# Patient Record
Sex: Female | Born: 1940
Health system: Southern US, Community
[De-identification: ages and names within clinical notes are randomized; demographics above are authoritative.]

## PROBLEM LIST (undated history)

## (undated) DIAGNOSIS — K760 Fatty (change of) liver, not elsewhere classified: Secondary | ICD-10-CM

## (undated) DIAGNOSIS — R270 Ataxia, unspecified: Secondary | ICD-10-CM

## (undated) DIAGNOSIS — I10 Essential (primary) hypertension: Secondary | ICD-10-CM

## (undated) DIAGNOSIS — K5790 Diverticulosis of intestine, part unspecified, without perforation or abscess without bleeding: Secondary | ICD-10-CM

## (undated) DIAGNOSIS — R269 Unspecified abnormalities of gait and mobility: Secondary | ICD-10-CM

## (undated) DIAGNOSIS — N281 Cyst of kidney, acquired: Secondary | ICD-10-CM

## (undated) DIAGNOSIS — E785 Hyperlipidemia, unspecified: Secondary | ICD-10-CM

## (undated) DIAGNOSIS — K219 Gastro-esophageal reflux disease without esophagitis: Secondary | ICD-10-CM

## (undated) DIAGNOSIS — M8580 Other specified disorders of bone density and structure, unspecified site: Secondary | ICD-10-CM

## (undated) DIAGNOSIS — Z9181 History of falling: Secondary | ICD-10-CM

## (undated) DIAGNOSIS — H5509 Other forms of nystagmus: Secondary | ICD-10-CM

## (undated) DIAGNOSIS — T7840XA Allergy, unspecified, initial encounter: Secondary | ICD-10-CM

## (undated) DIAGNOSIS — H269 Unspecified cataract: Secondary | ICD-10-CM

## (undated) DIAGNOSIS — D509 Iron deficiency anemia, unspecified: Secondary | ICD-10-CM

## (undated) HISTORY — DX: Unspecified cataract: H26.9

## (undated) HISTORY — DX: Iron deficiency anemia, unspecified: D50.9

## (undated) HISTORY — PX: CATARACT EXTRACTION: SUR2

## (undated) HISTORY — DX: Unspecified abnormalities of gait and mobility: R26.9

## (undated) HISTORY — DX: Essential (primary) hypertension: I10

## (undated) HISTORY — DX: History of falling: Z91.81

## (undated) HISTORY — DX: Ataxia, unspecified: R27.0

## (undated) HISTORY — DX: Gastro-esophageal reflux disease without esophagitis: K21.9

## (undated) HISTORY — DX: Fatty (change of) liver, not elsewhere classified: K76.0

## (undated) HISTORY — PX: TONSILLECTOMY: SUR1361

## (undated) HISTORY — DX: Cyst of kidney, acquired: N28.1

## (undated) HISTORY — DX: Other forms of nystagmus: H55.09

## (undated) HISTORY — DX: Other specified disorders of bone density and structure, unspecified site: M85.80

## (undated) HISTORY — DX: Allergy, unspecified, initial encounter: T78.40XA

## (undated) HISTORY — DX: Hyperlipidemia, unspecified: E78.5

## (undated) HISTORY — PX: HERNIA REPAIR: SHX51

## (undated) HISTORY — DX: Diverticulosis of intestine, part unspecified, without perforation or abscess without bleeding: K57.90

## (undated) HISTORY — PX: EYE SURGERY: SHX253

---

## 1969-07-22 HISTORY — PX: BREAST BIOPSY: SHX20

## 1999-02-23 ENCOUNTER — Ambulatory Visit (HOSPITAL_COMMUNITY): Admission: RE | Admit: 1999-02-23 | Discharge: 1999-02-23 | Payer: Self-pay | Admitting: Internal Medicine

## 1999-10-31 ENCOUNTER — Emergency Department (HOSPITAL_COMMUNITY): Admission: EM | Admit: 1999-10-31 | Discharge: 1999-10-31 | Payer: Self-pay | Admitting: Emergency Medicine

## 1999-12-09 ENCOUNTER — Other Ambulatory Visit: Admission: RE | Admit: 1999-12-09 | Discharge: 1999-12-09 | Payer: Self-pay | Admitting: Gynecology

## 2000-02-17 ENCOUNTER — Ambulatory Visit (HOSPITAL_COMMUNITY): Admission: RE | Admit: 2000-02-17 | Discharge: 2000-02-17 | Payer: Self-pay | Admitting: Orthopedic Surgery

## 2000-02-17 ENCOUNTER — Encounter: Payer: Self-pay | Admitting: Orthopedic Surgery

## 2001-02-14 ENCOUNTER — Ambulatory Visit (HOSPITAL_COMMUNITY): Admission: RE | Admit: 2001-02-14 | Discharge: 2001-02-14 | Payer: Self-pay | Admitting: Internal Medicine

## 2001-02-14 ENCOUNTER — Encounter: Payer: Self-pay | Admitting: Internal Medicine

## 2001-03-21 ENCOUNTER — Encounter: Payer: Self-pay | Admitting: Urology

## 2001-03-21 ENCOUNTER — Ambulatory Visit (HOSPITAL_COMMUNITY): Admission: RE | Admit: 2001-03-21 | Discharge: 2001-03-21 | Payer: Self-pay | Admitting: Urology

## 2002-12-28 ENCOUNTER — Encounter: Payer: Self-pay | Admitting: Internal Medicine

## 2002-12-28 ENCOUNTER — Encounter: Admission: RE | Admit: 2002-12-28 | Discharge: 2002-12-28 | Payer: Self-pay | Admitting: Internal Medicine

## 2003-01-23 ENCOUNTER — Other Ambulatory Visit: Admission: RE | Admit: 2003-01-23 | Discharge: 2003-01-23 | Payer: Self-pay | Admitting: Gynecology

## 2003-11-06 ENCOUNTER — Encounter: Admission: RE | Admit: 2003-11-06 | Discharge: 2003-11-06 | Payer: Self-pay | Admitting: Internal Medicine

## 2003-12-29 ENCOUNTER — Ambulatory Visit (HOSPITAL_COMMUNITY): Admission: RE | Admit: 2003-12-29 | Discharge: 2003-12-29 | Payer: Self-pay | Admitting: Internal Medicine

## 2004-01-13 ENCOUNTER — Emergency Department (HOSPITAL_COMMUNITY): Admission: EM | Admit: 2004-01-13 | Discharge: 2004-01-13 | Payer: Self-pay | Admitting: Emergency Medicine

## 2004-01-27 ENCOUNTER — Other Ambulatory Visit: Admission: RE | Admit: 2004-01-27 | Discharge: 2004-01-27 | Payer: Self-pay | Admitting: Gynecology

## 2004-01-28 ENCOUNTER — Other Ambulatory Visit: Admission: RE | Admit: 2004-01-28 | Discharge: 2004-01-28 | Payer: Self-pay | Admitting: Gynecology

## 2004-03-29 ENCOUNTER — Ambulatory Visit (HOSPITAL_COMMUNITY): Admission: RE | Admit: 2004-03-29 | Discharge: 2004-03-29 | Payer: Self-pay | Admitting: Internal Medicine

## 2004-03-30 ENCOUNTER — Encounter: Payer: Self-pay | Admitting: Internal Medicine

## 2004-08-09 ENCOUNTER — Other Ambulatory Visit: Admission: RE | Admit: 2004-08-09 | Discharge: 2004-08-09 | Payer: Self-pay | Admitting: Gynecology

## 2005-02-03 ENCOUNTER — Other Ambulatory Visit: Admission: RE | Admit: 2005-02-03 | Discharge: 2005-02-03 | Payer: Self-pay | Admitting: Gynecology

## 2005-04-05 ENCOUNTER — Ambulatory Visit: Payer: Self-pay | Admitting: Internal Medicine

## 2005-04-12 ENCOUNTER — Ambulatory Visit: Payer: Self-pay | Admitting: Internal Medicine

## 2005-08-02 ENCOUNTER — Ambulatory Visit: Payer: Self-pay | Admitting: Internal Medicine

## 2005-08-17 ENCOUNTER — Ambulatory Visit (HOSPITAL_COMMUNITY): Admission: RE | Admit: 2005-08-17 | Discharge: 2005-08-17 | Payer: Self-pay | Admitting: Internal Medicine

## 2005-09-13 ENCOUNTER — Ambulatory Visit: Payer: Self-pay | Admitting: Internal Medicine

## 2005-09-27 ENCOUNTER — Ambulatory Visit: Payer: Self-pay | Admitting: Internal Medicine

## 2006-02-19 ENCOUNTER — Encounter: Payer: Self-pay | Admitting: Internal Medicine

## 2006-02-19 LAB — CONVERTED CEMR LAB

## 2006-02-27 ENCOUNTER — Other Ambulatory Visit: Admission: RE | Admit: 2006-02-27 | Discharge: 2006-02-27 | Payer: Self-pay | Admitting: Gynecology

## 2006-04-14 ENCOUNTER — Ambulatory Visit: Payer: Self-pay | Admitting: Internal Medicine

## 2006-04-24 ENCOUNTER — Ambulatory Visit: Payer: Self-pay | Admitting: Internal Medicine

## 2006-04-26 ENCOUNTER — Ambulatory Visit: Payer: Self-pay | Admitting: Internal Medicine

## 2006-11-22 ENCOUNTER — Ambulatory Visit: Payer: Self-pay | Admitting: Internal Medicine

## 2006-11-29 ENCOUNTER — Ambulatory Visit: Payer: Self-pay | Admitting: Internal Medicine

## 2006-12-16 ENCOUNTER — Ambulatory Visit: Payer: Self-pay | Admitting: Family Medicine

## 2007-01-24 LAB — CONVERTED CEMR LAB: Pap Smear: NORMAL

## 2007-02-22 ENCOUNTER — Ambulatory Visit: Payer: Self-pay | Admitting: Internal Medicine

## 2007-06-16 ENCOUNTER — Encounter: Payer: Self-pay | Admitting: Internal Medicine

## 2007-06-16 DIAGNOSIS — K219 Gastro-esophageal reflux disease without esophagitis: Secondary | ICD-10-CM | POA: Insufficient documentation

## 2007-06-16 DIAGNOSIS — D509 Iron deficiency anemia, unspecified: Secondary | ICD-10-CM | POA: Insufficient documentation

## 2007-06-16 DIAGNOSIS — M949 Disorder of cartilage, unspecified: Secondary | ICD-10-CM

## 2007-06-16 DIAGNOSIS — J45909 Unspecified asthma, uncomplicated: Secondary | ICD-10-CM | POA: Insufficient documentation

## 2007-06-16 DIAGNOSIS — M899 Disorder of bone, unspecified: Secondary | ICD-10-CM | POA: Insufficient documentation

## 2007-06-18 ENCOUNTER — Ambulatory Visit: Payer: Self-pay | Admitting: Internal Medicine

## 2007-09-19 ENCOUNTER — Ambulatory Visit: Payer: Self-pay | Admitting: Internal Medicine

## 2007-10-16 ENCOUNTER — Ambulatory Visit: Payer: Self-pay | Admitting: Internal Medicine

## 2007-10-16 LAB — CONVERTED CEMR LAB
ALT: 20 units/L (ref 0–35)
AST: 27 units/L (ref 0–37)
Albumin: 4 g/dL (ref 3.5–5.2)
Alkaline Phosphatase: 47 units/L (ref 39–117)
BUN: 10 mg/dL (ref 6–23)
Bacteria, UA: NEGATIVE
Basophils Absolute: 0.1 10*3/uL (ref 0.0–0.1)
Basophils Relative: 0.9 % (ref 0.0–1.0)
Bilirubin Urine: NEGATIVE
Bilirubin, Direct: 0.2 mg/dL (ref 0.0–0.3)
CO2: 28 meq/L (ref 19–32)
Calcium: 9.5 mg/dL (ref 8.4–10.5)
Chloride: 106 meq/L (ref 96–112)
Cholesterol: 192 mg/dL (ref 0–200)
Creatinine, Ser: 0.8 mg/dL (ref 0.4–1.2)
Crystals: NEGATIVE
Eosinophils Absolute: 0.1 10*3/uL (ref 0.0–0.6)
Eosinophils Relative: 1.6 % (ref 0.0–5.0)
GFR calc Af Amer: 93 mL/min
GFR calc non Af Amer: 77 mL/min
Glucose, Bld: 91 mg/dL (ref 70–99)
HCT: 39.4 % (ref 36.0–46.0)
HDL: 66.8 mg/dL (ref >39.0)
Hemoglobin: 13.5 g/dL (ref 12.0–15.0)
Ketones, ur: NEGATIVE mg/dL
LDL Cholesterol: 115 mg/dL — ABNORMAL HIGH (ref 0–99)
Leukocytes, UA: NEGATIVE
Lymphocytes Relative: 25.1 % (ref 12.0–46.0)
MCHC: 34.1 g/dL (ref 30.0–36.0)
MCV: 94.7 fL (ref 78.0–100.0)
Monocytes Absolute: 0.5 10*3/uL (ref 0.2–0.7)
Monocytes Relative: 8.8 % (ref 3.0–11.0)
Mucus, UA: NEGATIVE
Neutro Abs: 3.6 10*3/uL (ref 1.4–7.7)
Neutrophils Relative %: 63.6 % (ref 43.0–77.0)
Nitrite: NEGATIVE
Platelets: 281 10*3/uL (ref 150–400)
Potassium: 4.1 meq/L (ref 3.5–5.1)
RBC: 4.17 M/uL (ref 3.87–5.11)
RDW: 12.5 % (ref 11.5–14.6)
Sodium: 142 meq/L (ref 135–145)
Specific Gravity, Urine: 1.02 (ref 1.000–1.03)
TSH: 2.06 microintl units/mL (ref 0.35–5.50)
Total Bilirubin: 1.8 mg/dL — ABNORMAL HIGH (ref 0.3–1.2)
Total CHOL/HDL Ratio: 2.9
Total Protein, Urine: NEGATIVE mg/dL
Total Protein: 6.9 g/dL (ref 6.0–8.3)
Triglycerides: 53 mg/dL (ref 0–149)
Urine Glucose: NEGATIVE mg/dL
Urobilinogen, UA: 0.2 (ref 0.0–1.0)
VLDL: 11 mg/dL (ref 0–40)
WBC: 5.7 10*3/uL (ref 4.5–10.5)
pH: 6 (ref 5.0–8.0)

## 2007-10-17 ENCOUNTER — Encounter: Payer: Self-pay | Admitting: Internal Medicine

## 2007-10-23 ENCOUNTER — Ambulatory Visit: Payer: Self-pay | Admitting: Internal Medicine

## 2007-12-26 ENCOUNTER — Ambulatory Visit: Payer: Self-pay | Admitting: Internal Medicine

## 2007-12-26 ENCOUNTER — Telehealth: Payer: Self-pay | Admitting: Internal Medicine

## 2007-12-26 LAB — CONVERTED CEMR LAB
Bilirubin Urine: NEGATIVE
Crystals: NEGATIVE
Ketones, ur: NEGATIVE mg/dL
Mucus, UA: NEGATIVE
Nitrite: NEGATIVE
Specific Gravity, Urine: >=1.03 (ref 1.000–1.03)
Total Protein, Urine: NEGATIVE mg/dL
Urine Glucose: NEGATIVE mg/dL
Urobilinogen, UA: 0.2 (ref 0.0–1.0)
pH: 5 (ref 5.0–8.0)

## 2008-01-15 ENCOUNTER — Telehealth: Payer: Self-pay | Admitting: Internal Medicine

## 2008-01-18 ENCOUNTER — Ambulatory Visit: Payer: Self-pay | Admitting: Internal Medicine

## 2008-01-22 ENCOUNTER — Ambulatory Visit: Payer: Self-pay | Admitting: Internal Medicine

## 2008-01-22 ENCOUNTER — Encounter: Payer: Self-pay | Admitting: Internal Medicine

## 2008-01-30 LAB — CONVERTED CEMR LAB: Pap Smear: NORMAL

## 2008-06-10 ENCOUNTER — Telehealth: Payer: Self-pay | Admitting: Internal Medicine

## 2008-06-12 ENCOUNTER — Ambulatory Visit (HOSPITAL_COMMUNITY): Admission: RE | Admit: 2008-06-12 | Discharge: 2008-06-12 | Payer: Self-pay | Admitting: Internal Medicine

## 2008-06-13 ENCOUNTER — Telehealth: Payer: Self-pay | Admitting: Internal Medicine

## 2008-06-26 ENCOUNTER — Encounter: Payer: Self-pay | Admitting: Internal Medicine

## 2008-07-07 ENCOUNTER — Encounter: Payer: Self-pay | Admitting: Internal Medicine

## 2008-08-14 DIAGNOSIS — N281 Cyst of kidney, acquired: Secondary | ICD-10-CM | POA: Insufficient documentation

## 2008-08-14 DIAGNOSIS — K5730 Diverticulosis of large intestine without perforation or abscess without bleeding: Secondary | ICD-10-CM | POA: Insufficient documentation

## 2008-08-19 ENCOUNTER — Ambulatory Visit: Payer: Self-pay | Admitting: Internal Medicine

## 2008-08-26 ENCOUNTER — Telehealth: Payer: Self-pay | Admitting: Internal Medicine

## 2008-08-27 ENCOUNTER — Encounter: Payer: Self-pay | Admitting: Internal Medicine

## 2008-08-27 ENCOUNTER — Ambulatory Visit: Payer: Self-pay | Admitting: Internal Medicine

## 2008-09-11 ENCOUNTER — Ambulatory Visit: Payer: Self-pay | Admitting: Internal Medicine

## 2008-10-05 ENCOUNTER — Telehealth: Payer: Self-pay | Admitting: Internal Medicine

## 2008-10-09 ENCOUNTER — Emergency Department (HOSPITAL_COMMUNITY): Admission: EM | Admit: 2008-10-09 | Discharge: 2008-10-09 | Payer: Self-pay | Admitting: *Deleted

## 2008-10-21 ENCOUNTER — Ambulatory Visit: Payer: Self-pay | Admitting: Internal Medicine

## 2008-10-21 DIAGNOSIS — J438 Other emphysema: Secondary | ICD-10-CM | POA: Insufficient documentation

## 2008-10-21 DIAGNOSIS — M5412 Radiculopathy, cervical region: Secondary | ICD-10-CM | POA: Insufficient documentation

## 2008-10-28 ENCOUNTER — Encounter: Admission: RE | Admit: 2008-10-28 | Discharge: 2008-10-28 | Payer: Self-pay | Admitting: Internal Medicine

## 2008-11-03 ENCOUNTER — Encounter: Payer: Self-pay | Admitting: Internal Medicine

## 2008-11-03 ENCOUNTER — Telehealth: Payer: Self-pay | Admitting: Internal Medicine

## 2008-11-17 ENCOUNTER — Ambulatory Visit: Payer: Self-pay | Admitting: Internal Medicine

## 2008-11-17 DIAGNOSIS — R279 Unspecified lack of coordination: Secondary | ICD-10-CM | POA: Insufficient documentation

## 2008-11-21 HISTORY — PX: CERVICAL SPINE SURGERY: SHX589

## 2008-11-26 ENCOUNTER — Telehealth: Payer: Self-pay | Admitting: Internal Medicine

## 2008-11-26 ENCOUNTER — Encounter: Payer: Self-pay | Admitting: Internal Medicine

## 2008-11-26 LAB — CONVERTED CEMR LAB
Cholesterol: 205 mg/dL — ABNORMAL HIGH (ref 0–200)
HDL: 79 mg/dL (ref >39)
LDL Cholesterol: 114 mg/dL — ABNORMAL HIGH (ref 0–99)
Total CHOL/HDL Ratio: 2.6
Triglycerides: 61 mg/dL (ref <150)
VLDL: 12 mg/dL (ref 0–40)

## 2008-11-27 ENCOUNTER — Encounter: Payer: Self-pay | Admitting: Internal Medicine

## 2008-11-28 ENCOUNTER — Inpatient Hospital Stay (HOSPITAL_COMMUNITY): Admission: RE | Admit: 2008-11-28 | Discharge: 2008-11-30 | Payer: Self-pay | Admitting: Neurosurgery

## 2008-12-25 ENCOUNTER — Encounter: Payer: Self-pay | Admitting: Internal Medicine

## 2009-01-07 ENCOUNTER — Encounter (INDEPENDENT_AMBULATORY_CARE_PROVIDER_SITE_OTHER): Payer: Self-pay | Admitting: *Deleted

## 2009-01-20 ENCOUNTER — Encounter: Payer: Self-pay | Admitting: Internal Medicine

## 2009-02-25 ENCOUNTER — Ambulatory Visit: Payer: Self-pay | Admitting: Internal Medicine

## 2009-03-12 ENCOUNTER — Ambulatory Visit: Payer: Self-pay | Admitting: Internal Medicine

## 2009-04-16 ENCOUNTER — Telehealth (INDEPENDENT_AMBULATORY_CARE_PROVIDER_SITE_OTHER): Payer: Self-pay | Admitting: *Deleted

## 2009-04-16 ENCOUNTER — Ambulatory Visit: Payer: Self-pay | Admitting: Internal Medicine

## 2009-04-16 DIAGNOSIS — L03818 Cellulitis of other sites: Secondary | ICD-10-CM

## 2009-04-16 DIAGNOSIS — L02818 Cutaneous abscess of other sites: Secondary | ICD-10-CM | POA: Insufficient documentation

## 2009-04-30 ENCOUNTER — Ambulatory Visit: Payer: Self-pay | Admitting: Internal Medicine

## 2009-05-04 ENCOUNTER — Encounter: Payer: Self-pay | Admitting: Internal Medicine

## 2009-05-28 ENCOUNTER — Ambulatory Visit: Payer: Self-pay | Admitting: Internal Medicine

## 2009-08-18 ENCOUNTER — Ambulatory Visit: Payer: Self-pay | Admitting: Internal Medicine

## 2009-08-18 DIAGNOSIS — M25529 Pain in unspecified elbow: Secondary | ICD-10-CM | POA: Insufficient documentation

## 2009-08-18 LAB — CONVERTED CEMR LAB
Basophils Absolute: 0 10*3/uL (ref 0.0–0.1)
Basophils Relative: 0.5 % (ref 0.0–3.0)
Eosinophils Absolute: 0.1 10*3/uL (ref 0.0–0.7)
Eosinophils Relative: 1.6 % (ref 0.0–5.0)
HCT: 38.9 % (ref 36.0–46.0)
Hemoglobin: 13.3 g/dL (ref 12.0–15.0)
Lymphocytes Relative: 27 % (ref 12.0–46.0)
Lymphs Abs: 1.5 10*3/uL (ref 0.7–4.0)
MCHC: 34.1 g/dL (ref 30.0–36.0)
MCV: 96.3 fL (ref 78.0–100.0)
Monocytes Absolute: 0.4 10*3/uL (ref 0.1–1.0)
Monocytes Relative: 6.9 % (ref 3.0–12.0)
Neutro Abs: 3.7 10*3/uL (ref 1.4–7.7)
Neutrophils Relative %: 64 % (ref 43.0–77.0)
Platelets: 236 10*3/uL (ref 150.0–400.0)
RBC: 4.04 M/uL (ref 3.87–5.11)
RDW: 12.1 % (ref 11.5–14.6)
Uric Acid, Serum: 4.6 mg/dL (ref 2.4–7.0)
WBC: 5.7 10*3/uL (ref 4.5–10.5)

## 2009-08-19 ENCOUNTER — Telehealth (INDEPENDENT_AMBULATORY_CARE_PROVIDER_SITE_OTHER): Payer: Self-pay | Admitting: *Deleted

## 2009-12-03 ENCOUNTER — Ambulatory Visit: Payer: Self-pay | Admitting: Internal Medicine

## 2010-01-29 ENCOUNTER — Encounter: Payer: Self-pay | Admitting: Internal Medicine

## 2010-03-11 ENCOUNTER — Telehealth: Payer: Self-pay | Admitting: Internal Medicine

## 2010-03-19 ENCOUNTER — Ambulatory Visit: Payer: Self-pay | Admitting: Internal Medicine

## 2010-03-19 DIAGNOSIS — M25559 Pain in unspecified hip: Secondary | ICD-10-CM | POA: Insufficient documentation

## 2010-03-19 DIAGNOSIS — L84 Corns and callosities: Secondary | ICD-10-CM | POA: Insufficient documentation

## 2010-08-26 ENCOUNTER — Telehealth: Payer: Self-pay | Admitting: Internal Medicine

## 2010-09-01 ENCOUNTER — Telehealth: Payer: Self-pay | Admitting: Internal Medicine

## 2010-09-07 ENCOUNTER — Telehealth (INDEPENDENT_AMBULATORY_CARE_PROVIDER_SITE_OTHER): Payer: Self-pay | Admitting: *Deleted

## 2010-09-09 ENCOUNTER — Ambulatory Visit: Payer: Self-pay | Admitting: Internal Medicine

## 2010-09-21 ENCOUNTER — Ambulatory Visit: Payer: Self-pay | Admitting: Internal Medicine

## 2010-09-21 ENCOUNTER — Encounter: Payer: Self-pay | Admitting: Internal Medicine

## 2010-10-05 ENCOUNTER — Encounter: Payer: Self-pay | Admitting: Internal Medicine

## 2010-11-23 ENCOUNTER — Ambulatory Visit
Admission: RE | Admit: 2010-11-23 | Discharge: 2010-11-23 | Payer: Self-pay | Source: Home / Self Care | Attending: Internal Medicine | Admitting: Internal Medicine

## 2010-12-07 ENCOUNTER — Encounter: Payer: Self-pay | Admitting: Internal Medicine

## 2010-12-07 ENCOUNTER — Other Ambulatory Visit: Payer: Self-pay | Admitting: Internal Medicine

## 2010-12-07 ENCOUNTER — Ambulatory Visit
Admission: RE | Admit: 2010-12-07 | Discharge: 2010-12-07 | Payer: Self-pay | Source: Home / Self Care | Attending: Internal Medicine | Admitting: Internal Medicine

## 2010-12-07 DIAGNOSIS — R252 Cramp and spasm: Secondary | ICD-10-CM | POA: Insufficient documentation

## 2010-12-07 LAB — CBC WITH DIFFERENTIAL/PLATELET
Basophils Absolute: 0 10*3/uL (ref 0.0–0.1)
Basophils Relative: 0.4 % (ref 0.0–3.0)
Eosinophils Absolute: 0.1 10*3/uL (ref 0.0–0.7)
Eosinophils Relative: 1 % (ref 0.0–5.0)
HCT: 38 % (ref 36.0–46.0)
Hemoglobin: 13.3 g/dL (ref 12.0–15.0)
Lymphocytes Relative: 20.9 % (ref 12.0–46.0)
Lymphs Abs: 1.3 10*3/uL (ref 0.7–4.0)
MCHC: 34.9 g/dL (ref 30.0–36.0)
MCV: 94.6 fl (ref 78.0–100.0)
Monocytes Absolute: 0.6 10*3/uL (ref 0.1–1.0)
Monocytes Relative: 9.1 % (ref 3.0–12.0)
Neutro Abs: 4.3 10*3/uL (ref 1.4–7.7)
Neutrophils Relative %: 68.6 % (ref 43.0–77.0)
Platelets: 261 10*3/uL (ref 150.0–400.0)
RBC: 4.02 Mil/uL (ref 3.87–5.11)
RDW: 13.9 % (ref 11.5–14.6)
WBC: 6.3 10*3/uL (ref 4.5–10.5)

## 2010-12-07 LAB — LDL CHOLESTEROL, DIRECT: Direct LDL: 129.3 mg/dL

## 2010-12-07 LAB — LIPID PANEL
Cholesterol: 244 mg/dL — ABNORMAL HIGH (ref 0–200)
HDL: 97.1 mg/dL (ref >39.00)
Total CHOL/HDL Ratio: 3
Triglycerides: 142 mg/dL (ref 0.0–149.0)
VLDL: 28.4 mg/dL (ref 0.0–40.0)

## 2010-12-07 LAB — BASIC METABOLIC PANEL
BUN: 14 mg/dL (ref 6–23)
CO2: 30 mEq/L (ref 19–32)
Calcium: 9.5 mg/dL (ref 8.4–10.5)
Chloride: 103 mEq/L (ref 96–112)
Creatinine, Ser: 0.7 mg/dL (ref 0.4–1.2)
GFR: 94.49 mL/min (ref >60.00)
Glucose, Bld: 107 mg/dL — ABNORMAL HIGH (ref 70–99)
Potassium: 3.8 mEq/L (ref 3.5–5.1)
Sodium: 141 mEq/L (ref 135–145)

## 2010-12-07 LAB — MAGNESIUM: Magnesium: 2.1 mg/dL (ref 1.5–2.5)

## 2010-12-21 NOTE — Assessment & Plan Note (Signed)
Summary: cpx / medicare / # / cd   Vital Signs:  Patient profile:   70 year old female Height:      60 inches Weight:      95 pounds BMI:     18.62 O2 Sat:      98 % on Room air Temp:     97.8 degrees F oral Pulse rate:   74 / minute BP sitting:   140 / 90  (left arm) Cuff size:   regular  Vitals Entered By: Bill Salinas CMA (December 03, 2009 10:23 AM)  O2 Flow:  Room air CC: pt here for yearly check up. She is due for tetanus and has never had zostavax injection. Pt's last bone density screening was oct 2009 which showed osteopenia and recommended follow up bone density in oct 2011. Pt's last eye exam was Feb 2010 and she is sch for an eye exam Dec 29, 2009 with Dr [name] Sanes ab   Primary Care Provider:  Jacques Navy MD  CC:  pt here for yearly check up. She is due for tetanus and has never had zostavax injection. Pt's last bone density screening was oct 2009 which showed osteopenia and recommended follow up bone density in oct 2011. Pt's last eye exam was Feb 2010 and she is sch for an eye exam feb 8 and 2011 with Dr [name] Sanes ab.  History of Present Illness: Patient prsents for routine follow-up. She has been great. She has had her first grandson-born Nov '10. She is current with mammography, current with pelvic exam and had a PAP in '09, last colonoscopy '10.  Current Medications (verified): 1)  Alendronate Sodium 70 Mg Tabs (Alendronate Sodium) .Marland Kitchen.. 1 Weekly 2)  Femhrt Low Dose 0.5-2.5 Mg-Mcg  Tabs (Norethindrone-Eth Estradiol) .... Once Daily 3)  Albuterol 90 Mcg/act  Aers (Albuterol) .... As Needed 4)  Centrum Silver  Tabs (Multiple Vitamins-Minerals) .... Once Daily 5)  Aspirin Ec Low Dose 81 Mg  Tbec (Aspirin) .Marland Kitchen.. 1 Once Daily 6)  Caltrate 600+d 600-400 Mg-Unit  Tabs (Calcium Carbonate-Vitamin D) .... Two Times A Day 7)  Omeprazole 40 Mg Cpdr (Omeprazole) .... Take 1 Tablet By Mouth Once A Day  Allergies (verified): 1)  * Ct Dye  Past History:  Past Surgical  History: Tonsillectomy Breast biopsies- both benign C-spine surgery '10 [facility])  Family History: Reviewed history from 08/14/2008 and no changes required. father-died 14, colon cancer, CAD/MI mother -died 68, pancreatic cancer,  Paternal aunt, paternal GM with colon cancer paternal aunt - breast cancer maternal aunt with ovarian cancer Family History of Heart Disease: Father Family History of Ovarian Cancer: Maternal Aunt  Social History: Reviewed history from 08/14/2008 and no changes required. 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 married - 1965 3 sons - '66, '69, '70 Son in good health Patient has never smoked.  Alcohol Use - no Daily Caffeine Use-2-3 daily Illicit Drug Use - no  Review of Systems  The patient denies anorexia, fever, weight loss, weight gain, vision loss, decreased hearing, chest pain, syncope, peripheral edema, prolonged cough, hemoptysis, abdominal pain, hematochezia, hematuria, muscle weakness, suspicious skin lesions, difficulty walking, depression, abnormal bleeding, and angioedema.    Physical Exam  General:  slender white female in no distress Head:  Normocephalic and atraumatic without obvious abnormalities. No apparent alopecia or balding. Eyes:  No corneal or conjunctival inflammation noted. EOMI. Perrla. Funduscopic exam benign, without hemorrhages, exudates or papilledema. Vision grossly normal. Ears:  External ear  exam shows no significant lesions or deformities.  Otoscopic examination reveals clear canals, tympanic membranes are intact bilaterally without bulging, retraction, inflammation or discharge. Hearing is grossly normal bilaterally. Nose:  no external deformity and no external erythema.   Mouth:  Oral mucosa and oropharynx without lesions or exudates.  Teeth in good repair. Neck:  full ROM, no thyromegaly, and no carotid bruits.   Breasts:  deferred Lungs:  normal respiratory effort, normal breath sounds, no  crackles, and no wheezes.   Heart:  Normal rate and regular rhythm. S1 and S2 normal without gallop, murmur, click, rub or other extra sounds. Abdomen:  soft, non-tender, normal bowel sounds, no distention, no guarding, no rebound tenderness, and no hepatomegaly.   Rectal:  deferred Msk:  normal ROM, no joint tenderness, no joint swelling, and no joint deformities.   Pulses:  2+ radial and DP pulses Extremities:  No clubbing, cyanosis, edema, or deformity noted with normal full range of motion of all joints.   Neurologic:  alert & oriented X3, cranial nerves II-XII intact, strength normal in all extremities, gait normal, and DTRs symmetrical and normal.   Skin:  turgor normal, color normal, no rashes, no petechiae, no ulcerations, and no edema.  Well healed anterior cervical surgical scar. Cervical Nodes:  no anterior cervical adenopathy and no posterior cervical adenopathy.   Psych:  Oriented X3, memory intact for recent and remote, normally interactive, good eye contact, and not anxious appearing.     Impression & Recommendations:  Problem # 1:  ATAXIA (ICD-781.3) Mild persistent symptoms that have improved. No further eval at this time  Problem # 2:  EMPHYSEMA (ICD-492.8) Stable without symptoms. More of a radiographic finding than physical finding. No further work-up or treatment indicated.  Problem # 3:  OSTEOPENIA (ICD-733.90) Due for follow-up DXA in October '11. Will continue alendronate.  Her updated medication list for this problem includes:    Alendronate Sodium 70 Mg Tabs (Alendronate sodium) .Marland Kitchen... 1 weekly  Problem # 4:  GERD (ICD-530.81) Well controlled by omeprazole.  Her updated medication list for this problem includes:    Omeprazole 40 Mg Cpdr (Omeprazole) .Marland Kitchen... Take 1 tablet by mouth once a day  Problem # 5:  ANEMIA-IRON DEFICIENCY (ICD-280.9) Last Hemoglobin 08/18/09 13.3g = normal. No further eval required.  Problem # 6:  Preventive Health Care  (ICD-V70.0) Unremarkable history, having made a full recovery from her DDD cervical spine with surgery. She is feeling well. Normal physical exam. Chart reviewed - she had a normal lipid panel in the last 18 months and odes not need repeat for 2-3 years, Uric acid and blood count in September'10 and stable chemistries, thus no lab ordered today. She is due for DXA as noted. She will schedule her own mammogram. She is current with colorectal cancer screening. Immunization agains flu and pneumonia are up to date.  In summary - a very nice new grandmother who is medical stable and doing well. She will return for DXA in October and to see me in 1 year or as needed.   Complete Medication List: 1)  Alendronate Sodium 70 Mg Tabs (Alendronate sodium) .Marland KitchenMarland KitchenMarland Kitchen 1 weekly 2)  Femhrt Low Dose 0.5-2.5 Mg-mcg Tabs (Norethindrone-eth estradiol) .... Once daily 3)  Albuterol 90 Mcg/act Aers (Albuterol) .... As needed 4)  Centrum Silver Tabs (Multiple vitamins-minerals) .... Once daily 5)  Aspirin Ec Low Dose 81 Mg Tbec (Aspirin) .Marland Kitchen.. 1 once daily 6)  Caltrate 600+d 600-400 Mg-unit Tabs (Calcium carbonate-vitamin d) .... Two  times a day 7)  Omeprazole 40 Mg Cpdr (Omeprazole) .... Take 1 tablet by mouth once a day   Preventive Care Screening  Mammogram:    Date:  12/31/2008    Results:  normal bilateral   Bone Density:    Date:  08/27/2008    Results:  abnormal std dev  Pap Smear:    Date:  01/30/2008    Results:  normal    Preventive Care Screening  Mammogram:    Date:  12/31/2008    Results:  normal bilateral   Bone Density:    Date:  08/27/2008    Results:  abnormal std dev  Pap Smear:    Date:  01/30/2008    Results:  normal

## 2010-12-21 NOTE — Letter (Signed)
Summary: [facility]  [facility]   Imported By: Lester [place] 05/19/2009 09:10:50  _____________________________________________________________________  External Attachment:    Type:   Image     Comment:   External Document

## 2010-12-21 NOTE — Progress Notes (Signed)
Summary: BONE DENSITY  Phone Note Call from Patient   Summary of Call: Patient is requesting order for bone density, ok per DR, pt scheduled for tomorrow  Initial call taken by: Lamar Sprinkles,  August 26, 2008 5:06 PM

## 2010-12-21 NOTE — Assessment & Plan Note (Signed)
Summary: 1 mos f/u $50 / cd   Vital Signs:  Patient profile:   70 year old female Height:      60 inches Weight:      90 pounds BMI:     17.64 O2 Sat:      98 % on Room air Temp:     97.7 degrees F oral Pulse rate:   78 / minute Pulse rhythm:   regular BP sitting:   126 / 82  (left arm) Cuff size:   regular  Vitals Entered By: Rock Nephew CMA (May 28, 2009 10:22 AM)  O2 Flow:  Room air  Primary Care Provider:  Jacques Navy MD   History of Present Illness: she returns for followup. On June 20 of this year she had one episode of nausea and vomiting after taking rifampin. She ended up completing a 10 day course and has had no abdominal symptoms since then. All of her rash and boils have resolved. She feels well today and offers no complaints.  Current Medications (verified): 1)  Alendronate Sodium 70 Mg Tabs (Alendronate Sodium) .Marland Kitchen.. 1 Weekly 2)  Femhrt Low Dose 0.5-2.5 Mg-Mcg  Tabs (Norethindrone-Eth Estradiol) .... Once Daily 3)  Albuterol 90 Mcg/act  Aers (Albuterol) .... As Needed 4)  Centrum Silver  Tabs (Multiple Vitamins-Minerals) .... Once Daily 5)  Aspirin Ec Low Dose 81 Mg  Tbec (Aspirin) .Marland Kitchen.. 1 Once Daily 6)  Caltrate 600+d 600-400 Mg-Unit  Tabs (Calcium Carbonate-Vitamin D) .... Two Times A Day 7)  Omeprazole 40 Mg Cpdr (Omeprazole) .... Take 1 Tablet By Mouth Once A Day  Allergies (verified): 1)  * Ct Dye  Past History:  Past Medical History: Reviewed history from 11/17/2008 and no changes required. ATAXIA (ICD-781.3) CERVICAL RADICULOPATHY, RIGHT (ICD-723.4) EMPHYSEMA (ICD-492.8) DIVERTICULOSIS OF COLON (ICD-562.10) RENAL CYST (ICD-593.2) OSTEOPENIA (ICD-733.90) GERD (ICD-530.81) ASTHMA (ICD-493.90) ANEMIA-IRON DEFICIENCY (ICD-280.9)  Past Surgical History: Reviewed history from 08/14/2008 and no changes required. Tonsillectomy Breast biopsies- both benign  Family History: Reviewed history from 08/14/2008 and no changes  required. father-died 63, colon cancer, CAD/MI mother -died 44, pancreatic cancer,  Paternal aunt, paternal GM with colon cancer paternal aunt - breast cancer maternal aunt with ovarian cancer Family History of Heart Disease: Father Family History of Ovarian Cancer: Maternal Aunt  Social History: Reviewed history from 08/14/2008 and no changes required. 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 married - 1965 3 sons - '66, '69, '70 Son in good health Patient has never smoked.  Alcohol Use - no Daily Caffeine Use-2-3 daily Illicit Drug Use - no  Review of Systems  The patient denies anorexia, fever, weight loss, abdominal pain, suspicious skin lesions, and enlarged lymph nodes.    Physical Exam  General:  alert, well-developed, well-nourished, and well-hydrated.   Neck:  supple, full ROM, and no masses.   Lungs:  Normal respiratory effort, chest expands symmetrically. Lungs are clear to auscultation, no crackles or wheezes. Heart:  Normal rate and regular rhythm. S1 and S2 normal without gallop, murmur, click, rub or other extra sounds. Abdomen:  soft, non-tender, normal bowel sounds, no distention, no masses, no hepatomegaly, and no splenomegaly.   Msk:  No deformity or scoliosis noted of thoracic or lumbar spine.   Skin:  no rashes, no suspicious lesions, excessive tan, and solar damage.   Cervical Nodes:  No lymphadenopathy noted Psych:  Cognition and judgment appear intact. Alert and cooperative with normal attention span and concentration. No apparent delusions,  illusions, hallucinations   Impression & Recommendations:  Problem # 1:  CELLULITIS AND ABSCESS OF OTHER SPECIFIED SITE [phone]) Assessment Improved  The following medications were removed from the medication list:    Rifampin 150 Mg Caps (Rifampin) ..... One by mouth two times a day for 10 days    Bactrim Ds 800-160 Mg Tabs (Sulfamethoxazole-trimethoprim) ..... One by mouth two times a  day for 10 days  Complete Medication List: 1)  Alendronate Sodium 70 Mg Tabs (Alendronate sodium) .Marland KitchenMarland KitchenMarland Kitchen 1 weekly 2)  Femhrt Low Dose 0.5-2.5 Mg-mcg Tabs (Norethindrone-eth estradiol) .... Once daily 3)  Albuterol 90 Mcg/act Aers (Albuterol) .... As needed 4)  Centrum Silver Tabs (Multiple vitamins-minerals) .... Once daily 5)  Aspirin Ec Low Dose 81 Mg Tbec (Aspirin) .Marland Kitchen.. 1 once daily 6)  Caltrate 600+d 600-400 Mg-unit Tabs (Calcium carbonate-vitamin d) .... Two times a day 7)  Omeprazole 40 Mg Cpdr (Omeprazole) .... Take 1 tablet by mouth once a day  Patient Instructions: 1)  Please schedule a follow-up appointment as needed.

## 2010-12-21 NOTE — Assessment & Plan Note (Signed)
Summary: PER PT ER/10/09/08-R SHOULDER PAIN-$50---STC   Vital Signs:  Patient Profile:   70 Years Old Female Height:     60 inches Weight:      99 pounds Temp:     97.8 degrees F oral Pulse rate:   70 / minute BP sitting:   150 / 90  (left arm) Cuff size:   regular  Vitals Entered By: Zackery Barefoot CMA (October 21, 2008 4:33 PM)                 PCP:  [facility]  Chief Complaint:  ER follow up .  History of Present Illness: Patient seen in ER 11/19 or an episode of acute pain in the right shoulder while on the commode, accompanied by sweating and radiation of pain down the arm. Her eval was reviewed: labs were normal, cardiac enzymes were negative, x-ray of shoulder was normal, C-Spine series with DJD and DDD and question of right sided foraminal compromise C5-6. Chest-x-ray with severe emphysema. She does recall two episodes that preceeded this event: climbing into a dog lot and taking a fall. She does admit to having decreased grip strength right hand.     Prior Medications Reviewed Using: Patient Recall  Updated Prior Medication List: FOSAMAX 70 MG  TABS (ALENDRONATE SODIUM) 1 weekly FEMHRT LOW DOSE 0.5-2.5 MG-MCG  TABS (NORETHINDRONE-ETH ESTRADIOL) once daily ALBUTEROL 90 MCG/ACT  AERS (ALBUTEROL) as needed CENTRUM SILVER  TABS (MULTIPLE VITAMINS-MINERALS) once daily ASPIRIN EC LOW DOSE 81 MG  TBEC (ASPIRIN) 1 once daily CALTRATE 600+D 600-400 MG-UNIT  TABS (CALCIUM CARBONATE-VITAMIN D) two times a day OMEPRAZOLE 40 MG CPDR (OMEPRAZOLE) Take 1 tablet by mouth once a day VITAMIN D 400 UNIT TABS (CHOLECALCIFEROL) Take 1 tablet by mouth once a day  Current Allergies (reviewed today): * CT DYE  Past Medical History:    Reviewed history from 10/23/2007 and no changes required:       Anemia-iron deficiency       Asthma as a child       GERD       Osteopenia with osteoporosis of the femoral neck       Renal cyst       ataxia-peripherally mediated        emphysema  Past Surgical History:    Reviewed history from 08/14/2008 and no changes required:       Tonsillectomy       Breast biopsies- both benign   Family History:    Reviewed history from 08/14/2008 and no changes required:       father-died 71, colon cancer, CAD/MI       mother -died 35, pancreatic cancer,        Paternal aunt, paternal GM with colon cancer       paternal aunt - breast cancer       maternal aunt with ovarian cancer       Family History of Heart Disease: Father       Family History of Ovarian Cancer: Maternal Aunt  Social History:    Reviewed history from 08/14/2008 and no changes required:       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       married - 1965       3 sons - '66, '69, '70       Son in good health       Patient has never smoked.  Alcohol Use - no       Daily Caffeine Use-2-3 daily       Illicit Drug Use - no    Review of Systems  The patient denies anorexia, fever, weight loss, weight gain, decreased hearing, hoarseness, chest pain, dyspnea on exertion, prolonged cough, abdominal pain, incontinence, and unusual weight change.         c/o decreased grip strength right hand   Physical Exam  General:     Thin but well nourished and strong white female Head:     Normocephalic and atraumatic without obvious abnormalities. No apparent alopecia or balding. Lungs:     normal respiratory effort.   Heart:     normal rate.   Neurologic:     decreased grip strength right vs left, decreased pinch strenght right vs left. Normal DTRs UE, decreased sensation to light touch and pin-prick distal index and third fingers right hand.    Impression & Recommendations:  Problem # 1:  CERVICAL RADICULOPATHY, RIGHT (ICD-723.4) patient with episode of severe right shoulder/arm pain now better. However C-spine suggested foraminal narrowing at C5-6. She does report weakness with right grip and this is found to be the case  on physical exam. suspect nerve compression syndrom c5-6 right.  Plan: MRI cervical spine.  Orders: Radiology Referral (Radiology)   Problem # 2:  CHEST XRAY, ABNORMAL (ICD-793.1) Chest x-ray read out as showing emphysema. Patient without symptoms. Offered PFTs but she doesn't feel this is needed at this time.  Complete Medication List: 1)  Fosamax 70 Mg Tabs (Alendronate sodium) .Marland KitchenMarland KitchenMarland Kitchen 1 weekly 2)  Femhrt Low Dose 0.5-2.5 Mg-mcg Tabs (Norethindrone-eth estradiol) .... Once daily 3)  Albuterol 90 Mcg/act Aers (Albuterol) .... As needed 4)  Centrum Silver Tabs (Multiple vitamins-minerals) .... Once daily 5)  Aspirin Ec Low Dose 81 Mg Tbec (Aspirin) .Marland Kitchen.. 1 once daily 6)  Caltrate 600+d 600-400 Mg-unit Tabs (Calcium carbonate-vitamin d) .... Two times a day 7)  Omeprazole 40 Mg Cpdr (Omeprazole) .... Take 1 tablet by mouth once a day 8)  Vitamin D 400 Unit Tabs (Cholecalciferol) .... Take 1 tablet by mouth once a day    ]

## 2010-12-21 NOTE — Procedures (Signed)
Summary: COLON   Colonoscopy  Procedure date:  03/30/2004  Findings:      Location:  [facility].    Procedures Next Due Date:    Colonoscopy: 03/2009  Patient Name: Kristine Francis, Kristine Francis MRN:  Procedure Procedures: Colonoscopy CPT: 16109.  Personnel: Endoscopist: Marchelle Rinella L. Juanda Chance, MD.  Exam Location: Exam performed in Outpatient Clinic. Outpatient  Patient Consent: Procedure, Alternatives, Risks and Benefits discussed, consent obtained, from patient. Consent was obtained by the RN.  Indications  Evaluation of: Anemia with low iron saturation.  Increased Risk Screening: For family history of colorectal neoplasia, in  parent  History  Current Medications: Patient is not currently taking Coumadin.  Pre-Exam Physical: Performed Mar 30, 2004. Cardio-pulmonary exam, Rectal exam, HEENT exam , Abdominal exam, Extremity exam, Neurological exam, Mental status exam WNL.  Exam Exam: Extent of exam reached: Cecum, extent intended: Cecum.  The cecum was identified by appendiceal orifice and IC valve. Colon retroflexion performed. Images taken. ASA Classification: I. Tolerance: good.  Monitoring: Pulse and BP monitoring, Oximetry used. Supplemental O2 given.  Colon Prep Used Miralax for colon prep. Prep results: good.  Sedation Meds: Patient assessed and found to be appropriate for moderate (conscious) sedation. Fentanyl 100 mcg. given IV. Versed 10 mg. given IV.  Findings - OTHER FINDING: tortuous redundant colon found in Descending Colon. Comments: difficult exam.   Assessment Normal examination.  Comments: no polyps Events  Unplanned Interventions: No intervention was required.  Unplanned Events: There were no complications. Plans Medication Plan: Iron supplemets: 1 QD, starting Mar 30, 2004   Patient Education: Patient given standard instructions for: Yearly hemoccult testing recommended. Patient instructed to get routine colonoscopy every 5 years.    Disposition: After procedure patient sent to recovery. After recovery patient sent home.    This report was created from the original endoscopy report, which was reviewed and signed by the above listed endoscopist.

## 2010-12-21 NOTE — Progress Notes (Signed)
  Phone Note Outgoing Call   Summary of Call: DEXA scan reveals osteopenia. She may continue to take medication although it is thought reasonable to treat mild osteopenia with calcium and vit D. alone. If she has questions we can discuss at an OV Initial call taken by: Jacques Navy MD,  October 05, 2008 6:00 PM  Follow-up for Phone Call        Pt informed of above, next appointment 12/09 will discuss same then. Follow-up by: Zackery Barefoot CMA,  October 07, 2008 6:47 PM    New/Updated Medications: VITAMIN D 400 UNIT TABS (CHOLECALCIFEROL) Take 1 tablet by mouth once a day

## 2010-12-21 NOTE — Progress Notes (Signed)
Summary: bone density  Phone Note Call from Patient Call back at Home Phone [phone]   Caller: Patient Summary of Call: Patient called [facility] stating that she had a bone density 23yrs ago and need to know if time to schedule. If so she would like orders for this. Please advise Thanks Initial call taken by: Rock Nephew CMA,  September 07, 2010 1:24 PM  Follow-up for Phone Call        if it has been 2 years and one day we can scedule DXA scan 733.0 Follow-up by: Jacques Navy MD,  September 07, 2010 1:25 PM  Additional Follow-up for Phone Call Additional follow up Details #1::        Appt sched for 10/25 for DXA scan. Additional Follow-up by: Verdell Face,  September 08, 2010 1:51 PM

## 2010-12-21 NOTE — Progress Notes (Signed)
Summary: MAIL ORDER MEDS  Phone Note Refill Request   Refills Requested: Medication #1:  FOSAMAX 70 MG  TABS 1 weekly   Supply Requested: 3 months Pt will pick up, call when ready  Initial call taken by: Lamar Sprinkles,  January 15, 2008 1:42 PM  Follow-up for Phone Call        [facility] Follow-up by: Jacques Navy MD,  January 15, 2008 4:01 PM  Additional Follow-up for Phone Call Additional follow up Details #1::        [facility] to pick up Additional Follow-up by: Rock Nephew CMA,  January 16, 2008 11:24 AM      Prescriptions: FOSAMAX 70 MG  TABS (ALENDRONATE SODIUM) 1 weekly  #12 x 3   Entered by:   Lamar Sprinkles   Authorized by:   Jacques Navy MD   Signed by:   Lamar Sprinkles on 01/15/2008   Method used:   Print then Give to Patient   RxID:   [id number]

## 2010-12-21 NOTE — Assessment & Plan Note (Signed)
Summary: ELBOW AND WRIST INFECTION /NWS   Vital Signs:  Patient profile:   70 year old female Height:      60 inches Weight:      91 pounds BMI:     17.84 O2 Sat:      98 % on Room air Temp:     96.8 degrees F oral Pulse rate:   71 / minute BP sitting:   158 / 92  (left arm) Cuff size:   regular  Vitals Entered By: Bill Salinas CMA (August 18, 2009 9:39 AM)  O2 Flow:  Room air CC: Pt here with complaint of spots on wrists' and knees as well as her right elbow/ pt is due for TD Booster, pneumovax, and flu but wants to discuss her current symptoms / ab   Primary Care Provider:  Jacques Navy MD  CC:  Pt here with complaint of spots on wrists' and knees as well as her right elbow/ pt is due for TD Booster, pneumovax, and and flu but wants to discuss her current symptoms / ab.  History of Present Illness: Patient was seen in May for cellulitis of the left elbow treated with TMP/SMX DS. She returned with a small boil on the buttock leading to another round of Bactrim plus rifampin for 10 days with complete resolution of the infection.   She has been released by Dr. Jeral Fruit - she did very well with cerivcal micro-diskectomy with complete resolution of arm pain. she does continue to do neck exercises.   Patient presents today with recurrent pain in the left elbow pain that has subsided. She feels the elbow is slightly swollen. there is good range of motion.   Current Medications (verified): 1)  Alendronate Sodium 70 Mg Tabs (Alendronate Sodium) .Marland Kitchen.. 1 Weekly 2)  Femhrt Low Dose 0.5-2.5 Mg-Mcg  Tabs (Norethindrone-Eth Estradiol) .... Once Daily 3)  Albuterol 90 Mcg/act  Aers (Albuterol) .... As Needed 4)  Centrum Silver  Tabs (Multiple Vitamins-Minerals) .... Once Daily 5)  Aspirin Ec Low Dose 81 Mg  Tbec (Aspirin) .Marland Kitchen.. 1 Once Daily 6)  Caltrate 600+d 600-400 Mg-Unit  Tabs (Calcium Carbonate-Vitamin D) .... Two Times A Day 7)  Omeprazole 40 Mg Cpdr (Omeprazole) .... Take 1 Tablet  By Mouth Once A Day  Allergies (verified): 1)  * Ct Dye PMH-FH-SH reviewed for relevance  Review of Systems  The patient denies anorexia, fever, weight loss, decreased hearing, chest pain, syncope, dyspnea on exertion, abdominal pain, muscle weakness, suspicious skin lesions, depression, and enlarged lymph nodes.    Physical Exam  General:  Well-developed,well-nourished,in no acute distress; alert,appropriate and cooperative throughout examination Msk:  left elbow without erythema, appreciable swelling. there is full ROM. No fluctuance. Skin:  isolated erthematous lesions: right wrist, right distal LE, question of lesion left wrist.   Biopsy site right temple.   Impression & Recommendations:  Problem # 1:  ELBOW PAIN, LEFT (ICD-719.42)  recurrent left elbow pain. Question of arthritic inflammation vs possible gout.   Plan  uric acid level         for pain: NSAID  Orders: TLB-CBC Platelet - w/Differential (85025-CBCD) TLB-Uric Acid, Blood (84550-URIC)  Complete Medication List: 1)  Alendronate Sodium 70 Mg Tabs (Alendronate sodium) .Marland KitchenMarland KitchenMarland Kitchen 1 weekly 2)  Femhrt Low Dose 0.5-2.5 Mg-mcg Tabs (Norethindrone-eth estradiol) .... Once daily 3)  Albuterol 90 Mcg/act Aers (Albuterol) .... As needed 4)  Centrum Silver Tabs (Multiple vitamins-minerals) .... Once daily 5)  Aspirin Ec Low Dose 81 Mg Tbec (  Aspirin) .Marland Kitchen.. 1 once daily 6)  Caltrate 600+d 600-400 Mg-unit Tabs (Calcium carbonate-vitamin d) .... Two times a day 7)  Omeprazole 40 Mg Cpdr (Omeprazole) .... Take 1 tablet by mouth once a day  Other Orders: Pneumococcal Vaccine (84696) Admin 1st Vaccine (29528) Flu Vaccine 61yrs + (41324) Administration Flu vaccine - MCR (M0102)  Patient Instructions: 1)  elbow pain - no evidence of infection. Suspect either an arthritic flare of pain vs mild bursitis (inflammation) vs gout. Plan - check the uric acid level, causative agent for gout. Use non-steroidal anti-inflammaotry drug, i.le.  aspirin or aleve. 2)  Skin lesions - NOT SHINGLES. May be sand flea bite or other insect bite.    Preventive Care Screening  Mammogram:    Date:  11/30/2008    Results:  normal   Bone Density:    Date:  12/22/2006    Results:  normal std dev    Immunizations Administered:  Pneumonia Vaccine:    Vaccine Type: Pneumovax    Site: right deltoid    Mfr: Merck    Dose: 0.5 ml    Route: IM    Given by: Ami Bullins CMA    Exp. Date: 11/10/2010    Lot #: [id number]    VIS given: 06/18/96 version given August 18, 2009.    Flu Vaccine Consent Questions     Do you have a history of severe allergic reactions to this vaccine? no    Any prior history of allergic reactions to egg and/or gelatin? no    Do you have a sensitivity to the preservative Thimersol? no    Do you have a past history of Guillan-Barre Syndrome? no    Do you currently have an acute febrile illness? no    Have you ever had a severe reaction to latex? no    Vaccine information given and explained to patient? yes    Are you currently pregnant? no    Lot Number:[id number]   Exp Date:05/20/2010   Site Given  Left Deltoid IMflu

## 2010-12-21 NOTE — Miscellaneous (Signed)
Summary: RECALL COLON/YF  Clinical Lists Changes  Medications: Added new medication of MIRALAX   POWD (POLYETHYLENE GLYCOL 3350) As per prep  instructions. - Signed Added new medication of DULCOLAX 5 MG  TBEC (BISACODYL) Day before procedure take 2 at 3pm and 2 at 8pm. - Signed Rx of MIRALAX   POWD (POLYETHYLENE GLYCOL 3350) As per prep  instructions.;  #255gm x 0;  Signed;  Entered by: Darlyn Read RN;  Authorized by: Hart Carwin;  Method used: Electronically to [facility] [address] [id number]*, [address], [place], [place]  [address], Ph: [phone], Fax: [phone] Rx of DULCOLAX 5 MG  TBEC (BISACODYL) Day before procedure take 2 at 3pm and 2 at 8pm.;  #4 x 0;  Signed;  Entered by: Darlyn Read RN;  Authorized by: Hart Carwin;  Method used: Electronically to [facility] [id number]*, [address] [place], [place]  [address], Ph: [phone], Fax: [phone]    Prescriptions: DULCOLAX 5 MG  TBEC (BISACODYL) Day before procedure take 2 at 3pm and 2 at 8pm.  #4 x 0   Entered by:   Darlyn Read RN   Authorized by:   Hart Carwin   Signed by:   Darlyn Read RN on 02/25/2009   Method used:   Electronically to        [facility] [address] #[id number]* (retail)       [address] [place], [place]  [address]       Ph: [phone]       Fax: [phone]   RxID:   [id number] MIRALAX   POWD (POLYETHYLENE GLYCOL 3350) As per prep  instructions.  #255gm x 0   Entered by:   Darlyn Read RN   Authorized by:   Hart Carwin   Signed by:   Darlyn Read RN on 02/25/2009   Method used:   Electronically to        [facility] [address] #[id number]* (retail)       [address] [place], [place]  [address]       Ph: [phone]       Fax: [phone]   RxID:   [id number]

## 2010-12-21 NOTE — Assessment & Plan Note (Signed)
Summary: flu shot/men/pn   Nurse Visit    Prior Medications: FOSAMAX 70 MG  TABS (ALENDRONATE SODIUM) 1 weekly FEMHRT LOW DOSE 0.5-2.5 MG-MCG  TABS (NORETHINDRONE-ETH ESTRADIOL) once daily ALBUTEROL 90 MCG/ACT  AERS (ALBUTEROL) as needed CENTRUM SILVER  TABS (MULTIPLE VITAMINS-MINERALS) once daily ASPIRIN EC LOW DOSE 81 MG  TBEC (ASPIRIN) 1 once daily CALTRATE 600+D 600-400 MG-UNIT  TABS (CALCIUM CARBONATE-VITAMIN D) two times a day OMEPRAZOLE 40 MG CPDR (OMEPRAZOLE) two times a day Current Allergies: * CT DYE    Orders Added: 1)  Flu Vaccine 25yrs + [57846] 2)  Administration Flu vaccine [G0008]    ]         Flu Vaccine Consent Questions     Do you have a history of severe allergic reactions to this vaccine? no    Any prior history of allergic reactions to egg and/or gelatin? no    Do you have a sensitivity to the preservative Thimersol? no    Do you have a past history of Guillan-Barre Syndrome? no    Do you currently have an acute febrile illness? no    Have you ever had a severe reaction to latex? no    Vaccine information given and explained to patient? yes    Are you currently pregnant? no    Lot Number:[id number]   Exp Date:05/20/2009   Site Given  Left Deltoid IMu

## 2010-12-21 NOTE — Letter (Signed)
   [facility] [address] [place], [place]  [address] Phone: [phone]      October 07, 2010   Venie Stantz [address] [place], [place] [address]  RE:  LAB RESULTS  Dear  Ms. Hardt,  The following is an interpretation of your most recent lab tests.  Please take note of any instructions provided or changes to medications that have resulted from your lab work.   Bone density study reveals a normal spine; osteopenia at both left and right hips. Overall risk of fracture in the next 10 years is 17.7% (nl 14.2%)); risk of hip fracture over the next 10 years is 4.9% (nl 3.1%)   Recommend - continuing alendronate. Follow-up study in 2 years.    Sincerely Yours,    Jacques Navy MD

## 2010-12-21 NOTE — Miscellaneous (Signed)
Summary: BONE DENSITY  Clinical Lists Changes  Orders: Added new Test order of T-Bone Densitometry (77080) - Signed Added new Test order of T-Lumbar Vertebral Assessment (77082) - Signed 

## 2010-12-21 NOTE — Progress Notes (Signed)
Summary: PAIN MED  Phone Note Call from Patient Call back at Home Phone [phone]   Summary of Call: Pt says that on phone today with Dr Debby Bud she was told that he would call in pain med and prednisone.  Initial call taken by: Lamar Sprinkles,  November 03, 2008 2:01 PM  Follow-up for Phone Call        [facility] 2 meds below per Dr, attempted to contact pt, # busy, will try later Follow-up by: Lamar Sprinkles,  November 03, 2008 2:27 PM  Additional Follow-up for Phone Call Additional follow up Details #1::        Pt informed of rxs, Dr Jeral Fruit said it should be ok to wait until after first of year due to upcoming holidays, depending on how meds for pain work for her.  Additional Follow-up by: Lamar Sprinkles,  November 03, 2008 5:28 PM    Additional Follow-up for Phone Call Additional follow up Details #2::    prednisone 40 x 3 , 30 x 3, 20 x 3, 10 x 6; vicodin 7.5/750 q 6 Follow-up by: Jacques Navy MD,  November 03, 2008 6:08 PM  New/Updated Medications: PREDNISONE 10 MG TABS (PREDNISONE) 4 tabs once daily x 3 days then 3 tabs once daily x 3 days then 2 tabs once daily x 3 days then 1 tab once daily x 6 days VICODIN ES 7.5-750 MG TABS (HYDROCODONE-ACETAMINOPHEN) 1 q 6 hrs as needed pain   Prescriptions: VICODIN ES 7.5-750 MG TABS (HYDROCODONE-ACETAMINOPHEN) 1 q 6 hrs as needed pain  #30 x 1   Entered by:   Lamar Sprinkles   Authorized by:   Jacques Navy MD   Signed by:   Lamar Sprinkles on 11/03/2008   Method used:   Telephoned to ...       [facility] #[id number]* (retail)       [address] [place], [place]  [address]       Ph: [phone]       Fax: [phone]   RxID:   [id number] PREDNISONE 10 MG TABS (PREDNISONE) 4 tabs once daily x 3 days then 3 tabs once daily x 3 days then 2 tabs once daily x 3 days then 1 tab once daily x 6 days  #qs x 0   Entered by:   Lamar Sprinkles   Authorized by:   Jacques Navy MD   Signed by:   Lamar Sprinkles on  11/03/2008   Method used:   Telephoned to ...       [facility] #[id number]* (retail)       [address] [place], [place]  [address]       Ph: [phone]       Fax: [phone]   RxID:   [id number]

## 2010-12-21 NOTE — Progress Notes (Signed)
Summary: FYI surgery  Phone Note Call from Patient   Summary of Call: pt called and wants to let Dr. Debby Bud know that the time for her neck surgery at [facility] has changed............... it will still be friday 11/28/08 but it is now at 9:40 am .....Marland Kitchenshe wanted to let you know because you stated you would be coming to the hospital to check on her Initial call taken by: Windell Norfolk,  November 26, 2008 2:06 PM  Follow-up for Phone Call         noted.  Follow-up by: Jacques Navy MD,  November 26, 2008 9:23 PM

## 2010-12-21 NOTE — Assessment & Plan Note (Signed)
Summary: ANNUAL CPX/AWARE OF FEES/NML   Vital Signs:  Patient Profile:   70 Years Old Female Height:     60 inches Weight:      103 pounds Temp:     97.9 degrees F oral Pulse rate:   73 / minute BP sitting:   132 / 77  (left arm) Cuff size:   regular  Vitals Entered By: Zackery Barefoot CMA (October 23, 2007 1:53 PM)                 PCP:  Chakia Counts  Chief Complaint:  Preventive Care.  History of Present Illness: Patient presents for routine follow-up. Doing well, but still having balance problems. Patient was seen in consultation by Dr. Marylou Flesher May 5th: she had normal MRI brain and C-spine which were normal. Checked B12 and methymalonic which came back normal. No definitive diagnosis was made. Patient does con't to have ataxia.  Current Allergies (reviewed today): * CT DYE  Past Medical History:    Reviewed history from 06/16/2007 and no changes required:       Anemia-iron deficiency       Asthma as a child       GERD       Osteopenia with osteoporosis of the femoral neck       Renal cyst       ataxia-peripherally mediated  Past Surgical History:    Reviewed history from 06/16/2007 and no changes required:       Tonsillectomy       Breast biopsy both benign   Family History:    father-died 30, colon cancer, CAD/MI    mother -died 60, pancreatic cancer,     Paternal aunt, paternal GM with colon cancer    paternal aunt - breast cancer    maternal aunt with ovarian cancer  Social History:    HS[employer]    [employer] until 26th December, 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    married - 1965    3 sons - '66, '69, '70    So in good health   Risk Factors:  Alcohol use:  no Exercise:  yes    Times per week:  3    Type:  works out at [employer] use:  100 %  PAP Smear History:     Date of Last PAP Smear:  01/24/2007    Results:  Normal   Mammogram History:     Date of Last Mammogram:  11/29/2006    Results:  Normal Bilateral    Review of Systems  The patient  denies anorexia, fever, weight loss, vision loss, decreased hearing, hoarseness, chest pain, syncope, dyspnea on exhertion, peripheral edema, prolonged cough, hemoptysis, abdominal pain, melena, hematochezia, severe indigestion/heartburn, hematuria, incontinence, genital sores, muscle weakness, suspicious skin lesions, transient blindness, difficulty walking, depression, unusual weight change, abnormal bleeding, enlarged lymph nodes, angioedema, and breast masses.     Physical Exam  General:     Well-developed,well-nourished,in no acute distress; alert,appropriate and cooperative throughout examination Head:     Normocephalic and atraumatic without obvious abnormalities. No apparent alopecia or balding. Eyes:     No corneal or conjunctival inflammation noted. EOMI. Perrla. Funduscopic exam benign, without hemorrhages, exudates or papilledema. Vision grossly normal. Ears:     External ear exam shows no significant lesions or deformities.  Otoscopic examination reveals clear canals, tympanic membranes are intact bilaterally without bulging, retraction, inflammation or discharge. Hearing is grossly normal bilaterally. Mouth:     Oral mucosa and  oropharynx without lesions or exudates.  Teeth in good repair. Neck:     No deformities, masses, or tenderness noted. Lungs:     Normal respiratory effort, chest expands symmetrically. Lungs are clear to auscultation, no crackles or wheezes. Heart:     Normal rate and regular rhythm. S1 and S2 normal without gallop, murmur, click, rub or other extra sounds. Abdomen:     Bowel sounds positive,abdomen soft and non-tender without masses, organomegaly or hernias noted. Rectal:     deferred Genitalia:     deferred Msk:     No deformity or scoliosis noted of thoracic or lumbar spine.   Pulses:     R and L carotid,radial,femoral,dorsalis pedis and posterior tibial pulses are full and equal bilaterally Extremities:     No clubbing, cyanosis, edema, or  deformity noted with normal full range of motion of all joints.   Neurologic:     No cranial nerve deficits noted. Station and gait are normal. Plantar reflexes are down-going bilaterally. DTRs are symmetrical throughout. Sensory, motor and coordinative functions appear intact. Skin:     Intact without suspicious lesions or rashes Cervical Nodes:     No lymphadenopathy noted Psych:     Cognition and judgment appear intact. Alert and cooperative with normal attention span and concentration. No apparent delusions, illusions, hallucinations    Impression & Recommendations:  Problem # 1:  OSTEOPENIA (ICD-733.90)  The following medications were removed from the medication list:    Calcium 600 + D 600-400 Mg-unit Tabs (Calcium carbonate-vitamin d) .Marland Kitchen... 1 once daily  Her updated medication list for this problem includes:    Fosamax 70 Mg Tabs (Alendronate sodium) .Marland Kitchen... 1 weekly    Caltrate 600+d 600-400 Mg-unit Tabs (Calcium carbonate-vitamin d) .Marland Kitchen..Marland Kitchen Two times a day  stable, continue present medications. Resume Vit D.  Problem # 2:  GERD (ICD-530.81)  Her updated medication list for this problem includes:  Prevacid 30 Mg Cpdr (Lansoprazole) .Marland Kitchen... 1 once dail  Stable  Her updated medication list for this problem includes:    Prevacid 30 Mg Cpdr (Lansoprazole) .Marland Kitchen... 1 once daily   Problem # 3:  ANEMIA-IRON DEFICIENCY (ICD-280.9)  The following medications were removed from the medication list:    Ferrous Sulfate 324 Mg Tbec (Ferrous sulfate) .Marland Kitchen... 2 weekly Hgb: 13.5 (10/16/2007)   Hct: 39.4 (10/16/2007)   RDW: 12.5 (10/16/2007)   MCV: 94.7 (10/16/2007)   MCHC: 34.1 (10/16/2007) TSH: 2.06 (10/16/2007)  Very normal Hgb, may d/c iron.   Problem # 4:  ROUTINE GENERAL MEDICAL EXAM@HEALTH  CARE FACL (ICD-V70.0) up to date with Gyn, mammography, colonoscopy.  Complete Medication List: 1)  Fosamax 70 Mg Tabs (Alendronate sodium) .Marland KitchenMarland KitchenMarland Kitchen 1 weekly 2)  Prevacid 30 Mg Cpdr (Lansoprazole)  .Marland Kitchen.. 1 once daily 3)  Femhrt Low Dose 0.5-2.5 Mg-mcg Tabs (Norethindrone-eth estradiol) .... Once daily 4)  Albuterol 90 Mcg/act Aers (Albuterol) .... As needed 5)  Bl Prenatal Vitamins Tabs (Prenatal vit-fe fumarate-fa) .... Take 1 tablet by mouth once a day 6)  Aspirin Ec Low Dose 81 Mg Tbec (Aspirin) .Marland Kitchen.. 1 once daily 7)  Caltrate 600+d 600-400 Mg-unit Tabs (Calcium carbonate-vitamin d) .... Two times a day     ]

## 2010-12-21 NOTE — Progress Notes (Signed)
  Phone Note Outgoing Call   Reason for Call: Discuss lab or test results Summary of Call: please call patient - uric acid level is normal. CBC is normal-no sign of infection. Thanks Initial call taken by: Jacques Navy MD,  August 19, 2009 7:12 AM  Follow-up for Phone Call        left message on answering machine for pt to call back. Follow-up by: Ami Bullins CMA,  August 19, 2009 9:21 AM  Additional Follow-up for Phone Call Additional follow up Details #1::        Informed pt of lab results Additional Follow-up by: Ami Bullins CMA,  August 20, 2009 11:05 AM

## 2010-12-21 NOTE — Progress Notes (Signed)
Summary: RESULTS/APT??  Phone Note Call from Patient Call back at [facility] Phone [phone]   Summary of Call: Pt is req a call back. Says Dr Debby Bud call pt on saturday. Pt says since Saturday she has been in severe pain in arm. Patient is requesting a return call about results and something to relieve pain.  Initial call taken by: Lamar Sprinkles,  November 03, 2008 9:41 AM  Follow-up for Phone Call        Dr Debby Bud spoke with pt, she has been referred to [facility] for apt today Follow-up by: Lamar Sprinkles,  November 03, 2008 9:56 AM      Appended Document: RESULTS/APT?? Last office visit and mri report faxed to [phone] Dr Jeral Fruit

## 2010-12-21 NOTE — Progress Notes (Signed)
Summary: REFILL  Phone Note From Pharmacy   Summary of Call: [facility] - they did not get rx for gen fosamax. Need to call into [phone] Ref # [id number] Initial call taken by: Lamar Sprinkles, CMA,  September 01, 2010 11:15 AM  Follow-up for Phone Call        Called in  Follow-up by: Lamar Sprinkles, CMA,  September 01, 2010 2:36 PM    Prescriptions: ALENDRONATE SODIUM 70 MG TABS (ALENDRONATE SODIUM) 1 weekly  #12 x 3   Entered by:   Lamar Sprinkles, CMA   Authorized by:   Jacques Navy MD   Signed by:   Lamar Sprinkles, CMA on 09/01/2010   Method used:   Telephoned to ...       [facility]* (mail-order)             , [place]         Ph: [phone]       Fax: [phone]   RxID:   [id number]

## 2010-12-21 NOTE — Therapy (Signed)
Summary: Audiology and letter/Crossley,MD  Audiology and letter/Crossley,MD   Imported By: Lester Lyons Switch 07/17/2008 11:57:28  _____________________________________________________________________  External Attachment:    Type:   Image     Comment:   External Document

## 2010-12-21 NOTE — Assessment & Plan Note (Signed)
Summary: f/u after gastric emptying scan..em    History of Present Illness Visit Type: follow up Primary GI MD: [name] MD Primary Provider: Norins Chief Complaint: follow-up visit/gastric emptying scan History of Present Illness:   70 year old white female with gastro esophageal rteflux. . A gastric emptying scan recently showed 95% retention in the first hour but only 3.2 % retention in 2 hours. Her symptoms are suggestive of intermittent gastroparesis.. She has increased reflux and vomiting on an intermittent basis. We are not sure of the etiology of this problem since she is not a diabetic and she is not on any psychotropic medications which would impair her gastric emptying. After the last visit, the patient did so well that she discontinued her proton pump inhibitor. Several weeks later, she developed regurgitation of food and heartburn. Her last upper endoscopy in March 2009 showed a benign gastric polyp. Her last colonoscopy in May 2005 for iron deficiency was negative.   GI Review of Systems    Reports acid reflux, nausea, and  vomiting.      Denies abdominal pain, belching, bloating, chest pain, dysphagia with liquids, dysphagia with solids, heartburn, loss of appetite, vomiting blood, weight loss, and  weight gain.        Denies anal fissure, black tarry stools, change in bowel habit, constipation, diarrhea, diverticulosis, fecal incontinence, heme positive stool, hemorrhoids, irritable bowel syndrome, jaundice, light color stool, liver problems, rectal bleeding, and  rectal pain.     Prior Medications Reviewed Using: Patient Recall  Updated Prior Medication List: FOSAMAX 70 MG  TABS (ALENDRONATE SODIUM) 1 weekly FEMHRT LOW DOSE 0.5-2.5 MG-MCG  TABS (NORETHINDRONE-ETH ESTRADIOL) once daily ALBUTEROL 90 MCG/ACT  AERS (ALBUTEROL) as needed CENTRUM SILVER  TABS (MULTIPLE VITAMINS-MINERALS) once daily ASPIRIN EC LOW DOSE 81 MG  TBEC (ASPIRIN) 1 once daily CALTRATE 600+D  600-400 MG-UNIT  TABS (CALCIUM CARBONATE-VITAMIN D) two times a day OMEPRAZOLE 40 MG CPDR (OMEPRAZOLE) two times a day  Current Allergies (reviewed today): * CT DYE  Past Medical History:    Reviewed history from 10/23/2007 and no changes required:       Anemia-iron deficiency       Asthma as a child       GERD       Osteopenia with osteoporosis of the femoral neck       Renal cyst       ataxia-peripherally mediated  Past Surgical History:    Reviewed history from 08/14/2008 and no changes required:       Tonsillectomy       Breast biopsies- both benign   Family History:    Reviewed history from 08/14/2008 and no changes required:       father-died 14, colon cancer, CAD/MI       mother -died 68, pancreatic cancer,        Paternal aunt, paternal GM with colon cancer       paternal aunt - breast cancer       maternal aunt with ovarian cancer       Family History of Heart Disease: Father       Family History of Ovarian Cancer: Maternal Aunt  Social History:    Reviewed history from 08/14/2008 and no changes required:       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       married - 1965       3 sons - '66, '69, '70  Son in good health       Patient has never smoked.        Alcohol Use - no       Daily Caffeine Use-2-3 daily       Illicit Drug Use - no    Review of Systems       The patient complains of allergy/sinus, cough, and urine leakage.     Vital Signs:  Patient Profile:   70 Years Old Female Height:     60 inches Weight:      95 pounds BMI:     18.62 Pulse rate:   60 / minute Pulse rhythm:   regular BP sitting:   120 / 80  (right arm)  Vitals Entered By: June McMurray CMA (August 19, 2008 1:58 PM)                  Physical Exam  General:     Well developed, well nourished, no acute distress. Neck:     Supple; no masses or thyromegaly. Lungs:     Clear throughout to auscultation. Heart:     Regular rate  and rhythm; no murmurs, rubs,  or bruits. Abdomen:     soft abdomen, nondistended, mildly tender in epigastrium, normal active bowel sounds, no succussion splash. Extremities:     No clubbing, cyanosis, edema or deformities noted. Neurologic:     Alert and  oriented x4;  grossly normal neurologically.    Impression & Recommendations:  Problem # 1:  Family Hx of COLON CANCER (ICD-153.9) next colonoscopy due in 03/2009  Problem # 2:  GERD (ICD-530.81) Hernias due ro reflux with underlying intermittent gastroparesis of unknown etiology. Patient ought to stay on chronic long-term proton pump inhibitors to prevent significant reflux, she is reluctant to start the Reglan because of possible side effects. She was instructed to stay on small frequent feedings, antireflux measures and not to eat late at night.   Patient Instructions: 1)  Prilosec 40 mg daily 2)  Antireflux measures 3)  Small frequent feedings 4)  Colonoscopy in May 2010    ]

## 2010-12-21 NOTE — Progress Notes (Signed)
Summary: TEST RESULTS-returning Dr.Brodies call   Phone Note Call from Patient Call back at Home Phone [phone]   Call For: DR BRODIE Reason for Call: Lab or Test Results Summary of Call: RETURNNG DR BRODIE'S CALL FROM YESTERDAY. THINKS ITS FOR HER EMPTYING STUDY RESULTS.   Initial call taken by: Leanor Kail [facility],  June 13, 2008 1:50 PM

## 2010-12-21 NOTE — Progress Notes (Signed)
Summary: tussionex refill/ges order   ---- Converted from flag ---- ---- 06/09/2008 5:49 PM, Hart Carwin MD wrote: Rip Harbour ro refill Tussionex 150cc, 1/2 or 1 tsp  by mouth  once daily  as needed -cogh, has to last for 30 days- also please set her up for Gastric emptying scan to r/o gastroparesis. Thanx Dr Juanda Chance- im getting a rx request for refill on Tussionex.  Is it okay to keep refilling this for her chronic cough or did you mean for this to be a one time rx? ------------------------------  Patient scheduled for Gastric emptying scan for 06/12/08 @ 10:00am [facility].  Tussionex rx sent to pharmacy as well.  Hortense Ramal CMA  June 10, 2008 8:45 AM

## 2010-12-21 NOTE — Assessment & Plan Note (Signed)
Summary: yearly fu/$50/pn/ medicare   Vital Signs:  Patient Profile:   70 Years Old Female Height:     60 inches Weight:      95 pounds Temp:     97.6 degrees F oral Pulse rate:   74 / minute BP sitting:   124 / 62  (right arm) Cuff size:   child  Vitals Entered By: Zackery Barefoot CMA (November 17, 2008 10:19 AM)                 PCP:  Norins  Chief Complaint:  Annual visit for disease management.  History of Present Illness: Patient with a cervical radiculopathy and is scheduled for surgery on Jan 8th with Dr. Jeral Fruit. She is taking some limited amounts of pain medication. She still has pain and radicular symptoms in the right hand. She is unable to sleep.  She has a gynecologist who does pelvic/pap and breast exam with last exam in march '09.  Aside from the radiculopathy she is feeling fine.     Prior Medications Reviewed Using: Patient Recall  Updated Prior Medication List: FOSAMAX 70 MG  TABS (ALENDRONATE SODIUM) 1 weekly FEMHRT LOW DOSE 0.5-2.5 MG-MCG  TABS (NORETHINDRONE-ETH ESTRADIOL) once daily ALBUTEROL 90 MCG/ACT  AERS (ALBUTEROL) as needed CENTRUM SILVER  TABS (MULTIPLE VITAMINS-MINERALS) once daily ASPIRIN EC LOW DOSE 81 MG  TBEC (ASPIRIN) 1 once daily CALTRATE 600+D 600-400 MG-UNIT  TABS (CALCIUM CARBONATE-VITAMIN D) two times a day OMEPRAZOLE 40 MG CPDR (OMEPRAZOLE) Take 1 tablet by mouth once a day VICODIN ES 7.5-750 MG TABS (HYDROCODONE-ACETAMINOPHEN) 1 q 6 hrs as needed pain  Current Allergies (reviewed today): * CT DYE  Past Medical History:    Reviewed history from 10/21/2008 and no changes required:       ATAXIA (ICD-781.3)       CERVICAL RADICULOPATHY, RIGHT (ICD-723.4)       EMPHYSEMA (ICD-492.8)       DIVERTICULOSIS OF COLON (ICD-562.10)       RENAL CYST (ICD-593.2)       OSTEOPENIA (ICD-733.90)       GERD (ICD-530.81)       ASTHMA (ICD-493.90)       ANEMIA-IRON DEFICIENCY (ICD-280.9)         Past Surgical History:    Reviewed  history from 08/14/2008 and no changes required:       Tonsillectomy       Breast biopsies- both benign   Family History:    Reviewed history from 08/14/2008 and no changes required:       father-died 39, colon cancer, CAD/MI       mother -died 56, pancreatic cancer,        Paternal aunt, paternal GM with colon cancer       paternal aunt - breast cancer       maternal aunt with ovarian cancer       Family History of Heart Disease: Father       Family History of Ovarian Cancer: Maternal Aunt  Social History:    Reviewed history from 08/14/2008 and no changes required:       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       married - 1965       3 sons - '66, '69, '70       Son in good health       Patient has never smoked.  Alcohol Use - no       Daily Caffeine Use-2-3 daily       Illicit Drug Use - no   Risk Factors: Tobacco use:  never Drug use:  no Alcohol use:  no Exercise:  yes    Times per week:  3    Type:  works out at [employer] use:  100 %  Colonoscopy History:    Date of Last Colonoscopy:  03/30/2004  Mammogram History:    Date of Last Mammogram:  11/29/2006  PAP Smear History:    Date of Last PAP Smear:  01/24/2007   Review of Systems  The patient denies anorexia, fever, weight loss, weight gain, vision loss, decreased hearing, chest pain, syncope, peripheral edema, prolonged cough, hemoptysis, abdominal pain, severe indigestion/heartburn, incontinence, suspicious skin lesions, difficulty walking, depression, angioedema, and breast masses.     Physical Exam  General:     Thin white female in NAD Head:     normocephalic, atraumatic, and no abnormalities observed.   Eyes:     vision grossly intact, pupils equal, pupils round, corneas and lenses clear, no injection, no optic disk abnormalities, and no retinal abnormalitiies.   Ears:     R ear normal, L ear normal, and no external deformities.   Nose:     no external  deformity, no external erythema, and no nasal discharge.   Mouth:     good dentition, no gingival abnormalities, no dental plaque, pharynx pink and moist, no erythema, and no exudates.   Neck:     supple.   Chest Wall:     no deformities and no tenderness.   Breasts:     deferred to gyn Lungs:     normal respiratory effort, no intercostal retractions, no accessory muscle use, no crackles, and no wheezes.   Heart:     normal rate, regular rhythm, no murmur, no JVD, and no HJR.   Abdomen:     soft, non-tender, normal bowel sounds, no guarding, and no rigidity.   Genitalia:     deferred to gyn Msk:     no joint tenderness, no joint swelling, no joint warmth, no redness over joints, and no joint deformities.   Pulses:     2+ radial Extremities:     No clubbing, cyanosis, edema, or deformity noted with normal full range of motion of all joints.   Neurologic:     alert & oriented X3 and cranial nerves II-XII intact.  Further neuro exam deferred to recent exam here and by Dr. Jeral Fruit. Skin:     Intact without suspicious lesions or rashes Cervical Nodes:     No lymphadenopathy noted Psych:     Oriented X3, normally interactive, good eye contact, and not anxious appearing.      Impression & Recommendations:  Problem # 1:  CERVICAL RADICULOPATHY, RIGHT (ICD-723.4) Patient with a C5-6 radiculopathy. she is scheduled for surgery Jan 8th.  She is medically stable and cleared for surgery.  Problem # 2:  ATAXIA (ICD-781.3) chronic persistent problem. she has had thorough evaluation incluidng audiology and ENT evaluation as recently as August '09.  Problem # 3:  EMPHYSEMA (ICD-492.8) By x-ray with no over symptoms, no limitations in activity.  No further eval or treatment needed at this time.   Problem # 4:  OSTEOPENIA (ICD-733.90) Stable on present regimen.  The following medications were removed from the medication list:    Vitamin D 400 Unit Tabs (Cholecalciferol) .Marland Kitchen... Take 1  tablet  by mouth once a day  Her updated medication list for this problem includes:    Fosamax 70 Mg Tabs (Alendronate sodium) .Marland Kitchen... 1 weekly    Caltrate 600+d 600-400 Mg-unit Tabs (Calcium carbonate-vitamin d) .Marland Kitchen..Marland Kitchen Two times a day   Problem # 5:  ANEMIA-IRON DEFICIENCY (ICD-280.9) Hgb: 13.5 (10/16/2007)   Hct: 39.4 (10/16/2007)   RDW: 12.5 (10/16/2007)   MCV: 94.7 (10/16/2007)   MCHC: 34.1 (10/16/2007) TSH: 2.06 (10/16/2007)  this has been a stable situation and does not require any additional testing or treatment at this time.   Problem # 6:  Preventive Health Care (ICD-V70.0) Current with Gyn, mammography. She will be due in 2010 for follow-up colonoscopy. Will add lipid panel to pre-op labs.  In summary: a healthy woman except for the cervical radiculopathy. She will do well with surgery. However, I am not sure she will fully recover the feeling in her right hand or all her strength given the long duration of these symptoms.  She will return as needed.  Complete Medication List: 1)  Fosamax 70 Mg Tabs (Alendronate sodium) .Marland KitchenMarland KitchenMarland Kitchen 1 weekly 2)  Femhrt Low Dose 0.5-2.5 Mg-mcg Tabs (Norethindrone-eth estradiol) .... Once daily 3)  Albuterol 90 Mcg/act Aers (Albuterol) .... As needed 4)  Centrum Silver Tabs (Multiple vitamins-minerals) .... Once daily 5)  Aspirin Ec Low Dose 81 Mg Tbec (Aspirin) .Marland Kitchen.. 1 once daily 6)  Caltrate 600+d 600-400 Mg-unit Tabs (Calcium carbonate-vitamin d) .... Two times a day 7)  Omeprazole 40 Mg Cpdr (Omeprazole) .... Take 1 tablet by mouth once a day 8)  Vicodin Es 7.5-750 Mg Tabs (Hydrocodone-acetaminophen) .Marland Kitchen.. 1 q 6 hrs as needed pain    ]

## 2010-12-21 NOTE — Letter (Signed)
Summary: Recall Colonoscopy Letter  [facility]  [address] [place], [place] [address]   Phone: [phone]  Fax: [phone]      January 07, 2009 MRN: [id number]   Kristine Francis [address] [place], [place]  [address]   Dear [name],   According to your medical record, it is time for you to schedule a Colonoscopy. The American Cancer Society recommends this procedure as a method to detect early colon cancer. Patients with a family history of colon cancer, or a personal history of colon polyps or inflammatory bowel disease are at increased risk.  This letter has beeen generated based on the recommendations made at the time of your procedure. If you feel that in your particular situation this may no longer apply, please contact our office.  Please call our office at [phone] to schedule this appointment or to update your records at your earliest convenience.  Thank you for cooperating with [place] to provide you with the very best care possible.   Sincerely,  Hedwig Morton. Juanda Chance, M.D.  [facility] [phone]

## 2010-12-21 NOTE — Letter (Signed)
Summary: Results Letter  [facility]  [address] [place], [place] [address]   Phone: [phone]  Fax: [phone]        June 26, 2008 MRN: [id number]    Kristine Francis [address] [place], [place]  [address]    Dear [name],  I am sorry I could not reach You. I hope You had nice vacation.  Your gastric emptying scan after 2 hours is normal, but after the firts hour, 95% of the food was still in Your stomach, then , in the second hour, stomach emtied rapidly. I think You may be having digestive problems  after meals due to stomach not emptying right away. We may tray some medication like Reglan.         Sincerely,  Hart Carwin MD  This letter has been electronically signed by your physician.  Appended Document: Results Letter Letter mailed to patient.

## 2010-12-21 NOTE — Assessment & Plan Note (Signed)
Summary: elbow pain/per phone note/men/cd   Vital Signs:  Patient profile:   70 year old female Height:      60 inches Weight:      90.50 pounds BMI:     17.74 O2 Sat:      97 % on Room air Temp:     97.6 degrees F oral Pulse rate:   84 / minute Pulse rhythm:   regular BP sitting:   138 / 82  (left arm) Cuff size:   regular  Vitals Entered By: Rock Nephew CMA (Apr 16, 2009 1:09 PM)  O2 Flow:  Room air Pain Assessment Patient in pain? yes     Location: elbow Intensity: 5   Primary Care Provider:  Norins   History of Present Illness: this is a new patient to me. She is complaining of a one-day history of pain and redness over the tip of the left elbow. She has no history of trauma or injury. She states 15 years ago she had an injury to this elbow until it she was diagnosed and treated as olecranon bursitis by orthopedic surgeon. She ended up  having a severe infection in the elbow and she is concerned that that may be happening again. She has not noticed any blisters or rashes. She has good range of motion in the elbow. She has not taken anything for pain and is not requesting anything for pain.  Current Medications (verified): 1)  Alendronate Sodium 70 Mg Tabs (Alendronate Sodium) .Marland Kitchen.. 1 Weekly 2)  Femhrt Low Dose 0.5-2.5 Mg-Mcg  Tabs (Norethindrone-Eth Estradiol) .... Once Daily 3)  Albuterol 90 Mcg/act  Aers (Albuterol) .... As Needed 4)  Centrum Silver  Tabs (Multiple Vitamins-Minerals) .... Once Daily 5)  Aspirin Ec Low Dose 81 Mg  Tbec (Aspirin) .Marland Kitchen.. 1 Once Daily 6)  Caltrate 600+d 600-400 Mg-Unit  Tabs (Calcium Carbonate-Vitamin D) .... Two Times A Day 7)  Omeprazole 40 Mg Cpdr (Omeprazole) .... Take 1 Tablet By Mouth Once A Day 8)  Vicodin Es 7.5-750 Mg Tabs (Hydrocodone-Acetaminophen) .Marland Kitchen.. 1 Q 6 Hrs As Needed Pain  Allergies (verified): 1)  * Ct Dye  Past History:  Past Medical History: Reviewed history from 11/17/2008 and no changes required. ATAXIA  (ICD-781.3) CERVICAL RADICULOPATHY, RIGHT (ICD-723.4) EMPHYSEMA (ICD-492.8) DIVERTICULOSIS OF COLON (ICD-562.10) RENAL CYST (ICD-593.2) OSTEOPENIA (ICD-733.90) GERD (ICD-530.81) ASTHMA (ICD-493.90) ANEMIA-IRON DEFICIENCY (ICD-280.9)  Past Surgical History: Reviewed history from 08/14/2008 and no changes required. Tonsillectomy Breast biopsies- both benign  Family History: Reviewed history from 08/14/2008 and no changes required. father-died 40, colon cancer, CAD/MI mother -died 25, pancreatic cancer,  Paternal aunt, paternal GM with colon cancer paternal aunt - breast cancer maternal aunt with ovarian cancer Family History of Heart Disease: Father Family History of Ovarian Cancer: Maternal Aunt  Social History: Reviewed history from 08/14/2008 and no changes required. 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 married - 1965 3 sons - '66, '69, '70 Son in good health Patient has never smoked.  Alcohol Use - no Daily Caffeine Use-2-3 daily Illicit Drug Use - no  Review of Systems  The patient denies fever, suspicious skin lesions, and enlarged lymph nodes.    Physical Exam  General:  alert, well-developed, well-nourished, well-hydrated, and underweight appearing.   Mouth:  Oral mucosa and oropharynx without lesions or exudates.  Teeth in good repair. Neck:  supple, full ROM, and no masses.   Lungs:  Normal respiratory effort, chest expands symmetrically. Lungs are clear to auscultation, no  crackles or wheezes. Heart:  Normal rate and regular rhythm. S1 and S2 normal without gallop, murmur, click, rub or other extra sounds. Abdomen:  soft, non-tender, normal bowel sounds, no distention, no hepatomegaly, and no splenomegaly.   Msk:  over the tip of the left elbow, the olecranon bursa, there is a faint area of erythema and scale. There is no evidence of olecranon bursitis. There are no vesicles, pustules, induration or exudate. Pulses:  R and L  carotid,radial,femoral,dorsalis pedis and posterior tibial pulses are full and equal bilaterally Extremities:  No clubbing, cyanosis, edema, or deformity noted with normal full range of motion of all joints.   Neurologic:  No cranial nerve deficits noted. Station and gait are normal. Plantar reflexes are down-going bilaterally. DTRs are symmetrical throughout. Sensory, motor and coordinative functions appear intact. Skin:  turgor normal, color normal, no rashes, and no suspicious lesions.   Cervical Nodes:  No lymphadenopathy noted Psych:  Cognition and judgment appear intact. Alert and cooperative with normal attention span and concentration. No apparent delusions, illusions, hallucinations   Impression & Recommendations:  Problem # 1:  CELLULITIS AND ABSCESS OF OTHER SPECIFIED SITE [phone]) Assessment New  Her updated medication list for this problem includes:    Bactrim Ds 800-160 Mg Tabs (Sulfamethoxazole-trimethoprim) ..... One by mouth two times a day for 14 days  Complete Medication List: 1)  Alendronate Sodium 70 Mg Tabs (Alendronate sodium) .Marland KitchenMarland KitchenMarland Kitchen 1 weekly 2)  Femhrt Low Dose 0.5-2.5 Mg-mcg Tabs (Norethindrone-eth estradiol) .... Once daily 3)  Albuterol 90 Mcg/act Aers (Albuterol) .... As needed 4)  Centrum Silver Tabs (Multiple vitamins-minerals) .... Once daily 5)  Aspirin Ec Low Dose 81 Mg Tbec (Aspirin) .Marland Kitchen.. 1 once daily 6)  Caltrate 600+d 600-400 Mg-unit Tabs (Calcium carbonate-vitamin d) .... Two times a day 7)  Omeprazole 40 Mg Cpdr (Omeprazole) .... Take 1 tablet by mouth once a day 8)  Vicodin Es 7.5-750 Mg Tabs (Hydrocodone-acetaminophen) .Marland Kitchen.. 1 q 6 hrs as needed pain 9)  Bactrim Ds 800-160 Mg Tabs (Sulfamethoxazole-trimethoprim) .... One by mouth two times a day for 14 days  Patient Instructions: 1)  rest, ice, compression  and elevation will help. 2)  Please schedule a follow-up appointment in 2 weeks. 3)  Take 400-600mg  of Ibuprofen (Advil, Motrin) with food  every 4-6 hours as needed for relief of pain or comfort of fever. 4)  Take your antibiotic as prescribed until ALL of it is gone, but stop if you develop a rash or swelling and contact our office as soon as possible. Prescriptions: BACTRIM DS 800-160 MG TABS (SULFAMETHOXAZOLE-TRIMETHOPRIM) One by mouth two times a day for 14 days  #28 x 0   Entered and Authorized by:   Etta Grandchild MD   Signed by:   Etta Grandchild MD on 04/16/2009   Method used:   Electronically to        [facility] [address] [id number]* (retail)       [address] [place], [place]  [address]       Ph: [phone]       Fax: [phone]   RxID:   [id number]

## 2010-12-21 NOTE — Procedures (Signed)
Summary: Colonoscopy   Colonoscopy  Procedure date:  03/12/2009  Findings:      Location:  [facility].    Procedures Next Due Date:    Colonoscopy: 03/2014  COLONOSCOPY PROCEDURE REPORT  PATIENT:  Kristine Francis, Kristine Francis  MR#:  [id number] BIRTHDATE:   1941-02-18, 67 yrs. old   GENDER:   female  ENDOSCOPIST:   Hedwig Morton. Juanda Chance, MD Referred by:   PROCEDURE DATE:  03/12/2009 PROCEDURE:  Surveillance Colonoscopy ASA CLASS:   Class II INDICATIONS: family history of colon cancer in a father,  prior colonoscopies in1991,1993,1997,2000,2005 no polyps  MEDICATIONS:    Versed 8 mg, Fentanyl 75 mcg  DESCRIPTION OF PROCEDURE:   After the risks benefits and alternatives of the procedure were thoroughly explained, informed consent was obtained.  Digital rectal exam was performed and revealed no rectal masses.   The LB [id number]-[id number] endoscope was introduced through the anus and advanced to the cecum, which was identified by both the appendix and ileocecal valve, without limitations.  The quality of the prep was adequate, using MiraLax.  The instrument was then slowly withdrawn as the colon was fully examined. <<PROCEDUREIMAGES>>      <<OLD IMAGES>>  FINDINGS:  A normal appearing cecum, ileocecal valve, and appendiceal orifice were identified. The ascending, transverse, descending, sigmoid colon, and rectum appeared unremarkable (see image1, image2, image3, and image4).   Retroflexed views in the rectum revealed no abnormalities.    The scope was then withdrawn from the patient and the procedure completed.  COMPLICATIONS:   None  ENDOSCOPIC IMPRESSION:  1) Normal colon RECOMMENDATIONS:  1) high fiber diet  REPEAT EXAM:   In 5 year(s) for.   _______________________________ Hedwig Morton. Juanda Chance, MD  CC:

## 2010-12-21 NOTE — Letter (Signed)
   [facility] [address] [place], [place]  [address] Phone: [phone]      November 27, 2008   Maely Carawan [address] [place], [place] [address]  RE:  LAB RESULTS  Dear  Ms. Vanloan,  The following is an interpretation of your most recent lab tests.  Please take note of any instructions provided or changes to medications that have resulted from your lab work.  Health professionals look at cholesterol as more involved than just the total cholesterol. We consider the level of LDL (bad) cholesterol, HDL (good), cholesterol, and Triglycerides (Grease) in the blood.  1. Your LDL should be under 100, and the HDL should be over 45, if you have any vascular disease such as heart attack, angina, stroke, TIA (mini stroke), claudication (pain in the legs when you walk due to poor circulation),  Abdominal Aortic Aneurysm (AAA), diabetes or prediabetes.  2. Your LDL should be under 130 if you have any two of the following:     a. Smoke or chew tobacco,     b. High blood pressure (if you are on medication or over 140/90 without medication),     c. Female gender,    d. HDL below 40,    e. A female relative (father, brother, or son), who have had any vascular event          as described in #1. above under the age of 35, or a female relative (mother,       sister, or daughter) who had an event as described above under age 69. (An HDL over 60 will subtract one risk factor from the total, so if you have two items in # 2 above, but an HDL over 60, you then fall into category # 3 below).  3. Your LDL should be under 160 if you have any one of the above.  Triglycerides should be under 200 with the ideal being under 150.  For diabetes or pre-diabetes, the ideal HgbA1C should be under 6.0%.  If you fall into any of the above categories, you should make a follow up appointment to discuss this with your physician.  LIPID PANEL:  Good - no changes needed Triglyceride: 61    Cholesterol: 205   LDL: 114   HDL: 79   Chol/HDL%:  2.6 Ratio    Lipid panel is fine.   Sincerely Yours,    Jacques Navy MD

## 2010-12-21 NOTE — Assessment & Plan Note (Signed)
Summary: FU -ELBOW STAPH INFECTION-$50-DR MEN NO SLOT--STC   Vital Signs:  Patient profile:   70 year old female Height:      60 inches Weight:      89.50 pounds BMI:     17.54 O2 Sat:      98 % on Room air Temp:     98.4 degrees F oral Pulse rate:   72 / minute BP sitting:   102 / 76  (left arm) Cuff size:   small  Vitals Entered By: Beola Cord, CMA (April 30, 2009 10:03 AM)  O2 Flow:  Room air  Primary Care Provider:  Norins   History of Present Illness: She c/o's a new red. swollen spot over her left buttocks despite finishing the Bactrim yesterday. THe left elbow has healed nicely.  Current Medications (verified): 1)  Alendronate Sodium 70 Mg Tabs (Alendronate Sodium) .Marland Kitchen.. 1 Weekly 2)  Femhrt Low Dose 0.5-2.5 Mg-Mcg  Tabs (Norethindrone-Eth Estradiol) .... Once Daily 3)  Albuterol 90 Mcg/act  Aers (Albuterol) .... As Needed 4)  Centrum Silver  Tabs (Multiple Vitamins-Minerals) .... Once Daily 5)  Aspirin Ec Low Dose 81 Mg  Tbec (Aspirin) .Marland Kitchen.. 1 Once Daily 6)  Caltrate 600+d 600-400 Mg-Unit  Tabs (Calcium Carbonate-Vitamin D) .... Two Times A Day 7)  Omeprazole 40 Mg Cpdr (Omeprazole) .... Take 1 Tablet By Mouth Once A Day  Allergies (verified): 1)  * Ct Dye  Past History:  Past Medical History: Reviewed history from 11/17/2008 and no changes required. ATAXIA (ICD-781.3) CERVICAL RADICULOPATHY, RIGHT (ICD-723.4) EMPHYSEMA (ICD-492.8) DIVERTICULOSIS OF COLON (ICD-562.10) RENAL CYST (ICD-593.2) OSTEOPENIA (ICD-733.90) GERD (ICD-530.81) ASTHMA (ICD-493.90) ANEMIA-IRON DEFICIENCY (ICD-280.9)  Past Surgical History: Reviewed history from 08/14/2008 and no changes required. Tonsillectomy Breast biopsies- both benign  Family History: Reviewed history from 08/14/2008 and no changes required. father-died 17, colon cancer, CAD/MI mother -died 33, pancreatic cancer,  Paternal aunt, paternal GM with colon cancer paternal aunt - breast cancer maternal aunt  with ovarian cancer Family History of Heart Disease: Father Family History of Ovarian Cancer: Maternal Aunt  Social History: Reviewed history from 08/14/2008 and no changes required. 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 married - 1965 3 sons - '66, '69, '70 Son in good health Patient has never smoked.  Alcohol Use - no Daily Caffeine Use-2-3 daily Illicit Drug Use - no  Review of Systems  The patient denies anorexia, fever, weight loss, peripheral edema, prolonged cough, abdominal pain, hematuria, and enlarged lymph nodes.    Physical Exam  General:  alert, well-developed, well-nourished, well-hydrated, and underweight appearing.   Neck:  supple, full ROM, and no masses.   Lungs:  Normal respiratory effort, chest expands symmetrically. Lungs are clear to auscultation, no crackles or wheezes. Heart:  Normal rate and regular rhythm. S1 and S2 normal without gallop, murmur, click, rub or other extra sounds. Abdomen:  soft, non-tender, normal bowel sounds, no distention, no hepatomegaly, and no splenomegaly.   Skin:  there is a 1 cm red, indurated nodule over the left buttock with no  vesicles, fluctuance, exudate, pustules, or streaking. Psych:  Cognition and judgment appear intact. Alert and cooperative with normal attention span and concentration. No apparent delusions, illusions, hallucinations   Impression & Recommendations:  Problem # 1:  CELLULITIS AND ABSCESS OF OTHER SPECIFIED SITE [phone]) Assessment Deteriorated she needs rifampin as this is a recurrent problem and all reservoirs of MRSA need to be eradicated. The following medications were removed from the medication  list:    Bactrim Ds 800-160 Mg Tabs (Sulfamethoxazole-trimethoprim) ..... One by mouth two times a day for 14 days Her updated medication list for this problem includes:    Rifampin 150 Mg Caps (Rifampin) ..... One by mouth two times a day for 10 days    Bactrim Ds 800-160 Mg  Tabs (Sulfamethoxazole-trimethoprim) ..... One by mouth two times a day for 10 days  Complete Medication List: 1)  Alendronate Sodium 70 Mg Tabs (Alendronate sodium) .Marland KitchenMarland KitchenMarland Kitchen 1 weekly 2)  Femhrt Low Dose 0.5-2.5 Mg-mcg Tabs (Norethindrone-eth estradiol) .... Once daily 3)  Albuterol 90 Mcg/act Aers (Albuterol) .... As needed 4)  Centrum Silver Tabs (Multiple vitamins-minerals) .... Once daily 5)  Aspirin Ec Low Dose 81 Mg Tbec (Aspirin) .Marland Kitchen.. 1 once daily 6)  Caltrate 600+d 600-400 Mg-unit Tabs (Calcium carbonate-vitamin d) .... Two times a day 7)  Omeprazole 40 Mg Cpdr (Omeprazole) .... Take 1 tablet by mouth once a day 8)  Rifampin 150 Mg Caps (Rifampin) .... One by mouth two times a day for 10 days 9)  Bactrim Ds 800-160 Mg Tabs (Sulfamethoxazole-trimethoprim) .... One by mouth two times a day for 10 days  Patient Instructions: 1)  Please schedule a follow-up appointment in 1 month. 2)  Take your antibiotic as prescribed until ALL of it is gone, but stop if you develop a rash or swelling and contact our office as soon as possible. Prescriptions: BACTRIM DS 800-160 MG TABS (SULFAMETHOXAZOLE-TRIMETHOPRIM) One by mouth two times a day for 10 days  #20 x 1   Entered and Authorized by:   Etta Grandchild MD   Signed by:   Etta Grandchild MD on 04/30/2009   Method used:   Electronically to        [id number]* (retail)       [address] [place], [place]  [address]       Ph: [phone]       Fax: [phone]   RxID:   [id number] RIFAMPIN 150 MG CAPS (RIFAMPIN) One by mouth two times a day for 10 days  #20 x 1   Entered and Authorized by:   Etta Grandchild MD   Signed by:   Etta Grandchild MD on 04/30/2009   Method used:   Electronically to        [facility] #[id number]* (retail)       [address] [place], [place]  [address]       Ph: [phone]       Fax: [phone]   RxID:   [id number]

## 2010-12-21 NOTE — Progress Notes (Signed)
Summary: alendronate 70mg   Phone Note Refill Request Message from:  Fax from Pharmacy on March 11, 2010 1:46 PM  Refills Requested: Medication #1:  ALENDRONATE SODIUM 70 MG TABS 1 weekly  Method Requested: Electronic Initial call taken by: Orlan Leavens,  March 11, 2010 1:47 PM    Prescriptions: ALENDRONATE SODIUM 70 MG TABS (ALENDRONATE SODIUM) 1 weekly  #12 x 3   Entered by:   Orlan Leavens   Authorized by:   Jacques Navy MD   Signed by:   Orlan Leavens on 03/11/2010   Method used:   Electronically to        [id number]* (retail)       [address] [place], [place]  [address]       Ph: [phone]       Fax: [phone]   RxID:   [id number]

## 2010-12-21 NOTE — Progress Notes (Signed)
  Phone Note Refill Request Message from:  Fax from Pharmacy on August 26, 2010 8:47 AM  Refills Requested: Medication #1:  ALENDRONATE SODIUM 70 MG TABS 1 weekly Initial call taken by: Ami Bullins CMA,  August 26, 2010 8:47 AM    Prescriptions: ALENDRONATE SODIUM 70 MG TABS (ALENDRONATE SODIUM) 1 weekly  #12 x 3   Entered by:   Ami Bullins CMA   Authorized by:   Jacques Navy MD   Signed by:   Bill Salinas CMA on 08/26/2010   Method used:   Faxed to ...       [facility]* (mail-order)             , [place]         Ph: [phone]       Fax: [phone]   RxID:   [id number]

## 2010-12-21 NOTE — Consult Note (Signed)
Summary: [facility]  [facility]   Imported By: Lanelle Bal 11/26/2008 15:57:07  _____________________________________________________________________  External Attachment:    Type:   Image     Comment:   External Document

## 2010-12-21 NOTE — Assessment & Plan Note (Signed)
Summary: FLU VAC  MEN  STC   Nurse Visit   Allergies: 1)  * Ct Dye  Orders Added: 1)  Flu Vaccine 36yrs + MEDICARE PATIENTS [Q2039] 2)  Administration Flu vaccine - MCR [G0008] .[id number]   Flu Vaccine Consent Questions     Do you have a history of severe allergic reactions to this vaccine? no    Any prior history of allergic reactions to egg and/or gelatin? no    Do you have a sensitivity to the preservative Thimersol? no    Do you have a past history of Guillan-Barre Syndrome? no    Do you currently have an acute febrile illness? no    Have you ever had a severe reaction to latex? no    Vaccine information given and explained to patient? yes    Are you currently pregnant? no    Lot Number:[id number]   Exp Date:05/21/2011   Site Given  Left Deltoid IM Lanier Prude, [facility])  September 09, 2010 8:41 AM

## 2010-12-21 NOTE — Procedures (Signed)
Summary: EGD   EGD  Procedure date:  01/22/2008  Findings:      Location: [facility]   Patient Name: Kristine Francis, Kristine Francis MRN:  Procedure Procedures: Panendoscopy (EGD) CPT: 43235.    with biopsy(s)/brushing(s). CPT: D1846139.  Personnel: Endoscopist: Edan Serratore L. Juanda Chance, MD.  Exam Location: Exam performed in Outpatient Clinic. Outpatient  Patient Consent: Procedure, Alternatives, Risks and Benefits discussed, consent obtained, from patient. Consent was obtained by the RN.  Indications Symptoms: Pulmonary symptoms, including:  Cough. Asthma.  History  Current Medications: Patient is not currently taking Coumadin.  Pre-Exam Physical: Performed Jan 22, 2008  Cardio-pulmonary exam, HEENT exam, Abdominal exam, Extremity exam, Neurological exam, Mental status exam WNL.  Comments: Pt. history reviewed/updated, physical exam performed prior to initiation of sedation?yes Exam Exam Info: Maximum depth of insertion Duodenum, intended Duodenum. Vocal cords visualized. Gastric retroflexion performed. Images taken. ASA Classification: II. Tolerance: good.  Sedation Meds: Patient assessed and found to be appropriate for moderate (conscious) sedation. Fentanyl 50 mcg. given IV. Versed 5 mg. given IV. Cetacaine Spray 2 sprays given aerosolized.  Monitoring: BP and pulse monitoring done. Oximetry used. Supplemental O2 given  Findings - Normal: Distal Esophagus.  - POLYP: in Fundus. Maximum size: 2 mm. sessile polyp. Procedure: biopsy without cautery, removed, Polyp retrieved, sent to pathology. ICD9: Neoplasia, Malignant, Stomach: 151.9. Comments: multiple small fundic gland polyps removed.  - Normal: Duodenal Apex to Duodenal 2nd Portion.   Assessment Abnormal examination, see findings above.  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  Unplanned Events: There were no complications. Plans Medication(s): Await pathology. PPI: Lansoprazole/Prevacid 30 mg QD, starting Jan 22, 2008  Tussinex: 1 tsp prn, starting Jan 22, 2008   Comments: CXR last week showed stable emphysema Disposition: After procedure patient sent to recovery. After recovery patient sent home.   Patient Name: Kristine Francis, Kristine Francis MRN:  Amendment 1 - Aug 18, 2008 0 Assessment DELETED<<>>DELETED Diagnoses: DELETED<< 151.9: Neoplasia, Malignant, Stomach.  >>DELETED Diagnoses: 211.1: Neoplasia, Benign, Stomach.    This report was created from the original endoscopy report, which was reviewed and signed by the above listed endoscopist.

## 2010-12-21 NOTE — Progress Notes (Signed)
Summary: RESULTS OF UTI????  Phone Note Call from Patient Call back at Home Phone [phone]   Caller: Patient Call For: Genevra Orne Summary of Call: Patient called s/o UTI stating 12/25/07 6pm.  Patinet was told that Dr Debby Bud had 1 opening at 1:15 but she has another appt at that time. Patient stated that she is willing to see any MD. Please advise Initial call taken by: Rock Nephew CMA,  December 26, 2007 8:40 AM  Follow-up for Phone Call        may come to lab for U/A 595.0 and will treat if positive. Follow-up by: Jacques Navy MD,  December 26, 2007 1:23 PM  Additional Follow-up for Phone Call Additional follow up Details #1::        Returned call to patient to advise per MD. Told patient not home.  Patient arrive here at office and advised per MD// order placed in [facility] by schedulers Additional Follow-up by: Rock Nephew CMA,  December 26, 2007 2:03 PM    Additional Follow-up for Phone Call Additional follow up Details #2::    U/A positive. Plan: cipro 250 mg two times a day x 5, #10, no refills. Will need OV if symptoms don't clear. Follow-up by: Jacques Navy MD,  December 27, 2007 7:57 AM  Additional Follow-up for Phone Call Additional follow up Details #3:: Details for Additional Follow-up Action Taken: Lf mess for check with pharm Additional Follow-up by: Lamar Sprinkles,  December 27, 2007 8:02 AM  New/Updated Medications: CIPRO 250 MG TABS (CIPROFLOXACIN HCL) 1 two times a day 5d, office visit if symptoms do not clear   Prescriptions: CIPRO 250 MG TABS (CIPROFLOXACIN HCL) 1 two times a day 5d, office visit if symptoms do not clear  #10 x 0   Entered by:   Lamar Sprinkles   Authorized by:   Jacques Navy MD   Signed by:   Lamar Sprinkles on 12/27/2007   Method used:   Electronically sent to ...       [id number]*       [address]       [place], [place]  [address]       Ph: [phone]       Fax: [phone]   RxID:    [id number]

## 2010-12-21 NOTE — Letter (Signed)
Summary: [facility]  [facility]   Imported By: Esmeralda Links D'jimraou 01/08/2009 10:06:02  _____________________________________________________________________  External Attachment:    Type:   Image     Comment:   External Document

## 2010-12-21 NOTE — Letter (Signed)
   [facility] [address] [place], [place]  [address] Phone: [phone]      October 17, 2007   Annise Leavitt [address] [place], [place] [address]  RE:  LAB RESULTS  Dear  Ms. Romanoff,  The following is an interpretation of your most recent lab tests.  Please take note of any instructions provided or changes to medications that have resulted from your lab work.  ELECTROLYTES:  Good - no changes needed  KIDNEY FUNCTION TESTS:  Good - no changes needed  LIVER FUNCTION TESTS:  Good - no changes needed  LIPID PANEL:  Good - no changes needed Triglyceride: 53   Cholesterol: 192   LDL: 115   HDL: 66.8   Chol/HDL%:  2.9 CALC  THYROID STUDIES:  Thyroid studies normal TSH: 2.06     DIABETIC STUDIES:  Excellent - no changes needed Blood Glucose: 91    CBC:  Good - no changes needed  please come see me if you have any questions about these lab results    Sincerely Yours,    Jacques Navy MD

## 2010-12-21 NOTE — Assessment & Plan Note (Signed)
Summary: TOE PAIN/ NWS   Vital Signs:  Patient profile:   70 year old female Height:      60 inches Weight:      92 pounds BMI:     18.03 O2 Sat:      97 % on Room air Temp:     98.1 degrees F oral Pulse rate:   79 / minute BP sitting:   128 / 80  (left arm) Cuff size:   regular  Vitals Entered By: Bill Salinas CMA (March 19, 2010 4:05 PM)  O2 Flow:  Room air CC: pt here with complaint of painful place  between her toes on left foot/ab   Primary Care Provider:  Jacques Navy MD  CC:  pt here with complaint of painful place  between her toes on left foot/ab.  History of Present Illness: Patient presents for evaluation of a sore place between the 2nd and 3rd toes left foot. she reports it felt like a rock between her toes. She denies any trauma. She has not had any fever, leg pain, gait abnormality  Current Medications (verified): 1)  Alendronate Sodium 70 Mg Tabs (Alendronate Sodium) .Marland Kitchen.. 1 Weekly 2)  Femhrt Low Dose 0.5-2.5 Mg-Mcg  Tabs (Norethindrone-Eth Estradiol) .... Once Daily 3)  Albuterol 90 Mcg/act  Aers (Albuterol) .... As Needed 4)  Centrum Silver  Tabs (Multiple Vitamins-Minerals) .... Once Daily 5)  Aspirin Ec Low Dose 81 Mg  Tbec (Aspirin) .Marland Kitchen.. 1 Once Daily 6)  Caltrate 600+d 600-400 Mg-Unit  Tabs (Calcium Carbonate-Vitamin D) .... Two Times A Day 7)  Omeprazole 40 Mg Cpdr (Omeprazole) .... Take 1 Tablet By Mouth Once A Day  Allergies (verified): 1)  * Ct Dye PMH-FH-SH reviewed-no changes except otherwise noted  Review of Systems  The patient denies anorexia, weight loss, weight gain, chest pain, dyspnea on exertion, prolonged cough, hemoptysis, hematochezia, muscle weakness, difficulty walking, unusual weight change, and enlarged lymph nodes.    Physical Exam  General:  Very slender white female in no distress Lungs:  normal respiratory effort.   Heart:  normal rate and regular rhythm.   Msk:  mild deformity at the MTP joints left foot. Overlap of 3rd  on 2nd toe left foot.  Skin:  callus on the lateral aspect of the distal 2nd phalanx left foot. No inflammation or drainage noted.    Impression & Recommendations:  Problem # 1:  CALLUS, TOE (ICD-700) callus lateral aspect 2nd toe left foot from pressure of overlaying toes.  Plan - pumice stone to keep callus flat           toe spacer            may come to podiatry consult.   Complete Medication List: 1)  Alendronate Sodium 70 Mg Tabs (Alendronate sodium) .Marland KitchenMarland KitchenMarland Kitchen 1 weekly 2)  Femhrt Low Dose 0.5-2.5 Mg-mcg Tabs (Norethindrone-eth estradiol) .... Once daily 3)  Albuterol 90 Mcg/act Aers (Albuterol) .... As needed 4)  Centrum Silver Tabs (Multiple vitamins-minerals) .... Once daily 5)  Aspirin Ec Low Dose 81 Mg Tbec (Aspirin) .Marland Kitchen.. 1 once daily 6)  Caltrate 600+d 600-400 Mg-unit Tabs (Calcium carbonate-vitamin d) .... Two times a day 7)  Omeprazole 40 Mg Cpdr (Omeprazole) .... Take 1 tablet by mouth once a day

## 2010-12-21 NOTE — Progress Notes (Signed)
Summary: APT TODAY  Phone Note Call from Patient Call back at Home Phone [phone]   Summary of Call: Patient is requesting an apt w/Dr Norins today. C/o elbow burning. In past (10 to 15 yrs ago) area had to be lacerated and became infected. Please offer apt with someone else if willing, THANKS Initial call taken by: Lamar Sprinkles,  Apr 16, 2009 10:15 AM  Follow-up for Phone Call        appt today at !pm w/Dr Yetta Barre Follow-up by: Verdell Face,  Apr 16, 2009 10:25 AM

## 2010-12-23 NOTE — Assessment & Plan Note (Signed)
Summary: YEARLY---STC   Vital Signs:  Patient profile:   70 year old female Height:      60 inches Weight:      92 pounds BMI:     18.03 O2 Sat:      99 % on Room air Temp:     98.1 degrees F oral Pulse rate:   69 / minute BP sitting:   138 / 68  (left arm) Cuff size:   regular  Vitals Entered By: Bill Salinas CMA (December 07, 2010 10:25 AM)  O2 Flow:  Room air  Primary Care Provider:  Jacques Navy MD   History of Present Illness: Kristine Francis presents for an annual wellness exam. She has been doing well.   She does have a problem with increased leg cramps, mostly nocturnal and predominantly in the left leg. She repots the leg will feel heavy. She has been drinking tonic water regularly which isn't helping. She has changed HRT products from Femhrt to Activella - same drugs. She is now weaning off HRT completely.  She is curren with her gynecologist, Dr. Greta Doom and is current with mammography.  She remains 100% independent in all ADLs. She has had no falls and has no fall risk. She is cognitively intact participating in community activities, managing her household and finances. She has no signs or symptoms of depression.   Preventive Screening-Counseling & Management  Alcohol-Tobacco     Alcohol drinks/day: 0     Smoking Status: never  Caffeine-Diet-Exercise     Caffeine use/day: 2 cups per day     Diet Comments: healthy diet     Diet Counseling: not indicated; diet is assessed to be healthy     Does Patient Exercise: yes     Type of exercise: walking , treadmill, light weight lifting     Exercise (avg: min/session): 30-60     Times/week: 3  Hep-HIV-STD-Contraception     Hepatitis Risk: no risk noted     HIV Risk: no risk noted     STD Risk: no risk noted     Dental Visit-last 6 months yes     Sun Exposure-Excessive: no  Safety-Violence-Falls     Seat Belt Use: yes     Helmet Use: n/a     Firearms in the Home: firearms in the home     Smoke Detectors: no  Violence in the Home: no risk noted     Sexual Abuse: no     Fall Risk: moderate fall risk      Sexual History:  currently monogamous.        Drug Use:  never.        Blood Transfusions:  no.    Current Medications (verified): 1)  Alendronate Sodium 70 Mg Tabs (Alendronate Sodium) .Marland Kitchen.. 1 Weekly 2)  Albuterol 90 Mcg/act  Aers (Albuterol) .... As Needed 3)  Centrum Silver  Tabs (Multiple Vitamins-Minerals) .... Once Daily 4)  Aspirin Ec Low Dose 81 Mg  Tbec (Aspirin) .Marland Kitchen.. 1 Once Daily 5)  Caltrate 600+d 600-400 Mg-Unit  Tabs (Calcium Carbonate-Vitamin D) .... Two Times A Day 6)  Omeprazole 40 Mg Cpdr (Omeprazole) .... Take 1 Tablet By Mouth Two Times A Day 7)  Vitamin E 600 Unit  Caps (Vitamin E) .... One Tablet By Mouth Once Daily  Allergies (verified): 1)  * Ct Dye  Past History:  Past Medical History: Last updated: 11/23/2010 CALLUS, TOE (ICD-700) HIP PAIN, RIGHT (ICD-719.45) ELBOW PAIN, LEFT (ICD-719.42) CELLULITIS AND ABSCESS OF  OTHER SPECIFIED SITE [phone]) ATAXIA (ICD-781.3) CERVICAL RADICULOPATHY, RIGHT (ICD-723.4) EMPHYSEMA (ICD-492.8) DIVERTICULOSIS OF COLON (ICD-562.10) RENAL CYST (ICD-593.2) OSTEOPENIA (ICD-733.90) GERD (ICD-530.81) ASTHMA (ICD-493.90) ANEMIA-IRON DEFICIENCY (ICD-280.9)  Past Surgical History: Last updated: 12/03/2009 Tonsillectomy Breast biopsies- both benign C-spine surgery '10 ([place])  Family History: Last updated: 2008-08-29 father-died 43, colon cancer, CAD/MI mother -died 58, pancreatic cancer,  Paternal aunt, paternal GM with colon cancer paternal aunt - breast cancer maternal aunt with ovarian cancer Family History of Heart Disease: Father Family History of Ovarian Cancer: Maternal Aunt  Social History: Last updated: 12/03/2009 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 married - 1965 3 sons - '66, '69, '70 Son in good health Patient has never smoked.  Alcohol Use - no Daily Caffeine Use-2-3  daily Illicit Drug Use - no  Social History: Caffeine use/day:  2 cups per day Dental Care w/in 6 mos.:  yes Sun Exposure-Excessive:  no Seat Belt Use:  yes Fall Risk:  moderate fall risk Blood Transfusions:  no Hepatitis Risk:  no risk noted HIV Risk:  no risk noted STD Risk:  no risk noted Sexual History:  currently monogamous Drug Use:  never  Review of Systems  The patient denies anorexia, fever, weight loss, weight gain, decreased hearing, chest pain, dyspnea on exertion, prolonged cough, abdominal pain, hematochezia, incontinence, muscle weakness, difficulty walking, unusual weight change, angioedema, and breast masses.    Physical Exam  General:  Very slender white woman in no distress Head:  Normocephalic and atraumatic without obvious abnormalities. No apparent alopecia or balding. Eyes:  No corneal or conjunctival inflammation noted. EOMI. Perrla. Funduscopic exam benign, without hemorrhages, exudates or papilledema. Vision grossly normal. Ears:  External ear exam shows no significant lesions or deformities.  Otoscopic examination reveals clear canals, tympanic membranes are intact bilaterally without bulging, retraction, inflammation or discharge. Hearing is grossly normal bilaterally. Nose:  no external deformity, no external erythema, and no nasal discharge.   Mouth:  Oral mucosa and oropharynx without lesions or exudates.  Teeth in good repair. Neck:  supple, full ROM, no thyromegaly, and no carotid bruits.   Chest Wall:  Thin but no deformities Breasts:  deferred to gyn Lungs:  Normal respiratory effort, chest expands symmetrically. Lungs are clear to auscultation, no crackles or wheezes. Heart:  Normal rate and regular rhythm. S1 and S2 normal without gallop, murmur, click, rub or other extra sounds. Abdomen:  soft, non-tender, normal bowel sounds, no guarding, and no hepatomegaly.   Genitalia:  deferred to gyn Msk:  normal ROM, no joint tenderness, no joint  swelling, no redness over joints, and no joint deformities.   Pulses:  2+ radial and DP pulses. Good capillary refill Extremities:  No clubbing, cyanosis, edema, or deformity noted with normal full range of motion of all joints.   Neurologic:  alert & oriented X3, cranial nerves II-XII intact, strength normal in all extremities, gait normal, and DTRs symmetrical and normal.   Skin:  turgor normal, color normal, no rashes, no suspicious lesions, no ecchymoses, and no ulcerations.  Area above right ear is normal without lesions Cervical Nodes:  no anterior cervical adenopathy and no posterior cervical adenopathy.   Psych:  Oriented X3, memory intact for recent and remote, normally interactive, and good eye contact.     Impression & Recommendations:  Problem # 1:  LEG CRAMPS, NOCTURNAL (ICD-729.82) Persistent and progressive problem. Will check Potassium and magnesium. She brings to my attention the use of Ginko [place] for cramps.  Referenced this to wikipedia - theer is a literature that supports the use of this herb  Plan - trial of Ginko           labs.  Addendum - potassium and magnesium - normal  Problem # 2:  EMPHYSEMA (ICD-492.8) She is doing very well. She is followed by pulmonary on a routine basis. She has no limitations in activities.  Problem # 3:  Preventive Health Care (ICD-V70.0) Interval history is unremarkable except for leg cramps. Limited physical exam is normal and she reports her Gyn exam was normal. She is current with colorectal cancer screening with last study April '10. She reports she had a mammogram in '11. Lab results are good: total cholesterol is elevated but HDL is 90+ -very robust and LDL is just below goal of 811 or less. Immunizations: Pneumonia vaccine Sept '10; Flue October '11. She is provided a prescription for Zostavax. 12 Lead EKG machine read as possible old inferior infarct on my review it is a normal EKG.   In summary - a very nice woman who is  medically stable. She will try Ginko for leg cramps and let me know how that works. She will return as needed on in 1 year.  Orders: TLB-Lipid Panel (80061-LIPID) TLB-Magnesium (Mg) (83735-MG) Medicare -1st Annual Wellness Visit [phone])  Complete Medication List: 1)  Alendronate Sodium 70 Mg Tabs (Alendronate sodium) .Marland KitchenMarland KitchenMarland Kitchen 1 weekly 2)  Albuterol 90 Mcg/act Aers (Albuterol) .... As needed 3)  Centrum Silver Tabs (Multiple vitamins-minerals) .... Once daily 4)  Aspirin Ec Low Dose 81 Mg Tbec (Aspirin) .Marland Kitchen.. 1 once daily 5)  Caltrate 600+d 600-400 Mg-unit Tabs (Calcium carbonate-vitamin d) .... Two times a day 6)  Omeprazole 40 Mg Cpdr (Omeprazole) .... Take 1 tablet by mouth two times a day 7)  Vitamin E 600 Unit Caps (Vitamin e) .... One tablet by mouth once daily  Other Orders: TLB-CBC Platelet - w/Differential (85025-CBCD) TLB-BMP (Basic Metabolic Panel-BMET) (80048-METABOL)  Patient: Kristine Francis Note: All result statuses are Final unless otherwise noted.  Tests: (1) CBC Platelet w/Diff (CBCD)   White Cell Count          6.3 K/uL                    4.5-10.5   Red Cell Count            4.02 Mil/uL                 3.87-5.11   Hemoglobin                13.3 g/dL                   29.5-62.1   Hematocrit                38.0 %                      36.0-46.0   MCV                       94.6 fl                     78.0-100.0   MCHC                      34.9 g/dL  30.0-36.0   RDW                       13.9 %                      11.5-14.6   Platelet Count            261.0 K/uL                  150.0-400.0   Neutrophil %              68.6 %                      43.0-77.0   Lymphocyte %              20.9 %                      12.0-46.0   Monocyte %                9.1 %                       3.0-12.0   Eosinophils%              1.0 %                       0.0-5.0   Basophils %               0.4 %                       0.0-3.0   Neutrophill Absolute      4.3 K/uL                     1.4-7.7   Lymphocyte Absolute       1.3 K/uL                    0.7-4.0   Monocyte Absolute         0.6 K/uL                    0.1-1.0  Eosinophils, Absolute                             0.1 K/uL                    0.0-0.7   Basophils Absolute        0.0 K/uL                    0.0-0.1  Tests: (2) BMP (METABOL)   Sodium                    141 mEq/L                   135-145   Potassium                 3.8 mEq/L                   3.5-5.1   Chloride                  103 mEq/L  96-112   Carbon Dioxide            30 mEq/L                    19-32   Glucose              [H]  107 mg/dL                   91-47   BUN                       14 mg/dL                    8-29   Creatinine                0.7 mg/dL                   5.6-2.1   Calcium                   9.5 mg/dL                   3.0-86.5   GFR                       94.49 mL/min                >60.00  Tests: (3) Lipid Panel (LIPID)   Cholesterol          [H]  244 mg/dL                   7-846     ATP III Classification            Desirable:  < 200 mg/dL                    Borderline High:  200 - 239 mg/dL               High:  > = 240 mg/dL   Triglycerides             142.0 mg/dL                 9.6-295.2     Normal:  <150 mg/dL     Borderline High:  841 - 199 mg/dL   HDL                       32.44 mg/dL                 >01.02   VLDL Cholesterol          28.4 mg/dL                  7.2-53.6  CHO/HDL Ratio:  CHD Risk                             3                    Men          Women     1/2 Average Risk     3.4          3.3     Average Risk          5.0  4.4     2X Average Risk          9.6          7.1     3X Average Risk          15.0          11.0                           Tests: (4) Magnesium (MG)   Magnesium                 2.1 mg/dL                   4.4-0.1  Tests: (5) Cholesterol LDL - Direct (DIRLDL)  Cholesterol LDL - Direct                             129.3 mg/dL     Optimal:   <027 mg/dL     Near or Above Optimal:  100-129 mg/dL     Borderline High:  253-664 mg/dL     High:  403-474 mg/dL     Very High:  >259 mg/dL  Orders Added: 1)  TLB-CBC Platelet - w/Differential [85025-CBCD] 2)  TLB-BMP (Basic Metabolic Panel-BMET) [80048-METABOL] 3)  TLB-Lipid Panel [80061-LIPID] 4)  TLB-Magnesium (Mg) [83735-MG] 5)  Medicare -1st Annual Wellness Visit [G0438]

## 2010-12-23 NOTE — Assessment & Plan Note (Signed)
Summary: med refill--ch.    History of Present Illness Visit Type: Follow-up Visit Primary GI MD: [name] MD Primary Provider: Jacques Navy MD Requesting Provider: na Chief Complaint: F/u for GERD. Pt denies any GI complaints. Pt needs refill on Omeprazole  History of Present Illness:   This is a 70 year old white female with gastroesophageal reflux controlled on omeprazole 20 mg once or twice a day. She comes for refills of her medication. She has changed her insurance carrier and now needs a new 90 day supply of omeprazole. She is complaining of occasional solid food dysphagia, mostly to bread which started after her cervical neck surgery 3 years ago. She also has some pill dysphagia. There is a family history of colon cancer in her father. Her last colonoscopy was in April 2010. She will be due for a recall colonoscopy in April 2015.   GI Review of Systems      Denies abdominal pain, acid reflux, belching, bloating, chest pain, dysphagia with liquids, dysphagia with solids, heartburn, loss of appetite, nausea, vomiting, vomiting blood, weight loss, and  weight gain.        Denies anal fissure, black tarry stools, change in bowel habit, constipation, diarrhea, diverticulosis, fecal incontinence, heme positive stool, hemorrhoids, irritable bowel syndrome, jaundice, light color stool, liver problems, rectal bleeding, and  rectal pain.    Current Medications (verified): 1)  Alendronate Sodium 70 Mg Tabs (Alendronate Sodium) .Marland Kitchen.. 1 Weekly 2)  Albuterol 90 Mcg/act  Aers (Albuterol) .... As Needed 3)  Centrum Silver  Tabs (Multiple Vitamins-Minerals) .... Once Daily 4)  Aspirin Ec Low Dose 81 Mg  Tbec (Aspirin) .Marland Kitchen.. 1 Once Daily 5)  Caltrate 600+d 600-400 Mg-Unit  Tabs (Calcium Carbonate-Vitamin D) .... Two Times A Day 6)  Omeprazole 40 Mg Cpdr (Omeprazole) .... Take 1 Tablet By Mouth Once A Day. Must Have Office Visit For Further Refills! ~ 7)  Vitamin E 600 Unit  Caps (Vitamin E)  .... One Tablet By Mouth Once Daily  Allergies (verified): 1)  * Ct Dye  Past History:  Past Medical History: CALLUS, TOE (ICD-700) HIP PAIN, RIGHT (ICD-719.45) ELBOW PAIN, LEFT (ICD-719.42) CELLULITIS AND ABSCESS OF OTHER SPECIFIED SITE [phone]) ATAXIA (ICD-781.3) CERVICAL RADICULOPATHY, RIGHT (ICD-723.4) EMPHYSEMA (ICD-492.8) DIVERTICULOSIS OF COLON (ICD-562.10) RENAL CYST (ICD-593.2) OSTEOPENIA (ICD-733.90) GERD (ICD-530.81) ASTHMA (ICD-493.90) ANEMIA-IRON DEFICIENCY (ICD-280.9)  Past Surgical History: Reviewed history from 12/03/2009 and no changes required. Tonsillectomy Breast biopsies- both benign C-spine surgery '10 [facility])  Family History: Reviewed history from 08/14/2008 and no changes required. father-died 24, colon cancer, CAD/MI mother -died 83, pancreatic cancer,  Paternal aunt, paternal GM with colon cancer paternal aunt - breast cancer maternal aunt with ovarian cancer Family History of Heart Disease: Father Family History of Ovarian Cancer: Maternal Aunt  Social History: Reviewed history from 12/03/2009 and no changes required. 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 married - 1965 3 sons - '66, '69, '70 Son in good health Patient has never smoked.  Alcohol Use - no Daily Caffeine Use-2-3 daily Illicit Drug Use - no  Review of Systems       The patient complains of anemia.  The patient denies allergy/sinus, anxiety-new, arthritis/joint pain, back pain, blood in urine, breast changes/lumps, change in vision, confusion, cough, coughing up blood, depression-new, fainting, fatigue, fever, headaches-new, hearing problems, heart murmur, heart rhythm changes, itching, menstrual pain, muscle pains/cramps, night sweats, nosebleeds, pregnancy symptoms, shortness of breath, skin rash, sleeping problems, sore throat, swelling of feet/legs, swollen  lymph glands, thirst - excessive , urination - excessive , urination changes/pain, urine  leakage, vision changes, and voice change.         Pertinent positive and negative review of systems were noted in the above HPI. All other ROS was otherwise negative.   Vital Signs:  Patient profile:   70 year old female Height:      60 inches Weight:      92.4 pounds BMI:     18.11 Pulse rate:   76 / minute Pulse rhythm:   regular BP sitting:   126 / 80  (left arm) Cuff size:   regular  Vitals Entered By: Ok Anis CMA (November 23, 2010 4:50 PM)       Physical Exam  General:  Well developed, well nourished, no acute distress. Eyes:  PERRLA, no icterus. Mouth:  No deformity or lesions, dentition normal. Lungs:  Clear throughout to auscultation. Heart:  Regular rate and rhythm; no murmurs, rubs,  or bruits. Abdomen:  scaphoid abdomen, soft and nontender with normoactive bowel sounds. Liver edge at costal margin. No tympany Extremities:  No clubbing, cyanosis, edema or deformities noted. Skin:  Intact without significant lesions or rashes. Psych:  Alert and cooperative. Normal mood and affect.   Impression & Recommendations:  Problem # 1:  CERVICAL RADICULOPATHY, RIGHT (ICD-723.4) I suspect mild esophageal dysmotility since cervical spine surgery.  Problem # 2:  GERD (ICD-530.81) controlled with omeprazole 20 mg once or twice a day. We will send refills.  Problem # 3:  DIVERTICULOSIS OF COLON (ICD-562.10) Patient has a family history of colon cancer in his father. Her last colonoscopy was in April 2010. A recall colonoscopy will be due in April 2015.  Patient Instructions: 1)  Please wait for your prescriptions from the mail in pharmacy. Electronic prescription(s) has already been sent. 2)  if dysphagia becomes supple bowel sounds consider barium esophagram to assess motility  3)  Copy sent to : Illene Regulus, MD 4)  The medication list was reviewed and reconciled.  All changed / newly prescribed medications were explained.  A complete medication list was provided to  the patient / caregiver. Prescriptions: OMEPRAZOLE 40 MG CPDR (OMEPRAZOLE) Take 1 tablet by mouth two times a day  #180 x 2   Entered by:   Lamona Curl CMA (AAMA)   Authorized by:   Hart Carwin MD   Signed by:   Lamona Curl CMA (AAMA) on 11/23/2010   Method used:   Faxed to ...       [facility])       [address] [address]       [place], [place]  [address]       Ph: [phone]       Fax: [phone]   RxID:   [id number]

## 2011-03-07 LAB — BASIC METABOLIC PANEL
BUN: 12 mg/dL (ref 6–23)
CO2: 27 mEq/L (ref 19–32)
Calcium: 9.2 mg/dL (ref 8.4–10.5)
Chloride: 107 mEq/L (ref 96–112)
Creatinine, Ser: 0.74 mg/dL (ref 0.4–1.2)
GFR calc Af Amer: >60 mL/min (ref >60)
GFR calc non Af Amer: >60 mL/min (ref >60)
Glucose, Bld: 95 mg/dL (ref 70–99)
Potassium: 4 mEq/L (ref 3.5–5.1)
Sodium: 141 mEq/L (ref 135–145)

## 2011-03-07 LAB — CBC
HCT: 39.6 % (ref 36.0–46.0)
Hemoglobin: 13.4 g/dL (ref 12.0–15.0)
MCHC: 34 g/dL (ref 30.0–36.0)
MCV: 94.4 fL (ref 78.0–100.0)
Platelets: 302 10*3/uL (ref 150–400)
RBC: 4.19 MIL/uL (ref 3.87–5.11)
RDW: 13.5 % (ref 11.5–15.5)
WBC: 7.6 10*3/uL (ref 4.0–10.5)

## 2011-04-05 NOTE — Assessment & Plan Note (Signed)
[facility]                         GASTROENTEROLOGY OFFICE NOTE   MAEVEN, MCDOUGALL                     MRN:          [id number]  DATE:01/18/2008                            DOB:          10-22-1941    Kristine Francis is a 70 year old white female with history of  gastroesophageal reflux, negative 24-hour intraesophageal pH in 2000,  but severe coughing spell, evaluated by Kristine Francis, in 2004 and 2005.  She  is followed by Dr. Debby Bud.  Kristine Francis is now complaining of progression  of her cough, as well as regurgitation of food particles, even though  she may not eat for several hours prior to that.  She is expectorating  brownish pieces of material, like chopped meat, but denies any  heartburn.  There has been no dysphagia.  Last upper endoscopy was done  in 2000 and showed normal esophagus, stomach and duodenum.  There was no  stricture.  A CLOtest was negative.  She has a history of asthmatic  bronchitis as a child and, on last chest x-ray from 2005, there were  changes in both apices of emphysema.  Patient never smoked.   From the GI standpoint, Kristine Francis has a family history of colon cancer  in her father and has undergone numerous colonoscopies, in 1991, 1993,  1997, 2000 and May 2005.  She is due for repeat colonoscopy May 2010.  Dr. Larey Dresser has been following her renal cyst, which has been  stable over the past ten years.  She temporarily responded with her  cough to chewing on ice and taking some cough suppressants.  Her weight  has been rather low, but stable.   MEDICATIONS:  1. Fosamax 70 mg weekly.  2. Calcium with vitamin D.  3. Prevacid 30 mg q.a.m.  4. Aspirin 81 mg p.o. daily.  5. Multiple vitamins.   PAST HISTORY:  Allergies, kidney lesion, asthmatic bronchitis.   FAMILY HISTORY:  Positive for pancreatic surgery on mother, colon cancer  on father, thyroid problems in mother, heart attack in father, ovarian  cancer in  maternal aunt and breast cancer in paternal aunt.   SOCIAL HISTORY:  Married with three children.  She is just retired from  [employer].  She does not smoke and never smoked and  does not drink alcohol.   REVIEW OF SYSTEMS:  Weight has been stable.  She wears glasses.  She has  allergies, frequent cough, urination, leakage of urine.   PHYSICAL EXAM:  Blood pressure not recorded, weight 99.8 pounds.  Oral cavity was normal.  The neck was supple, no adenopathy.  LUNGS:  With normal breath sounds, no wheezes or rales.  COR:  With quiet precordium, rapid S1 and S2.  ABDOMEN:  Soft, nontender with normoactive bowel sounds, no distention,  liver edge at costal margin, no scars.  RECTAL EXAM:  With soft, Hemoccult-negative stool.  EXTREMITIES:  No edema.  Her voice was normal, but she admitted to having hoarseness  intermittently.   IMPRESSION:  1. Patient is a 70 year old white female with chronic cough and      history  of gastroesophageal reflux.  It is not clear, at this      point, whether the cough preceded the reflux or whether the reflux      preceded the cough.  Nevertheless, both conditions are aggravating      each other.  There has been definite progression of her cough, as      well as of her reflux to the point where she is expectorating food      particles.  This raises the question of gastric retention or      gastroparesis or possibly Barrett's esophagus.  She is not dyspneic      on exertion, since she is reporting daily walks with her dog and      not experiencing any dyspnea on exertion.  We need to rule out      interim development of Barrett's esophagus or possible development      of hiatal hernia.  2. Family history of colon cancer in her father.  She is due for      colonoscopy in May 2010.   PLAN:  1. Upper endoscopy scheduled with appropriate biopsies.  2. Increase her PPI to b.i.d. dose.  3. Tussionex a half to one teaspoon q.h.s. to  suppress cough.  4. Kapidex 60 mg daily.  5. Consider gastric emptying scan to assess gastric emptying, based on      the results of the upper endoscopy.     Kristine Morton. Juanda Chance, MD  Electronically Signed    DMB/MedQ  DD: 01/18/2008  DT: 01/18/2008  Job #: [id number]   cc:   Rosalyn Gess. Norins, MD  Charlaine Dalton. Sherene Sires, MD, FCCP

## 2011-04-05 NOTE — Op Note (Signed)
[name], Kristine Francis              ACCOUNT NO.:  [id number]   MEDICAL RECORD NO.:  [id number]          PATIENT TYPE:  INP   LOCATION:  3020                         FACILITY:  [facility]   PHYSICIAN:  Hilda Lias, M.D.   DATE OF BIRTH:  [birth year]   DATE OF PROCEDURE:  11/28/2008  DATE OF DISCHARGE:                               OPERATIVE REPORT   PREOPERATIVE DIAGNOSIS:  Cervical spondylosis C4-5, C5-6, C6-7 with  chronic neck pain and right upper arm radiculopathy.   POSTOPERATIVE DIAGNOSIS:  Cervical spondylosis C4-5, C5-6, C6-7 with  chronic neck pain and right  upper arm radiculopathy.   PROCEDURE:  C4-5, C5-6, and C6-7 diskectomy, decompression of the spinal  cord, bilateral foraminotomy, interbody fusion with allograft and  autograft plate, and microscope.   SURGEON:  Hilda Lias, MD   ASSISTANT:  Cristi Loron, MD   CLINICAL HISTORY:  Ms. Derks is a lady who had been complaining of neck  pain worsened to the right upper extremity for several months.  She had  failed conservative treatment.  X-rays showed that she has severe  degenerative disk disease at C4-5, C5-6, and C6-7.  Surgery was advised.   PROCEDURE:  The patient was taken to the OR and after intubation, the  left side of the neck was cleaned with DuraPrep.  Transverse incision  was made through the skin and subcutaneous tissues down to the cervical  area.  X-rays showed that the needle was below  C4-5.  From then on, we  removed the anterior osteophyte at C4-5, C5-6, and C6-7.  We entered the  disk space after we opened the anterior ligament with the microscope.  The patient had quite a bit of degenerative disk disease.  Diskectomy  was accomplished.  We opened the posterior ligament and decompression of  the spinal cord as well as the both C5 nerve root was achieved.  The  same procedure was done at the level of C5-6 and C6-7 with the same  finding of spondylosis and narrowing of the foramen.  At the  end, we had  good decompression at those three levels.  The endplates were drilled  and three pieces of allograft with autograft in size of  6 mm height,  lordotic were introduced.  Then this was followed with a plate using  eight screws.  Lateral and cervical spine showed good position of the  graft and the plate.  Hemostasis was done.  A drain was left in the  paracervical area and the wound was closed with Vicryl and Steri-Strips.           ______________________________  Hilda Lias, M.D.     EB/MEDQ  D:  11/28/2008  T:  11/29/2008  Job:  [id number]

## 2011-04-08 NOTE — Letter (Signed)
February 22, 2007    Melvyn Novas, M.D.  [address]  [address]  [place], [place] [address]   RE:  Kristine Francis, Kristine Francis  MRN:  [id number]  /  DOB:  [birth year]   Dear Porfirio Mylar,   Thank you very much for seeing Ms. Jena Gauss, a delightful 70-year-  old woman, for problems with ataxia.   Ms. Pitkin has had a problem with some minor balance disorder for some  type.  Recently, she had an episode where she stood up from her desk,  turned to go to a copier, and actually just fell over.  She had no  dizziness.  She had no prodroma.  She had no lightheadedness.  She has  had no prior symptoms of orthostatic hypotension or positional vertigo.  The patient denies any dizziness.  She has had no dysequilibrium.  She  has had no ear problems.  She has had no double vision.  She has had no  other systemic symptoms.   On examination in the office, the patient was found to have cranial  nerves II-XII to be grossly intact, with normal facial symmetry and  movement.  Extraocular muscles were intact.  On cerebellar functioning,  she had normal finger-to-nose maneuver, normal rapid finger movement.  She had no dysdiadochokinesia.  On gait, she has a very rapid gait, but  does track a little bit to the left.  When asked to tandem gait, she had  great difficulty doing tandem gait, with fall to the left.   The patient's chart was reviewed, and she did have an MRI of the brain  in 2004 performed at [facility].  This was done to rule out MS  because of some visual changes and problems, and it was read out as a  normal study.   CURRENT MEDICATIONS:  1. Fosamax 70 mg weekly.  2. Calcium with D.  3. Prevacid 30 mg daily.  4. Iron twice a week.  5. Femhrt.   I have enclosed with this letter my complete dictation from April 24, 2006, which does give her complete past medical history and physical  exam at that time, and I am also enclosing my dictated note from November 29, 2006, which was a  limited physical exam for bronchitis but does give  a recent summarization of her health history.   The patient has had multiple evaluations for abdominal pain and  discomfort, but she has had no other neurologic evaluations and no other  neuroimaging studies.  She has had a normal EKG as of Apr 12, 2005, and  has no history of cardiovascular disease.  The patient has had  peripheral vascular studies which were unremarkable.  The patient has  had no recent hospitalizations.   If I can provide any additional information, please do not hesitate to  contact me.   As always, I appreciate your assistance in the evaluation of patients I  find complicated and difficult to diagnose.    Sincerely,      Rosalyn Gess. Norins, MD  Electronically Signed    MEN/MedQ  DD: 02/22/2007  DT: 02/22/2007  Job #: [id number]

## 2011-04-08 NOTE — Assessment & Plan Note (Signed)
[facility]                           PRIMARY CARE OFFICE NOTE   XITLALLY, MOONEYHAM                     MRN:          [id number]  DATE:11/29/2006                            DOB:          10/26/1941    SUBJECTIVE:  Ms. Hoston was seen one week ago today.  She presented for  a persistent non-productive hacking cough.  At that evaluation she was  not felt to have any active bacterial infection.  She continued with  supportive care using Mucinex, Delsym and Tylenol for discomfort.   The patient reports that over the past several days she has had an  increase in her cough.  It is now productive, producing a dark green  thick sputum from her chest.  She has also continued to have rhinorrhea  of a yellowish/green material that is salty in nature.  She has had some  hard chills but no rigors.  She has had no fever that has been  documented.  She does continue to have a cough that is persistent and  worse during the daytime.  She does admit that this is somewhat of an  irritative cough.   CURRENT MEDICATIONS:  1. Prevacid 30 mg daily.  2. estrogen/progesterone 0.5/2.5 mg daily.  3. Fosamax 70 mg weekly.   REVIEW OF SYSTEMS:  Positive for constitutional symptoms, as above.  No  ophthalmology, ENT, cardiovascular or GI complaints.   PHYSICAL EXAMINATION:  VITAL SIGNS:  Temperature 98.2 degrees, blood  pressure 120/75, pulse 85, weight 103 pounds.  GENERAL:  This is a somewhat worn-appearing woman, who is in no acute  distress.  CHEST:  The patient is moving air well.  There are no rales, no wheezes,  no decreased breath sounds.  No egophony.  HEART:  Rate was regular, without tachycardia.   ASSESSMENT/PLAN:  Bronchitis:  The patient has had a progression in her  respiratory symptoms, to now a productive cough of a purulent material,  very suggestive of bronchitis.  The patient is started on Biaxin 500 mg  b.i.d. for seven days.  Tessalon Perles 100  mg t.i.d. for irritative  cough.  She is to continue with the Delsym, Mucinex, hydration and  Tylenol.     Rosalyn Gess Norins, MD  Electronically Signed    MEN/MedQ  DD: 11/29/2006  DT: 11/29/2006  Job #: [id number]

## 2011-08-24 LAB — POCT I-STAT, CHEM 8
BUN: 14
Calcium, Ion: 1.1 — ABNORMAL LOW
Chloride: 106
Creatinine, Ser: 1
Glucose, Bld: 96
HCT: 39
Hemoglobin: 13.3
Potassium: 3.7
Sodium: 141
TCO2: 25

## 2011-08-24 LAB — CBC
HCT: 39.9
Hemoglobin: 13.8
MCHC: 34.7
MCV: 94.6
Platelets: 251
RBC: 4.22
RDW: 13.3
WBC: 5.1

## 2011-08-24 LAB — DIFFERENTIAL
Basophils Absolute: 0
Basophils Relative: 1
Eosinophils Absolute: 0.1
Eosinophils Relative: 2
Lymphocytes Relative: 25
Lymphs Abs: 1.3
Monocytes Absolute: 0.4
Monocytes Relative: 8
Neutro Abs: 3.3
Neutrophils Relative %: 65

## 2011-08-24 LAB — D-DIMER, QUANTITATIVE (NOT AT ARMC): D-Dimer, Quant: <0.22

## 2011-08-24 LAB — POCT CARDIAC MARKERS
CKMB, poc: 1.9
CKMB, poc: 2.2
Myoglobin, poc: 78.2
Myoglobin, poc: 80.5
Troponin i, poc: <0.05
Troponin i, poc: <0.05

## 2011-08-29 ENCOUNTER — Telehealth: Payer: Self-pay | Admitting: *Deleted

## 2011-08-29 MED ORDER — ALENDRONATE SODIUM 70 MG PO TABS: 70.0000 mg | ORAL_TABLET | ORAL | Status: DC

## 2011-08-29 NOTE — Telephone Encounter (Signed)
Patient requesting RF of alendronate to go to [facility]. Fax to [phone]. Please call when complete.

## 2011-08-31 ENCOUNTER — Ambulatory Visit (INDEPENDENT_AMBULATORY_CARE_PROVIDER_SITE_OTHER): Payer: Medicare Other | Admitting: *Deleted

## 2011-08-31 DIAGNOSIS — Z23 Encounter for immunization: Secondary | ICD-10-CM

## 2011-09-30 ENCOUNTER — Encounter: Payer: Self-pay | Admitting: Internal Medicine

## 2011-09-30 ENCOUNTER — Ambulatory Visit (INDEPENDENT_AMBULATORY_CARE_PROVIDER_SITE_OTHER): Payer: Medicare Other | Admitting: Internal Medicine

## 2011-09-30 VITALS — BP 110/60 | HR 72 | Ht 61.0 in | Wt 94.0 lb

## 2011-09-30 DIAGNOSIS — R195 Other fecal abnormalities: Secondary | ICD-10-CM

## 2011-09-30 DIAGNOSIS — K921 Melena: Secondary | ICD-10-CM

## 2011-09-30 NOTE — Patient Instructions (Addendum)
Nothing needed at this time, Kaopectate contains Bismuth compound which causes black tongue and black bowl movement.

## 2011-09-30 NOTE — Progress Notes (Signed)
Kristine Francis [birth year] MRN [id number]  History of Present Illness:  This is a 70 year old white female who has chronic gastroesophageal reflux controlled on omeprazole 40 mg a day and a family history of colon cancer in her father. Her last colonoscopy was in April 2010. She is here today because of a large black stool 3 weeks ago. The episode was preceded by acute diarrhea which developed after eating a steak and cheese sandwich. She took Kaopectate 2 capfuls with good results. She noticed a black tarry bowel movement 24 hours later. She denies any bright red blood per rectum. She has a history of iron deficiency anemia. She denies abdominal pain. Her bowel habits are back to normal.   Past Medical History  Diagnosis Date  . Ataxia   . Emphysema   . Diverticulosis   . Renal cyst   . Osteopenia   . GERD (gastroesophageal reflux disease)   . Asthma   . Iron deficiency anemia, unspecified    Past Surgical History  Procedure Date  . Tonsillectomy   . Breast biopsy   . Cervical spine surgery 2010    reports that she has never smoked. She has never used smokeless tobacco. She reports that she does not drink alcohol or use illicit drugs. family history includes Breast cancer in her paternal aunt; Colon cancer in her father, paternal aunt, and paternal grandmother; Coronary artery disease in her father; Ovarian cancer in her maternal aunt; and Pancreatic cancer in her mother. Allergies  Allergen Reactions  . Iohexol         Review of Systems: Denies dysphagia, odynophagia, shortness of breath or chest pain  The remainder of the 10 point ROS is negative except as outlined in H&P   Physical Exam: General appearance  Well developed, in no distress. Eyes- non icteric. HEENT nontraumatic, normocephalic. Mouth no lesions, tongue papillated, no cheilosis. Neck supple without adenopathy, thyroid not enlarged, no carotid bruits, no JVD. Lungs Clear to auscultation bilaterally. Cor  normal S1, normal S2, regular rhythm, no murmur,  quiet precordium. Abdomen: Soft with increased tympany. Normoactive bowel sounds. No tenderness or mass. Rectal: Brown Hemoccult-negative stool. Extremities no pedal edema. Skin no lesions. Neurological alert and oriented x 3. Psychological normal mood and affect.  Assessment and Plan:  Problem #1 Episode of black stools attributed to bismuth which is contained in Kaopectate. Patient was not aware of it and thought she was bleeding. Her exam today is negative and the stool is Hemoccult negative as well. Her next colonoscopy will be in April 2015. She will continue her treatment for gastroesophageal reflux with omeprazole 40 mg daily.   09/30/2011 Kristine Francis

## 2011-10-14 ENCOUNTER — Encounter: Payer: Self-pay | Admitting: Internal Medicine

## 2011-10-14 ENCOUNTER — Ambulatory Visit (INDEPENDENT_AMBULATORY_CARE_PROVIDER_SITE_OTHER): Payer: Medicare Other | Admitting: Internal Medicine

## 2011-10-14 VITALS — BP 118/72 | HR 64 | Temp 97.4°F | Wt 95.5 lb

## 2011-10-14 DIAGNOSIS — T23009A Burn of unspecified degree of unspecified hand, unspecified site, initial encounter: Secondary | ICD-10-CM | POA: Insufficient documentation

## 2011-10-14 MED ORDER — SILVER SULFADIAZINE 1 % EX CREA
TOPICAL_CREAM | CUTANEOUS | Status: DC
Start: 2011-10-14 — End: 2012-07-04

## 2011-10-14 MED ORDER — SILVER SULFADIAZINE 1 % EX CREA: TOPICAL_CREAM | CUTANEOUS | Status: DC

## 2011-10-14 NOTE — Patient Instructions (Signed)
Take all new medications as prescribed Continue all other medications as before  

## 2011-10-15 ENCOUNTER — Encounter: Payer: Self-pay | Admitting: Internal Medicine

## 2011-10-15 NOTE — Assessment & Plan Note (Signed)
Improved with initial home tx, no evidence of cellulitis, will update silvadene rx,  to f/u any worsening symptoms or concerns

## 2011-10-15 NOTE — Progress Notes (Signed)
Subjective:    Patient ID: Kristine Francis, female    DOB: Apr 30, 1941, 70 y.o.   MRN: [id number]  HPI  Here to f/u after accidentally burning right medial wrist with a touch while cooking to hot stove 4 days ago.  Was initially quite painful, tried initial antibacterial then found leftover silvadene from 12 yrs ago and has used that.  Has nicely improved overall with scabbing over, pain much improved, and no significant swelling, drainage.   Pt denies fever, wt loss, night sweats, loss of appetite, or other constitutional symptoms.  Pt denies chest pain, increased sob or doe, wheezing, orthopnea, PND, increased LE swelling, palpitations, dizziness or syncope.  Pt denies new neurological symptoms such as new headache, or facial or extremity weakness or numbness Past Medical History  Diagnosis Date  . Ataxia   . Emphysema   . Diverticulosis   . Renal cyst   . Osteopenia   . GERD (gastroesophageal reflux disease)   . Asthma   . Iron deficiency anemia, unspecified    Past Surgical History  Procedure Date  . Tonsillectomy   . Breast biopsy   . Cervical spine surgery 2010    reports that she has never smoked. She has never used smokeless tobacco. She reports that she does not drink alcohol or use illicit drugs. family history includes Breast cancer in her paternal aunt; Colon cancer in her father, paternal aunt, and paternal grandmother; Coronary artery disease in her father; Ovarian cancer in her maternal aunt; and Pancreatic cancer in her mother. Allergies  Allergen Reactions  . Iohexol    Current Outpatient Prescriptions on File Prior to Visit  Medication Sig Dispense Refill  . albuterol (PROVENTIL,VENTOLIN) 90 MCG/ACT inhaler Inhale 2 puffs into the lungs every 6 (six) hours as needed.        Marland Kitchen alendronate (FOSAMAX) 70 MG tablet Take 1 tablet (70 mg total) by mouth every 7 (seven) days. Take with a full glass of water on an empty stomach.  12 tablet  3  . aspirin 81 MG tablet Take 81  mg by mouth daily.        . Calcium Carbonate-Vitamin D (SM CALCIUM-VITAMIN D) 600-400 MG-UNIT per tablet Take 1 tablet by mouth daily.        . Multiple Vitamins-Minerals (CENTRUM SILVER PO) Take 1 capsule by mouth daily.        Marland Kitchen omeprazole (PRILOSEC) 40 MG capsule Take 40 mg by mouth daily.        . vitamin E 600 UNIT capsule Take 600 Units by mouth daily.         Review of Systems Review of Systems  Constitutional: Negative for diaphoresis and unexpected weight change.  HENT: Negative for drooling and tinnitus.   Eyes: Negative for photophobia and visual disturbance.  Respiratory: Negative for choking and stridor.   Gastrointestinal: Negative for vomiting and blood in stool.    Objective:   Physical Exam BP 118/72  Pulse 64  Temp(Src) 97.4 F (36.3 C) (Oral)  Wt 95 lb 8 oz (43.319 kg)  SpO2 99% Physical Exam  VS noted Constitutional: Pt appears well-developed and well-nourished.  HENT: Head: Normocephalic.  Right Ear: External ear normal.  Left Ear: External ear normal.  Eyes: Conjunctivae and EOM are normal. Pupils are equal, round, and reactive to light.  Neck: Normal range of motion. Neck supple.  Cardiovascular: Normal rate and regular rhythm.   Pulmonary/Chest: Effort normal and breath sounds normal.  Skin: Skin is warm. No  erythema. medial wrist with 1/2 x 1.5 cm area superficial burn nicely scabbed over without bleb, drainage, red streaks or significiant swelling Psychiatric: Pt behavior is normal. Thought content normal.     Assessment & Plan:

## 2011-11-29 DIAGNOSIS — J309 Allergic rhinitis, unspecified: Secondary | ICD-10-CM | POA: Diagnosis not present

## 2011-12-08 DIAGNOSIS — J309 Allergic rhinitis, unspecified: Secondary | ICD-10-CM | POA: Diagnosis not present

## 2011-12-09 ENCOUNTER — Encounter: Payer: Self-pay | Admitting: Internal Medicine

## 2011-12-12 ENCOUNTER — Encounter: Payer: Self-pay | Admitting: Internal Medicine

## 2011-12-12 ENCOUNTER — Other Ambulatory Visit (INDEPENDENT_AMBULATORY_CARE_PROVIDER_SITE_OTHER): Payer: Medicare Other

## 2011-12-12 ENCOUNTER — Ambulatory Visit (INDEPENDENT_AMBULATORY_CARE_PROVIDER_SITE_OTHER): Payer: Medicare Other | Admitting: Internal Medicine

## 2011-12-12 VITALS — BP 160/108 | HR 80 | Temp 97.0°F | Resp 16 | Ht 61.0 in | Wt 94.0 lb

## 2011-12-12 DIAGNOSIS — J45909 Unspecified asthma, uncomplicated: Secondary | ICD-10-CM

## 2011-12-12 DIAGNOSIS — H539 Unspecified visual disturbance: Secondary | ICD-10-CM

## 2011-12-12 DIAGNOSIS — D509 Iron deficiency anemia, unspecified: Secondary | ICD-10-CM

## 2011-12-12 DIAGNOSIS — R252 Cramp and spasm: Secondary | ICD-10-CM | POA: Diagnosis not present

## 2011-12-12 DIAGNOSIS — M25559 Pain in unspecified hip: Secondary | ICD-10-CM | POA: Diagnosis not present

## 2011-12-12 DIAGNOSIS — Z0000 Encounter for general adult medical examination without abnormal findings: Secondary | ICD-10-CM | POA: Diagnosis not present

## 2011-12-12 LAB — COMPREHENSIVE METABOLIC PANEL
ALT: 24 U/L (ref 0–35)
AST: 29 U/L (ref 0–37)
Albumin: 4.2 g/dL (ref 3.5–5.2)
Alkaline Phosphatase: 50 U/L (ref 39–117)
BUN: 17 mg/dL (ref 6–23)
CO2: 31 mEq/L (ref 19–32)
Calcium: 9.6 mg/dL (ref 8.4–10.5)
Chloride: 100 mEq/L (ref 96–112)
Creatinine, Ser: 0.6 mg/dL (ref 0.4–1.2)
GFR: 109.36 mL/min (ref >60.00)
Glucose, Bld: 114 mg/dL — ABNORMAL HIGH (ref 70–99)
Potassium: 4.2 mEq/L (ref 3.5–5.1)
Sodium: 141 mEq/L (ref 135–145)
Total Bilirubin: 0.8 mg/dL (ref 0.3–1.2)
Total Protein: 6.9 g/dL (ref 6.0–8.3)

## 2011-12-12 LAB — CBC WITH DIFFERENTIAL/PLATELET
Basophils Absolute: 0 10*3/uL (ref 0.0–0.1)
Basophils Relative: 0.4 % (ref 0.0–3.0)
Eosinophils Absolute: 0.1 10*3/uL (ref 0.0–0.7)
Eosinophils Relative: 2.5 % (ref 0.0–5.0)
HCT: 38.7 % (ref 36.0–46.0)
Hemoglobin: 13 g/dL (ref 12.0–15.0)
Lymphocytes Relative: 29.8 % (ref 12.0–46.0)
Lymphs Abs: 1.8 10*3/uL (ref 0.7–4.0)
MCHC: 33.7 g/dL (ref 30.0–36.0)
MCV: 92.2 fl (ref 78.0–100.0)
Monocytes Absolute: 0.6 10*3/uL (ref 0.1–1.0)
Monocytes Relative: 10.2 % (ref 3.0–12.0)
Neutro Abs: 3.4 10*3/uL (ref 1.4–7.7)
Neutrophils Relative %: 57.1 % (ref 43.0–77.0)
Platelets: 260 10*3/uL (ref 150.0–400.0)
RBC: 4.2 Mil/uL (ref 3.87–5.11)
RDW: 14 % (ref 11.5–14.6)
WBC: 5.9 10*3/uL (ref 4.5–10.5)

## 2011-12-12 MED ORDER — ZOSTER VACCINE LIVE 19400 UNT/0.65ML ~~LOC~~ SOLR: 0.6500 mL | Freq: Once | SUBCUTANEOUS | Status: AC

## 2011-12-12 NOTE — Patient Instructions (Signed)
For neck pain - range of motion exercise. For hip pain - try a pillow between your legs at night; try a NSAID, e.g. Aleve, at bedtime Vertical vision movement - no visible nystagmus. Plan - referral back to Dr. Marylou Flesher for evaluation. Routine labs today.

## 2011-12-12 NOTE — Progress Notes (Signed)
Subjective:    Patient ID: Kristine Francis, female    DOB: 01-09-1941, 71 y.o.   MRN: [id number]  HPI The patient is here for annual Medicare wellness examination and management of other chronic and acute problems. She has been feeling well. She has seen Dr. Juanda Chance at the end of the year and saw Dr. Nira Retort at the end of the year.    The risk factors are reflected in the social history.  The roster of all physicians providing medical care to patient - is listed in the Snapshot section of the chart.  Activities of daily living:  The patient is 100% inedpendent in all ADLs: dressing, toileting, feeding as well as independent mobility  Home safety : The patient has no smoke detectors in the home.  Fall - has made house safe re: rugs, etc. No grab bars.They wear seatbelts. No firearms at home. There is no violence in the home.   There is no risks for hepatitis, STDs or HIV. There is no history of blood transfusion. They have no travel history to infectious disease endemic areas of the world.  The patient has seen their dentist in the last six month. They have seen their eye doctor in the last year. They deny any hearing difficulty and have not had audiologic testing in the last year.  They do not  have excessive sun exposure. Discussed the need for sun protection: hats, long sleeves and use of sunscreen if there is significant sun exposure.   Diet: the importance of a healthy diet is discussed. They do have a healthy diet.  The patient has a regular exercise program: walking , 30 min duration, 7 per week.  The benefits of regular aerobic exercise were discussed.  Depression screen: there are no signs or vegative symptoms of depression- irritability, change in appetite, anhedonia, sadness/tearfullness.  Cognitive assessment: the patient manages all their financial and personal affairs and is actively engaged.   The following portions of the patient's history were reviewed and updated as  appropriate: allergies, current medications, past family history, past medical history,  past surgical history, past social history  and problem list.  Vision, hearing, body mass index were assessed and reviewed.   CC; 1. Pain in the right neck-posterior. She does take APAP at night that sometimes helps. She did have a fall last summer but does not seem to be related 2. Left hip pain - radiates down to the thigh. She is concerned for alendronate related pain. 3. Balance problems - describes vertical nystagmus type sensation. She has had a normal opthal exam.   During the course of the visit the patient was educated and counseled about appropriate screening and preventive services including : fall prevention , diabetes screening, nutrition counseling, colorectal cancer screening, and recommended immunizations.    Review of Systems Constitutional:  Negative for fever, chills, activity change and unexpected weight change.  HEENT:  Negative for hearing loss, ear pain, congestion, neck stiffness and postnasal drip. Negative for sore throat or swallowing problems. Negative for dental complaints.   Eyes: Negative for vision loss or change in visual acuity.  Respiratory: Negative for chest tightness and wheezing. Negative for DOE.   Cardiovascular: Negative for chest pain or palpitations. NO decreased exercise tolerance Gastrointestinal: No change in bowel habit. No bloating or gas. No reflux or indigestion Genitourinary: Negative for urgency, frequency, flank pain and difficulty urinating.  Musculoskeletal: Negative for myalgias, back pain, arthralgias and gait problem.  Neurological: Negative for dizziness, tremors, weakness  and headaches.  Hematological: Negative for adenopathy.  Psychiatric/Behavioral: Negative for behavioral problems and dysphoric mood.       Objective:   Physical Exam Filed Vitals:   12/12/11 1336  BP: 160/108  Pulse: 80  Temp: 97 F (36.1 C)  Resp: 16   Gen'-  very thin white woman in no distress HEENT - [place]/at, oropharynx with good dentition, no oral lesions Neck - supple w/o thyromegaly Pulm - normal respirations, lungs CTAP Cor- 2+ radial and DP pulses, RRR, no JVD, no carotid bruits,  Abdomen- soft. MSK - normal range of motion neck. Normal ROM left hip Neuro - non-focal exam, no visible nystagmus, PERRLA, Fundi- normal with handheld instrument. Normal gait and station Skin - clear  Lab Results  Component Value Date   WBC 5.9 12/12/2011   HGB 13.0 12/12/2011   HCT 38.7 12/12/2011   PLT 260.0 12/12/2011   GLUCOSE 114* 12/12/2011   CHOL 244* 12/07/2010   TRIG 142.0 12/07/2010   HDL 97.10 12/07/2010   LDLDIRECT 129.3 12/07/2010   LDLCALC 114* 11/26/2008   ALT 24 12/12/2011   AST 29 12/12/2011   NA 141 12/12/2011   K 4.2 12/12/2011   CL 100 12/12/2011   CREATININE 0.6 12/12/2011   BUN 17 12/12/2011   CO2 31 12/12/2011   TSH 2.06 10/16/2007          Assessment & Plan:

## 2011-12-13 DIAGNOSIS — Z0000 Encounter for general adult medical examination without abnormal findings: Secondary | ICD-10-CM | POA: Insufficient documentation

## 2011-12-13 NOTE — Assessment & Plan Note (Signed)
Predominantly c/o hip pain with involvement of buttock and thigh on the left. Good range of motion noted.  Plan - stretch and flex           At night - pillow between knees           APAP plus NSAID at bedtime as needed.

## 2011-12-13 NOTE — Assessment & Plan Note (Signed)
Stable with no complaints of a respiratory nature

## 2011-12-13 NOTE — Assessment & Plan Note (Signed)
Hemoglobin 13.0 g - very robust and normal

## 2011-12-13 NOTE — Assessment & Plan Note (Signed)
Interval medical history notable for left hip,low back and leg pain worse at night. She is current with gyn for exams. Physical exam is normal. Lab results are in normal range. She is current for colorectal cancer screening with last exam Nov '10. Immunizations are up to date except for Shingles vaccine - Rx provided for pharmacy administration.   In summary - a delightful woman who appears to be medically stable except for a nagging pain in the left hip. She takes good care of herself. She will return as needed, particularly if the hip pain persists, otherwise in 1 year.

## 2011-12-19 DIAGNOSIS — J309 Allergic rhinitis, unspecified: Secondary | ICD-10-CM | POA: Diagnosis not present

## 2011-12-26 DIAGNOSIS — J309 Allergic rhinitis, unspecified: Secondary | ICD-10-CM | POA: Diagnosis not present

## 2011-12-28 DIAGNOSIS — J309 Allergic rhinitis, unspecified: Secondary | ICD-10-CM | POA: Diagnosis not present

## 2012-01-03 DIAGNOSIS — J309 Allergic rhinitis, unspecified: Secondary | ICD-10-CM | POA: Diagnosis not present

## 2012-01-04 DIAGNOSIS — R269 Unspecified abnormalities of gait and mobility: Secondary | ICD-10-CM | POA: Diagnosis not present

## 2012-01-04 DIAGNOSIS — H5502 Latent nystagmus: Secondary | ICD-10-CM | POA: Diagnosis not present

## 2012-01-10 DIAGNOSIS — J309 Allergic rhinitis, unspecified: Secondary | ICD-10-CM | POA: Diagnosis not present

## 2012-01-13 ENCOUNTER — Ambulatory Visit: Payer: Medicare Other | Attending: Neurology | Admitting: Physical Therapy

## 2012-01-13 DIAGNOSIS — IMO0001 Reserved for inherently not codable concepts without codable children: Secondary | ICD-10-CM | POA: Diagnosis not present

## 2012-01-13 DIAGNOSIS — R269 Unspecified abnormalities of gait and mobility: Secondary | ICD-10-CM | POA: Diagnosis not present

## 2012-01-17 ENCOUNTER — Ambulatory Visit: Payer: Medicare Other | Admitting: Physical Therapy

## 2012-01-17 DIAGNOSIS — R269 Unspecified abnormalities of gait and mobility: Secondary | ICD-10-CM | POA: Diagnosis not present

## 2012-01-17 DIAGNOSIS — J309 Allergic rhinitis, unspecified: Secondary | ICD-10-CM | POA: Diagnosis not present

## 2012-01-17 DIAGNOSIS — IMO0001 Reserved for inherently not codable concepts without codable children: Secondary | ICD-10-CM | POA: Diagnosis not present

## 2012-01-20 ENCOUNTER — Ambulatory Visit: Payer: Medicare Other | Attending: Neurology | Admitting: Physical Therapy

## 2012-01-20 DIAGNOSIS — IMO0001 Reserved for inherently not codable concepts without codable children: Secondary | ICD-10-CM | POA: Insufficient documentation

## 2012-01-20 DIAGNOSIS — R279 Unspecified lack of coordination: Secondary | ICD-10-CM | POA: Diagnosis not present

## 2012-01-20 DIAGNOSIS — R269 Unspecified abnormalities of gait and mobility: Secondary | ICD-10-CM | POA: Diagnosis not present

## 2012-01-24 ENCOUNTER — Ambulatory Visit: Payer: Medicare Other | Admitting: Physical Therapy

## 2012-01-24 DIAGNOSIS — R279 Unspecified lack of coordination: Secondary | ICD-10-CM | POA: Diagnosis not present

## 2012-01-24 DIAGNOSIS — IMO0001 Reserved for inherently not codable concepts without codable children: Secondary | ICD-10-CM | POA: Diagnosis not present

## 2012-01-24 DIAGNOSIS — R269 Unspecified abnormalities of gait and mobility: Secondary | ICD-10-CM | POA: Diagnosis not present

## 2012-01-24 DIAGNOSIS — J309 Allergic rhinitis, unspecified: Secondary | ICD-10-CM | POA: Diagnosis not present

## 2012-01-27 ENCOUNTER — Ambulatory Visit: Payer: Medicare Other | Admitting: Physical Therapy

## 2012-01-27 DIAGNOSIS — R269 Unspecified abnormalities of gait and mobility: Secondary | ICD-10-CM | POA: Diagnosis not present

## 2012-01-27 DIAGNOSIS — IMO0001 Reserved for inherently not codable concepts without codable children: Secondary | ICD-10-CM | POA: Diagnosis not present

## 2012-01-27 DIAGNOSIS — R279 Unspecified lack of coordination: Secondary | ICD-10-CM | POA: Diagnosis not present

## 2012-01-27 DIAGNOSIS — J309 Allergic rhinitis, unspecified: Secondary | ICD-10-CM | POA: Diagnosis not present

## 2012-01-30 ENCOUNTER — Encounter: Payer: Medicare Other | Admitting: Physical Therapy

## 2012-01-30 ENCOUNTER — Ambulatory Visit: Payer: Medicare Other | Admitting: Physical Therapy

## 2012-01-30 DIAGNOSIS — IMO0001 Reserved for inherently not codable concepts without codable children: Secondary | ICD-10-CM | POA: Diagnosis not present

## 2012-01-30 DIAGNOSIS — H25019 Cortical age-related cataract, unspecified eye: Secondary | ICD-10-CM | POA: Diagnosis not present

## 2012-01-30 DIAGNOSIS — R279 Unspecified lack of coordination: Secondary | ICD-10-CM | POA: Diagnosis not present

## 2012-01-30 DIAGNOSIS — J309 Allergic rhinitis, unspecified: Secondary | ICD-10-CM | POA: Diagnosis not present

## 2012-01-30 DIAGNOSIS — H5200 Hypermetropia, unspecified eye: Secondary | ICD-10-CM | POA: Diagnosis not present

## 2012-01-30 DIAGNOSIS — R269 Unspecified abnormalities of gait and mobility: Secondary | ICD-10-CM | POA: Diagnosis not present

## 2012-01-31 ENCOUNTER — Ambulatory Visit: Payer: Medicare Other | Admitting: Physical Therapy

## 2012-01-31 DIAGNOSIS — R279 Unspecified lack of coordination: Secondary | ICD-10-CM | POA: Diagnosis not present

## 2012-01-31 DIAGNOSIS — R269 Unspecified abnormalities of gait and mobility: Secondary | ICD-10-CM | POA: Diagnosis not present

## 2012-01-31 DIAGNOSIS — IMO0001 Reserved for inherently not codable concepts without codable children: Secondary | ICD-10-CM | POA: Diagnosis not present

## 2012-02-03 DIAGNOSIS — J309 Allergic rhinitis, unspecified: Secondary | ICD-10-CM | POA: Diagnosis not present

## 2012-02-07 ENCOUNTER — Ambulatory Visit: Payer: Medicare Other | Admitting: Physical Therapy

## 2012-02-07 DIAGNOSIS — R269 Unspecified abnormalities of gait and mobility: Secondary | ICD-10-CM | POA: Diagnosis not present

## 2012-02-07 DIAGNOSIS — IMO0001 Reserved for inherently not codable concepts without codable children: Secondary | ICD-10-CM | POA: Diagnosis not present

## 2012-02-07 DIAGNOSIS — R279 Unspecified lack of coordination: Secondary | ICD-10-CM | POA: Diagnosis not present

## 2012-02-10 ENCOUNTER — Ambulatory Visit: Payer: Medicare Other | Admitting: Physical Therapy

## 2012-02-10 DIAGNOSIS — IMO0001 Reserved for inherently not codable concepts without codable children: Secondary | ICD-10-CM | POA: Diagnosis not present

## 2012-02-10 DIAGNOSIS — R269 Unspecified abnormalities of gait and mobility: Secondary | ICD-10-CM | POA: Diagnosis not present

## 2012-02-10 DIAGNOSIS — R279 Unspecified lack of coordination: Secondary | ICD-10-CM | POA: Diagnosis not present

## 2012-02-13 ENCOUNTER — Ambulatory Visit: Payer: Medicare Other | Admitting: Physical Therapy

## 2012-02-13 DIAGNOSIS — J309 Allergic rhinitis, unspecified: Secondary | ICD-10-CM | POA: Diagnosis not present

## 2012-02-13 DIAGNOSIS — R279 Unspecified lack of coordination: Secondary | ICD-10-CM | POA: Diagnosis not present

## 2012-02-13 DIAGNOSIS — R269 Unspecified abnormalities of gait and mobility: Secondary | ICD-10-CM | POA: Diagnosis not present

## 2012-02-13 DIAGNOSIS — IMO0001 Reserved for inherently not codable concepts without codable children: Secondary | ICD-10-CM | POA: Diagnosis not present

## 2012-02-14 ENCOUNTER — Ambulatory Visit: Payer: Medicare Other | Admitting: Physical Therapy

## 2012-02-14 DIAGNOSIS — R269 Unspecified abnormalities of gait and mobility: Secondary | ICD-10-CM | POA: Diagnosis not present

## 2012-02-14 DIAGNOSIS — IMO0001 Reserved for inherently not codable concepts without codable children: Secondary | ICD-10-CM | POA: Diagnosis not present

## 2012-02-14 DIAGNOSIS — R279 Unspecified lack of coordination: Secondary | ICD-10-CM | POA: Diagnosis not present

## 2012-02-17 DIAGNOSIS — H5502 Latent nystagmus: Secondary | ICD-10-CM | POA: Diagnosis not present

## 2012-02-17 DIAGNOSIS — R269 Unspecified abnormalities of gait and mobility: Secondary | ICD-10-CM | POA: Diagnosis not present

## 2012-02-20 ENCOUNTER — Ambulatory Visit: Payer: Medicare Other | Attending: Neurology | Admitting: Physical Therapy

## 2012-02-20 DIAGNOSIS — R279 Unspecified lack of coordination: Secondary | ICD-10-CM | POA: Insufficient documentation

## 2012-02-20 DIAGNOSIS — R269 Unspecified abnormalities of gait and mobility: Secondary | ICD-10-CM | POA: Insufficient documentation

## 2012-02-20 DIAGNOSIS — IMO0001 Reserved for inherently not codable concepts without codable children: Secondary | ICD-10-CM | POA: Diagnosis not present

## 2012-02-24 ENCOUNTER — Ambulatory Visit: Payer: Medicare Other | Admitting: Physical Therapy

## 2012-02-24 DIAGNOSIS — R269 Unspecified abnormalities of gait and mobility: Secondary | ICD-10-CM | POA: Diagnosis not present

## 2012-02-24 DIAGNOSIS — IMO0001 Reserved for inherently not codable concepts without codable children: Secondary | ICD-10-CM | POA: Diagnosis not present

## 2012-02-24 DIAGNOSIS — R279 Unspecified lack of coordination: Secondary | ICD-10-CM | POA: Diagnosis not present

## 2012-02-27 ENCOUNTER — Ambulatory Visit: Payer: Medicare Other | Admitting: Physical Therapy

## 2012-02-27 DIAGNOSIS — R269 Unspecified abnormalities of gait and mobility: Secondary | ICD-10-CM | POA: Diagnosis not present

## 2012-02-27 DIAGNOSIS — J309 Allergic rhinitis, unspecified: Secondary | ICD-10-CM | POA: Diagnosis not present

## 2012-02-27 DIAGNOSIS — R279 Unspecified lack of coordination: Secondary | ICD-10-CM | POA: Diagnosis not present

## 2012-02-27 DIAGNOSIS — IMO0001 Reserved for inherently not codable concepts without codable children: Secondary | ICD-10-CM | POA: Diagnosis not present

## 2012-02-28 DIAGNOSIS — Z1231 Encounter for screening mammogram for malignant neoplasm of breast: Secondary | ICD-10-CM | POA: Diagnosis not present

## 2012-03-02 ENCOUNTER — Ambulatory Visit: Payer: Medicare Other | Admitting: Rehabilitative and Restorative Service Providers"

## 2012-03-02 DIAGNOSIS — R269 Unspecified abnormalities of gait and mobility: Secondary | ICD-10-CM | POA: Diagnosis not present

## 2012-03-02 DIAGNOSIS — IMO0001 Reserved for inherently not codable concepts without codable children: Secondary | ICD-10-CM | POA: Diagnosis not present

## 2012-03-02 DIAGNOSIS — R279 Unspecified lack of coordination: Secondary | ICD-10-CM | POA: Diagnosis not present

## 2012-03-05 ENCOUNTER — Encounter: Payer: Self-pay | Admitting: Internal Medicine

## 2012-03-05 ENCOUNTER — Encounter: Payer: Medicare Other | Admitting: Physical Therapy

## 2012-03-09 ENCOUNTER — Ambulatory Visit: Payer: Medicare Other | Admitting: Physical Therapy

## 2012-03-09 DIAGNOSIS — R269 Unspecified abnormalities of gait and mobility: Secondary | ICD-10-CM | POA: Diagnosis not present

## 2012-03-09 DIAGNOSIS — R279 Unspecified lack of coordination: Secondary | ICD-10-CM | POA: Diagnosis not present

## 2012-03-09 DIAGNOSIS — IMO0001 Reserved for inherently not codable concepts without codable children: Secondary | ICD-10-CM | POA: Diagnosis not present

## 2012-03-13 ENCOUNTER — Ambulatory Visit: Payer: Medicare Other | Admitting: Physical Therapy

## 2012-03-16 DIAGNOSIS — J309 Allergic rhinitis, unspecified: Secondary | ICD-10-CM | POA: Diagnosis not present

## 2012-03-26 DIAGNOSIS — Z1212 Encounter for screening for malignant neoplasm of rectum: Secondary | ICD-10-CM | POA: Diagnosis not present

## 2012-03-26 DIAGNOSIS — Z1289 Encounter for screening for malignant neoplasm of other sites: Secondary | ICD-10-CM | POA: Diagnosis not present

## 2012-03-27 DIAGNOSIS — J309 Allergic rhinitis, unspecified: Secondary | ICD-10-CM | POA: Diagnosis not present

## 2012-04-11 DIAGNOSIS — J309 Allergic rhinitis, unspecified: Secondary | ICD-10-CM | POA: Diagnosis not present

## 2012-04-17 DIAGNOSIS — H5500 Unspecified nystagmus: Secondary | ICD-10-CM | POA: Diagnosis not present

## 2012-04-19 DIAGNOSIS — H5500 Unspecified nystagmus: Secondary | ICD-10-CM | POA: Diagnosis not present

## 2012-04-24 DIAGNOSIS — J309 Allergic rhinitis, unspecified: Secondary | ICD-10-CM | POA: Diagnosis not present

## 2012-05-07 DIAGNOSIS — J309 Allergic rhinitis, unspecified: Secondary | ICD-10-CM | POA: Diagnosis not present

## 2012-05-08 DIAGNOSIS — Z85828 Personal history of other malignant neoplasm of skin: Secondary | ICD-10-CM | POA: Diagnosis not present

## 2012-05-14 ENCOUNTER — Other Ambulatory Visit: Payer: Self-pay | Admitting: Allergy and Immunology

## 2012-05-14 ENCOUNTER — Ambulatory Visit
Admission: RE | Admit: 2012-05-14 | Discharge: 2012-05-14 | Disposition: A | Discharge status: Home / Self Care | Payer: Medicare Other | Source: Ambulatory Visit | Attending: Allergy and Immunology | Admitting: Allergy and Immunology

## 2012-05-14 DIAGNOSIS — J3089 Other allergic rhinitis: Secondary | ICD-10-CM | POA: Diagnosis not present

## 2012-05-14 DIAGNOSIS — J209 Acute bronchitis, unspecified: Secondary | ICD-10-CM

## 2012-05-14 DIAGNOSIS — J984 Other disorders of lung: Secondary | ICD-10-CM | POA: Diagnosis not present

## 2012-05-14 DIAGNOSIS — J3081 Allergic rhinitis due to animal (cat) (dog) hair and dander: Secondary | ICD-10-CM | POA: Diagnosis not present

## 2012-05-28 DIAGNOSIS — J309 Allergic rhinitis, unspecified: Secondary | ICD-10-CM | POA: Diagnosis not present

## 2012-06-05 DIAGNOSIS — J309 Allergic rhinitis, unspecified: Secondary | ICD-10-CM | POA: Diagnosis not present

## 2012-06-12 DIAGNOSIS — J309 Allergic rhinitis, unspecified: Secondary | ICD-10-CM | POA: Diagnosis not present

## 2012-06-15 DIAGNOSIS — H5502 Latent nystagmus: Secondary | ICD-10-CM | POA: Diagnosis not present

## 2012-06-15 DIAGNOSIS — R269 Unspecified abnormalities of gait and mobility: Secondary | ICD-10-CM | POA: Diagnosis not present

## 2012-06-19 DIAGNOSIS — J309 Allergic rhinitis, unspecified: Secondary | ICD-10-CM | POA: Diagnosis not present

## 2012-06-27 DIAGNOSIS — J309 Allergic rhinitis, unspecified: Secondary | ICD-10-CM | POA: Diagnosis not present

## 2012-07-03 DIAGNOSIS — J309 Allergic rhinitis, unspecified: Secondary | ICD-10-CM | POA: Diagnosis not present

## 2012-07-04 ENCOUNTER — Encounter (HOSPITAL_COMMUNITY): Payer: Self-pay | Admitting: Emergency Medicine

## 2012-07-04 ENCOUNTER — Emergency Department (HOSPITAL_COMMUNITY)
Admission: EM | Admit: 2012-07-04 | Discharge: 2012-07-04 | Disposition: A | Discharge status: Home / Self Care | Payer: No Typology Code available for payment source | Attending: Emergency Medicine | Admitting: Emergency Medicine

## 2012-07-04 ENCOUNTER — Emergency Department (HOSPITAL_COMMUNITY): Payer: No Typology Code available for payment source

## 2012-07-04 DIAGNOSIS — Y998 Other external cause status: Secondary | ICD-10-CM | POA: Insufficient documentation

## 2012-07-04 DIAGNOSIS — M949 Disorder of cartilage, unspecified: Secondary | ICD-10-CM | POA: Insufficient documentation

## 2012-07-04 DIAGNOSIS — S0083XA Contusion of other part of head, initial encounter: Secondary | ICD-10-CM | POA: Insufficient documentation

## 2012-07-04 DIAGNOSIS — S0003XA Contusion of scalp, initial encounter: Secondary | ICD-10-CM | POA: Insufficient documentation

## 2012-07-04 DIAGNOSIS — D509 Iron deficiency anemia, unspecified: Secondary | ICD-10-CM | POA: Insufficient documentation

## 2012-07-04 DIAGNOSIS — S1093XA Contusion of unspecified part of neck, initial encounter: Secondary | ICD-10-CM | POA: Diagnosis not present

## 2012-07-04 DIAGNOSIS — K219 Gastro-esophageal reflux disease without esophagitis: Secondary | ICD-10-CM | POA: Insufficient documentation

## 2012-07-04 DIAGNOSIS — T1490XA Injury, unspecified, initial encounter: Secondary | ICD-10-CM | POA: Diagnosis not present

## 2012-07-04 DIAGNOSIS — Y93I9 Activity, other involving external motion: Secondary | ICD-10-CM | POA: Insufficient documentation

## 2012-07-04 DIAGNOSIS — M899 Disorder of bone, unspecified: Secondary | ICD-10-CM | POA: Insufficient documentation

## 2012-07-04 DIAGNOSIS — Z043 Encounter for examination and observation following other accident: Secondary | ICD-10-CM | POA: Diagnosis not present

## 2012-07-04 DIAGNOSIS — S064X9A Epidural hemorrhage with loss of consciousness of unspecified duration, initial encounter: Secondary | ICD-10-CM | POA: Diagnosis not present
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
## 2012-07-12 DIAGNOSIS — R269 Unspecified abnormalities of gait and mobility: Secondary | ICD-10-CM | POA: Diagnosis not present

## 2012-07-12 DIAGNOSIS — H5502 Latent nystagmus: Secondary | ICD-10-CM | POA: Diagnosis not present

## 2012-07-13 ENCOUNTER — Telehealth: Payer: Self-pay | Admitting: Internal Medicine

## 2012-07-13 DIAGNOSIS — H2510 Age-related nuclear cataract, unspecified eye: Secondary | ICD-10-CM | POA: Diagnosis not present

## 2012-07-13 DIAGNOSIS — J309 Allergic rhinitis, unspecified: Secondary | ICD-10-CM | POA: Diagnosis not present

## 2012-07-13 NOTE — Telephone Encounter (Signed)
Forward 4 pages from [facility] ENT to Dr. Illene Regulus for review on 07-13-12 ym

## 2012-07-30 DIAGNOSIS — J309 Allergic rhinitis, unspecified: Secondary | ICD-10-CM | POA: Diagnosis not present

## 2012-08-10 ENCOUNTER — Other Ambulatory Visit: Payer: Self-pay | Admitting: Internal Medicine

## 2012-08-10 DIAGNOSIS — J309 Allergic rhinitis, unspecified: Secondary | ICD-10-CM | POA: Diagnosis not present

## 2012-08-10 MED ORDER — OMEPRAZOLE 40 MG PO CPDR: 40.0000 mg | DELAYED_RELEASE_CAPSULE | Freq: Every day | ORAL | Status: DC

## 2012-08-10 NOTE — Telephone Encounter (Signed)
Pt aware med sent to pharmacy

## 2012-08-13 DIAGNOSIS — M542 Cervicalgia: Secondary | ICD-10-CM | POA: Diagnosis not present

## 2012-08-17 DIAGNOSIS — J309 Allergic rhinitis, unspecified: Secondary | ICD-10-CM | POA: Diagnosis not present

## 2012-08-24 DIAGNOSIS — J309 Allergic rhinitis, unspecified: Secondary | ICD-10-CM | POA: Diagnosis not present

## 2012-08-28 DIAGNOSIS — J309 Allergic rhinitis, unspecified: Secondary | ICD-10-CM | POA: Diagnosis not present

## 2012-09-07 DIAGNOSIS — J309 Allergic rhinitis, unspecified: Secondary | ICD-10-CM | POA: Diagnosis not present

## 2012-09-10 DIAGNOSIS — J309 Allergic rhinitis, unspecified: Secondary | ICD-10-CM | POA: Diagnosis not present

## 2012-09-13 DIAGNOSIS — J309 Allergic rhinitis, unspecified: Secondary | ICD-10-CM | POA: Diagnosis not present

## 2012-09-18 ENCOUNTER — Ambulatory Visit (INDEPENDENT_AMBULATORY_CARE_PROVIDER_SITE_OTHER): Payer: Medicare Other

## 2012-09-18 ENCOUNTER — Telehealth: Payer: Self-pay | Admitting: Internal Medicine

## 2012-09-18 DIAGNOSIS — M899 Disorder of bone, unspecified: Secondary | ICD-10-CM

## 2012-09-18 DIAGNOSIS — Z23 Encounter for immunization: Secondary | ICD-10-CM | POA: Diagnosis not present

## 2012-09-18 DIAGNOSIS — M949 Disorder of cartilage, unspecified: Secondary | ICD-10-CM

## 2012-09-18 NOTE — Telephone Encounter (Signed)
Order entered

## 2012-09-18 NOTE — Telephone Encounter (Signed)
Pt is due for Bone Density in Nov 2013, request order so she may go ahead and sch appt

## 2012-09-23 ENCOUNTER — Encounter (HOSPITAL_COMMUNITY): Payer: Self-pay | Admitting: *Deleted

## 2012-09-23 ENCOUNTER — Emergency Department (INDEPENDENT_AMBULATORY_CARE_PROVIDER_SITE_OTHER)
Admission: EM | Admit: 2012-09-23 | Discharge: 2012-09-23 | Disposition: A | Discharge status: Home / Self Care | Payer: Medicare Other | Source: Home / Self Care | Attending: Emergency Medicine | Admitting: Emergency Medicine

## 2012-09-23 DIAGNOSIS — N390 Urinary tract infection, site not specified: Secondary | ICD-10-CM | POA: Diagnosis not present

## 2012-09-23 MED ORDER — CEPHALEXIN 500 MG PO CAPS: 500.0000 mg | ORAL_CAPSULE | Freq: Three times a day (TID) | ORAL | Status: AC

## 2012-09-23 NOTE — ED Notes (Signed)
Pt  Reports  Symptoms   Of   frequeny   Of  Urination      With  Hematuria          Symptoms  Started  yest      -  Has  Had  uti  In  Past

## 2012-09-23 NOTE — ED Provider Notes (Signed)
History     CSN: [id number]  Arrival date & time 09/23/12  0904   First MD Initiated Contact with Patient 09/23/12 0920      Chief Complaint  Patient presents with  . Urinary Frequency    (Consider location/radiation/quality/duration/timing/severity/associated sxs/prior treatment) HPI Comments: Patient presents urgent care complaining of increased frequency with urination, pressure and discomfort with observable blood when she uses toilet paper to clean. Symptoms started yesterday in the afternoon, she describes it Friday she did have some type of " viral infection as she felt somewhat tired with no energy and had a couple of episodes of diarrheas", those symptoms subsided and by Saturday morning she was doing perfectly fine. She called her on call service and discuss her symptoms with the on-call nurse was advised her to come here for further evaluation. Patient is afebrile coma no nausea vomiting, no flank pain or abdominal pain  Patient is a 71 y.o. female presenting with frequency.  Urinary Frequency This is a new problem. The current episode started yesterday. The problem occurs constantly. The problem has not changed since onset.Pertinent negatives include no abdominal pain. Exacerbated by: Urinating. Nothing relieves the symptoms. She has tried nothing for the symptoms.    Past Medical History  Diagnosis Date  . Ataxia   . Emphysema   . Diverticulosis   . Renal cyst   . Osteopenia   . GERD (gastroesophageal reflux disease)   . Asthma   . Iron deficiency anemia, unspecified     Past Surgical History  Procedure Date  . Tonsillectomy   . Breast biopsy   . Cervical spine surgery 2010    Family History  Problem Relation Age of Onset  . Colon cancer Father   . Coronary artery disease Father   . Pancreatic cancer Mother   . Colon cancer Paternal Grandmother   . Colon cancer Paternal Aunt   . Breast cancer Paternal Aunt   . Ovarian cancer Maternal Aunt     History    Substance Use Topics  . Smoking status: Never Smoker   . Smokeless tobacco: Never Used  . Alcohol Use: No    OB History    Grav Para Term Preterm Abortions TAB SAB Ect Mult Living                  Review of Systems  Constitutional: Positive for activity change. Negative for fever, chills and fatigue.  Gastrointestinal: Negative for abdominal pain, anal bleeding and rectal pain.  Genitourinary: Positive for dysuria, urgency, frequency and hematuria. Negative for flank pain, decreased urine volume, vaginal discharge, vaginal pain and pelvic pain.  Musculoskeletal: Negative for back pain.    Allergies  Iohexol  Home Medications   Current Outpatient Rx  Name  Route  Sig  Dispense  Refill  . ASPIRIN 81 MG PO TABS   Oral   Take 81 mg by mouth daily.           . B COMPLEX PO TABS   Oral   Take 1 tablet by mouth daily.         Marland Kitchen CALCIUM CARBONATE-VITAMIN D 600-400 MG-UNIT PO TABS   Oral   Take 1 tablet by mouth daily.          . CEPHALEXIN 500 MG PO CAPS   Oral   Take 1 capsule (500 mg total) by mouth 3 (three) times daily.   20 capsule   0   . VITAMIN D 1000 UNITS PO TABS  Oral   Take 1,000 Units by mouth daily.         Marland Kitchen GINKGO BILOBA 100 MG PO CAPS   Oral   Take 2 capsules by mouth daily.         . CENTRUM SILVER PO   Oral   Take 1 capsule by mouth daily.           Marland Kitchen OMEPRAZOLE 40 MG PO CPDR   Oral   Take 1 capsule (40 mg total) by mouth daily.   90 capsule   3   . VITAMIN E 600 UNITS PO CAPS   Oral   Take 600 Units by mouth daily.             BP 141/75  Pulse 80  Temp 98.5 F (36.9 C) (Oral)  Resp 16  SpO2 100%  Physical Exam  Nursing note and vitals reviewed. Constitutional: Vital signs are normal. She appears well-developed and well-nourished.  Non-toxic appearance. She does not have a sickly appearance. She does not appear ill.  Abdominal: Soft. She exhibits no distension and no mass. There is no tenderness. There is no  rebound, no guarding and no CVA tenderness.    ED Course  Procedures (including critical care time)   Labs Reviewed  URINE CULTURE   No results found.   1. Urinary tract infection    Urine dip test results did not crossover electronically the patient had large right blood cells in urine and moderate leukocyturia.( Nitrate negative)   MDM  Uncomplicated urinary tract infection. Patient has been started on Keflex 500 mg 3 times a day for 7 days. Urine culture were sent today. Otherwise patient to expect remarkable improvement in next 48 hours. And discussed symptoms that should warrant her return for further evaluation. Patient agrees with treatment plan and follow-up care as necessary. We will call if abnormal culture results require further treatment or antibiotic change. Patient looks comfortable afebrile.        Jimmie Molly, MD 09/23/12 1007

## 2012-09-24 ENCOUNTER — Telehealth: Payer: Self-pay

## 2012-09-24 LAB — POCT URINALYSIS DIP (DEVICE)
Bilirubin Urine: NEGATIVE
Glucose, UA: NEGATIVE mg/dL
Ketones, ur: NEGATIVE mg/dL
Nitrite: NEGATIVE
Protein, ur: 30 mg/dL — AB
Specific Gravity, Urine: >=1.03 (ref 1.005–1.030)
Urobilinogen, UA: 0.2 mg/dL (ref 0.0–1.0)
pH: 5.5 (ref 5.0–8.0)

## 2012-09-24 LAB — URINE CULTURE: Colony Count: 7000

## 2012-09-24 NOTE — Telephone Encounter (Signed)
[facility] Triage Call Report Triage Record Num: [id number] Operator: April Finney Patient Name: Arilla Hice Call Date & Time: 09/22/2012 4:52:33PM Patient Phone: [phone] PCP: Illene Regulus Patient Gender: Female PCP Fax : [phone] Patient DOB: 05/16/1941 Practice Name: [facility] Reason for Call: Caller: Raechel/Patient; PCP: Illene Regulus (Adults only); CB#: [phone]; Call regarding Urinary Pain/Bleeding; Wants to know if she can get antibiotic called in. Instructed her she would need to be seen first. No standing orders for UTI symptoms. Declinies triage and will go to [facility]. Protocol(s) Used: Office Note Recommended Outcome per Protocol: Information Noted and Sent to Office Reason for Outcome: Caller information to office Care Advice: ~ 09/22/2012 5:00:44PM Page 1 of 1 [id number]

## 2012-09-24 NOTE — ED Notes (Signed)
Final report UA culture pending 

## 2012-10-01 DIAGNOSIS — J309 Allergic rhinitis, unspecified: Secondary | ICD-10-CM | POA: Diagnosis not present

## 2012-10-08 DIAGNOSIS — J309 Allergic rhinitis, unspecified: Secondary | ICD-10-CM | POA: Diagnosis not present

## 2012-10-11 DIAGNOSIS — N390 Urinary tract infection, site not specified: Secondary | ICD-10-CM | POA: Diagnosis not present

## 2012-10-11 DIAGNOSIS — N3020 Other chronic cystitis without hematuria: Secondary | ICD-10-CM | POA: Diagnosis not present

## 2012-10-16 DIAGNOSIS — J309 Allergic rhinitis, unspecified: Secondary | ICD-10-CM | POA: Diagnosis not present

## 2012-10-22 DIAGNOSIS — J3081 Allergic rhinitis due to animal (cat) (dog) hair and dander: Secondary | ICD-10-CM | POA: Diagnosis not present

## 2012-10-22 DIAGNOSIS — J3089 Other allergic rhinitis: Secondary | ICD-10-CM | POA: Diagnosis not present

## 2012-10-22 DIAGNOSIS — J309 Allergic rhinitis, unspecified: Secondary | ICD-10-CM | POA: Diagnosis not present

## 2012-10-26 DIAGNOSIS — J309 Allergic rhinitis, unspecified: Secondary | ICD-10-CM | POA: Diagnosis not present

## 2012-10-27 ENCOUNTER — Encounter: Payer: Self-pay | Admitting: Family

## 2012-10-27 ENCOUNTER — Ambulatory Visit (INDEPENDENT_AMBULATORY_CARE_PROVIDER_SITE_OTHER): Payer: Medicare Other | Admitting: Family

## 2012-10-27 VITALS — BP 130/70 | HR 81 | Temp 97.1°F | Resp 16 | Wt 89.2 lb

## 2012-10-27 DIAGNOSIS — R350 Frequency of micturition: Secondary | ICD-10-CM | POA: Diagnosis not present

## 2012-10-27 DIAGNOSIS — R300 Dysuria: Secondary | ICD-10-CM | POA: Diagnosis not present

## 2012-10-27 DIAGNOSIS — N390 Urinary tract infection, site not specified: Secondary | ICD-10-CM | POA: Diagnosis not present

## 2012-10-27 LAB — POCT URINALYSIS DIPSTICK
Bilirubin, UA: NEGATIVE
Glucose, UA: NEGATIVE
Ketones, UA: NEGATIVE
Nitrite, UA: NEGATIVE
Protein, UA: NEGATIVE
Spec Grav, UA: <=1.005
Urobilinogen, UA: 0.2
pH, UA: 7.5

## 2012-10-27 MED ORDER — SULFAMETHOXAZOLE-TRIMETHOPRIM 800-160 MG PO TABS: 1.0000 | ORAL_TABLET | Freq: Two times a day (BID) | ORAL | Status: DC

## 2012-10-27 NOTE — Patient Instructions (Signed)
Urinary Tract Infection Urinary tract infections (UTIs) can develop anywhere along your urinary tract. Your urinary tract is your body's drainage system for removing wastes and extra water. Your urinary tract includes two kidneys, two ureters, a bladder, and a urethra. Your kidneys are a pair of bean-shaped organs. Each kidney is about the size of your fist. They are located below your ribs, one on each side of your spine. CAUSES Infections are caused by microbes, which are microscopic organisms, including fungi, viruses, and bacteria. These organisms are so small that they can only be seen through a microscope. Bacteria are the microbes that most commonly cause UTIs. SYMPTOMS  Symptoms of UTIs may vary by age and gender of the patient and by the location of the infection. Symptoms in young women typically include a frequent and intense urge to urinate and a painful, burning feeling in the bladder or urethra during urination. Older women and men are more likely to be tired, shaky, and weak and have muscle aches and abdominal pain. A fever may mean the infection is in your kidneys. Other symptoms of a kidney infection include pain in your back or sides below the ribs, nausea, and vomiting. DIAGNOSIS To diagnose a UTI, your caregiver will ask you about your symptoms. Your caregiver also will ask to provide a urine sample. The urine sample will be tested for bacteria and white blood cells. White blood cells are made by your body to help fight infection. TREATMENT  Typically, UTIs can be treated with medication. Because most UTIs are caused by a bacterial infection, they usually can be treated with the use of antibiotics. The choice of antibiotic and length of treatment depend on your symptoms and the type of bacteria causing your infection. HOME CARE INSTRUCTIONS  If you were prescribed antibiotics, take them exactly as your caregiver instructs you. Finish the medication even if you feel better after you  have only taken some of the medication.  Drink enough water and fluids to keep your urine clear or pale yellow.  Avoid caffeine, tea, and carbonated beverages. They tend to irritate your bladder.  Empty your bladder often. Avoid holding urine for long periods of time.  Empty your bladder before and after sexual intercourse.  After a bowel movement, women should cleanse from front to back. Use each tissue only once. SEEK MEDICAL CARE IF:   You have back pain.  You develop a fever.  Your symptoms do not begin to resolve within 3 days. SEEK IMMEDIATE MEDICAL CARE IF:   You have severe back pain or lower abdominal pain.  You develop chills.  You have nausea or vomiting.  You have continued burning or discomfort with urination. MAKE SURE YOU:   Understand these instructions.  Will watch your condition.  Will get help right away if you are not doing well or get worse. Document Released: 08/17/2005 Document Revised: 05/08/2012 Document Reviewed: 12/16/2011 [employer] Patient Information 2013 [employer].  

## 2012-10-27 NOTE — Progress Notes (Signed)
Subjective:    Patient ID: Kristine Francis, female    DOB: December 29, 1940, 71 y.o.   MRN: [id number]  HPI 71 year old white female, nonsmoker, patient of Dr. Debby Bud is in today with c/o burning with urination when she woke up this morning. She has also noticed urinary frequency and urgency. She uses poise pads daily. She consumed a moderate amount of caffeine a day. Has had 2 UTIs prior to today. Most recently in November and treated with Cephalexin.    Review of Systems  Constitutional: Negative.  Negative for fever.  Respiratory: Negative.   Cardiovascular: Negative.   Gastrointestinal: Negative.   Genitourinary: Positive for dysuria, urgency and frequency. Negative for vaginal discharge and vaginal pain.  Musculoskeletal: Negative.  Negative for back pain.  Hematological: Negative.   Psychiatric/Behavioral: Negative.    Past Medical History  Diagnosis Date  . Ataxia   . Emphysema   . Diverticulosis   . Renal cyst   . Osteopenia   . GERD (gastroesophageal reflux disease)   . Asthma   . Iron deficiency anemia, unspecified     History   Social History  . Marital Status: Married    Spouse Name: N/A    Number of Children: N/A  . Years of Education: N/A   Occupational History  . Retired    Social History Main Topics  . Smoking status: Never Smoker   . Smokeless tobacco: Never Used  . Alcohol Use: No  . Drug Use: No  . Sexually Active: Not on file   Other Topics Concern  . Not on file   Social History Narrative   Caffeine daily The patient is here for annual Medicare wellness examination and management of other chronic and acute problems. The risk factors are reflected in the social history.The roster of all physicians providing medical care to patient - is listed in the Snapshot section of the chart.Activities of daily living:  The patient is 100% inedpendent in all ADLs: dressing, toileting, feeding as well as independent mobilityHome safety : The patient has smoke  detectors in the home. They wear seatbelts.No firearms at home ( firearms are present in the home, kept in a safe fashion). There is no violence in the home. There is no risks for hepatitis, STDs or HIV. There is no   history of blood transfusion. They have no travel history to infectious disease endemic areas of the world.The patient has (has not) seen their dentist in the last six month. They have (not) seen their eye doctor in the last year. They deny (admit to) any hearing difficulty and have not had audiologic testing in the last year.  They do not  have excessive sun exposure. Discussed the need for sun protection: hats, long sleeves and use of sunscreen if there is significant sun exposure. Diet: the importance of a healthy diet is discussed. They do have a healthy (unhealthy-high fat/fast food) diet.The patient has a regular exercise program: _______ , ____duration, _____per week.  The benefits of regular aerobic exercise were discussed.Depression screen: there are no signs or vegative symptoms of depression- irritability, change in appetite, anhedonia, sadness/tearfullness.Cognitive assessment: the patient manages all their financial and personal affairs and is actively engaged. They could relate day,date,year and events; recalled 3/3 objects at 3 minutes; performed clock-face test normally.The following portions of the patient's history were reviewed and updated as appropriate: allergies, current medications, past family history, past medical history,  past surgical history, past social history  and problem list.Vision, hearing, body  mass index were assessed and reviewed. During the course of the visit the patient was educated and counseled about appropriate screening and preventive services including : fall prevention , diabetes screening, nutrition counseling, colorectal cancer screening, and recommended immunizations.    Past Surgical History  Procedure Date  . Tonsillectomy   . Breast biopsy   .  Cervical spine surgery 2010    Family History  Problem Relation Age of Onset  . Colon cancer Father   . Coronary artery disease Father   . Pancreatic cancer Mother   . Colon cancer Paternal Grandmother   . Colon cancer Paternal Aunt   . Breast cancer Paternal Aunt   . Ovarian cancer Maternal Aunt     Allergies  Allergen Reactions  . Iohexol Other (See Comments)    Gi upset    Current Outpatient Prescriptions on File Prior to Visit  Medication Sig Dispense Refill  . aspirin 81 MG tablet Take 81 mg by mouth daily.        Marland Kitchen b complex vitamins tablet Take 1 tablet by mouth daily.      . Calcium Carbonate-Vitamin D (SM CALCIUM-VITAMIN D) 600-400 MG-UNIT per tablet Take 1 tablet by mouth daily.       . cholecalciferol (VITAMIN D) 1000 UNITS tablet Take 1,000 Units by mouth daily.      . Ginkgo Biloba 100 MG CAPS Take 2 capsules by mouth daily.      . Multiple Vitamins-Minerals (CENTRUM SILVER PO) Take 1 capsule by mouth daily.        Marland Kitchen omeprazole (PRILOSEC) 40 MG capsule Take 1 capsule (40 mg total) by mouth daily.  90 capsule  3  . vitamin E 600 UNIT capsule Take 600 Units by mouth daily.          BP 130/70  Pulse 81  Temp 97.1 F (36.2 C) (Oral)  Resp 16  Wt 89 lb 4 oz (40.484 kg)chart    Objective:   Physical Exam  Constitutional: She is oriented to person, place, and time. She appears well-developed and well-nourished.  Cardiovascular: Normal rate, regular rhythm and normal heart sounds.   Pulmonary/Chest: Effort normal and breath sounds normal.  Abdominal: Soft. Bowel sounds are normal. There is no tenderness. There is no rebound and no guarding.  Neurological: She is alert and oriented to person, place, and time.  Skin: Skin is warm and dry.  Psychiatric: She has a normal mood and affect.          Assessment & Plan:  Assessment: UTI, urinary frequency, Dysuria  Plan: Bactrim DS 1 tab twice a day x 7 days. Call the office if symptoms worsen or persist. Recheck  as scheduled and sooner as needed. Consult urology.

## 2012-10-30 DIAGNOSIS — J309 Allergic rhinitis, unspecified: Secondary | ICD-10-CM | POA: Diagnosis not present

## 2012-11-02 DIAGNOSIS — N281 Cyst of kidney, acquired: Secondary | ICD-10-CM | POA: Diagnosis not present

## 2012-11-02 DIAGNOSIS — R310 Gross hematuria: Secondary | ICD-10-CM | POA: Diagnosis not present

## 2012-11-02 DIAGNOSIS — J309 Allergic rhinitis, unspecified: Secondary | ICD-10-CM | POA: Diagnosis not present

## 2012-11-02 DIAGNOSIS — R300 Dysuria: Secondary | ICD-10-CM | POA: Diagnosis not present

## 2012-11-06 DIAGNOSIS — J309 Allergic rhinitis, unspecified: Secondary | ICD-10-CM | POA: Diagnosis not present

## 2012-11-15 DIAGNOSIS — J309 Allergic rhinitis, unspecified: Secondary | ICD-10-CM | POA: Diagnosis not present

## 2012-11-16 DIAGNOSIS — R310 Gross hematuria: Secondary | ICD-10-CM | POA: Diagnosis not present

## 2012-11-16 DIAGNOSIS — N281 Cyst of kidney, acquired: Secondary | ICD-10-CM | POA: Diagnosis not present

## 2012-11-16 HISTORY — DX: Cyst of kidney, acquired: N28.1

## 2012-11-22 DIAGNOSIS — J309 Allergic rhinitis, unspecified: Secondary | ICD-10-CM | POA: Diagnosis not present

## 2012-11-23 DIAGNOSIS — R300 Dysuria: Secondary | ICD-10-CM | POA: Diagnosis not present

## 2012-11-23 DIAGNOSIS — N281 Cyst of kidney, acquired: Secondary | ICD-10-CM | POA: Diagnosis not present

## 2012-11-23 DIAGNOSIS — R310 Gross hematuria: Secondary | ICD-10-CM | POA: Diagnosis not present

## 2012-11-30 DIAGNOSIS — J309 Allergic rhinitis, unspecified: Secondary | ICD-10-CM | POA: Diagnosis not present

## 2012-12-04 DIAGNOSIS — J309 Allergic rhinitis, unspecified: Secondary | ICD-10-CM | POA: Diagnosis not present

## 2012-12-14 DIAGNOSIS — J309 Allergic rhinitis, unspecified: Secondary | ICD-10-CM | POA: Diagnosis not present

## 2012-12-17 DIAGNOSIS — J309 Allergic rhinitis, unspecified: Secondary | ICD-10-CM | POA: Diagnosis not present

## 2012-12-24 ENCOUNTER — Encounter: Payer: Self-pay | Admitting: Internal Medicine

## 2012-12-24 ENCOUNTER — Ambulatory Visit (INDEPENDENT_AMBULATORY_CARE_PROVIDER_SITE_OTHER): Payer: Medicare Other | Admitting: Internal Medicine

## 2012-12-24 ENCOUNTER — Other Ambulatory Visit (INDEPENDENT_AMBULATORY_CARE_PROVIDER_SITE_OTHER): Payer: Medicare Other

## 2012-12-24 VITALS — BP 130/80 | HR 71 | Temp 97.9°F | Resp 10 | Ht 60.25 in | Wt 92.0 lb

## 2012-12-24 DIAGNOSIS — Z0000 Encounter for general adult medical examination without abnormal findings: Secondary | ICD-10-CM

## 2012-12-24 DIAGNOSIS — M949 Disorder of cartilage, unspecified: Secondary | ICD-10-CM | POA: Diagnosis not present

## 2012-12-24 DIAGNOSIS — M25559 Pain in unspecified hip: Secondary | ICD-10-CM

## 2012-12-24 DIAGNOSIS — Z23 Encounter for immunization: Secondary | ICD-10-CM

## 2012-12-24 DIAGNOSIS — R636 Underweight: Secondary | ICD-10-CM

## 2012-12-24 DIAGNOSIS — J45909 Unspecified asthma, uncomplicated: Secondary | ICD-10-CM

## 2012-12-24 DIAGNOSIS — T887XXA Unspecified adverse effect of drug or medicament, initial encounter: Secondary | ICD-10-CM | POA: Diagnosis not present

## 2012-12-24 DIAGNOSIS — R279 Unspecified lack of coordination: Secondary | ICD-10-CM

## 2012-12-24 DIAGNOSIS — M899 Disorder of bone, unspecified: Secondary | ICD-10-CM

## 2012-12-24 DIAGNOSIS — D509 Iron deficiency anemia, unspecified: Secondary | ICD-10-CM

## 2012-12-24 DIAGNOSIS — J438 Other emphysema: Secondary | ICD-10-CM

## 2012-12-24 DIAGNOSIS — N281 Cyst of kidney, acquired: Secondary | ICD-10-CM

## 2012-12-24 LAB — COMPREHENSIVE METABOLIC PANEL
ALT: 20 U/L (ref 0–35)
AST: 28 U/L (ref 0–37)
Albumin: 4.3 g/dL (ref 3.5–5.2)
Alkaline Phosphatase: 58 U/L (ref 39–117)
BUN: 16 mg/dL (ref 6–23)
CO2: 32 mEq/L (ref 19–32)
Calcium: 10.3 mg/dL (ref 8.4–10.5)
Chloride: 103 mEq/L (ref 96–112)
Creatinine, Ser: 0.6 mg/dL (ref 0.4–1.2)
GFR: 100.96 mL/min (ref >60.00)
Glucose, Bld: 102 mg/dL — ABNORMAL HIGH (ref 70–99)
Potassium: 4.2 mEq/L (ref 3.5–5.1)
Sodium: 142 mEq/L (ref 135–145)
Total Bilirubin: 0.8 mg/dL (ref 0.3–1.2)
Total Protein: 7.2 g/dL (ref 6.0–8.3)
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
## 2012-12-24 NOTE — Patient Instructions (Addendum)
Thanks for coming in.  You will have a single lab study today and a Tetanus shot.  Please sign up for MyChart  See you in a year, sooner as needed.

## 2012-12-25 ENCOUNTER — Encounter: Payer: Self-pay | Admitting: Internal Medicine

## 2012-12-25 DIAGNOSIS — J309 Allergic rhinitis, unspecified: Secondary | ICD-10-CM | POA: Diagnosis not present

## 2012-12-25 NOTE — Assessment & Plan Note (Signed)
Lab Results  Component Value Date   HGB 13.0 12/12/2011   Not an active problem

## 2012-12-25 NOTE — Assessment & Plan Note (Signed)
No change in condition. She does follow with Dr. Mickie Bail with visit coming up in April '14. She has had a thorough evaluation for nystagmus by both neurology and ENT. She may benefit from a refresh course of therapy and Neuro-rehab.

## 2012-12-25 NOTE — Assessment & Plan Note (Signed)
Stable with no report of wheezing or respiratory difficulty

## 2012-12-25 NOTE — Assessment & Plan Note (Signed)
Renal cysts noted on MRI in '02. Question of some progression in size. Not an acute problem  Plan Per Dr. Margarita Grizzle - patient for a follow up MRI renal in several months.

## 2012-12-25 NOTE — Assessment & Plan Note (Signed)
A radiographic diagnosis with no over symptoms and prior pulmonary function testing.  CXR Nov 19, '09: IMPRESSION:  1. Severe emphysema without acute cardiopulmonary disease.  2. Old granulomatous disease.  Plan - for any respiratory symptoms, i.e. Shortness of breath, dyspnea on exertion or decreased exercise tolerance will order PFTs

## 2012-12-25 NOTE — Assessment & Plan Note (Signed)
Per report from patient record review DEXA revealed osteopenia in the past. She has had several years of Fosamax treatment but stopped in recent months due to leg cramps.   Plan F/u DEXA.   Latest guidelines do not recommended medical therapy for osteopenia, rather calcium, vit d and weight bearing exercise.

## 2012-12-25 NOTE — Assessment & Plan Note (Addendum)
Interval history is unremarkable for any major medical illness, surgery or injury. She is feeling well. Physical exam sans breast and pelvic is OK . Previous and current lab reviewed - no problems revealed. She is current with immunizations including shingles vaccine. She is current for colorectal and breast cancer screening.  In summary - a very nice woman who is medically stable. Her weight has been stable for years and does not represent a problem. She is encouraged to: increase her exercise quotient; consider a brush up round of neuro-rehab; have her DEXA scan. She will return in 1 year, sooner if needed.

## 2013-01-07 DIAGNOSIS — J309 Allergic rhinitis, unspecified: Secondary | ICD-10-CM | POA: Diagnosis not present

## 2013-01-07 DIAGNOSIS — S0500XA Injury of conjunctiva and corneal abrasion without foreign body, unspecified eye, initial encounter: Secondary | ICD-10-CM | POA: Diagnosis not present

## 2013-01-18 DIAGNOSIS — J309 Allergic rhinitis, unspecified: Secondary | ICD-10-CM | POA: Diagnosis not present

## 2013-01-19 ENCOUNTER — Encounter: Payer: Self-pay | Admitting: Family Medicine

## 2013-01-19 ENCOUNTER — Ambulatory Visit (INDEPENDENT_AMBULATORY_CARE_PROVIDER_SITE_OTHER): Payer: Medicare Other | Admitting: Family Medicine

## 2013-01-19 ENCOUNTER — Ambulatory Visit: Payer: Medicare Other | Admitting: Family Medicine

## 2013-01-19 VITALS — BP 130/90 | HR 68 | Temp 98.1°F | Resp 20 | Wt 90.1 lb

## 2013-01-19 DIAGNOSIS — J209 Acute bronchitis, unspecified: Secondary | ICD-10-CM | POA: Diagnosis not present

## 2013-01-19 MED ORDER — AZITHROMYCIN 250 MG PO TABS: ORAL_TABLET | ORAL | Status: DC

## 2013-01-19 NOTE — Progress Notes (Signed)
  Subjective:    Patient ID: Kristine Francis, female    DOB: 1941/02/07, 72 y.o.   MRN: [id number]  HPI Here for one week of chest tightness and coughing up green sputum. No chest pain or SOB. No fever.    Review of Systems  Constitutional: Negative.   HENT: Negative.   Eyes: Negative.   Respiratory: Positive for cough and chest tightness.        Objective:   Physical Exam  Constitutional: She appears well-developed and well-nourished. No distress.  HENT:  Right Ear: External ear normal.  Left Ear: External ear normal.  Nose: Nose normal.  Mouth/Throat: Oropharynx is clear and moist.  Eyes: Conjunctivae are normal.  Pulmonary/Chest: Effort normal. No respiratory distress. She has no wheezes. She has no rales. She exhibits no tenderness.  Scattered rhonchi   Lymphadenopathy:    She has no cervical adenopathy.          Assessment & Plan:  Add Mucinex.

## 2013-01-28 DIAGNOSIS — J309 Allergic rhinitis, unspecified: Secondary | ICD-10-CM | POA: Diagnosis not present

## 2013-02-01 DIAGNOSIS — H5200 Hypermetropia, unspecified eye: Secondary | ICD-10-CM | POA: Diagnosis not present

## 2013-02-01 DIAGNOSIS — H2510 Age-related nuclear cataract, unspecified eye: Secondary | ICD-10-CM | POA: Diagnosis not present

## 2013-02-07 DIAGNOSIS — J309 Allergic rhinitis, unspecified: Secondary | ICD-10-CM | POA: Diagnosis not present

## 2013-02-14 DIAGNOSIS — H2589 Other age-related cataract: Secondary | ICD-10-CM | POA: Diagnosis not present

## 2013-02-14 DIAGNOSIS — H25049 Posterior subcapsular polar age-related cataract, unspecified eye: Secondary | ICD-10-CM | POA: Diagnosis not present

## 2013-02-14 DIAGNOSIS — H2510 Age-related nuclear cataract, unspecified eye: Secondary | ICD-10-CM | POA: Diagnosis not present

## 2013-02-18 DIAGNOSIS — J309 Allergic rhinitis, unspecified: Secondary | ICD-10-CM | POA: Diagnosis not present

## 2013-02-21 DIAGNOSIS — H2510 Age-related nuclear cataract, unspecified eye: Secondary | ICD-10-CM | POA: Diagnosis not present

## 2013-02-21 DIAGNOSIS — H25049 Posterior subcapsular polar age-related cataract, unspecified eye: Secondary | ICD-10-CM | POA: Diagnosis not present

## 2013-02-21 DIAGNOSIS — H2589 Other age-related cataract: Secondary | ICD-10-CM | POA: Diagnosis not present

## 2013-02-26 DIAGNOSIS — J309 Allergic rhinitis, unspecified: Secondary | ICD-10-CM | POA: Diagnosis not present

## 2013-03-04 DIAGNOSIS — Z1231 Encounter for screening mammogram for malignant neoplasm of breast: Secondary | ICD-10-CM | POA: Diagnosis not present

## 2013-03-04 DIAGNOSIS — Z803 Family history of malignant neoplasm of breast: Secondary | ICD-10-CM | POA: Diagnosis not present

## 2013-03-04 DIAGNOSIS — J309 Allergic rhinitis, unspecified: Secondary | ICD-10-CM | POA: Diagnosis not present

## 2013-03-11 ENCOUNTER — Encounter: Payer: Self-pay | Admitting: Internal Medicine

## 2013-03-11 DIAGNOSIS — J309 Allergic rhinitis, unspecified: Secondary | ICD-10-CM | POA: Diagnosis not present

## 2013-03-19 DIAGNOSIS — J309 Allergic rhinitis, unspecified: Secondary | ICD-10-CM | POA: Diagnosis not present

## 2013-03-25 DIAGNOSIS — J309 Allergic rhinitis, unspecified: Secondary | ICD-10-CM | POA: Diagnosis not present

## 2013-03-28 DIAGNOSIS — J309 Allergic rhinitis, unspecified: Secondary | ICD-10-CM | POA: Diagnosis not present

## 2013-04-01 DIAGNOSIS — Z1212 Encounter for screening for malignant neoplasm of rectum: Secondary | ICD-10-CM | POA: Diagnosis not present

## 2013-04-01 DIAGNOSIS — N951 Menopausal and female climacteric states: Secondary | ICD-10-CM | POA: Diagnosis not present

## 2013-04-01 DIAGNOSIS — J309 Allergic rhinitis, unspecified: Secondary | ICD-10-CM | POA: Diagnosis not present

## 2013-04-01 DIAGNOSIS — M810 Age-related osteoporosis without current pathological fracture: Secondary | ICD-10-CM | POA: Diagnosis not present

## 2013-04-05 DIAGNOSIS — J309 Allergic rhinitis, unspecified: Secondary | ICD-10-CM | POA: Diagnosis not present

## 2013-04-07 ENCOUNTER — Emergency Department (INDEPENDENT_AMBULATORY_CARE_PROVIDER_SITE_OTHER)
Admission: EM | Admit: 2013-04-07 | Discharge: 2013-04-07 | Disposition: A | Discharge status: Home / Self Care | Payer: Medicare Other | Source: Home / Self Care | Attending: Family Medicine | Admitting: Family Medicine

## 2013-04-07 ENCOUNTER — Encounter (HOSPITAL_COMMUNITY): Payer: Self-pay | Admitting: *Deleted

## 2013-04-07 DIAGNOSIS — IMO0002 Reserved for concepts with insufficient information to code with codable children: Secondary | ICD-10-CM | POA: Diagnosis not present

## 2013-04-07 DIAGNOSIS — W57XXXA Bitten or stung by nonvenomous insect and other nonvenomous arthropods, initial encounter: Secondary | ICD-10-CM

## 2013-04-07 DIAGNOSIS — S90861A Insect bite (nonvenomous), right foot, initial encounter: Secondary | ICD-10-CM

## 2013-04-07 NOTE — ED Notes (Signed)
States a tick has lodged itself between right 4th and 5th toes. States swelling and tenderness with itches.

## 2013-04-07 NOTE — ED Provider Notes (Signed)
History     CSN: [id number]  Arrival date & time 04/07/13  1110   First MD Initiated Contact with Patient 04/07/13 1123      Chief Complaint  Patient presents with  . Tick Removal    (Consider location/radiation/quality/duration/timing/severity/associated sxs/prior treatment) Patient is a 72 y.o. female presenting with rash. The history is provided by the patient.  Rash Location:  Foot Foot rash location:  Sole of R foot Quality comment:  Tick attached to 5th toe, no erythema or tenderness Chronicity:  New   Past Medical History  Diagnosis Date  . Ataxia   . Emphysema   . Diverticulosis   . Renal cyst   . Osteopenia   . GERD (gastroesophageal reflux disease)   . Asthma   . Iron deficiency anemia, unspecified   . Kidney cysts 11/16/12    small cyst on left    Past Surgical History  Procedure Laterality Date  . Tonsillectomy    . Breast biopsy    . Cervical spine surgery  2010    Family History  Problem Relation Age of Onset  . Colon cancer Father   . Coronary artery disease Father   . Pancreatic cancer Mother   . Colon cancer Paternal Grandmother   . Colon cancer Paternal Aunt   . Breast cancer Paternal Aunt   . Ovarian cancer Maternal Aunt     History  Substance Use Topics  . Smoking status: Never Smoker   . Smokeless tobacco: Never Used  . Alcohol Use: No    OB History   Grav Para Term Preterm Abortions TAB SAB Ect Mult Living                  Review of Systems  Constitutional: Negative.   Skin: Positive for rash.    Allergies  Iohexol  Home Medications   Current Outpatient Rx  Name  Route  Sig  Dispense  Refill  . aspirin 81 MG tablet   Oral   Take 81 mg by mouth daily.           Marland Kitchen b complex vitamins tablet   Oral   Take 1 tablet by mouth daily.         . Calcium Carbonate-Vitamin D (SM CALCIUM-VITAMIN D) 600-400 MG-UNIT per tablet   Oral   Take 1 tablet by mouth daily.          . cholecalciferol (VITAMIN D) 1000  UNITS tablet   Oral   Take 1,000 Units by mouth daily.         . Ginkgo Biloba 100 MG CAPS   Oral   Take 2 capsules by mouth daily.         . Multiple Vitamins-Minerals (CENTRUM SILVER PO)   Oral   Take 1 capsule by mouth daily.           Marland Kitchen omeprazole (PRILOSEC) 40 MG capsule   Oral   Take 1 capsule (40 mg total) by mouth daily.   90 capsule   3   . vitamin E 600 UNIT capsule   Oral   Take 600 Units by mouth daily.           Marland Kitchen azithromycin (ZITHROMAX) 250 MG tablet      As directed   6 tablet   0     BP 154/96  Pulse 77  Temp(Src) 98.2 F (36.8 C) (Oral)  Resp 16  SpO2 100%  Physical Exam  Nursing note and vitals reviewed.  Constitutional: She is oriented to person, place, and time. She appears well-developed and well-nourished.  Musculoskeletal: She exhibits no tenderness.  Neurological: She is alert and oriented to person, place, and time.  Skin: Skin is warm and dry.  Tick present and removed with forceps without difficulty,    ED Course  Procedures (including critical care time)  Labs Reviewed - No data to display No results found.   1. Tick bite of foot, right, initial encounter       MDM          Linna Hoff, MD 04/07/13 1139

## 2013-04-09 ENCOUNTER — Ambulatory Visit: Payer: Self-pay | Admitting: Neurology

## 2013-04-09 DIAGNOSIS — J309 Allergic rhinitis, unspecified: Secondary | ICD-10-CM | POA: Diagnosis not present

## 2013-04-10 ENCOUNTER — Other Ambulatory Visit (HOSPITAL_COMMUNITY): Payer: Self-pay | Admitting: Urology

## 2013-04-10 DIAGNOSIS — N281 Cyst of kidney, acquired: Secondary | ICD-10-CM

## 2013-04-12 DIAGNOSIS — J309 Allergic rhinitis, unspecified: Secondary | ICD-10-CM | POA: Diagnosis not present

## 2013-04-19 DIAGNOSIS — J309 Allergic rhinitis, unspecified: Secondary | ICD-10-CM | POA: Diagnosis not present

## 2013-05-03 DIAGNOSIS — J309 Allergic rhinitis, unspecified: Secondary | ICD-10-CM | POA: Diagnosis not present

## 2013-05-13 ENCOUNTER — Encounter: Payer: Self-pay | Admitting: Neurology

## 2013-05-13 DIAGNOSIS — L570 Actinic keratosis: Secondary | ICD-10-CM | POA: Diagnosis not present

## 2013-05-13 DIAGNOSIS — L259 Unspecified contact dermatitis, unspecified cause: Secondary | ICD-10-CM | POA: Diagnosis not present

## 2013-05-13 DIAGNOSIS — J309 Allergic rhinitis, unspecified: Secondary | ICD-10-CM | POA: Diagnosis not present

## 2013-05-13 HISTORY — PX: SKIN CANCER EXCISION: SHX779

## 2013-05-14 ENCOUNTER — Encounter: Payer: Self-pay | Admitting: Neurology

## 2013-05-14 ENCOUNTER — Ambulatory Visit (INDEPENDENT_AMBULATORY_CARE_PROVIDER_SITE_OTHER): Payer: Medicare Other | Admitting: Neurology

## 2013-05-14 VITALS — BP 150/86 | HR 98 | Temp 99.1°F | Resp 14 | Ht 61.0 in | Wt 89.0 lb

## 2013-05-14 DIAGNOSIS — R269 Unspecified abnormalities of gait and mobility: Secondary | ICD-10-CM | POA: Insufficient documentation

## 2013-05-14 DIAGNOSIS — H5509 Other forms of nystagmus: Secondary | ICD-10-CM | POA: Insufficient documentation

## 2013-05-14 HISTORY — DX: Unspecified abnormalities of gait and mobility: R26.9

## 2013-05-14 NOTE — Patient Instructions (Signed)
Prevenção contra quedas e segurança no lar   (Fall Prevention and Home Safety)  As quedas causam ferimentos e podem afetar todos os grupos etários. É possível utilizar medidas preventivas para reduzir significativamente a probabilidade das quedas. Há diversas medidas simples que podem tornar seu lar mais seguro e evitar quedas.   PARTE EXTERNA   · Conserte fendas e beiras de calçadas e garagens.  · Remova obstáculos na entrada.  · Pode os arbustos no caminho principal de sua casa.  · Tenha uma boa iluminação externa.  · Limpe as calçadas de cabos, pedras, detritos e entulho.  · Verifique se os corrimões não estão quebrados e se estão seguramente fixados. Ambos os lados dos degraus devem ter corrimão.  · Tire folhas, neve e gelo regularmente.  · Utilize areia ou sal nas calçadas durante os meses de inverno.  · Na garagem, limpe a graxa ou respingos de óleo.  BANHEIRO   · Instale luzes noturnas.  · Instale barras de segurança no vaso sanitário, banheira e chuveiro.  · Utilize tapetes antiderrapantes ou adesivos na banheira ou chuveiro.  · Coloque uma banqueta antiderrapante plástica no chuveiro para sentar, caso necessário.  · Mantenha o piso seco e limpe qualquer água no piso imediatamente.  · Remova formações de sabão na banheira ou chuveiro regularmente.  · Prenda os tapetes com fita antiderrapante dupla face.  · Remova tapetes pequenos e obstáculos perigosos do chão.  QUARTOS   · Instale luzes noturnas.  · Certifique-se de que uma luz ao lado da cama seja facilmente alcançada.  · Não use roupa de cama muito grande.  · Mantenha um telefone ao lado da cama.  · Tenha uma cadeira firme com braços para usar ao se vestir.  · Remova tapetes pequenos e obstáculos perigosos do chão.  COZINHA   · Mantenha cabos de panelas e frigideiras virados para o centro do fogão. Utilize as bocas traseiras quando possível.  · Limpe os respingos rapidamente e aguarde secar.  · Evite caminhar em chão molhado.  · Evite utensílios quentes e  facas.  · Posicione as prateleiras para não ficarem muito altas ou baixas.  · Coloque objetos rotineiros em fácil alcance.  · Caso necessário, utilize uma banqueta com degrau robusta com uma barra de proteção de fácil alcance.  · Mantenha fios elétricos fora do alcance.  · Não utilize polimento ou cera de chão que tornem o piso escorregadio. Se for utilizar cera, utilize uma do tipo antiderrapante.  · Remova tapetes pequenos e obstáculos perigosos do chão.  ESCADAS   · Nunca deixe objetos em escadas.  · Coloque corrimões em ambos os lados e utilize-os. Conserte qualquer corrimão solto. Certifique-se de que os corrimões acompanhem a escada até o fim.  · Verifique o carpete para ter certeza que está firmemente preso à escada. Faça reparos em carpetes gastos ou frouxos imediatamente.  · Evite colocar tapetes pequenos no topo ou na parte de baixo da escada, ou prenda-os firmemente com fita de carpete para evitar escorregões. Livre-se de tapetes pequenos, se possível.  · Providencie interruptores no topo e na parte de baixo da escada.  OUTRAS DICAS DE PREVENÇÃO DE QUEDAS   · Vista sapatos de salto baixo ou de borracha que dão apoio e se encaixam bem. Vista sapatos fechados.  · Ao utilizar escadinhas, tenha certeza que estejam completamente abertas e ambos os lados estejam firmemente travados. Não suba em uma escadinha fechada.  · Adicione cores, ou tintura ou fita de contraste nas barras de proteção   e corrimões de sua casa. Coloque cor de contraste no primeiro e último degrau.  · Aprenda como utilizar suportes de mobilidade, conforme necessário. Instale um sistema elétrico de resposta de emergência.  · Acenda as luzes para evitar áreas escuras. Substitua lâmpadas queimadas imediatamente. Compre interruptores de luz que se acendem.  · Arrume a mobília para criar caminhos livres. Mantenha a mobília no mesmo lugar.  · Prenda o tapete firmemente com fita antiderrapante ou de dupla face.  · Elimine superfícies de piso  desniveladas.  · Selecione um padrão de tapete que não esconda visualmente a beirada dos degraus.  · Tome cuidado com animais domésticos.  OUTRAS DICAS DE SEGURANÇA NO LAR   · Ajuste a temperatura da água para 120 ºF (48,8 ºC).  · Mantenha números de emergência próximos ao telefone.  · Mantenha detectores de fumaça em cada piso da casa e próximos à áreas de dormir.  Document Released: 03/26/2009 Document Revised: 05/08/2012  [employer]® Patient Information ©2014 [employer].

## 2013-05-14 NOTE — Progress Notes (Signed)
[facility]  Provider:  Dr Vickey Huger Referring Provider: Jacques Navy, MD Primary Care Physician:  Illene Regulus, MD  Chief Complaint  Patient presents with  . Follow-up    GAIT, RM 10    HPI:  Kristine Francis is a 72 y.o. female here as a revisit , originally from Dr. Debby Bud for a nystagmus and   gait disorder.  The patient was just last month he did urgent care after a tic bite, there perhaps situationally elevated her blood pressure was over 180 systolic, she also reports that she has stumbled and fallen in the last 6 months she felt that physical therapy and gait stabilization had helped in the past and she was happy to learn that her MRI showed an old cerebellar atrophy or strokes that could have contributed to a fall risk.   The patient is not depressed, GDS  Is 3  Points,  her fall risk is set at 11 points, and then as he Mini-Mental status examination is 29/30 points. She is mainly here for routine check up. She is excited today because she just had a new grand baby born this morning this is her third.     Review of Systems: Out of a complete 14 system review, the patient complains of only the following symptoms, and all other reviewed systems are negative. unsteady gait , stumbling over tings, incontinence.  Normal Brain MRI and neuro-ophthamological exam 2013 .  History   Social History  . Marital Status: Married    Spouse Name: N/A    Number of Children: 3  . Years of Education: 16   Occupational History  . newspaper Programmer, multimedia     [employer]  .     Social History Main Topics  . Smoking status: Never Smoker   . Smokeless tobacco: Never Used  . Alcohol Use: No  . Drug Use: No  . Sexually Active: Yes -- Female partner(s)   Other Topics Concern  . 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 married - 1965. 3 sons - '66, '69, '70. Sons in good health. Marriage in good health.  Enjoys retirement - remains active.          Family History  Problem Relation Age of Onset  . Colon cancer Father   . Coronary artery disease Father   . Pancreatic cancer Mother   . Colon cancer Paternal Grandmother   . Colon cancer Paternal Aunt   . Cancer - Lung Paternal Aunt   . Breast cancer Paternal Aunt   . Ovarian cancer Maternal Aunt   . Heart attack Other     Past Medical History  Diagnosis Date  . Ataxia   . Emphysema   . Diverticulosis   . Renal cyst   . Osteopenia   . GERD (gastroesophageal reflux disease)   . Asthma   . Iron deficiency anemia, unspecified   . Kidney cysts 11/16/12    small cyst on left  . Gait disorder   . Nystagmus, end-position   . Gait abnormality 05/14/2013    Mrs. leak, a patient of Dr. Gaye Alken presented first interpreter thousand 13 with a gait dysfunction, she also had an episodic confusion and Loss device. Exam found a mild nystagmus and she was referred to ophthalmology on 03-2812, brain MRI was normal nystagmus was a secondary diagnosis to vestibulitis, ear nose and throat has followed and had seen the patient.  At the time of the nystagmus the patie    Past Surgical History  Procedure Laterality Date  . Tonsillectomy    . Breast biopsy    . Cervical spine surgery  2010  . Hernia repair      years ago  . Cataract extraction Bilateral LT:02/14/13,RT:02/21/13  . Skin cancer excision  05/13/13    FACE    Current Outpatient Prescriptions  Medication Sig Dispense Refill  . aspirin 81 MG tablet Take 81 mg by mouth daily.        Marland Kitchen b complex vitamins tablet Take 1 tablet by mouth daily.      Marland Kitchen CALCIUM CARBONATE PO Take 1,200 mg by mouth. SLOW RELEASE , 2 TABLETS 1 TIME DAILY      . cholecalciferol (VITAMIN D) 1000 UNITS tablet Take 1,000 Units by mouth daily.      . Ginkgo Biloba 100 MG CAPS Take 2 capsules by mouth daily.      Marland Kitchen Lysine 500 MG TABS Take by mouth daily.      . Multiple Vitamins-Minerals (CENTRUM SILVER PO) Take 1 capsule  by mouth daily.        Marland Kitchen omeprazole (PRILOSEC) 40 MG capsule Take 1 capsule (40 mg total) by mouth daily.  90 capsule  3  . vitamin E 600 UNIT capsule Take 600 Units by mouth daily.         No current facility-administered medications for this visit.    Allergies as of 05/14/2013 - Review Complete 05/14/2013  Allergen Reaction Noted  . Iohexol Other (See Comments) 03/24/2004    Vitals: BP 150/86  Pulse 98  Temp(Src) 99.1 F (37.3 C) (Oral)  Resp 14  Ht 5\' 1"  (1.549 m)  Wt 89 lb (40.37 kg)  BMI 16.83 kg/m2 Last Weight:  Wt Readings from Last 1 Encounters:  05/14/13 89 lb (40.37 kg)   Last Height:   Ht Readings from Last 1 Encounters:  05/14/13 5\' 1"  (1.549 m)     Physical exam:  General: The patient is awake, alert and appears not in acute distress. The patient is well groomed. Head: Normocephalic, atraumatic. Neck is supple. Mallampati 3 , neck circumference: 13.5 ,   Cardiovascular:  Regular rate and rhythm, without  murmurs or carotid bruit, and without distended neck veins. Respiratory: Lungs are clear to auscultation. Skin:  Without evidence of edema, or rash Trunk: slender ,  patient  has normal posture.  Neurologic exam : The patient is awake and alert, oriented to place and time.  Memory subjective described as intact, MMSE 29-30 today , GDS 3 points, Fall risk 11 points.   There is a normal attention span & concentration ability.  Speech is fluent without  dysarthria, dysphonia or aphasia. Mood and affect are appropriate.  Cranial nerves: Pupils are equal and briskly reactive to light. Funduscopic exam without evidence of pallor or edema. Extraocular movements  in vertical and horizontal planes intact,  and with the  described  Nystagmus ( known for 6 years plus) . Visual fields by finger perimetry are restricted, she has severely limited perception of the  peripheral  visual field. Hearing to finger rub intact.  Facial sensation intact to fine touch. Facial  motor strength is symmetric and tongue and uvula move midline.  Motor exam:   Normal tone and normal muscle bulk and symmetric normal strength in all extremities.  Sensory:  Fine touch, pinprick and vibration were tested in all extremities.  Proprioception is normal.  Coordination: Rapid  alternating movements in the fingers/hands is tested and normal. Finger-to-nose maneuver  without evidence of ataxia, dysmetria. There is a very mild, non disabling  tremor.  Gait and station: Patient walks without assistive device and is able and assisted stool climb up to the exam table. Strength within normal limits. Stance is stable and normal. Tandem gait is fragmented. Romberg testing is positive .  Deep tendon reflexes: in the  upper and lower extremities are symmetric and intact. Babinski maneuver response is  downgoing.   Assessment:  After physical and neurologic examination, review of laboratory studies, imaging, neurophysiology testing and pre-existing records,  This patient continues to present with unchanged nystagmus almost 7 years now, and it is well possible that her nystagmus was her of her life without being noticed initially. Brain MRIs have not confirmed any cerebellar pathology and the olivopontine angle was intact there were no strokes and no significant atrophy. Ophthalmological evaluation was the 9.  The patient has trouble with her balance she has to look at her feet were sure that she is not stumbling over objects in her nystagmus likely as a part of this gait disorder. Her fall risk is elevated based on those today at 11 points. She had no confusional at results there is no evidence of memory loss on her Mini-Mental exam and the patient appears not depressed Ig rheumatology depression score of 3 points .   I will offer her a repeat gait and balance stability exercise within bruit heart next-door, she can in a take it up over the next 30 days as she likes.   I don't think there is a  yearly revisit necessary and he can keep that when necessary , Melvyn Novas M.D.    Plan:  Treatment plan  With rehab PT.

## 2013-05-17 ENCOUNTER — Ambulatory Visit: Payer: Medicare Other | Attending: Neurology | Admitting: Physical Therapy

## 2013-05-17 DIAGNOSIS — R279 Unspecified lack of coordination: Secondary | ICD-10-CM | POA: Diagnosis not present

## 2013-05-17 DIAGNOSIS — R269 Unspecified abnormalities of gait and mobility: Secondary | ICD-10-CM | POA: Insufficient documentation

## 2013-05-17 DIAGNOSIS — IMO0001 Reserved for inherently not codable concepts without codable children: Secondary | ICD-10-CM | POA: Diagnosis not present

## 2013-05-17 DIAGNOSIS — Z9181 History of falling: Secondary | ICD-10-CM | POA: Diagnosis not present

## 2013-05-20 ENCOUNTER — Ambulatory Visit: Payer: Medicare Other | Admitting: Physical Therapy

## 2013-05-21 ENCOUNTER — Ambulatory Visit (HOSPITAL_COMMUNITY)
Admission: RE | Admit: 2013-05-21 | Discharge: 2013-05-21 | Disposition: A | Discharge status: Home / Self Care | Payer: Medicare Other | Source: Ambulatory Visit | Attending: Urology | Admitting: Urology

## 2013-05-21 ENCOUNTER — Ambulatory Visit: Payer: Medicare Other | Attending: Neurology | Admitting: Physical Therapy

## 2013-05-21 DIAGNOSIS — N281 Cyst of kidney, acquired: Secondary | ICD-10-CM | POA: Insufficient documentation

## 2013-05-21 DIAGNOSIS — IMO0001 Reserved for inherently not codable concepts without codable children: Secondary | ICD-10-CM | POA: Insufficient documentation

## 2013-05-21 DIAGNOSIS — R279 Unspecified lack of coordination: Secondary | ICD-10-CM | POA: Diagnosis not present

## 2013-05-21 DIAGNOSIS — Z9181 History of falling: Secondary | ICD-10-CM | POA: Insufficient documentation

## 2013-05-21 DIAGNOSIS — R269 Unspecified abnormalities of gait and mobility: Secondary | ICD-10-CM | POA: Insufficient documentation

## 2013-05-21 DIAGNOSIS — K7689 Other specified diseases of liver: Secondary | ICD-10-CM | POA: Insufficient documentation

## 2013-05-21 LAB — CREATININE, SERUM
Creatinine, Ser: 0.6 mg/dL (ref 0.50–1.10)
GFR calc Af Amer: >90 mL/min (ref >90)
GFR calc non Af Amer: 90 mL/min — ABNORMAL LOW (ref >90)

## 2013-05-21 MED ORDER — GADOBENATE DIMEGLUMINE 529 MG/ML IV SOLN
10.0000 mL | Freq: Once | INTRAVENOUS | Status: AC
  Administered 2013-05-21: 8 mL via INTRAVENOUS

## 2013-05-27 ENCOUNTER — Ambulatory Visit: Payer: Medicare Other | Admitting: Physical Therapy

## 2013-05-27 DIAGNOSIS — N281 Cyst of kidney, acquired: Secondary | ICD-10-CM | POA: Diagnosis not present

## 2013-05-27 DIAGNOSIS — J309 Allergic rhinitis, unspecified: Secondary | ICD-10-CM | POA: Diagnosis not present

## 2013-05-31 ENCOUNTER — Ambulatory Visit: Payer: Medicare Other | Admitting: Physical Therapy

## 2013-06-03 ENCOUNTER — Ambulatory Visit: Payer: Medicare Other | Admitting: Physical Therapy

## 2013-06-07 ENCOUNTER — Ambulatory Visit: Payer: Medicare Other

## 2013-06-07 DIAGNOSIS — J309 Allergic rhinitis, unspecified: Secondary | ICD-10-CM | POA: Diagnosis not present

## 2013-06-10 ENCOUNTER — Ambulatory Visit: Payer: Medicare Other | Admitting: Physical Therapy

## 2013-06-14 ENCOUNTER — Ambulatory Visit: Payer: Medicare Other | Admitting: Physical Therapy

## 2013-06-17 ENCOUNTER — Ambulatory Visit: Payer: Medicare Other | Admitting: Physical Therapy

## 2013-06-21 ENCOUNTER — Ambulatory Visit: Payer: Medicare Other | Admitting: Physical Therapy

## 2013-06-21 DIAGNOSIS — J309 Allergic rhinitis, unspecified: Secondary | ICD-10-CM | POA: Diagnosis not present

## 2013-06-29 ENCOUNTER — Other Ambulatory Visit: Payer: Self-pay | Admitting: Internal Medicine

## 2013-07-05 DIAGNOSIS — J309 Allergic rhinitis, unspecified: Secondary | ICD-10-CM | POA: Diagnosis not present

## 2013-07-23 DIAGNOSIS — J309 Allergic rhinitis, unspecified: Secondary | ICD-10-CM | POA: Diagnosis not present

## 2013-07-25 DIAGNOSIS — K4090 Unilateral inguinal hernia, without obstruction or gangrene, not specified as recurrent: Secondary | ICD-10-CM | POA: Diagnosis not present

## 2013-08-02 ENCOUNTER — Encounter (INDEPENDENT_AMBULATORY_CARE_PROVIDER_SITE_OTHER): Payer: Self-pay | Admitting: Surgery

## 2013-08-02 ENCOUNTER — Ambulatory Visit (INDEPENDENT_AMBULATORY_CARE_PROVIDER_SITE_OTHER): Payer: Medicare Other | Admitting: Surgery

## 2013-08-02 VITALS — BP 128/86 | HR 74 | Temp 97.4°F | Ht 61.0 in | Wt 88.4 lb

## 2013-08-02 DIAGNOSIS — K4090 Unilateral inguinal hernia, without obstruction or gangrene, not specified as recurrent: Secondary | ICD-10-CM

## 2013-08-02 NOTE — Patient Instructions (Signed)
Stop your aspirin 5 days before surgery.

## 2013-08-02 NOTE — Progress Notes (Signed)
Patient ID: Kristine Francis, female   DOB: 1940/12/15, 72 y.o.   MRN: [id number]  Chief Complaint  Patient presents with  . New Evaluation    eval hernia    HPI Kristine Francis is a 72 y.o. female.  Referred by Dr. Lodema Hong (OB/GYN).  PCP - Dr. Debby Bud  Evaluation of left inguinal hernia HPI This is a 72 yo female who is s/p open repair of right inguinal hernia by Dr. Kendrick Ranch in 1993.  This was a primary repair with Prolene suture.  She presents with some soreness in her right groin when she bends over.  She also has developed a noticeable bulge in her left groin.  She denies any significant pain in the left groin.  She is fairly active and cares for her young grandchildren several days a week.  She denies any obstructive symptoms.    Past Medical History  Diagnosis Date  . Ataxia   . Emphysema   . Diverticulosis   . Renal cyst   . Osteopenia   . GERD (gastroesophageal reflux disease)   . Asthma   . Iron deficiency anemia, unspecified   . Kidney cysts 11/16/12    small cyst on left  . Gait disorder   . Nystagmus, end-position   . Gait abnormality 05/14/2013    Kristine Francis a patient of Dr. Debby Bud presented first interpreter thousand 13 with a gait dysfunction, she also had an episodic confusion and Loss device. Exam found a mild nystagmus and she was referred to ophthalmology on 03-2812, brain MRI was normal nystagmus was a secondary diagnosis to vestibulitis, ear nose and throat has followed and had seen the patient. At the time of the nystagmus the patie    Past Surgical History  Procedure Laterality Date  . Tonsillectomy    . Breast biopsy    . Cervical spine surgery  2010  . Hernia repair      years ago  . Cataract extraction Bilateral LT:02/14/13,RT:02/21/13  . Skin cancer excision  05/13/13    FACE  . Eye surgery Bilateral 01/2013&02/2013    left then right    Family History  Problem Relation Age of Onset  . Colon cancer Father   . Coronary artery disease Father   .  Cancer Father     Colon  . Heart block Father   . Pancreatic cancer Mother   . Cancer Mother     Pancreatic  . Colon cancer Paternal Grandmother   . Cancer Paternal Grandmother     Colon  . Colon cancer Paternal Aunt   . Cancer - Lung Paternal Aunt   . Cancer Paternal Aunt     Breast  . Breast cancer Paternal Aunt   . Cancer Paternal Aunt     Colon  . Ovarian cancer Maternal Aunt   . Cancer Maternal Aunt     Ovarian  . Heart attack Other   . Cancer Paternal Aunt     Colon    Social History History  Substance Use Topics  . Smoking status: Never Smoker   . Smokeless tobacco: Never Used  . Alcohol Use: No    Allergies  Allergen Reactions  . Contrast Media [Iodinated Diagnostic Agents]     congestion  . Iohexol Other (See Comments)    Gi upset    Current Outpatient Prescriptions  Medication Sig Dispense Refill  . aspirin 81 MG tablet Take 81 mg by mouth daily.        Marland Kitchen b  complex vitamins tablet Take 1 tablet by mouth daily.      Marland Kitchen CALCIUM CARBONATE PO Take 1,200 mg by mouth. SLOW RELEASE , 2 TABLETS 1 TIME DAILY      . cholecalciferol (VITAMIN D) 1000 UNITS tablet Take 1,000 Units by mouth daily.      . Ginkgo Biloba 100 MG CAPS Take 2 capsules by mouth daily.      Marland Kitchen Lysine 500 MG TABS Take by mouth daily.      . Multiple Vitamins-Minerals (CENTRUM SILVER PO) Take 1 capsule by mouth daily.        Marland Kitchen omeprazole (PRILOSEC) 40 MG capsule TAKE 1 CAPSULE DAILY  90 capsule  0  . vitamin E 600 UNIT capsule Take 600 Units by mouth daily.         No current facility-administered medications for this visit.    Review of Systems Review of Systems  Constitutional: Negative for fever, chills and unexpected weight change.  HENT: Negative for hearing loss, congestion, sore throat, trouble swallowing and voice change.   Eyes: Negative for visual disturbance.  Respiratory: Negative for cough and wheezing.   Cardiovascular: Negative for chest pain, palpitations and leg swelling.   Gastrointestinal: Positive for abdominal pain (right groin pain/ left groin bulge). Negative for nausea, vomiting, diarrhea, constipation, blood in stool, abdominal distention and anal bleeding.  Genitourinary: Negative for hematuria, vaginal bleeding and difficulty urinating.  Musculoskeletal: Negative for arthralgias.  Skin: Negative for rash and wound.  Neurological: Negative for seizures, syncope and headaches.  Hematological: Negative for adenopathy. Does not bruise/bleed easily.  Psychiatric/Behavioral: Negative for confusion.    Blood pressure 128/86, pulse 74, temperature 97.4 F (36.3 C), temperature source Temporal, height 5\' 1"  (1.549 m), weight 88 lb 6.4 oz (40.098 kg), SpO2 97.00%.  Physical Exam Physical Exam WDWN in NAD HEENT:  EOMI, sclera anicteric Neck:  No masses, no thyromegaly Lungs:  CTA bilaterally; normal respiratory effort CV:  Regular rate and rhythm; no murmurs Abd:  +bowel sounds, soft, non-tender, no masses Groin:  Healed right inguinal incision.  Palpable sutures in groin - no sign of right inguinal hernia on Valsalva.  Large visible reducible left inguinal hernia. Ext:  Well-perfused; no edema Skin:  Warm, dry; no sign of jaundice  Data Reviewed None  Assessment    Left inguinal hernia - reducible Right groin strain - no hernia    Plan    Left inguinal hernia repair with mesh.  The surgical procedure has been discussed with the patient.  Potential risks, benefits, alternative treatments, and expected outcomes have been explained.  All of the patient's questions at this time have been answered.  The likelihood of reaching the patient's treatment goal is good.  The patient understand the proposed surgical procedure and wishes to proceed.        Kumar Falwell K. 08/02/2013, 3:39 PM

## 2013-08-05 DIAGNOSIS — J309 Allergic rhinitis, unspecified: Secondary | ICD-10-CM | POA: Diagnosis not present

## 2013-08-19 DIAGNOSIS — J309 Allergic rhinitis, unspecified: Secondary | ICD-10-CM | POA: Diagnosis not present

## 2013-08-26 ENCOUNTER — Other Ambulatory Visit (INDEPENDENT_AMBULATORY_CARE_PROVIDER_SITE_OTHER): Payer: Self-pay | Admitting: *Deleted

## 2013-08-26 DIAGNOSIS — K4090 Unilateral inguinal hernia, without obstruction or gangrene, not specified as recurrent: Secondary | ICD-10-CM

## 2013-08-26 MED ORDER — HYDROCODONE-ACETAMINOPHEN 5-325 MG PO TABS: 1.0000 | ORAL_TABLET | ORAL | Status: DC | PRN

## 2013-09-11 ENCOUNTER — Encounter (INDEPENDENT_AMBULATORY_CARE_PROVIDER_SITE_OTHER): Payer: Self-pay | Admitting: Surgery

## 2013-09-11 ENCOUNTER — Other Ambulatory Visit: Payer: Self-pay | Admitting: Internal Medicine

## 2013-09-11 ENCOUNTER — Ambulatory Visit (INDEPENDENT_AMBULATORY_CARE_PROVIDER_SITE_OTHER): Payer: Medicare Other | Admitting: Surgery

## 2013-09-11 VITALS — BP 126/72 | HR 68 | Temp 98.1°F | Resp 14 | Ht 61.0 in | Wt 92.0 lb

## 2013-09-11 DIAGNOSIS — K4090 Unilateral inguinal hernia, without obstruction or gangrene, not specified as recurrent: Secondary | ICD-10-CM

## 2013-09-11 DIAGNOSIS — J309 Allergic rhinitis, unspecified: Secondary | ICD-10-CM | POA: Diagnosis not present

## 2013-09-11 NOTE — Progress Notes (Signed)
Status post open repair of a Left inguinal hernia on 08/26/13. The patient is doing quite well. She has had minimal pain. The swelling seems to be resolving. Appetite and bowel movements are normal.  Filed Vitals:   09/11/13 1350  BP: 126/72  Pulse: 68  Temp: 98.1 F (36.7 C)  Resp: 14   Her left inguinal incision is well-healed with no sign of infection. She has some firm scar tissue underneath the incision but no sign of recurrent hernia.  The patient is only 2 weeks after her left inguinal hernia repair. I encouraged her to limit her level of activity for at least the next 3 weeks. At that time she may resume full activity. Followup as needed.  Wilmon Arms. Corliss Skains, MD, [facility] Surgery  General/ Trauma Surgery  09/11/2013 4:57 PM

## 2013-09-18 ENCOUNTER — Ambulatory Visit (INDEPENDENT_AMBULATORY_CARE_PROVIDER_SITE_OTHER): Payer: Medicare Other

## 2013-09-18 DIAGNOSIS — Z23 Encounter for immunization: Secondary | ICD-10-CM | POA: Diagnosis not present

## 2013-09-24 DIAGNOSIS — J309 Allergic rhinitis, unspecified: Secondary | ICD-10-CM | POA: Diagnosis not present

## 2013-09-27 DIAGNOSIS — H2000 Unspecified acute and subacute iridocyclitis: Secondary | ICD-10-CM | POA: Diagnosis not present

## 2013-09-27 DIAGNOSIS — S0500XA Injury of conjunctiva and corneal abrasion without foreign body, unspecified eye, initial encounter: Secondary | ICD-10-CM | POA: Diagnosis not present

## 2013-10-07 DIAGNOSIS — J309 Allergic rhinitis, unspecified: Secondary | ICD-10-CM | POA: Diagnosis not present

## 2013-10-21 DIAGNOSIS — J309 Allergic rhinitis, unspecified: Secondary | ICD-10-CM | POA: Diagnosis not present

## 2013-10-21 DIAGNOSIS — J3089 Other allergic rhinitis: Secondary | ICD-10-CM | POA: Diagnosis not present

## 2013-10-21 DIAGNOSIS — J3081 Allergic rhinitis due to animal (cat) (dog) hair and dander: Secondary | ICD-10-CM | POA: Diagnosis not present

## 2013-11-12 DIAGNOSIS — J309 Allergic rhinitis, unspecified: Secondary | ICD-10-CM | POA: Diagnosis not present

## 2013-12-09 DIAGNOSIS — J309 Allergic rhinitis, unspecified: Secondary | ICD-10-CM | POA: Diagnosis not present

## 2013-12-12 DIAGNOSIS — J309 Allergic rhinitis, unspecified: Secondary | ICD-10-CM | POA: Diagnosis not present

## 2013-12-24 ENCOUNTER — Encounter: Payer: Self-pay | Admitting: Internal Medicine

## 2013-12-24 ENCOUNTER — Telehealth: Payer: Self-pay | Admitting: Internal Medicine

## 2013-12-24 NOTE — Telephone Encounter (Signed)
I have spoken to patient to advise that it has been over 2 years since seeing her (I asked that she schedule office visit in 06/2013), therefore, she should take omeprazole OTC until we see her in the office. She verbalizes understanding.

## 2013-12-27 ENCOUNTER — Encounter: Payer: Self-pay | Admitting: *Deleted

## 2013-12-30 DIAGNOSIS — J309 Allergic rhinitis, unspecified: Secondary | ICD-10-CM | POA: Diagnosis not present

## 2014-01-06 DIAGNOSIS — J309 Allergic rhinitis, unspecified: Secondary | ICD-10-CM | POA: Diagnosis not present

## 2014-01-14 DIAGNOSIS — J309 Allergic rhinitis, unspecified: Secondary | ICD-10-CM | POA: Diagnosis not present

## 2014-01-17 DIAGNOSIS — J309 Allergic rhinitis, unspecified: Secondary | ICD-10-CM | POA: Diagnosis not present

## 2014-01-21 DIAGNOSIS — J309 Allergic rhinitis, unspecified: Secondary | ICD-10-CM | POA: Diagnosis not present

## 2014-01-22 ENCOUNTER — Ambulatory Visit (INDEPENDENT_AMBULATORY_CARE_PROVIDER_SITE_OTHER): Payer: Medicare Other | Admitting: Family Medicine

## 2014-01-22 ENCOUNTER — Encounter: Payer: Self-pay | Admitting: Family Medicine

## 2014-01-22 VITALS — BP 118/70 | HR 81 | Temp 98.7°F | Ht 61.0 in | Wt 90.0 lb

## 2014-01-22 DIAGNOSIS — R300 Dysuria: Secondary | ICD-10-CM | POA: Diagnosis not present

## 2014-01-22 DIAGNOSIS — N390 Urinary tract infection, site not specified: Secondary | ICD-10-CM | POA: Diagnosis not present

## 2014-01-22 DIAGNOSIS — R309 Painful micturition, unspecified: Secondary | ICD-10-CM

## 2014-01-22 LAB — POCT URINALYSIS DIPSTICK
Bilirubin, UA: NEGATIVE
Glucose, UA: NEGATIVE
Ketones, UA: NEGATIVE
Nitrite, UA: NEGATIVE
Spec Grav, UA: 1.01
Urobilinogen, UA: 0.2
pH, UA: 5

## 2014-01-22 MED ORDER — CIPROFLOXACIN HCL 500 MG PO TABS: 500.0000 mg | ORAL_TABLET | Freq: Two times a day (BID) | ORAL | Status: DC

## 2014-01-22 NOTE — Progress Notes (Signed)
   Subjective:    Patient ID: Kristine Francis, female    DOB: 1941-11-21, 73 y.o.   MRN: [id number]  HPI Here for 2 days of urinary urgency and burning. No fever.    Review of Systems  Constitutional: Negative.   Genitourinary: Positive for dysuria and urgency. Negative for hematuria.       Objective:   Physical Exam  Constitutional: She appears well-developed and well-nourished.  Abdominal: Soft. Bowel sounds are normal. She exhibits no distension and no mass. There is no tenderness. There is no rebound and no guarding.          Assessment & Plan:  Start on Cipro, drink water.

## 2014-01-22 NOTE — Progress Notes (Signed)
Pre visit review using our clinic review tool, if applicable. No additional management support is needed unless otherwise documented below in the visit note. 

## 2014-01-24 DIAGNOSIS — J309 Allergic rhinitis, unspecified: Secondary | ICD-10-CM | POA: Diagnosis not present

## 2014-01-24 LAB — URINE CULTURE: Colony Count: 10000

## 2014-01-28 DIAGNOSIS — J309 Allergic rhinitis, unspecified: Secondary | ICD-10-CM | POA: Diagnosis not present

## 2014-01-29 ENCOUNTER — Ambulatory Visit (INDEPENDENT_AMBULATORY_CARE_PROVIDER_SITE_OTHER): Payer: Medicare Other | Admitting: Internal Medicine

## 2014-01-29 ENCOUNTER — Other Ambulatory Visit (INDEPENDENT_AMBULATORY_CARE_PROVIDER_SITE_OTHER): Payer: Medicare Other

## 2014-01-29 ENCOUNTER — Encounter: Payer: Self-pay | Admitting: Internal Medicine

## 2014-01-29 VITALS — BP 130/70 | HR 83 | Temp 97.2°F | Resp 14 | Ht 61.0 in | Wt 90.8 lb

## 2014-01-29 DIAGNOSIS — R269 Unspecified abnormalities of gait and mobility: Secondary | ICD-10-CM

## 2014-01-29 DIAGNOSIS — Z0000 Encounter for general adult medical examination without abnormal findings: Secondary | ICD-10-CM

## 2014-01-29 DIAGNOSIS — K219 Gastro-esophageal reflux disease without esophagitis: Secondary | ICD-10-CM | POA: Diagnosis not present

## 2014-01-29 DIAGNOSIS — M899 Disorder of bone, unspecified: Secondary | ICD-10-CM

## 2014-01-29 DIAGNOSIS — J438 Other emphysema: Secondary | ICD-10-CM | POA: Diagnosis not present

## 2014-01-29 DIAGNOSIS — Z23 Encounter for immunization: Secondary | ICD-10-CM

## 2014-01-29 DIAGNOSIS — M5412 Radiculopathy, cervical region: Secondary | ICD-10-CM | POA: Diagnosis not present

## 2014-01-29 DIAGNOSIS — D509 Iron deficiency anemia, unspecified: Secondary | ICD-10-CM

## 2014-01-29 DIAGNOSIS — J45909 Unspecified asthma, uncomplicated: Secondary | ICD-10-CM

## 2014-01-29 DIAGNOSIS — Z136 Encounter for screening for cardiovascular disorders: Secondary | ICD-10-CM | POA: Diagnosis not present

## 2014-01-29 DIAGNOSIS — M949 Disorder of cartilage, unspecified: Secondary | ICD-10-CM

## 2014-01-29 LAB — COMPREHENSIVE METABOLIC PANEL
ALT: 25 U/L (ref 0–35)
AST: 31 U/L (ref 0–37)
Albumin: 4.2 g/dL (ref 3.5–5.2)
Alkaline Phosphatase: 58 U/L (ref 39–117)
BUN: 15 mg/dL (ref 6–23)
CO2: 29 mEq/L (ref 19–32)
Calcium: 9.6 mg/dL (ref 8.4–10.5)
Chloride: 102 mEq/L (ref 96–112)
Creatinine, Ser: 0.6 mg/dL (ref 0.4–1.2)
GFR: 104.53 mL/min (ref >60.00)
Glucose, Bld: 101 mg/dL — ABNORMAL HIGH (ref 70–99)
Potassium: 3.8 mEq/L (ref 3.5–5.1)
Sodium: 137 mEq/L (ref 135–145)
Total Bilirubin: 1.4 mg/dL — ABNORMAL HIGH (ref 0.3–1.2)
Total Protein: 7.1 g/dL (ref 6.0–8.3)

## 2014-01-29 LAB — HEMOGLOBIN AND HEMATOCRIT, BLOOD
HCT: 39.1 % (ref 36.0–46.0)
Hemoglobin: 12.9 g/dL (ref 12.0–15.0)

## 2014-01-29 LAB — LIPID PANEL
Cholesterol: 217 mg/dL — ABNORMAL HIGH (ref 0–200)
HDL: 99.7 mg/dL (ref >39.00)
LDL Cholesterol: 108 mg/dL — ABNORMAL HIGH (ref 0–99)
Total CHOL/HDL Ratio: 2
Triglycerides: 46 mg/dL (ref 0.0–149.0)
VLDL: 9.2 mg/dL (ref 0.0–40.0)

## 2014-01-29 NOTE — Patient Instructions (Signed)
Thanks for the banana bread and thank you for allowing me to be your physician over the years.  Your exam is normal today.   Routine labs today and results will be posted to MyChart  Immunizations - you are current except for Prevnar, a pnumonia vaccine, which we will give today.  Moving forward your care will be transferred to Dr. Sarajane Jews at [place].

## 2014-01-29 NOTE — Progress Notes (Signed)
Subjective:    Patient ID: Kristine Francis, female    DOB: Jan 26, 1941, 73 y.o.   MRN: [id number]  HPI The patient is here for annual Medicare wellness examination and management of other chronic and acute problems.  She has been doing well. She has had inguinal hernia repair left by Deedra Ehrich during the year. She has had some paresthesia in the left hand.    The risk factors are reflected in the social history.  The roster of all physicians providing medical care to patient - is listed in the Snapshot section of the chart.  Activities of daily living:  The patient is 100% inedpendent in all ADLs: dressing, toileting, feeding as well as independent mobility  Home safety : The patient has smoke detectors in the home. They wear seatbelts. No firearms at home . There is no violence in the home.   There is no risks for hepatitis, STDs or HIV. There is no   history of blood transfusion. They have no travel history to infectious disease endemic areas of the world.  The patient has seen their dentist in the last six month. They have seen their eye doctor in the last year. They deny any hearing difficulty and have not had audiologic testing in the last year.    They do not  have excessive sun exposure. Discussed the need for sun protection: hats, long sleeves and use of sunscreen if there is significant sun exposure.   Diet: the importance of a healthy diet is discussed. They do have a healthy diet.  The patient has no regular exercise program.  The benefits of regular aerobic exercise were discussed.  Depression screen: there are no signs or vegative symptoms of depression- irritability, change in appetite, anhedonia, sadness/tearfullness.  Cognitive assessment: the patient manages all their financial and personal affairs and is actively engaged.   The following portions of the patient's history were reviewed and updated as appropriate: allergies, current medications, past family history,  past medical history,  past surgical history, past social history  and problem list.  Vision, hearing, body mass index were assessed and reviewed.   During the course of the visit the patient was educated and counseled about appropriate screening and preventive services including : fall prevention , diabetes screening, nutrition counseling, colorectal cancer screening, and recommended immunizations.  Past Medical History  Diagnosis Date  . Ataxia   . Emphysema   . Diverticulosis   . Renal cyst   . Osteopenia   . GERD (gastroesophageal reflux disease)   . Asthma   . Iron deficiency anemia, unspecified   . Kidney cysts 11/16/12    small cyst on left  . Gait disorder   . Nystagmus, end-position   . Gait abnormality 05/14/2013    Ms.Diekman a patient of Dr. Linda Hedges presented first interpreter thousand 13 with a gait dysfunction, she also had an episodic confusion and Loss device. Exam found a mild nystagmus and she was referred to ophthalmology on 03-2812, brain MRI was normal nystagmus was a secondary diagnosis to vestibulitis, ear nose and throat has followed and had seen the patient. At the time of the nystagmus the patie  . Hepatic steatosis    Past Surgical History  Procedure Laterality Date  . Tonsillectomy    . Breast biopsy    . Cervical spine surgery  2010  . Hernia repair  Oct '14-left, remote-right    years ago right; left done '14  . Cataract extraction Bilateral LT:02/14/13,RT:02/21/13  .  Skin cancer excision  05/13/13    FACE  . Eye surgery Bilateral 01/2013&02/2013    left then right   Family History  Problem Relation Age of Onset  . Colon cancer Father   . Coronary artery disease Father   . Cancer Father     Colon  . Heart block Father   . Pancreatic cancer Mother   . Cancer Mother     Pancreatic  . Colon cancer Paternal Grandmother   . Cancer Paternal Grandmother     Colon  . Colon cancer Paternal Aunt   . Cancer - Lung Paternal Aunt   . Cancer Paternal Aunt       Breast  . Breast cancer Paternal Aunt   . Cancer Paternal Aunt     Colon  . Ovarian cancer Maternal Aunt   . Cancer Maternal Aunt     Ovarian  . Heart attack Other   . Cancer Paternal Aunt     Colon   History   Social History  . Marital Status: Married    Spouse Name: N/A    Number of Children: 3  . Years of Education: 16   Occupational History  . 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     Social History Main Topics  . Smoking status: Never Smoker   . Smokeless tobacco: Never Used  . Alcohol Use: No  . Drug Use: No  . Sexual Activity: Yes    Partners: Male   Other Topics Concern  . 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 married - 1965. 3 sons - '66, '69, '70. Sons in good health. Marriage in good health. Enjoys retirement - remains active.         Current Outpatient Prescriptions on File Prior to Visit  Medication Sig Dispense Refill  . aspirin 81 MG tablet Take 81 mg by mouth daily.        Marland Kitchen b complex vitamins tablet Take 1 tablet by mouth daily.      Marland Kitchen CALCIUM CARBONATE PO Take 1,200 mg by mouth. SLOW RELEASE , 2 TABLETS 1 TIME DAILY      . cholecalciferol (VITAMIN D) 1000 UNITS tablet Take 1,000 Units by mouth daily.      . ciprofloxacin (CIPRO) 500 MG tablet Take 1 tablet (500 mg total) by mouth 2 (two) times daily.  20 tablet  0  . Ginkgo Biloba 100 MG CAPS Take 2 capsules by mouth daily.      Marland Kitchen Lysine 500 MG TABS Take by mouth daily.      . Multiple Vitamins-Minerals (CENTRUM SILVER PO) Take 1 capsule by mouth daily.        Marland Kitchen omeprazole (PRILOSEC) 40 MG capsule TAKE 1 CAPSULE DAILY  90 capsule  0  . vitamin E 600 UNIT capsule Take 600 Units by mouth daily.         No current facility-administered medications on file prior to visit.      Review of Systems Constitutional:  Negative for fever, chills, activity change and unexpected weight change.  HEENT:  Negative for hearing loss, ear  pain, congestion, neck stiffness and postnasal drip. Negative for sore throat or swallowing problems. Negative for dental complaints.   Eyes: Negative for vision loss or change in visual acuity.  Respiratory: Negative for chest tightness and wheezing. Negative for DOE.   Cardiovascular: Negative for chest pain or palpitations.  No decreased exercise tolerance Gastrointestinal: No change in bowel habit. No bloating or gas. No reflux or indigestion Genitourinary: Negative for urgency, frequency, flank pain and difficulty urinating. Mild stress incontinence. Musculoskeletal: Negative for myalgias, back pain, arthralgias and gait problem.  Neurological: Negative for dizziness, tremors, weakness and headaches.  Hematological: Negative for adenopathy.  Psychiatric/Behavioral: Negative for behavioral problems and dysphoric mood.       Objective:   Physical Exam Filed Vitals:   01/29/14 1111  BP: 130/70  Pulse: 83  Temp: 97.2 F (36.2 C)  Resp: 14   Wt Readings from Last 3 Encounters:  01/29/14 90 lb 12 oz (41.164 kg)  01/22/14 90 lb (40.824 kg)  09/11/13 92 lb (41.731 kg)   Gen'l: well nourished, well developed, slender Woman in no distress HEENT - [place]/AT, EACs/TMs normal, oropharynx with native dentition in good condition, no buccal or palatal lesions, posterior pharynx clear, mucous membranes moist. C&S clear, PERRLA, fundi - normal Neck - supple, no thyromegaly Nodes- negative submental, cervical, supraclavicular regions Chest - no deformity, no CVAT Lungs - clear without rales, wheezes. No increased work of breathing Breast - deferred to normal mammography Cardiovascular - regular rate and rhythm, quiet precordium, no murmurs, rubs or gallops, 2+ radial, DP and PT pulses Abdomen - BS+ x 4, no HSM, no guarding or rebound or tenderness Pelvic - deferred to age Rectal - deferred to GI Extremities - no clubbing, cyanosis, edema or deformity.  Neuro - A&O x 3, CN II-XII normal, motor  strength normal and equal, DTRs 2+ and symmetrical biceps, radial, and patellar tendons. Cerebellar - no tremor, no rigidity, fluid movement and normal gait. Observed to have some minor swallow trouble with secretions Derm - Head, neck, back, abdomen and extremities without suspicious lesions  Recent Results (from the past 2160 hour(s))  POCT URINALYSIS DIPSTICK     Status: None   Collection Time    01/22/14  1:38 PM      Result Value Ref Range   Color, UA yellow     Clarity, UA cloudy     Glucose, UA n     Bilirubin, UA n     Ketones, UA n     Spec Grav, UA 1.010     Blood, UA trace     pH, UA 5.0     Protein, UA trace     Urobilinogen, UA 0.2     Nitrite, UA n     Leukocytes, UA moderate (2+)    URINE CULTURE     Status: None   Collection Time    01/22/14  2:23 PM      Result Value Ref Range   Culture KLEBSIELLA PNEUMONIAE     Colony Count 10,000 COLONIES/ML     Organism ID, Bacteria KLEBSIELLA PNEUMONIAE    LIPID PANEL     Status: Abnormal   Collection Time    01/29/14 11:58 AM      Result Value Ref Range   Cholesterol 217 (*) 0 - 200 mg/dL   Comment: ATP III Classification       Desirable:  < 200 mg/dL               Borderline High:  200 - 239 mg/dL          High:  > = 240 mg/dL   Triglycerides 46.0  0.0 - 149.0 mg/dL   Comment: Normal:  <150 mg/dLBorderline High:  150 - 199 mg/dL   HDL 99.70  >39.00 mg/dL  VLDL 9.2  0.0 - 40.0 mg/dL   LDL Cholesterol 108 (*) 0 - 99 mg/dL   Total CHOL/HDL Ratio 2     Comment:                Men          Women1/2 Average Risk     3.4          3.3Average Risk          5.0          4.42X Average Risk          9.6          7.13X Average Risk          15.0          11.0                      COMPREHENSIVE METABOLIC PANEL     Status: Abnormal   Collection Time    01/29/14 11:58 AM      Result Value Ref Range   Sodium 137  135 - 145 mEq/L   Potassium 3.8  3.5 - 5.1 mEq/L   Chloride 102  96 - 112 mEq/L   CO2 29  19 - 32 mEq/L   Glucose,  Bld 101 (*) 70 - 99 mg/dL   BUN 15  6 - 23 mg/dL   Creatinine, Ser 0.6  0.4 - 1.2 mg/dL   Total Bilirubin 1.4 (*) 0.3 - 1.2 mg/dL   Alkaline Phosphatase 58  39 - 117 U/L   AST 31  0 - 37 U/L   ALT 25  0 - 35 U/L   Total Protein 7.1  6.0 - 8.3 g/dL   Albumin 4.2  3.5 - 5.2 g/dL   Calcium 9.6  8.4 - 10.5 mg/dL   GFR 104.53  >60.00 mL/min  HEMOGLOBIN AND HEMATOCRIT, BLOOD     Status: None   Collection Time    01/29/14 11:58 AM      Result Value Ref Range   Hemoglobin 12.9  12.0 - 15.0 g/dL   HCT 39.1  36.0 - 46.0 %          Assessment & Plan:

## 2014-01-29 NOTE — Progress Notes (Signed)
Pre visit review using our clinic review tool, if applicable. No additional management support is needed unless otherwise documented below in the visit note. 

## 2014-01-31 NOTE — Assessment & Plan Note (Signed)
Interval history is benign. Limited physical exam is normal. Labs reviewed - normal including normal blood sugar, lipid panel, renal function. She is current with colorectal and breast cancer screening. Immunizations - due for Shingles vaccine.  In summary A very nice woman who is medically stable. She will return as needed or 1 year.

## 2014-01-31 NOTE — Assessment & Plan Note (Signed)
No record of DEXA scan since 2011. She is a small framed and thin woman.  Plan - may need f/u DEXA

## 2014-01-31 NOTE — Assessment & Plan Note (Signed)
More of a radiographic finding: she is asymptomatic and not limited in her activity. She does not have PFTs on record. She takes no medication.  Plan For any symptoms PFTs will be helpful.

## 2014-01-31 NOTE — Assessment & Plan Note (Signed)
Currently asymptomatic on daily PPI therapy. No dysphagia and no pain.   Plan Continue PPI or she may be a candidate to convert to H2 blocker therapy if she is willing.

## 2014-01-31 NOTE — Assessment & Plan Note (Signed)
Presently doing well. No limitation in function and no limitation in activity.

## 2014-01-31 NOTE — Assessment & Plan Note (Signed)
No symptoms, on no meds.

## 2014-01-31 NOTE — Assessment & Plan Note (Signed)
Persistent balance issue - she has accomodated to this.

## 2014-02-04 DIAGNOSIS — J309 Allergic rhinitis, unspecified: Secondary | ICD-10-CM | POA: Diagnosis not present

## 2014-02-10 DIAGNOSIS — J309 Allergic rhinitis, unspecified: Secondary | ICD-10-CM | POA: Diagnosis not present

## 2014-02-11 ENCOUNTER — Ambulatory Visit (INDEPENDENT_AMBULATORY_CARE_PROVIDER_SITE_OTHER): Payer: Medicare Other | Admitting: Internal Medicine

## 2014-02-11 ENCOUNTER — Encounter: Payer: Self-pay | Admitting: Internal Medicine

## 2014-02-11 ENCOUNTER — Ambulatory Visit: Payer: Medicare Other | Admitting: Internal Medicine

## 2014-02-11 VITALS — BP 120/80 | HR 70 | Ht 61.0 in | Wt 90.2 lb

## 2014-02-11 DIAGNOSIS — K219 Gastro-esophageal reflux disease without esophagitis: Secondary | ICD-10-CM

## 2014-02-11 DIAGNOSIS — Z800 Family history of malignant neoplasm of digestive organs: Secondary | ICD-10-CM | POA: Diagnosis not present

## 2014-02-11 MED ORDER — MOVIPREP 100 G PO SOLR: 1.0000 | Freq: Once | ORAL | Status: DC

## 2014-02-11 NOTE — Patient Instructions (Addendum)
You have been scheduled for a colonoscopy with propofol. Please follow written instructions given to you at your visit today.  Please pick up your prep kit at the pharmacy within the next 1-3 days. If you use inhalers (even only as needed), please bring them with you on the day of your procedure. Your physician has requested that you go to www.startemmi.com and enter the access code given to you at your visit today. This web site gives a general overview about your procedure. However, you should still follow specific instructions given to you by our office regarding your preparation for the procedure.  We have sent the following medications to your pharmacy for you to pick up at your convenience: Omeprazole 40 mg  CC: Dr Linda Hedges

## 2014-02-11 NOTE — Progress Notes (Signed)
Kristine Francis 04/13/1941 [id number]  Note: This dictation was prepared with Dragon digital system. Any transcriptional errors that result from this procedure are unintentional.   History of Present Illness:  This is a 73 year old white female who comes for refills of omeprazole 40 mg daily. She is also due for a recall colonoscopy. She has a history of gastroesophageal reflux. Her last upper endoscopy in March 2009 did not show evidence of Barrett's esophagus. She has had intermittent pill dysphagia following cervical spine surgery in 2009. There is a family history of colon cancer in her father as well as polypshis mother and  uncle. Her last colonoscopy was April 2010. She has regular bowel habits. There has been occasional leakage of a small amount of stool for which she wears Panty Liners.    Past Medical History  Diagnosis Date  . Ataxia   . Emphysema   . Diverticulosis   . Renal cyst   . Osteopenia   . GERD (gastroesophageal reflux disease)   . Asthma   . Iron deficiency anemia, unspecified   . Kidney cysts 11/16/12    small cyst on left  . Gait disorder   . Nystagmus, end-position   . Gait abnormality 05/14/2013    Kristine Francis a patient of Dr. Linda Hedges presented first interpreter thousand 13 with a gait dysfunction, she also had an episodic confusion and Loss device. Exam found a mild nystagmus and she was referred to ophthalmology on 03-2812, brain MRI was normal nystagmus was a secondary diagnosis to vestibulitis, ear nose and throat has followed and had seen the patient. At the time of the nystagmus the patie  . Hepatic steatosis     Past Surgical History  Procedure Laterality Date  . Tonsillectomy    . Breast biopsy Bilateral 1970's  . Cervical spine surgery  2010  . Hernia repair  Oct '14-left, remote-right    years ago right; left done '14  . Cataract extraction Bilateral LT:02/14/13,RT:02/21/13  . Skin cancer excision  05/13/13    FACE  . Eye surgery Bilateral  01/2013&02/2013    left then right    Allergies  Allergen Reactions  . Contrast Media [Iodinated Diagnostic Agents]     congestion  . Iohexol Other (See Comments)    Gi upset    Family history and social history have been reviewed.  Review of Systems: Negative for heartburn. Positive for stable weight The remainder of the 10 point ROS is negative except as outlined in the H&P  Physical Exam: General Appearance very thin in no distress Eyes  Non icteric  HEENT  Non traumatic, normocephalic  Mouth No lesion, tongue papillated, no cheilosis Neck Supple without adenopathy, thyroid not enlarged, no carotid bruits, no JVD Lungs Clear to auscultation bilaterally COR Normal S1, normal S2, regular rhythm, no murmur, quiet precordium Abdomen soft scaphoid. Nontender. Normoactive bowel sounds. No distention. Liver edge at costal margin Rectal slightly decreased rectal sphincter tone. Motor direct amount of stool in the rectum. Hemoccult negative Extremities  No pedal edema Skin No lesions Neurological Alert and oriented x 3 Psychological Normal mood and affect  Assessment and Plan:   Problem #1 Gastroesophageal reflux under good control with omeprazole 40 mg a day. We will refill her prescription.  Problem #2 Colorectal screening. Positive family history of colon cancer in direct relatives and several indirect relatives. She is due for a recall colonoscopy in April 2015. I suggested Metamucil 1 heaping teaspoon daily to regulate her bowel habits.    Sydell Axon  Olevia Perches 02/11/2014

## 2014-02-17 DIAGNOSIS — J309 Allergic rhinitis, unspecified: Secondary | ICD-10-CM | POA: Diagnosis not present

## 2014-02-26 DIAGNOSIS — J309 Allergic rhinitis, unspecified: Secondary | ICD-10-CM | POA: Diagnosis not present

## 2014-03-04 ENCOUNTER — Encounter: Payer: Medicare Other | Admitting: Internal Medicine

## 2014-03-04 DIAGNOSIS — J309 Allergic rhinitis, unspecified: Secondary | ICD-10-CM | POA: Diagnosis not present

## 2014-03-12 ENCOUNTER — Ambulatory Visit (AMBULATORY_SURGERY_CENTER): Payer: Medicare Other | Admitting: Internal Medicine

## 2014-03-12 ENCOUNTER — Encounter: Payer: Self-pay | Admitting: Internal Medicine

## 2014-03-12 VITALS — BP 160/94 | HR 72 | Temp 97.3°F | Resp 28 | Ht 61.0 in | Wt 90.0 lb

## 2014-03-12 DIAGNOSIS — R279 Unspecified lack of coordination: Secondary | ICD-10-CM | POA: Diagnosis not present

## 2014-03-12 DIAGNOSIS — Z800 Family history of malignant neoplasm of digestive organs: Secondary | ICD-10-CM

## 2014-03-12 DIAGNOSIS — Z1211 Encounter for screening for malignant neoplasm of colon: Secondary | ICD-10-CM

## 2014-03-12 DIAGNOSIS — J45909 Unspecified asthma, uncomplicated: Secondary | ICD-10-CM | POA: Diagnosis not present

## 2014-03-12 HISTORY — PX: COLONOSCOPY: SHX174

## 2014-03-12 MED ORDER — SODIUM CHLORIDE 0.9 % IV SOLN: 500.0000 mL | INTRAVENOUS | Status: DC

## 2014-03-12 NOTE — Patient Instructions (Addendum)
YOU HAD AN ENDOSCOPIC PROCEDURE TODAY AT THE [facility]: Refer to the procedure report that was given to you for any specific questions about what was found during the examination.  If the procedure report does not answer your questions, please call your gastroenterologist to clarify.  If you requested that your care partner not be given the details of your procedure findings, then the procedure report has been included in a sealed envelope for you to review at your convenience later.  YOU SHOULD EXPECT: Some feelings of bloating in the abdomen. Passage of more gas than usual.  Walking can help get rid of the air that was put into your GI tract during the procedure and reduce the bloating. If you had a lower endoscopy (such as a colonoscopy or flexible sigmoidoscopy) you may notice spotting of blood in your stool or on the toilet paper. If you underwent a bowel prep for your procedure, then you may not have a normal bowel movement for a few days.  DIET: Your first meal following the procedure should be a light meal and then it is ok to progress to your normal diet.  A half-sandwich or bowl of soup is an example of a good first meal.  Heavy or fried foods are harder to digest and may make you feel nauseous or bloated.  Likewise meals heavy in dairy and vegetables can cause extra gas to form and this can also increase the bloating.  Drink plenty of fluids but you should avoid alcoholic beverages for 24 hours.  ACTIVITY: Your care partner should take you home directly after the procedure.  You should plan to take it easy, moving slowly for the rest of the day.  You can resume normal activity the day after the procedure however you should NOT DRIVE or use heavy machinery for 24 hours (because of the sedation medicines used during the test).    SYMPTOMS TO REPORT IMMEDIATELY: A gastroenterologist can be reached at any hour.  During normal business hours, 8:30 AM to 5:00 PM Monday through Friday,  call [phone].  After hours and on weekends, please call the GI answering service at [phone] who will take a message and have the physician on call contact you.   Following lower endoscopy (colonoscopy or flexible sigmoidoscopy):  Excessive amounts of blood in the stool  Significant tenderness or worsening of abdominal pains  Swelling of the abdomen that is new, acute  Fever of 100F or higher    FOLLOW UP: If any biopsies were taken you will be contacted by phone or by letter within the next 1-3 weeks.  Call your gastroenterologist if you have not heard about the biopsies in 3 weeks.  Our staff will call the home number listed on your records the next business day following your procedure to check on you and address any questions or concerns that you may have at that time regarding the information given to you following your procedure. This is a courtesy call and so if there is no answer at the home number and we have not heard from you through the emergency physician on call, we will assume that you have returned to your regular daily activities without incident.  SIGNATURES/CONFIDENTIALITY: You and/or your care partner have signed paperwork which will be entered into your electronic medical record.  These signatures attest to the fact that that the information above on your After Visit Summary has been reviewed and is understood.  Full responsibility of the confidentiality   of this discharge information lies with you and/or your care-partner.   Handouts were given to your care partner on  Diverticulosis and high fiber diet with liberal fluid intake. You may resume your current medications today.. Please call if any questions or concerns.

## 2014-03-12 NOTE — Progress Notes (Signed)
Pt told Dr. Olevia Perches that she may call her Medical MD tomorrow and she if he could see her for cough. Per Dr. Mardene Celeste idea. Maw

## 2014-03-12 NOTE — Progress Notes (Signed)
Per Barkley Boards, CRNA pt had a cough pre-procedure.  I asked the pt when cough began.  She responded last Sat.night.  Pt has a non-productive cough and cough off and on while in the recovery room.  Pt denied any discomfort while in recovery. Maw

## 2014-03-12 NOTE — Op Note (Signed)
[facility]  [address] [place], [id number]   COLONOSCOPY PROCEDURE REPORT  PATIENT: Kristine Francis, Kristine Francis  MR#: [id number] BIRTHDATE: October 03, 1941 , 72  yrs. old GENDER: Female ENDOSCOPIST: Lafayette Dragon, MD REFERRED [id number] Raymon Mutton, M.D. PROCEDURE DATE:  03/12/2014 PROCEDURE:   Colonoscopy, screening First Screening Colonoscopy - Avg.  risk and is 50 yrs.  old or older - No.  Prior Negative Screening - Now for repeat screening. Above average risk  History of Adenoma - Now for follow-up colonoscopy & has been > or = to 3 yrs.  N/A  Polyps Removed Today? No.  Recommend repeat exam, <10 yrs? Yes.  High risk (family or personal hx). ASA CLASS:   Class II INDICATIONS:family history of colon cancer in patient's father. Prior colonoscopy in 1991, 1993, 1997, 2000, 2005, and April 2010.  MEDICATIONS: propofol (Diprivan) 350mg  IV  DESCRIPTION OF PROCEDURE:   After the risks benefits and alternatives of the procedure were thoroughly explained, informed consent was obtained.  A digital rectal exam revealed no abnormalities of the rectum and A digital rectal exam revealed decreased sphincter tone.   The LB [id number]  endoscope was introduced through the anus and advanced to the cecum, which was identified by both the appendix and ileocecal valve. No adverse events experienced.   The quality of the prep was good, using MoviPrep  The instrument was then slowly withdrawn as the colon was fully examined.      COLON FINDINGS: Mild diverticulosis was noted in the sigmoid colon. Retroflexion was not performed due to a narrow rectal vault. The time to cecum=13 minutes 12 seconds.  Withdrawal time=7 minutes 30 seconds.  The scope was withdrawn and the procedure completed. COMPLICATIONS: There were no complications.  ENDOSCOPIC IMPRESSION: Mild diverticulosis was noted in the sigmoid colon decreased rectal sphincter tone, unable to retroflex in the  rectal ampulla  RECOMMENDATIONS: high fiber diet recall colonoscopy in 5 years   eSigned:  Lafayette Dragon, MD 03/12/2014 4:23 PM   cc:   PATIENT NAME:  Kristine Francis, Kristine Francis MR#: [id number]

## 2014-03-13 ENCOUNTER — Telehealth: Payer: Self-pay | Admitting: *Deleted

## 2014-03-13 DIAGNOSIS — J180 Bronchopneumonia, unspecified organism: Secondary | ICD-10-CM | POA: Diagnosis not present

## 2014-03-13 DIAGNOSIS — J3081 Allergic rhinitis due to animal (cat) (dog) hair and dander: Secondary | ICD-10-CM | POA: Diagnosis not present

## 2014-03-13 DIAGNOSIS — J3089 Other allergic rhinitis: Secondary | ICD-10-CM | POA: Diagnosis not present

## 2014-03-13 NOTE — Telephone Encounter (Signed)
  Follow up Call-  Call back number 03/12/2014  Post procedure Call Back phone  # [phone]  Permission to leave phone message Yes     Patient questions:  Do you have a fever, pain , or abdominal swelling? no Pain Score  0 *  Have you tolerated food without any problems? yes  Have you been able to return to your normal activities? yes  Do you have any questions about your discharge instructions: Diet   no Medications  no Follow up visit  no  Do you have questions or concerns about your Care? no  Actions: * If pain score is 4 or above: No action needed, pain <4.

## 2014-03-14 ENCOUNTER — Telehealth: Payer: Self-pay | Admitting: Internal Medicine

## 2014-03-14 MED ORDER — OMEPRAZOLE 40 MG PO CPDR: DELAYED_RELEASE_CAPSULE | ORAL | Status: DC

## 2014-03-14 NOTE — Telephone Encounter (Signed)
Rx for omeprazole sent to [facility].

## 2014-03-24 ENCOUNTER — Ambulatory Visit (INDEPENDENT_AMBULATORY_CARE_PROVIDER_SITE_OTHER): Payer: Medicare Other | Admitting: Family Medicine

## 2014-03-24 ENCOUNTER — Encounter: Payer: Self-pay | Admitting: Family Medicine

## 2014-03-24 VITALS — BP 150/93 | HR 77 | Temp 97.9°F | Ht 61.0 in | Wt 88.0 lb

## 2014-03-24 DIAGNOSIS — J45909 Unspecified asthma, uncomplicated: Secondary | ICD-10-CM | POA: Diagnosis not present

## 2014-03-24 DIAGNOSIS — J209 Acute bronchitis, unspecified: Secondary | ICD-10-CM

## 2014-03-24 MED ORDER — AZITHROMYCIN 250 MG PO TABS: ORAL_TABLET | ORAL | Status: DC

## 2014-03-24 NOTE — Progress Notes (Signed)
Pre visit review using our clinic review tool, if applicable. No additional management support is needed unless otherwise documented below in the visit note. 

## 2014-03-24 NOTE — Progress Notes (Signed)
   Subjective:    Patient ID: Kristine Francis, female    DOB: 11-Jan-1941, 73 y.o.   MRN: [id number]  HPI Here for continued chest congestion and coughing. She saw Dr. Orvil Feil for this on 03-13-14 and she was given 10 days of Avelox 400 mg daily. This helped and she felt a little better, but she still feels a little tight and is coughing up yellow sputum. No fever.    Review of Systems  Constitutional: Negative.   HENT: Negative.   Eyes: Negative.   Respiratory: Positive for cough and chest tightness.   Cardiovascular: Negative.        Objective:   Physical Exam  Constitutional: She appears well-developed and well-nourished.  HENT:  Right Ear: External ear normal.  Left Ear: External ear normal.  Nose: Nose normal.  Mouth/Throat: Oropharynx is clear and moist.  Eyes: Conjunctivae are normal.  Cardiovascular: Normal rate, regular rhythm, normal heart sounds and intact distal pulses.   Pulmonary/Chest: Effort normal and breath sounds normal. No respiratory distress. She has no wheezes. She has no rales.  Lymphadenopathy:    She has no cervical adenopathy.          Assessment & Plan:  Partially treated bronchitis. Recheck prn

## 2014-04-01 DIAGNOSIS — H2510 Age-related nuclear cataract, unspecified eye: Secondary | ICD-10-CM | POA: Diagnosis not present

## 2014-04-01 DIAGNOSIS — Z961 Presence of intraocular lens: Secondary | ICD-10-CM | POA: Diagnosis not present

## 2014-04-07 DIAGNOSIS — J309 Allergic rhinitis, unspecified: Secondary | ICD-10-CM | POA: Diagnosis not present

## 2014-04-11 DIAGNOSIS — Z1231 Encounter for screening mammogram for malignant neoplasm of breast: Secondary | ICD-10-CM | POA: Diagnosis not present

## 2014-04-11 DIAGNOSIS — Z803 Family history of malignant neoplasm of breast: Secondary | ICD-10-CM | POA: Diagnosis not present

## 2014-04-21 DIAGNOSIS — J309 Allergic rhinitis, unspecified: Secondary | ICD-10-CM | POA: Diagnosis not present

## 2014-05-05 DIAGNOSIS — J309 Allergic rhinitis, unspecified: Secondary | ICD-10-CM | POA: Diagnosis not present

## 2014-05-09 DIAGNOSIS — J309 Allergic rhinitis, unspecified: Secondary | ICD-10-CM | POA: Diagnosis not present

## 2014-05-12 DIAGNOSIS — L819 Disorder of pigmentation, unspecified: Secondary | ICD-10-CM | POA: Diagnosis not present

## 2014-05-12 DIAGNOSIS — D485 Neoplasm of uncertain behavior of skin: Secondary | ICD-10-CM | POA: Diagnosis not present

## 2014-05-12 DIAGNOSIS — D235 Other benign neoplasm of skin of trunk: Secondary | ICD-10-CM | POA: Diagnosis not present

## 2014-05-12 DIAGNOSIS — L570 Actinic keratosis: Secondary | ICD-10-CM | POA: Diagnosis not present

## 2014-05-14 DIAGNOSIS — J309 Allergic rhinitis, unspecified: Secondary | ICD-10-CM | POA: Diagnosis not present

## 2014-05-28 DIAGNOSIS — J309 Allergic rhinitis, unspecified: Secondary | ICD-10-CM | POA: Diagnosis not present

## 2014-06-09 DIAGNOSIS — F4320 Adjustment disorder, unspecified: Secondary | ICD-10-CM | POA: Diagnosis not present

## 2014-06-10 DIAGNOSIS — J309 Allergic rhinitis, unspecified: Secondary | ICD-10-CM | POA: Diagnosis not present

## 2014-06-11 ENCOUNTER — Encounter (HOSPITAL_COMMUNITY): Payer: Self-pay | Admitting: Emergency Medicine

## 2014-06-11 ENCOUNTER — Emergency Department (HOSPITAL_COMMUNITY)
Admission: EM | Admit: 2014-06-11 | Discharge: 2014-06-11 | Disposition: A | Discharge status: Home / Self Care | Payer: Medicare Other | Attending: Emergency Medicine | Admitting: Emergency Medicine

## 2014-06-11 DIAGNOSIS — Z8669 Personal history of other diseases of the nervous system and sense organs: Secondary | ICD-10-CM | POA: Diagnosis not present

## 2014-06-11 DIAGNOSIS — Y929 Unspecified place or not applicable: Secondary | ICD-10-CM | POA: Diagnosis not present

## 2014-06-11 DIAGNOSIS — Z862 Personal history of diseases of the blood and blood-forming organs and certain disorders involving the immune mechanism: Secondary | ICD-10-CM | POA: Insufficient documentation

## 2014-06-11 DIAGNOSIS — Z7982 Long term (current) use of aspirin: Secondary | ICD-10-CM | POA: Diagnosis not present

## 2014-06-11 DIAGNOSIS — L03115 Cellulitis of right lower limb: Secondary | ICD-10-CM

## 2014-06-11 DIAGNOSIS — J438 Other emphysema: Secondary | ICD-10-CM | POA: Insufficient documentation

## 2014-06-11 DIAGNOSIS — Z8739 Personal history of other diseases of the musculoskeletal system and connective tissue: Secondary | ICD-10-CM | POA: Insufficient documentation

## 2014-06-11 DIAGNOSIS — Z79899 Other long term (current) drug therapy: Secondary | ICD-10-CM | POA: Diagnosis not present

## 2014-06-11 DIAGNOSIS — S80869A Insect bite (nonvenomous), unspecified lower leg, initial encounter: Principal | ICD-10-CM

## 2014-06-11 DIAGNOSIS — Z792 Long term (current) use of antibiotics: Secondary | ICD-10-CM | POA: Insufficient documentation

## 2014-06-11 DIAGNOSIS — L03119 Cellulitis of unspecified part of limb: Secondary | ICD-10-CM | POA: Diagnosis not present

## 2014-06-11 DIAGNOSIS — Z87448 Personal history of other diseases of urinary system: Secondary | ICD-10-CM | POA: Insufficient documentation

## 2014-06-11 DIAGNOSIS — W57XXXA Bitten or stung by nonvenomous insect and other nonvenomous arthropods, initial encounter: Secondary | ICD-10-CM

## 2014-06-11 DIAGNOSIS — Y9389 Activity, other specified: Secondary | ICD-10-CM | POA: Insufficient documentation

## 2014-06-11 DIAGNOSIS — S90569A Insect bite (nonvenomous), unspecified ankle, initial encounter: Secondary | ICD-10-CM | POA: Diagnosis present

## 2014-06-11 DIAGNOSIS — K219 Gastro-esophageal reflux disease without esophagitis: Secondary | ICD-10-CM | POA: Insufficient documentation

## 2014-06-11 DIAGNOSIS — L02419 Cutaneous abscess of limb, unspecified: Secondary | ICD-10-CM | POA: Diagnosis not present

## 2014-06-11 DIAGNOSIS — L089 Local infection of the skin and subcutaneous tissue, unspecified: Secondary | ICD-10-CM | POA: Insufficient documentation

## 2014-06-11 MED ORDER — DOXYCYCLINE HYCLATE 100 MG PO CAPS: 100.0000 mg | ORAL_CAPSULE | Freq: Two times a day (BID) | ORAL | Status: DC

## 2014-06-11 MED ORDER — DOXYCYCLINE HYCLATE 100 MG PO TABS
100.0000 mg | ORAL_TABLET | Freq: Once | ORAL | Status: AC
  Administered 2014-06-11: 100 mg via ORAL
  Filled 2014-06-11: qty 1

## 2014-06-11 NOTE — ED Notes (Signed)
Patient is from home patient states she was gardening Monday and was bit by something she think because she noticed a bite today. This morning patient states there was a red spot but this evening she noticed the erythema around that spot. Patient states it does not itch and not painful.

## 2014-06-11 NOTE — Discharge Instructions (Signed)
Use warm compresses over the area to help with infection. Followup with your primary care provider for recheck. Return anytime for worsening symptoms.    Cellulitis Cellulitis is an infection of the skin and the tissue under the skin. The infected area is usually red and tender. This happens most often in the arms and lower legs. HOME CARE   Take your antibiotic medicine as told. Finish the medicine even if you start to feel better.  Keep the infected arm or leg raised (elevated).  Put a warm cloth on the area up to 4 times per day.  Only take medicines as told by your doctor.  Keep all doctor visits as told. GET HELP IF:  You see red streaks on the skin coming from the infected area.  Your red area gets bigger or turns a dark color.  Your bone or joint under the infected area is painful after the skin heals.  Your infection comes back in the same area or different area.  You have a puffy (swollen) bump in the infected area.  You have new symptoms.  You have a fever. GET HELP RIGHT AWAY IF:   You feel very sleepy.  You throw up (vomit) or have watery poop (diarrhea).  You feel sick and have muscle aches and pains. MAKE SURE YOU:   Understand these instructions.  Will watch your condition.  Will get help right away if you are not doing well or get worse. Document Released: 04/25/2008 Document Revised: 03/24/2014 Document Reviewed: 01/23/2012 [facility] Patient Information 2015 [place], [place]. This information is not intended to replace advice given to you by your health care provider. Make sure you discuss any questions you have with your health care provider.    Insect Bite Mosquitoes, flies, fleas, bedbugs, and other insects can bite. Insect bites are different from insect stings. The bite may be red, puffy (swollen), and itchy for 2 to 4 days. Most bites get better on their own. HOME CARE   Do not scratch the bite.  Keep the bite clean and dry. Wash the bite  with soap and water.  Put ice on the bite.  Put ice in a plastic bag.  Place a towel between your skin and the bag.  Leave the ice on for 20 minutes, 4 times a day. Do this for the first 2 to 3 days, or as told by your doctor.  You may use medicated lotions or creams to lessen itching as told by your doctor.  Only take medicines as told by your doctor.  If you are given medicines (antibiotics), take them as told. Finish them even if you start to feel better. You may need a tetanus shot if:  You cannot remember when you had your last tetanus shot.  You have never had a tetanus shot.  The injury broke your skin. If you need a tetanus shot and you choose not to have one, you may get tetanus. Sickness from tetanus can be serious. GET HELP RIGHT AWAY IF:   You have more pain, redness, or puffiness.  You see a red line on the skin coming from the bite.  You have a fever.  You have joint pain.  You have a headache or neck pain.  You feel weak.  You have a rash.  You have chest pain, or you are short of breath.  You have belly (abdominal) pain.  You feel sick to your stomach (nauseous) or throw up (vomit).  You feel very tired or  sleepy. MAKE SURE YOU:   Understand these instructions.  Will watch your condition.  Will get help right away if you are not doing well or get worse. Document Released: 11/04/2000 Document Revised: 01/30/2012 Document Reviewed: 06/08/2011 [facility] Patient Information 2015 [place], [place]. This information is not intended to replace advice given to you by your health care provider. Make sure you discuss any questions you have with your health care provider.

## 2014-06-11 NOTE — ED Provider Notes (Signed)
CSN: [id number]     Arrival date & time 06/11/14  2004 History  This chart was scribed for Hazel Sams, PA, working with Leota Jacobsen, MD by Steva Colder, ED Scribe. The patient was seen in room WTR5/WTR5 at 8:48 PM.     Chief Complaint  Patient presents with  . Insect Bite    The history is provided by the patient. No language interpreter was used.   HPI Comments: Kristine Francis is a 73 y.o. female who presents to the Emergency Department complaining of a insect bite onset 2 days ago. She states that she was gardening 2 days ago. She states that the bite did not flare up until this morning. She states that it was just one small red spot on her leg at first. She states that as the day went on she had redness outlining the original red spot on her leg. She states that there is no pain or tenderness to the area. She states that this morning she used Mupirocin ointment with no relief for her symptoms. She states that she had the Mupirocin that her Dermatologist prescribed to her for another ailment that she had in the past. She denies fever, chills, sweats, and any other associated symptoms. She denies any recent travel.   PCP- [name], MD   Past Medical History  Diagnosis Date  . Ataxia   . Emphysema   . Diverticulosis   . Renal cyst   . Osteopenia   . GERD (gastroesophageal reflux disease)   . Iron deficiency anemia, unspecified   . Kidney cysts 11/16/12    small cyst on left  . Gait disorder   . Nystagmus, end-position   . Gait abnormality 05/14/2013    Ms.Palecek a patient of Dr. Linda Hedges presented first interpreter thousand 13 with a gait dysfunction, she also had an episodic confusion and Loss device. Exam found a mild nystagmus and she was referred to ophthalmology on 03-2812, brain MRI was normal nystagmus was a secondary diagnosis to vestibulitis, ear nose and throat has followed and had seen the patient. At the time of the nystagmus the patie  . Hepatic steatosis   .  Allergy   . Asthma     sees Dr. Tiajuana Amass    Past Surgical History  Procedure Laterality Date  . Tonsillectomy    . Breast biopsy Bilateral 1970's  . Cervical spine surgery  2010  . Hernia repair  Oct '14-left, remote-right    years ago right; left done '14  . Cataract extraction Bilateral LT:02/14/13,RT:02/21/13  . Skin cancer excision  05/13/13    FACE  . Eye surgery Bilateral 01/2013&02/2013    left then right   Family History  Problem Relation Age of Onset  . Colon cancer Father   . Coronary artery disease Father   . Cancer Father     Colon  . Heart block Father   . Pancreatic cancer Mother   . Cancer Mother     Pancreatic  . Colon cancer Paternal Grandmother   . Cancer Paternal Grandmother     Colon  . Colon cancer Paternal Aunt   . Cancer - Lung Paternal Aunt   . Cancer Paternal Aunt     Breast  . Breast cancer Paternal Aunt   . Cancer Paternal Aunt     Colon  . Ovarian cancer Maternal Aunt   . Cancer Maternal Aunt     Ovarian  . Heart attack Other   . Cancer Paternal Aunt  Colon   History  Substance Use Topics  . Smoking status: Never Smoker   . Smokeless tobacco: Never Used  . Alcohol Use: No   OB History   Grav Para Term Preterm Abortions TAB SAB Ect Mult Living                 Review of Systems  Constitutional: Negative for fever, chills and diaphoresis.  Gastrointestinal: Negative for nausea and abdominal pain.  Neurological: Negative for light-headedness and headaches.  All other systems reviewed and are negative.     Allergies  Contrast media and Iohexol  Home Medications   Prior to Admission medications   Medication Sig Start Date End Date Taking? Authorizing Provider  aspirin 81 MG tablet Take 81 mg by mouth daily.      Historical Provider, MD  azithromycin (ZITHROMAX) 250 MG tablet As directed 03/24/14   [name], MD  b complex vitamins tablet Take 1 tablet by mouth daily.    Historical Provider, MD  CALCIUM CARBONATE PO Take  1,200 mg by mouth. SLOW RELEASE , 2 TABLETS 1 TIME DAILY    Historical Provider, MD  CALCIUM-MAGNESIUM-ZINC PO Take 1 tablet by mouth 3 (three) times daily.    Historical Provider, MD  Cholecalciferol (VITAMIN D PO) Take 1 capsule by mouth daily.    Historical Provider, MD  Ginkgo Biloba 100 MG CAPS Take 2 capsules by mouth daily.    Historical Provider, MD  Glucosamine Sulfate 1000 MG TABS Take 1 tablet by mouth 2 (two) times daily.    Historical Provider, MD  Lysine 500 MG TABS Take by mouth daily.    Historical Provider, MD  Multiple Vitamins-Minerals (CENTRUM SILVER PO) Take 1 capsule by mouth daily.      Historical Provider, MD  omeprazole (PRILOSEC) 40 MG capsule TAKE 1 CAPSULE DAILY 03/14/14   Lafayette Dragon, MD  vitamin E 600 UNIT capsule Take 600 Units by mouth daily.      Historical Provider, MD   BP 149/85  Pulse 77  Temp(Src) 98.3 F (36.8 C) (Oral)  SpO2 98%  Physical Exam  Nursing note and vitals reviewed. Constitutional: She is oriented to person, place, and time. She appears well-developed and well-nourished. No distress.  HENT:  Head: Normocephalic.  Cardiovascular: Normal rate and regular rhythm.   Pulmonary/Chest: Breath sounds normal. No respiratory distress. She has no wheezes. She has no rales.  Musculoskeletal: Normal range of motion.  Neurological: She is alert and oriented to person, place, and time.  Skin: Skin is warm and dry.  There is a single erythematous lesion to the anterior right thigh. There is a bright vivacious erythematous center with a very small pinpoint pustule-appearing area. Severity is slightly indurated. There is a faint erythematous area surrounding this which is slightly cleared in the middle giving target-appearing lesion.  Psychiatric: She has a normal mood and affect. Her behavior is normal.           ED Course  Procedures  DIAGNOSTIC STUDIES: Oxygen Saturation is 98% on room air, normal by my interpretation.    COORDINATION OF  CARE: 8:59 PM-the patient seen and evaluated. Patient well-appearing denies any other systemic symptoms. Afebrile. Was working outside in the garden. She had no known tick bite. Lesion to the right anterior leg appears more likely cellulitis with very small possible abscess. There is however a slight clearing around the center giving target lesion type appearance. Will plan to cover patient with doxycycline for possibilities of  tickborne illness.  Discussed treatment plan which includes Doxycycline with pt at bedside and pt agreed to plan.    MDM   Final diagnoses:  Insect bite  Cellulitis of right lower extremity      I personally performed the services described in this documentation, which was scribed in my presence. The recorded information has been reviewed and is accurate.    Martie Lee, PA-C 06/11/14 2121

## 2014-06-12 NOTE — ED Provider Notes (Signed)
Medical screening examination/treatment/procedure(s) were performed by non-physician practitioner and as supervising physician I was immediately available for consultation/collaboration.  Leota Jacobsen, MD 06/12/14 2136

## 2014-06-16 DIAGNOSIS — Z124 Encounter for screening for malignant neoplasm of cervix: Secondary | ICD-10-CM | POA: Diagnosis not present

## 2014-06-16 DIAGNOSIS — Z1212 Encounter for screening for malignant neoplasm of rectum: Secondary | ICD-10-CM | POA: Diagnosis not present

## 2014-06-20 DIAGNOSIS — J309 Allergic rhinitis, unspecified: Secondary | ICD-10-CM | POA: Diagnosis not present

## 2014-06-30 ENCOUNTER — Ambulatory Visit (INDEPENDENT_AMBULATORY_CARE_PROVIDER_SITE_OTHER): Payer: Medicare Other

## 2014-06-30 ENCOUNTER — Ambulatory Visit (INDEPENDENT_AMBULATORY_CARE_PROVIDER_SITE_OTHER): Payer: Medicare Other | Admitting: Podiatry

## 2014-06-30 ENCOUNTER — Encounter: Payer: Self-pay | Admitting: Podiatry

## 2014-06-30 VITALS — BP 140/82 | HR 68 | Resp 12

## 2014-06-30 DIAGNOSIS — M2040 Other hammer toe(s) (acquired), unspecified foot: Secondary | ICD-10-CM | POA: Diagnosis not present

## 2014-06-30 DIAGNOSIS — R52 Pain, unspecified: Secondary | ICD-10-CM | POA: Diagnosis not present

## 2014-06-30 DIAGNOSIS — F4320 Adjustment disorder, unspecified: Secondary | ICD-10-CM | POA: Diagnosis not present

## 2014-06-30 DIAGNOSIS — M2010 Hallux valgus (acquired), unspecified foot: Secondary | ICD-10-CM | POA: Diagnosis not present

## 2014-06-30 NOTE — Progress Notes (Signed)
   Subjective:    Patient ID: Kristine Francis, female    DOB: [birth year], 73 y.o.   MRN: [id number]  HPI  PT STATED LT FOOT TOES ARE CURVING THE OTHER WAY BUT DOES NOT HURT. THE TOES  MAKING WALKING HARD AND OFF BALANCE. TRIED TO WEAR GOOD SHOES TO WALK BETTER AND IT HELPS    Review of Systems  All other systems reviewed and are negative.      Objective:   Physical Exam        Assessment & Plan:

## 2014-06-30 NOTE — Progress Notes (Signed)
Subjective:     Patient ID: Kristine Francis, female   DOB: March 13, 1941, 73 y.o.   MRN: [id number]  Toe Pain    patient is concerned about structural malalignment of the left foot and the fact that she is having balance issues and whether they may be related to recheck   Review of Systems  All other systems reviewed and are negative.      Objective:   Physical Exam  Nursing note and vitals reviewed. Constitutional: She is oriented to person, place, and time.  Cardiovascular: Intact distal pulses.   Musculoskeletal: Normal range of motion.  Neurological: She is oriented to person, place, and time.  Skin: Skin is warm and dry.   neurovascular status is found to be intact with range of motion moderately diminished subtalar midtarsal joint left over right and mild equinus condition noted. Muscle strength was adequate and it was noted to be significant for foot structural malalignment with a left with large bunion deformity and deviation of the digits and a lateral direction. Patient does have mild diminishment of sharp Dull vibratory but intact     Assessment:     Problem is probably related more to other organic cause is even though left foot does have structural malalignment    Plan:     H&P and x-ray reviewed and today I spent a great deal of time going over with her considerations for balance exercises proprioception improvement to try to help with her balance issues. I reviewed different exercises that she can do

## 2014-07-03 DIAGNOSIS — J309 Allergic rhinitis, unspecified: Secondary | ICD-10-CM | POA: Diagnosis not present

## 2014-07-21 DIAGNOSIS — J309 Allergic rhinitis, unspecified: Secondary | ICD-10-CM | POA: Diagnosis not present

## 2014-08-01 DIAGNOSIS — J309 Allergic rhinitis, unspecified: Secondary | ICD-10-CM | POA: Diagnosis not present

## 2014-08-06 DIAGNOSIS — J309 Allergic rhinitis, unspecified: Secondary | ICD-10-CM | POA: Diagnosis not present

## 2014-08-15 DIAGNOSIS — J309 Allergic rhinitis, unspecified: Secondary | ICD-10-CM | POA: Diagnosis not present

## 2014-08-29 ENCOUNTER — Encounter: Payer: Self-pay | Admitting: Family Medicine

## 2014-08-29 ENCOUNTER — Ambulatory Visit (INDEPENDENT_AMBULATORY_CARE_PROVIDER_SITE_OTHER): Payer: Medicare Other | Admitting: Family Medicine

## 2014-08-29 VITALS — BP 150/92 | HR 80 | Temp 98.5°F | Ht 61.0 in | Wt 88.0 lb

## 2014-08-29 DIAGNOSIS — J0100 Acute maxillary sinusitis, unspecified: Secondary | ICD-10-CM

## 2014-08-29 MED ORDER — AZITHROMYCIN 250 MG PO TABS: ORAL_TABLET | ORAL | Status: DC

## 2014-08-29 NOTE — Progress Notes (Signed)
   Subjective:    Patient ID: Kristine Francis, female    DOB: [birth year], 73 y.o.   MRN: [id number]  HPI Here for 9 days of sinus pressure, PND, and coughing up green sputum. No fever. On Mucinex    Review of Systems  Constitutional: Negative.   HENT: Positive for congestion, postnasal drip and sinus pressure.   Eyes: Negative.   Respiratory: Positive for cough.        Objective:   Physical Exam  Constitutional: She appears well-developed and well-nourished.  HENT:  Right Ear: External ear normal.  Left Ear: External ear normal.  Nose: Nose normal.  Mouth/Throat: Oropharynx is clear and moist.  Eyes: Conjunctivae are normal.  Pulmonary/Chest: Effort normal and breath sounds normal.  Lymphadenopathy:    She has no cervical adenopathy.          Assessment & Plan:  Recheck prn

## 2014-08-29 NOTE — Progress Notes (Signed)
Pre visit review using our clinic review tool, if applicable. No additional management support is needed unless otherwise documented below in the visit note. 

## 2014-09-12 DIAGNOSIS — J301 Allergic rhinitis due to pollen: Secondary | ICD-10-CM | POA: Diagnosis not present

## 2014-09-12 DIAGNOSIS — J3081 Allergic rhinitis due to animal (cat) (dog) hair and dander: Secondary | ICD-10-CM | POA: Diagnosis not present

## 2014-09-12 DIAGNOSIS — J3089 Other allergic rhinitis: Secondary | ICD-10-CM | POA: Diagnosis not present

## 2014-09-15 ENCOUNTER — Ambulatory Visit (INDEPENDENT_AMBULATORY_CARE_PROVIDER_SITE_OTHER): Payer: Medicare Other

## 2014-09-15 DIAGNOSIS — Z23 Encounter for immunization: Secondary | ICD-10-CM | POA: Diagnosis not present

## 2014-09-15 DIAGNOSIS — J3081 Allergic rhinitis due to animal (cat) (dog) hair and dander: Secondary | ICD-10-CM | POA: Diagnosis not present

## 2014-09-15 DIAGNOSIS — J3089 Other allergic rhinitis: Secondary | ICD-10-CM | POA: Diagnosis not present

## 2014-09-15 DIAGNOSIS — J301 Allergic rhinitis due to pollen: Secondary | ICD-10-CM | POA: Diagnosis not present

## 2014-09-19 DIAGNOSIS — J3089 Other allergic rhinitis: Secondary | ICD-10-CM | POA: Diagnosis not present

## 2014-09-19 DIAGNOSIS — J3081 Allergic rhinitis due to animal (cat) (dog) hair and dander: Secondary | ICD-10-CM | POA: Diagnosis not present

## 2014-09-22 DIAGNOSIS — J3081 Allergic rhinitis due to animal (cat) (dog) hair and dander: Secondary | ICD-10-CM | POA: Diagnosis not present

## 2014-09-22 DIAGNOSIS — J3089 Other allergic rhinitis: Secondary | ICD-10-CM | POA: Diagnosis not present

## 2014-09-22 DIAGNOSIS — J301 Allergic rhinitis due to pollen: Secondary | ICD-10-CM | POA: Diagnosis not present

## 2014-09-26 DIAGNOSIS — J3081 Allergic rhinitis due to animal (cat) (dog) hair and dander: Secondary | ICD-10-CM | POA: Diagnosis not present

## 2014-09-26 DIAGNOSIS — J301 Allergic rhinitis due to pollen: Secondary | ICD-10-CM | POA: Diagnosis not present

## 2014-09-26 DIAGNOSIS — J3089 Other allergic rhinitis: Secondary | ICD-10-CM | POA: Diagnosis not present

## 2014-09-30 DIAGNOSIS — J3081 Allergic rhinitis due to animal (cat) (dog) hair and dander: Secondary | ICD-10-CM | POA: Diagnosis not present

## 2014-09-30 DIAGNOSIS — J301 Allergic rhinitis due to pollen: Secondary | ICD-10-CM | POA: Diagnosis not present

## 2014-09-30 DIAGNOSIS — J3089 Other allergic rhinitis: Secondary | ICD-10-CM | POA: Diagnosis not present

## 2014-10-06 DIAGNOSIS — J3081 Allergic rhinitis due to animal (cat) (dog) hair and dander: Secondary | ICD-10-CM | POA: Diagnosis not present

## 2014-10-06 DIAGNOSIS — J3089 Other allergic rhinitis: Secondary | ICD-10-CM | POA: Diagnosis not present

## 2014-10-06 DIAGNOSIS — J301 Allergic rhinitis due to pollen: Secondary | ICD-10-CM | POA: Diagnosis not present

## 2014-10-15 DIAGNOSIS — J3089 Other allergic rhinitis: Secondary | ICD-10-CM | POA: Diagnosis not present

## 2014-10-15 DIAGNOSIS — J301 Allergic rhinitis due to pollen: Secondary | ICD-10-CM | POA: Diagnosis not present

## 2014-10-15 DIAGNOSIS — J3081 Allergic rhinitis due to animal (cat) (dog) hair and dander: Secondary | ICD-10-CM | POA: Diagnosis not present

## 2014-10-24 DIAGNOSIS — J3081 Allergic rhinitis due to animal (cat) (dog) hair and dander: Secondary | ICD-10-CM | POA: Diagnosis not present

## 2014-10-24 DIAGNOSIS — J301 Allergic rhinitis due to pollen: Secondary | ICD-10-CM | POA: Diagnosis not present

## 2014-10-24 DIAGNOSIS — J3089 Other allergic rhinitis: Secondary | ICD-10-CM | POA: Diagnosis not present

## 2014-10-28 ENCOUNTER — Ambulatory Visit: Payer: Medicare Other | Admitting: Family

## 2014-10-28 DIAGNOSIS — J0100 Acute maxillary sinusitis, unspecified: Secondary | ICD-10-CM | POA: Diagnosis not present

## 2014-10-28 DIAGNOSIS — J3081 Allergic rhinitis due to animal (cat) (dog) hair and dander: Secondary | ICD-10-CM | POA: Diagnosis not present

## 2014-10-28 DIAGNOSIS — J3089 Other allergic rhinitis: Secondary | ICD-10-CM | POA: Diagnosis not present

## 2014-11-01 ENCOUNTER — Other Ambulatory Visit: Payer: Self-pay | Admitting: Internal Medicine

## 2014-11-10 ENCOUNTER — Encounter: Payer: Self-pay | Admitting: Family Medicine

## 2014-11-10 ENCOUNTER — Ambulatory Visit (INDEPENDENT_AMBULATORY_CARE_PROVIDER_SITE_OTHER): Payer: Medicare Other | Admitting: Family Medicine

## 2014-11-10 VITALS — BP 160/94 | HR 71 | Temp 97.8°F | Ht 61.0 in | Wt 87.0 lb

## 2014-11-10 DIAGNOSIS — N390 Urinary tract infection, site not specified: Secondary | ICD-10-CM | POA: Diagnosis not present

## 2014-11-10 DIAGNOSIS — J0100 Acute maxillary sinusitis, unspecified: Secondary | ICD-10-CM | POA: Diagnosis not present

## 2014-11-10 MED ORDER — AZITHROMYCIN 250 MG PO TABS: ORAL_TABLET | ORAL | Status: DC

## 2014-11-10 NOTE — Progress Notes (Signed)
Pre visit review using our clinic review tool, if applicable. No additional management support is needed unless otherwise documented below in the visit note. 

## 2014-11-10 NOTE — Progress Notes (Signed)
   Subjective:    Patient ID: Kristine Francis, female    DOB: May 22, 1941, 73 y.o.   MRN: [id number]  HPI Here for one week of sinus pressure, PND, and coughing up green sputum. She saw Dr. Orvil Feil on 10-28-14 for an allergy check up and had UTI sx, so she gave her a week of Levaquin. The urinary sx are resolved now.    Review of Systems  Constitutional: Negative.   HENT: Positive for congestion, postnasal drip and sinus pressure.   Eyes: Negative.   Respiratory: Positive for cough.   Genitourinary: Negative.        Objective:   Physical Exam  Constitutional: She appears well-developed and well-nourished.  HENT:  Right Ear: External ear normal.  Left Ear: External ear normal.  Nose: Nose normal.  Mouth/Throat: Oropharynx is clear and moist.  Eyes: Conjunctivae are normal.  Pulmonary/Chest: Effort normal and breath sounds normal.  Lymphadenopathy:    She has no cervical adenopathy.          Assessment & Plan:  Add Mucinex

## 2014-11-17 DIAGNOSIS — J3081 Allergic rhinitis due to animal (cat) (dog) hair and dander: Secondary | ICD-10-CM | POA: Diagnosis not present

## 2014-11-17 DIAGNOSIS — J301 Allergic rhinitis due to pollen: Secondary | ICD-10-CM | POA: Diagnosis not present

## 2014-11-17 DIAGNOSIS — J3089 Other allergic rhinitis: Secondary | ICD-10-CM | POA: Diagnosis not present

## 2014-12-02 DIAGNOSIS — J3081 Allergic rhinitis due to animal (cat) (dog) hair and dander: Secondary | ICD-10-CM | POA: Diagnosis not present

## 2014-12-02 DIAGNOSIS — J3089 Other allergic rhinitis: Secondary | ICD-10-CM | POA: Diagnosis not present

## 2014-12-02 DIAGNOSIS — J301 Allergic rhinitis due to pollen: Secondary | ICD-10-CM | POA: Diagnosis not present

## 2014-12-05 DIAGNOSIS — J301 Allergic rhinitis due to pollen: Secondary | ICD-10-CM | POA: Diagnosis not present

## 2014-12-05 DIAGNOSIS — J3081 Allergic rhinitis due to animal (cat) (dog) hair and dander: Secondary | ICD-10-CM | POA: Diagnosis not present

## 2014-12-05 DIAGNOSIS — J3089 Other allergic rhinitis: Secondary | ICD-10-CM | POA: Diagnosis not present

## 2014-12-08 DIAGNOSIS — J301 Allergic rhinitis due to pollen: Secondary | ICD-10-CM | POA: Diagnosis not present

## 2014-12-08 DIAGNOSIS — J3089 Other allergic rhinitis: Secondary | ICD-10-CM | POA: Diagnosis not present

## 2014-12-08 DIAGNOSIS — J3081 Allergic rhinitis due to animal (cat) (dog) hair and dander: Secondary | ICD-10-CM | POA: Diagnosis not present

## 2014-12-19 DIAGNOSIS — J3089 Other allergic rhinitis: Secondary | ICD-10-CM | POA: Diagnosis not present

## 2014-12-19 DIAGNOSIS — J3081 Allergic rhinitis due to animal (cat) (dog) hair and dander: Secondary | ICD-10-CM | POA: Diagnosis not present

## 2014-12-19 DIAGNOSIS — J301 Allergic rhinitis due to pollen: Secondary | ICD-10-CM | POA: Diagnosis not present

## 2014-12-26 DIAGNOSIS — J3081 Allergic rhinitis due to animal (cat) (dog) hair and dander: Secondary | ICD-10-CM | POA: Diagnosis not present

## 2014-12-26 DIAGNOSIS — J301 Allergic rhinitis due to pollen: Secondary | ICD-10-CM | POA: Diagnosis not present

## 2014-12-26 DIAGNOSIS — J3089 Other allergic rhinitis: Secondary | ICD-10-CM | POA: Diagnosis not present

## 2014-12-29 ENCOUNTER — Telehealth: Payer: Self-pay

## 2014-12-29 ENCOUNTER — Ambulatory Visit (INDEPENDENT_AMBULATORY_CARE_PROVIDER_SITE_OTHER): Payer: Medicare Other | Admitting: Family Medicine

## 2014-12-29 ENCOUNTER — Encounter: Payer: Self-pay | Admitting: Family Medicine

## 2014-12-29 VITALS — BP 150/88 | Temp 98.4°F | Ht 61.0 in | Wt 192.0 lb

## 2014-12-29 DIAGNOSIS — R079 Chest pain, unspecified: Secondary | ICD-10-CM

## 2014-12-29 NOTE — Progress Notes (Signed)
Pre visit review using our clinic review tool, if applicable. No additional management support is needed unless otherwise documented below in the visit note. 

## 2014-12-29 NOTE — Telephone Encounter (Signed)
[facility] Patient Name: Kristine Francis Gender: Female DOB: 07/18/1941 Age: 74 Y 87 M 22 D Return Phone Number: Address: City/State/Zip: [place] Client [place] [facility] - Client Client Site [place] - Day Phys[facility] [place], [place] Type Call Caller Name Nicie Milan Phone Number [phone] Relationship To Patient Self Is this call to report lab results? No Call Type General Information Initial Comment Caller states she has had pain in the middle of her chest in the middle of the night. Happened 1/28 or 1/29. Put aspirin under tongue, subsided. Same thing happened 2/2. No sx today. Just wants appt. General Information Type Appointment Nurse Assessment Guidelines Guideline Title Affirmed Question Affirmed Notes Nurse Date/Time (Eastern Time) Disp. Time Eilene Ghazi Time) Disposition Final User 12/29/2014 8:44:10 AM General Information Provided Yes Caryl Comes After Care Instructions Given Call Event Type User Date / Time Description

## 2014-12-29 NOTE — Progress Notes (Signed)
   Subjective:    Patient ID: Kristine Francis, female    DOB: [birth year], 74 y.o.   MRN: [id number]  HPI Here for 2 episodes of chest pain in the past 10 days, one occurred while lying in bed at night and the other while standing and talking to a friend. One lasted about 5 minutes and the other about 30 minutes. Both were fairly mild and neither radiated to the arms or jaws. No nausea or sweats or SOB. She takes Omeprazole daily for GERD and this has been well controlled. Today she feels fine.    Review of Systems  Constitutional: Negative.   Respiratory: Negative.   Cardiovascular: Positive for chest pain. Negative for palpitations and leg swelling.       Objective:   Physical Exam  Constitutional: She is oriented to person, place, and time. She appears well-developed and well-nourished. No distress.  Neck: No thyromegaly present.  Cardiovascular: Normal rate, regular rhythm, normal heart sounds and intact distal pulses.   No murmur heard. EKG normal   Pulmonary/Chest: Effort normal and breath sounds normal. She exhibits no tenderness.  Neurological: She is alert and oriented to person, place, and time.          Assessment & Plan:  These episodes are suspicious for possible angina. We will refer her to Cardiology in the next few days to evaluate. She will keep taking 81 mg of aspirin daily.

## 2015-01-12 ENCOUNTER — Other Ambulatory Visit: Payer: Self-pay | Admitting: Internal Medicine

## 2015-01-12 NOTE — Telephone Encounter (Signed)
Rx sent to [facility] for Omeprazole (PRILOSEC), 40 mg, #90, No refills. Take 1 tablet, by mouth, every day on 01/02/15.

## 2015-01-16 DIAGNOSIS — J3089 Other allergic rhinitis: Secondary | ICD-10-CM | POA: Diagnosis not present

## 2015-01-16 DIAGNOSIS — J3081 Allergic rhinitis due to animal (cat) (dog) hair and dander: Secondary | ICD-10-CM | POA: Diagnosis not present

## 2015-01-16 DIAGNOSIS — J301 Allergic rhinitis due to pollen: Secondary | ICD-10-CM | POA: Diagnosis not present

## 2015-01-30 ENCOUNTER — Ambulatory Visit: Payer: Medicare Other | Admitting: Cardiology

## 2015-02-03 DIAGNOSIS — J3089 Other allergic rhinitis: Secondary | ICD-10-CM | POA: Diagnosis not present

## 2015-02-03 DIAGNOSIS — J3081 Allergic rhinitis due to animal (cat) (dog) hair and dander: Secondary | ICD-10-CM | POA: Diagnosis not present

## 2015-02-03 DIAGNOSIS — J301 Allergic rhinitis due to pollen: Secondary | ICD-10-CM | POA: Diagnosis not present

## 2015-02-05 NOTE — Progress Notes (Signed)
Cardiology Office Note   Date:  02/06/2015   ID:  KENTRELL GUETTLER, DOB 08-13-1941, MRN [id number]  PCP:  [name], MD  Cardiologist:   Sueanne Margarita, MD   No chief complaint on file.     History of Present Illness: Kristine Francis is a 74 y.o. female who presents for evaluation of chest pain.  She has had several episodes.  Once occurred while lying in the bed and the other occurred while standing and talking to a friend.  They can last anywhere from 5 to 30 minutes.  It was midsternal in location and was described as a pressure.  There was no radiation to the arms or jaw, no SOB,  nausea or diaphoresis.  She has a history of GERD and takes omeprazole.  Her father had CAD with an MI at 26.  She has never smoked.  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  She does walk the dog 30 minutes daily.  She denies any DOE, LE edema, palpitations or syncope.    Past Medical History  Diagnosis Date  . Ataxia   . Emphysema   . Diverticulosis   . Renal cyst   . Osteopenia   . GERD (gastroesophageal reflux disease)   . Iron deficiency anemia, unspecified   . Kidney cysts 11/16/12    small cyst on left  . Gait disorder   . Nystagmus, end-position   . Gait abnormality 05/14/2013    Ms.Boehm a patient of Dr. Linda Hedges presented first interpreter thousand 13 with a gait dysfunction, she also had an episodic confusion and Loss device. Exam found a mild nystagmus and she was referred to ophthalmology on 03-2812, brain MRI was normal nystagmus was a secondary diagnosis to vestibulitis, ear nose and throat has followed and had seen the patient. At the time of the nystagmus the patie  . Hepatic steatosis   . Allergy   . Asthma     sees Dr. Tiajuana Amass     Past Surgical History  Procedure Laterality Date  . Tonsillectomy    . Breast biopsy Bilateral 1970's  . Cervical spine surgery  2010  . Hernia repair  Oct '14-left,  remote-right    years ago right; left done '14  . Cataract extraction Bilateral LT:02/14/13,RT:02/21/13  . Skin cancer excision  05/13/13    FACE  . Eye surgery Bilateral 01/2013&02/2013    left then right     Current Outpatient Prescriptions  Medication Sig Dispense Refill  . aspirin 81 MG tablet Take 81 mg by mouth daily.      Marland Kitchen azithromycin (ZITHROMAX) 250 MG tablet As directed (Patient not taking: Reported on 12/29/2014) 6 tablet 0  . b complex vitamins tablet Take 1 tablet by mouth daily.    Marland Kitchen CALCIUM CARBONATE PO Take 1,200 mg by mouth. SLOW RELEASE , 2 TABLETS 1 TIME DAILY    . CALCIUM-MAGNESIUM-ZINC PO Take 1 tablet by mouth 3 (three) times daily.    . Cholecalciferol (VITAMIN D PO) Take 1 capsule by mouth daily.    Marland Kitchen doxycycline (VIBRAMYCIN) 100 MG capsule Take 1 capsule (100 mg total) by mouth 2 (two) times daily. (Patient not taking: Reported on 11/10/2014) 20 capsule 0  . Ginkgo Biloba 100 MG CAPS Take 2 capsules by mouth daily.    . Glucosamine Sulfate 1000 MG TABS Take 1 tablet by mouth 2 (two) times daily.    Marland Kitchen Lysine  500 MG TABS Take by mouth daily.    . Multiple Vitamins-Minerals (CENTRUM SILVER PO) Take 1 capsule by mouth daily.      Marland Kitchen omeprazole (PRILOSEC) 40 MG capsule TAKE 1 CAPSULE DAILY 90 capsule 0  . vitamin E 600 UNIT capsule Take 600 Units by mouth daily.       No current facility-administered medications for this visit.    Allergies:   Contrast media and Iohexol    Social History:  The patient  reports that she has never smoked. She has never used smokeless tobacco. She reports that she does not drink alcohol or use illicit drugs.   Family History:  The patient's family history includes Breast cancer in her paternal aunt; Cancer in her father, maternal aunt, mother, paternal aunt, paternal aunt, paternal aunt, and paternal grandmother; Cancer - Lung in her paternal aunt; Colon cancer in her father, paternal aunt, and paternal grandmother; Coronary artery disease  in her father; Heart attack in her other; Heart block in her father; Ovarian cancer in her maternal aunt; Pancreatic cancer in her mother.    ROS:  Please see the history of present illness.   Otherwise, review of systems are positive for none.   All other systems are reviewed and negative.    PHYSICAL EXAM: VS:  There were no vitals taken for this visit. , BMI There is no weight on file to calculate BMI. GEN: Well nourished, well developed, in no acute distress HEENT: normal Neck: no JVD, carotid bruits, or masses Cardiac: RRR; no murmurs, rubs, or gallops,no edema  Respiratory:  clear to auscultation bilaterally, normal work of breathing GI: soft, nontender, nondistended, + BS MS: no deformity or atrophy Skin: warm and dry, no rash Neuro:  Strength and sensation are intact Psych: euthymic mood, full affect   EKG:  EKG is not ordered today. The prior ekg 12/2014 showed NSR with nonspecific ST abnormality   Recent Labs: No results found for requested labs within last 365 days.    Lipid Panel    Component Value Date/Time   CHOL 217* 01/29/2014 1158   TRIG 46.0 01/29/2014 1158   HDL 99.70 01/29/2014 1158   CHOLHDL 2 01/29/2014 1158   VLDL 9.2 01/29/2014 1158   LDLCALC 108* 01/29/2014 1158   LDLDIRECT 129.3 12/07/2010 1110      Wt Readings from Last 3 Encounters:  12/29/14 192 lb (87.091 kg)  11/10/14 87 lb (39.463 kg)  08/29/14 88 lb (39.917 kg)      ASSESSMENT AND PLAN:  1.  Chest pain with typical and atypical symptoms.  Her CRF include post menopausal, family history of CAD.  She does not smoke.  I will get a stress myoview to rule out ischemia and 2D echo to assess LVF.   2.  GERD - continue PPI 3.  Elevated BP   Current medicines are reviewed at length with the patient today.  The patient does not have concerns regarding medicines.  The following changes have been made:  no change  Labs/ tests ordered today include: Stress myoview, 2D echo  No orders of  the defined types were placed in this encounter.     Disposition:   FU with me PRN pending results of studies   SignedSueanne Margarita, MD  02/06/2015 11:50 AM    [facility] [place], [place], [place]  [address] Phone: [phone]; Fax: [phone]

## 2015-02-06 ENCOUNTER — Ambulatory Visit (INDEPENDENT_AMBULATORY_CARE_PROVIDER_SITE_OTHER): Payer: Medicare Other | Admitting: Cardiology

## 2015-02-06 VITALS — BP 155/95 | HR 72 | Ht 61.0 in | Wt 89.0 lb

## 2015-02-06 DIAGNOSIS — K219 Gastro-esophageal reflux disease without esophagitis: Secondary | ICD-10-CM

## 2015-02-06 DIAGNOSIS — IMO0001 Reserved for inherently not codable concepts without codable children: Secondary | ICD-10-CM

## 2015-02-06 DIAGNOSIS — R079 Chest pain, unspecified: Secondary | ICD-10-CM

## 2015-02-06 DIAGNOSIS — R030 Elevated blood-pressure reading, without diagnosis of hypertension: Secondary | ICD-10-CM | POA: Diagnosis not present

## 2015-02-06 NOTE — Patient Instructions (Signed)
Your physician has requested that you have an echocardiogram. Echocardiography is a painless test that uses sound waves to create images of your heart. It provides your doctor with information about the size and shape of your heart and how well your heart's chambers and valves are working. This procedure takes approximately one hour. There are no restrictions for this procedure.  Dr. Radford Pax recommends you have a NUCLEAR STRESS TEST.  Your physician recommends that you schedule a follow-up appointment AS NEEDED with Dr. Radford Pax pending the results of your tests.

## 2015-02-11 ENCOUNTER — Encounter: Payer: Self-pay | Admitting: Internal Medicine

## 2015-02-11 ENCOUNTER — Ambulatory Visit (INDEPENDENT_AMBULATORY_CARE_PROVIDER_SITE_OTHER): Payer: Medicare Other | Admitting: Internal Medicine

## 2015-02-11 VITALS — BP 120/78 | HR 86 | Temp 98.5°F | Resp 16 | Ht 61.0 in | Wt 87.8 lb

## 2015-02-11 DIAGNOSIS — N3000 Acute cystitis without hematuria: Secondary | ICD-10-CM | POA: Diagnosis not present

## 2015-02-11 DIAGNOSIS — R300 Dysuria: Secondary | ICD-10-CM

## 2015-02-11 LAB — POCT URINALYSIS DIPSTICK
Bilirubin, UA: NEGATIVE
Glucose, UA: NEGATIVE
Ketones, UA: NEGATIVE
Nitrite, UA: NEGATIVE
Protein, UA: 30
Spec Grav, UA: 1.025
Urobilinogen, UA: 0.2
pH, UA: 5

## 2015-02-11 MED ORDER — LEVOFLOXACIN 500 MG PO TABS: 500.0000 mg | ORAL_TABLET | Freq: Every day | ORAL | Status: DC

## 2015-02-11 NOTE — Patient Instructions (Signed)

## 2015-02-11 NOTE — Assessment & Plan Note (Signed)
Her UA is abnormal This is c/w a UTI Will treat with levaquin

## 2015-02-11 NOTE — Assessment & Plan Note (Signed)
Will check a urine clx Will treat empirically with levaquin

## 2015-02-11 NOTE — Progress Notes (Signed)
Subjective:    Patient ID: Kristine Francis, female    DOB: August 28, 1941, 74 y.o.   MRN: [id number]  Dysuria  This is a new problem. The current episode started yesterday. The problem occurs every urination. The problem has been unchanged. The quality of the pain is described as burning. The pain is at a severity of 1/10. The patient is experiencing no pain. There has been no fever. The fever has been present for less than 1 day. She is not sexually active. There is a history of pyelonephritis. Associated symptoms include frequency and urgency. Pertinent negatives include no chills, discharge, flank pain, hematuria, hesitancy, nausea, sweats or vomiting. She has tried nothing for the symptoms. Her past medical history is significant for recurrent UTIs and urinary stasis. There is no history of catheterization, kidney stones, a single kidney or a urological procedure.      Review of Systems  Constitutional: Negative.  Negative for fever, chills, diaphoresis, appetite change and fatigue.  HENT: Negative.   Eyes: Negative.   Respiratory: Negative.  Negative for cough, choking, chest tightness, shortness of breath and stridor.   Cardiovascular: Negative.  Negative for chest pain, palpitations and leg swelling.  Gastrointestinal: Negative.  Negative for nausea, vomiting and abdominal pain.  Endocrine: Negative.   Genitourinary: Positive for dysuria, urgency and frequency. Negative for hesitancy, hematuria, flank pain, decreased urine volume, vaginal bleeding and difficulty urinating.  Musculoskeletal: Negative.   Skin: Negative.   Allergic/Immunologic: Negative.   Neurological: Negative.   Hematological: Negative.  Negative for adenopathy. Does not bruise/bleed easily.  Psychiatric/Behavioral: Negative.        Objective:   Physical Exam  Constitutional: She is oriented to person, place, and time. She appears well-developed and well-nourished.  Non-toxic appearance. She does not have a sickly  appearance. She does not appear ill. No distress.  HENT:  Head: Normocephalic and atraumatic.  Mouth/Throat: Oropharynx is clear and moist. No oropharyngeal exudate.  Eyes: Conjunctivae are normal. Right eye exhibits no discharge. Left eye exhibits no discharge. No scleral icterus.  Neck: Normal range of motion. Neck supple. No JVD present. No tracheal deviation present. No thyromegaly present.  Cardiovascular: Normal rate, regular rhythm, normal heart sounds and intact distal pulses.  Exam reveals no gallop and no friction rub.   No murmur heard. Pulmonary/Chest: Effort normal and breath sounds normal. No stridor. No respiratory distress. She has no wheezes. She has no rales. She exhibits no tenderness.  Abdominal: Soft. Bowel sounds are normal. She exhibits no distension and no mass. There is no hepatosplenomegaly. There is no tenderness. There is no rebound, no guarding and no CVA tenderness.  Musculoskeletal: Normal range of motion. She exhibits no edema or tenderness.  Lymphadenopathy:    She has no cervical adenopathy.  Neurological: She is oriented to person, place, and time.  Skin: Skin is warm and dry. No rash noted. She is not diaphoretic. No erythema. No pallor.  Psychiatric: She has a normal mood and affect. Her behavior is normal. Judgment and thought content normal.  Vitals reviewed.    Results for HILDY, NICHOLL (MRN [id number]) as of 02/11/2015 15:13  Ref. Range 02/11/2015 15:04  Color, UA No range found dark yellow  Specific Gravity, UA No range found 1.025  pH, UA No range found 5.0  Glucose Latest Range: NEGATIVE mg/dL negative  Bilirubin, UA No range found negative  Ketones, UA No range found negative  Clarity, UA No range found cloudy  Protein, UA No range found  30  Urobilinogen, UA Latest Range: 0.0-1.0 mg/dL 0.2  Nitrite, UA No range found negative  Leukocytes, UA No range found moderate (2+)  RBC, UA No range found large       Assessment & Plan:

## 2015-02-11 NOTE — Progress Notes (Signed)
Pre visit review using our clinic review tool, if applicable. No additional management support is needed unless otherwise documented below in the visit note. 

## 2015-02-16 DIAGNOSIS — J301 Allergic rhinitis due to pollen: Secondary | ICD-10-CM | POA: Diagnosis not present

## 2015-02-20 ENCOUNTER — Ambulatory Visit (HOSPITAL_COMMUNITY): Payer: Medicare Other | Attending: Cardiology | Admitting: Radiology

## 2015-02-20 ENCOUNTER — Ambulatory Visit (HOSPITAL_BASED_OUTPATIENT_CLINIC_OR_DEPARTMENT_OTHER): Payer: Medicare Other | Admitting: Radiology

## 2015-02-20 DIAGNOSIS — R079 Chest pain, unspecified: Secondary | ICD-10-CM | POA: Diagnosis not present

## 2015-02-20 MED ORDER — TECHNETIUM TC 99M SESTAMIBI GENERIC - CARDIOLITE
33.0000 | Freq: Once | INTRAVENOUS | Status: AC | PRN
  Administered 2015-02-20: 33 via INTRAVENOUS

## 2015-02-20 MED ORDER — REGADENOSON 0.4 MG/5ML IV SOLN
0.4000 mg | Freq: Once | INTRAVENOUS | Status: AC
  Administered 2015-02-20: 0.4 mg via INTRAVENOUS

## 2015-02-20 MED ORDER — TECHNETIUM TC 99M SESTAMIBI GENERIC - CARDIOLITE
11.0000 | Freq: Once | INTRAVENOUS | Status: AC | PRN
  Administered 2015-02-20: 11 via INTRAVENOUS

## 2015-02-20 NOTE — Progress Notes (Signed)
Echocardiogram performed.  

## 2015-02-20 NOTE — Progress Notes (Signed)
[facility] [address] [place], [place] [address] [phone]    Cardiology Nuclear Med Study  Kristine Francis is a 74 y.o. female     MRN : [id number]     DOB: 01-29-1941  Procedure Date: 02/20/2015  Nuclear Med Background Indication for Stress Test:  Evaluation for Ischemia History:  No known CAD, Asthma Cardiac Risk Factors: None  Symptoms:  Chest Pain   Nuclear Pre-Procedure Caffeine/Decaff Intake:  None> 12 hrs NPO After: 7:00pm   Lungs:  clear O2 Sat: 98% on room air. IV 0.9% NS with Angio Cath:  22g  IV Site: R Antecubital x 1, tolerated well IV Started by:  Irven Baltimore, RN  Chest Size (in):  34 Cup Size: B  Height: 5' (1.524 m)  Weight:  86 lb (39.009 kg)  BMI:  Body mass index is 16.8 kg/(m^2). Tech Comments:  N/A    Nuclear Med Study 1 or 2 day study: 1 day  Stress Test Type:  Lexiscan  Reading MD: N/A  Order Authorizing Provider:  Fransico Him, MD  Resting Radionuclide: Technetium 75m Sestamibi  Resting Radionuclide Dose: 11.0 mCi   Stress Radionuclide:  Technetium 16m Sestamibi  Stress Radionuclide Dose: 33.0 mCi           Stress Protocol Rest HR: 64 Stress HR: 80  Rest BP: 191/107 Stress BP: 89/55  Exercise Time (min): n/a METS: n/a   Predicted Max HR: 148 bpm % Max HR: 54.05 bpm Rate Pressure Product: 7120   Dose of Adenosine (mg):  n/a Dose of Lexiscan: 0.4 mg  Dose of Atropine (mg): n/a Dose of Dobutamine: n/a mcg/kg/min (at max HR)  Stress Test Technologist: Glade Lloyd, BS-ES  Nuclear Technologist:  Earl Many, CNMT     Rest Procedure:  Myocardial perfusion imaging was performed at rest 45 minutes following the intravenous administration of Technetium 8m Sestamibi. Rest ECG: NSR - Normal EKG  Stress Procedure:  The patient received IV Lexiscan 0.4 mg over 15-seconds.  Technetium 87m Sestamibi injected at 30-seconds.  Quantitative spect images were obtained after a 45 minute delay.  During the infusion  of Lexiscan the patient complained of a warm sensation and dizziness that resolved in recovery.   Stress ECG: The patient developed TWI in the lateral leads with the Lexiscan infusion.   QPS Raw Data Images:  Normal; no motion artifact; normal heart/lung ratio. Stress Images:  Normal homogeneous uptake in all areas of the myocardium. Rest Images:  Normal homogeneous uptake in all areas of the myocardium. Subtraction (SDS):  No evidence of ischemia. Transient Ischemic Dilatation (Normal <1.22):  1.08 Lung/Heart Ratio (Normal <0.45):  0.24  Quantitative Gated Spect Images QGS EDV:  80 ml QGS ESV:  38 ml  Impression Exercise Capacity:  Lexiscan with no exercise. BP Response:  Normal blood pressure response. Clinical Symptoms:  No significant symptoms noted. ECG Impression:  She developed mild nonspecific TWI in the lateral leads  Comparison with Prior Nuclear Study: No images to compare  Overall Impression:  Normal stress nuclear study.  No evidence of ischemia.  Normal wall motion.    LV Ejection Fraction: 53%.  LV Wall Motion:  NL LV Function; NL Wall Motion.   [name], [name], MD, [facility] 02/20/2015, 4:35 PM [address]  [place] Pager [phone]

## 2015-02-27 DIAGNOSIS — J3081 Allergic rhinitis due to animal (cat) (dog) hair and dander: Secondary | ICD-10-CM | POA: Diagnosis not present

## 2015-02-27 DIAGNOSIS — J301 Allergic rhinitis due to pollen: Secondary | ICD-10-CM | POA: Diagnosis not present

## 2015-02-27 DIAGNOSIS — J3089 Other allergic rhinitis: Secondary | ICD-10-CM | POA: Diagnosis not present

## 2015-03-05 ENCOUNTER — Encounter: Payer: Self-pay | Admitting: Family Medicine

## 2015-03-05 ENCOUNTER — Ambulatory Visit (INDEPENDENT_AMBULATORY_CARE_PROVIDER_SITE_OTHER): Payer: Medicare Other | Admitting: Family Medicine

## 2015-03-05 VITALS — BP 160/84 | HR 86 | Temp 98.2°F | Ht 59.75 in | Wt 88.2 lb

## 2015-03-05 DIAGNOSIS — R030 Elevated blood-pressure reading, without diagnosis of hypertension: Secondary | ICD-10-CM | POA: Diagnosis not present

## 2015-03-05 DIAGNOSIS — R300 Dysuria: Secondary | ICD-10-CM

## 2015-03-05 DIAGNOSIS — IMO0001 Reserved for inherently not codable concepts without codable children: Secondary | ICD-10-CM

## 2015-03-05 DIAGNOSIS — J301 Allergic rhinitis due to pollen: Secondary | ICD-10-CM | POA: Diagnosis not present

## 2015-03-05 LAB — POCT URINALYSIS DIPSTICK
Bilirubin, UA: NEGATIVE
Glucose, UA: NEGATIVE
Ketones, UA: NEGATIVE
Nitrite, UA: NEGATIVE
Protein, UA: NEGATIVE
Spec Grav, UA: 1.015
Urobilinogen, UA: 0.2
pH, UA: 7

## 2015-03-05 MED ORDER — CEPHALEXIN 500 MG PO CAPS: 500.0000 mg | ORAL_CAPSULE | Freq: Two times a day (BID) | ORAL | Status: DC

## 2015-03-05 NOTE — Progress Notes (Signed)
HPI:  Dysuria: -hx recurrent UTIs, reports eval with urology in the past -had UTI in march treated with levoquin -started having burning with urination, urinary urgency and frequecy -denies: fevers, nausea, vomiting  ROS: See pertinent positives and negatives per HPI.  Past Medical History  Diagnosis Date  . Ataxia   . Emphysema   . Diverticulosis   . Renal cyst   . Osteopenia   . GERD (gastroesophageal reflux disease)   . Iron deficiency anemia, unspecified   . Kidney cysts 11/16/12    small cyst on left  . Gait disorder   . Nystagmus, end-position   . Gait abnormality 05/14/2013    Kristine Francis a patient of Dr. Linda Hedges presented first interpreter thousand 13 with a gait dysfunction, she also had an episodic confusion and Loss device. Exam found a mild nystagmus and she was referred to ophthalmology on 03-2812, brain MRI was normal nystagmus was a secondary diagnosis to vestibulitis, ear nose and throat has followed and had seen the patient. At the time of the nystagmus the patie  . Hepatic steatosis   . Allergy   . Asthma     sees Dr. Tiajuana Amass     Past Surgical History  Procedure Laterality Date  . Tonsillectomy    . Breast biopsy Bilateral 1970's  . Cervical spine surgery  2010  . Hernia repair  Oct '14-left, remote-right    years ago right; left done '14  . Cataract extraction Bilateral LT:02/14/13,RT:02/21/13  . Skin cancer excision  05/13/13    FACE  . Eye surgery Bilateral 01/2013&02/2013    left then right    Family History  Problem Relation Age of Onset  . Colon cancer Father   . Coronary artery disease Father   . Cancer Father     Colon  . Heart block Father   . Pancreatic cancer Mother   . Cancer Mother     Pancreatic  . Colon cancer Paternal Grandmother   . Cancer Paternal Grandmother     Colon  . Colon cancer Paternal Aunt   . Cancer - Lung Paternal Aunt   . Cancer Paternal Aunt     Breast  . Breast cancer Paternal Aunt   . Cancer Paternal Aunt     Colon  . Ovarian cancer Maternal Aunt   . Cancer Maternal Aunt     Ovarian  . Heart attack Other   . Cancer Paternal Aunt     Colon    History   Social History  . Marital Status: Married    Spouse Name: N/A  . Number of Children: 3  . Years of Education: 16   Occupational History  . 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     Social History Main Topics  . Smoking status: Never Smoker   . Smokeless tobacco: Never Used  . Alcohol Use: No  . Drug Use: No  . Sexual Activity:    Partners: Male   Other Topics Concern  . 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 married - 1965. 3 sons - '66, '69, '70. Sons in good health. Marriage in good health. Enjoys retirement - remains active.           Current outpatient prescriptions:  .  aspirin 81 MG tablet, Take 81 mg by mouth daily.  , Disp: , Rfl:  .  b complex vitamins tablet, Take 1 tablet by mouth  daily., Disp: , Rfl:  .  CALCIUM-MAGNESIUM-ZINC PO, Take 1 tablet by mouth 3 (three) times daily., Disp: , Rfl:  .  Cholecalciferol (VITAMIN D PO), Take 1 capsule by mouth daily., Disp: , Rfl:  .  Ginkgo Biloba 100 MG CAPS, Take 2 capsules by mouth daily., Disp: , Rfl:  .  Glucosamine Sulfate 1000 MG TABS, Take 1 tablet by mouth 2 (two) times daily., Disp: , Rfl:  .  Lysine 500 MG TABS, Take by mouth daily., Disp: , Rfl:  .  Multiple Vitamins-Minerals (CENTRUM SILVER PO), Take 1 capsule by mouth daily.  , Disp: , Rfl:  .  omeprazole (PRILOSEC) 40 MG capsule, TAKE 1 CAPSULE DAILY, Disp: 90 capsule, Rfl: 0 .  vitamin E 600 UNIT capsule, Take 600 Units by mouth daily.  , Disp: , Rfl:  .  cephALEXin (KEFLEX) 500 MG capsule, Take 1 capsule (500 mg total) by mouth 2 (two) times daily., Disp: 14 capsule, Rfl: 0  EXAM:  Filed Vitals:   03/05/15 1444  BP: 160/84  Pulse: 86  Temp: 98.2 F (36.8 C)    Body mass index is 17.36 kg/(m^2).  GENERAL: vitals reviewed and listed  above, alert, oriented, appears well hydrated and in no acute distress  HEENT: atraumatic, conjunttiva clear, no obvious abnormalities on inspection of external nose and ears  NECK: no obvious masses on inspection  LUNGS: clear to auscultation bilaterally, no wheezes, rales or rhonchi, good air movement  CV: HRRR, no peripheral edema  MS: moves all extremities without noticeable abnormality  PSYCH: pleasant and cooperative, no obvious depression or anxiety  ASSESSMENT AND PLAN:  Discussed the following assessment and plan:  Dysuria - Plan: POC Urinalysis Dipstick, cephALEXin (KEFLEX) 500 MG capsule, Culture, Urine  Elevated BP  -udip with leuks and blood, discussed options for empiric tx possible UTI and she opted for keflex - urine culture pending, return precuations -follow up appointment with PCP for BP to recheck and treat as needed - had assistant call to advise her of this after appt - appears has had elevated BP at several visits -Patient advised to return or notify a doctor immediately if symptoms worsen or persist or new concerns arise.  There are no Patient Instructions on file for this visit.   Colin Benton R.

## 2015-03-05 NOTE — Progress Notes (Signed)
Pre visit review using our clinic review tool, if applicable. No additional management support is needed unless otherwise documented below in the visit note. 

## 2015-03-06 ENCOUNTER — Telehealth: Payer: Self-pay | Admitting: *Deleted

## 2015-03-06 NOTE — Telephone Encounter (Signed)
Per Dr Maudie Mercury I called the pt and advised her she should see Dr Sarajane Jews in a month or two to follow up on her blood pressure as this was elevated at her visit yesterday.  She stated she will check her schedule and call back.

## 2015-03-07 LAB — URINE CULTURE
Colony Count: NO GROWTH
Organism ID, Bacteria: NO GROWTH

## 2015-03-20 DIAGNOSIS — J3081 Allergic rhinitis due to animal (cat) (dog) hair and dander: Secondary | ICD-10-CM | POA: Diagnosis not present

## 2015-03-20 DIAGNOSIS — J3089 Other allergic rhinitis: Secondary | ICD-10-CM | POA: Diagnosis not present

## 2015-03-20 DIAGNOSIS — J301 Allergic rhinitis due to pollen: Secondary | ICD-10-CM | POA: Diagnosis not present

## 2015-03-27 DIAGNOSIS — J301 Allergic rhinitis due to pollen: Secondary | ICD-10-CM | POA: Diagnosis not present

## 2015-03-27 DIAGNOSIS — J3081 Allergic rhinitis due to animal (cat) (dog) hair and dander: Secondary | ICD-10-CM | POA: Diagnosis not present

## 2015-03-27 DIAGNOSIS — J3089 Other allergic rhinitis: Secondary | ICD-10-CM | POA: Diagnosis not present

## 2015-03-30 DIAGNOSIS — J3089 Other allergic rhinitis: Secondary | ICD-10-CM | POA: Diagnosis not present

## 2015-03-30 DIAGNOSIS — J301 Allergic rhinitis due to pollen: Secondary | ICD-10-CM | POA: Diagnosis not present

## 2015-04-06 DIAGNOSIS — H52203 Unspecified astigmatism, bilateral: Secondary | ICD-10-CM | POA: Diagnosis not present

## 2015-04-06 DIAGNOSIS — Z961 Presence of intraocular lens: Secondary | ICD-10-CM | POA: Diagnosis not present

## 2015-04-11 ENCOUNTER — Other Ambulatory Visit: Payer: Self-pay | Admitting: Internal Medicine

## 2015-04-14 DIAGNOSIS — J3089 Other allergic rhinitis: Secondary | ICD-10-CM | POA: Diagnosis not present

## 2015-04-14 DIAGNOSIS — J301 Allergic rhinitis due to pollen: Secondary | ICD-10-CM | POA: Diagnosis not present

## 2015-04-17 DIAGNOSIS — Z1231 Encounter for screening mammogram for malignant neoplasm of breast: Secondary | ICD-10-CM | POA: Diagnosis not present

## 2015-04-24 ENCOUNTER — Ambulatory Visit (INDEPENDENT_AMBULATORY_CARE_PROVIDER_SITE_OTHER): Payer: Medicare Other | Admitting: Family Medicine

## 2015-04-24 ENCOUNTER — Encounter: Payer: Self-pay | Admitting: Family Medicine

## 2015-04-24 VITALS — BP 133/83 | HR 79 | Temp 98.5°F | Ht 59.75 in | Wt 88.0 lb

## 2015-04-24 DIAGNOSIS — J3089 Other allergic rhinitis: Secondary | ICD-10-CM | POA: Diagnosis not present

## 2015-04-24 DIAGNOSIS — R030 Elevated blood-pressure reading, without diagnosis of hypertension: Secondary | ICD-10-CM | POA: Diagnosis not present

## 2015-04-24 DIAGNOSIS — J3081 Allergic rhinitis due to animal (cat) (dog) hair and dander: Secondary | ICD-10-CM | POA: Diagnosis not present

## 2015-04-24 DIAGNOSIS — J4520 Mild intermittent asthma, uncomplicated: Secondary | ICD-10-CM | POA: Diagnosis not present

## 2015-04-24 DIAGNOSIS — K219 Gastro-esophageal reflux disease without esophagitis: Secondary | ICD-10-CM

## 2015-04-24 DIAGNOSIS — IMO0001 Reserved for inherently not codable concepts without codable children: Secondary | ICD-10-CM

## 2015-04-24 DIAGNOSIS — E785 Hyperlipidemia, unspecified: Secondary | ICD-10-CM | POA: Diagnosis not present

## 2015-04-24 DIAGNOSIS — R269 Unspecified abnormalities of gait and mobility: Secondary | ICD-10-CM

## 2015-04-24 DIAGNOSIS — J301 Allergic rhinitis due to pollen: Secondary | ICD-10-CM | POA: Diagnosis not present

## 2015-04-24 DIAGNOSIS — J438 Other emphysema: Secondary | ICD-10-CM

## 2015-04-24 LAB — HEPATIC FUNCTION PANEL
ALT: 19 U/L (ref 0–35)
AST: 23 U/L (ref 0–37)
Albumin: 3.8 g/dL (ref 3.5–5.2)
Alkaline Phosphatase: 76 U/L (ref 39–117)
Bilirubin, Direct: 0.2 mg/dL (ref 0.0–0.3)
Total Bilirubin: 0.9 mg/dL (ref 0.2–1.2)
Total Protein: 7.2 g/dL (ref 6.0–8.3)

## 2015-04-24 LAB — CBC WITH DIFFERENTIAL/PLATELET
Basophils Absolute: 0 10*3/uL (ref 0.0–0.1)
Basophils Relative: 0.4 % (ref 0.0–3.0)
Eosinophils Absolute: 0.3 10*3/uL (ref 0.0–0.7)
Eosinophils Relative: 3.3 % (ref 0.0–5.0)
HCT: 36.2 % (ref 36.0–46.0)
Hemoglobin: 11.9 g/dL — ABNORMAL LOW (ref 12.0–15.0)
Lymphocytes Relative: 16.3 % (ref 12.0–46.0)
Lymphs Abs: 1.6 10*3/uL (ref 0.7–4.0)
MCHC: 32.9 g/dL (ref 30.0–36.0)
MCV: 87.7 fl (ref 78.0–100.0)
Monocytes Absolute: 1 10*3/uL (ref 0.1–1.0)
Monocytes Relative: 9.8 % (ref 3.0–12.0)
Neutro Abs: 6.9 10*3/uL (ref 1.4–7.7)
Neutrophils Relative %: 70.2 % (ref 43.0–77.0)
Platelets: 419 10*3/uL — ABNORMAL HIGH (ref 150.0–400.0)
RBC: 4.13 Mil/uL (ref 3.87–5.11)
RDW: 14 % (ref 11.5–15.5)
WBC: 9.8 10*3/uL (ref 4.0–10.5)

## 2015-04-24 LAB — TSH: TSH: 1.3 u[IU]/mL (ref 0.35–4.50)

## 2015-04-24 LAB — BASIC METABOLIC PANEL
BUN: 13 mg/dL (ref 6–23)
CO2: 33 mEq/L — ABNORMAL HIGH (ref 19–32)
Calcium: 9.6 mg/dL (ref 8.4–10.5)
Chloride: 100 mEq/L (ref 96–112)
Creatinine, Ser: 0.59 mg/dL (ref 0.40–1.20)
GFR: 106.2 mL/min (ref >60.00)
Glucose, Bld: 93 mg/dL (ref 70–99)
Potassium: 4.4 mEq/L (ref 3.5–5.1)
Sodium: 137 mEq/L (ref 135–145)

## 2015-04-24 LAB — POCT URINALYSIS DIPSTICK
Bilirubin, UA: NEGATIVE
Blood, UA: NEGATIVE
Glucose, UA: NEGATIVE
Ketones, UA: NEGATIVE
Leukocytes, UA: NEGATIVE
Nitrite, UA: NEGATIVE
Protein, UA: NEGATIVE
Spec Grav, UA: 1.015
Urobilinogen, UA: 0.2
pH, UA: 7

## 2015-04-24 LAB — LIPID PANEL
Cholesterol: 172 mg/dL (ref 0–200)
HDL: 79 mg/dL (ref >39.00)
LDL Cholesterol: 83 mg/dL (ref 0–99)
NonHDL: 93
Total CHOL/HDL Ratio: 2
Triglycerides: 49 mg/dL (ref 0.0–149.0)
VLDL: 9.8 mg/dL (ref 0.0–40.0)

## 2015-04-24 NOTE — Progress Notes (Signed)
   Subjective:    Patient ID: Kristine Francis, female    DOB: 07-05-1941, 74 y.o.   MRN: [id number]  HPI Here for follow up on several things. Her BP is stable. She feels well although she still deals with her chronic gait ataxia. She controls her GERD with apple cider vinegar and she asks if she can stop there Omeprazole. She had a colonoscopy last year.    Review of Systems  Constitutional: Negative.   HENT: Negative.   Eyes: Negative.   Respiratory: Negative.   Cardiovascular: Negative.   Gastrointestinal: Negative.   Genitourinary: Negative for dysuria, urgency, frequency, hematuria, flank pain, decreased urine volume, enuresis, difficulty urinating, pelvic pain and dyspareunia.  Musculoskeletal: Negative.   Skin: Negative.   Neurological: Negative.   Psychiatric/Behavioral: Negative.        Objective:   Physical Exam  Constitutional: She is oriented to person, place, and time. She appears well-developed and well-nourished. No distress.  HENT:  Head: Normocephalic and atraumatic.  Right Ear: External ear normal.  Left Ear: External ear normal.  Nose: Nose normal.  Mouth/Throat: Oropharynx is clear and moist. No oropharyngeal exudate.  Eyes: Conjunctivae and EOM are normal. Pupils are equal, round, and reactive to light. No scleral icterus.  Neck: Normal range of motion. Neck supple. No JVD present. No thyromegaly present.  Cardiovascular: Normal rate, regular rhythm, normal heart sounds and intact distal pulses.  Exam reveals no gallop and no friction rub.   No murmur heard. Pulmonary/Chest: Effort normal and breath sounds normal. No respiratory distress. She has no wheezes. She has no rales. She exhibits no tenderness.  Abdominal: Soft. Bowel sounds are normal. She exhibits no distension and no mass. There is no tenderness. There is no rebound and no guarding.  Musculoskeletal: Normal range of motion. She exhibits no edema or tenderness.  Lymphadenopathy:    She has no  cervical adenopathy.  Neurological: She is alert and oriented to person, place, and time. She has normal reflexes. No cranial nerve deficit. She exhibits normal muscle tone. Coordination normal.  Skin: Skin is warm and dry. No rash noted. No erythema.  Psychiatric: She has a normal mood and affect. Her behavior is normal. Judgment and thought content normal.          Assessment & Plan:  Her BP is stable. Her GERD is stable so she will stop the Omeprazole. Her asthma is stable. Get fasting labs

## 2015-04-24 NOTE — Progress Notes (Signed)
Pre visit review using our clinic review tool, if applicable. No additional management support is needed unless otherwise documented below in the visit note. 

## 2015-05-13 DIAGNOSIS — J3089 Other allergic rhinitis: Secondary | ICD-10-CM | POA: Diagnosis not present

## 2015-05-13 DIAGNOSIS — J301 Allergic rhinitis due to pollen: Secondary | ICD-10-CM | POA: Diagnosis not present

## 2015-05-18 DIAGNOSIS — L820 Inflamed seborrheic keratosis: Secondary | ICD-10-CM | POA: Diagnosis not present

## 2015-05-18 DIAGNOSIS — J301 Allergic rhinitis due to pollen: Secondary | ICD-10-CM | POA: Diagnosis not present

## 2015-05-18 DIAGNOSIS — L814 Other melanin hyperpigmentation: Secondary | ICD-10-CM | POA: Diagnosis not present

## 2015-05-18 DIAGNOSIS — L821 Other seborrheic keratosis: Secondary | ICD-10-CM | POA: Diagnosis not present

## 2015-05-18 DIAGNOSIS — D225 Melanocytic nevi of trunk: Secondary | ICD-10-CM | POA: Diagnosis not present

## 2015-05-18 DIAGNOSIS — J3089 Other allergic rhinitis: Secondary | ICD-10-CM | POA: Diagnosis not present

## 2015-05-18 DIAGNOSIS — J3081 Allergic rhinitis due to animal (cat) (dog) hair and dander: Secondary | ICD-10-CM | POA: Diagnosis not present

## 2015-05-20 DIAGNOSIS — J3081 Allergic rhinitis due to animal (cat) (dog) hair and dander: Secondary | ICD-10-CM | POA: Diagnosis not present

## 2015-05-20 DIAGNOSIS — J3089 Other allergic rhinitis: Secondary | ICD-10-CM | POA: Diagnosis not present

## 2015-05-20 DIAGNOSIS — J301 Allergic rhinitis due to pollen: Secondary | ICD-10-CM | POA: Diagnosis not present

## 2015-05-27 DIAGNOSIS — J3089 Other allergic rhinitis: Secondary | ICD-10-CM | POA: Diagnosis not present

## 2015-05-27 DIAGNOSIS — J301 Allergic rhinitis due to pollen: Secondary | ICD-10-CM | POA: Diagnosis not present

## 2015-05-27 DIAGNOSIS — J3081 Allergic rhinitis due to animal (cat) (dog) hair and dander: Secondary | ICD-10-CM | POA: Diagnosis not present

## 2015-05-29 DIAGNOSIS — J3081 Allergic rhinitis due to animal (cat) (dog) hair and dander: Secondary | ICD-10-CM | POA: Diagnosis not present

## 2015-05-29 DIAGNOSIS — J3089 Other allergic rhinitis: Secondary | ICD-10-CM | POA: Diagnosis not present

## 2015-05-29 DIAGNOSIS — J301 Allergic rhinitis due to pollen: Secondary | ICD-10-CM | POA: Diagnosis not present

## 2015-06-01 ENCOUNTER — Telehealth: Payer: Self-pay | Admitting: Family Medicine

## 2015-06-01 NOTE — Telephone Encounter (Signed)
Pt needs blood work result and copy mail to home address

## 2015-06-02 NOTE — Telephone Encounter (Signed)
I spoke with pt and she did look at results in my chart, no need to mail a copy.

## 2015-06-03 DIAGNOSIS — J3089 Other allergic rhinitis: Secondary | ICD-10-CM | POA: Diagnosis not present

## 2015-06-03 DIAGNOSIS — J301 Allergic rhinitis due to pollen: Secondary | ICD-10-CM | POA: Diagnosis not present

## 2015-06-12 DIAGNOSIS — J3089 Other allergic rhinitis: Secondary | ICD-10-CM | POA: Diagnosis not present

## 2015-06-12 DIAGNOSIS — J301 Allergic rhinitis due to pollen: Secondary | ICD-10-CM | POA: Diagnosis not present

## 2015-06-12 DIAGNOSIS — J3081 Allergic rhinitis due to animal (cat) (dog) hair and dander: Secondary | ICD-10-CM | POA: Diagnosis not present

## 2015-06-19 DIAGNOSIS — N3941 Urge incontinence: Secondary | ICD-10-CM | POA: Diagnosis not present

## 2015-06-19 DIAGNOSIS — J301 Allergic rhinitis due to pollen: Secondary | ICD-10-CM | POA: Diagnosis not present

## 2015-06-19 DIAGNOSIS — J3089 Other allergic rhinitis: Secondary | ICD-10-CM | POA: Diagnosis not present

## 2015-06-19 DIAGNOSIS — J3081 Allergic rhinitis due to animal (cat) (dog) hair and dander: Secondary | ICD-10-CM | POA: Diagnosis not present

## 2015-06-22 ENCOUNTER — Telehealth: Payer: Self-pay | Admitting: Family Medicine

## 2015-06-22 DIAGNOSIS — M810 Age-related osteoporosis without current pathological fracture: Secondary | ICD-10-CM

## 2015-06-22 NOTE — Telephone Encounter (Signed)
Pt had last bone density test in 2014 with dr Linda Hedges. Please confirm and if so pt would like to sch another bone density test at [facility]

## 2015-06-22 NOTE — Telephone Encounter (Signed)
I see where Dr. Linda Hedges ordered a DEXA in 2014 but this was never done. I did put in a new order to get one now, but she needs to call our office for the front staff to actually schedule it.

## 2015-06-23 NOTE — Telephone Encounter (Signed)
Pt has been sch

## 2015-06-26 DIAGNOSIS — J3089 Other allergic rhinitis: Secondary | ICD-10-CM | POA: Diagnosis not present

## 2015-06-26 DIAGNOSIS — J301 Allergic rhinitis due to pollen: Secondary | ICD-10-CM | POA: Diagnosis not present

## 2015-06-29 DIAGNOSIS — W57XXXA Bitten or stung by nonvenomous insect and other nonvenomous arthropods, initial encounter: Secondary | ICD-10-CM | POA: Diagnosis not present

## 2015-06-29 DIAGNOSIS — L03119 Cellulitis of unspecified part of limb: Secondary | ICD-10-CM | POA: Diagnosis not present

## 2015-07-14 DIAGNOSIS — J301 Allergic rhinitis due to pollen: Secondary | ICD-10-CM | POA: Diagnosis not present

## 2015-07-14 DIAGNOSIS — J3089 Other allergic rhinitis: Secondary | ICD-10-CM | POA: Diagnosis not present

## 2015-07-14 DIAGNOSIS — J3081 Allergic rhinitis due to animal (cat) (dog) hair and dander: Secondary | ICD-10-CM | POA: Diagnosis not present

## 2015-07-20 ENCOUNTER — Ambulatory Visit (INDEPENDENT_AMBULATORY_CARE_PROVIDER_SITE_OTHER)
Admission: RE | Admit: 2015-07-20 | Discharge: 2015-07-20 | Disposition: A | Discharge status: Home / Self Care | Payer: Medicare Other | Source: Ambulatory Visit | Attending: Family Medicine | Admitting: Family Medicine

## 2015-07-20 DIAGNOSIS — J3089 Other allergic rhinitis: Secondary | ICD-10-CM | POA: Diagnosis not present

## 2015-07-20 DIAGNOSIS — J3081 Allergic rhinitis due to animal (cat) (dog) hair and dander: Secondary | ICD-10-CM | POA: Diagnosis not present

## 2015-07-20 DIAGNOSIS — M810 Age-related osteoporosis without current pathological fracture: Secondary | ICD-10-CM | POA: Diagnosis not present

## 2015-07-20 DIAGNOSIS — J301 Allergic rhinitis due to pollen: Secondary | ICD-10-CM | POA: Diagnosis not present

## 2015-07-31 DIAGNOSIS — J301 Allergic rhinitis due to pollen: Secondary | ICD-10-CM | POA: Diagnosis not present

## 2015-07-31 DIAGNOSIS — J3089 Other allergic rhinitis: Secondary | ICD-10-CM | POA: Diagnosis not present

## 2015-07-31 DIAGNOSIS — J3081 Allergic rhinitis due to animal (cat) (dog) hair and dander: Secondary | ICD-10-CM | POA: Diagnosis not present

## 2015-08-11 DIAGNOSIS — J3089 Other allergic rhinitis: Secondary | ICD-10-CM | POA: Diagnosis not present

## 2015-08-11 DIAGNOSIS — J301 Allergic rhinitis due to pollen: Secondary | ICD-10-CM | POA: Diagnosis not present

## 2015-08-11 DIAGNOSIS — J3081 Allergic rhinitis due to animal (cat) (dog) hair and dander: Secondary | ICD-10-CM | POA: Diagnosis not present

## 2015-08-16 ENCOUNTER — Other Ambulatory Visit: Payer: Self-pay | Admitting: Internal Medicine

## 2015-08-17 ENCOUNTER — Ambulatory Visit (INDEPENDENT_AMBULATORY_CARE_PROVIDER_SITE_OTHER): Payer: Medicare Other | Admitting: Family Medicine

## 2015-08-17 ENCOUNTER — Encounter: Payer: Self-pay | Admitting: Family Medicine

## 2015-08-17 VITALS — BP 136/78 | Temp 98.5°F | Ht 59.75 in | Wt 88.0 lb

## 2015-08-17 DIAGNOSIS — N390 Urinary tract infection, site not specified: Secondary | ICD-10-CM | POA: Diagnosis not present

## 2015-08-17 DIAGNOSIS — R2681 Unsteadiness on feet: Secondary | ICD-10-CM

## 2015-08-17 DIAGNOSIS — R300 Dysuria: Secondary | ICD-10-CM

## 2015-08-17 DIAGNOSIS — R350 Frequency of micturition: Secondary | ICD-10-CM

## 2015-08-17 LAB — POCT URINALYSIS DIPSTICK
Bilirubin, UA: NEGATIVE
Glucose, UA: NEGATIVE
Ketones, UA: NEGATIVE
Nitrite, UA: NEGATIVE
Protein, UA: NEGATIVE
Spec Grav, UA: 1.01
Urobilinogen, UA: 0.2
pH, UA: 7.5

## 2015-08-17 MED ORDER — CEPHALEXIN 500 MG PO CAPS: 500.0000 mg | ORAL_CAPSULE | Freq: Three times a day (TID) | ORAL | Status: DC

## 2015-08-17 NOTE — Progress Notes (Signed)
Pre visit review using our clinic review tool, if applicable. No additional management support is needed unless otherwise documented below in the visit note. 

## 2015-08-17 NOTE — Progress Notes (Signed)
   Subjective:    Patient ID: Kristine Francis, female    DOB: 1941/02/06, 74 y.o.   MRN: [id number]  HPI Here for several days of urgency to urinate and burning. No fever or nausea. Her last UTI was in April and was treated with  Keflex. She also wants to talk about her chronic unsteadiness and gait issues. She has had brain scans and other workups with no real improvement. She is frustrated by this. She wants to stay as active as possible.    Review of Systems  Constitutional: Negative.   Respiratory: Negative.   Cardiovascular: Negative.   Genitourinary: Positive for dysuria, urgency and frequency. Negative for hematuria and pelvic pain.  Musculoskeletal: Positive for gait problem.  Neurological: Positive for dizziness. Negative for tremors, seizures, syncope, facial asymmetry, speech difficulty, weakness, light-headedness, numbness and headaches.       Objective:   Physical Exam  Constitutional: She is oriented to person, place, and time. She appears well-developed and well-nourished. No distress.  Cardiovascular: Normal rate, regular rhythm, normal heart sounds and intact distal pulses.   Pulmonary/Chest: Effort normal and breath sounds normal.  Abdominal: Soft. Bowel sounds are normal. She exhibits no distension and no mass. There is no tenderness. There is no rebound and no guarding.  Neurological: She is alert and oriented to person, place, and time.  Slightly unsteady when she walks.           Assessment & Plan:  Treat the UTI with Keflex. Drink plenty of water. Culture the sample. We will refer her to Neurology for the gait problems.

## 2015-08-19 LAB — URINE CULTURE: Colony Count: >=100000

## 2015-08-24 ENCOUNTER — Other Ambulatory Visit: Payer: Medicare Other

## 2015-08-24 ENCOUNTER — Encounter: Payer: Self-pay | Admitting: Neurology

## 2015-08-24 ENCOUNTER — Ambulatory Visit (INDEPENDENT_AMBULATORY_CARE_PROVIDER_SITE_OTHER): Payer: Medicare Other | Admitting: Neurology

## 2015-08-24 ENCOUNTER — Other Ambulatory Visit (INDEPENDENT_AMBULATORY_CARE_PROVIDER_SITE_OTHER): Payer: Medicare Other

## 2015-08-24 VITALS — BP 124/72 | HR 92 | Ht 60.0 in | Wt 90.0 lb

## 2015-08-24 DIAGNOSIS — H5500 Unspecified nystagmus: Secondary | ICD-10-CM

## 2015-08-24 DIAGNOSIS — R739 Hyperglycemia, unspecified: Secondary | ICD-10-CM

## 2015-08-24 DIAGNOSIS — G609 Hereditary and idiopathic neuropathy, unspecified: Secondary | ICD-10-CM

## 2015-08-24 LAB — FOLATE: Folate: >23.8 ng/mL (ref >5.9)

## 2015-08-24 LAB — HEMOGLOBIN A1C: Hgb A1c MFr Bld: 6 % (ref 4.6–6.5)

## 2015-08-24 LAB — VITAMIN B12: Vitamin B-12: 1326 pg/mL — ABNORMAL HIGH (ref 211–911)

## 2015-08-24 NOTE — Patient Instructions (Signed)
1. You have been referred to [facility] for physical therapy. They will call you directly to schedule an appointment.  Please call [phone] if you do not hear from them.  2. Your provider has requested that you have labwork completed today. Please go to [facility] on the second floor of this building before leaving the office today. You do not need to check in. If you are not called within 15 minutes please check with the front desk.  3. We will call you with an appt for your EMG.

## 2015-08-24 NOTE — Progress Notes (Signed)
Kristine Francis was seen today in neurologic consultation at the request of FRY,STEPHEN A, MD.  The patient presents today to discuss gait instability.  She has a very long and complicated history.  I reviewed records that are available to me, although I am missing multiple records as well.  She has been seen at [facility] neurology in the past for the same concern and limited records are available from Dr. [name].  She was last seen there in 2014.  The patient has had MRIs of the brain in 2004 in 2008 (which are not available to me) for concerns about gait instability and those were apparently unremarkable.  She also had an MRI of the brain on 04/19/2012 at [facility] and I do have that report.  This was reported to be unremarkable with the exception of scattered T2 hyperintensities.  The patient reports that there really has been no known etiology for her gait instability.  During the workup of her gait instability, nystagmus was noted and thought that perhaps that was in etiology.  She saw [facility] and that was felt to be just chronic latent nystagmus.  She saw Dr. [name], neuro-ophthalmologist, at [facility] and he did not see any concerns besides for her nystagmus, which she felt was peripheral in nature.  She believes that her symptoms started 15 years ago.  She reports that symptoms have progressively gotten worse over the years.  She states that when she is walking her dogs she feels that trees and the light poles are going up and down.  She will run into walls when walking.  She does occasionally have falls when walking her dog, when walking up a slight incline.  When carrying groceries in both hands, she has had a fall.  She can no longer shower and wash her hair in the shower and chooses to bathe instead and wash her hair that way. She cannot pray at church while standing without holding onto a pew.  Her feet feel normal.  She is most balanced when she uses a cart at [employer].  She does  state that she has cramping in the feet but she is taking Mg++, Ca++ and Zn++ supplements for that.  She cannot wear even a low heel because it "makes me off balance."  No family history of similar.  She did go to PT in 2013/2014 and it helped somewhat.  No EMG test.      ALLERGIES:   Allergies  Allergen Reactions  . Contrast Media [Iodinated Diagnostic Agents] Other (See Comments)    Congestion .Marland KitchenMarland Kitchenpatient stated it irritated her eyes and voice  . Iohexol Other (See Comments)    Gi upset    CURRENT MEDICATIONS:  Outpatient Encounter Prescriptions as of 08/24/2015  Medication Sig  . APPLE CIDER VINEGAR PO Take by mouth daily.  Marland Kitchen aspirin 81 MG tablet Take 81 mg by mouth daily.    Marland Kitchen b complex vitamins tablet Take 1 tablet by mouth daily.  Marland Kitchen CALCIUM-MAGNESIUM-ZINC PO Take 1 tablet by mouth 3 (three) times daily.  . cephALEXin (KEFLEX) 500 MG capsule Take 1 capsule (500 mg total) by mouth 3 (three) times daily.  . Cholecalciferol (VITAMIN D PO) Take 1 capsule by mouth daily.  . Glucosamine Sulfate 1000 MG TABS Take 1 tablet by mouth 2 (two) times daily.  Marland Kitchen Lysine 500 MG TABS Take by mouth daily.  . Multiple Vitamins-Minerals (CENTRUM SILVER PO) Take 1 capsule by mouth daily.    Marland Kitchen  Vinpocetine POWD 5 mg by Does not apply route 2 (two) times daily.  . vitamin E 600 UNIT capsule Take 600 Units by mouth daily.    . [DISCONTINUED] cephALEXin (KEFLEX) 500 MG capsule Take 1 capsule (500 mg total) by mouth 3 (three) times daily.  . [DISCONTINUED] Ginkgo Biloba 100 MG CAPS Take 2 capsules by mouth daily.  . [DISCONTINUED] omeprazole (PRILOSEC) 40 MG capsule TAKE 1 CAPSULE DAILY (Patient not taking: Reported on 08/17/2015)   No facility-administered encounter medications on file as of 08/24/2015.    PAST MEDICAL HISTORY:   Past Medical History  Diagnosis Date  . Ataxia   . Emphysema   . Diverticulosis   . Renal cyst   . Osteopenia   . GERD (gastroesophageal reflux disease)   . Iron deficiency  anemia, unspecified   . Kidney cysts 11/16/12    small cyst on left  . Nystagmus, end-position   . Gait abnormality 05/14/2013    Ms.Koppel a patient of Dr. Linda Hedges presented first interpreter thousand 13 with a gait dysfunction, she also had an episodic confusion and Loss device. Exam found a mild nystagmus and she was referred to ophthalmology on 03-2812, brain MRI was normal nystagmus was a secondary diagnosis to vestibulitis, ear nose and throat has followed and had seen the patient. At the time of the nystagmus the patie  . Hepatic steatosis   . Allergy   . Asthma     sees Dr. Tiajuana Amass     PAST SURGICAL HISTORY:   Past Surgical History  Procedure Laterality Date  . Tonsillectomy    . Breast biopsy Bilateral 1970's  . Cervical spine surgery  2010  . Hernia repair  Oct '14-left, remote-right    years ago right; left done '14  . Cataract extraction Bilateral LT:02/14/13,RT:02/21/13  . Skin cancer excision  05/13/13    FACE, basal cell  . Eye surgery Bilateral 01/2013&02/2013    left then right    SOCIAL HISTORY:   Social History   Social History  . Marital Status: Married    Spouse Name: N/A  . Number of Children: 3  . Years of Education: 16   Occupational History  . 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     Social History Main Topics  . Smoking status: Never Smoker   . Smokeless tobacco: Never Used  . Alcohol Use: No  . Drug Use: No  . Sexual Activity:    Partners: Male   Other Topics Concern  . 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 married - 1965. 3 sons - '66, '69, '70. Sons in good health. Marriage in good health. Enjoys retirement - remains active.          FAMILY HISTORY:   Family Status  Relation Status Death Age  . Father Deceased 64    heart ds  . Mother Deceased 31    pancreatic cancer  . Sister Alive     3, healthy  . Child Alive     3, healthy    ROS:  A complete 10 system  review of systems was obtained and was unremarkable apart from what is mentioned above.  PHYSICAL EXAMINATION:    VITALS:   Filed Vitals:   08/24/15 1400  BP: 124/72  Pulse: 92  Height: 5' (1.524 m)  Weight: 90 lb (40.824 kg)    GEN:  Normal appears  female in no acute distress.  Appears stated age. HEENT:  Normocephalic, atraumatic. The mucous membranes are moist. The superficial temporal arteries are without ropiness or tenderness. Cardiovascular: Regular rate and rhythm. Lungs: Clear to auscultation bilaterally. Neck/Heme: There are no carotid bruits noted bilaterally.  NEUROLOGICAL: Orientation:  The patient is alert and oriented x 3.  Fund of knowledge is appropriate.  Recent and remote memory intact.  Attention span and concentration normal.  Repeats and names without difficulty. Cranial nerves: There is good facial symmetry. The pupils are equal round and reactive to light bilaterally. Fundoscopic exam reveals clear disc margins bilaterally. Extraocular muscles are intact with significant horizontal nystagmus (none vertical) and visual fields are full to confrontational testing. Speech is fluent and clear. Soft palate rises symmetrically and there is no tongue deviation. Hearing is intact to conversational tone. Tone: Tone is good throughout. Sensation: Sensation is intact to light touch and pinprick throughout (facial, trunk, extremities).  Pinprick is decreased in the lower extremities compared to that of the upper extremities.  Vibration is markedly decreased in a distal fashion, both in the upper and lower extremities.  There is no extinction with double simultaneous stimulation. Coordination:  The patient has no difficulty with RAM's or FNF bilaterally. Motor: Strength is 5/5 in the bilateral upper and lower extremities.  Shoulder shrug is equal and symmetric. There is no pronator drift.  There are no fasciculations noted. DTR's: Deep tendon reflexes are 1/4 at the bilateral  biceps, triceps, brachioradialis, 2 -/4 at the bilateral patella and absent at the bilateral achilles.  Plantar responses are downgoing bilaterally. Gait and Station: The patient is wide-based and unsteady.  She is unable to ambulate in a tandem fashion.  She has some difficulty with heel-toe walking.  She sways in the Romberg position with eyes open, but is completely unable to do it with eyes closed  Labs:  No results found for: VITAMINB12    Chemistry      Component Value Date/Time   NA 137 04/24/2015 1120   K 4.4 04/24/2015 1120   CL 100 04/24/2015 1120   CO2 33* 04/24/2015 1120   BUN 13 04/24/2015 1120   CREATININE 0.59 04/24/2015 1120      Component Value Date/Time   CALCIUM 9.6 04/24/2015 1120   ALKPHOS 76 04/24/2015 1120   AST 23 04/24/2015 1120   ALT 19 04/24/2015 1120   BILITOT 0.9 04/24/2015 1120     Lab Results  Component Value Date   TSH 1.30 04/24/2015   No results found for: HGBA1C   IMPRESSION/PLAN  1.  Gait instability  -Has evidence of peripheral neuropathy on examination, which coincides with her history.  Long discussion today regarding peripheral neuropathy as well as its nature and possible etiologies.  Talked extensively about safety.  Greater than 50% of the 60 minute visit spent in counseling.  Talked about the importance of night lights and getting rid of throw rugs.  Talked about ambulatory assistive devices.  I do think she could benefit from a walker and will send her to physical therapy, both for gait and balance training as well as for evaluation for possible walker.  -She will have an EMG  -She will have lab work to include B12, folate, RPR, SPEP/UPEP with immunofixation, hemoglobin A1c (prior history of hyperglycemia on labs)  -She asked multiple questions and I answered them to the best of my ability. 2.  Chronic nystagmus  -Likely unrelated to her peripheral neuropathy.  This has been worked  up extensively by both neuro-ophthalmology as well  as ENT. 3.  We will call her with the results of the above testing.

## 2015-08-25 LAB — RPR

## 2015-08-26 LAB — UIFE/LIGHT CHAINS/TP QN, 24-HR UR
Albumin, U: DETECTED
Total Protein, Urine: <4 mg/dL — ABNORMAL LOW (ref 5–24)

## 2015-08-26 LAB — SPEP & IFE WITH QIG
Albumin ELP: 3.8 g/dL (ref 3.8–4.8)
Alpha-1-Globulin: 0.4 g/dL — ABNORMAL HIGH (ref 0.2–0.3)
Alpha-2-Globulin: 0.9 g/dL (ref 0.5–0.9)
Beta 2: 0.4 g/dL (ref 0.2–0.5)
Beta Globulin: 0.5 g/dL (ref 0.4–0.6)
Gamma Globulin: 0.9 g/dL (ref 0.8–1.7)
IgA: 427 mg/dL — ABNORMAL HIGH (ref 69–380)
IgG (Immunoglobin G), Serum: 924 mg/dL (ref 690–1700)
IgM, Serum: 80 mg/dL (ref 52–322)
Total Protein, Serum Electrophoresis: 6.9 g/dL (ref 6.1–8.1)

## 2015-08-27 ENCOUNTER — Telehealth: Payer: Self-pay | Admitting: Neurology

## 2015-08-27 NOTE — Telephone Encounter (Signed)
Left message on machine for patient to call back.

## 2015-08-27 NOTE — Telephone Encounter (Signed)
-----   Message from [place], DO sent at 08/27/2015  7:42 AM EDT ----- Please let pt know that her labs looked good

## 2015-08-27 NOTE — Telephone Encounter (Signed)
No call back. Letter mailed to patient making her aware labs normal.

## 2015-09-01 ENCOUNTER — Ambulatory Visit (INDEPENDENT_AMBULATORY_CARE_PROVIDER_SITE_OTHER): Payer: Medicare Other | Admitting: Neurology

## 2015-09-01 ENCOUNTER — Telehealth: Payer: Self-pay | Admitting: Neurology

## 2015-09-01 DIAGNOSIS — J3081 Allergic rhinitis due to animal (cat) (dog) hair and dander: Secondary | ICD-10-CM | POA: Diagnosis not present

## 2015-09-01 DIAGNOSIS — G609 Hereditary and idiopathic neuropathy, unspecified: Secondary | ICD-10-CM | POA: Diagnosis not present

## 2015-09-01 DIAGNOSIS — J3089 Other allergic rhinitis: Secondary | ICD-10-CM | POA: Diagnosis not present

## 2015-09-01 DIAGNOSIS — J301 Allergic rhinitis due to pollen: Secondary | ICD-10-CM | POA: Diagnosis not present

## 2015-09-01 NOTE — Telephone Encounter (Signed)
-----   Message from [place], DO sent at 09/01/2015  4:13 PM EDT ----- Please let the patient know that her EMG was consistent with her clinical examination and history and showed evidence of a diffuse peripheral neuropathy.  Therapy may help as we discussed.

## 2015-09-01 NOTE — Telephone Encounter (Signed)
Left message on machine for patient to call back.

## 2015-09-01 NOTE — Procedures (Signed)
[facility]  [place], [place]  [place],  [address] Tel: [phone] Fax:  [phone] Test Date:  09/01/2015  Patient: Kristine Francis DOB: 1941-08-09 Physician: Narda Amber  Sex: Female Height: 5\' 1"  Ref Phys: Alonza Bogus  ID#: [id number] Temp: 33.6C Technician: Jerilynn Mages. Dean   Patient Complaints: This is 74 year old female presenting for evaluation of gait difficulty and imbalance.   NCV & EMG Findings: Extensive electrodiagnostic testing of the right lower extremity and additional studies of the left shows:  1. Bilateral sural and superficial peroneal sensory responses are absent. 2. Bilateral peroneal and motor responses are within normal limits. 3. Chronic motor axon loss changes are seen affecting the muscles below the knee and conform to a gradient pattern. There is no evidence of active denervation.  Impression: The electrophysiologic findings are most consistent with a distal and symmetric sensorimotor polyneuropathy, axon loss in type, affecting the lower extremities.   ___________________________ Narda Amber, DO    Nerve Conduction Studies Anti Sensory Summary Table   Stim Site NR Peak (ms) Norm Peak (ms) P-T Amp (V) Norm P-T Amp  Left Sup Peroneal Anti Sensory (Ant Lat Mall)  33.6C  12 cm NR  <4.6  >3  Right Sup Peroneal Anti Sensory (Ant Lat Mall)  33.6C  12 cm NR  <4.6  >3  Left Sural Anti Sensory (Lat Mall)  33.6C  Calf NR  <4.6  >3  Right Sural Anti Sensory (Lat Mall)  33.6C  Calf NR  <4.6  >3   Motor Summary Table   Stim Site NR Onset (ms) Norm Onset (ms) O-P Amp (mV) Norm O-P Amp Site1 Site2 Delta-0 (ms) Dist (cm) Vel (m/s) Norm Vel (m/s)  Left Peroneal Motor (Ext Dig Brev)  33.6C  Ankle    3.4 <6.0 4.5 >2.5 B Fib Ankle 6.3 28.0 44 >40  B Fib    9.7  4.1  Poplt B Fib 1.9 10.0 53 >40  Poplt    11.6  4.0         Right Peroneal Motor (Ext Dig Brev)  33.6C  Ankle    3.5 <6.0 2.9 >2.5 B Fib Ankle 6.7 30.0 45 >40  B Fib     10.2  2.9  Poplt B Fib 1.8 10.0 56 >40  Poplt    12.0  2.9         Left Tibial Motor (Abd Hall Brev)  33.6C  Ankle    3.9 <6.0 12.6 >4 Knee Ankle 8.5 37.0 44 >40  Knee    12.4  9.4         Right Tibial Motor (Abd Hall Brev)  33.6C  Ankle    4.4 <6.0 16.2 >4 Knee Ankle 8.9 39.0 44 >40  Knee    13.3  13.0          H Reflex Studies   NR H-Lat (ms) Lat Norm (ms) L-R H-Lat (ms)  Right Tibial (Gastroc)  33.6C     34.29 <35 0.00   EMG   Side Muscle Ins Act Fibs Psw Fasc Number Recrt Dur Dur. Amp Amp. Poly Poly. Comment  Right AntTibialis Nml Nml Nml Nml 1- Rapid Some 1+ Few 1+ Nml Nml N/A  Right Gastroc Nml Nml Nml Nml 2- Rapid Some 1+ Some 1+ Nml Nml N/A  Right Flex Dig Long Nml Nml Nml Nml 1- Rapid Some 1+ Some 1+ Nml Nml N/A  Right RectFemoris Nml Nml Nml Nml Nml Nml Nml Nml Nml Nml Nml  Nml N/A  Right GluteusMed Nml Nml Nml Nml Nml Nml Nml Nml Nml Nml Nml Nml N/A  Right BicepsFemS Nml Nml Nml Nml Nml Nml Nml Nml Nml Nml Nml Nml N/A  Left AntTibialis Nml Nml Nml Nml 1- Rapid Some 1+ Some 1+ Nml Nml N/A  Left Gastroc Nml Nml Nml Nml 2- Rapid Some 1+ Some 1+ Nml Nml N/A  Left RectFemoris Nml Nml Nml Nml Nml Nml Nml Nml Nml Nml Nml Nml N/A      Waveforms:

## 2015-09-02 NOTE — Telephone Encounter (Signed)
Patient made aware of EMG results. She will call if needed.

## 2015-09-07 ENCOUNTER — Ambulatory Visit (INDEPENDENT_AMBULATORY_CARE_PROVIDER_SITE_OTHER): Payer: Medicare Other | Admitting: Family Medicine

## 2015-09-07 DIAGNOSIS — J3081 Allergic rhinitis due to animal (cat) (dog) hair and dander: Secondary | ICD-10-CM | POA: Diagnosis not present

## 2015-09-07 DIAGNOSIS — Z23 Encounter for immunization: Secondary | ICD-10-CM

## 2015-09-07 DIAGNOSIS — J301 Allergic rhinitis due to pollen: Secondary | ICD-10-CM | POA: Diagnosis not present

## 2015-09-07 DIAGNOSIS — J3089 Other allergic rhinitis: Secondary | ICD-10-CM | POA: Diagnosis not present

## 2015-09-08 ENCOUNTER — Encounter: Payer: Self-pay | Admitting: Rehabilitation

## 2015-09-08 ENCOUNTER — Ambulatory Visit: Payer: Medicare Other | Attending: Neurology | Admitting: Rehabilitation

## 2015-09-08 DIAGNOSIS — R2681 Unsteadiness on feet: Secondary | ICD-10-CM | POA: Diagnosis not present

## 2015-09-08 DIAGNOSIS — R269 Unspecified abnormalities of gait and mobility: Secondary | ICD-10-CM | POA: Insufficient documentation

## 2015-09-08 DIAGNOSIS — R531 Weakness: Secondary | ICD-10-CM | POA: Insufficient documentation

## 2015-09-08 NOTE — Therapy (Addendum)
[place] [address] [place], [place], [address] Phone: [phone]   Fax:  [phone]  Physical Therapy Evaluation  Patient Details  Name: Kristine Francis MRN: [id number] Date of Birth: Aug 11, 1941 Referring Provider: Alonza Bogus, MD  Encounter Date: 09/08/2015      PT End of Session - 09/08/15 1930    Visit Number 1   Number of Visits 17   Date for PT Re-Evaluation 11/07/15   Authorization Type MCR, G codes every 10th visit ([employer] secondary, no PTA)   PT Start Time 1445   PT Stop Time 1530   PT Time Calculation (min) 45 min   Activity Tolerance Patient tolerated treatment well   Behavior During Therapy [facility] for tasks assessed/performed      Past Medical History  Diagnosis Date  . Ataxia   . Emphysema   . Diverticulosis   . Renal cyst   . Osteopenia   . GERD (gastroesophageal reflux disease)   . Iron deficiency anemia, unspecified   . Kidney cysts 11/16/12    small cyst on left  . Nystagmus, end-position   . Gait abnormality 05/14/2013    Ms.Schram a patient of Dr. Linda Hedges presented first interpreter thousand 13 with a gait dysfunction, she also had an episodic confusion and Loss device. Exam found a mild nystagmus and she was referred to ophthalmology on 03-2812, brain MRI was normal nystagmus was a secondary diagnosis to vestibulitis, ear nose and throat has followed and had seen the patient. At the time of the nystagmus the patie  . Hepatic steatosis   . Allergy   . Asthma     sees Dr. Tiajuana Amass     Past Surgical History  Procedure Laterality Date  . Tonsillectomy    . Breast biopsy Bilateral 1970's  . Cervical spine surgery  2010  . Hernia repair  Oct '14-left, remote-right    years ago right; left done '14  . Cataract extraction Bilateral LT:02/14/13,RT:02/21/13  . Skin cancer excision  05/13/13    FACE, basal cell  . Eye surgery Bilateral 01/2013&02/2013    left then right    There were  no vitals filed for this visit.  Visit Diagnosis:  Abnormality of gait - Plan: PT plan of care cert/re-cert  Unsteadiness - Plan: PT plan of care cert/re-cert  Generalized weakness - Plan: PT plan of care cert/re-cert      Subjective Assessment - 09/08/15 1458    Subjective Pt presents today because of her "imbalance".  She states that she has fallen several times in yard or when making turns.  She also describes her nystagmus and feeling as though things are bouncing up and down and that she has to stop moving in order to be able to tell that things are moving towards her or away from her.  States she cannot make quick movements.     Pertinent History nystagmus for 7 years   Limitations Walking;House hold activities   Patient Stated Goals "I would like to walk without falling and walk straight."    Currently in Pain? No/denies            [facility] PT Assessment - 09/08/15 0001    Assessment   Medical Diagnosis Hereditary and idiopathic peripheral neuropathy   Referring Provider Alonza Bogus, MD   Onset Date/Surgical Date 08/24/15   Precautions   Precautions Fall   Restrictions   Weight Bearing Restrictions No   Balance Screen   Has the patient fallen  in the past 6 months Yes   How many times? 4   Has the patient had a decrease in activity level because of a fear of falling?  No   Is the patient reluctant to leave their home because of a fear of falling?  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   Cognition    Overall Cognitive Status Within Functional Limits for tasks assessed   Sensation   Light Touch Appears Intact   Proprioception Appears Intact   Coordination   Gross Motor Movements are Fluid and Coordinated Yes   Fine Motor Movements are Fluid and Coordinated Yes   ROM / Strength   AROM / PROM / Strength Strength   Strength   Overall Strength Within functional limits for tasks performed  grossly 4/5 BLEs   Transfers   Transfers Sit to Stand;Stand to Sit   Sit to Stand 6: Modified independent (Device/Increase time)   Stand to Sit 6: Modified independent (Device/Increase time)   Ambulation/Gait   Ambulation/Gait Yes   Ambulation/Gait Assistance 5: Supervision   Ambulation Distance (Feet) 345 Feet   Assistive device None   Gait Pattern Step-through pattern;Decreased stride length;Lateral hip instability;Trunk flexed  Pt with increased gait speed and tends to stagger R and L    Ambulation Surface Level;Indoor   Gait velocity 3.52 ft/sec   Stairs Yes   Stairs Assistance 6: Modified independent (Device/Increase time)   Stair Management Technique Two rails;Alternating pattern;Forwards   Number of Stairs 4   Height of Stairs 6   Balance   Balance Assessed Yes   Standardized Balance Assessment   Standardized Balance Assessment Dynamic Gait Index   Dynamic Gait Index   Level Surface Mild Impairment   Change in Gait Speed Mild Impairment   Gait with Horizontal Head Turns Moderate Impairment   Gait with Vertical Head Turns Moderate Impairment   Gait and Pivot Turn Mild Impairment   Step Over Obstacle Mild Impairment   Step Around Obstacles Mild Impairment   Steps Mild Impairment   Total Score 14   DGI comment: Scores of 19 or less are predictive of falls in older community living adults            Vestibular Assessment - 09/08/15 0001    Symptom Behavior   Type of Dizziness Unsteady with head/body turns   Frequency of Dizziness anytime making turns, walking in crowds    Aggravating Factors Turning body quickly;Turning head quickly;Walking in a crowd   Relieving Factors Avoiding busy/distracting areas;Slow movements;Head stationary   Occulomotor Exam   Occulomotor Alignment Normal   Spontaneous Absent   Gaze-induced Direction changing nystagmus   Smooth Pursuits --  unable to follow target, tends to overshoot   Saccades Poor trajectory                       PT Education - 09/08/15 1929    Education provided Yes   Education Details Education  on evaluation findings, vestibular deficits, and POC.    Person(s) Educated Patient   Methods Explanation   Comprehension Verbalized understanding          PT Short Term Goals - 09/08/15 1942    PT SHORT TERM GOAL #1   Title Pt will initiate HEP for vestibular compensation and balance to decrease fall risk.  (Target Date: 10/06/15)   Time 4   Period Weeks   PT SHORT TERM GOAL #2   Title Pt will independently verbalize strategies to compensate for vestibular hypofunction in order to decrease fall risk.  (Target Date: 10/06/15)   Time 4   Period Weeks   PT SHORT TERM GOAL #3   Title Pt will increase DGI to 17/24 in order to indicate functional improvement in balance and decreased fall risk. (Target Date: 10/06/15)   Time 4   Period Weeks   PT SHORT TERM GOAL #4   Title Pt will negotiate in mildly busy environment without AD at S level without overt LOB to demonstrate safe introduction to community.  (Target Date: 10/06/15)   PT SHORT TERM GOAL #5   Title Will provide pt with [place] in order to assess the self-perceived handicapping effects imposed by pts vestibular involvement.  (Target Date: 10/06/15)           PT Long Term Goals - 09/08/15 1952    PT LONG TERM GOAL #1   Title Pt will be independent with HEP for vestibular deficits and balance to indicate decrease fall risk.  (Target date: 11/03/15)   Time 4   Period Weeks   PT LONG TERM GOAL #2   Title Pt will increase DGI to 20/24  to indicate decreased fall risk and improved functional balance.  (Target date: 11/03/15)   Time 4   Period Weeks   PT LONG TERM GOAL #3   Title Pt will demonstrate ability to ambulate in community setting (stores, etc) with LRAD at mod I level to indicate safe community negotiation.   (Target date: 11/03/15)   Time 4   Period Weeks   PT LONG TERM GOAL #4   Title Pt will improve DHI by 17 points in order to indicate improved self-perceived handicapping effects imposed by vestibular deficits.  (Target date: 11/03/15)   Time 4   Period Weeks               Plan - 09/08/15 1934    Clinical Impression Statement Pt presents with new diagnosis of hereditary and idiopathic peripheral neuropathy in the beginning of Oct.  Pt with nerve conduction tests indicative of neuropathy and also note pts long standing history of nystagmus, present for 7 years, but per MD and pt report, has gotten worse over last 2-3 years.  See vestibular section for details, but seems to have B vestibular hypofunction and is very reliant on visual input.  Based on findings of evaluation and DGI scre of 14/24, all placing pt at high risk for fall and noted Fear avoidance belief about physical activity score of 19, feel that pt would greatly benefit from OP neuro PT to address deficits.     Pt will benefit from skilled therapeutic intervention in order to improve on the following deficits Abnormal gait;Decreased balance;Decreased coordination;Decreased mobility;Difficulty walking;Dizziness;Impaired perceived functional ability;Impaired vision/preception;Postural dysfunction   Rehab Potential Good   PT Frequency 2x / week   PT Duration 8 weeks   PT Treatment/Interventions ADLs/Self Care Home Management;Canalith Repostioning;Gait training;Stair training;Functional mobility training;Therapeutic activities;Therapeutic exercise;Balance training;Neuromuscular  re-education;Patient/family education;Vestibular;Visual/perceptual  remediation/compensation   PT Next Visit Plan Continue to assess vestibular involvement, SOT.  Provide pt with compensatory vestibular movements (eyes, head, body), small ROM gaze stabilization, saccades for eyes then head to target.     Consulted and Agree with Plan of Care Patient          G-Codes - 09-27-15 1948    Functional Assessment Tool Used DGI 14/24   Functional Limitation Mobility: Walking and moving around   Mobility: Walking and Moving Around Current Status [phone]) At least 40 percent but less than 60 percent impaired, limited or restricted      Mobility: Walking and Moving Around Goal Status [phone]) At least 1 percent but less than 20 percent impaired, limited or restricted    Problem List Patient Active Problem List   Diagnosis Date Noted  . Hyperlipidemia 04/24/2015  . Dysuria 02/11/2015  . Acute cystitis without hematuria 02/11/2015  . Chest pain 02/06/2015  . Elevated BP 02/06/2015  . Left inguinal hernia 08/02/2013  . Gait abnormality 05/14/2013  . Gait disorder   . Nystagmus, end-position   . Routine health maintenance 12/13/2011  . LEG CRAMPS, NOCTURNAL 12/07/2010  . CALLUS, TOE 03/19/2010  . hip pain 03/19/2010  . ATAXIA 11/17/2008  . EMPHYSEMA 10/21/2008  . CERVICAL RADICULOPATHY, RIGHT 10/21/2008  . DIVERTICULOSIS OF COLON 08/14/2008  . RENAL CYST 08/14/2008  . ANEMIA-IRON DEFICIENCY 06/16/2007  . Asthma 06/16/2007  . GERD 06/16/2007  . OSTEOPENIA 06/16/2007    Cameron Sprang, PT, MPT [facility] [address] [place], [place], [address] Phone: [phone]   Fax:  [phone] Sep 27, 2015, 8:18 PM  Name: Kristine Francis MRN: [id number] Date of Birth: 12/27/1940

## 2015-09-15 ENCOUNTER — Ambulatory Visit: Payer: Medicare Other | Admitting: Rehabilitation

## 2015-09-15 ENCOUNTER — Encounter: Payer: Self-pay | Admitting: Internal Medicine

## 2015-09-15 ENCOUNTER — Encounter: Payer: Self-pay | Admitting: Rehabilitation

## 2015-09-15 DIAGNOSIS — R269 Unspecified abnormalities of gait and mobility: Secondary | ICD-10-CM

## 2015-09-15 DIAGNOSIS — J3081 Allergic rhinitis due to animal (cat) (dog) hair and dander: Secondary | ICD-10-CM | POA: Diagnosis not present

## 2015-09-15 DIAGNOSIS — R2681 Unsteadiness on feet: Secondary | ICD-10-CM

## 2015-09-15 DIAGNOSIS — J301 Allergic rhinitis due to pollen: Secondary | ICD-10-CM | POA: Diagnosis not present

## 2015-09-15 DIAGNOSIS — J3089 Other allergic rhinitis: Secondary | ICD-10-CM | POA: Diagnosis not present

## 2015-09-15 DIAGNOSIS — R531 Weakness: Secondary | ICD-10-CM | POA: Diagnosis not present

## 2015-09-15 NOTE — Therapy (Signed)
[place] [address] [place], [place], [address] Phone: [phone]   Fax:  [phone]  Physical Therapy Treatment  Patient Details  Name: Kristine Francis MRN: [id number] Date of Birth: 1941/03/13 Referring Provider: Alonza Bogus, MD  Encounter Date: 09/15/2015      PT End of Session - 09/15/15 1743    Visit Number 2   Number of Visits 17   Date for PT Re-Evaluation 11/07/15   Authorization Type MCR, G codes every 10th visit (T[employer] secondary, no PTA)   PT Start Time 1335  pt late for session   PT Stop Time 1403   PT Time Calculation (min) 28 min   Activity Tolerance Patient tolerated treatment well   Behavior During Therapy [facility] for tasks assessed/performed      Past Medical History  Diagnosis Date  . Ataxia   . Emphysema   . Diverticulosis   . Renal cyst   . Osteopenia   . GERD (gastroesophageal reflux disease)   . Iron deficiency anemia, unspecified   . Kidney cysts 11/16/12    small cyst on left  . Nystagmus, end-position   . Gait abnormality 05/14/2013    Ms.Milan a patient of Dr. Linda Hedges presented first interpreter thousand 13 with a gait dysfunction, she also had an episodic confusion and Loss device. Exam found a mild nystagmus and she was referred to ophthalmology on 03-2812, brain MRI was normal nystagmus was a secondary diagnosis to vestibulitis, ear nose and throat has followed and had seen the patient. At the time of the nystagmus the patie  . Hepatic steatosis   . Allergy   . Asthma     sees Dr. Tiajuana Amass     Past Surgical History  Procedure Laterality Date  . Tonsillectomy    . Breast biopsy Bilateral 1970's  . Cervical spine surgery  2010  . Hernia repair  Oct '14-left, remote-right    years ago right; left done '14  . Cataract extraction Bilateral LT:02/14/13,RT:02/21/13  . Skin cancer excision  05/13/13    FACE, basal cell  . Eye surgery Bilateral 01/2013&02/2013    left then  right    There were no vitals filed for this visit.  Visit Diagnosis:  Unsteadiness  Abnormality of gait      Subjective Assessment - 09/15/15 1338    Subjective Pt reports no changes since last visit and no falls. "I'm being really careful so I don't have to report a fall."    Pertinent History nystagmus for 7 years   Limitations Walking;House hold activities   Patient Stated Goals "I would like to walk without falling and walk straight."    Currently in Pain? No/denies             TA:  Performed assessment of vestibular findings to support end of last evaluation session.  Findings as follows; frog jumping/poor trajectory with horizontal VOR, difficult to fully assess saccades as she demonstrates nystagmus horizontally, note very little to no convergence-eyes only move down but do not adduct.  Educated on findings and what this relates to functionally.  Also provided education on increasing cervical ROM during sessions to improve cervical occular reflex, and education on providing exercises to slowly increase vestibular systems response to decrease fall risk and improve function.    NMR:  Performed imaginary visual targeting approx 2-3 feet from target, keeping eyes on target at midline, closing eyes then moving head to the R, opening eyes while still keeping eyes  on projected target.  Note that when performing head turn to the R, pt with vertical double vision, therefore educated to decrease ROM to keep target to single "A".  Also performed to the L, again with cues for smaller movements to ensure target remains in focus and is not double.  Pt verbalized understanding.  Pt requires max cues during exercise, therefore will not provide HEP until next visit.                     PT Education - 09/15/15 1338    Education provided Yes   Education Details Education on vestibular deficits and continued POC to improve system and safety.    Person(s) Educated Patient    Methods Explanation   Comprehension Verbalized understanding          PT Short Term Goals - 09/08/15 1942    PT SHORT TERM GOAL #1   Title Pt will initiate HEP for vestibular compensation and balance to decrease fall risk.  (Target Date: 10/06/15)   Time 4   Period Weeks   PT SHORT TERM GOAL #2   Title Pt will independently verbalize strategies to compensate for vestibular hypofunction in order to decrease fall risk.  (Target Date: 10/06/15)   Time 4   Period Weeks   PT SHORT TERM GOAL #3   Title Pt will increase DGI to 17/24 in order to indicate functional improvement in balance and decreased fall risk. (Target Date: 10/06/15)   Time 4   Period Weeks   PT SHORT TERM GOAL #4   Title Pt will negotiate in mildly busy environment without AD at S level without overt LOB to demonstrate safe introduction to community.  (Target Date: 10/06/15)   PT SHORT TERM GOAL #5   Title Will provide pt with [place] in order to assess the self-perceived handicapping effects imposed by pts vestibular involvement.  (Target Date: 10/06/15)           PT Long Term Goals - 09/08/15 1952    PT LONG TERM GOAL #1   Title Pt will be independent with HEP for vestibular deficits and balance to indicate decrease fall risk.  (Target date: 11/03/15)   Time 4   Period Weeks   PT LONG TERM GOAL #2   Title Pt will increase DGI to 20/24 to indicate decreased fall risk and improved functional balance.  (Target date: 11/03/15)   Time 4   Period Weeks   PT LONG TERM GOAL #3   Title Pt will demonstrate ability to ambulate in community setting (stores, etc) with LRAD at mod I level to indicate safe community negotiation.   (Target date: 11/03/15)   Time 4   Period Weeks   PT LONG TERM GOAL #4   Title Pt will improve DHI by 17 points in order to indicate improved self-perceived handicapping effects imposed by vestibular deficits.  (Target date: 11/03/15)   Time 4   Period Weeks               Plan - 09/15/15  1744    Clinical Impression Statement Skilled session focused on continued assessment of vestibular findings and deficits with education regarding these deficits and the need to strengthen vestibular system to decrease fall risk.  Introduced imaginary targeting to pt at end of session.  Will continue to provide exercises during next session.    Pt will benefit from skilled therapeutic intervention in order to improve on the following deficits Abnormal gait;Decreased balance;Decreased  coordination;Decreased mobility;Difficulty walking;Dizziness;Impaired perceived functional ability;Impaired vision/preception;Postural dysfunction   Rehab Potential Good   PT Frequency 2x / week   PT Duration 8 weeks   PT Treatment/Interventions ADLs/Self Care Home Management;Canalith Repostioning;Gait training;Stair training;Functional mobility training;Therapeutic activities;Therapeutic exercise;Balance training;Neuromuscular re-education;Patient/family education;Vestibular;Visual/perceptual remediation/compensation   PT Next Visit Plan Continue to assess vestibular involvement, SOT.  Provide pt with compensatory vestibular movements (eyes, head, body), small ROM gaze stabilization, saccades for eyes then head to target.  Imaginary targeting.Give [place].    Consulted and Agree with Plan of Care Patient        Problem List Patient Active Problem List   Diagnosis Date Noted  . Hyperlipidemia 04/24/2015  . Dysuria 02/11/2015  . Acute cystitis without hematuria 02/11/2015  . Chest pain 02/06/2015  . Elevated BP 02/06/2015  . Left inguinal hernia 08/02/2013  . Gait abnormality 05/14/2013  . Gait disorder   . Nystagmus, end-position   . Routine health maintenance 12/13/2011  . LEG CRAMPS, NOCTURNAL 12/07/2010  . CALLUS, TOE 03/19/2010  . hip pain 03/19/2010  . ATAXIA 11/17/2008  . EMPHYSEMA 10/21/2008  . CERVICAL RADICULOPATHY, RIGHT 10/21/2008  . DIVERTICULOSIS OF COLON 08/14/2008  . RENAL CYST 08/14/2008  .  ANEMIA-IRON DEFICIENCY 06/16/2007  . Asthma 06/16/2007  . GERD 06/16/2007  . OSTEOPENIA 06/16/2007   Cameron Sprang, PT, MPT [facility] [address] [place], [place], [address] Phone: [phone]   Fax:  [phone] 09/15/2015, 6:03 PM   Name: Kristine Francis MRN: [id number] Date of Birth: July 20, 1941

## 2015-09-18 ENCOUNTER — Encounter: Payer: Self-pay | Admitting: Adult Health

## 2015-09-18 ENCOUNTER — Encounter: Payer: Self-pay | Admitting: Rehabilitation

## 2015-09-18 ENCOUNTER — Ambulatory Visit (INDEPENDENT_AMBULATORY_CARE_PROVIDER_SITE_OTHER): Payer: Medicare Other | Admitting: Adult Health

## 2015-09-18 ENCOUNTER — Ambulatory Visit: Payer: Medicare Other | Admitting: Rehabilitation

## 2015-09-18 VITALS — BP 130/80 | HR 68 | Temp 98.9°F | Ht 59.0 in | Wt 89.5 lb

## 2015-09-18 DIAGNOSIS — R300 Dysuria: Secondary | ICD-10-CM | POA: Diagnosis not present

## 2015-09-18 DIAGNOSIS — R2681 Unsteadiness on feet: Secondary | ICD-10-CM

## 2015-09-18 DIAGNOSIS — R531 Weakness: Secondary | ICD-10-CM

## 2015-09-18 DIAGNOSIS — R269 Unspecified abnormalities of gait and mobility: Secondary | ICD-10-CM

## 2015-09-18 LAB — POCT URINALYSIS DIPSTICK
Bilirubin, UA: NEGATIVE
Glucose, UA: NEGATIVE
Ketones, UA: NEGATIVE
Nitrite, UA: NEGATIVE
Protein, UA: NEGATIVE
Spec Grav, UA: 1.015
Urobilinogen, UA: 0.2
pH, UA: 7

## 2015-09-18 MED ORDER — CIPROFLOXACIN HCL 500 MG PO TABS: 500.0000 mg | ORAL_TABLET | Freq: Two times a day (BID) | ORAL | Status: DC

## 2015-09-18 NOTE — Progress Notes (Signed)
Pre visit review using our clinic review tool, if applicable. No additional management support is needed unless otherwise documented below in the visit note. 

## 2015-09-18 NOTE — Progress Notes (Signed)
PCP:FRY,STEPHEN A, MD Chief Complaint  Patient presents with  . Dysuria    Current Issues:  Presents with one day of dysuria, urinary urgency and urinary frequency Associated symptoms include:  urinary incontinence  There is a previous history of of similar symptoms. Was treated in April and September of this year for UTI Sexually active:  No   No concern for STI.  Prior to Admission medications   Medication Sig Start Date End Date Taking? Authorizing Provider  APPLE CIDER VINEGAR PO Take by mouth daily.    Historical Provider, MD  aspirin 81 MG tablet Take 81 mg by mouth daily.      Historical Provider, MD  b complex vitamins tablet Take 1 tablet by mouth daily.    Historical Provider, MD  CALCIUM-MAGNESIUM-ZINC PO Take 1 tablet by mouth 3 (three) times daily.    Historical Provider, MD  Cholecalciferol (VITAMIN D PO) Take 1 capsule by mouth daily.    Historical Provider, MD  Glucosamine Sulfate 1000 MG TABS Take 1 tablet by mouth 2 (two) times daily.    Historical Provider, MD  Lysine 500 MG TABS Take by mouth daily.    Historical Provider, MD  Multiple Vitamins-Minerals (CENTRUM SILVER PO) Take 1 capsule by mouth daily.      Historical Provider, MD  Vinpocetine POWD 5 mg by Does not apply route 2 (two) times daily.    Historical Provider, MD  vitamin E 600 UNIT capsule Take 600 Units by mouth daily.      Historical Provider, MD    Review of Systems  PE:  BP 130/80 mmHg  Pulse 68  Temp(Src) 98.9 F (37.2 C) (Oral)  Ht 4\' 11"  (1.499 m)  Wt 89 lb 8 oz (40.597 kg)  BMI 18.07 kg/m2  SpO2 96% Constitutional: Does not appear in any acute distress.  Heart: Normal rate, normal rhythm. No murmurs, rubs, or gallobs Lungs: Clear to auscultation Back. No CVA tenderness Abdomen: No pain with palpation. No distension, no masses.    Results for orders placed or performed in visit on 08/24/15  Hemoglobin A1c  Result Value Ref Range   Hgb A1c MFr Bld 6.0 4.6 - 6.5 %  Folate   Result Value Ref Range   Folate >23.8 >5.9 ng/mL  Vitamin B12  Result Value Ref Range   Vitamin B-12 1326 (H) 211 - 911 pg/mL  RPR  Result Value Ref Range   RPR Ser Ql NON REAC NON REAC  SPEP & IFE with QIG  Result Value Ref Range   IgG (Immunoglobin G), Serum 924 690 - 1700 mg/dL   IgA 427 (H) 69 - 380 mg/dL   IgM, Serum 80 52 - 322 mg/dL   Immunofix Electr Int SEE NOTE    Total Protein, Serum Electrophoresis 6.9 6.1 - 8.1 g/dL   Albumin ELP 3.8 3.8 - 4.8 g/dL   Alpha-1-Globulin 0.4 (H) 0.2 - 0.3 g/dL   Alpha-2-Globulin 0.9 0.5 - 0.9 g/dL   Beta Globulin 0.5 0.4 - 0.6 g/dL   Beta 2 0.4 0.2 - 0.5 g/dL   Gamma Globulin 0.9 0.8 - 1.7 g/dL   Abnormal Protein Band1 NOT DET g/dL   SPE Interp. SEE NOTE    COMMENT (PROTEIN ELECTROPHOR) SEE NOTE    Abnormal Protein Band2 NOT DET g/dL   Abnormal Protein Band3 NOT DET g/dL  IFE, Urine (with Tot Prot)  Result Value Ref Range   Total Protein, Urine <4 (L) 5 - 24 mg/dL   Total Protein,  Urine-Ur/day NOT CALC <150 mg/day   Albumin, U DETECTED DETECTED   Alpha 1, Urine NONE DET NONE DET   Alpha 2, Urine NONE DET NONE DET   Beta, Urine NONE DET NONE DET   Gamma Globulin, Urine NONE DET NONE DET   Interpretation SEE NOTE     Assessment and Plan:    1. Dysuria - POCT urinalysis dipstick; Standing - Culture, Urine - POCT urinalysis dipstick - ciprofloxacin (CIPRO) 500 MG tablet; Take 1 tablet (500 mg total) by mouth 2 (two) times daily.  Dispense: 10 tablet; Refill: 0 - Follow up if symptoms have not resolved after taking abx

## 2015-09-18 NOTE — Patient Instructions (Signed)
Gaze Stabilization: Standing Feet Apart    Feet shoulder width apart, keeping eyes on target on wall __approx 4__ feet away, tilt head down 15-30 and move head side to side for __20__ seconds.  Do _2-3___ sessions per day.  Repeat using target on pattern background.  Copyright  VHI. All rights reserved.   Compensatory Strategies: Corrective Saccades    Stand in corner with chair in front of you, feet about shoulder width apart, Place target on wall approx 3' away on either side.   Move eyes to target first then head, stop in the middle before transitioning to opposite side.    Repeat sequence __10__ times per session. Do __2__ sessions per day.  Copyright  VHI. All rights reserved.   Healthy Back - Shoulder Roll    Stand straight with arms relaxed at sides. Roll shoulders backward continuously. Do _10___ times.   Copyright  VHI. All rights reserved.   Scapular Retraction: Bilateral    Place knot of band in doorway and make sure you hear the door latch.   Facing anchor, pull arms back, bringing shoulder blades together. Repeat __10__ times per set. Do __2__ sets per session. Do __2__ sessions per day.  http://orth.exer.us/177   Copyright  VHI. All rights reserved.

## 2015-09-18 NOTE — Patient Instructions (Signed)

## 2015-09-18 NOTE — Therapy (Signed)
[facility] [address] [place], [place], [address] Phone: [phone]   Fax:  [phone]  Physical Therapy Treatment  Patient Details  Name: ANJANNETTE GAUGER MRN: [id number] Date of Birth: 06-08-1941 Referring Provider: Alonza Bogus, MD  Encounter Date: 09/18/2015      PT End of Session - 09/18/15 1756    Visit Number 3   Number of Visits 17   Date for PT Re-Evaluation 11/07/15   Authorization Type MCR, G codes every 10th visit ([employer] secondary, no PTA)   PT Start Time 1355   PT Stop Time 1445   PT Time Calculation (min) 50 min   Activity Tolerance Patient tolerated treatment well   Behavior During Therapy [facility] for tasks assessed/performed      Past Medical History  Diagnosis Date  . Ataxia   . Emphysema   . Diverticulosis   . Renal cyst   . Osteopenia   . GERD (gastroesophageal reflux disease)   . Iron deficiency anemia, unspecified   . Kidney cysts 11/16/12    small cyst on left  . Nystagmus, end-position   . Gait abnormality 05/14/2013    Ms.Buccieri a patient of Dr. Linda Hedges presented first interpreter thousand 13 with a gait dysfunction, she also had an episodic confusion and Loss device. Exam found a mild nystagmus and she was referred to ophthalmology on 03-2812, brain MRI was normal nystagmus was a secondary diagnosis to vestibulitis, ear nose and throat has followed and had seen the patient. At the time of the nystagmus the patie  . Hepatic steatosis   . Allergy   . Asthma     sees Dr. Tiajuana Amass     Past Surgical History  Procedure Laterality Date  . Tonsillectomy    . Breast biopsy Bilateral 1970's  . Cervical spine surgery  2010  . Hernia repair  Oct '14-left, remote-right    years ago right; left done '14  . Cataract extraction Bilateral LT:02/14/13,RT:02/21/13  . Skin cancer excision  05/13/13    FACE, basal cell  . Eye surgery Bilateral 01/2013&02/2013    left then right    There were no  vitals filed for this visit.  Visit Diagnosis:  Unsteadiness  Abnormality of gait  Generalized weakness      Subjective Assessment - 09/18/15 1357    Subjective Reports that she has a UTI and has an MD appt following PT appt to get medication.     Pertinent History nystagmus for 7 years   Limitations Walking;House hold activities   Patient Stated Goals "I would like to walk without falling and walk straight."    Currently in Pain? No/denies              NMR:  Performed imaginary targeting with "A" approx 2-3 in front of her. Pt unable to perform without having double vision (stacked vision), therefore attempted to move target a little further away approx 4' and perform again.  Still finding increased difficulty keeping and getting target in focus, therefore transitioned to small ROM gaze stabilization.  Performed x 15 reps with VERY small ROM in order to keep target in focus.  Also initiated compensatory saccades side to side (with stop in the middle) with eyes then head movement x 15 reps each direction.  Pt able to perform very well and was able to keep targets in focus.   Ended NMR with standing on foam in corner while performing compensatory saccades again with stop in the middle.  Pt initially very unsteady but was able to increase stability during exercise.  Pt reporting that she has HEP from previous episode of care, therefore educated her to bring in during next session to modify as necessary.    Therex: Performed shoulder circles x 2 sets of 10 reps with focus on rolling shoulders posteriorly to increase pectoral, SCM, levator and trapezius flexibility as well as provided pt with red theraband with education on how to stabilize at home while performing scapular retraction (rows).  Performed x 15 reps with cues for technique and upright posture with decreased trunk extension.                     PT Education - 09/18/15 1756    Education provided Yes   Education  Details Education on HEP, see pt instruction          PT Short Term Goals - 09/08/15 1942    PT SHORT TERM GOAL #1   Title Pt will initiate HEP for vestibular compensation and balance to decrease fall risk.  (Target Date: 10/06/15)   Time 4   Period Weeks   PT SHORT TERM GOAL #2   Title Pt will independently verbalize strategies to compensate for vestibular hypofunction in order to decrease fall risk.  (Target Date: 10/06/15)   Time 4   Period Weeks   PT SHORT TERM GOAL #3   Title Pt will increase DGI to 17/24 in order to indicate functional improvement in balance and decreased fall risk. (Target Date: 10/06/15)   Time 4   Period Weeks   PT SHORT TERM GOAL #4   Title Pt will negotiate in mildly busy environment without AD at S level without overt LOB to demonstrate safe introduction to community.  (Target Date: 10/06/15)   PT SHORT TERM GOAL #5   Title Will provide pt with [place] in order to assess the self-perceived handicapping effects imposed by pts vestibular involvement.  (Target Date: 10/06/15)           PT Long Term Goals - 09/08/15 1952    PT LONG TERM GOAL #1   Title Pt will be independent with HEP for vestibular deficits and balance to indicate decrease fall risk.  (Target date: 11/03/15)   Time 4   Period Weeks   PT LONG TERM GOAL #2   Title Pt will increase DGI to 20/24 to indicate decreased fall risk and improved functional balance.  (Target date: 11/03/15)   Time 4   Period Weeks   PT LONG TERM GOAL #3   Title Pt will demonstrate ability to ambulate in community setting (stores, etc) with LRAD at mod I level to indicate safe community negotiation.   (Target date: 11/03/15)   Time 4   Period Weeks   PT LONG TERM GOAL #4   Title Pt will improve DHI by 17 points in order to indicate improved self-perceived handicapping effects imposed by vestibular deficits.  (Target date: 11/03/15)   Time 4   Period Weeks               Plan - 09/18/15 1757    Clinical  Impression Statement Skilled session focused on vestibular compensation and slowly working on adaptation for vestibular deficits.  Also address cervical flexibility and postural control during session and provided pt with HEP reflecting these exercises.     Pt will benefit from skilled therapeutic intervention in order to improve on the following deficits Abnormal gait;Decreased balance;Decreased coordination;Decreased mobility;Difficulty walking;Dizziness;Impaired  perceived functional ability;Impaired vision/preception;Postural dysfunction   Rehab Potential Good   PT Frequency 2x / week   PT Duration 8 weeks   PT Treatment/Interventions ADLs/Self Care Home Management;Canalith Repostioning;Gait training;Stair training;Functional mobility training;Therapeutic activities;Therapeutic exercise;Balance training;Neuromuscular re-education;Patient/family education;Vestibular;Visual/perceptual remediation/compensation   PT Next Visit Plan give [facility]!!, vestibular compensation and slowly work towards adaptation.    Consulted and Agree with Plan of Care Patient        Problem List Patient Active Problem List   Diagnosis Date Noted  . Hyperlipidemia 04/24/2015  . Dysuria 02/11/2015  . Acute cystitis without hematuria 02/11/2015  . Chest pain 02/06/2015  . Elevated BP 02/06/2015  . Left inguinal hernia 08/02/2013  . Gait abnormality 05/14/2013  . Gait disorder   . Nystagmus, end-position   . Routine health maintenance 12/13/2011  . LEG CRAMPS, NOCTURNAL 12/07/2010  . CALLUS, TOE 03/19/2010  . hip pain 03/19/2010  . ATAXIA 11/17/2008  . EMPHYSEMA 10/21/2008  . CERVICAL RADICULOPATHY, RIGHT 10/21/2008  . DIVERTICULOSIS OF COLON 08/14/2008  . RENAL CYST 08/14/2008  . ANEMIA-IRON DEFICIENCY 06/16/2007  . Asthma 06/16/2007  . GERD 06/16/2007  . OSTEOPENIA 06/16/2007   Cameron Sprang, PT, MPT [facility] [address] [place], [place], [address] Phone:  [phone]   Fax:  [phone] 09/18/2015, 5:59 PM  Name: TREVOR DUTY MRN: [id number] Date of Birth: [birth year]

## 2015-09-21 ENCOUNTER — Encounter: Payer: Self-pay | Admitting: Rehabilitation

## 2015-09-21 ENCOUNTER — Ambulatory Visit: Payer: Medicare Other | Admitting: Rehabilitation

## 2015-09-21 DIAGNOSIS — R531 Weakness: Secondary | ICD-10-CM

## 2015-09-21 DIAGNOSIS — R2681 Unsteadiness on feet: Secondary | ICD-10-CM

## 2015-09-21 DIAGNOSIS — R269 Unspecified abnormalities of gait and mobility: Secondary | ICD-10-CM

## 2015-09-21 LAB — URINE CULTURE: Colony Count: 25000

## 2015-09-21 NOTE — Therapy (Signed)
[facility] [address] [place], [place], [address] Phone: [phone]   Fax:  [phone]  Physical Therapy Treatment  Patient Details  Name: Kristine Francis MRN: [id number] Date of Birth: 1940/11/22 Referring Provider: Alonza Bogus, MD  Encounter Date: 09/21/2015      PT End of Session - 09/21/15 1939    Visit Number 4   Number of Visits 17   Date for PT Re-Evaluation 11/07/15   Authorization Type MCR, G codes every 10th visit ([employer] secondary, no PTA)   PT Start Time 1402   PT Stop Time 1445   PT Time Calculation (min) 43 min   Activity Tolerance Patient tolerated treatment well   Behavior During Therapy [facility] for tasks assessed/performed      Past Medical History  Diagnosis Date  . Ataxia   . Emphysema   . Diverticulosis   . Renal cyst   . Osteopenia   . GERD (gastroesophageal reflux disease)   . Iron deficiency anemia, unspecified   . Kidney cysts 11/16/12    small cyst on left  . Nystagmus, end-position   . Gait abnormality 05/14/2013    Ms.Sherrill a patient of Dr. Linda Hedges presented first interpreter thousand 13 with a gait dysfunction, she also had an episodic confusion and Loss device. Exam found a mild nystagmus and she was referred to ophthalmology on 03-2812, brain MRI was normal nystagmus was a secondary diagnosis to vestibulitis, ear nose and throat has followed and had seen the patient. At the time of the nystagmus the patie  . Hepatic steatosis   . Allergy   . Asthma     sees Dr. Tiajuana Amass     Past Surgical History  Procedure Laterality Date  . Tonsillectomy    . Breast biopsy Bilateral 1970's  . Cervical spine surgery  2010  . Hernia repair  Oct '14-left, remote-right    years ago right; left done '14  . Cataract extraction Bilateral LT:02/14/13,RT:02/21/13  . Skin cancer excision  05/13/13    FACE, basal cell  . Eye surgery Bilateral 01/2013&02/2013    left then right    There were no  vitals filed for this visit.  Visit Diagnosis:  Unsteadiness  Abnormality of gait  Generalized weakness      Subjective Assessment - 09/21/15 1405    Subjective Reports no changes since last visit.  Reports near fall over the weekend.     Pertinent History nystagmus for 7 years   Limitations Walking;House hold activities   Patient Stated Goals "I would like to walk without falling and walk straight."    Currently in Pain? No/denies             NMR:  Pt with many questions regarding HEP and was performing gaze and saccade exercises inappropriately, therefore re-educated and performed all x 2 sets of 20 seconds.  Educated on small ROM, however did educate to keep increasing pace of head movements as she was able to keep targets stable.  Added and performed vertical gaze stabilization as well to exercises with verbal cues as well as modifications made to wording on current HEP so that pt able to perform appropriately at home.  Progressed to working with compensatory saccades with targets approx 4' away moving eyes then head to target.  Following better understanding of HEP, worked on gait while targeting.  Pt continues to demonstrate oscillopsia during tasks, therefore increased size of target and had pt ambulate with target (holding in front  of face). Pt able to perform at S level however cues for slower gait speed.  She was then able to state that target did not "bounce."  During rest breaks, pt performed shoulder circles and seated rows (without band) to further address postural deficits.                      PT Education - 09/21/15 1939    Education provided Yes   Education Details Education on correct way to perform HEP.    Person(s) Educated Patient   Methods Explanation   Comprehension Verbalized understanding          PT Short Term Goals - 09/08/15 1942    PT SHORT TERM GOAL #1   Title Pt will initiate HEP for vestibular compensation and balance to  decrease fall risk.  (Target Date: 10/06/15)   Time 4   Period Weeks   PT SHORT TERM GOAL #2   Title Pt will independently verbalize strategies to compensate for vestibular hypofunction in order to decrease fall risk.  (Target Date: 10/06/15)   Time 4   Period Weeks   PT SHORT TERM GOAL #3   Title Pt will increase DGI to 17/24 in order to indicate functional improvement in balance and decreased fall risk. (Target Date: 10/06/15)   Time 4   Period Weeks   PT SHORT TERM GOAL #4   Title Pt will negotiate in mildly busy environment without AD at S level without overt LOB to demonstrate safe introduction to community.  (Target Date: 10/06/15)   PT SHORT TERM GOAL #5   Title Will provide pt with [place] in order to assess the self-perceived handicapping effects imposed by pts vestibular involvement.  (Target Date: 10/06/15)           PT Long Term Goals - 09/08/15 1952    PT LONG TERM GOAL #1   Title Pt will be independent with HEP for vestibular deficits and balance to indicate decrease fall risk.  (Target date: 11/03/15)   Time 4   Period Weeks   PT LONG TERM GOAL #2   Title Pt will increase DGI to 20/24 to indicate decreased fall risk and improved functional balance.  (Target date: 11/03/15)   Time 4   Period Weeks   PT LONG TERM GOAL #3   Title Pt will demonstrate ability to ambulate in community setting (stores, etc) with LRAD at mod I level to indicate safe community negotiation.   (Target date: 11/03/15)   Time 4   Period Weeks   PT LONG TERM GOAL #4   Title Pt will improve DHI by 17 points in order to indicate improved self-perceived handicapping effects imposed by vestibular deficits.  (Target date: 11/03/15)   Time 4   Period Weeks               Plan - 09/21/15 1940    Clinical Impression Statement Skilled session continued to focus on vestibular compensation with standing exericses working to gait during task. Pt also requires mod education on current HEP with  performance of HEP and addition of vertical gaze stabilization exercises.     Pt will benefit from skilled therapeutic intervention in order to improve on the following deficits Abnormal gait;Decreased balance;Decreased coordination;Decreased mobility;Difficulty walking;Dizziness;Impaired perceived functional ability;Impaired vision/preception;Postural dysfunction   Rehab Potential Good   PT Frequency 2x / week   PT Duration 8 weeks   PT Treatment/Interventions ADLs/Self Care Home Management;Canalith Repostioning;Gait training;Stair training;Functional mobility training;Therapeutic  activities;Therapeutic exercise;Balance training;Neuromuscular re-education;Patient/family education;Vestibular;Visual/perceptual remediation/compensation   PT Next Visit Plan give [facility]!!, vestibular compensation and slowly work towards adaptation.    Consulted and Agree with Plan of Care Patient        Problem List Patient Active Problem List   Diagnosis Date Noted  . Hyperlipidemia 04/24/2015  . Dysuria 02/11/2015  . Acute cystitis without hematuria 02/11/2015  . Chest pain 02/06/2015  . Elevated BP 02/06/2015  . Left inguinal hernia 08/02/2013  . Gait abnormality 05/14/2013  . Gait disorder   . Nystagmus, end-position   . Routine health maintenance 12/13/2011  . LEG CRAMPS, NOCTURNAL 12/07/2010  . CALLUS, TOE 03/19/2010  . hip pain 03/19/2010  . ATAXIA 11/17/2008  . EMPHYSEMA 10/21/2008  . CERVICAL RADICULOPATHY, RIGHT 10/21/2008  . DIVERTICULOSIS OF COLON 08/14/2008  . RENAL CYST 08/14/2008  . ANEMIA-IRON DEFICIENCY 06/16/2007  . Asthma 06/16/2007  . GERD 06/16/2007  . OSTEOPENIA 06/16/2007   Kristine Francis, PT, MPT [facility] [address] [place], [place], [address] Phone: [phone]   Fax:  [phone] 09/21/2015, 7:43 PM  Name: Kristine Francis MRN: [id number] Date of Birth: 05/08/1941

## 2015-09-25 ENCOUNTER — Ambulatory Visit: Payer: Medicare Other | Attending: Neurology | Admitting: Rehabilitation

## 2015-09-25 ENCOUNTER — Encounter: Payer: Self-pay | Admitting: Rehabilitation

## 2015-09-25 DIAGNOSIS — J3089 Other allergic rhinitis: Secondary | ICD-10-CM | POA: Diagnosis not present

## 2015-09-25 DIAGNOSIS — R531 Weakness: Secondary | ICD-10-CM | POA: Diagnosis not present

## 2015-09-25 DIAGNOSIS — R269 Unspecified abnormalities of gait and mobility: Secondary | ICD-10-CM | POA: Insufficient documentation

## 2015-09-25 DIAGNOSIS — R2681 Unsteadiness on feet: Secondary | ICD-10-CM | POA: Insufficient documentation

## 2015-09-25 DIAGNOSIS — J3081 Allergic rhinitis due to animal (cat) (dog) hair and dander: Secondary | ICD-10-CM | POA: Diagnosis not present

## 2015-09-25 DIAGNOSIS — J301 Allergic rhinitis due to pollen: Secondary | ICD-10-CM | POA: Diagnosis not present

## 2015-09-25 NOTE — Therapy (Signed)
[facility] [address] [place], [place], [address] Phone: [phone]   Fax:  [phone]  Physical Therapy Treatment  Patient Details  Name: Kristine Francis MRN: [id number] Date of Birth: Apr 08, 1941 Referring Provider: Alonza Bogus, MD  Encounter Date: 09/25/2015      PT End of Session - 09/25/15 1458    Visit Number 5   Number of Visits 17   Date for PT Re-Evaluation 11/07/15   Authorization Type MCR, G codes every 10th visit (T[employer]are secondary, no PTA)   PT Start Time 1454  pt late for appt   PT Stop Time 1530   PT Time Calculation (min) 36 min   Activity Tolerance Patient tolerated treatment well   Behavior During Therapy [facility] for tasks assessed/performed      Past Medical History  Diagnosis Date  . Ataxia   . Emphysema   . Diverticulosis   . Renal cyst   . Osteopenia   . GERD (gastroesophageal reflux disease)   . Iron deficiency anemia, unspecified   . Kidney cysts 11/16/12    small cyst on left  . Nystagmus, end-position   . Gait abnormality 05/14/2013    Ms.Milks a patient of Dr. Linda Hedges presented first interpreter thousand 13 with a gait dysfunction, she also had an episodic confusion and Loss device. Exam found a mild nystagmus and she was referred to ophthalmology on 03-2812, brain MRI was normal nystagmus was a secondary diagnosis to vestibulitis, ear nose and throat has followed and had seen the patient. At the time of the nystagmus the patie  . Hepatic steatosis   . Allergy   . Asthma     sees Dr. Tiajuana Amass     Past Surgical History  Procedure Laterality Date  . Tonsillectomy    . Breast biopsy Bilateral 1970's  . Cervical spine surgery  2010  . Hernia repair  Oct '14-left, remote-right    years ago right; left done '14  . Cataract extraction Bilateral LT:02/14/13,RT:02/21/13  . Skin cancer excision  05/13/13    FACE, basal cell  . Eye surgery Bilateral 01/2013&02/2013    left then right     There were no vitals filed for this visit.  Visit Diagnosis:  Unsteadiness  Abnormality of gait  Generalized weakness      Subjective Assessment - 09/25/15 1457    Subjective "I'm going to a soccer game this weekend."  "I did have a fall because I stepping on my dog's paw."     Pertinent History nystagmus for 7 years   Limitations Walking;House hold activities   Patient Stated Goals "I would like to walk without falling and walk straight."    Currently in Pain? No/denies               Therex:  Provided pt with additional postural exercises to work on thoracic extension, pectoral flexibility and cervical flexibility, see pt instruction for details.  Performed all stretches x 3 reps of 60 secs.   NMR:  Continue to address vestibular deficits with gait and gaze stabilization, decreasing size of target during gait and working on forwards/retro gait with focus on target.  Attempted to perform head turns (very small ROM), however pt with increased unsteadiness and decreased ability to keep target in focus.  Note pt very guarded during all gait, therefore provided therex as above.                    PT Education -  09/25/15 1825    Education provided Yes   Education Details Education on addition of postural exericses to [employer]) Educated Patient   Methods Explanation;Handout   Comprehension Verbalized understanding          PT Short Term Goals - 09/08/15 1942    PT SHORT TERM GOAL #1   Title Pt will initiate HEP for vestibular compensation and balance to decrease fall risk.  (Target Date: 10/06/15)   Time 4   Period Weeks   PT SHORT TERM GOAL #2   Title Pt will independently verbalize strategies to compensate for vestibular hypofunction in order to decrease fall risk.  (Target Date: 10/06/15)   Time 4   Period Weeks   PT SHORT TERM GOAL #3   Title Pt will increase DGI to 17/24 in order to indicate functional improvement in balance and decreased  fall risk. (Target Date: 10/06/15)   Time 4   Period Weeks   PT SHORT TERM GOAL #4   Title Pt will negotiate in mildly busy environment without AD at S level without overt LOB to demonstrate safe introduction to community.  (Target Date: 10/06/15)   PT SHORT TERM GOAL #5   Title Will provide pt with [place] in order to assess the self-perceived handicapping effects imposed by pts vestibular involvement.  (Target Date: 10/06/15)           PT Long Term Goals - 09/08/15 1952    PT LONG TERM GOAL #1   Title Pt will be independent with HEP for vestibular deficits and balance to indicate decrease fall risk.  (Target date: 11/03/15)   Time 4   Period Weeks   PT LONG TERM GOAL #2   Title Pt will increase DGI to 20/24 to indicate decreased fall risk and improved functional balance.  (Target date: 11/03/15)   Time 4   Period Weeks   PT LONG TERM GOAL #3   Title Pt will demonstrate ability to ambulate in community setting (stores, etc) with LRAD at mod I level to indicate safe community negotiation.   (Target date: 11/03/15)   Time 4   Period Weeks   PT LONG TERM GOAL #4   Title Pt will improve DHI by 17 points in order to indicate improved self-perceived handicapping effects imposed by vestibular deficits.  (Target date: 11/03/15)   Time 4   Period Weeks               Plan - 09/25/15 1458    Clinical Impression Statement Skilled session focused on increasing difficulty of gaze stability with gait.  Note that last visit, pt with no oscillopsia while holding larger target, therefore decreased size to increase demand/challenge on vestibular challenge.  Also had pt work on gait forwards/backwards with smaller target.  ended with postural exercises to increase thoacic extension, pectoral flexibilty and neck flexibility, see pt instruction.     Pt will benefit from skilled therapeutic intervention in order to improve on the following deficits Abnormal gait;Decreased balance;Decreased  coordination;Decreased mobility;Difficulty walking;Dizziness;Impaired perceived functional ability;Impaired vision/preception;Postural dysfunction   Rehab Potential Good   PT Frequency 2x / week   PT Duration 8 weeks   PT Treatment/Interventions ADLs/Self Care Home Management;Canalith Repostioning;Gait training;Stair training;Functional mobility training;Therapeutic activities;Therapeutic exercise;Balance training;Neuromuscular re-education;Patient/family education;Vestibular;Visual/perceptual remediation/compensation   PT Next Visit Plan vestibular compensation and slowly work towards adaptation.    Consulted and Agree with Plan of Care Patient        Problem List Patient Active Problem List  Diagnosis Date Noted  . Hyperlipidemia 04/24/2015  . Dysuria 02/11/2015  . Acute cystitis without hematuria 02/11/2015  . Chest pain 02/06/2015  . Elevated BP 02/06/2015  . Left inguinal hernia 08/02/2013  . Gait abnormality 05/14/2013  . Gait disorder   . Nystagmus, end-position   . Routine health maintenance 12/13/2011  . LEG CRAMPS, NOCTURNAL 12/07/2010  . CALLUS, TOE 03/19/2010  . hip pain 03/19/2010  . ATAXIA 11/17/2008  . EMPHYSEMA 10/21/2008  . CERVICAL RADICULOPATHY, RIGHT 10/21/2008  . DIVERTICULOSIS OF COLON 08/14/2008  . RENAL CYST 08/14/2008  . ANEMIA-IRON DEFICIENCY 06/16/2007  . Asthma 06/16/2007  . GERD 06/16/2007  . OSTEOPENIA 06/16/2007    Cameron Sprang, PT, MPT [facility] [address] [place], [place], [address] Phone: [phone]   Fax:  [phone] 09/25/2015, 6:30 PM  Name: SANTANNA WHITFORD MRN: [id number] Date of Birth: February 15, 1941

## 2015-09-25 NOTE — Patient Instructions (Signed)
Pectoralis Stretch: Supine (Towel Roll)    Lie with rolled towel under spine, letting shoulders relax toward floor. Stretch arms out to make a "T" shape.   Lie _1__ minutes, do 2 times. Do _2__ times per day.  http://ss.exer.us/355   Copyright  VHI. All rights reserved.   AROM: Lateral Neck Flexion    Slowly tilt head toward one shoulder, then the other. Hold each position __60__ seconds. Repeat __2__ times per set. Do __1__ sets per session. Do __2__ sessions per day.  http://orth.exer.us/297   Copyright  VHI. All rights reserved.

## 2015-09-28 ENCOUNTER — Ambulatory Visit: Payer: Medicare Other | Admitting: Rehabilitation

## 2015-09-29 ENCOUNTER — Ambulatory Visit: Payer: Medicare Other | Admitting: Physical Therapy

## 2015-09-29 DIAGNOSIS — R2681 Unsteadiness on feet: Secondary | ICD-10-CM

## 2015-09-29 DIAGNOSIS — R531 Weakness: Secondary | ICD-10-CM | POA: Diagnosis not present

## 2015-09-29 DIAGNOSIS — R269 Unspecified abnormalities of gait and mobility: Secondary | ICD-10-CM

## 2015-09-29 NOTE — Therapy (Signed)
[facility] [address] [place], [place], [address] Phone: [phone]   Fax:  [phone]  Physical Therapy Treatment  Patient Details  Name: Kristine Francis MRN: [id number] Date of Birth: 1941-09-23 Referring Provider: Alonza Bogus, MD  Encounter Date: 09/29/2015      PT End of Session - 09/29/15 1447    Visit Number 6   Number of Visits 17   PT Start Time 7673   PT Stop Time 4193   PT Time Calculation (min) 45 min      Past Medical History  Diagnosis Date  . Ataxia   . Emphysema   . Diverticulosis   . Renal cyst   . Osteopenia   . GERD (gastroesophageal reflux disease)   . Iron deficiency anemia, unspecified   . Kidney cysts 11/16/12    small cyst on left  . Nystagmus, end-position   . Gait abnormality 05/14/2013    Ms.Nicole a patient of Dr. Linda Hedges presented first interpreter thousand 13 with a gait dysfunction, she also had an episodic confusion and Loss device. Exam found a mild nystagmus and she was referred to ophthalmology on 03-2812, brain MRI was normal nystagmus was a secondary diagnosis to vestibulitis, ear nose and throat has followed and had seen the patient. At the time of the nystagmus the patie  . Hepatic steatosis   . Allergy   . Asthma     sees Dr. Tiajuana Amass     Past Surgical History  Procedure Laterality Date  . Tonsillectomy    . Breast biopsy Bilateral 1970's  . Cervical spine surgery  2010  . Hernia repair  Oct '14-left, remote-right    years ago right; left done '14  . Cataract extraction Bilateral LT:02/14/13,RT:02/21/13  . Skin cancer excision  05/13/13    FACE, basal cell  . Eye surgery Bilateral 01/2013&02/2013    left then right    There were no vitals filed for this visit.  Visit Diagnosis:  Unsteadiness  Abnormality of gait  Generalized weakness      Subjective Assessment - 09/29/15 1411    Subjective I'm not seeing double vision as much as I was; at home she's  doing AROM for neck; and UE resistance band for upright row; arom for shoulder;  and gaze stabilization ex's;  she denies every having any spinning sensatoin vertigo;  MD told her there is a veritical vertigo which would explain the trees moving up and down when she walks              NME;  Standing head mvt side to side x 20 and up and down x20  Looking at stationary object;  Sit to stand (vertical head mvt) with eyes fixed on object.  30 second sit to stand test;  12 reps   Standing; feet together ; eyes closed (looking at imaginary object) moving head side to side;    Feet tandem  Eyes open  Then eyes closed with light UE support ; head mvt  x20 side to side Standing tandem ; w/o Ue support x 2 min   MOD CTSIB;  3/4   30,30,30, 2  Poor when isolated to just inner ear for stability.   Multi side lying from sit x 5 bouts; no dizziness but objects on the wall seem to move somewhat                       PT Education - 09/29/15 1446  Education provided Yes   Education Details with HEP ; work on stim of inner ear with ex's ;  narrow base; pillow ; eyes open/closed   Person(s) Educated Patient   Comprehension Verbalized understanding          PT Short Term Goals - 09/08/15 1942    PT SHORT TERM GOAL #1   Title Pt will initiate HEP for vestibular compensation and balance to decrease fall risk.  (Target Date: 10/06/15)   Time 4   Period Weeks   PT SHORT TERM GOAL #2   Title Pt will independently verbalize strategies to compensate for vestibular hypofunction in order to decrease fall risk.  (Target Date: 10/06/15)   Time 4   Period Weeks   PT SHORT TERM GOAL #3   Title Pt will increase DGI to 17/24 in order to indicate functional improvement in balance and decreased fall risk. (Target Date: 10/06/15)   Time 4   Period Weeks   PT SHORT TERM GOAL #4   Title Pt will negotiate in mildly busy environment without AD at S level without overt LOB to demonstrate safe  introduction to community.  (Target Date: 10/06/15)   PT SHORT TERM GOAL #5   Title Will provide pt with [employer] in order to assess the self-perceived handicapping effects imposed by pts vestibular involvement.  (Target Date: 10/06/15)           PT Long Term Goals - 09/08/15 1952    PT LONG TERM GOAL #1   Title Pt will be independent with HEP for vestibular deficits and balance to indicate decrease fall risk.  (Target date: 11/03/15)   Time 4   Period Weeks   PT LONG TERM GOAL #2   Title Pt will increase DGI to 20/24 to indicate decreased fall risk and improved functional balance.  (Target date: 11/03/15)   Time 4   Period Weeks   PT LONG TERM GOAL #3   Title Pt will demonstrate ability to ambulate in community setting (stores, etc) with LRAD at mod I level to indicate safe community negotiation.   (Target date: 11/03/15)   Time 4   Period Weeks   PT LONG TERM GOAL #4   Title Pt will improve DHI by 17 points in order to indicate improved self-perceived handicapping effects imposed by vestibular deficits.  (Target date: 11/03/15)   Time 4   Period Weeks               Plan - 09/29/15 1447    Clinical Impression Statement Pt has poor balance resposnes when inner ear is only source of balance feedback for brain; no vertigo or adverse s/s with any of the ex's we did today; no nausea; she felt good after ex's;  need to progress step reflex ex's and continue with visual challenges during balance ex's   PT Next Visit Plan continue w/ vestib focus        Problem List Patient Active Problem List   Diagnosis Date Noted  . Hyperlipidemia 04/24/2015  . Dysuria 02/11/2015  . Acute cystitis without hematuria 02/11/2015  . Chest pain 02/06/2015  . Elevated BP 02/06/2015  . Left inguinal hernia 08/02/2013  . Gait abnormality 05/14/2013  . Gait disorder   . Nystagmus, end-position   . Routine health maintenance 12/13/2011  . LEG CRAMPS, NOCTURNAL 12/07/2010  . CALLUS, TOE  03/19/2010  . hip pain 03/19/2010  . ATAXIA 11/17/2008  . EMPHYSEMA 10/21/2008  . CERVICAL RADICULOPATHY, RIGHT 10/21/2008  .  DIVERTICULOSIS OF COLON 08/14/2008  . RENAL CYST 08/14/2008  . ANEMIA-IRON DEFICIENCY 06/16/2007  . Asthma 06/16/2007  . GERD 06/16/2007  . OSTEOPENIA 06/16/2007    Rosaura Carpenter D PT DPT 09/29/2015, 2:50 PM  [facility] [address] [place], [place], [address] Phone: [phone]   Fax:  [phone]  Name: Kristine Francis MRN: [id number] Date of Birth: [birth year]

## 2015-10-02 ENCOUNTER — Ambulatory Visit: Payer: Medicare Other | Admitting: Rehabilitative and Restorative Service Providers"

## 2015-10-02 DIAGNOSIS — R531 Weakness: Secondary | ICD-10-CM | POA: Diagnosis not present

## 2015-10-02 DIAGNOSIS — R2681 Unsteadiness on feet: Secondary | ICD-10-CM | POA: Diagnosis not present

## 2015-10-02 DIAGNOSIS — R269 Unspecified abnormalities of gait and mobility: Secondary | ICD-10-CM | POA: Diagnosis not present

## 2015-10-04 NOTE — Therapy (Signed)
[facility] [address] [place], [place], [address] Phone: [phone]   Fax:  [phone]  Physical Therapy Treatment  Patient Details  Name: Kristine Francis MRN: [id number] Date of Birth: 14-Jul-1941 Referring Provider: Alonza Bogus, MD  Encounter Date: 10/02/2015      PT End of Session - 10/04/15 2138    Visit Number 7   Number of Visits 17   Date for PT Re-Evaluation 11/07/15   Authorization Type MCR, G codes every 10th visit ([employer] secondary, no PTA)   PT Start Time 1405   PT Stop Time 1450   PT Time Calculation (min) 45 min   Activity Tolerance Patient tolerated treatment well   Behavior During Therapy [facility] for tasks assessed/performed      Past Medical History  Diagnosis Date  . Ataxia   . Emphysema   . Diverticulosis   . Renal cyst   . Osteopenia   . GERD (gastroesophageal reflux disease)   . Iron deficiency anemia, unspecified   . Kidney cysts 11/16/12    small cyst on left  . Nystagmus, end-position   . Gait abnormality 05/14/2013    Ms.Thissell a patient of Dr. Linda Hedges presented first interpreter thousand 13 with a gait dysfunction, she also had an episodic confusion and Loss device. Exam found a mild nystagmus and she was referred to ophthalmology on 03-2812, brain MRI was normal nystagmus was a secondary diagnosis to vestibulitis, ear nose and throat has followed and had seen the patient. At the time of the nystagmus the patie  . Hepatic steatosis   . Allergy   . Asthma     sees Dr. Tiajuana Amass     Past Surgical History  Procedure Laterality Date  . Tonsillectomy    . Breast biopsy Bilateral 1970's  . Cervical spine surgery  2010  . Hernia repair  Oct '14-left, remote-right    years ago right; left done '14  . Cataract extraction Bilateral LT:02/14/13,RT:02/21/13  . Skin cancer excision  05/13/13    FACE, basal cell  . Eye surgery Bilateral 01/2013&02/2013    left then right    There were no  vitals filed for this visit.  Visit Diagnosis:  Abnormality of gait  Generalized weakness      Subjective Assessment - 10/04/15 2137    Subjective The patient reports no falls in the past few days.    The patient feels that she is able to turn her head further since beginning therapy.  She is working on her balance with HEP.     Patient Stated Goals "I would like to walk without falling and walk straight."    Currently in Pain? No/denies      NEUROMUSCULAR RE-EDUCATION: Visual tracking in seated, then standing Compensatory strategies in standing for turns beginning with quarter turns with eyes+head moving, stabilizing on a target, then turning her body> progressed to do full 360 degree turns in quarter segments with visual cues on technique and SBA to CGA for safety  Corner balance exercises consisting of: Standing on level surface with feet apart>progressing to together with eyes open, then closed with CGA to min A for safety. Eyes open with stride position in corner  Gaze activities in small ROM seated Head turns in seated with eyes and head with patient reporting greater ROM  Gait: Ambulation with 180 degree turn with visual spotting Gait with verbal cues to slow speed to gain greater control with SBA on level surfaces x 230 feet,  100 feet x 3 reps       PT Short Term Goals - 09/08/15 1942    PT SHORT TERM GOAL #1   Title Pt will initiate HEP for vestibular compensation and balance to decrease fall risk.  (Target Date: 10/06/15)   Time 4   Period Weeks   PT SHORT TERM GOAL #2   Title Pt will independently verbalize strategies to compensate for vestibular hypofunction in order to decrease fall risk.  (Target Date: 10/06/15)   Time 4   Period Weeks   PT SHORT TERM GOAL #3   Title Pt will increase DGI to 17/24 in order to indicate functional improvement in balance and decreased fall risk. (Target Date: 10/06/15)   Time 4   Period Weeks   PT SHORT TERM GOAL #4   Title Pt  will negotiate in mildly busy environment without AD at S level without overt LOB to demonstrate safe introduction to community.  (Target Date: 10/06/15)   PT SHORT TERM GOAL #5   Title Will provide pt with [employer] in order to assess the self-perceived handicapping effects imposed by pts vestibular involvement.  (Target Date: 10/06/15)           PT Long Term Goals - 09/08/15 1952    PT LONG TERM GOAL #1   Title Pt will be independent with HEP for vestibular deficits and balance to indicate decrease fall risk.  (Target date: 11/03/15)   Time 4   Period Weeks   PT LONG TERM GOAL #2   Title Pt will increase DGI to 20/24 to indicate decreased fall risk and improved functional balance.  (Target date: 11/03/15)   Time 4   Period Weeks   PT LONG TERM GOAL #3   Title Pt will demonstrate ability to ambulate in community setting (stores, etc) with LRAD at mod I level to indicate safe community negotiation.   (Target date: 11/03/15)   Time 4   Period Weeks   PT LONG TERM GOAL #4   Title Pt will improve DHI by 17 points in order to indicate improved self-perceived handicapping effects imposed by vestibular deficits.  (Target date: 11/03/15)   Time 4   Period Weeks               Plan - 10/04/15 2140    Clinical Impression Statement The patient is reporting improved visual focus during gait.  She notes imbalance when outdoors on unlevel surfaces.  PT continuing to progress visual compensation and substitution strategies due to diminished use of vestibular inputs for functional balance.     PT Next Visit Plan Continue with visual spotting during turns and gait, unlevel surfaces with compensatory strategies (wide base, visual spotting, flashlights at night, etc), progress visual tracking, gaze as able   Consulted and Agree with Plan of Care Patient        Problem List Patient Active Problem List   Diagnosis Date Noted  . Hyperlipidemia 04/24/2015  . Dysuria 02/11/2015  . Acute cystitis  without hematuria 02/11/2015  . Chest pain 02/06/2015  . Elevated BP 02/06/2015  . Left inguinal hernia 08/02/2013  . Gait abnormality 05/14/2013  . Gait disorder   . Nystagmus, end-position   . Routine health maintenance 12/13/2011  . LEG CRAMPS, NOCTURNAL 12/07/2010  . CALLUS, TOE 03/19/2010  . hip pain 03/19/2010  . ATAXIA 11/17/2008  . EMPHYSEMA 10/21/2008  . CERVICAL RADICULOPATHY, RIGHT 10/21/2008  . DIVERTICULOSIS OF COLON 08/14/2008  . RENAL CYST 08/14/2008  . ANEMIA-IRON  DEFICIENCY 06/16/2007  . Asthma 06/16/2007  . GERD 06/16/2007  . OSTEOPENIA 06/16/2007    Chelly Dombeck, PT 10/04/2015, 9:47 PM  [facility] [address] [place], [place], [address] Phone: [phone]   Fax:  [phone]  Name: Kristine Francis MRN: [id number] Date of Birth: [birth year]

## 2015-10-05 ENCOUNTER — Ambulatory Visit: Payer: Medicare Other | Admitting: Rehabilitation

## 2015-10-05 ENCOUNTER — Encounter: Payer: Self-pay | Admitting: Rehabilitation

## 2015-10-05 DIAGNOSIS — R2681 Unsteadiness on feet: Secondary | ICD-10-CM | POA: Diagnosis not present

## 2015-10-05 DIAGNOSIS — R269 Unspecified abnormalities of gait and mobility: Secondary | ICD-10-CM | POA: Diagnosis not present

## 2015-10-05 DIAGNOSIS — R531 Weakness: Secondary | ICD-10-CM | POA: Diagnosis not present

## 2015-10-05 NOTE — Therapy (Signed)
[facility] [address] [place], [place], [address] Phone: [phone]   Fax:  [phone]  Physical Therapy Treatment  Patient Details  Name: Kristine Francis MRN: [id number] Date of Birth: 1941/04/06 Referring Provider: Alonza Bogus, MD  Encounter Date: 10/05/2015      PT End of Session - 10/04/15 2138    Visit Number 7   Number of Visits 17   Date for PT Re-Evaluation 11/07/15   Authorization Type MCR, G codes every 10th visit ([employer] secondary, no PTA)   PT Start Time 1405   PT Stop Time 1450   PT Time Calculation (min) 45 min   Activity Tolerance Patient tolerated treatment well   Behavior During Therapy [facility] for tasks assessed/performed      Past Medical History  Diagnosis Date  . Ataxia   . Emphysema   . Diverticulosis   . Renal cyst   . Osteopenia   . GERD (gastroesophageal reflux disease)   . Iron deficiency anemia, unspecified   . Kidney cysts 11/16/12    small cyst on left  . Nystagmus, end-position   . Gait abnormality 05/14/2013    Ms.Celestine a patient of Dr. Linda Hedges presented first interpreter thousand 13 with a gait dysfunction, she also had an episodic confusion and Loss device. Exam found a mild nystagmus and she was referred to ophthalmology on 03-2812, brain MRI was normal nystagmus was a secondary diagnosis to vestibulitis, ear nose and throat has followed and had seen the patient. At the time of the nystagmus the patie  . Hepatic steatosis   . Allergy   . Asthma     sees Dr. Tiajuana Amass     Past Surgical History  Procedure Laterality Date  . Tonsillectomy    . Breast biopsy Bilateral 1970's  . Cervical spine surgery  2010  . Hernia repair  Oct '14-left, remote-right    years ago right; left done '14  . Cataract extraction Bilateral LT:02/14/13,RT:02/21/13  . Skin cancer excision  05/13/13    FACE, basal cell  . Eye surgery Bilateral 01/2013&02/2013    left then right    There were no  vitals filed for this visit.  Visit Diagnosis:  No diagnosis found.      Subjective Assessment - 10/05/15 1410    Subjective "I've been busy this week, my husband fell and had to go to the ED then I had to take him to [facility] today."     Pertinent History nystagmus for 7 years   Limitations Walking;House hold activities   Patient Stated Goals "I would like to walk without falling and walk straight."    Currently in Pain? No/denies                NMR:  Performed DGI to assess STG's.  Note pt has improved only slightly from 14/24 to 15/24.  She is still very limited with head turns during gait as well as negotiation over obstacles and around obstacles.  Discussed meaning of results with pt as well as the fact will continue to work on this with her.     Self Care:  Had lengthy discussion with pt regarding use of compensatory strategies in community and home setting to ensure safety and prevent falls.  Pt able to state her increased awareness and ability to complete HEP, however requires some cues for actual strategies in community.  Once cued, pt then able to state what she does in community as far as  using eyes/head/body with turns and avoiding making full turns to avoid falls.  Also discussed HEP and importance of performing daily.  Pt verbalized understanding and states that she continues to do them, however had not done the pectoral stretch or exercise given during last visit as she was unable to have time in day to complete.                     PT Short Term Goals - 09/08/15 1942    PT SHORT TERM GOAL #1   Title Pt will initiate HEP for vestibular compensation and balance to decrease fall risk.  (Target Date: 10/06/15)   Time 4   Period Weeks   PT SHORT TERM GOAL #2   Title Pt will independently verbalize strategies to compensate for vestibular hypofunction in order to decrease fall risk.  (Target Date: 10/06/15)   Time 4   Period Weeks   PT SHORT  TERM GOAL #3   Title Pt will increase DGI to 17/24 in order to indicate functional improvement in balance and decreased fall risk. (Target Date: 10/06/15)   Time 4   Period Weeks   PT SHORT TERM GOAL #4   Title Pt will negotiate in mildly busy environment without AD at S level without overt LOB to demonstrate safe introduction to community.  (Target Date: 10/06/15)   PT SHORT TERM GOAL #5   Title Will provide pt with [place] in order to assess the self-perceived handicapping effects imposed by pts vestibular involvement.  (Target Date: 10/06/15)           PT Long Term Goals - 09/08/15 1952    PT LONG TERM GOAL #1   Title Pt will be independent with HEP for vestibular deficits and balance to indicate decrease fall risk.  (Target date: 11/03/15)   Time 4   Period Weeks   PT LONG TERM GOAL #2   Title Pt will increase DGI to 20/24 to indicate decreased fall risk and improved functional balance.  (Target date: 11/03/15)   Time 4   Period Weeks   PT LONG TERM GOAL #3   Title Pt will demonstrate ability to ambulate in community setting (stores, etc) with LRAD at mod I level to indicate safe community negotiation.   (Target date: 11/03/15)   Time 4   Period Weeks   PT LONG TERM GOAL #4   Title Pt will improve DHI by 17 points in order to indicate improved self-perceived handicapping effects imposed by vestibular deficits.  (Target date: 11/03/15)   Time 4   Period Weeks               Plan - 10/04/15 2140    Clinical Impression Statement The patient is reporting improved visual focus during gait.  She notes imbalance when outdoors on unlevel surfaces.  PT continuing to progress visual compensation and substitution strategies due to diminished use of vestibular inputs for functional balance.     PT Next Visit Plan Continue with visual spotting during turns and gait, unlevel surfaces with compensatory strategies (wide base, visual spotting, flashlights at night, etc), progress visual  tracking, gaze as able   Consulted and Agree with Plan of Care Patient        Problem List Patient Active Problem List   Diagnosis Date Noted  . Hyperlipidemia 04/24/2015  . Dysuria 02/11/2015  . Acute cystitis without hematuria 02/11/2015  . Chest pain 02/06/2015  . Elevated BP 02/06/2015  . Left inguinal hernia  08/02/2013  . Gait abnormality 05/14/2013  . Gait disorder   . Nystagmus, end-position   . Routine health maintenance 12/13/2011  . LEG CRAMPS, NOCTURNAL 12/07/2010  . CALLUS, TOE 03/19/2010  . hip pain 03/19/2010  . ATAXIA 11/17/2008  . EMPHYSEMA 10/21/2008  . CERVICAL RADICULOPATHY, RIGHT 10/21/2008  . DIVERTICULOSIS OF COLON 08/14/2008  . RENAL CYST 08/14/2008  . ANEMIA-IRON DEFICIENCY 06/16/2007  . Asthma 06/16/2007  . GERD 06/16/2007  . OSTEOPENIA 06/16/2007    Cameron Sprang, PT, MPT [facility] [address] [place], [place], [address] Phone: [phone]   Fax:  [phone] 10/05/2015, 2:13 PM  Name: Kristine Francis MRN: [id number] Date of Birth: 06-20-1941

## 2015-10-08 ENCOUNTER — Telehealth: Payer: Self-pay

## 2015-10-08 NOTE — Telephone Encounter (Signed)
Fax from [facility] for refills on Omeprazole 40mg  1 qd #90. Pt is a former Dr Olevia Perches pt and needs to establish with a new physician before any further refills.

## 2015-10-09 ENCOUNTER — Encounter: Payer: Self-pay | Admitting: Rehabilitation

## 2015-10-09 ENCOUNTER — Ambulatory Visit: Payer: Medicare Other | Admitting: Rehabilitation

## 2015-10-09 DIAGNOSIS — R531 Weakness: Secondary | ICD-10-CM | POA: Diagnosis not present

## 2015-10-09 DIAGNOSIS — J3089 Other allergic rhinitis: Secondary | ICD-10-CM | POA: Diagnosis not present

## 2015-10-09 DIAGNOSIS — J3081 Allergic rhinitis due to animal (cat) (dog) hair and dander: Secondary | ICD-10-CM | POA: Diagnosis not present

## 2015-10-09 DIAGNOSIS — R2681 Unsteadiness on feet: Secondary | ICD-10-CM

## 2015-10-09 DIAGNOSIS — R269 Unspecified abnormalities of gait and mobility: Secondary | ICD-10-CM | POA: Diagnosis not present

## 2015-10-09 DIAGNOSIS — J301 Allergic rhinitis due to pollen: Secondary | ICD-10-CM | POA: Diagnosis not present

## 2015-10-09 NOTE — Therapy (Signed)
[facility] [address] [place], [place], [address] Phone: [phone]   Fax:  [phone]  Physical Therapy Treatment  Patient Details  Name: Kristine Francis MRN: [id number] Date of Birth: 1940/12/03 Referring Provider: Alonza Bogus, MD  Encounter Date: 10/09/2015      PT End of Session - 10/09/15 1409    Visit Number 9   Number of Visits 17   Date for PT Re-Evaluation 11/07/15   Authorization Type MCR, G codes every 10th visit ([employer] secondary, no PTA)   PT Start Time 1402   PT Stop Time 1445   PT Time Calculation (min) 43 min   Activity Tolerance Patient tolerated treatment well   Behavior During Therapy [facility] for tasks assessed/performed      Past Medical History  Diagnosis Date  . Ataxia   . Emphysema   . Diverticulosis   . Renal cyst   . Osteopenia   . GERD (gastroesophageal reflux disease)   . Iron deficiency anemia, unspecified   . Kidney cysts 11/16/12    small cyst on left  . Nystagmus, end-position   . Gait abnormality 05/14/2013    Ms.Casa a patient of Dr. Linda Hedges presented first interpreter thousand 13 with a gait dysfunction, she also had an episodic confusion and Loss device. Exam found a mild nystagmus and she was referred to ophthalmology on 03-2812, brain MRI was normal nystagmus was a secondary diagnosis to vestibulitis, ear nose and throat has followed and had seen the patient. At the time of the nystagmus the patie  . Hepatic steatosis   . Allergy   . Asthma     sees Dr. Tiajuana Amass     Past Surgical History  Procedure Laterality Date  . Tonsillectomy    . Breast biopsy Bilateral 1970's  . Cervical spine surgery  2010  . Hernia repair  Oct '14-left, remote-right    years ago right; left done '14  . Cataract extraction Bilateral LT:02/14/13,RT:02/21/13  . Skin cancer excision  05/13/13    FACE, basal cell  . Eye surgery Bilateral 01/2013&02/2013    left then right    There were no  vitals filed for this visit.  Visit Diagnosis:  Abnormality of gait  Generalized weakness  Unsteadiness      Subjective Assessment - 10/09/15 1407    Subjective "I've been rushed this week.  My husband may have some shoulder injuries so we have to go back on Saturday after Thanksgiving."   Pertinent History nystagmus for 7 years   Limitations Walking;House hold activities   Patient Stated Goals "I would like to walk without falling and walk straight."               NMR:  Continue to address vestibular compensation and adaptation with corner balance tasks as follows; standing with feet apart, solid ground, EO with gaze stabilization side/side and up/down x 15 reps; feet apart EC x 2 reps of 30 secs with cues to visually target prior to closing eyes for slight compensation; standing on pillow feet apart, EO with gaze stabilization up/down and side/side x 15 reps; standing on pillow, EO performing visual spotting at 90 deg angles.  Performed x 15 reps.  Provided education for safety at home as pt was performing older exercises and they are no longer appropriate.  See pt instruction for details.  Then performed quadruped activity alternating UEs only then LEs only both x 10 reps each progressing to alternating UE/LE. Note marked instability  with alternating arm and leg therefore educated to perform alternating LEs only at this time until can improve core stability.  Ended session with compensatory gait with visual spotting with head/eyes then turning body.  Tolerated at S level x 345'.                     PT Education - 10/09/15 1408    Education provided Yes   Education Details posture with gait, compensatory vestibular strategies, addition to HEP   Person(s) Educated Patient   Methods Explanation   Comprehension Verbalized understanding          PT Short Term Goals - 10/05/15 1414    PT SHORT TERM GOAL #1   Title Pt will initiate HEP for vestibular compensation  and balance to decrease fall risk.  (Target Date: 10/06/15)   Baseline met 10/05/15   Time 4   Period Weeks   Status Achieved   PT SHORT TERM GOAL #2   Title Pt will independently verbalize strategies to compensate for vestibular hypofunction in order to decrease fall risk.  (Target Date: 10/06/15)   Baseline met 10/05/15   Time 4   Period Weeks   Status Achieved   PT SHORT TERM GOAL #3   Title Pt will increase DGI to 17/24 in order to indicate functional improvement in balance and decreased fall risk. (Target Date: 10/06/15)   Baseline 15/24 on 10/05/15   Time 4   Period Weeks   Status Partially Met   PT SHORT TERM GOAL #4   Title Pt will negotiate in mildly busy environment without AD at S level without overt LOB to demonstrate safe introduction to community.  (Target Date: 10/06/15)   Status Achieved   PT SHORT TERM GOAL #5   Title Will provide pt with [place] in order to assess the self-perceived handicapping effects imposed by pts vestibular involvement.  (Target Date: 10/06/15)   Baseline DHI 14    Status Achieved           PT Long Term Goals - 09/08/15 1952    PT LONG TERM GOAL #1   Title Pt will be independent with HEP for vestibular deficits and balance to indicate decrease fall risk.  (Target date: 11/03/15)   Time 4   Period Weeks   PT LONG TERM GOAL #2   Title Pt will increase DGI to 20/24 to indicate decreased fall risk and improved functional balance.  (Target date: 11/03/15)   Time 4   Period Weeks   PT LONG TERM GOAL #3   Title Pt will demonstrate ability to ambulate in community setting (stores, etc) with LRAD at mod I level to indicate safe community negotiation.   (Target date: 11/03/15)   Time 4   Period Weeks   PT LONG TERM GOAL #4   Title Pt will improve DHI by 17 points in order to indicate improved self-perceived handicapping effects imposed by vestibular deficits.  (Target date: 11/03/15)   Time 4   Period Weeks               Plan -  10/09/15 1409    Clinical Impression Statement Skilled session focused on vestibular compensation, adaptation as well as balance and stability exercise in quadruped.  Educated on addition to HEP as pt was performing exercise from 2 years ago that is too difficult.    Pt will benefit from skilled therapeutic intervention in order to improve on the following deficits Abnormal gait;Decreased balance;Decreased coordination;Decreased  mobility;Difficulty walking;Dizziness;Impaired perceived functional ability;Impaired vision/preception;Postural dysfunction   Rehab Potential Good   PT Frequency 2x / week   PT Duration 8 weeks   PT Treatment/Interventions ADLs/Self Care Home Management;Canalith Repostioning;Gait training;Stair training;Functional mobility training;Therapeutic activities;Therapeutic exercise;Balance training;Neuromuscular re-education;Patient/family education;Vestibular;Visual/perceptual remediation/compensation   PT Next Visit Plan GCODE!!! Continue with visual spotting during turns and gait, unlevel surfaces with compensatory strategies (wide base, visual spotting, flashlights at night, etc), progress visual tracking, gaze as able   Consulted and Agree with Plan of Care Patient        Problem List Patient Active Problem List   Diagnosis Date Noted  . Hyperlipidemia 04/24/2015  . Dysuria 02/11/2015  . Acute cystitis without hematuria 02/11/2015  . Chest pain 02/06/2015  . Elevated BP 02/06/2015  . Left inguinal hernia 08/02/2013  . Gait abnormality 05/14/2013  . Gait disorder   . Nystagmus, end-position   . Routine health maintenance 12/13/2011  . LEG CRAMPS, NOCTURNAL 12/07/2010  . CALLUS, TOE 03/19/2010  . hip pain 03/19/2010  . ATAXIA 11/17/2008  . EMPHYSEMA 10/21/2008  . CERVICAL RADICULOPATHY, RIGHT 10/21/2008  . DIVERTICULOSIS OF COLON 08/14/2008  . RENAL CYST 08/14/2008  . ANEMIA-IRON DEFICIENCY 06/16/2007  . Asthma 06/16/2007  . GERD 06/16/2007  . OSTEOPENIA  06/16/2007    Cameron Sprang, PT, MPT [facility] [address] [place], [place], [address] Phone: [phone]   Fax:  [phone] 10/09/2015, 4:58 PM  Name: Kristine Francis MRN: [id number] Date of Birth: [birth year]

## 2015-10-09 NOTE — Patient Instructions (Signed)
Feet Apart, Arm Motion - Eyes Closed    With eyes closed and feet shoulder width apart, keep arms by your side.  Repeat __10__ times per session. Do _2___ sessions per day.  Copyright  VHI. All rights reserved.   When performing exercise on hands on knees, instead of lifting on arm and one leg, just alternate lifting legs focusing on keeping trunk tight and activated as well as not moving side to side.

## 2015-10-12 ENCOUNTER — Encounter: Payer: Self-pay | Admitting: Rehabilitation

## 2015-10-12 ENCOUNTER — Ambulatory Visit: Payer: Medicare Other | Admitting: Rehabilitation

## 2015-10-12 DIAGNOSIS — R2681 Unsteadiness on feet: Secondary | ICD-10-CM | POA: Diagnosis not present

## 2015-10-12 DIAGNOSIS — R269 Unspecified abnormalities of gait and mobility: Secondary | ICD-10-CM | POA: Diagnosis not present

## 2015-10-12 DIAGNOSIS — R531 Weakness: Secondary | ICD-10-CM | POA: Diagnosis not present

## 2015-10-12 NOTE — Therapy (Signed)
[facility] [address] [place], [place], [address] Phone: [phone]   Fax:  [phone]  Physical Therapy Treatment  Patient Details  Name: Kristine Francis MRN: [id number] Date of Birth: [birth year] Referring Provider: Alonza Bogus, MD  Encounter Date: 10/12/2015      PT End of Session - 10/12/15 1459    Visit Number 10   Number of Visits 17   Date for PT Re-Evaluation 11/07/15   Authorization Type MCR, G codes every 10th visit ([employer] secondary, no PTA)   PT Start Time 1448   PT Stop Time 1530   PT Time Calculation (min) 42 min   Activity Tolerance Patient tolerated treatment well   Behavior During Therapy [facility] for tasks assessed/performed      Past Medical History  Diagnosis Date  . Ataxia   . Emphysema   . Diverticulosis   . Renal cyst   . Osteopenia   . GERD (gastroesophageal reflux disease)   . Iron deficiency anemia, unspecified   . Kidney cysts 11/16/12    small cyst on left  . Nystagmus, end-position   . Gait abnormality 05/14/2013    Ms.Abdulla a patient of Dr. Linda Hedges presented first interpreter thousand 13 with a gait dysfunction, she also had an episodic confusion and Loss device. Exam found a mild nystagmus and she was referred to ophthalmology on 03-2812, brain MRI was normal nystagmus was a secondary diagnosis to vestibulitis, ear nose and throat has followed and had seen the patient. At the time of the nystagmus the patie  . Hepatic steatosis   . Allergy   . Asthma     sees Dr. Tiajuana Amass     Past Surgical History  Procedure Laterality Date  . Tonsillectomy    . Breast biopsy Bilateral 1970's  . Cervical spine surgery  2010  . Hernia repair  Oct '14-left, remote-right    years ago right; left done '14  . Cataract extraction Bilateral LT:02/14/13,RT:02/21/13  . Skin cancer excision  05/13/13    FACE, basal cell  . Eye surgery Bilateral 01/2013&02/2013    left then right    There were  no vitals filed for this visit.  Visit Diagnosis:  Abnormality of gait  Generalized weakness  Unsteadiness      Subjective Assessment - 10/12/15 1457    Subjective "I haven't had a lot of time to do my exercises this weekend because I've been busy running."    Pertinent History nystagmus for 7 years   Limitations Walking;House hold activities   Patient Stated Goals "I would like to walk without falling and walk straight."    Currently in Pain? No/denies               Therex:  Initiated session with pectoral and chest stretch while lying on towel roll x 2 reps of 60 secs with cues for hand placement.  Tolerated well.  Progressed to performing PWR! Standing moves in order to address large amplitude movements with focus on upright posture during mobility.  Performed all x 20 reps total (10 on each side for last three moves).  Educated pt on how to perform at counter top in order to increase safety with UE support as needed during activity to prevent overt LOB and/or fall.  Pt verbalized understanding and demonstrated understanding of exercises.                   PT Education - 10/12/15 1459    Education  provided Yes   Education Details PWR! exercises for posture   Person(s) Educated Patient   Methods Explanation   Comprehension Verbalized understanding          PT Short Term Goals - 10/05/15 1414    PT SHORT TERM GOAL #1   Title Pt will initiate HEP for vestibular compensation and balance to decrease fall risk.  (Target Date: 10/06/15)   Baseline met 10/05/15   Time 4   Period Weeks   Status Achieved   PT SHORT TERM GOAL #2   Title Pt will independently verbalize strategies to compensate for vestibular hypofunction in order to decrease fall risk.  (Target Date: 10/06/15)   Baseline met 10/05/15   Time 4   Period Weeks   Status Achieved   PT SHORT TERM GOAL #3   Title Pt will increase DGI to 17/24 in order to indicate functional improvement in balance  and decreased fall risk. (Target Date: 10/06/15)   Baseline 15/24 on 10/05/15   Time 4   Period Weeks   Status Partially Met   PT SHORT TERM GOAL #4   Title Pt will negotiate in mildly busy environment without AD at S level without overt LOB to demonstrate safe introduction to community.  (Target Date: 10/06/15)   Status Achieved   PT SHORT TERM GOAL #5   Title Will provide pt with [place] in order to assess the self-perceived handicapping effects imposed by pts vestibular involvement.  (Target Date: 10/06/15)   Baseline DHI 14    Status Achieved           PT Long Term Goals - 09/08/15 1952    PT LONG TERM GOAL #1   Title Pt will be independent with HEP for vestibular deficits and balance to indicate decrease fall risk.  (Target date: 11/03/15)   Time 4   Period Weeks   PT LONG TERM GOAL #2   Title Pt will increase DGI to 20/24 to indicate decreased fall risk and improved functional balance.  (Target date: 11/03/15)   Time 4   Period Weeks   PT LONG TERM GOAL #3   Title Pt will demonstrate ability to ambulate in community setting (stores, etc) with LRAD at mod I level to indicate safe community negotiation.   (Target date: 11/03/15)   Time 4   Period Weeks   PT LONG TERM GOAL #4   Title Pt will improve DHI by 17 points in order to indicate improved self-perceived handicapping effects imposed by vestibular deficits.  (Target date: 11/03/15)   Time 4   Period Weeks               Plan - 10/12/15 1459    Clinical Impression Statement Skilled session focused on posture with PWR! standing exercises.  Tolerated well, therefore provided for HEP.    Pt will benefit from skilled therapeutic intervention in order to improve on the following deficits Abnormal gait;Decreased balance;Decreased coordination;Decreased mobility;Difficulty walking;Dizziness;Impaired perceived functional ability;Impaired vision/preception;Postural dysfunction   Rehab Potential Good   PT Frequency 2x / week    PT Duration 8 weeks   PT Treatment/Interventions ADLs/Self Care Home Management;Canalith Repostioning;Gait training;Stair training;Functional mobility training;Therapeutic activities;Therapeutic exercise;Balance training;Neuromuscular re-education;Patient/family education;Vestibular;Visual/perceptual remediation/compensation   PT Next Visit Plan Continue with visual spotting during turns and gait, unlevel surfaces with compensatory strategies (wide base, visual spotting, flashlights at night, etc), progress visual tracking, gaze as able, posture during all mobility.    Consulted and Agree with Plan of Care Patient  G-Codes - 10/12/15 1733    Functional Assessment Tool Used DGI: 15/24   Functional Limitation Mobility: Walking and moving around   Mobility: Walking and Moving Around Current Status [phone]) At least 40 percent but less than 60 percent impaired, limited or restricted   Mobility: Walking and Moving Around Goal Status [phone]) At least 20 percent but less than 40 percent impaired, limited or restricted      Problem List Patient Active Problem List   Diagnosis Date Noted  . Hyperlipidemia 04/24/2015  . Dysuria 02/11/2015  . Acute cystitis without hematuria 02/11/2015  . Chest pain 02/06/2015  . Elevated BP 02/06/2015  . Left inguinal hernia 08/02/2013  . Gait abnormality 05/14/2013  . Gait disorder   . Nystagmus, end-position   . Routine health maintenance 12/13/2011  . LEG CRAMPS, NOCTURNAL 12/07/2010  . CALLUS, TOE 03/19/2010  . hip pain 03/19/2010  . ATAXIA 11/17/2008  . EMPHYSEMA 10/21/2008  . CERVICAL RADICULOPATHY, RIGHT 10/21/2008  . DIVERTICULOSIS OF COLON 08/14/2008  . RENAL CYST 08/14/2008  . ANEMIA-IRON DEFICIENCY 06/16/2007  . Asthma 06/16/2007  . GERD 06/16/2007  . OSTEOPENIA 06/16/2007    Cameron Sprang, PT, MPT [facility] [address] [place], [place], [address] Phone: [phone]   Fax:   [phone] 10/12/2015, 5:34 PM  Name: SHAHD OCCHIPINTI MRN: [id number] Date of Birth: 1941/06/07

## 2015-10-19 ENCOUNTER — Ambulatory Visit: Payer: Medicare Other | Admitting: Rehabilitation

## 2015-10-19 NOTE — Telephone Encounter (Signed)
Ok to refill 

## 2015-10-19 NOTE — Telephone Encounter (Signed)
Pt returned call. Follow up appointment has been made for Dec 25 2015 @ 11am. Please advise on refills. Thank you.

## 2015-10-20 DIAGNOSIS — J301 Allergic rhinitis due to pollen: Secondary | ICD-10-CM | POA: Diagnosis not present

## 2015-10-20 DIAGNOSIS — J3089 Other allergic rhinitis: Secondary | ICD-10-CM | POA: Diagnosis not present

## 2015-10-20 DIAGNOSIS — J3081 Allergic rhinitis due to animal (cat) (dog) hair and dander: Secondary | ICD-10-CM | POA: Diagnosis not present

## 2015-10-20 NOTE — Telephone Encounter (Signed)
Pt states she doesn't need the refill. She has been using apple cider vinegar for her symptoms. She will discuss further at her follow up.

## 2015-10-21 ENCOUNTER — Ambulatory Visit: Payer: Medicare Other | Admitting: Rehabilitation

## 2015-10-21 ENCOUNTER — Encounter: Payer: Self-pay | Admitting: Rehabilitation

## 2015-10-21 DIAGNOSIS — R2681 Unsteadiness on feet: Secondary | ICD-10-CM

## 2015-10-21 DIAGNOSIS — R269 Unspecified abnormalities of gait and mobility: Secondary | ICD-10-CM

## 2015-10-21 DIAGNOSIS — R531 Weakness: Secondary | ICD-10-CM | POA: Diagnosis not present

## 2015-10-21 NOTE — Therapy (Signed)
[facility] [address] [place], [place], [address] Phone: [phone]   Fax:  [phone]  Physical Therapy Treatment  Patient Details  Name: Kristine Francis MRN: [id number] Date of Birth: 10/18/1941 Referring Provider: Alonza Bogus, MD  Encounter Date: 10/21/2015      PT End of Session - 10/21/15 1105    Visit Number 11   Number of Visits 17   Date for PT Re-Evaluation 11/07/15   Authorization Type MCR, G codes every 10th visit ([employer] secondary, no PTA)   Activity Tolerance Patient tolerated treatment well   Behavior During Therapy [facility] for tasks assessed/performed      Past Medical History  Diagnosis Date  . Ataxia   . Emphysema   . Diverticulosis   . Renal cyst   . Osteopenia   . GERD (gastroesophageal reflux disease)   . Iron deficiency anemia, unspecified   . Kidney cysts 11/16/12    small cyst on left  . Nystagmus, end-position   . Gait abnormality 05/14/2013    Kristine Francis a patient of Dr. Linda Francis presented first interpreter thousand 13 with a gait dysfunction, she also had an episodic confusion and Loss device. Exam found a mild nystagmus and she was referred to ophthalmology on 03-2812, brain MRI was normal nystagmus was a secondary diagnosis to vestibulitis, ear nose and throat has followed and had seen the patient. At the time of the nystagmus the patie  . Hepatic steatosis   . Allergy   . Asthma     sees Dr. Tiajuana Francis     Past Surgical History  Procedure Laterality Date  . Tonsillectomy    . Breast biopsy Bilateral 1970's  . Cervical spine surgery  2010  . Hernia repair  Oct '14-left, remote-right    years ago right; left done '14  . Cataract extraction Bilateral LT:02/14/13,RT:02/21/13  . Skin cancer excision  05/13/13    FACE, basal cell  . Eye surgery Bilateral 01/2013&02/2013    left then right    There were no vitals filed for this visit.  Visit Diagnosis:  Abnormality of  gait  Generalized weakness  Unsteadiness      Subjective Assessment - 10/21/15 1103    Subjective "I haven't fallen this week, so that's good.  My husband will get his MRI results later this week."    Pertinent History nystagmus for 7 years   Limitations Walking;House hold activities   Patient Stated Goals "I would like to walk without falling and walk straight."    Currently in Pain? No/denies              NMR: Worked on vestibular adaptation while in // bars standing on small rocker board placed in ant/post and horizontal position while maintaining balance without UE support progressed to small weight shifts forwards/backwards and R and L.  Pt with marked improvement within session and improved ability to use ankle and hips to adjust for balance, initially compensating with overt trunk movements.  Ended in // bars with gaze stabilization task while on small rocker board side/side and vertically.  Cues for continued small ROM but with increased velocity of movement to increase challenge.  Tolerated well.  Progressed to static standing targeting horizontally without stopping in the middle, progressing to gait in // bars while targeting horizontally without stopping in middle.  Note slight imbalance but able to maintain at S level.  Progressed out of // bars while performing targeting side to side with stops in the  middle for increased safety.  Pt able to perform at S to min/guard level during session.    Gait:  Had discussion with pt regarding use/trial of single forearm crutch while on outdoor surfaces to increase stability and decrease fall risk.  Pt agreeable to try.  Performed 1000' outdoors on paved and grass surfaces with single forearm crutch at min/guard to close S level.  Max verbal cues for sequencing and slower gait speed to prevent LOB.  Feel that this would increase safety, however also would like to continue to practice in clinic before getting for community use.  Pt  verbalized understanding.                      PT Education - 10/21/15 1105    Education provided Yes   Person(s) Educated Patient   Methods Explanation   Comprehension Verbalized understanding          PT Short Term Goals - 10/05/15 1414    PT SHORT TERM GOAL #1   Title Pt will initiate HEP for vestibular compensation and balance to decrease fall risk.  (Target Date: 10/06/15)   Baseline met 10/05/15   Time 4   Period Weeks   Status Achieved   PT SHORT TERM GOAL #2   Title Pt will independently verbalize strategies to compensate for vestibular hypofunction in order to decrease fall risk.  (Target Date: 10/06/15)   Baseline met 10/05/15   Time 4   Period Weeks   Status Achieved   PT SHORT TERM GOAL #3   Title Pt will increase DGI to 17/24 in order to indicate functional improvement in balance and decreased fall risk. (Target Date: 10/06/15)   Baseline 15/24 on 10/05/15   Time 4   Period Weeks   Status Partially Met   PT SHORT TERM GOAL #4   Title Pt will negotiate in mildly busy environment without AD at S level without overt LOB to demonstrate safe introduction to community.  (Target Date: 10/06/15)   Status Achieved   PT SHORT TERM GOAL #5   Title Will provide pt with [place] in order to assess the self-perceived handicapping effects imposed by pts vestibular involvement.  (Target Date: 10/06/15)   Baseline DHI 14    Status Achieved           PT Long Term Goals - 09/08/15 1952    PT LONG TERM GOAL #1   Title Pt will be independent with HEP for vestibular deficits and balance to indicate decrease fall risk.  (Target date: 11/03/15)   Time 4   Period Weeks   PT LONG TERM GOAL #2   Title Pt will increase DGI to 20/24 to indicate decreased fall risk and improved functional balance.  (Target date: 11/03/15)   Time 4   Period Weeks   PT LONG TERM GOAL #3   Title Pt will demonstrate ability to ambulate in community setting (stores, etc) with LRAD at mod I  level to indicate safe community negotiation.   (Target date: 11/03/15)   Time 4   Period Weeks   PT LONG TERM GOAL #4   Title Pt will improve DHI by 17 points in order to indicate improved self-perceived handicapping effects imposed by vestibular deficits.  (Target date: 11/03/15)   Time 4   Period Weeks       Physical Therapy Progress Note  Dates of Reporting Period: 09/08/15 to 10/12/15  Objective Reports of Subjective Statement: Pt reports that she is  able to increase amount of head turns with decreased unsteadiness and has demonstrated improved stability with gait with vestibular compensations.  See subjective above for further details.    Objective Measurements: DGI 15/24  Goal Update: See LTG's above  Plan: Continue POC as above  Reason Skilled Services are Required: pt continues to require skilled OP PT in order to address balance, vestibular, postural and strength deficits.             Plan - 10/21/15 1105    Pt will benefit from skilled therapeutic intervention in order to improve on the following deficits Abnormal gait;Decreased balance;Decreased coordination;Decreased mobility;Difficulty walking;Dizziness;Impaired perceived functional ability;Impaired vision/preception;Postural dysfunction   Rehab Potential Good   PT Frequency 2x / week   PT Duration 8 weeks   PT Treatment/Interventions ADLs/Self Care Home Management;Canalith Repostioning;Gait training;Stair training;Functional mobility training;Therapeutic activities;Therapeutic exercise;Balance training;Neuromuscular re-education;Patient/family education;Vestibular;Visual/perceptual remediation/compensation   PT Next Visit Plan Continue with visual spotting during turns and gait, unlevel surfaces with compensatory strategies (wide base, visual spotting, flashlights at night, etc), progress visual tracking, gaze as able, posture during all mobility.    Consulted and Agree with Plan of Care Patient         Problem List Patient Active Problem List   Diagnosis Date Noted  . Hyperlipidemia 04/24/2015  . Dysuria 02/11/2015  . Acute cystitis without hematuria 02/11/2015  . Chest pain 02/06/2015  . Elevated BP 02/06/2015  . Left inguinal hernia 08/02/2013  . Gait abnormality 05/14/2013  . Gait disorder   . Nystagmus, end-position   . Routine health maintenance 12/13/2011  . LEG CRAMPS, NOCTURNAL 12/07/2010  . CALLUS, TOE 03/19/2010  . hip pain 03/19/2010  . ATAXIA 11/17/2008  . EMPHYSEMA 10/21/2008  . CERVICAL RADICULOPATHY, RIGHT 10/21/2008  . DIVERTICULOSIS OF COLON 08/14/2008  . RENAL CYST 08/14/2008  . ANEMIA-IRON DEFICIENCY 06/16/2007  . Asthma 06/16/2007  . GERD 06/16/2007  . OSTEOPENIA 06/16/2007   Cameron Sprang, PT, MPT [facility] [address] [place], [place], [address] Phone: [phone]   Fax:  [phone] 10/21/2015, 11:06 AM  Name: Kristine Francis MRN: [id number] Date of Birth: 11/13/1941

## 2015-10-23 ENCOUNTER — Ambulatory Visit: Payer: Medicare Other | Attending: Neurology | Admitting: Physical Therapy

## 2015-10-23 DIAGNOSIS — R2681 Unsteadiness on feet: Secondary | ICD-10-CM | POA: Insufficient documentation

## 2015-10-23 DIAGNOSIS — R269 Unspecified abnormalities of gait and mobility: Secondary | ICD-10-CM | POA: Diagnosis not present

## 2015-10-23 DIAGNOSIS — J3081 Allergic rhinitis due to animal (cat) (dog) hair and dander: Secondary | ICD-10-CM | POA: Diagnosis not present

## 2015-10-23 DIAGNOSIS — J301 Allergic rhinitis due to pollen: Secondary | ICD-10-CM | POA: Diagnosis not present

## 2015-10-23 DIAGNOSIS — J3089 Other allergic rhinitis: Secondary | ICD-10-CM | POA: Diagnosis not present

## 2015-10-23 NOTE — Patient Instructions (Addendum)
Remembered Targets    Look at target in front at __3-5__ feet away, close eyes and turn head slightly to the right imagining that you are still looking directly at the target. Open eyes and check to see if eyes are still on target. Repeat in opposite direction. Perform 10 reps in each direction.   Gaze Stabilization: Tip Card  1.Target must remain in focus, not blurry, and appear stationary while head is in motion. 2.Perform exercises with small head movements (45 to either side of midline). 3.Increase speed of head motion so long as target is in focus. 4.If you wear eyeglasses, be sure you can see target through lens (therapist will give specific instructions for bifocal / progressive lenses). 5.These exercises may provoke dizziness or nausea. Work through these symptoms. If too dizzy, slow head movement slightly. Rest between each exercise. 6.Exercises demand concentration; avoid distractions. 7.For safety, perform standing exercises close to a counter, wall, corner, or next to someone.   Gaze Stabilization: Standing Feet Apart    Feet shoulder width apart, keeping eyes on target on wall __approx 4__ feet away, tilt head down 15-30 and move head side to side for __45__ seconds.  Do _2-3___ sessions per day.

## 2015-10-24 NOTE — Therapy (Signed)
[facility] [address] [place], [place], [address] Phone: [phone]   Fax:  [phone]  Physical Therapy Treatment  Patient Details  Name: Kristine Francis MRN: [id number] Date of Birth: 01-09-1941 Referring Provider: Alonza Bogus, MD  Encounter Date: 10/23/2015      PT End of Session - 10/24/15 1913    Visit Number 12   Number of Visits 17   Date for PT Re-Evaluation 11/07/15   Authorization Type MCR, G codes every 10th visit ([employer] secondary, no PTA)   PT Start Time 1406   PT Stop Time 1448   PT Time Calculation (min) 42 min   Activity Tolerance Patient tolerated treatment well   Behavior During Therapy [facility] for tasks assessed/performed      Past Medical History  Diagnosis Date  . Ataxia   . Emphysema   . Diverticulosis   . Renal cyst   . Osteopenia   . GERD (gastroesophageal reflux disease)   . Iron deficiency anemia, unspecified   . Kidney cysts 11/16/12    small cyst on left  . Nystagmus, end-position   . Gait abnormality 05/14/2013    [name] a patient of Dr. Linda Hedges presented first interpreter thousand 13 with a gait dysfunction, she also had an episodic confusion and Loss device. Exam found a mild nystagmus and she was referred to ophthalmology on 03-2812, brain MRI was normal nystagmus was a secondary diagnosis to vestibulitis, ear nose and throat has followed and had seen the patient. At the time of the nystagmus the patie  . Hepatic steatosis   . Allergy   . Asthma     sees Dr. Tiajuana Amass     Past Surgical History  Procedure Laterality Date  . Tonsillectomy    . Breast biopsy Bilateral 1970's  . Cervical spine surgery  2010  . Hernia repair  Oct '14-left, remote-right    years ago right; left done '14  . Cataract extraction Bilateral LT:02/14/13,RT:02/21/13  . Skin cancer excision  05/13/13    FACE, basal cell  . Eye surgery Bilateral 01/2013&02/2013    left then right    There were no  vitals filed for this visit.  Visit Diagnosis:  Abnormality of gait  Unsteadiness      Subjective Assessment - 10/23/15 1409    Subjective Pt sustained one fall this past week when walking her dog. Pt reports "my left toe got hung on a root.  I fell on my hands but just got right back up." When asked about forearm crutch, pt states, "I thought about it when I had this fall, and I honestly don't think the forearm crutch would have prevented it. I think I'd rather just walk without the crutch for right now."   Pertinent History nystagmus for 7 years   Limitations Walking;House hold activities   Patient Stated Goals "I would like to walk without falling and walk straight."    Currently in Pain? No/denies                                 PT Education - 10/24/15 1901    Education provided Yes   Education Details HEP: added remembered targets. Progressed gaze stabilization exercises with emphasis on smaller head movements to keep target in focus.   Person(s) Educated Patient   Methods Explanation;Demonstration;Verbal cues;Handout   Comprehension Verbalized understanding;Returned demonstration  PT Short Term Goals - 10/05/15 1414    PT SHORT TERM GOAL #1   Title Pt will initiate HEP for vestibular compensation and balance to decrease fall risk.  (Target Date: 10/06/15)   Baseline met 10/05/15   Time 4   Period Weeks   Status Achieved   PT SHORT TERM GOAL #2   Title Pt will independently verbalize strategies to compensate for vestibular hypofunction in order to decrease fall risk.  (Target Date: 10/06/15)   Baseline met 10/05/15   Time 4   Period Weeks   Status Achieved   PT SHORT TERM GOAL #3   Title Pt will increase DGI to 17/24 in order to indicate functional improvement in balance and decreased fall risk. (Target Date: 10/06/15)   Baseline 15/24 on 10/05/15   Time 4   Period Weeks   Status Partially Met   PT SHORT TERM GOAL #4   Title Pt  will negotiate in mildly busy environment without AD at S level without overt LOB to demonstrate safe introduction to community.  (Target Date: 10/06/15)   Status Achieved   PT SHORT TERM GOAL #5   Title Will provide pt with [place] in order to assess the self-perceived handicapping effects imposed by pts vestibular involvement.  (Target Date: 10/06/15)   Baseline DHI 14    Status Achieved           PT Long Term Goals - 09/08/15 1952    PT LONG TERM GOAL #1   Title Pt will be independent with HEP for vestibular deficits and balance to indicate decrease fall risk.  (Target date: 11/03/15)   Time 4   Period Weeks   PT LONG TERM GOAL #2   Title Pt will increase DGI to 20/24 to indicate decreased fall risk and improved functional balance.  (Target date: 11/03/15)   Time 4   Period Weeks   PT LONG TERM GOAL #3   Title Pt will demonstrate ability to ambulate in community setting (stores, etc) with LRAD at mod I level to indicate safe community negotiation.   (Target date: 11/03/15)   Time 4   Period Weeks   PT LONG TERM GOAL #4   Title Pt will improve DHI by 17 points in order to indicate improved self-perceived handicapping effects imposed by vestibular deficits.  (Target date: 11/03/15)   Time 4   Period Weeks               Plan - 10/24/15 1914    Clinical Impression Statement Session focused on continued to focus on pt use of compensatory strategies for vestibular impairments during functional mobility to improve safety.  Added home exercise for substitution (remembered targets) and progressed gaze stabilization home exercise due to pt report of visibly clear target with X1 Viewing x30 seconds when performed with smaller amplitude horizontal head movements.   Pt will benefit from skilled therapeutic intervention in order to improve on the following deficits Abnormal gait;Decreased balance;Decreased coordination;Decreased mobility;Difficulty walking;Dizziness;Impaired perceived  functional ability;Impaired vision/preception;Postural dysfunction   Rehab Potential Good   PT Frequency 2x / week   PT Duration 8 weeks   PT Treatment/Interventions ADLs/Self Care Home Management;Canalith Repostioning;Gait training;Stair training;Functional mobility training;Therapeutic activities;Therapeutic exercise;Balance training;Neuromuscular re-education;Patient/family education;Vestibular;Visual/perceptual remediation/compensation   PT Next Visit Plan Continue with visual spotting during turns and gait, unlevel surfaces with compensatory strategies (wide base, visual spotting, flashlights at night, etc), progress visual tracking, gaze as able, posture during all mobility.    Consulted and Agree with  Plan of Care Patient        Problem List Patient Active Problem List   Diagnosis Date Noted  . Hyperlipidemia 04/24/2015  . Dysuria 02/11/2015  . Acute cystitis without hematuria 02/11/2015  . Chest pain 02/06/2015  . Elevated BP 02/06/2015  . Left inguinal hernia 08/02/2013  . Gait abnormality 05/14/2013  . Gait disorder   . Nystagmus, end-position   . Routine health maintenance 12/13/2011  . LEG CRAMPS, NOCTURNAL 12/07/2010  . CALLUS, TOE 03/19/2010  . hip pain 03/19/2010  . ATAXIA 11/17/2008  . EMPHYSEMA 10/21/2008  . CERVICAL RADICULOPATHY, RIGHT 10/21/2008  . DIVERTICULOSIS OF COLON 08/14/2008  . RENAL CYST 08/14/2008  . ANEMIA-IRON DEFICIENCY 06/16/2007  . Asthma 06/16/2007  . GERD 06/16/2007  . OSTEOPENIA 06/16/2007    Billie Ruddy, PT, DPT [facility] [address] [place], [place], [address] Phone: [phone]   Fax:  [phone] 10/24/2015, 7:23 PM  Name: Kristine Francis MRN: [id number] Date of Birth: 23-Sep-1941

## 2015-10-26 ENCOUNTER — Ambulatory Visit: Payer: Medicare Other | Admitting: Physical Therapy

## 2015-10-26 DIAGNOSIS — R269 Unspecified abnormalities of gait and mobility: Secondary | ICD-10-CM

## 2015-10-26 DIAGNOSIS — R2681 Unsteadiness on feet: Secondary | ICD-10-CM | POA: Diagnosis not present

## 2015-10-26 DIAGNOSIS — J3081 Allergic rhinitis due to animal (cat) (dog) hair and dander: Secondary | ICD-10-CM | POA: Diagnosis not present

## 2015-10-26 DIAGNOSIS — J3089 Other allergic rhinitis: Secondary | ICD-10-CM | POA: Diagnosis not present

## 2015-10-26 DIAGNOSIS — J301 Allergic rhinitis due to pollen: Secondary | ICD-10-CM | POA: Diagnosis not present

## 2015-10-26 NOTE — Therapy (Signed)
[facility] [address] [place], [place], [address] Phone: [phone]   Fax:  [phone]  Physical Therapy Treatment  Patient Details  Name: Kristine Francis MRN: [id number] Date of Birth: 01-21-1941 Referring Provider: Alonza Bogus, MD  Encounter Date: 10/26/2015      PT End of Session - 10/26/15 1930    Visit Number 13   Number of Visits 17   Date for PT Re-Evaluation 11/07/15   Authorization Type MCR, G codes every 10th visit ([employer] secondary, no PTA)   PT Start Time 1404   PT Stop Time 1446   PT Time Calculation (min) 42 min   Activity Tolerance Patient tolerated treatment well   Behavior During Therapy [facility] for tasks assessed/performed      Past Medical History  Diagnosis Date  . Ataxia   . Emphysema   . Diverticulosis   . Renal cyst   . Osteopenia   . GERD (gastroesophageal reflux disease)   . Iron deficiency anemia, unspecified   . Kidney cysts 11/16/12    small cyst on left  . Nystagmus, end-position   . Gait abnormality 05/14/2013    Kristine Francis a patient of Dr. Linda Hedges presented first interpreter thousand 13 with a gait dysfunction, she also had an episodic confusion and Loss device. Exam found a mild nystagmus and she was referred to ophthalmology on 03-2812, brain MRI was normal nystagmus was a secondary diagnosis to vestibulitis, ear nose and throat has followed and had seen the patient. At the time of the nystagmus the patie  . Hepatic steatosis   . Allergy   . Asthma     sees Dr. Tiajuana Amass     Past Surgical History  Procedure Laterality Date  . Tonsillectomy    . Breast biopsy Bilateral 1970's  . Cervical spine surgery  2010  . Hernia repair  Oct '14-left, remote-right    years ago right; left done '14  . Cataract extraction Bilateral LT:02/14/13,RT:02/21/13  . Skin cancer excision  05/13/13    FACE, basal cell  . Eye surgery Bilateral 01/2013&02/2013    left then right    There were no  vitals filed for this visit.  Visit Diagnosis:  Abnormality of gait  Unsteadiness      Subjective Assessment - 10/26/15 1409    Subjective Pt denies falls, reports no signficant changes. Pt reports practicing HEP "for several hours yesterday," per pt. Pt also reports being mindful of use of compensatory strategies when walking. Pt is beginning to notice improvement in her posture and states, "I saw my son at church on Sunday, and he commented that he thought I was walking straighter."   Pertinent History nystagmus for 7 years   Limitations Walking;House hold activities   Patient Stated Goals "I would like to walk without falling and walk straight."    Currently in Pain? No/denies                         [facility] Adult PT Treatment/Exercise - 10/26/15 0001    Ambulation/Gait   Ambulation/Gait Yes   Ambulation/Gait Assistance 5: Supervision   Ambulation Distance (Feet) 345 Feet   Assistive device None   Gait Pattern Step-through pattern;Trunk flexed   Ambulation Surface Level;Indoor   Gait Comments Pt with effective between-session carryover of cueing for use of compensatory strategies for turning with increased time required.   Neuro Re-ed    Neuro Re-ed Details  Pt gave effective return  demo of remembered targets exercise in seated without cueing from this PT; cued pt to increase amplitude of head turns as exercise becomes easier. Reviewed standing X1 viewing; see Vestibular Tx section and Pt Instructions for details.   Exercises   Exercises Other Exercises   Other Exercises  Reviewed PWR! basic 4 standing exercises to increase pt awareness of postural alignment and to increase amplitude of movement. Pt instruted to perform basic 4 moves standing in front of stable bed/couch due to intermittent posterior LOB throughout. Pt required 75% cueing initially for proper performance of PWR! exercises but did exhibit within-session carryover of cueing provided.                 PT Education - 10/26/15 1924    Education provided Yes   Education Details HEP: progressed gaze stabilization; reviewed remembered targets and PWR! basic 4 standing exercises.   Person(s) Educated Patient   Methods Explanation;Demonstration;Verbal cues;Tactile cues;Handout   Comprehension Verbalized understanding;Returned demonstration          PT Short Term Goals - 10/05/15 1414    PT SHORT TERM GOAL #1   Title Pt will initiate HEP for vestibular compensation and balance to decrease fall risk.  (Target Date: 10/06/15)   Baseline met 10/05/15   Time 4   Period Weeks   Status Achieved   PT SHORT TERM GOAL #2   Title Pt will independently verbalize strategies to compensate for vestibular hypofunction in order to decrease fall risk.  (Target Date: 10/06/15)   Baseline met 10/05/15   Time 4   Period Weeks   Status Achieved   PT SHORT TERM GOAL #3   Title Pt will increase DGI to 17/24 in order to indicate functional improvement in balance and decreased fall risk. (Target Date: 10/06/15)   Baseline 15/24 on 10/05/15   Time 4   Period Weeks   Status Partially Met   PT SHORT TERM GOAL #4   Title Pt will negotiate in mildly busy environment without AD at S level without overt LOB to demonstrate safe introduction to community.  (Target Date: 10/06/15)   Status Achieved   PT SHORT TERM GOAL #5   Title Will provide pt with [place] in order to assess the self-perceived handicapping effects imposed by pts vestibular involvement.  (Target Date: 10/06/15)   Baseline DHI 14    Status Achieved           PT Long Term Goals - 09/08/15 1952    PT LONG TERM GOAL #1   Title Pt will be independent with HEP for vestibular deficits and balance to indicate decrease fall risk.  (Target date: 11/03/15)   Time 4   Period Weeks   PT LONG TERM GOAL #2   Title Pt will increase DGI to 20/24 to indicate decreased fall risk and improved functional balance.  (Target date: 11/03/15)   Time 4   Period  Weeks   PT LONG TERM GOAL #3   Title Pt will demonstrate ability to ambulate in community setting (stores, etc) with LRAD at mod I level to indicate safe community negotiation.   (Target date: 11/03/15)   Time 4   Period Weeks   PT LONG TERM GOAL #4   Title Pt will improve DHI by 17 points in order to indicate improved self-perceived handicapping effects imposed by vestibular deficits.  (Target date: 11/03/15)   Time 4   Period Weeks  Plan - 10/26/15 1931    Clinical Impression Statement Session focused on 1) increasing postural awareness and 2) promoting use of compensation and substitution for vestibular impairments to increase safety with functional mobility. Pt demonstrates excellent motivation, compliance with HEP and is noting perceived improvement since beginning this episdoe of PT.   Pt will benefit from skilled therapeutic intervention in order to improve on the following deficits Abnormal gait;Decreased balance;Decreased coordination;Decreased mobility;Difficulty walking;Dizziness;Impaired perceived functional ability;Impaired vision/preception;Postural dysfunction   Rehab Potential Good   PT Frequency 2x / week   PT Duration 8 weeks   PT Treatment/Interventions ADLs/Self Care Home Management;Canalith Repostioning;Gait training;Stair training;Functional mobility training;Therapeutic activities;Therapeutic exercise;Balance training;Neuromuscular re-education;Patient/family education;Vestibular;Visual/perceptual remediation/compensation   PT Next Visit Plan Continue with visual spotting during turns and gait, unlevel surfaces with compensatory strategies (wide base, visual spotting, flashlights at night, etc), progress visual tracking, gaze as able, posture during all mobility.    Consulted and Agree with Plan of Care Patient        Problem List Patient Active Problem List   Diagnosis Date Noted  . Hyperlipidemia 04/24/2015  . Dysuria 02/11/2015  . Acute  cystitis without hematuria 02/11/2015  . Chest pain 02/06/2015  . Elevated BP 02/06/2015  . Left inguinal hernia 08/02/2013  . Gait abnormality 05/14/2013  . Gait disorder   . Nystagmus, end-position   . Routine health maintenance 12/13/2011  . LEG CRAMPS, NOCTURNAL 12/07/2010  . CALLUS, TOE 03/19/2010  . hip pain 03/19/2010  . ATAXIA 11/17/2008  . EMPHYSEMA 10/21/2008  . CERVICAL RADICULOPATHY, RIGHT 10/21/2008  . DIVERTICULOSIS OF COLON 08/14/2008  . RENAL CYST 08/14/2008  . ANEMIA-IRON DEFICIENCY 06/16/2007  . Asthma 06/16/2007  . GERD 06/16/2007  . OSTEOPENIA 06/16/2007    Billie Ruddy, PT, DPT [facility] [address] [place], [place], [address] Phone: [phone]   Fax:  [phone] 10/26/2015, 7:37 PM  Name: Kristine Francis MRN: [id number] Date of Birth: 1941/07/29

## 2015-10-26 NOTE — Patient Instructions (Addendum)
Remembered Targets    Look at target in front at __3-5__ feet away, close eyes and turn head slightly to the right imagining that you are still looking directly at the target. Open eyes and check to see if eyes are still on target. Repeat in opposite direction. Perform 10 reps in each direction.   Gaze Stabilization: Tip Card  1.Target must remain in focus, not blurry, and appear stationary while head is in motion. 2.Perform exercises with small head movements (45 to either side of midline). 3.Increase speed of head motion so long as target is in focus. 4.If you wear eyeglasses, be sure you can see target through lens (therapist will give specific instructions for bifocal / progressive lenses). 5.These exercises may provoke dizziness or nausea. Work through these symptoms. If too dizzy, slow head movement slightly. Rest between each exercise. 6.Exercises demand concentration; avoid distractions. 7.For safety, perform standing exercises close to a counter, wall, corner, or next to someone.   Gaze Stabilization: Standing Feet Apart    Feet shoulder width apart, keeping eyes on target on wall __approx 5-6__ feet away, tilt head down 15-30 and move head side to side for __60__ seconds. Then move head up and down for __60__ seconds. Do _2-3___ sessions per day.

## 2015-10-30 ENCOUNTER — Ambulatory Visit: Payer: Medicare Other | Admitting: Physical Therapy

## 2015-10-30 DIAGNOSIS — R269 Unspecified abnormalities of gait and mobility: Secondary | ICD-10-CM | POA: Diagnosis not present

## 2015-10-30 DIAGNOSIS — R2681 Unsteadiness on feet: Secondary | ICD-10-CM

## 2015-10-31 NOTE — Therapy (Signed)
[facility] [address] [place], [place], [address] Phone: [phone]   Fax:  [phone]  Physical Therapy Treatment  Patient Details  Name: Kristine Francis MRN: [id number] Date of Birth: Oct 08, 1941 Referring Provider: Alonza Bogus, MD  Encounter Date: 10/30/2015      Kristine Francis End of Session - 10/31/15 1213    Visit Number 14   Number of Visits 17   Date for Kristine Francis Re-Evaluation 11/07/15   Authorization Type MCR, G codes every 10th visit ([employer] secondary, no PTA)   Kristine Francis Start Time 1409   Kristine Francis Stop Time 1448   Kristine Francis Time Calculation (min) 39 min   Equipment Utilized During Treatment Gait belt   Activity Tolerance Patient tolerated treatment well   Behavior During Therapy WFL for tasks assessed/performed      Past Medical History  Diagnosis Date  . Ataxia   . Emphysema   . Diverticulosis   . Renal cyst   . Osteopenia   . GERD (gastroesophageal reflux disease)   . Iron deficiency anemia, unspecified   . Kidney cysts 11/16/12    small cyst on left  . Nystagmus, end-position   . Gait abnormality 05/14/2013    Ms.Hum a patient of Dr. Linda Hedges presented first interpreter thousand 13 with a gait dysfunction, she also had an episodic confusion and Loss device. Exam found a mild nystagmus and she was referred to ophthalmology on 03-2812, brain MRI was normal nystagmus was a secondary diagnosis to vestibulitis, ear nose and throat has followed and had seen the patient. At the time of the nystagmus the patie  . Hepatic steatosis   . Allergy   . Asthma     sees Dr. Tiajuana Amass     Past Surgical History  Procedure Laterality Date  . Tonsillectomy    . Breast biopsy Bilateral 1970's  . Cervical spine surgery  2010  . Hernia repair  Oct '14-left, remote-right    years ago right; left done '14  . Cataract extraction Bilateral LT:02/14/13,RT:02/21/13  . Skin cancer excision  05/13/13    FACE, basal cell  . Eye surgery Bilateral  01/2013&02/2013    left then right    There were no vitals filed for this visit.  Visit Diagnosis:  Abnormality of gait  Unsteadiness      Subjective Assessment - 10/30/15 1412    Subjective Kristine Francis reports no falls, no significant changes. Kristine Francis has been performing home exercises daily. Kristine Francis understands that she could schedule 2 additional Kristine Francis appts next week, but wishes to discharge after this session due to husband's upcoming shoulder surgery.   Pertinent History nystagmus for 7 years   Limitations Walking;House hold activities   Patient Stated Goals "I would like to walk without falling and walk straight."    Currently in Pain? No/denies                                 Kristine Francis Education - 10/30/15 1452    Education provided Yes   Education Details goals, findings, progress, and DC plan. Ongoing risk for falls, as indicating by DGI score   Person(s) Educated Patient   Methods Explanation   Comprehension Verbalized understanding          Kristine Francis Short Term Goals - 10/30/15 1430    Kristine Francis SHORT TERM GOAL #1   Title Kristine Francis will initiate HEP for vestibular compensation and balance to decrease fall risk.  (  Target Date: 10/06/15)   Baseline met 10/05/15   Time 4   Period Weeks   Status Achieved   Kristine Francis SHORT TERM GOAL #2   Title Kristine Francis will independently verbalize strategies to compensate for vestibular hypofunction in order to decrease fall risk.  (Target Date: 10/06/15)   Baseline met 10/05/15   Time 4   Period Weeks   Status Achieved   Kristine Francis SHORT TERM GOAL #3   Title Kristine Francis will increase DGI to 17/24 in order to indicate functional improvement in balance and decreased fall risk. (Target Date: 10/06/15)   Baseline Met 08-Nov-2023 with DGI score of 19/20   Time 4   Period Weeks   Status Achieved   Kristine Francis SHORT TERM GOAL #4   Title Kristine Francis will negotiate in mildly busy environment without AD at S level without overt LOB to demonstrate safe introduction to community.  (Target Date: 10/06/15)   Status  Achieved   Kristine Francis SHORT TERM GOAL #5   Title Will provide Kristine Francis with [place] in order to assess the self-perceived handicapping effects imposed by pts vestibular involvement.  (Target Date: 10/06/15)   Baseline DHI 14    Status Achieved           Kristine Francis Long Term Goals - 11/08/2015 1431    Kristine Francis LONG TERM GOAL #1   Title Kristine Francis will be independent with HEP for vestibular deficits and balance to indicate decrease fall risk.  (Target date: 11/03/15)   Time 4   Period Weeks   Status Achieved   Kristine Francis LONG TERM GOAL #2   Title Kristine Francis will increase DGI to 20/24 to indicate decreased fall risk and improved functional balance.  (Target date: 11/03/15)   Baseline 2023-11-08: DGI score = 19/24   Time 4   Period Weeks   Status Partially Met   Kristine Francis LONG TERM GOAL #3   Title Kristine Francis will demonstrate ability to ambulate in community setting (stores, etc) with LRAD at mod I level to indicate safe community negotiation.   (Target date: 11/03/15)   Baseline 11-08-2023: Kristine Francis independent (no AD) with community ambulation with exception of requiring min A to prevent colliding with car with head turn to R side.   Time 4   Period Weeks   Status Partially Met   Kristine Francis LONG TERM GOAL #4   Title Kristine Francis will improve DHI by 17 points in order to indicate improved self-perceived handicapping effects imposed by vestibular deficits.  (Target date: 11/03/15)   Baseline Initial DHI score = 14%. On Nov 08, 2023 DHI score = 10%   Time 4   Period Weeks   Status Not Met               Plan - 10/31/15 1215    Clinical Impression Statement Kristine Francis has met 5 of 5 STG's and 1 of 4 LTG's; 2 of 4 LTG's partially met. LTG set for [facility] not met; however, this may be attributed to the fact that Kristine Francis does not perceive her functional limitations to be secondary to dizziness but rather to imbalance. Kristine Francis is satisfied with her current functional status and therefore will be discharged at this time. Kristine Francis educated on goals, findings, progress, and DC plan. Kristine Francis verbalized understanding and was in full  agreement with DC plan.   Consulted and Agree with Plan of Care Patient          G-Codes - 08-Nov-2015 1453    Functional Assessment Tool Used DGI: 19/24   Functional Limitation Mobility: Walking and moving  around   Mobility: Walking and Moving Around Goal Status [phone]) At least 20 percent but less than 40 percent impaired, limited or restricted   Mobility: Walking and Moving Around Discharge Status [phone]) At least 20 percent but less than 40 percent impaired, limited or restricted     PHYSICAL THERAPY DISCHARGE SUMMARY  Visits from Start of Care: 14  Current functional level related to goals / functional outcomes: See goals and goal statuses above.    Remaining deficits: Decreased stability with high level balance and dynamic gait tasks. When ambulating in busy, unlevel, community environments, Kristine Francis is independent for linear gait but does require supervision to min A when performing horizontal head turns while ambulating. Kristine Francis has been educated on compensatory strategies to increase gait stability while turning and exhibits consistent return demonstration of said strategies.    Education / Equipment: Kristine Francis educated on HEP, compensatory strategies for vestibular impairments, and available assistive devices. Kristine Francis has elected to continue to ambulate without an assistive device at this time. Plan: Patient agrees to discharge.  Patient goals were partially met. Patient is being discharged due to being pleased with the current functional level.  ?????       Problem List Patient Active Problem List   Diagnosis Date Noted  . Hyperlipidemia 04/24/2015  . Dysuria 02/11/2015  . Acute cystitis without hematuria 02/11/2015  . Chest pain 02/06/2015  . Elevated BP 02/06/2015  . Left inguinal hernia 08/02/2013  . Gait abnormality 05/14/2013  . Gait disorder   . Nystagmus, end-position   . Routine health maintenance 12/13/2011  . LEG CRAMPS, NOCTURNAL 12/07/2010  . CALLUS, TOE 03/19/2010  . hip  pain 03/19/2010  . ATAXIA 11/17/2008  . EMPHYSEMA 10/21/2008  . CERVICAL RADICULOPATHY, RIGHT 10/21/2008  . DIVERTICULOSIS OF COLON 08/14/2008  . RENAL CYST 08/14/2008  . ANEMIA-IRON DEFICIENCY 06/16/2007  . Asthma 06/16/2007  . GERD 06/16/2007  . OSTEOPENIA 06/16/2007   Kristine Francis, Kristine Francis, Kristine Francis [facility] [address] [place], [place], [address] Phone: [phone]   Fax:  [phone] 10/31/2015, 12:18 PM   Name: Kristine Francis MRN: [id number] Date of Birth: [birth year]

## 2015-11-02 DIAGNOSIS — J301 Allergic rhinitis due to pollen: Secondary | ICD-10-CM | POA: Diagnosis not present

## 2015-11-02 DIAGNOSIS — J3081 Allergic rhinitis due to animal (cat) (dog) hair and dander: Secondary | ICD-10-CM | POA: Diagnosis not present

## 2015-11-02 DIAGNOSIS — J3089 Other allergic rhinitis: Secondary | ICD-10-CM | POA: Diagnosis not present

## 2015-11-18 DIAGNOSIS — J3089 Other allergic rhinitis: Secondary | ICD-10-CM | POA: Diagnosis not present

## 2015-11-18 DIAGNOSIS — J301 Allergic rhinitis due to pollen: Secondary | ICD-10-CM | POA: Diagnosis not present

## 2015-11-18 DIAGNOSIS — J3081 Allergic rhinitis due to animal (cat) (dog) hair and dander: Secondary | ICD-10-CM | POA: Diagnosis not present

## 2015-12-04 DIAGNOSIS — J3081 Allergic rhinitis due to animal (cat) (dog) hair and dander: Secondary | ICD-10-CM | POA: Diagnosis not present

## 2015-12-04 DIAGNOSIS — J3089 Other allergic rhinitis: Secondary | ICD-10-CM | POA: Diagnosis not present

## 2015-12-04 DIAGNOSIS — J301 Allergic rhinitis due to pollen: Secondary | ICD-10-CM | POA: Diagnosis not present

## 2015-12-07 DIAGNOSIS — J301 Allergic rhinitis due to pollen: Secondary | ICD-10-CM | POA: Diagnosis not present

## 2015-12-07 DIAGNOSIS — J3089 Other allergic rhinitis: Secondary | ICD-10-CM | POA: Diagnosis not present

## 2015-12-11 ENCOUNTER — Encounter: Payer: Self-pay | Admitting: Family Medicine

## 2015-12-11 ENCOUNTER — Ambulatory Visit (INDEPENDENT_AMBULATORY_CARE_PROVIDER_SITE_OTHER): Payer: Medicare Other | Admitting: Family Medicine

## 2015-12-11 VITALS — BP 157/97 | HR 72 | Temp 97.9°F | Ht 59.0 in | Wt 89.0 lb

## 2015-12-11 DIAGNOSIS — R309 Painful micturition, unspecified: Secondary | ICD-10-CM | POA: Diagnosis not present

## 2015-12-11 DIAGNOSIS — N390 Urinary tract infection, site not specified: Secondary | ICD-10-CM

## 2015-12-11 DIAGNOSIS — R300 Dysuria: Secondary | ICD-10-CM | POA: Diagnosis not present

## 2015-12-11 LAB — POCT URINALYSIS DIPSTICK
Bilirubin, UA: NEGATIVE
Blood, UA: NEGATIVE
Glucose, UA: NEGATIVE
Ketones, UA: NEGATIVE
Nitrite, UA: NEGATIVE
Protein, UA: NEGATIVE
Spec Grav, UA: 1.015
Urobilinogen, UA: 0.2
pH, UA: 7.5

## 2015-12-11 MED ORDER — CIPROFLOXACIN HCL 500 MG PO TABS: 500.0000 mg | ORAL_TABLET | Freq: Two times a day (BID) | ORAL | Status: DC

## 2015-12-11 NOTE — Progress Notes (Signed)
Pre visit review using our clinic review tool, if applicable. No additional management support is needed unless otherwise documented below in the visit note. 

## 2015-12-11 NOTE — Progress Notes (Signed)
   Subjective:    Patient ID: Kristine Francis, female    DOB: 06-Jan-1941, 75 y.o.   MRN: [id number]  HPI Here for 2 days of urgency to urinate and burning. No nausea or fever. Drinking water and taking AZO.    Review of Systems  Constitutional: Negative.   Respiratory: Negative.   Cardiovascular: Negative.   Gastrointestinal: Negative.   Genitourinary: Positive for dysuria, urgency and frequency. Negative for hematuria and flank pain.       Objective:   Physical Exam  Constitutional: She appears well-developed and well-nourished.  Cardiovascular: Normal rate, regular rhythm, normal heart sounds and intact distal pulses.   Pulmonary/Chest: Effort normal and breath sounds normal.  Abdominal: Soft. Bowel sounds are normal.          Assessment & Plan:  UTI, treat with Cipro. Culture the sample

## 2015-12-14 LAB — URINE CULTURE: Colony Count: 25000

## 2015-12-15 DIAGNOSIS — J301 Allergic rhinitis due to pollen: Secondary | ICD-10-CM | POA: Diagnosis not present

## 2015-12-15 DIAGNOSIS — J3089 Other allergic rhinitis: Secondary | ICD-10-CM | POA: Diagnosis not present

## 2015-12-15 DIAGNOSIS — J3081 Allergic rhinitis due to animal (cat) (dog) hair and dander: Secondary | ICD-10-CM | POA: Diagnosis not present

## 2015-12-18 DIAGNOSIS — J3089 Other allergic rhinitis: Secondary | ICD-10-CM | POA: Diagnosis not present

## 2015-12-18 DIAGNOSIS — J301 Allergic rhinitis due to pollen: Secondary | ICD-10-CM | POA: Diagnosis not present

## 2015-12-18 DIAGNOSIS — J3081 Allergic rhinitis due to animal (cat) (dog) hair and dander: Secondary | ICD-10-CM | POA: Diagnosis not present

## 2015-12-21 DIAGNOSIS — J3081 Allergic rhinitis due to animal (cat) (dog) hair and dander: Secondary | ICD-10-CM | POA: Diagnosis not present

## 2015-12-21 DIAGNOSIS — J3089 Other allergic rhinitis: Secondary | ICD-10-CM | POA: Diagnosis not present

## 2015-12-21 DIAGNOSIS — J301 Allergic rhinitis due to pollen: Secondary | ICD-10-CM | POA: Diagnosis not present

## 2015-12-25 DIAGNOSIS — J3089 Other allergic rhinitis: Secondary | ICD-10-CM | POA: Diagnosis not present

## 2015-12-25 DIAGNOSIS — J301 Allergic rhinitis due to pollen: Secondary | ICD-10-CM | POA: Diagnosis not present

## 2015-12-25 DIAGNOSIS — J3081 Allergic rhinitis due to animal (cat) (dog) hair and dander: Secondary | ICD-10-CM | POA: Diagnosis not present

## 2015-12-29 DIAGNOSIS — J301 Allergic rhinitis due to pollen: Secondary | ICD-10-CM | POA: Diagnosis not present

## 2015-12-29 DIAGNOSIS — J3081 Allergic rhinitis due to animal (cat) (dog) hair and dander: Secondary | ICD-10-CM | POA: Diagnosis not present

## 2015-12-29 DIAGNOSIS — J3089 Other allergic rhinitis: Secondary | ICD-10-CM | POA: Diagnosis not present

## 2016-01-05 DIAGNOSIS — J301 Allergic rhinitis due to pollen: Secondary | ICD-10-CM | POA: Diagnosis not present

## 2016-01-05 DIAGNOSIS — J3081 Allergic rhinitis due to animal (cat) (dog) hair and dander: Secondary | ICD-10-CM | POA: Diagnosis not present

## 2016-01-05 DIAGNOSIS — J3089 Other allergic rhinitis: Secondary | ICD-10-CM | POA: Diagnosis not present

## 2016-01-11 DIAGNOSIS — J3081 Allergic rhinitis due to animal (cat) (dog) hair and dander: Secondary | ICD-10-CM | POA: Diagnosis not present

## 2016-01-11 DIAGNOSIS — J3089 Other allergic rhinitis: Secondary | ICD-10-CM | POA: Diagnosis not present

## 2016-01-11 DIAGNOSIS — J301 Allergic rhinitis due to pollen: Secondary | ICD-10-CM | POA: Diagnosis not present

## 2016-01-22 DIAGNOSIS — J301 Allergic rhinitis due to pollen: Secondary | ICD-10-CM | POA: Diagnosis not present

## 2016-01-22 DIAGNOSIS — J3081 Allergic rhinitis due to animal (cat) (dog) hair and dander: Secondary | ICD-10-CM | POA: Diagnosis not present

## 2016-01-22 DIAGNOSIS — J3089 Other allergic rhinitis: Secondary | ICD-10-CM | POA: Diagnosis not present

## 2016-01-29 DIAGNOSIS — J3081 Allergic rhinitis due to animal (cat) (dog) hair and dander: Secondary | ICD-10-CM | POA: Diagnosis not present

## 2016-01-29 DIAGNOSIS — J3089 Other allergic rhinitis: Secondary | ICD-10-CM | POA: Diagnosis not present

## 2016-01-29 DIAGNOSIS — J301 Allergic rhinitis due to pollen: Secondary | ICD-10-CM | POA: Diagnosis not present

## 2016-02-03 DIAGNOSIS — J301 Allergic rhinitis due to pollen: Secondary | ICD-10-CM | POA: Diagnosis not present

## 2016-02-03 DIAGNOSIS — J3081 Allergic rhinitis due to animal (cat) (dog) hair and dander: Secondary | ICD-10-CM | POA: Diagnosis not present

## 2016-02-03 DIAGNOSIS — J3089 Other allergic rhinitis: Secondary | ICD-10-CM | POA: Diagnosis not present

## 2016-02-08 DIAGNOSIS — J3081 Allergic rhinitis due to animal (cat) (dog) hair and dander: Secondary | ICD-10-CM | POA: Diagnosis not present

## 2016-02-08 DIAGNOSIS — L570 Actinic keratosis: Secondary | ICD-10-CM | POA: Diagnosis not present

## 2016-02-08 DIAGNOSIS — J301 Allergic rhinitis due to pollen: Secondary | ICD-10-CM | POA: Diagnosis not present

## 2016-02-08 DIAGNOSIS — J3089 Other allergic rhinitis: Secondary | ICD-10-CM | POA: Diagnosis not present

## 2016-02-15 ENCOUNTER — Encounter: Payer: Self-pay | Admitting: Family Medicine

## 2016-02-15 ENCOUNTER — Ambulatory Visit (INDEPENDENT_AMBULATORY_CARE_PROVIDER_SITE_OTHER): Payer: Medicare Other | Admitting: Family Medicine

## 2016-02-15 VITALS — BP 154/93 | HR 78 | Temp 99.0°F | Ht 59.0 in | Wt 94.0 lb

## 2016-02-15 DIAGNOSIS — J019 Acute sinusitis, unspecified: Secondary | ICD-10-CM | POA: Diagnosis not present

## 2016-02-15 MED ORDER — AZITHROMYCIN 250 MG PO TABS: ORAL_TABLET | ORAL | Status: DC

## 2016-02-15 NOTE — Progress Notes (Signed)
Pre visit review using our clinic review tool, if applicable. No additional management support is needed unless otherwise documented below in the visit note. 

## 2016-02-15 NOTE — Progress Notes (Signed)
   Subjective:    Patient ID: Kristine Francis, female    DOB: Mar 26, 1941, 75 y.o.   MRN: [id number]  HPI Here for 3 days of stuffy head, PND, ST, and coughing up yellow sputum.    Review of Systems  Constitutional: Positive for fever.  HENT: Positive for congestion, postnasal drip, sinus pressure and sore throat. Negative for ear pain.   Eyes: Negative.   Respiratory: Positive for cough.        Objective:   Physical Exam  Constitutional: She appears well-developed and well-nourished.  HENT:  Right Ear: External ear normal.  Left Ear: External ear normal.  Nose: Nose normal.  Mouth/Throat: Oropharynx is clear and moist.  Eyes: Conjunctivae are normal.  Neck: No thyromegaly present.  Pulmonary/Chest: Effort normal and breath sounds normal.  Lymphadenopathy:    She has no cervical adenopathy.          Assessment & Plan:  Sinusitis, treat with a Zpack

## 2016-02-25 ENCOUNTER — Ambulatory Visit: Payer: Medicare Other | Admitting: Gastroenterology

## 2016-02-26 ENCOUNTER — Encounter: Payer: Self-pay | Admitting: Gastroenterology

## 2016-02-26 ENCOUNTER — Ambulatory Visit (INDEPENDENT_AMBULATORY_CARE_PROVIDER_SITE_OTHER): Payer: Medicare Other | Admitting: Gastroenterology

## 2016-02-26 VITALS — BP 158/90 | HR 80 | Ht 60.0 in | Wt 92.1 lb

## 2016-02-26 DIAGNOSIS — K219 Gastro-esophageal reflux disease without esophagitis: Secondary | ICD-10-CM

## 2016-02-26 DIAGNOSIS — J3081 Allergic rhinitis due to animal (cat) (dog) hair and dander: Secondary | ICD-10-CM | POA: Diagnosis not present

## 2016-02-26 DIAGNOSIS — R159 Full incontinence of feces: Secondary | ICD-10-CM | POA: Diagnosis not present

## 2016-02-26 DIAGNOSIS — J301 Allergic rhinitis due to pollen: Secondary | ICD-10-CM | POA: Diagnosis not present

## 2016-02-26 DIAGNOSIS — R32 Unspecified urinary incontinence: Secondary | ICD-10-CM

## 2016-02-26 DIAGNOSIS — J3089 Other allergic rhinitis: Secondary | ICD-10-CM | POA: Diagnosis not present

## 2016-02-26 MED ORDER — OMEPRAZOLE 40 MG PO CPDR: 40.0000 mg | DELAYED_RELEASE_CAPSULE | Freq: Every day | ORAL | Status: DC

## 2016-02-26 NOTE — Patient Instructions (Signed)
We have sent Omeprazole to your pharmacy Earlie Counts PT from Physical Therapy will be contacting you with an appointment  Follow up in 1 year

## 2016-02-26 NOTE — Progress Notes (Signed)
Kristine Francis    [id number]    1941/06/20  Primary Care Physician:FRY,STEPHEN A, MD  Referring Physician: [name], MD [place], [place] [id number]  Chief complaint:  Chronic cough, GERD HPI:  75 year old female with history of chronic GERD was on omeprazole in the past but she stopped taking it about a year ago and started drinking apple cider vinegar she currently has no heartburn or regurgitation but continues to have hoarseness of voice and chronic cough. She thinks it is related to her allergies. She takes allergy shots monthly and sees allergists about once a year but continues to have chronic cough. Denies dysphagia or odynophagia. Last colonoscopy in 2015 was normal. She has both fecal and urinary incontinence and wears poise pads, thinks it's getting worse. She usually has seepage of a small amount when she coughs. No blood per rectum. Weight stable   Outpatient Encounter Prescriptions as of 02/26/2016  Medication Sig  . APPLE CIDER VINEGAR PO Take by mouth daily. [place]  . aspirin 81 MG tablet Take 81 mg by mouth daily.    Marland Kitchen b complex vitamins tablet Take 1 tablet by mouth daily.  Marland Kitchen CALCIUM-MAGNESIUM-ZINC PO Take 1 tablet by mouth 3 (three) times daily.  . Cholecalciferol (VITAMIN D PO) Take 1 capsule by mouth daily.  . Glucosamine Sulfate 1000 MG TABS Take 1 tablet by mouth 2 (two) times daily.  Marland Kitchen Lysine 500 MG TABS Take by mouth daily.  . Multiple Vitamins-Minerals (CENTRUM SILVER PO) Take 1 capsule by mouth daily.    . Vinpocetine POWD 5 mg by Does not apply route 2 (two) times daily.  . vitamin E 600 UNIT capsule Take 600 Units by mouth daily.    Marland Kitchen omeprazole (PRILOSEC) 40 MG capsule Take 1 capsule (40 mg total) by mouth daily.  . [DISCONTINUED] azithromycin (ZITHROMAX Z-PAK) 250 MG tablet As directed  . [DISCONTINUED] ciprofloxacin (CIPRO) 500 MG tablet Take 1 tablet (500 mg total) by mouth 2 (two) times daily. (Patient not taking:  Reported on 02/15/2016)   No facility-administered encounter medications on file as of 02/26/2016.    Allergies as of 02/26/2016 - Review Complete 02/26/2016  Allergen Reaction Noted  . Contrast media [iodinated diagnostic agents] Other (See Comments) 08/02/2013  . Iohexol Other (See Comments) 03/24/2004    Past Medical History  Diagnosis Date  . Ataxia   . Emphysema   . Diverticulosis   . Renal cyst   . Osteopenia   . GERD (gastroesophageal reflux disease)   . Iron deficiency anemia, unspecified   . Kidney cysts 11/16/12    small cyst on left  . Nystagmus, end-position   . Gait abnormality 05/14/2013    Ms.Plett a patient of Dr. Linda Hedges presented first interpreter thousand 13 with a gait dysfunction, she also had an episodic confusion and Loss device. Exam found a mild nystagmus and she was referred to ophthalmology on 03-2812, brain MRI was normal nystagmus was a secondary diagnosis to vestibulitis, ear nose and throat has followed and had seen the patient. At the time of the nystagmus the patie  . Hepatic steatosis   . Allergy   . Asthma     sees Dr. Tiajuana Amass     Past Surgical History  Procedure Laterality Date  . Tonsillectomy    . Breast biopsy Bilateral 1970's  . Cervical spine surgery  2010  . Hernia repair  Oct '14-left, remote-right  years ago right; left done '14  . Cataract extraction Bilateral LT:02/14/13,RT:02/21/13  . Skin cancer excision  05/13/13    FACE, basal cell  . Eye surgery Bilateral 01/2013&02/2013    left then right    Family History  Problem Relation Age of Onset  . Colon cancer Father   . Coronary artery disease Father   . Heart block Father   . Pancreatic cancer Mother   . Colon cancer Paternal Grandmother   . Colon cancer Paternal Aunt     x 2  . Cancer - Lung Paternal Aunt   . Breast cancer Paternal Aunt   . Colon cancer Paternal Aunt   . Ovarian cancer Maternal Aunt   . Heart attack Paternal Grandfather     Social History    Social History  . Marital Status: Married    Spouse Name: N/A  . Number of Children: 3  . Years of Education: 16   Occupational History  . 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     Social History Main Topics  . Smoking status: Never Smoker   . Smokeless tobacco: Never Used  . Alcohol Use: No  . Drug Use: No  . Sexual Activity:    Partners: Male   Other Topics Concern  . 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 married - 1965. 3 sons - '66, '69, '70. Sons in good health. Marriage in good health. Enjoys retirement - remains active.            Review of systems: Review of Systems  Constitutional: Negative for fever and chills.  HENT: Negative.   Eyes: Negative for blurred vision.  Respiratory: Negative for cough, shortness of breath and wheezing.   Cardiovascular: Negative for chest pain and palpitations.  Gastrointestinal: as per HPI Genitourinary: Negative for dysuria, urgency, frequency and hematuria.  Musculoskeletal: Negative for myalgias, back pain and joint pain.  Skin: Negative for itching and rash.  Neurological: Negative for dizziness, tremors, focal weakness, seizures and loss of consciousness.  Endo/Heme/Allergies: Negative for environmental allergies.  Psychiatric/Behavioral: Negative for depression, suicidal ideas and hallucinations.  All other systems reviewed and are negative.   Physical Exam: Filed Vitals:   02/26/16 1317  BP: 158/90  Pulse: 80   Gen:      No acute distress, petite HEENT:  EOMI, sclera anicteric Neck:     No masses; no thyromegaly Lungs:    Clear to auscultation bilaterally; normal respiratory effort CV:         Regular rate and rhythm; no murmurs Abd:      + bowel sounds; soft, non-tender; no palpable masses, no distension Ext:    No edema; adequate peripheral perfusion Skin:      Warm and dry; no rash Neuro: alert and oriented x 3 Psych: normal mood and  affect  Data Reviewed: Colonoscopy 2015: sigmoid diverticulosis otherwise normal    Assessment and Plan/Recommendations: Chronic GERD with chronic cough and hoarseness of voice: Restart omeprazole 40 mg daily Continue antireflux measures Fecal and urinary incontinence could be related to laxity of pelvic muscle/ floor: We'll refer to Earlie Counts physical therapist for evaluation and pelvic floor physical therapy. Return in 1 year or sooner if needed  K. Denzil Magnuson , MD [phone] Mon-Fri 8a-5p [phone] after 5p, weekends, holidays

## 2016-03-07 ENCOUNTER — Telehealth: Payer: Self-pay | Admitting: Gastroenterology

## 2016-03-07 NOTE — Telephone Encounter (Signed)
Patient reports lower abdominal discomfort for a week. It has changed from a discomfort across the abdomen to mostly on the right lower quadrant. She has felt the pain the most with walking and bending over at the waist. She denies bm changes. No bloody stools, fever or nausea. She has been eating a lot of strawberries and other small seed foods. She states the pain reminds her of the last time she had diverticulitis. She is not doubled over with pain and does not feel she needs to report to the ER. No appointments this week with [facility]. Please advise.

## 2016-03-07 NOTE — Telephone Encounter (Signed)
Advised. Discussed low residue diet and acute worsening symptoms that would make it necessary to seek urgent care.

## 2016-03-07 NOTE — Telephone Encounter (Signed)
Please advise patient to reduce the fiber intake and if she continues to have pain or it worsens please ask her to reach [place], will need to consider CT abd & pelvis with contrast to further evaluate the RLQ abdominal pain

## 2016-03-08 ENCOUNTER — Ambulatory Visit: Payer: Medicare Other | Admitting: Physical Therapy

## 2016-03-08 DIAGNOSIS — J301 Allergic rhinitis due to pollen: Secondary | ICD-10-CM | POA: Diagnosis not present

## 2016-03-08 DIAGNOSIS — J3089 Other allergic rhinitis: Secondary | ICD-10-CM | POA: Diagnosis not present

## 2016-03-08 DIAGNOSIS — J3081 Allergic rhinitis due to animal (cat) (dog) hair and dander: Secondary | ICD-10-CM | POA: Diagnosis not present

## 2016-03-11 NOTE — Telephone Encounter (Signed)
Please have her start Miralax 1/2 capful daily and titrate up based on symptoms

## 2016-03-11 NOTE — Telephone Encounter (Signed)
Still having Abd Pain. Told me she was returning Beth's call. Call back to [phone]

## 2016-03-11 NOTE — Telephone Encounter (Signed)
Patient advised.

## 2016-03-11 NOTE — Telephone Encounter (Signed)
She reports some improvement though persistent symptoms. She has had a normal bowel movement today. She is thinking she may be a little constipated. She feels a little tugging or discomfort lower right abdomen when she pulls her leg up to her body. She repeats she is less sore than she was earlier in the week. She is eating low residue, taking Align and has not used any laxatives. Please advise.

## 2016-03-21 DIAGNOSIS — J3089 Other allergic rhinitis: Secondary | ICD-10-CM | POA: Diagnosis not present

## 2016-03-21 DIAGNOSIS — J3081 Allergic rhinitis due to animal (cat) (dog) hair and dander: Secondary | ICD-10-CM | POA: Diagnosis not present

## 2016-03-21 DIAGNOSIS — J301 Allergic rhinitis due to pollen: Secondary | ICD-10-CM | POA: Diagnosis not present

## 2016-03-29 DIAGNOSIS — J3089 Other allergic rhinitis: Secondary | ICD-10-CM | POA: Diagnosis not present

## 2016-03-29 DIAGNOSIS — J3081 Allergic rhinitis due to animal (cat) (dog) hair and dander: Secondary | ICD-10-CM | POA: Diagnosis not present

## 2016-03-29 DIAGNOSIS — J301 Allergic rhinitis due to pollen: Secondary | ICD-10-CM | POA: Diagnosis not present

## 2016-04-06 DIAGNOSIS — J3081 Allergic rhinitis due to animal (cat) (dog) hair and dander: Secondary | ICD-10-CM | POA: Diagnosis not present

## 2016-04-06 DIAGNOSIS — J301 Allergic rhinitis due to pollen: Secondary | ICD-10-CM | POA: Diagnosis not present

## 2016-04-06 DIAGNOSIS — J3089 Other allergic rhinitis: Secondary | ICD-10-CM | POA: Diagnosis not present

## 2016-04-11 DIAGNOSIS — J3089 Other allergic rhinitis: Secondary | ICD-10-CM | POA: Diagnosis not present

## 2016-04-11 DIAGNOSIS — J301 Allergic rhinitis due to pollen: Secondary | ICD-10-CM | POA: Diagnosis not present

## 2016-04-11 DIAGNOSIS — J3081 Allergic rhinitis due to animal (cat) (dog) hair and dander: Secondary | ICD-10-CM | POA: Diagnosis not present

## 2016-04-12 DIAGNOSIS — H5500 Unspecified nystagmus: Secondary | ICD-10-CM | POA: Diagnosis not present

## 2016-04-12 DIAGNOSIS — H52203 Unspecified astigmatism, bilateral: Secondary | ICD-10-CM | POA: Diagnosis not present

## 2016-04-12 DIAGNOSIS — Z961 Presence of intraocular lens: Secondary | ICD-10-CM | POA: Diagnosis not present

## 2016-04-15 ENCOUNTER — Ambulatory Visit (INDEPENDENT_AMBULATORY_CARE_PROVIDER_SITE_OTHER): Payer: Medicare Other | Admitting: Family Medicine

## 2016-04-15 ENCOUNTER — Encounter: Payer: Self-pay | Admitting: Family Medicine

## 2016-04-15 VITALS — BP 141/98 | HR 79 | Temp 97.8°F | Ht 60.5 in | Wt 90.0 lb

## 2016-04-15 DIAGNOSIS — R030 Elevated blood-pressure reading, without diagnosis of hypertension: Secondary | ICD-10-CM

## 2016-04-15 DIAGNOSIS — R279 Unspecified lack of coordination: Secondary | ICD-10-CM

## 2016-04-15 DIAGNOSIS — J438 Other emphysema: Secondary | ICD-10-CM

## 2016-04-15 DIAGNOSIS — D509 Iron deficiency anemia, unspecified: Secondary | ICD-10-CM

## 2016-04-15 DIAGNOSIS — E785 Hyperlipidemia, unspecified: Secondary | ICD-10-CM

## 2016-04-15 DIAGNOSIS — I10 Essential (primary) hypertension: Secondary | ICD-10-CM

## 2016-04-15 DIAGNOSIS — IMO0001 Reserved for inherently not codable concepts without codable children: Secondary | ICD-10-CM

## 2016-04-15 DIAGNOSIS — K219 Gastro-esophageal reflux disease without esophagitis: Secondary | ICD-10-CM

## 2016-04-15 LAB — CBC WITH DIFFERENTIAL/PLATELET
Basophils Absolute: 0 10*3/uL (ref 0.0–0.1)
Basophils Relative: 0.6 % (ref 0.0–3.0)
Eosinophils Absolute: 0.1 10*3/uL (ref 0.0–0.7)
Eosinophils Relative: 1.4 % (ref 0.0–5.0)
HCT: 35.6 % — ABNORMAL LOW (ref 36.0–46.0)
Hemoglobin: 11.6 g/dL — ABNORMAL LOW (ref 12.0–15.0)
Lymphocytes Relative: 20.6 % (ref 12.0–46.0)
Lymphs Abs: 1.4 10*3/uL (ref 0.7–4.0)
MCHC: 32.7 g/dL (ref 30.0–36.0)
MCV: 88.5 fl (ref 78.0–100.0)
Monocytes Absolute: 0.5 10*3/uL (ref 0.1–1.0)
Monocytes Relative: 8 % (ref 3.0–12.0)
Neutro Abs: 4.7 10*3/uL (ref 1.4–7.7)
Neutrophils Relative %: 69.4 % (ref 43.0–77.0)
Platelets: 355 10*3/uL (ref 150.0–400.0)
RBC: 4.03 Mil/uL (ref 3.87–5.11)
RDW: 14 % (ref 11.5–15.5)
WBC: 6.8 10*3/uL (ref 4.0–10.5)

## 2016-04-15 LAB — POC URINALSYSI DIPSTICK (AUTOMATED)
Bilirubin, UA: NEGATIVE
Glucose, UA: NEGATIVE
Ketones, UA: NEGATIVE
Leukocytes, UA: NEGATIVE
Nitrite, UA: NEGATIVE
Protein, UA: NEGATIVE
Spec Grav, UA: 1.015
Urobilinogen, UA: 0.2
pH, UA: 7.5

## 2016-04-15 LAB — LIPID PANEL
Cholesterol: 202 mg/dL — ABNORMAL HIGH (ref 0–200)
HDL: 75.5 mg/dL (ref >39.00)
LDL Cholesterol: 116 mg/dL — ABNORMAL HIGH (ref 0–99)
NonHDL: 126.83
Total CHOL/HDL Ratio: 3
Triglycerides: 56 mg/dL (ref 0.0–149.0)
VLDL: 11.2 mg/dL (ref 0.0–40.0)

## 2016-04-15 LAB — BASIC METABOLIC PANEL
BUN: 13 mg/dL (ref 6–23)
CO2: 32 mEq/L (ref 19–32)
Calcium: 9.8 mg/dL (ref 8.4–10.5)
Chloride: 103 mEq/L (ref 96–112)
Creatinine, Ser: 0.59 mg/dL (ref 0.40–1.20)
GFR: 105.92 mL/min (ref >60.00)
Glucose, Bld: 91 mg/dL (ref 70–99)
Potassium: 4.1 mEq/L (ref 3.5–5.1)
Sodium: 141 mEq/L (ref 135–145)

## 2016-04-15 LAB — TSH: TSH: 2.35 u[IU]/mL (ref 0.35–4.50)

## 2016-04-15 LAB — HEPATIC FUNCTION PANEL
ALT: 19 U/L (ref 0–35)
AST: 23 U/L (ref 0–37)
Albumin: 4 g/dL (ref 3.5–5.2)
Alkaline Phosphatase: 76 U/L (ref 39–117)
Bilirubin, Direct: 0.2 mg/dL (ref 0.0–0.3)
Total Bilirubin: 1.1 mg/dL (ref 0.2–1.2)
Total Protein: 6.5 g/dL (ref 6.0–8.3)

## 2016-04-15 NOTE — Progress Notes (Signed)
Pre visit review using our clinic review tool, if applicable. No additional management support is needed unless otherwise documented below in the visit note. 

## 2016-04-15 NOTE — Progress Notes (Signed)
   Subjective:    Patient ID: Kristine Francis, female    DOB: 1941/03/16, 75 y.o.   MRN: [id number]  HPI 75 yr old female to follow up on several issues. Her BP has been stable at home. She recently saw Dr. Silverio Decamp for a GI visit. She had stopped taking Omeprazole but she describes a dry cough which seems to be coming from silent GERD. She asked Tonasia to get back on the Omeprazole and she has done so. Her appetite is good. She still has some balance problems but has not fallen.    Review of Systems  Constitutional: Negative.   HENT: Negative.   Eyes: Negative.   Respiratory: Negative.   Cardiovascular: Negative.   Gastrointestinal: Negative.   Genitourinary: Negative for dysuria, urgency, frequency, hematuria, flank pain, decreased urine volume, enuresis, difficulty urinating, pelvic pain and dyspareunia.  Musculoskeletal: Negative.   Skin: Negative.   Neurological: Negative.   Psychiatric/Behavioral: Negative.        Objective:   Physical Exam  Constitutional: She is oriented to person, place, and time. She appears well-developed and well-nourished. No distress.  HENT:  Head: Normocephalic and atraumatic.  Right Ear: External ear normal.  Left Ear: External ear normal.  Nose: Nose normal.  Mouth/Throat: Oropharynx is clear and moist. No oropharyngeal exudate.  Eyes: Conjunctivae and EOM are normal. Pupils are equal, round, and reactive to light. No scleral icterus.  Neck: Normal range of motion. Neck supple. No JVD present. No thyromegaly present.  Cardiovascular: Normal rate, regular rhythm, normal heart sounds and intact distal pulses.  Exam reveals no gallop and no friction rub.   No murmur heard. EKG normal   Pulmonary/Chest: Effort normal and breath sounds normal. No respiratory distress. She has no wheezes. She has no rales. She exhibits no tenderness.  Abdominal: Soft. Bowel sounds are normal. She exhibits no distension and no mass. There is no tenderness. There is no  rebound and no guarding.  Musculoskeletal: Normal range of motion. She exhibits no edema or tenderness.  Lymphadenopathy:    She has no cervical adenopathy.  Neurological: She is alert and oriented to person, place, and time. She has normal reflexes. No cranial nerve deficit. She exhibits normal muscle tone. Coordination normal.  Skin: Skin is warm and dry. No rash noted. No erythema.  Psychiatric: She has a normal mood and affect. Her behavior is normal. Judgment and thought content normal.          Assessment & Plan:  Her BP is stable. We discussed diet and exercise advice. Get labs for a lipid level. Check the Hgb for the anemia. Her ataxia and balance problems are stable. She will stay on Omeprazole for the GERD.  [name], MD

## 2016-04-19 DIAGNOSIS — J3081 Allergic rhinitis due to animal (cat) (dog) hair and dander: Secondary | ICD-10-CM | POA: Diagnosis not present

## 2016-04-19 DIAGNOSIS — J3089 Other allergic rhinitis: Secondary | ICD-10-CM | POA: Diagnosis not present

## 2016-04-19 DIAGNOSIS — J301 Allergic rhinitis due to pollen: Secondary | ICD-10-CM | POA: Diagnosis not present

## 2016-04-25 DIAGNOSIS — J3081 Allergic rhinitis due to animal (cat) (dog) hair and dander: Secondary | ICD-10-CM | POA: Diagnosis not present

## 2016-04-25 DIAGNOSIS — J301 Allergic rhinitis due to pollen: Secondary | ICD-10-CM | POA: Diagnosis not present

## 2016-04-25 DIAGNOSIS — Z1231 Encounter for screening mammogram for malignant neoplasm of breast: Secondary | ICD-10-CM | POA: Diagnosis not present

## 2016-04-25 DIAGNOSIS — J3089 Other allergic rhinitis: Secondary | ICD-10-CM | POA: Diagnosis not present

## 2016-04-25 DIAGNOSIS — Z803 Family history of malignant neoplasm of breast: Secondary | ICD-10-CM | POA: Diagnosis not present

## 2016-05-02 DIAGNOSIS — J301 Allergic rhinitis due to pollen: Secondary | ICD-10-CM | POA: Diagnosis not present

## 2016-05-02 DIAGNOSIS — J3089 Other allergic rhinitis: Secondary | ICD-10-CM | POA: Diagnosis not present

## 2016-05-02 DIAGNOSIS — J3081 Allergic rhinitis due to animal (cat) (dog) hair and dander: Secondary | ICD-10-CM | POA: Diagnosis not present

## 2016-05-04 DIAGNOSIS — J301 Allergic rhinitis due to pollen: Secondary | ICD-10-CM | POA: Diagnosis not present

## 2016-05-04 DIAGNOSIS — J3081 Allergic rhinitis due to animal (cat) (dog) hair and dander: Secondary | ICD-10-CM | POA: Diagnosis not present

## 2016-05-04 DIAGNOSIS — J3089 Other allergic rhinitis: Secondary | ICD-10-CM | POA: Diagnosis not present

## 2016-05-09 DIAGNOSIS — J301 Allergic rhinitis due to pollen: Secondary | ICD-10-CM | POA: Diagnosis not present

## 2016-05-09 DIAGNOSIS — J3081 Allergic rhinitis due to animal (cat) (dog) hair and dander: Secondary | ICD-10-CM | POA: Diagnosis not present

## 2016-05-09 DIAGNOSIS — J3089 Other allergic rhinitis: Secondary | ICD-10-CM | POA: Diagnosis not present

## 2016-05-19 DIAGNOSIS — J3089 Other allergic rhinitis: Secondary | ICD-10-CM | POA: Diagnosis not present

## 2016-05-19 DIAGNOSIS — J301 Allergic rhinitis due to pollen: Secondary | ICD-10-CM | POA: Diagnosis not present

## 2016-05-19 DIAGNOSIS — J3081 Allergic rhinitis due to animal (cat) (dog) hair and dander: Secondary | ICD-10-CM | POA: Diagnosis not present

## 2016-05-31 DIAGNOSIS — J301 Allergic rhinitis due to pollen: Secondary | ICD-10-CM | POA: Diagnosis not present

## 2016-05-31 DIAGNOSIS — J3089 Other allergic rhinitis: Secondary | ICD-10-CM | POA: Diagnosis not present

## 2016-05-31 DIAGNOSIS — Z85828 Personal history of other malignant neoplasm of skin: Secondary | ICD-10-CM | POA: Diagnosis not present

## 2016-05-31 DIAGNOSIS — J3081 Allergic rhinitis due to animal (cat) (dog) hair and dander: Secondary | ICD-10-CM | POA: Diagnosis not present

## 2016-05-31 DIAGNOSIS — L570 Actinic keratosis: Secondary | ICD-10-CM | POA: Diagnosis not present

## 2016-05-31 DIAGNOSIS — L309 Dermatitis, unspecified: Secondary | ICD-10-CM | POA: Diagnosis not present

## 2016-05-31 DIAGNOSIS — L814 Other melanin hyperpigmentation: Secondary | ICD-10-CM | POA: Diagnosis not present

## 2016-06-08 DIAGNOSIS — J301 Allergic rhinitis due to pollen: Secondary | ICD-10-CM | POA: Diagnosis not present

## 2016-06-08 DIAGNOSIS — J3081 Allergic rhinitis due to animal (cat) (dog) hair and dander: Secondary | ICD-10-CM | POA: Diagnosis not present

## 2016-06-08 DIAGNOSIS — J3089 Other allergic rhinitis: Secondary | ICD-10-CM | POA: Diagnosis not present

## 2016-06-17 DIAGNOSIS — J3089 Other allergic rhinitis: Secondary | ICD-10-CM | POA: Diagnosis not present

## 2016-06-17 DIAGNOSIS — J3081 Allergic rhinitis due to animal (cat) (dog) hair and dander: Secondary | ICD-10-CM | POA: Diagnosis not present

## 2016-06-17 DIAGNOSIS — J301 Allergic rhinitis due to pollen: Secondary | ICD-10-CM | POA: Diagnosis not present

## 2016-06-24 DIAGNOSIS — J3089 Other allergic rhinitis: Secondary | ICD-10-CM | POA: Diagnosis not present

## 2016-06-24 DIAGNOSIS — J3081 Allergic rhinitis due to animal (cat) (dog) hair and dander: Secondary | ICD-10-CM | POA: Diagnosis not present

## 2016-06-24 DIAGNOSIS — J301 Allergic rhinitis due to pollen: Secondary | ICD-10-CM | POA: Diagnosis not present

## 2016-07-13 DIAGNOSIS — J3089 Other allergic rhinitis: Secondary | ICD-10-CM | POA: Diagnosis not present

## 2016-07-13 DIAGNOSIS — J3081 Allergic rhinitis due to animal (cat) (dog) hair and dander: Secondary | ICD-10-CM | POA: Diagnosis not present

## 2016-07-13 DIAGNOSIS — J301 Allergic rhinitis due to pollen: Secondary | ICD-10-CM | POA: Diagnosis not present

## 2016-07-15 ENCOUNTER — Ambulatory Visit (INDEPENDENT_AMBULATORY_CARE_PROVIDER_SITE_OTHER): Payer: Medicare Other | Admitting: Family Medicine

## 2016-07-15 ENCOUNTER — Encounter: Payer: Self-pay | Admitting: Family Medicine

## 2016-07-15 VITALS — BP 130/90 | HR 75 | Resp 12 | Ht 60.5 in | Wt 90.5 lb

## 2016-07-15 DIAGNOSIS — J3089 Other allergic rhinitis: Secondary | ICD-10-CM | POA: Diagnosis not present

## 2016-07-15 DIAGNOSIS — S80812A Abrasion, left lower leg, initial encounter: Secondary | ICD-10-CM

## 2016-07-15 DIAGNOSIS — J3081 Allergic rhinitis due to animal (cat) (dog) hair and dander: Secondary | ICD-10-CM | POA: Diagnosis not present

## 2016-07-15 DIAGNOSIS — J301 Allergic rhinitis due to pollen: Secondary | ICD-10-CM | POA: Diagnosis not present

## 2016-07-15 MED ORDER — DOXYCYCLINE HYCLATE 100 MG PO TABS: 100.0000 mg | ORAL_TABLET | Freq: Two times a day (BID) | ORAL | 0 refills | Status: DC

## 2016-07-15 MED ORDER — DOXYCYCLINE HYCLATE 100 MG PO TABS: 100.0000 mg | ORAL_TABLET | Freq: Two times a day (BID) | ORAL | 0 refills | Status: AC

## 2016-07-15 MED ORDER — MUPIROCIN CALCIUM 2 % EX CREA: 1.0000 "application " | TOPICAL_CREAM | Freq: Two times a day (BID) | CUTANEOUS | 0 refills | Status: AC

## 2016-07-15 MED ORDER — MUPIROCIN CALCIUM 2 % EX CREA: 1.0000 "application " | TOPICAL_CREAM | Freq: Two times a day (BID) | CUTANEOUS | 0 refills | Status: DC

## 2016-07-15 NOTE — Progress Notes (Signed)
HPI:  ACUTE VISIT:  Chief Complaint  Patient presents with  . Leg Injury    left leg, happened on monday the 14th. Pt scraped back of leg while going down stairs on vacation. Yellow discharge was coming out of scrap this morning.    Ms.Kristine Francis is a 75 y.o. female, who is here today complaining of superficial wound on calf left LE after injury suffered on  07/04/2016 when she was going down steps, apparently she rubbed area against wooden stairs.   According to patient, she didn't feel the trauma, later her daughter noted oozing blood from area. She is being applying OTC Neosporin and keep it area clean. Today morning she noted yellowish drainage.  She denies any pain, chills, fever, associated arthralgias. She noted left ankle edema.    Review of Systems  Constitutional: Negative for appetite change, chills and fever.  Respiratory: Negative for cough, shortness of breath and wheezing.   Cardiovascular: Positive for leg swelling. Negative for chest pain and palpitations.  Musculoskeletal: Negative for joint swelling and myalgias.  Skin: Positive for rash and wound.  Neurological: Negative for syncope, weakness and headaches.  Hematological: Does not bruise/bleed easily.  Psychiatric/Behavioral: Negative for confusion.      Current Outpatient Prescriptions on File Prior to Visit  Medication Sig Dispense Refill  . APPLE CIDER VINEGAR PO Take by mouth daily. Reported on 04/15/2016    . aspirin 81 MG tablet Take 81 mg by mouth daily.      Marland Kitchen b complex vitamins tablet Take 1 tablet by mouth daily.    Marland Kitchen CALCIUM-MAGNESIUM-ZINC PO Take 1 tablet by mouth 3 (three) times daily.    . Cholecalciferol (VITAMIN D PO) Take 1 capsule by mouth daily.    . Glucosamine Sulfate 1000 MG TABS Take 1 tablet by mouth daily.     Marland Kitchen Lysine 500 MG TABS Take by mouth daily.    . Multiple Vitamins-Minerals (CENTRUM SILVER PO) Take 1 capsule by mouth daily.      Marland Kitchen omeprazole (PRILOSEC) 40  MG capsule Take 1 capsule (40 mg total) by mouth daily. 90 capsule 3  . Vinpocetine POWD 5 mg by Does not apply route 2 (two) times daily.    . vitamin E 600 UNIT capsule Take 600 Units by mouth daily.       No current facility-administered medications on file prior to visit.      Past Medical History:  Diagnosis Date  . Allergy   . Asthma    sees Dr. Tiajuana Amass   . Ataxia   . Diverticulosis   . Emphysema   . Gait abnormality 05/14/2013   Ms.Nickel a patient of Dr. Linda Hedges presented first interpreter thousand 13 with a gait dysfunction, she also had an episodic confusion and Loss device. Exam found a mild nystagmus and she was referred to ophthalmology on 03-2812, brain MRI was normal nystagmus was a secondary diagnosis to vestibulitis, ear nose and throat has followed and had seen the patient. At the time of the nystagmus the patie  . GERD (gastroesophageal reflux disease)   . Hepatic steatosis   . Iron deficiency anemia, unspecified   . Kidney cysts 11/16/12   small cyst on left  . Nystagmus, end-position   . Osteopenia   . Renal cyst    Allergies  Allergen Reactions  . Contrast Media [Iodinated Diagnostic Agents] Other (See Comments)    Congestion .Marland KitchenMarland Kitchenpatient stated it irritated her eyes and voice  . Iohexol  Other (See Comments)    Gi upset    Social History   Social History  . Marital status: Married    Spouse name: N/A  . Number of children: 3  . Years of education: 73   Occupational History  . 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  Retired   Social History Main Topics  . Smoking status: Never Smoker  . Smokeless tobacco: Never Used  . Alcohol use No  . Drug use: No  . Sexual activity: Yes    Partners: Male   Other Topics Concern  . 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 married - 1965. 3 sons - '66, '69, '70. Sons in good health. Marriage in good health. Enjoys retirement - remains active.            Vitals:   07/15/16 1458  BP: 130/90  Pulse: 75  Resp: 12   Body mass index is 17.38 kg/m.    Physical Exam  Constitutional: She is oriented to person, place, and time. She appears well-developed. She does not appear ill. No distress.  HENT:  Head: Atraumatic.  Eyes: Conjunctivae are normal.  Cardiovascular:  Pulses:      Dorsalis pedis pulses are 2+ on the left side.       Posterior tibial pulses are 2+ on the left side.  Respiratory: No respiratory distress.  Musculoskeletal: She exhibits edema (trace pitting edema periankle LLE.). She exhibits no tenderness.  Left ankle and knee with adequate ROM, no pain elicited.  Neurological: She is alert and oriented to person, place, and time.  Stable gait with no assistance.  Skin: Skin is warm. Abrasion and ecchymosis noted. There is erythema.     LLE, calf rounded, 5-6 mm, not tender lesion appreciated, mild surrounded ecchymosis. Mild erythema and local heat as well as edema. No induration appreciated, active drainage, or fluctuant area.  Psychiatric: She has a normal mood and affect.  Well groomed, good eye contact.      ASSESSMENT AND PLAN:     Kristine Francis was seen today for leg injury.  Diagnoses and all orders for this visit:  Excoriation of left lower leg, initial encounter  Tetanus vaccine 2014. Mild cellulitis associated. Oral and topical  abx recommended. Some side effects of medications discussed. Stop Neosporin. LE elevation. Keep area clean with soap and water. Instructed about warning signs. F/U with PCP in 10 days,before if needed.  -     doxycycline (VIBRA-TABS) 100 MG tablet; Take 1 tablet (100 mg total) by mouth 2 (two) times daily. -     mupirocin cream (BACTROBAN) 2 %; Apply 1 application topically 2 (two) times daily.       Return in about 10 days (around 07/25/2016) for PCP LLE wound.     -Ms.Kristine Francis was advised to return or notify a doctor immediately if symptoms worsen or  persist or new concerns arise, she voices understanding.       Betty G. [place], MD  [facility]. [place] office.

## 2016-07-15 NOTE — Patient Instructions (Addendum)
  Ms.Ludwika H Ascheman I have seen you today for an acute visit because your primary care provider was not available.   1. Excoriation of left lower leg, initial encounter  - doxycycline (VIBRA-TABS) 100 MG tablet; Take 1 tablet (100 mg total) by mouth 2 (two) times daily.  Dispense: 14 tablet; Refill: 0 - mupirocin cream (BACTROBAN) 2 %; Apply 1 application topically 2 (two) times daily.  Dispense: 15 g; Refill: 0   With mild infection. Oral antibiotic recommended as well as topical antibiotic. Stop neosporin Lower extremity elevation a few times per day to control swelling. Keep area clean with soap and water.     Monitor for signs of worsening symptoms and seek immediate medical attention if any concerning/warning symptom as we discussed.  Ffollow up appointment with your doctor in 10 days, before if symptoms get worse.  Please be sure you have an appointment already scheduled with your PCP.

## 2016-07-20 DIAGNOSIS — J301 Allergic rhinitis due to pollen: Secondary | ICD-10-CM | POA: Diagnosis not present

## 2016-07-20 DIAGNOSIS — J3089 Other allergic rhinitis: Secondary | ICD-10-CM | POA: Diagnosis not present

## 2016-07-20 DIAGNOSIS — J3081 Allergic rhinitis due to animal (cat) (dog) hair and dander: Secondary | ICD-10-CM | POA: Diagnosis not present

## 2016-07-22 DIAGNOSIS — J3081 Allergic rhinitis due to animal (cat) (dog) hair and dander: Secondary | ICD-10-CM | POA: Diagnosis not present

## 2016-07-22 DIAGNOSIS — J3089 Other allergic rhinitis: Secondary | ICD-10-CM | POA: Diagnosis not present

## 2016-07-22 DIAGNOSIS — J301 Allergic rhinitis due to pollen: Secondary | ICD-10-CM | POA: Diagnosis not present

## 2016-07-29 DIAGNOSIS — J3089 Other allergic rhinitis: Secondary | ICD-10-CM | POA: Diagnosis not present

## 2016-07-29 DIAGNOSIS — J3081 Allergic rhinitis due to animal (cat) (dog) hair and dander: Secondary | ICD-10-CM | POA: Diagnosis not present

## 2016-07-29 DIAGNOSIS — J301 Allergic rhinitis due to pollen: Secondary | ICD-10-CM | POA: Diagnosis not present

## 2016-08-03 DIAGNOSIS — J3089 Other allergic rhinitis: Secondary | ICD-10-CM | POA: Diagnosis not present

## 2016-08-03 DIAGNOSIS — J3081 Allergic rhinitis due to animal (cat) (dog) hair and dander: Secondary | ICD-10-CM | POA: Diagnosis not present

## 2016-08-03 DIAGNOSIS — J301 Allergic rhinitis due to pollen: Secondary | ICD-10-CM | POA: Diagnosis not present

## 2016-08-10 DIAGNOSIS — J3089 Other allergic rhinitis: Secondary | ICD-10-CM | POA: Diagnosis not present

## 2016-08-10 DIAGNOSIS — J301 Allergic rhinitis due to pollen: Secondary | ICD-10-CM | POA: Diagnosis not present

## 2016-08-10 DIAGNOSIS — J3081 Allergic rhinitis due to animal (cat) (dog) hair and dander: Secondary | ICD-10-CM | POA: Diagnosis not present

## 2016-08-15 ENCOUNTER — Ambulatory Visit (INDEPENDENT_AMBULATORY_CARE_PROVIDER_SITE_OTHER): Payer: Medicare Other | Admitting: Family Medicine

## 2016-08-15 VITALS — BP 134/89 | HR 84 | Temp 99.3°F | Ht 60.5 in | Wt 91.0 lb

## 2016-08-15 DIAGNOSIS — L03115 Cellulitis of right lower limb: Secondary | ICD-10-CM

## 2016-08-15 DIAGNOSIS — M19041 Primary osteoarthritis, right hand: Secondary | ICD-10-CM

## 2016-08-15 DIAGNOSIS — M1990 Unspecified osteoarthritis, unspecified site: Secondary | ICD-10-CM | POA: Diagnosis not present

## 2016-08-15 MED ORDER — METHYLPREDNISOLONE 4 MG PO TBPK: ORAL_TABLET | ORAL | 0 refills | Status: DC

## 2016-08-15 MED ORDER — DOXYCYCLINE HYCLATE 100 MG PO CAPS: 100.0000 mg | ORAL_CAPSULE | Freq: Two times a day (BID) | ORAL | 0 refills | Status: AC

## 2016-08-15 NOTE — Progress Notes (Signed)
Pre visit review using our clinic review tool, if applicable. No additional management support is needed unless otherwise documented below in the visit note. 

## 2016-08-16 ENCOUNTER — Encounter: Payer: Self-pay | Admitting: Family Medicine

## 2016-08-16 NOTE — Progress Notes (Signed)
   Subjective:    Patient ID: Kristine Francis, female    DOB: 10/04/1941, 75 y.o.   MRN: [id number]  HPI Here for a lesion on the right leg that she thinks is from an insect sting or bite. She was seen here on 07-15-16 for an infection around an abrasion on the left leg and was given a course of Doxycyline. This responded well. However this past weekend while working in her yard she felt a sharp stinging pain on the right leg but never saw an insect. The area swelled up and became red and tender. She feels fine in general. She has applied ice packs to the area.    Review of Systems  Constitutional: Negative.   Skin: Positive for wound.       Objective:   Physical Exam  Constitutional: She appears well-developed and well-nourished.  Skin:  The right lower leg has an area of inuration with swelling, erythema, warmth, and tenderness          Assessment & Plan:  This is an apparent bee sting which has become infected. Cover with Doxycycline for 10 days. Recheck prn.  [name], MD

## 2016-08-19 DIAGNOSIS — J3089 Other allergic rhinitis: Secondary | ICD-10-CM | POA: Diagnosis not present

## 2016-08-19 DIAGNOSIS — J301 Allergic rhinitis due to pollen: Secondary | ICD-10-CM | POA: Diagnosis not present

## 2016-08-19 DIAGNOSIS — J3081 Allergic rhinitis due to animal (cat) (dog) hair and dander: Secondary | ICD-10-CM | POA: Diagnosis not present

## 2016-08-24 DIAGNOSIS — J301 Allergic rhinitis due to pollen: Secondary | ICD-10-CM | POA: Diagnosis not present

## 2016-08-24 DIAGNOSIS — J3089 Other allergic rhinitis: Secondary | ICD-10-CM | POA: Diagnosis not present

## 2016-08-24 DIAGNOSIS — J3081 Allergic rhinitis due to animal (cat) (dog) hair and dander: Secondary | ICD-10-CM | POA: Diagnosis not present

## 2016-08-30 DIAGNOSIS — J301 Allergic rhinitis due to pollen: Secondary | ICD-10-CM | POA: Diagnosis not present

## 2016-08-30 DIAGNOSIS — J3081 Allergic rhinitis due to animal (cat) (dog) hair and dander: Secondary | ICD-10-CM | POA: Diagnosis not present

## 2016-08-30 DIAGNOSIS — J3089 Other allergic rhinitis: Secondary | ICD-10-CM | POA: Diagnosis not present

## 2016-09-09 DIAGNOSIS — J301 Allergic rhinitis due to pollen: Secondary | ICD-10-CM | POA: Diagnosis not present

## 2016-09-09 DIAGNOSIS — J3089 Other allergic rhinitis: Secondary | ICD-10-CM | POA: Diagnosis not present

## 2016-09-09 DIAGNOSIS — J3081 Allergic rhinitis due to animal (cat) (dog) hair and dander: Secondary | ICD-10-CM | POA: Diagnosis not present

## 2016-09-14 ENCOUNTER — Encounter: Payer: Self-pay | Admitting: Family Medicine

## 2016-09-14 ENCOUNTER — Telehealth: Payer: Self-pay | Admitting: Family Medicine

## 2016-09-14 NOTE — Telephone Encounter (Signed)
Spoke with pt and she was concerned about getting her flu shot while having allergy/cold symptoms. Advised pt that she may want to wait a few days until her symptoms improve. She reschedule her flu shot for next week. Nothing further needed.

## 2016-09-14 NOTE — Telephone Encounter (Signed)
Patient Name: Kristine Francis  DOB: [birth year]    Initial Comment Caller states has a cold, asking if she should get flu shot   Nurse Assessment  Nurse: Julien Girt, RN, Almyra Free Date/Time Eilene Ghazi Time): 09/14/2016 9:35:17 AM  Confirm and document reason for call. If symptomatic, describe symptoms. You must click the next button to save text entered. ---Caller states she was coughing and still has nasal congestion. She is asking if she can still get her flu shot on Friday. No fever. Her nasal drainage is clear.  Has the patient traveled out of the country within the last 30 days? ---Not Applicable  Does the patient have any new or worsening symptoms? ---Yes  Will a triage be completed? ---Yes  Related visit to physician within the last 2 weeks? ---No  Does the PT have any chronic conditions? (i.e. diabetes, asthma, etc.) ---Yes  List chronic conditions. ---Asthma" on and off" , GERD, Seasonal Allergies/Allergy shots  Is this a behavioral health or substance abuse call? ---No     Guidelines    Guideline Title Affirmed Question Affirmed Notes  Common Cold Cold with no complications (all triage questions negative)    Final Disposition User   [place], RN, Almyra Free    Comments  We discussed with no fever or worsening of her cold sx she will most likely be able to proceed, but it will be at the discretion of Dr Sarajane Jews. Caller requests a call back to confirm she can still get the shot.   Disagree/Comply: Comply

## 2016-09-16 ENCOUNTER — Ambulatory Visit: Payer: Medicare Other

## 2016-09-20 ENCOUNTER — Ambulatory Visit (INDEPENDENT_AMBULATORY_CARE_PROVIDER_SITE_OTHER): Payer: Medicare Other | Admitting: *Deleted

## 2016-09-20 DIAGNOSIS — Z23 Encounter for immunization: Secondary | ICD-10-CM

## 2016-09-28 DIAGNOSIS — J3089 Other allergic rhinitis: Secondary | ICD-10-CM | POA: Diagnosis not present

## 2016-09-28 DIAGNOSIS — J3081 Allergic rhinitis due to animal (cat) (dog) hair and dander: Secondary | ICD-10-CM | POA: Diagnosis not present

## 2016-09-28 DIAGNOSIS — J301 Allergic rhinitis due to pollen: Secondary | ICD-10-CM | POA: Diagnosis not present

## 2016-10-04 DIAGNOSIS — J3089 Other allergic rhinitis: Secondary | ICD-10-CM | POA: Diagnosis not present

## 2016-10-04 DIAGNOSIS — J301 Allergic rhinitis due to pollen: Secondary | ICD-10-CM | POA: Diagnosis not present

## 2016-10-04 DIAGNOSIS — J3081 Allergic rhinitis due to animal (cat) (dog) hair and dander: Secondary | ICD-10-CM | POA: Diagnosis not present

## 2016-10-19 DIAGNOSIS — J3089 Other allergic rhinitis: Secondary | ICD-10-CM | POA: Diagnosis not present

## 2016-10-19 DIAGNOSIS — J301 Allergic rhinitis due to pollen: Secondary | ICD-10-CM | POA: Diagnosis not present

## 2016-10-19 DIAGNOSIS — J3081 Allergic rhinitis due to animal (cat) (dog) hair and dander: Secondary | ICD-10-CM | POA: Diagnosis not present

## 2016-10-22 ENCOUNTER — Encounter: Payer: Self-pay | Admitting: Internal Medicine

## 2016-10-22 ENCOUNTER — Ambulatory Visit (INDEPENDENT_AMBULATORY_CARE_PROVIDER_SITE_OTHER): Payer: Medicare Other | Admitting: Internal Medicine

## 2016-10-22 ENCOUNTER — Other Ambulatory Visit (HOSPITAL_COMMUNITY)
Admission: RE | Admit: 2016-10-22 | Discharge: 2016-10-22 | Disposition: A | Discharge status: Home / Self Care | Payer: Medicare Other | Source: Other Acute Inpatient Hospital | Attending: Internal Medicine | Admitting: Internal Medicine

## 2016-10-22 VITALS — BP 144/86 | HR 59 | Temp 98.3°F | Wt 91.5 lb

## 2016-10-22 DIAGNOSIS — N3000 Acute cystitis without hematuria: Secondary | ICD-10-CM

## 2016-10-22 DIAGNOSIS — R300 Dysuria: Secondary | ICD-10-CM

## 2016-10-22 LAB — POC URINALSYSI DIPSTICK (AUTOMATED)
Bilirubin, UA: NEGATIVE
Blood, UA: 200
Glucose, UA: NEGATIVE
Ketones, UA: NEGATIVE
Nitrite, UA: NEGATIVE
Spec Grav, UA: 1.01
Urobilinogen, UA: 0.2
pH, UA: 6

## 2016-10-22 MED ORDER — SULFAMETHOXAZOLE-TRIMETHOPRIM 800-160 MG PO TABS: 1.0000 | ORAL_TABLET | Freq: Two times a day (BID) | ORAL | 0 refills | Status: DC

## 2016-10-22 NOTE — Progress Notes (Signed)
Chief Complaint  Patient presents with  . Possible UTI  . Dysuria    HPI: Kristine Francis 75 y.o.   Sat clinic  Visit  For 1 day dysuria like her UTIS    started this  Am     Last one  A year from now.   Last bladder infection    noantibiotic allergy  No fever chills  Has neuropathy   ROS: See pertinent positives and negatives per HPI. No back pain  Past Medical History:  Diagnosis Date  . Allergy   . Asthma    sees Dr. Tiajuana Amass   . Ataxia   . Diverticulosis   . Emphysema   . Gait abnormality 05/14/2013   Kristine Francis a patient of Dr. Linda Hedges presented first interpreter thousand 13 with a gait dysfunction, she also had an episodic confusion and Loss device. Exam found a mild nystagmus and she was referred to ophthalmology on 03-2812, brain MRI was normal nystagmus was a secondary diagnosis to vestibulitis, ear nose and throat has followed and had seen the patient. At the time of the nystagmus the patie  . GERD (gastroesophageal reflux disease)   . Hepatic steatosis   . Iron deficiency anemia, unspecified   . Kidney cysts 11/16/12   small cyst on left  . Nystagmus, end-position   . Osteopenia   . Renal cyst     Family History  Problem Relation Age of Onset  . Colon cancer Father   . Coronary artery disease Father   . Heart block Father   . Pancreatic cancer Mother   . Colon cancer Paternal Grandmother   . Colon cancer Paternal Aunt     x 2  . Cancer - Lung Paternal Aunt   . Breast cancer Paternal Aunt   . Colon cancer Paternal Aunt   . Ovarian cancer Maternal Aunt   . Heart attack Paternal Grandfather     Social History   Social History  . Marital status: Married    Spouse name: N/A  . Number of children: 3  . Years of education: 87   Occupational History  . 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  Retired   Social History Main Topics  . Smoking status: Never Smoker  . Smokeless tobacco: Never Used  . Alcohol use No  . Drug use: No  . Sexual activity: Yes   Partners: Male   Other Topics Concern  . 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 married - 1965. 3 sons - '66, '69, '70. Sons in good health. Marriage in good health. Enjoys retirement - remains active.          Outpatient Medications Prior to Visit  Medication Sig Dispense Refill  . APPLE CIDER VINEGAR PO Take by mouth daily. Reported on 04/15/2016    . aspirin 81 MG tablet Take 81 mg by mouth daily.      Marland Kitchen b complex vitamins tablet Take 1 tablet by mouth daily.    Marland Kitchen CALCIUM-MAGNESIUM-ZINC PO Take 1 tablet by mouth 3 (three) times daily.    . Cholecalciferol (VITAMIN D PO) Take 1 capsule by mouth daily.    . Glucosamine Sulfate 1000 MG TABS Take 1 tablet by mouth daily.     Marland Kitchen Lysine 500 MG TABS Take by mouth daily.    . Multiple Vitamins-Minerals (CENTRUM SILVER PO) Take 1 capsule by mouth daily.      Marland Kitchen  omeprazole (PRILOSEC) 40 MG capsule Take 1 capsule (40 mg total) by mouth daily. 90 capsule 3  . Vinpocetine POWD 5 mg by Does not apply route 2 (two) times daily.    . vitamin E 600 UNIT capsule Take 600 Units by mouth daily.       No facility-administered medications prior to visit.      EXAM:  BP (!) 144/86 (BP Location: Right Arm, Patient Position: Sitting, Cuff Size: Normal)   Pulse (!) 59   Temp 98.3 F (36.8 C) (Oral)   Wt 91 lb 8 oz (41.5 kg)   SpO2 98%   BMI 17.58 kg/m   Body mass index is 17.58 kg/m.  GENERAL: vitals reviewed and listed above, alert, oriented, appears well hydrated and in no acute distress HEENT: atraumatic, conjunctiva  clear, no obvious abnormalities on inspection of external nose and ears NECK: no obvious masses on inspection palpation  Abdomen:  Sof,t normal bowel sounds without hepatosplenomegaly, no guarding rebound or masses no CVA tenderness CV: HRRR, no clubbing cyanosis or  peripheral edema  MS: moves all extremities without noticeable focal  Abnormality  Flat gait     PSYCH: pleasant and cooperative, no obvious depression or anxiety cognition intact  ua pos leuk  ASSESSMENT AND PLAN:  Discussed the following assessment and plan:  Dysuria - Plan: POCT Urinalysis Dipstick (Automated), Urine culture  Acute cystitis without hematuria - Plan: Urine culture Last ucx was  Proteus  Will culture  r to quinolones also  And has PN   Septra  And fu as appropriate  -Patient advised to return or notify health care team  if symptoms worsen ,persist or new concerns arise.  Patient Instructions  UTI  Beginning antibiotic  Should be better in a few days   Will be notified when culture results are back .     Standley Brooking. Panosh M.D.

## 2016-10-22 NOTE — Patient Instructions (Signed)
UTI  Beginning antibiotic  Should be better in a few days   Will be notified when culture results are back .

## 2016-10-22 NOTE — Progress Notes (Signed)
Pre visit review using our clinic review tool, if applicable. No additional management support is needed unless otherwise documented below in the visit note. 

## 2016-10-24 LAB — URINE CULTURE: Culture: >=100000 — AB

## 2016-10-25 NOTE — Progress Notes (Signed)
Tell patient that urine culture shows proteus germ  sensitive to medication given . Should resolve with current treatment   Take all 5 days   Fu if not  Better. Or recurring

## 2016-11-01 DIAGNOSIS — J301 Allergic rhinitis due to pollen: Secondary | ICD-10-CM | POA: Diagnosis not present

## 2016-11-01 DIAGNOSIS — H5310 Unspecified subjective visual disturbances: Secondary | ICD-10-CM | POA: Diagnosis not present

## 2016-11-01 DIAGNOSIS — J3081 Allergic rhinitis due to animal (cat) (dog) hair and dander: Secondary | ICD-10-CM | POA: Diagnosis not present

## 2016-11-01 DIAGNOSIS — J3089 Other allergic rhinitis: Secondary | ICD-10-CM | POA: Diagnosis not present

## 2016-11-02 ENCOUNTER — Telehealth: Payer: Self-pay | Admitting: Cardiology

## 2016-11-02 NOTE — Telephone Encounter (Signed)
Patient will need to have an OV with APP prior to making medication changes (she has not been evaluated since March 2016).  Attempted to call patient, but no VM or answer after several rings.  Will try again later.

## 2016-11-02 NOTE — Telephone Encounter (Signed)
This telephone note was incorrectly routed.  Operator routed another telephone note to the correct Provider and covering RN.

## 2016-11-02 NOTE — Telephone Encounter (Signed)
Patient states that she is seeing a dark blue haze everywhere she looks.  Her ophthalmologist suggested that is may be medicine related and that she should call us.  Patient seems very worried.

## 2016-11-03 NOTE — Telephone Encounter (Signed)
Dr. Benna Dunks office to get a copy of OV note for review. They would not release note as patient has not signed consent forms and it was not a formal referral - just a "recommendation" to see Cardiology.

## 2016-11-03 NOTE — Telephone Encounter (Signed)
Patient states last Tuesday, she received an email with her granddaughter's picture and her face looked blue. She discussed with her husband and he said her face was not blue. Later that night, litter in the corner of the litter box was light purple when she cleaned it. The visual color changes made her nervous so she scheduled an appointment with her ophthalmologist. She states he dilated her eyes and did a "full work up" and everything was normal. He instructed her to call Cardiology for evaluation.  Currently, her vision is normal and she has no other complaints.  Scheduled patient 12/18 with Estella Husk. She was grateful for call.

## 2016-11-04 DIAGNOSIS — Z1289 Encounter for screening for malignant neoplasm of other sites: Secondary | ICD-10-CM | POA: Diagnosis not present

## 2016-11-07 ENCOUNTER — Ambulatory Visit (INDEPENDENT_AMBULATORY_CARE_PROVIDER_SITE_OTHER): Payer: Medicare Other | Admitting: Physician Assistant

## 2016-11-07 ENCOUNTER — Encounter: Payer: Self-pay | Admitting: Physician Assistant

## 2016-11-07 VITALS — BP 168/90 | HR 64 | Ht 60.5 in | Wt 91.1 lb

## 2016-11-07 DIAGNOSIS — I10 Essential (primary) hypertension: Secondary | ICD-10-CM

## 2016-11-07 DIAGNOSIS — H539 Unspecified visual disturbance: Secondary | ICD-10-CM

## 2016-11-07 NOTE — Progress Notes (Signed)
Cardiology Office Note    Date:  11/07/2016   ID:  MIRTIE CAVETT, DOB 1941-06-17, MRN [id number]  PCP:  [name], MD  Cardiologist: Dr. Radford Pax  Chief Complaint  Patient presents with  . Decreased Visual Acuity    History of Present Illness:  Kristine Francis is a 75 y.o. female saw Dr. Radford Pax 01/2015 for evaluation of chest pain. She has a family history of CAD and hypertension. Stress Myoview  02/23/15 was normal. 2-D echo 02/20/16 normal LVEF 55-60% with grade 1 DD, trace of MR and TR.  Patient was added onto my schedule today because she's had some visual color changes. She saw her ophthalmologist and everything was normal.He recommended she see cardiology if she has any recurrence to get Dopplers done but she was concerned so made an appointment.   She was looking at the computer screen from an angle and noticed the child's face looked blue. Later that day when she was cleaning a litter box the litter looked like a pale purple. This concerned her. She hasn't had any symptoms since. Blood pressure is high today and was high when she saw her gynecologist on Friday. She's never been treated for hypertension. She denies any associated dizziness palpitations, chest pain, presyncope, speech impairment, or neurological changes. She has seen a neurologist in the past for peripheral neuropathy and balance issues.    Past Medical History:  Diagnosis Date  . Allergy   . Asthma    sees Dr. Tiajuana Amass   . Ataxia   . Diverticulosis   . Emphysema   . Gait abnormality 05/14/2013   Ms.Donis a patient of Dr. Linda Hedges presented first interpreter thousand 13 with a gait dysfunction, she also had an episodic confusion and Loss device. Exam found a mild nystagmus and she was referred to ophthalmology on 03-2812, brain MRI was normal nystagmus was a secondary diagnosis to vestibulitis, ear nose and throat has followed and had seen the patient. At the time of the nystagmus the patie  . GERD  (gastroesophageal reflux disease)   . Hepatic steatosis   . Iron deficiency anemia, unspecified   . Kidney cysts 11/16/12   small cyst on left  . Nystagmus, end-position   . Osteopenia   . Renal cyst     Past Surgical History:  Procedure Laterality Date  . BREAST BIOPSY Bilateral 1970's  . CATARACT EXTRACTION Bilateral LT:02/14/13,RT:02/21/13  . CERVICAL SPINE SURGERY  2010  . COLONOSCOPY  03-12-14   per Dr. Olevia Perches, diverticulosis only, repeat 5 yrs   . EYE SURGERY Bilateral 01/2013&02/2013   left then right  . HERNIA REPAIR  Oct '14-left, remote-right   years ago right; left done '14  . SKIN CANCER EXCISION  05/13/13   FACE, basal cell  . TONSILLECTOMY      Current Medications: Outpatient Medications Prior to Visit  Medication Sig Dispense Refill  . APPLE CIDER VINEGAR PO Take by mouth daily. Reported on 04/15/2016    . aspirin 81 MG tablet Take 81 mg by mouth daily.      Marland Kitchen b complex vitamins tablet Take 1 tablet by mouth daily.    Marland Kitchen CALCIUM-MAGNESIUM-ZINC PO Take 1 tablet by mouth 3 (three) times daily.    . Cholecalciferol (VITAMIN D PO) Take 1 capsule by mouth daily.    . Glucosamine Sulfate 1000 MG TABS Take 1 tablet by mouth daily.     Marland Kitchen Lysine 500 MG TABS Take by mouth daily.    . Multiple  Vitamins-Minerals (CENTRUM SILVER PO) Take 1 capsule by mouth daily.      Marland Kitchen omeprazole (PRILOSEC) 40 MG capsule Take 1 capsule (40 mg total) by mouth daily. 90 capsule 3  . Vinpocetine POWD 5 mg by Does not apply route 2 (two) times daily.    . vitamin E 400 UNIT capsule Take 400 Units by mouth daily.    Marland Kitchen sulfamethoxazole-trimethoprim (BACTRIM DS,SEPTRA DS) 800-160 MG tablet Take 1 tablet by mouth 2 (two) times daily. For UTI 10 tablet 0   No facility-administered medications prior to visit.      Allergies:   Contrast media [iodinated diagnostic agents] and Iohexol   Social History   Social History  . Marital status: Married    Spouse name: N/A  . Number of children: 3  . Years  of education: 75   Occupational History  . 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  Retired   Social History Main Topics  . Smoking status: Never Smoker  . Smokeless tobacco: Never Used  . Alcohol use No  . Drug use: No  . Sexual activity: Yes    Partners: Male   Other Topics Concern  . 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 married - 1965. 3 sons - '66, '69, '70. Sons in good health. Marriage in good health. Enjoys retirement - remains active.           Family History:  The patient's family history includes Breast cancer in her paternal aunt; Cancer - Lung in her paternal aunt; Colon cancer in her father, paternal aunt, paternal aunt, and paternal grandmother; Coronary artery disease in her father; Heart attack in her paternal grandfather; Heart block in her father; Ovarian cancer in her maternal aunt; Pancreatic cancer in her mother.   ROS:   Please see the history of present illness.    Review of Systems  Constitution: Negative.  HENT: Negative.   Eyes: Negative.   Cardiovascular: Negative.   Respiratory: Negative.   Hematologic/Lymphatic: Negative.   Musculoskeletal: Negative.  Negative for joint pain.  Gastrointestinal: Negative.   Genitourinary: Positive for frequency.  Neurological: Positive for loss of balance and sensory change.   All other systems reviewed and are negative.   PHYSICAL EXAM:   VS:  BP (!) 168/90 (BP Location: Left Arm, Patient Position: Sitting, Cuff Size: Normal)   Pulse 64   Ht 5' 0.5" (1.537 m)   Wt 91 lb 1.9 oz (41.3 kg)   SpO2 99%   BMI 17.50 kg/m   Physical Exam  GEN: Thin, in no acute distress  Neck: no JVD, carotid bruits, or masses Cardiac:RRR; no murmurs, rubs, or gallops  Respiratory:  clear to auscultation bilaterally, normal work of breathing GI: soft, nontender, nondistended, + BS Ext: without cyanosis, clubbing, or edema, Good distal pulses bilaterally MS: no  deformity or atrophy  Skin: warm and dry, no rash Neuro:  Alert and Oriented x 3, Strength and sensation are intact, off balance Psych: euthymic mood, full affect  Wt Readings from Last 3 Encounters:  11/07/16 91 lb 1.9 oz (41.3 kg)  10/22/16 91 lb 8 oz (41.5 kg)  08/15/16 91 lb (41.3 kg)      Studies/Labs Reviewed:   EKG:  EKG is not ordered today.   Recent Labs: 04/15/2016: ALT 19; BUN 13; Creatinine, Ser 0.59; Hemoglobin 11.6; Platelets 355.0; Potassium 4.1; Sodium 141; TSH 2.35   Lipid Panel  Component Value Date/Time   CHOL 202 (H) 04/15/2016 0938   TRIG 56.0 04/15/2016 0938   HDL 75.50 04/15/2016 0938   CHOLHDL 3 04/15/2016 0938   VLDL 11.2 04/15/2016 0938   LDLCALC 116 (H) 04/15/2016 0938   LDLDIRECT 129.3 12/07/2010 1110    Additional studies/ records that were reviewed today include:   2-D echo 02/20/15 Study Conclusions  - Left ventricle: The cavity size was normal. Wall thickness was   normal. Systolic function was normal. The estimated ejection   fraction was in the range of 55% to 60%. Wall motion was normal;   there were no regional wall motion abnormalities. Doppler   parameters are consistent with abnormal left ventricular   relaxation (grade 1 diastolic dysfunction).  Nuclear stress test 02/23/15  Impressions:  - Normal LV function; grade 1 diastolic dysfunction; trace MR and   TR. Overall Impression:  Normal stress nuclear study.  No evidence of ischemia.  Normal wall motion.      LV Ejection Fraction: 53%.  LV Wall Motion:  NL LV Function; NL Wall Motion.     [name], [name], MD, [facility] 02/20/2015, 4:35 PM [address],  [address] Office [phone] Pager [phone]             ASSESSMENT:    1. Visual changes   2. Essential hypertension      PLAN:  In order of problems listed above:  Visual changes with the patient saying different colors occurred on 2 different occasions on the same day. Ophthalmologist  exam was normal. Her blood pressure is elevated today and has been high on different occasions. We'll order carotid Dopplers. Asked her to return for blood pressure check but she says Dr. Barbie Banner office is much closer and she like him to follow-up her blood pressure. She agrees to see him and have it checked periodically and seek treatment through him. She also has a neurologist that she could see if she has any further visual disturbances. If Carotid Dopplers are normal follow-up with Dr. Radford Pax as previously scheduled.    Medication Adjustments/Labs and Tests Ordered: Current medicines are reviewed at length with the patient today.  Concerns regarding medicines are outlined above.  Medication changes, Labs and Tests ordered today are listed in the Patient Instructions below. Patient Instructions  Medication Instructions:  Your physician recommends that you continue on your current medications as directed. Please refer to the Current Medication list given to you today.   Labwork: None ordered  Testing/Procedures: Your physician has requested that you have a carotid duplex. This test is an ultrasound of the carotid arteries in your neck. It looks at blood flow through these arteries that supply the brain with blood. Allow one hour for this exam. There are no restrictions or special instructions.    Follow-Up: Your physician recommends that you schedule a follow-up appointment in: WITH DR. Radford Pax AS NEEDED  Please call Dr. Wandra Scot office to schedule a follow-up appointment for your blood pressure recheck and to have him monitor that.  Any Other Special Instructions Will Be Listed Below (If Applicable).   If you need a refill on your cardiac medications before your next appointment, please call your pharmacy.      Sumner Boast, PA-C  11/07/2016 2:44 PM    [facility] [place], [place], [place]  [address] Phone: [phone]; Fax: [phone]

## 2016-11-07 NOTE — Patient Instructions (Addendum)
Medication Instructions:  Your physician recommends that you continue on your current medications as directed. Please refer to the Current Medication list given to you today.   Labwork: None ordered  Testing/Procedures: Your physician has requested that you have a carotid duplex. This test is an ultrasound of the carotid arteries in your neck. It looks at blood flow through these arteries that supply the brain with blood. Allow one hour for this exam. There are no restrictions or special instructions.    Follow-Up: Your physician recommends that you schedule a follow-up appointment in: WITH DR. Radford Pax AS NEEDED  Please call Dr. Wandra Scot office to schedule a follow-up appointment for your blood pressure recheck and to have him monitor that.  Any Other Special Instructions Will Be Listed Below (If Applicable).   If you need a refill on your cardiac medications before your next appointment, please call your pharmacy.

## 2016-11-08 DIAGNOSIS — J301 Allergic rhinitis due to pollen: Secondary | ICD-10-CM | POA: Diagnosis not present

## 2016-11-08 DIAGNOSIS — J3081 Allergic rhinitis due to animal (cat) (dog) hair and dander: Secondary | ICD-10-CM | POA: Diagnosis not present

## 2016-11-08 DIAGNOSIS — J3089 Other allergic rhinitis: Secondary | ICD-10-CM | POA: Diagnosis not present

## 2016-11-16 DIAGNOSIS — J301 Allergic rhinitis due to pollen: Secondary | ICD-10-CM | POA: Diagnosis not present

## 2016-11-16 DIAGNOSIS — J3081 Allergic rhinitis due to animal (cat) (dog) hair and dander: Secondary | ICD-10-CM | POA: Diagnosis not present

## 2016-11-16 DIAGNOSIS — J3089 Other allergic rhinitis: Secondary | ICD-10-CM | POA: Diagnosis not present

## 2016-11-28 DIAGNOSIS — J3089 Other allergic rhinitis: Secondary | ICD-10-CM | POA: Diagnosis not present

## 2016-11-28 DIAGNOSIS — J3081 Allergic rhinitis due to animal (cat) (dog) hair and dander: Secondary | ICD-10-CM | POA: Diagnosis not present

## 2016-11-28 DIAGNOSIS — J301 Allergic rhinitis due to pollen: Secondary | ICD-10-CM | POA: Diagnosis not present

## 2016-12-05 ENCOUNTER — Ambulatory Visit (HOSPITAL_COMMUNITY)
Admission: RE | Admit: 2016-12-05 | Discharge: 2016-12-05 | Disposition: A | Discharge status: Home / Self Care | Payer: Medicare Other | Source: Ambulatory Visit | Attending: Cardiology | Admitting: Cardiology

## 2016-12-05 DIAGNOSIS — H539 Unspecified visual disturbance: Secondary | ICD-10-CM | POA: Insufficient documentation

## 2016-12-12 ENCOUNTER — Ambulatory Visit (INDEPENDENT_AMBULATORY_CARE_PROVIDER_SITE_OTHER): Payer: Medicare Other | Admitting: Family Medicine

## 2016-12-12 ENCOUNTER — Encounter: Payer: Self-pay | Admitting: Family Medicine

## 2016-12-12 VITALS — BP 166/107 | HR 74 | Temp 98.1°F | Ht 60.5 in | Wt 92.0 lb

## 2016-12-12 DIAGNOSIS — I10 Essential (primary) hypertension: Secondary | ICD-10-CM | POA: Diagnosis not present

## 2016-12-12 DIAGNOSIS — J3081 Allergic rhinitis due to animal (cat) (dog) hair and dander: Secondary | ICD-10-CM | POA: Diagnosis not present

## 2016-12-12 DIAGNOSIS — J3089 Other allergic rhinitis: Secondary | ICD-10-CM | POA: Diagnosis not present

## 2016-12-12 DIAGNOSIS — J301 Allergic rhinitis due to pollen: Secondary | ICD-10-CM | POA: Diagnosis not present

## 2016-12-12 LAB — CBC WITH DIFFERENTIAL/PLATELET
Basophils Absolute: 0 10*3/uL (ref 0.0–0.1)
Basophils Relative: 0.3 % (ref 0.0–3.0)
Eosinophils Absolute: 0.1 10*3/uL (ref 0.0–0.7)
Eosinophils Relative: 1.9 % (ref 0.0–5.0)
HCT: 32.7 % — ABNORMAL LOW (ref 36.0–46.0)
Hemoglobin: 10.7 g/dL — ABNORMAL LOW (ref 12.0–15.0)
Lymphocytes Relative: 32.5 % (ref 12.0–46.0)
Lymphs Abs: 1.8 10*3/uL (ref 0.7–4.0)
MCHC: 32.9 g/dL (ref 30.0–36.0)
MCV: 83.1 fl (ref 78.0–100.0)
Monocytes Absolute: 0.6 10*3/uL (ref 0.1–1.0)
Monocytes Relative: 11.3 % (ref 3.0–12.0)
Neutro Abs: 2.9 10*3/uL (ref 1.4–7.7)
Neutrophils Relative %: 54 % (ref 43.0–77.0)
Platelets: 315 10*3/uL (ref 150.0–400.0)
RBC: 3.93 Mil/uL (ref 3.87–5.11)
RDW: 15.1 % (ref 11.5–15.5)
WBC: 5.5 10*3/uL (ref 4.0–10.5)

## 2016-12-12 LAB — BASIC METABOLIC PANEL
BUN: 14 mg/dL (ref 6–23)
CO2: 31 mEq/L (ref 19–32)
Calcium: 9.7 mg/dL (ref 8.4–10.5)
Chloride: 100 mEq/L (ref 96–112)
Creatinine, Ser: 0.55 mg/dL (ref 0.40–1.20)
GFR: 114.65 mL/min (ref >60.00)
Glucose, Bld: 91 mg/dL (ref 70–99)
Potassium: 4.3 mEq/L (ref 3.5–5.1)
Sodium: 138 mEq/L (ref 135–145)

## 2016-12-12 LAB — TSH: TSH: 2.27 u[IU]/mL (ref 0.35–4.50)

## 2016-12-12 MED ORDER — LISINOPRIL 10 MG PO TABS: 10.0000 mg | ORAL_TABLET | Freq: Every day | ORAL | 3 refills | Status: DC

## 2016-12-12 NOTE — Progress Notes (Signed)
Pre visit review using our clinic review tool, if applicable. No additional management support is needed unless otherwise documented below in the visit note. 

## 2016-12-12 NOTE — Progress Notes (Signed)
   Subjective:    Patient ID: Kristine Francis, female    DOB: 08/12/1941, 76 y.o.   MRN: [id number]  HPI Here to discuss elevated BP. Over the past month or so she has been getting BP readings at home up to 168/90. She has felt fine. She had an episode for about 24 hours of intermittent bluish discoloration in her vision. Several days ago she saw the Cardiology office for this and had a normal exam, although her BP was elevated mildly. They ordered a carotid doppler which was unremarkable. They told her to follow up with [place]. Today she feels fine and her vision is back to normal.    Review of Systems  Constitutional: Negative.   Eyes: Positive for visual disturbance. Negative for photophobia, pain, discharge, redness and itching.  Respiratory: Negative.   Cardiovascular: Negative.   Neurological: Negative.        Objective:   Physical Exam  Constitutional: She is oriented to person, place, and time. She appears well-developed and well-nourished.  Neck: Neck supple. No thyromegaly present.  Cardiovascular: Normal rate, regular rhythm, normal heart sounds and intact distal pulses.   Pulmonary/Chest: Effort normal and breath sounds normal.  Lymphadenopathy:    She has no cervical adenopathy.  Neurological: She is alert and oriented to person, place, and time.          Assessment & Plan:  She has developed some HTN and we will start her on Lisinopril 10 mg daily. Get labs today including a BMET. I think her recent vision changes may have been related to high BP. Recheck in 3 weeks.  [name], MD

## 2016-12-26 ENCOUNTER — Ambulatory Visit (INDEPENDENT_AMBULATORY_CARE_PROVIDER_SITE_OTHER): Payer: Medicare Other | Admitting: Family Medicine

## 2016-12-26 ENCOUNTER — Encounter: Payer: Self-pay | Admitting: Family Medicine

## 2016-12-26 VITALS — BP 168/94 | Temp 98.2°F | Ht 60.5 in | Wt 91.0 lb

## 2016-12-26 DIAGNOSIS — I10 Essential (primary) hypertension: Secondary | ICD-10-CM

## 2016-12-26 DIAGNOSIS — J3081 Allergic rhinitis due to animal (cat) (dog) hair and dander: Secondary | ICD-10-CM | POA: Diagnosis not present

## 2016-12-26 DIAGNOSIS — J3089 Other allergic rhinitis: Secondary | ICD-10-CM | POA: Diagnosis not present

## 2016-12-26 DIAGNOSIS — J301 Allergic rhinitis due to pollen: Secondary | ICD-10-CM | POA: Diagnosis not present

## 2016-12-26 MED ORDER — LISINOPRIL-HYDROCHLOROTHIAZIDE 20-12.5 MG PO TABS: 1.0000 | ORAL_TABLET | Freq: Every day | ORAL | 3 refills | Status: DC

## 2016-12-26 NOTE — Progress Notes (Signed)
Pre visit review using our clinic review tool, if applicable. No additional management support is needed unless otherwise documented below in the visit note. 

## 2016-12-26 NOTE — Progress Notes (Signed)
   Subjective:    Patient ID: Kristine Francis, female    DOB: [birth year], 76 y.o.   MRN: [id number]  HPI Here to follow up on HTN. She has been taking Lisinopril 10 mg daily for the last 2 weeks. She feels fine. Her BP readings have come down a little, and she is averaging 150s over 90s at home.    Review of Systems  Constitutional: Negative.   Respiratory: Negative.   Cardiovascular: Negative.   Neurological: Negative.        Objective:   Physical Exam  Constitutional: She appears well-developed and well-nourished.  Cardiovascular: Normal rate, regular rhythm, normal heart sounds and intact distal pulses.   Pulmonary/Chest: Effort normal and breath sounds normal. No respiratory distress. She has no wheezes. She has no rales.          Assessment & Plan:  HTN, change to Lisinopril HCT 20/12.5 daily. Recheck in 2 weeks. [name], MD

## 2017-01-09 ENCOUNTER — Encounter: Payer: Self-pay | Admitting: Family Medicine

## 2017-01-09 ENCOUNTER — Ambulatory Visit (INDEPENDENT_AMBULATORY_CARE_PROVIDER_SITE_OTHER): Payer: Medicare Other | Admitting: Family Medicine

## 2017-01-09 VITALS — BP 160/84 | Temp 97.8°F | Ht 60.5 in | Wt 92.0 lb

## 2017-01-09 DIAGNOSIS — I10 Essential (primary) hypertension: Secondary | ICD-10-CM

## 2017-01-09 DIAGNOSIS — J301 Allergic rhinitis due to pollen: Secondary | ICD-10-CM | POA: Diagnosis not present

## 2017-01-09 DIAGNOSIS — J3089 Other allergic rhinitis: Secondary | ICD-10-CM | POA: Diagnosis not present

## 2017-01-09 DIAGNOSIS — R1319 Other dysphagia: Secondary | ICD-10-CM | POA: Diagnosis not present

## 2017-01-09 DIAGNOSIS — J3081 Allergic rhinitis due to animal (cat) (dog) hair and dander: Secondary | ICD-10-CM | POA: Diagnosis not present

## 2017-01-09 MED ORDER — AMLODIPINE BESYLATE 5 MG PO TABS: 5.0000 mg | ORAL_TABLET | Freq: Every day | ORAL | 3 refills | Status: DC

## 2017-01-09 NOTE — Progress Notes (Signed)
Pre visit review using our clinic review tool, if applicable. No additional management support is needed unless otherwise documented below in the visit note. 

## 2017-01-09 NOTE — Progress Notes (Signed)
   Subjective:    Patient ID: Kristine Francis, female    DOB: Dec 11, 1940, 76 y.o.   MRN: [id number]  HPI Here to follow up on HTN. This is the third visit she has had in the past 4 weeks. First we tried Lisinopril 10 mg daily, then we tried Lisinopril HCT 20/12.5 daily. Each time her diastolic readings have come down from 107 to 94 to now 84, but the systolic readings remain in the 160s. She feels well. She does mention occasional trouble swallowing however. Sometimes food seems to get stuck in the back of the throat and sometimes she gets choked and has to cough. She had what sounds like swallowing studies a long time ago per Dr. Maurene Capes but I cannot find these results in her chart. She did have an EGD in 2009 which revealed a normal esophagus.    Review of Systems  Constitutional: Negative.   HENT: Positive for trouble swallowing.   Respiratory: Negative.   Cardiovascular: Negative.   Gastrointestinal: Negative.   Neurological: Negative.        Objective:   Physical Exam  Constitutional: She is oriented to person, place, and time. She appears well-developed and well-nourished.  HENT:  Mouth/Throat: Oropharynx is clear and moist.  Neck: Neck supple. No thyromegaly present.  Cardiovascular: Normal rate, regular rhythm, normal heart sounds and intact distal pulses.   Pulmonary/Chest: Effort normal and breath sounds normal.  Musculoskeletal: She exhibits no edema.  Lymphadenopathy:    She has no cervical adenopathy.  Neurological: She is alert and oriented to person, place, and time.          Assessment & Plan:  For the HTN we will add Amlodipine 5 mg daily. Recheck in 2 weeks. Also we will order a swallowing study. Kristine Penna, MD

## 2017-01-19 ENCOUNTER — Other Ambulatory Visit (HOSPITAL_COMMUNITY): Payer: Self-pay | Admitting: Family Medicine

## 2017-01-19 DIAGNOSIS — R1319 Other dysphagia: Secondary | ICD-10-CM

## 2017-01-19 DIAGNOSIS — I639 Cerebral infarction, unspecified: Secondary | ICD-10-CM

## 2017-01-19 HISTORY — DX: Cerebral infarction, unspecified: I63.9

## 2017-01-23 ENCOUNTER — Encounter: Payer: Self-pay | Admitting: Family Medicine

## 2017-01-23 ENCOUNTER — Ambulatory Visit (INDEPENDENT_AMBULATORY_CARE_PROVIDER_SITE_OTHER): Payer: Medicare Other | Admitting: Family Medicine

## 2017-01-23 VITALS — BP 130/74 | Temp 97.8°F | Ht 60.5 in | Wt 92.0 lb

## 2017-01-23 DIAGNOSIS — J3089 Other allergic rhinitis: Secondary | ICD-10-CM | POA: Diagnosis not present

## 2017-01-23 DIAGNOSIS — I10 Essential (primary) hypertension: Secondary | ICD-10-CM

## 2017-01-23 DIAGNOSIS — J301 Allergic rhinitis due to pollen: Secondary | ICD-10-CM | POA: Diagnosis not present

## 2017-01-23 DIAGNOSIS — J3081 Allergic rhinitis due to animal (cat) (dog) hair and dander: Secondary | ICD-10-CM | POA: Diagnosis not present

## 2017-01-23 NOTE — Patient Instructions (Signed)
WE NOW OFFER   [facility] FAST TRACK!!!  SAME DAY Appointments for ACUTE CARE  Such as: Sprains, Injuries, cuts, abrasions, rashes, muscle pain, joint pain, back pain Colds, flu, sore throats, headache, allergies, cough, fever  Ear pain, sinus and eye infections Abdominal pain, nausea, vomiting, diarrhea, upset stomach Animal/insect bites  3 Easy Ways to Schedule: Walk-In Scheduling Call in scheduling Mychart Sign-up: [email or web address]         

## 2017-01-23 NOTE — Progress Notes (Signed)
   Subjective:    Patient ID: Kristine Francis, female    DOB: November 24, 1940, 76 y.o.   MRN: [id number]  HPI Here to follow up on HTN. At out last visit we added Amlodipine to lisnopril HCT, and this has worked very well. She feels good and her BP the last week has averaged 130s or 140s over 70s or 80s. Also she is scheduled for a swallowing study on 02-01-17.    Review of Systems  Constitutional: Negative.   Respiratory: Negative.   Cardiovascular: Negative.   Neurological: Negative.        Objective:   Physical Exam  Constitutional: She is oriented to person, place, and time. She appears well-developed and well-nourished.  Cardiovascular: Normal rate, regular rhythm, normal heart sounds and intact distal pulses.   Pulmonary/Chest: Effort normal and breath sounds normal.  Musculoskeletal: She exhibits no edema.  Neurological: She is alert and oriented to person, place, and time.          Assessment & Plan:  Her HTN is now well controlled. Stay on current regimen and recheck in 90 days. [name], MD

## 2017-01-23 NOTE — Progress Notes (Signed)
Pre visit review using our clinic review tool, if applicable. No additional management support is needed unless otherwise documented below in the visit note. 

## 2017-01-26 ENCOUNTER — Telehealth: Payer: Self-pay | Admitting: Family Medicine

## 2017-01-26 ENCOUNTER — Emergency Department (HOSPITAL_COMMUNITY): Payer: Medicare Other

## 2017-01-26 ENCOUNTER — Emergency Department (HOSPITAL_COMMUNITY)
Admission: EM | Admit: 2017-01-26 | Discharge: 2017-01-26 | Disposition: A | Discharge status: Home / Self Care | Payer: Medicare Other | Attending: Emergency Medicine | Admitting: Emergency Medicine

## 2017-01-26 ENCOUNTER — Encounter (HOSPITAL_COMMUNITY): Payer: Self-pay | Admitting: Emergency Medicine

## 2017-01-26 DIAGNOSIS — J45909 Unspecified asthma, uncomplicated: Secondary | ICD-10-CM | POA: Insufficient documentation

## 2017-01-26 DIAGNOSIS — H538 Other visual disturbances: Secondary | ICD-10-CM | POA: Insufficient documentation

## 2017-01-26 DIAGNOSIS — I639 Cerebral infarction, unspecified: Secondary | ICD-10-CM | POA: Insufficient documentation

## 2017-01-26 DIAGNOSIS — Z8673 Personal history of transient ischemic attack (TIA), and cerebral infarction without residual deficits: Secondary | ICD-10-CM

## 2017-01-26 DIAGNOSIS — H539 Unspecified visual disturbance: Secondary | ICD-10-CM | POA: Diagnosis not present

## 2017-01-26 DIAGNOSIS — E871 Hypo-osmolality and hyponatremia: Secondary | ICD-10-CM | POA: Diagnosis not present

## 2017-01-26 DIAGNOSIS — I10 Essential (primary) hypertension: Secondary | ICD-10-CM | POA: Insufficient documentation

## 2017-01-26 DIAGNOSIS — R2990 Unspecified symptoms and signs involving the nervous system: Secondary | ICD-10-CM

## 2017-01-26 DIAGNOSIS — Z79899 Other long term (current) drug therapy: Secondary | ICD-10-CM | POA: Diagnosis not present

## 2017-01-26 DIAGNOSIS — R200 Anesthesia of skin: Secondary | ICD-10-CM | POA: Diagnosis present

## 2017-01-26 LAB — COMPREHENSIVE METABOLIC PANEL
ALT: 21 U/L (ref 14–54)
AST: 33 U/L (ref 15–41)
Albumin: 4.3 g/dL (ref 3.5–5.0)
Alkaline Phosphatase: 50 U/L (ref 38–126)
Anion gap: 7 (ref 5–15)
BUN: 16 mg/dL (ref 6–20)
CO2: 28 mmol/L (ref 22–32)
Calcium: 9.2 mg/dL (ref 8.9–10.3)
Chloride: 92 mmol/L — ABNORMAL LOW (ref 101–111)
Creatinine, Ser: 0.56 mg/dL (ref 0.44–1.00)
GFR calc Af Amer: >60 mL/min (ref >60)
GFR calc non Af Amer: >60 mL/min (ref >60)
Glucose, Bld: 132 mg/dL — ABNORMAL HIGH (ref 65–99)
Potassium: 3.7 mmol/L (ref 3.5–5.1)
Sodium: 127 mmol/L — ABNORMAL LOW (ref 135–145)
Total Bilirubin: 1 mg/dL (ref 0.3–1.2)
Total Protein: 7.3 g/dL (ref 6.5–8.1)

## 2017-01-26 LAB — CBC WITH DIFFERENTIAL/PLATELET
Basophils Absolute: 0 10*3/uL (ref 0.0–0.1)
Basophils Relative: 0 %
Eosinophils Absolute: 0.1 10*3/uL (ref 0.0–0.7)
Eosinophils Relative: 1 %
HCT: 29.2 % — ABNORMAL LOW (ref 36.0–46.0)
Hemoglobin: 10.1 g/dL — ABNORMAL LOW (ref 12.0–15.0)
Lymphocytes Relative: 22 %
Lymphs Abs: 1.3 10*3/uL (ref 0.7–4.0)
MCH: 27.4 pg (ref 26.0–34.0)
MCHC: 34.6 g/dL (ref 30.0–36.0)
MCV: 79.3 fL (ref 78.0–100.0)
Monocytes Absolute: 0.8 10*3/uL (ref 0.1–1.0)
Monocytes Relative: 12 %
Neutro Abs: 4 10*3/uL (ref 1.7–7.7)
Neutrophils Relative %: 65 %
Platelets: 361 10*3/uL (ref 150–400)
RBC: 3.68 MIL/uL — ABNORMAL LOW (ref 3.87–5.11)
RDW: 14.8 % (ref 11.5–15.5)
WBC: 6.1 10*3/uL (ref 4.0–10.5)

## 2017-01-26 LAB — I-STAT TROPONIN, ED: Troponin i, poc: 0 ng/mL (ref 0.00–0.08)

## 2017-01-26 LAB — CBG MONITORING, ED: Glucose-Capillary: 133 mg/dL — ABNORMAL HIGH (ref 65–99)

## 2017-01-26 LAB — MAGNESIUM: Magnesium: 1.9 mg/dL (ref 1.7–2.4)

## 2017-01-26 MED ORDER — GADOBENATE DIMEGLUMINE 529 MG/ML IV SOLN
10.0000 mL | Freq: Once | INTRAVENOUS | Status: AC | PRN
  Administered 2017-01-26: 8 mL via INTRAVENOUS

## 2017-01-26 MED ORDER — ASPIRIN 81 MG PO TABS: 324.0000 mg | ORAL_TABLET | Freq: Every day | ORAL | 0 refills | Status: DC

## 2017-01-26 MED ORDER — LISINOPRIL 20 MG PO TABS: 20.0000 mg | ORAL_TABLET | Freq: Every day | ORAL | 0 refills | Status: DC

## 2017-01-26 MED ORDER — AMLODIPINE BESYLATE 5 MG PO TABS
10.0000 mg | ORAL_TABLET | Freq: Every day | ORAL | 3 refills | Status: DC
Start: 2017-01-26 — End: 2017-05-30

## 2017-01-26 NOTE — ED Triage Notes (Signed)
Per pt, states she got up from nap around 1430-states when she woke up she felt like she was "upside down" states both legs felt numb and heavy and could move them

## 2017-01-26 NOTE — Telephone Encounter (Signed)
Pt has checked in to [facility] ED for evaluation and treatment.

## 2017-01-26 NOTE — Telephone Encounter (Signed)
Patient Name: Kristine Francis  DOB: Aug 22, 1941    Initial Comment Caller states they were seeing things upside down and having vision problem and leg pain    Nurse Assessment  Nurse: Hammonds, RN, Epifanio Lesches Date/Time (Eastern Time): 01/26/2017 3:12:11 PM  Confirm and document reason for call. If symptomatic, describe symptoms. ---Caller states they were seeing things upside down and having vision problem and leg pain, BP 155/88. Saw Dr. Sarajane Jews on monday for BP. My vision is normal now. I have never had this happen before. When this occurred my legs wouldn't move.  Does the patient have any new or worsening symptoms? ---Yes  Will a triage be completed? ---Yes  Related visit to physician within the last 2 weeks? ---Yes  Does the PT have any chronic conditions? (i.e. diabetes, asthma, etc.) ---Yes  List chronic conditions. ---HTN Peripheral neuropathy.  Is this a behavioral health or substance abuse call? ---No     Guidelines    Guideline Title Affirmed Question Affirmed Notes  Vision Loss or Change Patient sounds very sick or weak to the triager    Final Disposition User   Go to ED Now (or PCP triage) Hammonds, RN, Lissa    Comments  Caller stated she couldn't move from cough for an undisclosed amount of time, her legs where numb and her vision distorted "room up side down" Caller denies any history of this happening to her in the past.   Referrals  [facility] - ED   Disagree/Comply: Comply

## 2017-01-26 NOTE — ED Notes (Signed)
Bed: WA17 Expected date:  Expected time:  Means of arrival:  Comments: 

## 2017-01-26 NOTE — ED Provider Notes (Signed)
[facility] DEPT Provider Note   CSN: [id number] Arrival date & time: 01/26/17  1610     History   Chief Complaint Chief Complaint  Patient presents with  . leg numbness/AMS    HPI Kristine Francis is a 76 y.o. female.  HPI   History of nystagmus for 15-20 years, feels like things go up and down when walks as a part of that, history of balance problems and peripheral neuropathy, sees chiropractor, which helps with balance issues, numbness hands and feet at baseline  Today, was feeling at baseline until 1:45PM, took a nap then woke up at 2:30PM, woke up and looked and it looked like everything was upside down when looked at everything.  Felt like couldn't move both legs, both felt numb and weak. Lasted for about 10 minutes.           Today didn't take any of supplements this AM, did not eat breakfast (just ensure)  Past Medical History:  Diagnosis Date  . Allergy   . Asthma    sees Dr. Tiajuana Amass   . Ataxia   . Diverticulosis   . Emphysema   . Gait abnormality 05/14/2013   [name] a patient of Dr. Linda Hedges presented first interpreter thousand 13 with a gait dysfunction, she also had an episodic confusion and Loss device. Exam found a mild nystagmus and she was referred to ophthalmology on 03-2812, brain MRI was normal nystagmus was a secondary diagnosis to vestibulitis, ear nose and throat has followed and had seen the patient. At the time of the nystagmus the patie  . GERD (gastroesophageal reflux disease)   . Hepatic steatosis   . Iron deficiency anemia, unspecified   . Kidney cysts 11/16/12   small cyst on left  . Nystagmus, end-position   . Osteopenia   . Renal cyst     Patient Active Problem List   Diagnosis Date Noted  . Essential hypertension 11/07/2016  . Hyperlipidemia 04/24/2015  . Dysuria 02/11/2015  . Acute cystitis without hematuria 02/11/2015  . Chest pain 02/06/2015  . Elevated BP 02/06/2015  . Left inguinal hernia 08/02/2013  . Gait  abnormality 05/14/2013  . Gait disorder   . Nystagmus, end-position   . Routine health maintenance 12/13/2011  . LEG CRAMPS, NOCTURNAL 12/07/2010  . CALLUS, TOE 03/19/2010  . hip pain 03/19/2010  . ATAXIA 11/17/2008  . EMPHYSEMA 10/21/2008  . CERVICAL RADICULOPATHY, RIGHT 10/21/2008  . DIVERTICULOSIS OF COLON 08/14/2008  . RENAL CYST 08/14/2008  . ANEMIA-IRON DEFICIENCY 06/16/2007  . Asthma 06/16/2007  . GERD 06/16/2007  . OSTEOPENIA 06/16/2007    Past Surgical History:  Procedure Laterality Date  . BREAST BIOPSY Bilateral 1970's  . CATARACT EXTRACTION Bilateral LT:02/14/13,RT:02/21/13  . CERVICAL SPINE SURGERY  2010  . COLONOSCOPY  03-12-14   per Dr. Olevia Perches, diverticulosis only, repeat 5 yrs   . EYE SURGERY Bilateral 01/2013&02/2013   left then right  . HERNIA REPAIR  Oct '14-left, remote-right   years ago right; left done '14  . SKIN CANCER EXCISION  05/13/13   FACE, basal cell  . TONSILLECTOMY      OB History    No data available       Home Medications    Prior to Admission medications   Medication Sig Start Date End Date Taking? Authorizing Provider  APPLE CIDER VINEGAR PO Take by mouth daily. Reported on 04/15/2016   Yes Historical Provider, MD  CALCIUM-MAGNESIUM-ZINC PO Take 1 tablet by mouth 3 (three) times daily.  Yes Historical Provider, MD  Cholecalciferol (VITAMIN D PO) Take 1 capsule by mouth daily.   Yes Historical Provider, MD  Cranberry 1000 MG CAPS Take 2 capsules by mouth daily.   Yes Historical Provider, MD  DHA-EPA-Vit B6-B12-Folic Acid (CARDIOVID PLUS) CAPS Take 1 capsule by mouth daily.   Yes Historical Provider, MD  Glucosamine Sulfate 1000 MG TABS Take 1 tablet by mouth daily.    Yes Historical Provider, MD  Lysine 500 MG TABS Take 1 tablet by mouth daily.    Yes Historical Provider, MD  Multiple Vitamins-Minerals (CENTRUM SILVER PO) Take 1 capsule by mouth daily.     Yes Historical Provider, MD  Nutritional Supplements (ADRENAL COMPLEX PO) Take 1  capsule by mouth daily.   Yes Historical Provider, MD  omeprazole (PRILOSEC) 40 MG capsule Take 1 capsule (40 mg total) by mouth daily. Patient taking differently: Take 40 mg by mouth daily as needed (acid reflux).  02/26/16  Yes Mauri Pole, MD  vitamin E 400 UNIT capsule Take 400 Units by mouth daily.   Yes Historical Provider, MD  amLODipine (NORVASC) 5 MG tablet Take 2 tablets (10 mg total) by mouth daily. 01/26/17   Gareth Morgan, MD  aspirin 81 MG tablet Take 4 tablets (324 mg total) by mouth daily. 01/26/17   Gareth Morgan, MD  lisinopril (PRINIVIL,ZESTRIL) 20 MG tablet Take 1 tablet (20 mg total) by mouth daily. 01/26/17   Gareth Morgan, MD  Vinpocetine POWD 5 mg by Does not apply route 2 (two) times daily.    Historical Provider, MD    Family History Family History  Problem Relation Age of Onset  . Colon cancer Father   . Coronary artery disease Father   . Heart block Father   . Pancreatic cancer Mother   . Colon cancer Paternal Grandmother   . Colon cancer Paternal Aunt     x 2  . Cancer - Lung Paternal Aunt   . Breast cancer Paternal Aunt   . Colon cancer Paternal Aunt   . Ovarian cancer Maternal Aunt   . Heart attack Paternal Grandfather     Social History Social History  Substance Use Topics  . Smoking status: Never Smoker  . Smokeless tobacco: Never Used  . Alcohol use No     Allergies   Contrast media [iodinated diagnostic agents] and Iohexol   Review of Systems Review of Systems  Constitutional: Negative for fever.  HENT: Negative for congestion.   Eyes: Positive for visual disturbance.  Respiratory: Negative for cough and shortness of breath.   Cardiovascular: Negative for chest pain.  Gastrointestinal: Negative for abdominal pain, diarrhea, nausea and vomiting.  Genitourinary: Negative for difficulty urinating and dysuria.  Musculoskeletal: Negative for back pain.  Skin: Negative for rash.  Neurological: Positive for numbness. Negative for  dizziness, tremors, syncope, facial asymmetry, speech difficulty, weakness and headaches.     Physical Exam Updated Vital Signs BP 134/83   Pulse 80   Temp 98.5 F (36.9 C) (Oral)   Resp 19   SpO2 96%   Physical Exam  Constitutional: She is oriented to person, place, and time. She appears well-developed and well-nourished. No distress.  HENT:  Head: Normocephalic and atraumatic.  Eyes: Conjunctivae and EOM are normal.  Neck: Normal range of motion.  Cardiovascular: Normal rate, regular rhythm, normal heart sounds and intact distal pulses.  Exam reveals no gallop and no friction rub.   No murmur heard. Pulmonary/Chest: Effort normal and breath sounds normal. No respiratory  distress. She has no wheezes. She has no rales.  Abdominal: Soft. She exhibits no distension. There is no tenderness. There is no guarding.  Musculoskeletal: She exhibits no edema or tenderness.  Neurological: She is alert and oriented to person, place, and time. She has normal strength. No cranial nerve deficit (nystagmus) or sensory deficit (denies). Gait (reports at baseline) abnormal. Coordination normal. GCS eye subscore is 4. GCS verbal subscore is 5. GCS motor subscore is 6.  Reflex Scores:      Patellar reflexes are 2+ on the right side and 2+ on the left side. Skin: Skin is warm and dry. No rash noted. She is not diaphoretic. No erythema.  Nursing note and vitals reviewed.    ED Treatments / Results  Labs (all labs ordered are listed, but only abnormal results are displayed) Labs Reviewed  CBC WITH DIFFERENTIAL/PLATELET - Abnormal; Notable for the following:       Result Value   RBC 3.68 (*)    Hemoglobin 10.1 (*)    HCT 29.2 (*)    All other components within normal limits  COMPREHENSIVE METABOLIC PANEL - Abnormal; Notable for the following:    Sodium 127 (*)    Chloride 92 (*)    Glucose, Bld 132 (*)    All other components within normal limits  CBG MONITORING, ED - Abnormal; Notable for the  following:    Glucose-Capillary 133 (*)    All other components within normal limits  MAGNESIUM  I-STAT TROPOININ, ED    EKG  EKG Interpretation None       Radiology Mr Jodene Nam Head Wo Contrast  Result Date: 01/26/2017 CLINICAL DATA:  76 y/o  F; stroke-like symptoms. EXAM: MRI HEAD WITHOUT AND WITH CONTRAST MRA HEAD WITHOUT MRA NECK WITHOUT AND WITH CONTRAST TECHNIQUE: Multiplanar, multiecho pulse sequences of the brain and surrounding structures were obtained without and with intravenous contrast. Angiographic images of the head were obtained using MRA technique without contrast. Angiographic images of the neck were obtained using MRA technique with and without contrast. CONTRAST:  41mL MULTIHANCE GADOBENATE DIMEGLUMINE 529 MG/ML IV SOLN COMPARISON:  07/04/2012 CT head FINDINGS: MRI HEAD FINDINGS Brain: No acute infarction, hemorrhage, hydrocephalus, extra-axial collection or mass lesion. Mild chronic microvascular ischemic changes and parenchymal volume loss of the brain for age. Several foci of susceptibility hypointensity of present within the left occipital lobe probably representing hemosiderin deposition from old microhemorrhage. There is a similar punctate focus in the right parietal subcortical white matter. Small chronic lacunar infarct within the left cerebellar hemisphere and right lentiform nucleus extending into mid corona radiata. Vascular: As below. Skull and upper cervical spine: Normal marrow signal. Sinuses/Orbits: Negative. Other: Bilateral intra-ocular lens replacement. MRA HEAD FINDINGS Mild motion artifact. Suboptimal evaluation for small aneurysm or subtle stenosis. Internal carotid arteries: Patent. 1 mm triangular outpouching of the medial left cavernous ICA is likely a infundibular origin of diminutive vessel. Anterior cerebral arteries:  Patent. Middle cerebral arteries: Patent. Anterior communicating artery: Patent. Posterior communicating arteries: Large right posterior  communicating artery and diminutive right P1 segment consistent with persistent fetal circulation. No left posterior communicating artery identified, likely hypoplastic or absent. Posterior cerebral arteries:  Patent. Basilar artery:  Patent. Vertebral arteries:  Patent. No evidence of high-grade stenosis, large vessel occlusion, or aneurysm. MRA NECK FINDINGS Aortic arch: Patent. Right common carotid artery: Patent. Right internal carotid artery: Patent. Right vertebral artery: Patent. Left common carotid artery: Patent. Left Internal carotid artery: Patent. Left Vertebral artery: Patent. There is  no evidence of high-grade stenosis, dissection, or aneurysm unless noted above. IMPRESSION: 1. No acute intracranial abnormality identified. 2. Mild chronic microvascular ischemic changes and mild parenchymal volume loss of the brain. 3. Foci of susceptibility in the left occipital lobe, probably hemosiderin deposition from old microhemorrhage. 4. Small chronic lacunar infarct within left cerebellar hemisphere and right lentiform nucleus extending into mid corona radiata. 5. No high-grade stenosis, large vessel occlusion, or aneurysm of the circle of Willis. Mild motion degraded MRA of the head. 6. No high-grade stenosis, dissection, or aneurysm of the carotid or vertebral arteries of the neck. Electronically Signed   By: Kristine Garbe M.D.   On: 01/26/2017 19:55   Mr Angiogram Neck W Or Wo Contrast  Result Date: 01/26/2017 CLINICAL DATA:  76 y/o  F; stroke-like symptoms. EXAM: MRI HEAD WITHOUT AND WITH CONTRAST MRA HEAD WITHOUT MRA NECK WITHOUT AND WITH CONTRAST TECHNIQUE: Multiplanar, multiecho pulse sequences of the brain and surrounding structures were obtained without and with intravenous contrast. Angiographic images of the head were obtained using MRA technique without contrast. Angiographic images of the neck were obtained using MRA technique with and without contrast. CONTRAST:  39mL MULTIHANCE  GADOBENATE DIMEGLUMINE 529 MG/ML IV SOLN COMPARISON:  07/04/2012 CT head FINDINGS: MRI HEAD FINDINGS Brain: No acute infarction, hemorrhage, hydrocephalus, extra-axial collection or mass lesion. Mild chronic microvascular ischemic changes and parenchymal volume loss of the brain for age. Several foci of susceptibility hypointensity of present within the left occipital lobe probably representing hemosiderin deposition from old microhemorrhage. There is a similar punctate focus in the right parietal subcortical white matter. Small chronic lacunar infarct within the left cerebellar hemisphere and right lentiform nucleus extending into mid corona radiata. Vascular: As below. Skull and upper cervical spine: Normal marrow signal. Sinuses/Orbits: Negative. Other: Bilateral intra-ocular lens replacement. MRA HEAD FINDINGS Mild motion artifact. Suboptimal evaluation for small aneurysm or subtle stenosis. Internal carotid arteries: Patent. 1 mm triangular outpouching of the medial left cavernous ICA is likely a infundibular origin of diminutive vessel. Anterior cerebral arteries:  Patent. Middle cerebral arteries: Patent. Anterior communicating artery: Patent. Posterior communicating arteries: Large right posterior communicating artery and diminutive right P1 segment consistent with persistent fetal circulation. No left posterior communicating artery identified, likely hypoplastic or absent. Posterior cerebral arteries:  Patent. Basilar artery:  Patent. Vertebral arteries:  Patent. No evidence of high-grade stenosis, large vessel occlusion, or aneurysm. MRA NECK FINDINGS Aortic arch: Patent. Right common carotid artery: Patent. Right internal carotid artery: Patent. Right vertebral artery: Patent. Left common carotid artery: Patent. Left Internal carotid artery: Patent. Left Vertebral artery: Patent. There is no evidence of high-grade stenosis, dissection, or aneurysm unless noted above. IMPRESSION: 1. No acute intracranial  abnormality identified. 2. Mild chronic microvascular ischemic changes and mild parenchymal volume loss of the brain. 3. Foci of susceptibility in the left occipital lobe, probably hemosiderin deposition from old microhemorrhage. 4. Small chronic lacunar infarct within left cerebellar hemisphere and right lentiform nucleus extending into mid corona radiata. 5. No high-grade stenosis, large vessel occlusion, or aneurysm of the circle of Willis. Mild motion degraded MRA of the head. 6. No high-grade stenosis, dissection, or aneurysm of the carotid or vertebral arteries of the neck. Electronically Signed   By: Kristine Garbe M.D.   On: 01/26/2017 19:55   Mr Jeri Cos EY Contrast  Result Date: 01/26/2017 CLINICAL DATA:  76 y/o  F; stroke-like symptoms. EXAM: MRI HEAD WITHOUT AND WITH CONTRAST MRA HEAD WITHOUT MRA NECK WITHOUT AND WITH CONTRAST  TECHNIQUE: Multiplanar, multiecho pulse sequences of the brain and surrounding structures were obtained without and with intravenous contrast. Angiographic images of the head were obtained using MRA technique without contrast. Angiographic images of the neck were obtained using MRA technique with and without contrast. CONTRAST:  32mL MULTIHANCE GADOBENATE DIMEGLUMINE 529 MG/ML IV SOLN COMPARISON:  07/04/2012 CT head FINDINGS: MRI HEAD FINDINGS Brain: No acute infarction, hemorrhage, hydrocephalus, extra-axial collection or mass lesion. Mild chronic microvascular ischemic changes and parenchymal volume loss of the brain for age. Several foci of susceptibility hypointensity of present within the left occipital lobe probably representing hemosiderin deposition from old microhemorrhage. There is a similar punctate focus in the right parietal subcortical white matter. Small chronic lacunar infarct within the left cerebellar hemisphere and right lentiform nucleus extending into mid corona radiata. Vascular: As below. Skull and upper cervical spine: Normal marrow signal.  Sinuses/Orbits: Negative. Other: Bilateral intra-ocular lens replacement. MRA HEAD FINDINGS Mild motion artifact. Suboptimal evaluation for small aneurysm or subtle stenosis. Internal carotid arteries: Patent. 1 mm triangular outpouching of the medial left cavernous ICA is likely a infundibular origin of diminutive vessel. Anterior cerebral arteries:  Patent. Middle cerebral arteries: Patent. Anterior communicating artery: Patent. Posterior communicating arteries: Large right posterior communicating artery and diminutive right P1 segment consistent with persistent fetal circulation. No left posterior communicating artery identified, likely hypoplastic or absent. Posterior cerebral arteries:  Patent. Basilar artery:  Patent. Vertebral arteries:  Patent. No evidence of high-grade stenosis, large vessel occlusion, or aneurysm. MRA NECK FINDINGS Aortic arch: Patent. Right common carotid artery: Patent. Right internal carotid artery: Patent. Right vertebral artery: Patent. Left common carotid artery: Patent. Left Internal carotid artery: Patent. Left Vertebral artery: Patent. There is no evidence of high-grade stenosis, dissection, or aneurysm unless noted above. IMPRESSION: 1. No acute intracranial abnormality identified. 2. Mild chronic microvascular ischemic changes and mild parenchymal volume loss of the brain. 3. Foci of susceptibility in the left occipital lobe, probably hemosiderin deposition from old microhemorrhage. 4. Small chronic lacunar infarct within left cerebellar hemisphere and right lentiform nucleus extending into mid corona radiata. 5. No high-grade stenosis, large vessel occlusion, or aneurysm of the circle of Willis. Mild motion degraded MRA of the head. 6. No high-grade stenosis, dissection, or aneurysm of the carotid or vertebral arteries of the neck. Electronically Signed   By: Kristine Garbe M.D.   On: 01/26/2017 19:55    Procedures Procedures (including critical care  time)  Medications Ordered in ED Medications  gadobenate dimeglumine (MULTIHANCE) injection 10 mL (8 mLs Intravenous Contrast Given 01/26/17 1934)     Initial Impression / Assessment and Plan / ED Course  I have reviewed the triage vital signs and the nursing notes.  Pertinent labs & imaging results that were available during my care of the patient were reviewed by me and considered in my medical decision making (see chart for details).     76 year old female with a history of chronic gait abnormality, nystagmus for which she seen neurology, peripheral neuropathy, hypertension who presents with concern for a 20 minute episode of visual change where everything appeared upside down. Also reported she had weakness in her bilateral legs.  Patient without back pain, no red flags of back pain, normal strength on exam, and have low suspicion for cauda equina or acute spinal compression. Labs show hyponatremia which is new since last check in January down to 127, which may contribute to feeling of generalized weakness. This also may have been brought on by recent hydrochlorothiazide  use.   Ordered MRI of brain, an MRA head and neck to evaluate for possible vertebrobasilar insufficiency given previous case report seen with these types of symptoms. Discussed unusual symptoms with Dr. Shon Hale. Do not feel pt requires admission if there is no acute infarct.  MR shows chronic left cerebellar lacunar infarct and an area of susceptability in left occiptal lobe. No acute findings.  Recommend PCP follow up regarding this, increase ASA to 325mg .   Regarding hyponatremia, pt asymptomatic at this time and feel this is likely a change over time. Suspect from dehydration and medication. Will discontinue hctz and increase amlodipine. Discussed reasons to return and recommend pt follow up closely with her physician regarding recheck of labs and BP.  Final Clinical Impressions(s) / ED Diagnoses   Final diagnoses:   Visual changes  Hyponatremia  Cerebral infarction, chronic    New Prescriptions Discharge Medication List as of 01/26/2017  8:46 PM    START taking these medications   Details  lisinopril (PRINIVIL,ZESTRIL) 20 MG tablet Take 1 tablet (20 mg total) by mouth daily., Starting Thu 01/26/2017, Print         Gareth Morgan, MD 01/27/17 [phone]

## 2017-01-26 NOTE — ED Notes (Signed)
Pt is out of room at this time in MRI

## 2017-02-01 ENCOUNTER — Ambulatory Visit (HOSPITAL_COMMUNITY)
Admission: RE | Admit: 2017-02-01 | Discharge: 2017-02-01 | Disposition: A | Discharge status: Home / Self Care | Payer: Medicare Other | Source: Ambulatory Visit | Attending: Family Medicine | Admitting: Family Medicine

## 2017-02-01 DIAGNOSIS — M8580 Other specified disorders of bone density and structure, unspecified site: Secondary | ICD-10-CM | POA: Diagnosis not present

## 2017-02-01 DIAGNOSIS — Z981 Arthrodesis status: Secondary | ICD-10-CM | POA: Diagnosis not present

## 2017-02-01 DIAGNOSIS — Z85828 Personal history of other malignant neoplasm of skin: Secondary | ICD-10-CM | POA: Insufficient documentation

## 2017-02-01 DIAGNOSIS — K219 Gastro-esophageal reflux disease without esophagitis: Secondary | ICD-10-CM | POA: Insufficient documentation

## 2017-02-01 DIAGNOSIS — J45909 Unspecified asthma, uncomplicated: Secondary | ICD-10-CM | POA: Diagnosis not present

## 2017-02-01 DIAGNOSIS — K5790 Diverticulosis of intestine, part unspecified, without perforation or abscess without bleeding: Secondary | ICD-10-CM | POA: Insufficient documentation

## 2017-02-01 DIAGNOSIS — N281 Cyst of kidney, acquired: Secondary | ICD-10-CM | POA: Insufficient documentation

## 2017-02-01 DIAGNOSIS — J439 Emphysema, unspecified: Secondary | ICD-10-CM | POA: Insufficient documentation

## 2017-02-01 DIAGNOSIS — R1319 Other dysphagia: Secondary | ICD-10-CM | POA: Diagnosis present

## 2017-02-02 ENCOUNTER — Encounter: Payer: Self-pay | Admitting: Gastroenterology

## 2017-02-02 ENCOUNTER — Ambulatory Visit (INDEPENDENT_AMBULATORY_CARE_PROVIDER_SITE_OTHER): Payer: Medicare Other | Admitting: Family Medicine

## 2017-02-02 ENCOUNTER — Encounter: Payer: Self-pay | Admitting: Family Medicine

## 2017-02-02 VITALS — BP 110/64 | HR 86 | Temp 98.2°F | Wt 92.2 lb

## 2017-02-02 DIAGNOSIS — R300 Dysuria: Secondary | ICD-10-CM | POA: Diagnosis not present

## 2017-02-02 DIAGNOSIS — R350 Frequency of micturition: Secondary | ICD-10-CM

## 2017-02-02 LAB — POC URINALSYSI DIPSTICK (AUTOMATED)
Bilirubin, UA: NEGATIVE
Glucose, UA: NEGATIVE
Ketones, UA: NEGATIVE
Nitrite, UA: NEGATIVE
Spec Grav, UA: 1.025 (ref 1.030–1.035)
Urobilinogen, UA: 0.2 (ref ?–2.0)
pH, UA: 5 (ref 5.0–8.0)

## 2017-02-02 MED ORDER — AMOXICILLIN-POT CLAVULANATE 875-125 MG PO TABS: 1.0000 | ORAL_TABLET | Freq: Two times a day (BID) | ORAL | 0 refills | Status: DC

## 2017-02-02 NOTE — Progress Notes (Signed)
Pre visit review using our clinic review tool, if applicable. No additional management support is needed unless otherwise documented below in the visit note. 

## 2017-02-02 NOTE — Patient Instructions (Addendum)
Please complete antibiotic as directed and follow up if symptoms do not improve in 3 to 4 days, worsen, or you develop a fever >100.   Urinary Tract Infection, Adult A urinary tract infection (UTI) is an infection of any part of the urinary tract, which includes the kidneys, ureters, bladder, and urethra. These organs make, store, and get rid of urine in the body. UTI can be a bladder infection (cystitis) or kidney infection (pyelonephritis). What are the causes? This infection may be caused by fungi, viruses, or bacteria. Bacteria are the most common cause of UTIs. This condition can also be caused by repeated incomplete emptying of the bladder during urination. What increases the risk? This condition is more likely to develop if:  You ignore your need to urinate or hold urine for long periods of time.  You do not empty your bladder completely during urination.  You wipe back to front after urinating or having a bowel movement, if you are female.  You are uncircumcised, if you are female.  You are constipated.  You have a urinary catheter that stays in place (indwelling).  You have a weak defense (immune) system.  You have a medical condition that affects your bowels, kidneys, or bladder.  You have diabetes.  You take antibiotic medicines frequently or for long periods of time, and the antibiotics no longer work well against certain types of infections (antibiotic resistance).  You take medicines that irritate your urinary tract.  You are exposed to chemicals that irritate your urinary tract.  You are female. What are the signs or symptoms? Symptoms of this condition include:  Fever.  Frequent urination or passing small amounts of urine frequently.  Needing to urinate urgently.  Pain or burning with urination.  Urine that smells bad or unusual.  Cloudy urine.  Pain in the lower abdomen or back.  Trouble urinating.  Blood in the urine.  Vomiting or being less  hungry than normal.  Diarrhea or abdominal pain.  Vaginal discharge, if you are female. How is this diagnosed? This condition is diagnosed with a medical history and physical exam. You will also need to provide a urine sample to test your urine. Other tests may be done, including:  Blood tests.  Sexually transmitted disease (STD) testing. If you have had more than one UTI, a cystoscopy or imaging studies may be done to determine the cause of the infections. How is this treated? Treatment for this condition often includes a combination of two or more of the following:  Antibiotic medicine.  Other medicines to treat less common causes of UTI.  Over-the-counter medicines to treat pain.  Drinking enough water to stay hydrated. Follow these instructions at home:  Take over-the-counter and prescription medicines only as told by your health care provider.  If you were prescribed an antibiotic, take it as told by your health care provider. Do not stop taking the antibiotic even if you start to feel better.  Avoid alcohol, caffeine, tea, and carbonated beverages. They can irritate your bladder.  Drink enough fluid to keep your urine clear or pale yellow.  Keep all follow-up visits as told by your health care provider. This is important.  Make sure to:  Empty your bladder often and completely. Do not hold urine for long periods of time.  Empty your bladder before and after sex.  Wipe from front to back after a bowel movement if you are female. Use each tissue one time when you wipe. Contact a health care  provider if:  You have back pain.  You have a fever.  You feel nauseous or vomit.  Your symptoms do not get better after 3 days.  Your symptoms go away and then return. Get help right away if:  You have severe back pain or lower abdominal pain.  You are vomiting and cannot keep down any medicines or water. This information is not intended to replace advice given to you  by your health care provider. Make sure you discuss any questions you have with your health care provider. Document Released: 08/17/2005 Document Revised: 04/20/2016 Document Reviewed: 09/28/2015 Elsevier Interactive Patient Education  2017 [facility] FAST TRACK!!!  SAME DAY Appointments for ACUTE CARE  Such as: Sprains, Injuries, cuts, abrasions, rashes, muscle pain, joint pain, back pain Colds, flu, sore throats, headache, allergies, cough, fever  Ear pain, sinus and eye infections Abdominal pain, nausea, vomiting, diarrhea, upset stomach Animal/insect bites  3 Easy Ways to Schedule: Walk-In Scheduling Call in scheduling Mychart Sign-up: [email or web address]

## 2017-02-02 NOTE — Progress Notes (Signed)
Patient ID: CHEROKEE BOCCIO, female   DOB: 1940/12/05, 76 y.o.   MRN: [id number]    PCP: [name], MD  Subjective:  Kristine Francis is a 76 y.o. year old very pleasant female patient who presents with Urinary Tract symptoms: symptoms including dysuria, frequency, and urgency. -started:one day ago   symptoms are not worsening. -previous treatments: Drinking water and AZO   ROS-denies fever, chills, sweats, N/V, flank pain, or blood in urine  Pertinent Past Medical History- HTN, Asthma, GERD, and renal cyst   Medications- reviewed  Current Outpatient Prescriptions  Medication Sig Dispense Refill  . amLODipine (NORVASC) 5 MG tablet Take 2 tablets (10 mg total) by mouth daily. 30 tablet 3  . APPLE CIDER VINEGAR PO Take by mouth daily. Reported on 04/15/2016    . aspirin 81 MG tablet Take 4 tablets (324 mg total) by mouth daily. 30 tablet 0  . CALCIUM-MAGNESIUM-ZINC PO Take 1 tablet by mouth 3 (three) times daily.    . Cholecalciferol (VITAMIN D PO) Take 1 capsule by mouth daily.    . Cranberry 1000 MG CAPS Take 2 capsules by mouth daily.    . DHA-EPA-Vit B6-B12-Folic Acid (CARDIOVID PLUS) CAPS Take 1 capsule by mouth daily.    . Glucosamine Sulfate 1000 MG TABS Take 1 tablet by mouth daily.     Marland Kitchen lisinopril (PRINIVIL,ZESTRIL) 20 MG tablet Take 1 tablet (20 mg total) by mouth daily. 30 tablet 0  . Lysine 500 MG TABS Take 1 tablet by mouth daily.     . Multiple Vitamins-Minerals (CENTRUM SILVER PO) Take 1 capsule by mouth daily.      . Nutritional Supplements (ADRENAL COMPLEX PO) Take 1 capsule by mouth daily.    Marland Kitchen omeprazole (PRILOSEC) 40 MG capsule Take 1 capsule (40 mg total) by mouth daily. (Patient taking differently: Take 40 mg by mouth daily as needed (acid reflux). ) 90 capsule 3  . Vinpocetine POWD 5 mg by Does not apply route 2 (two) times daily.    . vitamin E 400 UNIT capsule Take 400 Units by mouth daily.     No current facility-administered medications for this visit.      Objective: BP 110/64 (BP Location: Left Arm, Patient Position: Sitting, Cuff Size: Normal)   Pulse 86   Temp 98.2 F (36.8 C) (Oral)   Wt 92 lb 3.2 oz (41.8 kg)   SpO2 98%   BMI 17.71 kg/m  Gen: NAD, resting comfortably HEENT: Turbinates erythematous, TM normal, pharynx mildly erythematous with no tonsilar exudate or edema, no sinus tenderness CV: RRR no murmurs rubs or gallops Lungs: CTAB no crackles, wheeze, rhonchi Abdomen: soft/nontender/nondistended/normal bowel sounds. No rebound or guarding.  No CVA tenderness.   No Suprapubic tenderness  Ext: no edema Skin: warm, dry, no rash Neuro: grossly normal, moves all extremities  Assessment/Plan:  Dysuria UA indicates  1+ Leukocytes, negative for nitrites. With exam and history of symptoms will treat and culture. Empiric treatment with Augmentin  Advised patient to complete antibiotic and she should follow up if her symptoms do not improve in 2 to 3 days, worsen, she develops a fever >101, or back pain. Also advised her to force fluids and she can use OTC Azo for symptom relief temporarily if needed. Patient voiced understanding and agreed with plan.   Finally, we reviewed reasons to return to care including if symptoms worsen or persist or new concerns arise- once again particularly fever, N/V, or flank pain.  Laurita Quint, FNP

## 2017-02-04 LAB — URINE CULTURE

## 2017-02-06 ENCOUNTER — Encounter: Payer: Self-pay | Admitting: Family Medicine

## 2017-02-06 ENCOUNTER — Ambulatory Visit (INDEPENDENT_AMBULATORY_CARE_PROVIDER_SITE_OTHER): Payer: Medicare Other | Admitting: Family Medicine

## 2017-02-06 VITALS — BP 130/70 | Temp 97.7°F | Ht 60.5 in | Wt 91.0 lb

## 2017-02-06 DIAGNOSIS — H539 Unspecified visual disturbance: Secondary | ICD-10-CM | POA: Insufficient documentation

## 2017-02-06 DIAGNOSIS — J301 Allergic rhinitis due to pollen: Secondary | ICD-10-CM | POA: Diagnosis not present

## 2017-02-06 DIAGNOSIS — I10 Essential (primary) hypertension: Secondary | ICD-10-CM | POA: Diagnosis not present

## 2017-02-06 DIAGNOSIS — N3000 Acute cystitis without hematuria: Secondary | ICD-10-CM

## 2017-02-06 DIAGNOSIS — J3081 Allergic rhinitis due to animal (cat) (dog) hair and dander: Secondary | ICD-10-CM | POA: Diagnosis not present

## 2017-02-06 DIAGNOSIS — J3089 Other allergic rhinitis: Secondary | ICD-10-CM | POA: Diagnosis not present

## 2017-02-06 NOTE — Progress Notes (Signed)
Pre visit review using our clinic review tool, if applicable. No additional management support is needed unless otherwise documented below in the visit note. 

## 2017-02-06 NOTE — Patient Instructions (Signed)
WE NOW OFFER   [facility] FAST TRACK!!!  SAME DAY Appointments for ACUTE CARE  Such as: Sprains, Injuries, cuts, abrasions, rashes, muscle pain, joint pain, back pain Colds, flu, sore throats, headache, allergies, cough, fever  Ear pain, sinus and eye infections Abdominal pain, nausea, vomiting, diarrhea, upset stomach Animal/insect bites  3 Easy Ways to Schedule: Walk-In Scheduling Call in scheduling Mychart Sign-up: [email or web address]         

## 2017-02-06 NOTE — Progress Notes (Signed)
   Subjective:    Patient ID: Kristine Francis, female    DOB: [birth year], 76 y.o.   MRN: [id number]  HPI Here to follow up an ER visit on 01-26-17 for an episode that occurred just after she awoke from a nap. She said her visual fields were upside down, she felt generalized numbness, and her legs would not move correctly. This lasted about 20 minutes and then resolved. At the ER her exam was normal except for her baseline nystagmus. Labs were remarkable for the low sodium at 127. 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 That day the HCTZ was removed from her medications and she was told to follow up with [place]. Of note she has also had several epsiodes in the past few months where her visual fields were odd colors, one time it was blue and other it was purple. She has seen her Ophthalmologist and was told her eyes are fine. Finally she was here last week for UTI symptoms and was started on Augmentin. The urine culture did not isolate a bacteria, but her symptoms of urgency and burning are totally resolved now.    Review of Systems  Eyes: Positive for visual disturbance. Negative for photophobia.  Respiratory: Negative.   Cardiovascular: Negative.   Gastrointestinal: Negative.   Genitourinary: Negative.   Neurological: Positive for weakness. Negative for speech difficulty, numbness and headaches.       Objective:   Physical Exam  Constitutional: She is oriented to person, place, and time. She appears well-developed and well-nourished. No distress.  HENT:  Head: Normocephalic and atraumatic.  Eyes: Conjunctivae and EOM are normal. Pupils are equal, round, and reactive to light.  Neck: Neck supple. No thyromegaly present.  Cardiovascular: Normal rate, regular rhythm, normal heart sounds and intact distal pulses.   Pulmonary/Chest: Effort normal and breath sounds normal.  Abdominal: Soft. Bowel sounds are normal.  She exhibits no distension and no mass. There is no tenderness. There is no guarding.  Lymphadenopathy:    She has no cervical adenopathy.  Neurological: She is alert and oriented to person, place, and time. No cranial nerve deficit.          Assessment & Plan:  She has had several recent episodes of altered vision with no clear etiology having been found. Perhaps these were caused by seizures. We will refer her back to her Neurologist, Dr. Carles Collet, to evaluate further. Her UTI appears to be resolved.  [name], MD

## 2017-02-07 DIAGNOSIS — J3081 Allergic rhinitis due to animal (cat) (dog) hair and dander: Secondary | ICD-10-CM | POA: Diagnosis not present

## 2017-02-07 DIAGNOSIS — J301 Allergic rhinitis due to pollen: Secondary | ICD-10-CM | POA: Diagnosis not present

## 2017-02-07 DIAGNOSIS — J3089 Other allergic rhinitis: Secondary | ICD-10-CM | POA: Diagnosis not present

## 2017-02-07 NOTE — Progress Notes (Signed)
Kristine Francis was seen today in neurologic consultation at the request of [name], MD.  The patient presents today to discuss gait instability.  She has a very long and complicated history.  I reviewed records that are available to me, although I am missing multiple records as well.  She has been seen at [facility] neurology in the past for the same concern and limited records are available from Dr. [name].  She was last seen there in 2014.  The patient has had MRIs of the brain in 2004 in 2008 (which are not available to me) for concerns about gait instability and those were apparently unremarkable.  She also had an MRI of the brain on 04/19/2012 at [facility] and I do have that report.  This was reported to be unremarkable with the exception of scattered T2 hyperintensities.  The patient reports that there really has been no known etiology for her gait instability.  During the workup of her gait instability, nystagmus was noted and thought that perhaps that was in etiology.  She saw [facility] and that was felt to be just chronic latent nystagmus.  She saw Dr. [name], neuro-ophthalmologist, at [facility] and he did not see any concerns besides for her nystagmus, which she felt was peripheral in nature.  She believes that her symptoms started 15 years ago.  She reports that symptoms have progressively gotten worse over the years.  She states that when she is walking her dogs she feels that trees and the light poles are going up and down.  She will run into walls when walking.  She does occasionally have falls when walking her dog, when walking up a slight incline.  When carrying groceries in both hands, she has had a fall.  She can no longer shower and wash her hair in the shower and chooses to bathe instead and wash her hair that way. She cannot pray at church while standing without holding onto a pew.  Her feet feel normal.  She is most balanced when she uses a cart at [employer].  She does  state that she has cramping in the feet but she is taking Mg++, Ca++ and Zn++ supplements for that.  She cannot wear even a low heel because it "makes me off balance."  No family history of similar.  She did go to PT in 2013/2014 and it helped somewhat.  No EMG test.    02/08/17 update:  The patient is seen today in follow-up.  I have not seen her in several years.  She was seen in the emergency room on 01/26/2017 and I reviewed those records.  She went after awakening from a nap and feeling like everything was upside down when she looked out into her surroundings.  She felt like she had difficulty moving both legs.  She felt numb and weak.  The episode lasted perhaps 10 minutes.  She was evaluated in the emergency room.  Workup was positive only for hyponatremia of 127.  This was fairly new for her and felt brought on by her hydrochlorothiazide.  MRI of the brain and MRA of the brain were performed and reviewed by me and were nonacute.  There was very mild cerebral small vessel disease.  There was likely old hemosiderin deposit in the left occipital lobe.  There was no high-grade stenosis intracranially or extracranially.  The patient was seen by Dr. [name] on 02/03/2017 for follow-up.  She told him that she had had several  episodes in which she saw color changes in her visual field.  The patient states that it was only one episode in December, when she was looking at a picture. She states that she was looking at the picture and her nieces face looked blue but nothing else in the surroundings did.  Then later on her cat litter looked lavender and it was gray.  This never happened again until the March 8 episode.   She saw her ophthalmologist and was told that her vision was fine.   The ophthalmologist thought that it was probably a BP issue as her BP was quite elevated.   Dr. Sarajane Jews felt perhaps these were episodes of seizures.    ALLERGIES:   Allergies  Allergen Reactions  . Contrast Media [Iodinated Diagnostic  Agents] Other (See Comments)    Congestion .Marland KitchenMarland Kitchenpatient stated it irritated her eyes and voice  . Iohexol Other (See Comments)    Gi upset    CURRENT MEDICATIONS:  Outpatient Encounter Prescriptions as of 02/08/2017  Medication Sig  . amLODipine (NORVASC) 5 MG tablet Take 2 tablets (10 mg total) by mouth daily.  Marland Kitchen amoxicillin-clavulanate (AUGMENTIN) 875-125 MG tablet Take 1 tablet by mouth 2 (two) times daily.  . APPLE CIDER VINEGAR PO Take by mouth daily. Reported on 04/15/2016  . aspirin 325 MG tablet Take 325 mg by mouth daily.  Marland Kitchen CALCIUM-MAGNESIUM-ZINC PO Take 1 tablet by mouth 3 (three) times daily.  . Cholecalciferol (VITAMIN D PO) Take 1 capsule by mouth daily.  . Cranberry 1000 MG CAPS Take 2 capsules by mouth daily.  . DHA-EPA-Vit B6-B12-Folic Acid (CARDIOVID PLUS) CAPS Take 1 capsule by mouth daily.  . Glucosamine Sulfate 1000 MG TABS Take 1 tablet by mouth daily.   Marland Kitchen lisinopril (PRINIVIL,ZESTRIL) 20 MG tablet Take 1 tablet (20 mg total) by mouth daily.  Marland Kitchen Lysine 500 MG TABS Take 1 tablet by mouth daily.   . Multiple Vitamins-Minerals (CENTRUM SILVER PO) Take 1 capsule by mouth daily.    . Nutritional Supplements (ADRENAL COMPLEX PO) Take 1 capsule by mouth daily.  Marland Kitchen omeprazole (PRILOSEC) 40 MG capsule Take 1 capsule (40 mg total) by mouth daily. (Patient taking differently: Take 40 mg by mouth daily as needed (acid reflux). )  . Vinpocetine POWD 5 mg by Does not apply route 2 (two) times daily.  . vitamin E 400 UNIT capsule Take 400 Units by mouth daily.  . [DISCONTINUED] aspirin 81 MG tablet Take 4 tablets (324 mg total) by mouth daily.   No facility-administered encounter medications on file as of 02/08/2017.     PAST MEDICAL HISTORY:   Past Medical History:  Diagnosis Date  . Allergy   . Asthma    sees Dr. Tiajuana Amass   . Ataxia   . Diverticulosis   . Emphysema   . Gait abnormality 05/14/2013   Ms.Weight a patient of Dr. Linda Hedges presented first interpreter thousand 13  with a gait dysfunction, she also had an episodic confusion and Loss device. Exam found a mild nystagmus and she was referred to ophthalmology on 03-2812, brain MRI was normal nystagmus was a secondary diagnosis to vestibulitis, ear nose and throat has followed and had seen the patient. At the time of the nystagmus the patie  . GERD (gastroesophageal reflux disease)   . Hepatic steatosis   . Iron deficiency anemia, unspecified   . Kidney cysts 11/16/12   small cyst on left  . Nystagmus, end-position   . Osteopenia   . Renal  cyst     PAST SURGICAL HISTORY:   Past Surgical History:  Procedure Laterality Date  . BREAST BIOPSY Bilateral 1970's  . CATARACT EXTRACTION Bilateral LT:02/14/13,RT:02/21/13  . CERVICAL SPINE SURGERY  2010  . COLONOSCOPY  03-12-14   per Dr. Olevia Perches, diverticulosis only, repeat 5 yrs   . EYE SURGERY Bilateral 01/2013&02/2013   left then right  . HERNIA REPAIR  Oct '14-left, remote-right   years ago right; left done '14  . SKIN CANCER EXCISION  05/13/13   FACE, basal cell  . TONSILLECTOMY      SOCIAL HISTORY:   Social History   Social History  . Marital status: Married    Spouse name: N/A  . Number of children: 3  . Years of education: 68   Occupational History  . 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  Retired   Social History Main Topics  . Smoking status: Never Smoker  . Smokeless tobacco: Never Used  . Alcohol use No  . Drug use: No  . Sexual activity: Yes    Partners: Male   Other Topics Concern  . 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 married - 1965. 3 sons - '66, '69, '70. Sons in good health. Marriage in good health. Enjoys retirement - remains active.          FAMILY HISTORY:   Family Status  Relation Status  . Father Deceased at age 22   heart ds  . Mother Deceased at age 20   pancreatic cancer  . Sister Alive   3, healthy  . Child Alive   3, healthy  . Paternal  Grandmother   . Paternal Aunt   . Paternal Aunt   . Maternal Aunt   . Paternal Grandfather     ROS:  A complete 10 system review of systems was obtained and was unremarkable apart from what is mentioned above.  PHYSICAL EXAMINATION:    VITALS:   Vitals:   02/08/17 0818  BP: 138/82  Pulse: 80  SpO2: 98%  Weight: 91 lb (41.3 kg)  Height: 5\' 1"  (1.549 m)    GEN:  Normal appears female in no acute distress.  Appears stated age. HEENT:  Normocephalic, atraumatic. The mucous membranes are moist. The superficial temporal arteries are without ropiness or tenderness. Cardiovascular: Regular rate and rhythm. Lungs: Clear to auscultation bilaterally. Neck/Heme: There are no carotid bruits noted bilaterally.  NEUROLOGICAL: Orientation:  The patient is alert and oriented x 3.  Cranial nerves: There is good facial symmetry. . Extraocular muscles are intact with significant horizontal nystagmus (none vertical) and visual fields are full to confrontational testing.There is no diplopia elicited.  Speech is fluent and clear.  She has some pseudobulbar speech.  Soft palate rises symmetrically and there is no tongue deviation. Hearing is intact to conversational tone. Tone: Tone is good throughout. Sensation: Sensation is intact to light touch and pinprick throughout (facial, trunk, extremities).  Pinprick is decreased in the lower extremities compared to that of the upper extremities.  Vibration is markedly decreased in a distal fashion, both in the upper and lower extremities.  There is no extinction with double simultaneous stimulation. Coordination:  The patient has no difficulty with RAM's.  Some trouble with Finger to nose with eyes closed.   Motor: Strength is 5/5 in the bilateral upper and lower extremities.  Shoulder shrug is equal and symmetric. There  is no pronator drift.  There are no fasciculations noted. DTR's: Deep tendon reflexes are 1/4 at the bilateral biceps, triceps,  brachioradialis, 2/4 at the bilateral patella and absent at the bilateral achilles.  Plantar responses are downgoing bilaterally. Gait and Station: The patient is wide-based and unsteady.  She is ataxic.   She is unable to ambulate in a tandem fashion.    Labs:  Lab Results  Component Value Date   VITAMINB12 1,326 (H) 08/24/2015      Chemistry      Component Value Date/Time   NA 127 (L) 01/26/2017 1640   K 3.7 01/26/2017 1640   CL 92 (L) 01/26/2017 1640   CO2 28 01/26/2017 1640   BUN 16 01/26/2017 1640   CREATININE 0.56 01/26/2017 1640      Component Value Date/Time   CALCIUM 9.2 01/26/2017 1640   ALKPHOS 50 01/26/2017 1640   AST 33 01/26/2017 1640   ALT 21 01/26/2017 1640   BILITOT 1.0 01/26/2017 1640     Lab Results  Component Value Date   TSH 2.27 12/12/2016   Lab Results  Component Value Date   HGBA1C 6.0 08/24/2015     IMPRESSION/PLAN  1.  Episodes of visual disturbance/color change in vision  -I think that seizure is low on the differential diagnosis, but we will go ahead and get an EEG.  -She has been evaluated by ophthalmology and told that everything looks good from that regard.  -Explained to the patient that I really am not sure what this phenomenon was.  It does not sound like a migrainous phenomenon, as this would not be limited to just having the face of an object colored strangely, but rather everything would be colored strangely.  -I do think it is possible that her emergency room episode was related to her hyponatremia from hydrochlorothiazide.  She is off of that now.  -She recently had an MRI of the brain and this was unremarkable. 2.  Chronic nystagmus with ataxia and development of mild pseudobulbar speech.  She has very significant peripheral neuropathy as well.  -She has had a long-standing workup by neuro-ophthalmology and ENT.    -Going to test her for SCA, particularly SCA-3, although no fam hx of such, just to make sure not missing  anything. 3.  We will call her with the results of the above testing.  Hold on driving for now since doesn't feel comfortable.  Talked about safety with ambulation.  I think needs ambulatory assistive device.  Much greater than 50% of this visit was spent in counseling and coordinating care.  Total face to face time:  30 min

## 2017-02-08 ENCOUNTER — Ambulatory Visit (INDEPENDENT_AMBULATORY_CARE_PROVIDER_SITE_OTHER): Payer: Medicare Other | Admitting: Neurology

## 2017-02-08 ENCOUNTER — Encounter: Payer: Self-pay | Admitting: Neurology

## 2017-02-08 VITALS — BP 138/82 | HR 80 | Ht 61.0 in | Wt 91.0 lb

## 2017-02-08 DIAGNOSIS — G609 Hereditary and idiopathic neuropathy, unspecified: Secondary | ICD-10-CM | POA: Diagnosis not present

## 2017-02-08 DIAGNOSIS — R270 Ataxia, unspecified: Secondary | ICD-10-CM

## 2017-02-08 DIAGNOSIS — H539 Unspecified visual disturbance: Secondary | ICD-10-CM | POA: Diagnosis not present

## 2017-02-08 NOTE — Patient Instructions (Signed)
We will send off lab to [facility]  We will schedule EEG

## 2017-02-13 DIAGNOSIS — J301 Allergic rhinitis due to pollen: Secondary | ICD-10-CM | POA: Diagnosis not present

## 2017-02-13 DIAGNOSIS — J3089 Other allergic rhinitis: Secondary | ICD-10-CM | POA: Diagnosis not present

## 2017-02-13 DIAGNOSIS — J3081 Allergic rhinitis due to animal (cat) (dog) hair and dander: Secondary | ICD-10-CM | POA: Diagnosis not present

## 2017-02-22 ENCOUNTER — Other Ambulatory Visit: Payer: Self-pay | Admitting: Family Medicine

## 2017-02-22 MED ORDER — LISINOPRIL 20 MG PO TABS: 20.0000 mg | ORAL_TABLET | Freq: Every day | ORAL | 3 refills | Status: DC

## 2017-02-22 NOTE — Telephone Encounter (Signed)
Refill request for Lisinopril 20 mg take 1 po qd and send to [facility], which I did.

## 2017-02-24 DIAGNOSIS — J301 Allergic rhinitis due to pollen: Secondary | ICD-10-CM | POA: Diagnosis not present

## 2017-02-24 DIAGNOSIS — J3081 Allergic rhinitis due to animal (cat) (dog) hair and dander: Secondary | ICD-10-CM | POA: Diagnosis not present

## 2017-02-24 DIAGNOSIS — J3089 Other allergic rhinitis: Secondary | ICD-10-CM | POA: Diagnosis not present

## 2017-02-27 ENCOUNTER — Ambulatory Visit (INDEPENDENT_AMBULATORY_CARE_PROVIDER_SITE_OTHER): Payer: Medicare Other | Admitting: Neurology

## 2017-02-27 ENCOUNTER — Telehealth: Payer: Self-pay | Admitting: Neurology

## 2017-02-27 DIAGNOSIS — J3081 Allergic rhinitis due to animal (cat) (dog) hair and dander: Secondary | ICD-10-CM | POA: Diagnosis not present

## 2017-02-27 DIAGNOSIS — G9340 Encephalopathy, unspecified: Secondary | ICD-10-CM

## 2017-02-27 DIAGNOSIS — H539 Unspecified visual disturbance: Secondary | ICD-10-CM

## 2017-02-27 DIAGNOSIS — J3089 Other allergic rhinitis: Secondary | ICD-10-CM | POA: Diagnosis not present

## 2017-02-27 DIAGNOSIS — J301 Allergic rhinitis due to pollen: Secondary | ICD-10-CM | POA: Diagnosis not present

## 2017-02-27 NOTE — Telephone Encounter (Signed)
-----   Message from Crouch, DO sent at 02/27/2017  4:00 PM EDT ----- Please let pt know that EEG is normal.  Did she get SCA testing done?

## 2017-02-27 NOTE — Procedures (Signed)
TECHNICAL SUMMARY:  A multichannel referential and bipolar montage EEG using the standard international 10-20 system was performed on the patient described as awake, drowsy and asleep.  The dominant background activity consists of 9 hertz activity seen most prominantly over the posterior head region.  The backgound activity is reactive to eye opening and closing procedures.  Low voltage fast (beta) activity is distributed symmetrically and maximally over the anterior head regions.  ACTIVATION:  Stepwise photic stimulation at 4-20 flashes per second was performed and did not elicit any abnormal waveforms.  Hyperventilation was performed for 3 minutes with good patient effort and produced no changes in the background activity.  EPILEPTIFORM ACTIVITY:  There were no spikes, sharp waves or paroxysmal activity.  SLEEP:  Stage I and stage II sleep architecture were identified.  CARDIAC:  The EKG lead revealed a regular sinus rhythm.  IMPRESSION:  This is a normal EEG for the patients stated age.  There were no focal, hemispheric or lateralizing features.  No epileptiform activity was recorded.  A normal EEG does not exclude the diagnosis of a seizure disorder and if seizure remains high on the list of differential diagnosis, an ambulatory EEG may be of value.  Clinical correlation is required.

## 2017-02-27 NOTE — Telephone Encounter (Signed)
Left message on machine for patient to call back.

## 2017-02-28 NOTE — Telephone Encounter (Signed)
Patient made aware of EEG results.   She just got the kit from [place] last week and is planning on having blood work drawn soon.   Made her aware we will call her when we get the results back.

## 2017-03-03 DIAGNOSIS — J301 Allergic rhinitis due to pollen: Secondary | ICD-10-CM | POA: Diagnosis not present

## 2017-03-03 DIAGNOSIS — J3081 Allergic rhinitis due to animal (cat) (dog) hair and dander: Secondary | ICD-10-CM | POA: Diagnosis not present

## 2017-03-03 DIAGNOSIS — J3089 Other allergic rhinitis: Secondary | ICD-10-CM | POA: Diagnosis not present

## 2017-03-06 DIAGNOSIS — J301 Allergic rhinitis due to pollen: Secondary | ICD-10-CM | POA: Diagnosis not present

## 2017-03-06 DIAGNOSIS — J3081 Allergic rhinitis due to animal (cat) (dog) hair and dander: Secondary | ICD-10-CM | POA: Diagnosis not present

## 2017-03-06 DIAGNOSIS — J3089 Other allergic rhinitis: Secondary | ICD-10-CM | POA: Diagnosis not present

## 2017-03-08 DIAGNOSIS — J3081 Allergic rhinitis due to animal (cat) (dog) hair and dander: Secondary | ICD-10-CM | POA: Diagnosis not present

## 2017-03-08 DIAGNOSIS — J301 Allergic rhinitis due to pollen: Secondary | ICD-10-CM | POA: Diagnosis not present

## 2017-03-08 DIAGNOSIS — J3089 Other allergic rhinitis: Secondary | ICD-10-CM | POA: Diagnosis not present

## 2017-03-22 DIAGNOSIS — J3081 Allergic rhinitis due to animal (cat) (dog) hair and dander: Secondary | ICD-10-CM | POA: Diagnosis not present

## 2017-03-22 DIAGNOSIS — J301 Allergic rhinitis due to pollen: Secondary | ICD-10-CM | POA: Diagnosis not present

## 2017-03-22 DIAGNOSIS — J3089 Other allergic rhinitis: Secondary | ICD-10-CM | POA: Diagnosis not present

## 2017-03-28 ENCOUNTER — Telehealth: Payer: Self-pay | Admitting: Neurology

## 2017-03-28 NOTE — Telephone Encounter (Signed)
Let pt know that her athena testing was negative for the most common hereditary ataxias.  Happy to send her to tertiary care center for opinion

## 2017-03-29 NOTE — Telephone Encounter (Signed)
Spoke with patient and she states she will go to [facility] for opinion, but still wants to come in to discuss with Dr. Carles Collet. She is aware Dr. Carles Collet has nothing more to offer but wants an appt anyway. Appt made. Referral sent to [facility] to Dr. Mervyn Skeeters.

## 2017-04-04 ENCOUNTER — Ambulatory Visit (INDEPENDENT_AMBULATORY_CARE_PROVIDER_SITE_OTHER): Payer: Medicare Other | Admitting: Gastroenterology

## 2017-04-04 ENCOUNTER — Encounter: Payer: Self-pay | Admitting: Gastroenterology

## 2017-04-04 VITALS — BP 100/60 | HR 80 | Ht 61.0 in | Wt 91.2 lb

## 2017-04-04 DIAGNOSIS — K5900 Constipation, unspecified: Secondary | ICD-10-CM | POA: Diagnosis not present

## 2017-04-04 DIAGNOSIS — K219 Gastro-esophageal reflux disease without esophagitis: Secondary | ICD-10-CM | POA: Diagnosis not present

## 2017-04-04 DIAGNOSIS — Z800 Family history of malignant neoplasm of digestive organs: Secondary | ICD-10-CM | POA: Diagnosis not present

## 2017-04-04 MED ORDER — OMEPRAZOLE 40 MG PO CPDR: 40.0000 mg | DELAYED_RELEASE_CAPSULE | Freq: Every day | ORAL | 3 refills | Status: DC

## 2017-04-04 MED ORDER — RANITIDINE HCL 150 MG PO TABS: 150.0000 mg | ORAL_TABLET | Freq: Every day | ORAL | 3 refills | Status: DC

## 2017-04-04 NOTE — Patient Instructions (Signed)
We have sent in Omeprazole and Zantac to your pharmacy  Follow up in 1 year

## 2017-04-04 NOTE — Progress Notes (Signed)
Kristine Francis    [id number]    09/07/1941  Primary Care Physician:Fry, Ishmael Holter, MD  Referring Physician: [name], MD [place], [place] [address]  Chief complaint:  GERD, Constipation, fecal incontinence   HPI: 76 year old female with history of chronic GERD, constipation with intermittent fecal incontinence here for follow-up visit. Patient has no specific GI complaints. GERD and constipation stable. Patient is not having episodes of fecal incontinence. She is complaining of weakness in her lower extremity with imbalance. She is following with neurologist Dr. tablet and has had extensive testing that's been unrevealing for any significant pathology.  Colonoscopy 2015 was normal. Denies any nausea, vomiting, abdominal pain, melena or bright red blood per rectum     Outpatient Encounter Prescriptions as of 04/04/2017  Medication Sig  . amLODipine (NORVASC) 5 MG tablet Take 2 tablets (10 mg total) by mouth daily.  . APPLE CIDER VINEGAR PO Take by mouth daily. Reported on 04/15/2016  . aspirin 325 MG tablet Take 325 mg by mouth daily.  Marland Kitchen CALCIUM-MAGNESIUM-ZINC PO Take 1 tablet by mouth 3 (three) times daily.  . Cholecalciferol (VITAMIN D PO) Take 1 capsule by mouth daily.  . Cranberry 1000 MG CAPS Take 2 capsules by mouth daily.  . DHA-EPA-Vit B6-B12-Folic Acid (CARDIOVID PLUS) CAPS Take 1 capsule by mouth daily.  . Glucosamine Sulfate 1000 MG TABS Take 1 tablet by mouth daily.   Marland Kitchen lisinopril (PRINIVIL,ZESTRIL) 20 MG tablet Take 1 tablet (20 mg total) by mouth daily.  Marland Kitchen Lysine 500 MG TABS Take 1 tablet by mouth daily.   . Multiple Vitamins-Minerals (CENTRUM SILVER PO) Take 1 capsule by mouth daily.    . Nutritional Supplements (ADRENAL COMPLEX PO) Take 1 capsule by mouth daily.  Marland Kitchen omeprazole (PRILOSEC) 40 MG capsule Take 1 capsule (40 mg total) by mouth daily. (Patient taking differently: Take 40 mg by mouth daily as needed (acid reflux). )    . Vinpocetine POWD 5 mg by Does not apply route 2 (two) times daily.  . vitamin E 400 UNIT capsule Take 400 Units by mouth daily.  . [DISCONTINUED] amoxicillin-clavulanate (AUGMENTIN) 875-125 MG tablet Take 1 tablet by mouth 2 (two) times daily. (Patient not taking: Reported on 04/04/2017)   No facility-administered encounter medications on file as of 04/04/2017.     Allergies as of 04/04/2017 - Review Complete 02/08/2017  Allergen Reaction Noted  . Contrast media [iodinated diagnostic agents] Other (See Comments) 08/02/2013  . Iohexol Other (See Comments) 03/24/2004    Past Medical History:  Diagnosis Date  . Allergy   . Asthma    sees Dr. Tiajuana Amass   . Ataxia   . Diverticulosis   . Emphysema   . Gait abnormality 05/14/2013   Ms.Adduci a patient of Dr. Linda Hedges presented first interpreter thousand 13 with a gait dysfunction, she also had an episodic confusion and Loss device. Exam found a mild nystagmus and she was referred to ophthalmology on 03-2812, brain MRI was normal nystagmus was a secondary diagnosis to vestibulitis, ear nose and throat has followed and had seen the patient. At the time of the nystagmus the patie  . GERD (gastroesophageal reflux disease)   . Hepatic steatosis   . Iron deficiency anemia, unspecified   . Kidney cysts 11/16/12   small cyst on left  . Nystagmus, end-position   . Osteopenia   . Renal cyst     Past Surgical History:  Procedure  Laterality Date  . BREAST BIOPSY Bilateral 1970's  . CATARACT EXTRACTION Bilateral LT:02/14/13,RT:02/21/13  . CERVICAL SPINE SURGERY  2010  . COLONOSCOPY  03-12-14   per Dr. Olevia Perches, diverticulosis only, repeat 5 yrs   . EYE SURGERY Bilateral 01/2013&02/2013   left then right  . HERNIA REPAIR  Oct '14-left, remote-right   years ago right; left done '14  . SKIN CANCER EXCISION  05/13/13   FACE, basal cell  . TONSILLECTOMY      Family History  Problem Relation Age of Onset  . Colon cancer Father   . Coronary artery  disease Father   . Heart block Father   . Pancreatic cancer Mother   . Colon cancer Paternal Grandmother   . Colon cancer Paternal Aunt        x 2  . Cancer - Lung Paternal Aunt   . Breast cancer Paternal Aunt   . Colon cancer Paternal Aunt   . Ovarian cancer Maternal Aunt   . Heart attack Paternal Grandfather     Social History   Social History  . Marital status: Married    Spouse name: N/A  . Number of children: 3  . Years of education: 17   Occupational History  . 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  Retired   Social History Main Topics  . Smoking status: Never Smoker  . Smokeless tobacco: Never Used  . Alcohol use No  . Drug use: No  . Sexual activity: Yes    Partners: Male   Other Topics Concern  . 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 married - 1965. 3 sons - '66, '69, '70. Sons in good health. Marriage in good health. Enjoys retirement - remains active.            Review of systems: Review of Systems  Constitutional: Negative for fever and chills.  HENT: Negative.   Eyes: Negative for blurred vision.  Respiratory: Negative for cough, shortness of breath and wheezing.   Cardiovascular: Negative for chest pain and palpitations.  Gastrointestinal: as per HPI Genitourinary: Negative for dysuria, urgency, frequency and hematuria.  Musculoskeletal: Negative for myalgias, back pain and joint pain.  Skin: Negative for itching and rash.  Neurological: Negative for dizziness, tremors, focal weakness, seizures and loss of consciousness.  Endo/Heme/Allergies: Positive for seasonal allergies.  Psychiatric/Behavioral: Negative for depression, suicidal ideas and hallucinations.  All other systems reviewed and are negative.   Physical Exam: Vitals:   04/04/17 1345  BP: 100/60  Pulse: 80   Body mass index is 17.23 kg/m. Gen:      No acute distress HEENT:  EOMI, sclera anicteric Neck:      No masses; no thyromegaly Lungs:    Clear to auscultation bilaterally; normal respiratory effort CV:         Regular rate and rhythm; no murmurs Abd:      + bowel sounds; soft, non-tender; no palpable masses, no distension Ext:    No edema; adequate peripheral perfusion Skin:      Warm and dry; no rash Neuro: alert and oriented x 3 Psych: normal mood and affect  Data Reviewed:  Reviewed labs, radiology imaging, old records and pertinent past GI work up   Assessment and Plan/Recommendations:  76 year old female with history of chronic GERD, constipation here for annual follow-up visit  GERD: Discussed lifestyle modification and antireflux measures Continue omeprazole daily and  Zantac at bedtime as needed  Constipation: Improved with dietary changes with increased dietary fiber and fluid intake  Colorectal cancer screening: Increased risk with family history of colon cancer Normal colonoscopy 2015 Due for screening colonoscopy in April 2020  Return as needed  15 minutes was spent face-to-face with the patient. Greater than 50% of the time used for counseling GERD, constipation as well as treatment plan and follow-up. She had multiple questions which were answered to her satisfaction     Damaris Hippo , MD [phone] Mon-Fri 8a-5p [phone] after 5p, weekends, holidays  CC: [name], MD

## 2017-04-05 DIAGNOSIS — J3081 Allergic rhinitis due to animal (cat) (dog) hair and dander: Secondary | ICD-10-CM | POA: Diagnosis not present

## 2017-04-05 DIAGNOSIS — J3089 Other allergic rhinitis: Secondary | ICD-10-CM | POA: Diagnosis not present

## 2017-04-05 DIAGNOSIS — J301 Allergic rhinitis due to pollen: Secondary | ICD-10-CM | POA: Diagnosis not present

## 2017-04-06 ENCOUNTER — Telehealth: Payer: Self-pay | Admitting: Gastroenterology

## 2017-04-06 MED ORDER — OMEPRAZOLE 40 MG PO CPDR: 40.0000 mg | DELAYED_RELEASE_CAPSULE | Freq: Every day | ORAL | 3 refills | Status: DC

## 2017-04-06 NOTE — Telephone Encounter (Signed)
Patient aware omeprazole [facility]

## 2017-04-18 DIAGNOSIS — H5500 Unspecified nystagmus: Secondary | ICD-10-CM | POA: Diagnosis not present

## 2017-04-18 DIAGNOSIS — Z961 Presence of intraocular lens: Secondary | ICD-10-CM | POA: Diagnosis not present

## 2017-04-18 DIAGNOSIS — H52203 Unspecified astigmatism, bilateral: Secondary | ICD-10-CM | POA: Diagnosis not present

## 2017-04-21 ENCOUNTER — Ambulatory Visit: Payer: Medicare Other | Admitting: Neurology

## 2017-04-24 ENCOUNTER — Ambulatory Visit (INDEPENDENT_AMBULATORY_CARE_PROVIDER_SITE_OTHER): Payer: Medicare Other | Admitting: Family Medicine

## 2017-04-24 ENCOUNTER — Encounter: Payer: Self-pay | Admitting: Family Medicine

## 2017-04-24 VITALS — BP 112/64 | Temp 98.3°F | Ht 61.0 in | Wt 92.0 lb

## 2017-04-24 DIAGNOSIS — D508 Other iron deficiency anemias: Secondary | ICD-10-CM

## 2017-04-24 DIAGNOSIS — J3081 Allergic rhinitis due to animal (cat) (dog) hair and dander: Secondary | ICD-10-CM | POA: Diagnosis not present

## 2017-04-24 DIAGNOSIS — R609 Edema, unspecified: Secondary | ICD-10-CM

## 2017-04-24 DIAGNOSIS — J301 Allergic rhinitis due to pollen: Secondary | ICD-10-CM | POA: Diagnosis not present

## 2017-04-24 DIAGNOSIS — J3089 Other allergic rhinitis: Secondary | ICD-10-CM | POA: Diagnosis not present

## 2017-04-24 DIAGNOSIS — I10 Essential (primary) hypertension: Secondary | ICD-10-CM

## 2017-04-24 DIAGNOSIS — K219 Gastro-esophageal reflux disease without esophagitis: Secondary | ICD-10-CM

## 2017-04-24 LAB — BASIC METABOLIC PANEL
BUN: 18 mg/dL (ref 6–23)
CO2: 27 mEq/L (ref 19–32)
Calcium: 9.2 mg/dL (ref 8.4–10.5)
Chloride: 95 mEq/L — ABNORMAL LOW (ref 96–112)
Creatinine, Ser: 0.53 mg/dL (ref 0.40–1.20)
GFR: 119.54 mL/min (ref >60.00)
Glucose, Bld: 87 mg/dL (ref 70–99)
Potassium: 4.3 mEq/L (ref 3.5–5.1)
Sodium: 129 mEq/L — ABNORMAL LOW (ref 135–145)

## 2017-04-24 NOTE — Patient Instructions (Signed)
WE NOW OFFER   [facility] FAST TRACK!!!  SAME DAY Appointments for ACUTE CARE  Such as: Sprains, Injuries, cuts, abrasions, rashes, muscle pain, joint pain, back pain Colds, flu, sore throats, headache, allergies, cough, fever  Ear pain, sinus and eye infections Abdominal pain, nausea, vomiting, diarrhea, upset stomach Animal/insect bites  3 Easy Ways to Schedule: Walk-In Scheduling Call in scheduling Mychart Sign-up: [email or web address]         

## 2017-04-24 NOTE — Progress Notes (Signed)
   Subjective:    Patient ID: Kristine Francis, female    DOB: 01-29-1941, 76 y.o.   MRN: [id number]  HPI Here to follow up on HTN. Her BP at home has been stable in the range of 185T to 093J systolic most of the time. She had been taking HCTZ but this was stopped a few months ago because her sodium level was low. She feels well now except she has daily swelling in the ankles and feet. No SOB.   Review of Systems  Constitutional: Negative.   Respiratory: Negative.   Cardiovascular: Positive for leg swelling. Negative for chest pain and palpitations.  Neurological: Negative.        Objective:   Physical Exam  Constitutional: She appears well-developed and well-nourished.  Cardiovascular: Normal rate, regular rhythm, normal heart sounds and intact distal pulses.   Pulmonary/Chest: Effort normal and breath sounds normal. No respiratory distress. She has no wheezes. She has no rales.  Musculoskeletal:  2+ edema in both ankles          Assessment & Plan:  Her HTN is stable so we will continue current meds. For the ankle edema she will take Lasix 20 mg daily. Check a BMET today. [name], MD

## 2017-04-26 ENCOUNTER — Telehealth: Payer: Self-pay | Admitting: Family Medicine

## 2017-04-26 MED ORDER — FUROSEMIDE 20 MG PO TABS: 20.0000 mg | ORAL_TABLET | Freq: Every day | ORAL | 5 refills | Status: DC

## 2017-04-26 NOTE — Telephone Encounter (Signed)
Kristine Francis pt would like to speak with you on a personal matter

## 2017-04-26 NOTE — Telephone Encounter (Signed)
Per Dr. Sarajane Jews order Lasix 20 mg take 1 po qd # 30 with 5 refills. I spoke with pt and sent script e-scribe to [facility].

## 2017-04-28 DIAGNOSIS — Z1231 Encounter for screening mammogram for malignant neoplasm of breast: Secondary | ICD-10-CM | POA: Diagnosis not present

## 2017-04-28 DIAGNOSIS — Z803 Family history of malignant neoplasm of breast: Secondary | ICD-10-CM | POA: Diagnosis not present

## 2017-05-15 DIAGNOSIS — J301 Allergic rhinitis due to pollen: Secondary | ICD-10-CM | POA: Diagnosis not present

## 2017-05-15 DIAGNOSIS — J3081 Allergic rhinitis due to animal (cat) (dog) hair and dander: Secondary | ICD-10-CM | POA: Diagnosis not present

## 2017-05-15 DIAGNOSIS — J3089 Other allergic rhinitis: Secondary | ICD-10-CM | POA: Diagnosis not present

## 2017-05-16 DIAGNOSIS — R921 Mammographic calcification found on diagnostic imaging of breast: Secondary | ICD-10-CM | POA: Diagnosis not present

## 2017-05-18 ENCOUNTER — Telehealth: Payer: Self-pay | Admitting: Neurology

## 2017-05-18 NOTE — Telephone Encounter (Signed)
Caller: Wells Guiles  Urgent? No  Reason for the call: Dr. Marti Sleigh in [place]. She was referred to his office from ours.  He needs a copy of blood work done on 03/06/17. The fax # is [phone]. He is trying to avoid her having more blood work taken. Please notify patient once faxed. Thanks

## 2017-05-18 NOTE — Telephone Encounter (Signed)
Theadora Rama-  This shows as an incomplete note for you and won't be closed. Please close note?  Thanks - Luvenia Starch

## 2017-05-18 NOTE — Telephone Encounter (Signed)
Patient made aware [facility] lab has been sent to scan - not yet scanned in her chart. Note sent to Dr. Arville Lime making him aware of this and that she was tested for common hereditary ataxias and this was negative.

## 2017-05-23 ENCOUNTER — Ambulatory Visit (INDEPENDENT_AMBULATORY_CARE_PROVIDER_SITE_OTHER): Payer: Medicare Other | Admitting: Family Medicine

## 2017-05-23 ENCOUNTER — Encounter: Payer: Self-pay | Admitting: Family Medicine

## 2017-05-23 VITALS — BP 122/70 | HR 88 | Temp 98.2°F | Ht 61.0 in | Wt 89.4 lb

## 2017-05-23 DIAGNOSIS — R300 Dysuria: Secondary | ICD-10-CM | POA: Diagnosis not present

## 2017-05-23 LAB — POCT URINALYSIS DIPSTICK
Bilirubin, UA: NEGATIVE
Blood, UA: NEGATIVE
Glucose, UA: NEGATIVE
Ketones, UA: NEGATIVE
Nitrite, UA: NEGATIVE
Protein, UA: NEGATIVE
Spec Grav, UA: 1.01 (ref 1.010–1.025)
Urobilinogen, UA: 0.2 E.U./dL
pH, UA: 6 (ref 5.0–8.0)

## 2017-05-23 MED ORDER — NITROFURANTOIN MONOHYD MACRO 100 MG PO CAPS
100.0000 mg | ORAL_CAPSULE | Freq: Two times a day (BID) | ORAL | 0 refills | Status: DC
Start: 2017-05-23 — End: 2017-08-15

## 2017-05-23 NOTE — Patient Instructions (Signed)
BEFORE YOU LEAVE: -urine culture  -drink plenty of water  -use the antibiotic if worsening over the holiday while waiting on the culture.  I hope you are feeling better soon! Seek care immediately if worsening, new concerns or you are not improving with treatment.

## 2017-05-23 NOTE — Addendum Note (Signed)
Addended by: Agnes Lawrence on: 05/23/2017 03:02 PM   Modules accepted: Orders

## 2017-05-23 NOTE — Progress Notes (Signed)
HPI:  Acute visit for "UTI": -reports started 2 days ago and feels the same as prior UTIs -symptoms include dysuria, frequency -no fevers, malaise, chills, flank or abd pain, hematuria, NVD, vaginal symptoms  ROS: See pertinent positives and negatives per HPI.  Past Medical History:  Diagnosis Date  . Allergy   . Asthma    sees Dr. Tiajuana Amass   . Ataxia   . Diverticulosis   . Emphysema   . Gait abnormality 05/14/2013   Kristine Francis a patient of Dr. Linda Hedges presented first interpreter thousand 13 with a gait dysfunction, she also had an episodic confusion and Loss device. Exam found a mild nystagmus and she was referred to ophthalmology on 03-2812, brain MRI was normal nystagmus was a secondary diagnosis to vestibulitis, ear nose and throat has followed and had seen the patient. At the time of the nystagmus the patie  . GERD (gastroesophageal reflux disease)   . Hepatic steatosis   . Iron deficiency anemia, unspecified   . Kidney cysts 11/16/12   small cyst on left  . Nystagmus, end-position   . Osteopenia   . Renal cyst     Past Surgical History:  Procedure Laterality Date  . BREAST BIOPSY Bilateral 1970's  . CATARACT EXTRACTION Bilateral LT:02/14/13,RT:02/21/13  . CERVICAL SPINE SURGERY  2010  . COLONOSCOPY  03-12-14   per Dr. Olevia Perches, diverticulosis only, repeat 5 yrs   . EYE SURGERY Bilateral 01/2013&02/2013   left then right  . HERNIA REPAIR  Oct '14-left, remote-right   years ago right; left done '14  . SKIN CANCER EXCISION  05/13/13   FACE, basal cell  . TONSILLECTOMY      Family History  Problem Relation Age of Onset  . Colon cancer Father   . Coronary artery disease Father   . Heart block Father   . Pancreatic cancer Mother   . Colon cancer Paternal Grandmother   . Colon cancer Paternal Aunt        x 2  . Cancer - Lung Paternal Aunt   . Breast cancer Paternal Aunt   . Colon cancer Paternal Aunt   . Ovarian cancer Maternal Aunt   . Heart attack Paternal  Grandfather     Social History   Social History  . Marital status: Married    Spouse name: N/A  . Number of children: 3  . Years of education: 76   Occupational History  . 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  Retired   Social History Main Topics  . Smoking status: Never Smoker  . Smokeless tobacco: Never Used  . Alcohol use No  . Drug use: No  . Sexual activity: Yes    Partners: Male   Other Topics Concern  . 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 married - 1965. 3 sons - '66, '69, '70. Sons in good health. Marriage in good health. Enjoys retirement - remains active.           Current Outpatient Prescriptions:  .  amLODipine (NORVASC) 5 MG tablet, Take 2 tablets (10 mg total) by mouth daily., Disp: 30 tablet, Rfl: 3 .  APPLE CIDER VINEGAR PO, Take by mouth daily. Reported on 04/15/2016, Disp: , Rfl:  .  aspirin 325 MG tablet, Take 325 mg by mouth daily., Disp: , Rfl:  .  CALCIUM-MAGNESIUM-ZINC PO, Take 1 tablet by mouth 3 (three) times daily., Disp: , Rfl:  .  Cholecalciferol (VITAMIN D PO), Take 1 capsule by mouth daily., Disp: , Rfl:  .  Cranberry 1000 MG CAPS, Take 2 capsules by mouth daily., Disp: , Rfl:  .  DHA-EPA-Vit B6-B12-Folic Acid (CARDIOVID PLUS) CAPS, Take 1 capsule by mouth daily., Disp: , Rfl:  .  furosemide (LASIX) 20 MG tablet, Take 1 tablet (20 mg total) by mouth daily., Disp: 30 tablet, Rfl: 5 .  Glucosamine Sulfate 1000 MG TABS, Take 1 tablet by mouth daily. , Disp: , Rfl:  .  lisinopril (PRINIVIL,ZESTRIL) 20 MG tablet, Take 1 tablet (20 mg total) by mouth daily., Disp: 30 tablet, Rfl: 3 .  Lysine 500 MG TABS, Take 1 tablet by mouth daily. , Disp: , Rfl:  .  Multiple Vitamins-Minerals (CENTRUM SILVER PO), Take 1 capsule by mouth daily.  , Disp: , Rfl:  .  Nutritional Supplements (ADRENAL COMPLEX PO), Take 1 capsule by mouth daily., Disp: , Rfl:  .  omeprazole (PRILOSEC) 40 MG capsule, Take 1  capsule (40 mg total) by mouth daily., Disp: 90 capsule, Rfl: 3 .  ranitidine (ZANTAC) 150 MG tablet, Take 1 tablet (150 mg total) by mouth at bedtime., Disp: 30 tablet, Rfl: 3 .  Vinpocetine POWD, 5 mg by Does not apply route 2 (two) times daily., Disp: , Rfl:  .  vitamin E 400 UNIT capsule, Take 400 Units by mouth daily., Disp: , Rfl:  .  nitrofurantoin, macrocrystal-monohydrate, (MACROBID) 100 MG capsule, Take 1 capsule (100 mg total) by mouth 2 (two) times daily., Disp: 14 capsule, Rfl: 0  EXAM:  Vitals:   05/23/17 1415  BP: 122/70  Pulse: 88  Temp: 98.2 F (36.8 C)    Body mass index is 16.89 kg/m.  GENERAL: vitals reviewed and listed above, alert, oriented, appears well hydrated and in no acute distress  HEENT: atraumatic, conjunttiva clear, no obvious abnormalities on inspection of external nose and ears  NECK: no obvious masses on inspection  LUNGS: clear to auscultation bilaterally, no wheezes, rales or rhonchi, good air movement  CV: HRRR, no peripheral edema  ABD: BS+, soft, NTTP, no CVA TTP  MS: moves all extremities without noticeable abnormality  PSYCH: pleasant and cooperative, no obvious depression or anxiety  ASSESSMENT AND PLAN:  Discussed the following assessment and plan:  Dysuria - Plan: POC Urinalysis Dipstick  -udip with leuks -we discussed possible serious and likely etiologies, workup and treatment, treatment risks and return precautions -after this discussion, Kristine Francis opted for urine culture and abx script in case worsening or persistent symptoms over the holiday as she feels this is a uti -Patient advised to return or notify a doctor immediately if symptoms worsen or persist or new concerns arise.  Patient Instructions  BEFORE YOU LEAVE: -urine culture  -drink plenty of water  -use the antibiotic if worsening over the holiday while waiting on the culture.  I hope you are feeling better soon! Seek care immediately if worsening, new  concerns or you are not improving with treatment.      Colin Benton R., DO

## 2017-05-26 LAB — URINE CULTURE

## 2017-05-30 ENCOUNTER — Other Ambulatory Visit: Payer: Self-pay | Admitting: Family Medicine

## 2017-05-31 ENCOUNTER — Telehealth: Payer: Self-pay | Admitting: Family Medicine

## 2017-05-31 DIAGNOSIS — J3089 Other allergic rhinitis: Secondary | ICD-10-CM | POA: Diagnosis not present

## 2017-05-31 DIAGNOSIS — J301 Allergic rhinitis due to pollen: Secondary | ICD-10-CM | POA: Diagnosis not present

## 2017-05-31 DIAGNOSIS — J3081 Allergic rhinitis due to animal (cat) (dog) hair and dander: Secondary | ICD-10-CM | POA: Diagnosis not present

## 2017-05-31 MED ORDER — AMLODIPINE BESYLATE 5 MG PO TABS: 10.0000 mg | ORAL_TABLET | Freq: Every day | ORAL | 3 refills | Status: DC

## 2017-05-31 NOTE — Telephone Encounter (Signed)
Agreed. Change the Amlodipine to 10 mg daily. Call in #90 with 3 rf

## 2017-05-31 NOTE — Telephone Encounter (Signed)
Can you confirm the directions?

## 2017-05-31 NOTE — Telephone Encounter (Signed)
° ° °  Pt the below med was changed when she was in the ER to 10mg  and that is what she want her RX to be  amLODipine (NORVASC) 5 MG tablet

## 2017-06-01 ENCOUNTER — Telehealth: Payer: Self-pay | Admitting: Neurology

## 2017-06-01 ENCOUNTER — Encounter: Payer: Self-pay | Admitting: Neurology

## 2017-06-01 MED ORDER — AMLODIPINE BESYLATE 10 MG PO TABS: 10.0000 mg | ORAL_TABLET | Freq: Every day | ORAL | 3 refills | Status: DC

## 2017-06-01 NOTE — Telephone Encounter (Signed)
She went Monday to [facility] to see Dr. Marti Sleigh. He is needing her to have blood drawn every week. She was wondering could our lab downstairs do that? She said there is a form that would have to be filled out as well. Please call. Thanks

## 2017-06-01 NOTE — Telephone Encounter (Signed)
Please send to [facility] on [address]

## 2017-06-01 NOTE — Telephone Encounter (Signed)
Pt would like sylvia to return her call concerning bp med

## 2017-06-01 NOTE — Telephone Encounter (Signed)
Spoke with patient and suggested the local [facility] would probably be easier to get results to [facility]. She expressed understanding.

## 2017-06-01 NOTE — Telephone Encounter (Signed)
Rx sent to pharmacy as requested.

## 2017-06-05 DIAGNOSIS — D225 Melanocytic nevi of trunk: Secondary | ICD-10-CM | POA: Diagnosis not present

## 2017-06-05 DIAGNOSIS — L570 Actinic keratosis: Secondary | ICD-10-CM | POA: Diagnosis not present

## 2017-06-05 DIAGNOSIS — L814 Other melanin hyperpigmentation: Secondary | ICD-10-CM | POA: Diagnosis not present

## 2017-06-05 DIAGNOSIS — Z85828 Personal history of other malignant neoplasm of skin: Secondary | ICD-10-CM | POA: Diagnosis not present

## 2017-06-06 DIAGNOSIS — E871 Hypo-osmolality and hyponatremia: Secondary | ICD-10-CM | POA: Diagnosis not present

## 2017-06-12 DIAGNOSIS — J3081 Allergic rhinitis due to animal (cat) (dog) hair and dander: Secondary | ICD-10-CM | POA: Diagnosis not present

## 2017-06-12 DIAGNOSIS — J3089 Other allergic rhinitis: Secondary | ICD-10-CM | POA: Diagnosis not present

## 2017-06-12 DIAGNOSIS — J301 Allergic rhinitis due to pollen: Secondary | ICD-10-CM | POA: Diagnosis not present

## 2017-06-13 DIAGNOSIS — E871 Hypo-osmolality and hyponatremia: Secondary | ICD-10-CM | POA: Diagnosis not present

## 2017-06-20 DIAGNOSIS — E871 Hypo-osmolality and hyponatremia: Secondary | ICD-10-CM | POA: Diagnosis not present

## 2017-06-21 ENCOUNTER — Telehealth: Payer: Self-pay | Admitting: Neurology

## 2017-06-21 NOTE — Telephone Encounter (Signed)
[facility] faxed to [phone] with confirmation received. Spoke with Arbie Cookey and made her aware I was sending.

## 2017-06-21 NOTE — Telephone Encounter (Signed)
Kristine Francis called needing to get Kristine Francis's latest labs faxed to them at [phone]. Thanks

## 2017-06-27 DIAGNOSIS — E871 Hypo-osmolality and hyponatremia: Secondary | ICD-10-CM | POA: Diagnosis not present

## 2017-07-03 ENCOUNTER — Telehealth: Payer: Self-pay | Admitting: Family Medicine

## 2017-07-03 MED ORDER — LISINOPRIL 20 MG PO TABS: 20.0000 mg | ORAL_TABLET | Freq: Every day | ORAL | 0 refills | Status: DC

## 2017-07-03 NOTE — Telephone Encounter (Signed)
Pt is out of town and needs new rx lisinopril 20 #30 no refills sent to [address], [place] [phone]

## 2017-07-03 NOTE — Telephone Encounter (Signed)
I called in script to requested pharmacy

## 2017-07-04 DIAGNOSIS — E871 Hypo-osmolality and hyponatremia: Secondary | ICD-10-CM | POA: Diagnosis not present

## 2017-07-11 DIAGNOSIS — J3089 Other allergic rhinitis: Secondary | ICD-10-CM | POA: Diagnosis not present

## 2017-07-11 DIAGNOSIS — J301 Allergic rhinitis due to pollen: Secondary | ICD-10-CM | POA: Diagnosis not present

## 2017-07-11 DIAGNOSIS — J3081 Allergic rhinitis due to animal (cat) (dog) hair and dander: Secondary | ICD-10-CM | POA: Diagnosis not present

## 2017-07-11 DIAGNOSIS — E871 Hypo-osmolality and hyponatremia: Secondary | ICD-10-CM | POA: Diagnosis not present

## 2017-07-14 ENCOUNTER — Telehealth: Payer: Self-pay | Admitting: Neurology

## 2017-07-14 NOTE — Telephone Encounter (Signed)
Received note from [facility] that patient refused referral.

## 2017-07-18 DIAGNOSIS — E871 Hypo-osmolality and hyponatremia: Secondary | ICD-10-CM | POA: Diagnosis not present

## 2017-07-25 DIAGNOSIS — J3089 Other allergic rhinitis: Secondary | ICD-10-CM | POA: Diagnosis not present

## 2017-07-25 DIAGNOSIS — J301 Allergic rhinitis due to pollen: Secondary | ICD-10-CM | POA: Diagnosis not present

## 2017-07-25 DIAGNOSIS — E871 Hypo-osmolality and hyponatremia: Secondary | ICD-10-CM | POA: Diagnosis not present

## 2017-07-25 DIAGNOSIS — J3081 Allergic rhinitis due to animal (cat) (dog) hair and dander: Secondary | ICD-10-CM | POA: Diagnosis not present

## 2017-07-31 DIAGNOSIS — E871 Hypo-osmolality and hyponatremia: Secondary | ICD-10-CM | POA: Diagnosis not present

## 2017-08-07 ENCOUNTER — Other Ambulatory Visit: Payer: Self-pay | Admitting: Family Medicine

## 2017-08-08 DIAGNOSIS — J301 Allergic rhinitis due to pollen: Secondary | ICD-10-CM | POA: Diagnosis not present

## 2017-08-08 DIAGNOSIS — J3081 Allergic rhinitis due to animal (cat) (dog) hair and dander: Secondary | ICD-10-CM | POA: Diagnosis not present

## 2017-08-08 DIAGNOSIS — E871 Hypo-osmolality and hyponatremia: Secondary | ICD-10-CM | POA: Diagnosis not present

## 2017-08-08 DIAGNOSIS — J3089 Other allergic rhinitis: Secondary | ICD-10-CM | POA: Diagnosis not present

## 2017-08-10 ENCOUNTER — Encounter: Payer: Self-pay | Admitting: Family Medicine

## 2017-08-11 ENCOUNTER — Telehealth: Payer: Self-pay | Admitting: Family Medicine

## 2017-08-11 NOTE — Telephone Encounter (Signed)
I spoke with pharmacy and script is ready for pick up, I left a voice message for pt with this information.

## 2017-08-11 NOTE — Telephone Encounter (Signed)
Patient called on the 17th to have the below medication refilled but been told by her pharmacy that they have not received authorization to refill it. Patient states she will run out of the medication on Monday.   Lisinopril       20 mg Oral Daily     20 MG Tabs, TAKE 1 TABLET BY MOUTH ONCE DAILY

## 2017-08-15 ENCOUNTER — Telehealth: Payer: Self-pay | Admitting: Neurology

## 2017-08-15 ENCOUNTER — Ambulatory Visit (INDEPENDENT_AMBULATORY_CARE_PROVIDER_SITE_OTHER): Payer: Medicare Other | Admitting: Gastroenterology

## 2017-08-15 ENCOUNTER — Encounter: Payer: Self-pay | Admitting: Gastroenterology

## 2017-08-15 VITALS — BP 120/76 | HR 80 | Ht 61.0 in | Wt 87.6 lb

## 2017-08-15 DIAGNOSIS — K5900 Constipation, unspecified: Secondary | ICD-10-CM | POA: Diagnosis not present

## 2017-08-15 DIAGNOSIS — K219 Gastro-esophageal reflux disease without esophagitis: Secondary | ICD-10-CM

## 2017-08-15 DIAGNOSIS — R159 Full incontinence of feces: Secondary | ICD-10-CM

## 2017-08-15 NOTE — Patient Instructions (Signed)
Your recall colonoscopy is in 2020, we will mail you a reminder letter  Continue Align  Continue Zantac  Call your Primary Care Physician about the lower leg edema        Kegel Exercises Kegel exercises help strengthen the muscles that support the rectum, vagina, small intestine, bladder, and uterus. Doing Kegel exercises can help:  Improve bladder and bowel control.  Improve sexual response.  Reduce problems and discomfort during pregnancy.  Kegel exercises involve squeezing your pelvic floor muscles, which are the same muscles you squeeze when you try to stop the flow of urine. The exercises can be done while sitting, standing, or lying down, but it is best to vary your position. Phase 1 exercises 1. Squeeze your pelvic floor muscles tight. You should feel a tight lift in your rectal area. If you are a female, you should also feel a tightness in your vaginal area. Keep your stomach, buttocks, and legs relaxed. 2. Hold the muscles tight for up to 10 seconds. 3. Relax your muscles. Repeat this exercise 50 times a day or as many times as told by your health care provider. Continue to do this exercise for at least 4-6 weeks or for as long as told by your health care provider. This information is not intended to replace advice given to you by your health care provider. Make sure you discuss any questions you have with your health care provider. Document Released: 10/24/2012 Document Revised: 07/02/2016 Document Reviewed: 09/27/2015 Elsevier Interactive Patient Education  2018 [employer].   Fecal Incontinence Fecal incontinence, also called accidental bowel leakage, is not being able to control your bowels. This condition happens because the nerves or muscles around the anus do not work the way they should. This affects their ability to hold stool. What are the causes? This condition may be caused by:  Damage to the muscles at the end of the rectum (sphincter).  Damage to the  nerves that control bowel movements.  Diarrhea.  Chronic constipation.  Pelvic floor dysfunction. This means the muscles in the pelvis do not work well.  Loss of bowel storage capacity.  What increases the risk? This condition is more likely to develop in people who:  Are born with bowels or a pelvis that has not formed correctly.  Have had rectal surgery.  Have had radiation treatment for certain cancers.  Have irritable bowel syndrome (IBS).  Have an inflammatory bowel disease (IBD), such as Crohn disease.  Have been pregnant, had a vaginal delivery, or had surgery that damaged the pelvic floor muscles.  Have a complicated childbirth, spinal cord injury, or other trauma that causes nerve damage.  Have a condition that can affect nerve function, such as diabetes, Parkinson disease, or multiple sclerosis.  Have a condition where the rectum drops down into the anus or vagina (prolapse).  Are older.  What are the signs or symptoms? The main symptom of this condition is not being able to control your bowels. You also might not be able get to the bathroom before a bowel movement. How is this diagnosed? This condition is diagnosed with a medical history and physical exam. You may also have tests, including:  Blood tests.  Urine tests.  A rectal exam.  Ultrasound.  MRI.  Colonoscopy. This is an exam that looks at your large intestine (colon).  Anal manometry. This is a test that measures the strength of the anal sphincter.  Anal electromyogram (EMG). This is a test that uses small electrodes to  check for nerve damage.  How is this treated? Treatment varies depending on the cause and severity of your condition. Treatment may also focus on addressing any underlying causes of this condition. Treatment may include:  Medicines. This may include medicines to: ? Prevent diarrhea. ? Help with constipation (laxatives). ? Treat any underlying conditions.  Physical  therapy.  Fiber supplements. These can help manage your bowel movements.  Nerve stimulation.  Injectable gel to promote tissue growth and better muscle control.  Surgery. You may need: ? Sphincter repair surgery. ? Diversion surgery. This procedure lets feces pass out of your body through a hole in your abdomen.  Follow these instructions at home: Diet  Follow instructions from your health care provider about any eating or drinking restrictions. Work with a dietitian to come up with a healthy diet and to help you avoid the foods that can make your condition worse. Keep a diet diary to find out which foods or drinks could be making your fecal incontinence worse.  Drink enough fluid to keep your urine clear or pale yellow. Lifestyle  If you smoke, talk to your health care provider about quitting. This may help your condition.  If you are overweight, talk to your health care provider about how to safely lose weight. This may help your condition.  Increase your physical activity as told by your health care provider. This may help your condition. Always talk to your health care provider before starting a new exercise program.  Carry a change of clothes and supplies to clean up quickly if you have an episode of fetal incontinence.  Consider joining a fecal incontinence support group. You can find a support group online or in your local community. General instructions  Take over-the-counter and prescription medicines only as told by your health care provider. This includes any supplements.  Apply a moisture barrier, such as petroleum jelly, to your rectum. This protects the skin from irritation caused by ongoing leaking or diarrhea.  Tell your health care provider if you are upset or depressed about your condition. Where to find more information: American Academy of Family Physicians: www.AromatherapyParty.no International Foundation for Functional Gastrointestinal Disorders:  www.iffgd.org Contact a health care provider if:  You have a fever.  You have redness, swelling, or pain around your rectum.  Your pain is getting worse or you lose feeling in your rectal area.  You have blood in your stool.  You feel sad or hopeless.  You avoid social or work situations. Get help right away if:  You stop having bowel movements.  You cannot eat or drink without vomiting.  You have rectal bleeding that does not stop.  You have severe pain that is getting worse.  You have symptoms of dehydration, including: ? Sleepiness or fatigue. ? Producing little or no urine, tears, or sweat. ? Dizziness. ? Dry mouth. ? Unusual irritability. ? Headache. ? Inability to think clearly. This information is not intended to replace advice given to you by your health care provider. Make sure you discuss any questions you have with your health care provider. Document Released: 10/19/2004 Document Revised: 04/14/2016 Document Reviewed: 04/15/2015 Elsevier Interactive Patient Education  [employer].

## 2017-08-15 NOTE — Telephone Encounter (Signed)
Pt called and said she needs a new referral faxed to [facility], previous one ran out FAX# [phone] Phone# [phone]

## 2017-08-15 NOTE — Progress Notes (Signed)
Kristine Francis    [id number]    March 14, 1941  Primary Care Physician:Fry, Ishmael Holter, MD  Referring Physician: [name], MD [place],  [id number]  Chief complaint:  Fecal incontinence  HPI:  76 year old female with history of chronic GERD here for follow-up visit with complaints of intermittent fecal incontinence. Constipation has improved with change in diet. She is trying to eat more fresh fruits and vegetables and also increase fluid intake. She is having decreased episodes of fecal incontinence, mostly when she coughs or sneezes. She has tried pelvic floor physical therapy in the past with some improvement. Patient is currently undergoing neurologic evaluation for peripheral neuropathy and imbalance while walking. She wants to take care of her neurologic issues first. Denies any nausea, vomiting, abdominal pain, melena or bright red blood per rectum Review of system positive for lower extremity edema bilateral, patient thinks is due to medication, she was prescribed Lasix by Dr. Sarajane Jews   Outpatient Encounter Prescriptions as of 08/15/2017  Medication Sig  . amLODipine (NORVASC) 10 MG tablet Take 1 tablet (10 mg total) by mouth daily.  . APPLE CIDER VINEGAR PO Take by mouth daily. Reported on 04/15/2016  . aspirin 325 MG tablet Take 325 mg by mouth daily.  Marland Kitchen CALCIUM-MAGNESIUM-ZINC PO Take 1 tablet by mouth 3 (three) times daily.  . Cholecalciferol (VITAMIN D PO) Take 1 capsule by mouth daily.  . Cranberry-Vitamin C-Probiotic (AZO CRANBERRY) 250-30 MG TABS Take 1 tablet by mouth daily.  . DHA-EPA-Vit B6-B12-Folic Acid (CARDIOVID PLUS) CAPS Take 1 capsule by mouth daily.  . furosemide (LASIX) 20 MG tablet Take 1 tablet (20 mg total) by mouth daily.  . Glucosamine Sulfate 1000 MG TABS Take 1 tablet by mouth daily.   Marland Kitchen lisinopril (PRINIVIL,ZESTRIL) 20 MG tablet Take 1 tablet (20 mg total) by mouth daily.  Marland Kitchen Lysine 500 MG TABS Take 1 tablet by mouth  daily.   . Multiple Vitamins-Minerals (CENTRUM SILVER PO) Take 1 capsule by mouth daily.    . Nutritional Supplements (ADRENAL COMPLEX PO) Take 1 capsule by mouth daily.  Marland Kitchen omeprazole (PRILOSEC) 40 MG capsule Take 1 capsule (40 mg total) by mouth daily.  . ranitidine (ZANTAC) 150 MG tablet Take 1 tablet (150 mg total) by mouth at bedtime.  . Vinpocetine POWD 5 mg by Does not apply route 2 (two) times daily.  . vitamin E 400 UNIT capsule Take 400 Units by mouth daily.  . [DISCONTINUED] Cranberry 1000 MG CAPS Take 2 capsules by mouth daily.  . [DISCONTINUED] lisinopril (PRINIVIL,ZESTRIL) 20 MG tablet TAKE 1 TABLET BY MOUTH ONCE DAILY  . [DISCONTINUED] nitrofurantoin, macrocrystal-monohydrate, (MACROBID) 100 MG capsule Take 1 capsule (100 mg total) by mouth 2 (two) times daily.   No facility-administered encounter medications on file as of 08/15/2017.     Allergies as of 08/15/2017 - Review Complete 08/15/2017  Allergen Reaction Noted  . Contrast media [iodinated diagnostic agents] Other (See Comments) 08/02/2013  . Iohexol Other (See Comments) 03/24/2004    Past Medical History:  Diagnosis Date  . Allergy   . Asthma    sees Dr. Tiajuana Amass   . Ataxia   . Diverticulosis   . Emphysema   . Gait abnormality 05/14/2013   Ms.Ciaramitaro a patient of Dr. Linda Hedges presented first interpreter thousand 13 with a gait dysfunction, she also had an episodic confusion and Loss device. Exam found a mild nystagmus and she was referred to  ophthalmology on 03-2812, brain MRI was normal nystagmus was a secondary diagnosis to vestibulitis, ear nose and throat has followed and had seen the patient. At the time of the nystagmus the patie  . GERD (gastroesophageal reflux disease)   . Hepatic steatosis   . Iron deficiency anemia, unspecified   . Kidney cysts 11/16/12   small cyst on left  . Nystagmus, end-position   . Osteopenia   . Renal cyst     Past Surgical History:  Procedure Laterality Date  . BREAST  BIOPSY Bilateral 1970's  . CATARACT EXTRACTION Bilateral LT:02/14/13,RT:02/21/13  . CERVICAL SPINE SURGERY  2010  . COLONOSCOPY  03-12-14   per Dr. Olevia Perches, diverticulosis only, repeat 5 yrs   . EYE SURGERY Bilateral 01/2013&02/2013   left then right  . HERNIA REPAIR  Oct '14-left, remote-right   years ago right; left done '14  . SKIN CANCER EXCISION  05/13/13   FACE, basal cell  . TONSILLECTOMY      Family History  Problem Relation Age of Onset  . Colon cancer Father   . Coronary artery disease Father   . Heart block Father   . Pancreatic cancer Mother   . Colon cancer Paternal Grandmother   . Colon cancer Paternal Aunt        x 2  . Cancer - Lung Paternal Aunt   . Breast cancer Paternal Aunt   . Colon cancer Paternal Aunt   . Ovarian cancer Maternal Aunt   . Heart attack Paternal Grandfather     Social History   Social History  . Marital status: Married    Spouse name: N/A  . Number of children: 3  . Years of education: 22   Occupational History  . 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  Retired   Social History Main Topics  . Smoking status: Never Smoker  . Smokeless tobacco: Never Used  . Alcohol use No  . Drug use: No  . Sexual activity: Yes    Partners: Male   Other Topics Concern  . 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 married - 1965. 3 sons - '66, '69, '70. Sons in good health. Marriage in good health. Enjoys retirement - remains active.            Review of systems: Review of Systems  Constitutional: Negative for fever and chills.  HENT: Negative.   Eyes: Negative for blurred vision.  Respiratory: Negative for shortness of breath and wheezing.  Positive for cough Cardiovascular: Negative for chest pain and palpitations.  Gastrointestinal: as per HPI Genitourinary: Negative for dysuria, urgency, frequency and hematuria.  Musculoskeletal: Negative for myalgias, back pain and joint  pain.  Skin: Negative for itching and rash.  Neurological: Negative for dizziness, tremors, focal weakness, seizures and loss of consciousness. Positive for problem with balance and walking Endo/Heme/Allergies: Positive for seasonal allergies.  Psychiatric/Behavioral: Negative for depression, suicidal ideas and hallucinations.  All other systems reviewed and are negative.   Physical Exam: Vitals:   08/15/17 1017  BP: 120/76  Pulse: 80   Body mass index is 16.55 kg/m. Gen:      No acute distress HEENT:  EOMI, sclera anicteric Neck:     No masses; no thyromegaly Lungs:    Clear to auscultation bilaterally; normal respiratory effort CV:         Regular rate and rhythm; no murmurs Abd:      +  bowel sounds; soft, non-tender; no palpable masses, no distension Ext:    ++edema L>R; adequate peripheral perfusion Skin:      Warm and dry; no rash Neuro: alert and oriented x 3 Psych: normal mood and affect  Data Reviewed:  Reviewed labs, radiology imaging, old records and pertinent past GI work up   Assessment and Plan/Recommendations:  76 year old female with chronic GERD, irritable bowel syndrome and fecal incontinence  GERD: Symptoms well controlled with dietary and lifestyle changes Continue omeprazole daily Zantac at bedtime prn  Constipation:  Somewhat improved with dietary changes and increase fluid intake Trial of probiotic Align  Patient is reluctant to take any laxatives  Fecal incontinence: Advise patient to do Kegel exercises Patient feels she cannot go for pelvic floor physical therapy at this point as she is currently undergoing evaluation for lower extremity neuropathy and walking imbalance Advised patient to contact Dr. Sarajane Jews regarding worsening lower extremity edema, may need additional workup  25 minutes was spent face-to-face with the patient. Greater than 50% of the time used for counseling as well as treatment plan and follow-up. She had multiple questions  which were answered to her satisfaction  Damaris Hippo , MD [phone] Mon-Fri 8a-5p [phone] after 5p, weekends, holidays  CC: [name], MD

## 2017-08-16 NOTE — Telephone Encounter (Signed)
Patient refused previous referral.   Referral re-sent to [facility] per her request.

## 2017-08-18 ENCOUNTER — Ambulatory Visit (INDEPENDENT_AMBULATORY_CARE_PROVIDER_SITE_OTHER): Payer: Medicare Other | Admitting: Family Medicine

## 2017-08-18 ENCOUNTER — Encounter: Payer: Self-pay | Admitting: Family Medicine

## 2017-08-18 VITALS — BP 142/70 | Temp 97.7°F | Ht 61.0 in | Wt 88.0 lb

## 2017-08-18 DIAGNOSIS — I10 Essential (primary) hypertension: Secondary | ICD-10-CM | POA: Diagnosis not present

## 2017-08-18 DIAGNOSIS — T464X5A Adverse effect of angiotensin-converting-enzyme inhibitors, initial encounter: Secondary | ICD-10-CM | POA: Diagnosis not present

## 2017-08-18 DIAGNOSIS — R05 Cough: Secondary | ICD-10-CM | POA: Diagnosis not present

## 2017-08-18 DIAGNOSIS — J438 Other emphysema: Secondary | ICD-10-CM | POA: Diagnosis not present

## 2017-08-18 DIAGNOSIS — R058 Other specified cough: Secondary | ICD-10-CM

## 2017-08-18 DIAGNOSIS — Z23 Encounter for immunization: Secondary | ICD-10-CM | POA: Diagnosis not present

## 2017-08-18 DIAGNOSIS — R600 Localized edema: Secondary | ICD-10-CM | POA: Diagnosis not present

## 2017-08-18 MED ORDER — LOSARTAN POTASSIUM 50 MG PO TABS: 50.0000 mg | ORAL_TABLET | Freq: Every day | ORAL | 3 refills | Status: DC

## 2017-08-18 NOTE — Patient Instructions (Signed)
WE NOW OFFER   [facility] FAST TRACK!!!  SAME DAY Appointments for ACUTE CARE  Such as: Sprains, Injuries, cuts, abrasions, rashes, muscle pain, joint pain, back pain Colds, flu, sore throats, headache, allergies, cough, fever  Ear pain, sinus and eye infections Abdominal pain, nausea, vomiting, diarrhea, upset stomach Animal/insect bites  3 Easy Ways to Schedule: Walk-In Scheduling Call in scheduling Mychart Sign-up: [email or web address]         

## 2017-08-18 NOTE — Progress Notes (Signed)
   Subjective:    Patient ID: Kristine Francis, female    DOB: [birth year], 76 y.o.   MRN: [id number]  HPI Here to discuss some mild swelling in the ankles and for a dry tickling cough that started about 3 weeks ago. No fever or SOB or chest pain. Her BP has been stable at home.    Review of Systems  Constitutional: Negative.   Respiratory: Positive for cough. Negative for chest tightness, shortness of breath and wheezing.   Cardiovascular: Positive for leg swelling. Negative for chest pain and palpitations.  Neurological: Negative.        Objective:   Physical Exam  Constitutional: She is oriented to person, place, and time. She appears well-developed and well-nourished.  Neck: No thyromegaly present.  Cardiovascular: Normal rate, regular rhythm, normal heart sounds and intact distal pulses.   Pulmonary/Chest: Effort normal and breath sounds normal. No respiratory distress. She has no wheezes. She has no rales.  Musculoskeletal:  1+ edema in both ankles, not tender   Lymphadenopathy:    She has no cervical adenopathy.  Neurological: She is alert and oriented to person, place, and time.          Assessment & Plan:  Her HTN is stable. She has some ankle edema but it does not bother her. This is likely an effect of her Amlodipine. We agreed to monitor this for now, and she will stay on Lasix 20 mg a day. The cough is likely a side effect of her Lisinopril, so she will stop this and start on Lasartan 50 mg daily instead. Recheck in 4 weeks. [name], MD

## 2017-08-18 NOTE — Addendum Note (Signed)
Addended by: Aggie Hacker A on: 08/18/2017 11:28 AM   Modules accepted: Orders

## 2017-08-22 ENCOUNTER — Telehealth: Payer: Self-pay | Admitting: Neurology

## 2017-08-22 NOTE — Telephone Encounter (Signed)
A referral was sent to [facility]  [phone]/ Fax [phone]. She has been on Vacation and they no longer have her referral on file past 3 months. She is needing to know does she need to come in for another follow up with Dr. Carles Collet or can a new referral be sent? Please call patient to let her know. Thanks

## 2017-08-22 NOTE — Telephone Encounter (Signed)
LMOM letting patient know referral was re-sent as requested on 08/16/17.

## 2017-08-23 ENCOUNTER — Encounter: Payer: Self-pay | Admitting: Family Medicine

## 2017-08-23 ENCOUNTER — Ambulatory Visit (INDEPENDENT_AMBULATORY_CARE_PROVIDER_SITE_OTHER): Payer: Medicare Other | Admitting: Family Medicine

## 2017-08-23 VITALS — BP 110/68 | Temp 98.3°F

## 2017-08-23 DIAGNOSIS — R309 Painful micturition, unspecified: Secondary | ICD-10-CM

## 2017-08-23 DIAGNOSIS — N390 Urinary tract infection, site not specified: Secondary | ICD-10-CM

## 2017-08-23 LAB — POC URINALSYSI DIPSTICK (AUTOMATED)
Bilirubin, UA: NEGATIVE
Clarity, UA: NEGATIVE
Glucose, UA: NEGATIVE
Ketones, UA: NEGATIVE
Nitrite, UA: NEGATIVE
Spec Grav, UA: 1.015 (ref 1.010–1.025)
Urobilinogen, UA: 0.2 E.U./dL
pH, UA: 6 (ref 5.0–8.0)

## 2017-08-23 MED ORDER — NITROFURANTOIN MONOHYD MACRO 100 MG PO CAPS: 100.0000 mg | ORAL_CAPSULE | Freq: Two times a day (BID) | ORAL | 0 refills | Status: DC

## 2017-08-23 NOTE — Patient Instructions (Signed)
WE NOW OFFER   [facility] FAST TRACK!!!  SAME DAY Appointments for ACUTE CARE  Such as: Sprains, Injuries, cuts, abrasions, rashes, muscle pain, joint pain, back pain Colds, flu, sore throats, headache, allergies, cough, fever  Ear pain, sinus and eye infections Abdominal pain, nausea, vomiting, diarrhea, upset stomach Animal/insect bites  3 Easy Ways to Schedule: Walk-In Scheduling Call in scheduling Mychart Sign-up: [email or web address]         

## 2017-08-23 NOTE — Progress Notes (Signed)
   Subjective:    Patient ID: Kristine Francis, female    DOB: 02-06-1941, 76 y.o.   MRN: [id number]  HPI Here for the onset this morning of burning on urination. No fever or back pain. She was treated her in July for a Klebsiella UTI.  Of note she had been dealing with some fecal incontinence for a few months and she saw Dr. Silverio Decamp recently. They were able to change her diet such that her stools are no longer loose and she is fully continent again.    Review of Systems  Constitutional: Negative.   Respiratory: Negative.   Cardiovascular: Negative.   Gastrointestinal: Negative.   Genitourinary: Positive for dysuria and urgency. Negative for flank pain, frequency and hematuria.       Objective:   Physical Exam  Constitutional: She is oriented to person, place, and time. She appears well-developed and well-nourished.  Neck: No thyromegaly present.  Cardiovascular: Normal rate, regular rhythm, normal heart sounds and intact distal pulses.   Pulmonary/Chest: Effort normal and breath sounds normal. No respiratory distress. She has no wheezes. She has no rales.  Abdominal: Soft. Bowel sounds are normal. She exhibits no distension and no mass. There is no tenderness. There is no rebound and no guarding.  Lymphadenopathy:    She has no cervical adenopathy.  Neurological: She is alert and oriented to person, place, and time.          Assessment & Plan:  Recurrent UTI. Treat with Macrobid. Culture the sample. Hopefully now that the fecal incontinence has resolved she will not keep getting UTIs.  [name], MD

## 2017-08-24 LAB — URINE CULTURE
MICRO NUMBER:: 81098453
SPECIMEN QUALITY:: ADEQUATE

## 2017-08-25 ENCOUNTER — Ambulatory Visit (INDEPENDENT_AMBULATORY_CARE_PROVIDER_SITE_OTHER): Payer: Medicare Other

## 2017-08-25 VITALS — BP 110/56 | HR 73 | Ht 60.0 in | Wt 88.0 lb

## 2017-08-25 DIAGNOSIS — J301 Allergic rhinitis due to pollen: Secondary | ICD-10-CM | POA: Diagnosis not present

## 2017-08-25 DIAGNOSIS — J3089 Other allergic rhinitis: Secondary | ICD-10-CM | POA: Diagnosis not present

## 2017-08-25 DIAGNOSIS — Z0000 Encounter for general adult medical examination without abnormal findings: Secondary | ICD-10-CM

## 2017-08-25 DIAGNOSIS — J3081 Allergic rhinitis due to animal (cat) (dog) hair and dander: Secondary | ICD-10-CM | POA: Diagnosis not present

## 2017-08-25 NOTE — Progress Notes (Addendum)
Subjective:   Kristine Francis is a 76 y.o. female who presents for Medicare Annual (Subsequent) preventive examination.  The Patient was informed that the wellness visit is to identify future health risk and educate and initiate measures that can reduce risk for increased disease through the lifespan.    Annual Wellness Assessment  Reports health as good  Married x 17 years 3 sons  2 live in [place] and one lives in [place] the later 3 children; 10 and 69 yo are home schooled  The youngest is 4   Parents; mother had pancreatic cancer Father was cardiac arrest at 19   Preventive Screening -Counseling & Management  Medicare Annual Preventive Care Visit - Subsequent Last OV 08/23/2017 acute; 9/28 as well   Colonoscopy 02/2014- goes every 5 years  Mammogram 04/2017  Bone density 06/2015 (-1.9) Does not want to repeat now    VS reviewed;   Diet  Breakfast - bowl of oatmeal that she makes Brown sugar, cereal bar, or blueberry muffin Does not drink coffee Drinks hot tea Lunch generally a sandwich; cheese sandwich  Tomato sandwich buys some deli  Supper -cook in the am if she cooks Cans a lot of vegetables Has a garden;    [employer] Exercise - exercise conscious x 3 days a week when she walked Walking distance to the [employer] stops her from walking in the neighborhood Dx Gait Abnormality; reports hx that after waking up from sleeping her legs were "dead" with no feeling; vision changes Started in March; and has not getting better Does do exercises in local community for [employer] to [facility]; could not help her   [address] x 2 did not get hurt  Have not used a cane; has used a walker Careers information officer  Does well and is very safe with her issues  No stairs in the home   Dental-no issues   Stressors: no   Sleep patterns:  Sleeps well   Pain - no     Cardiac Risk Factors Addressed Hyperlipidemia - ratio 3; chol 202; hdl 75; ldl 116 and trig 56 Pre-diabetes FBS good;  a1c 6.0  Has been the same weight x 5 years and has always been small   Advanced Directives  Patient Care Team: [name], MD as PCP - General (Family Medicine) Dohmeier, Asencion Partridge, MD (Neurology) Jerrell Belfast, MD (Otolaryngology) Luberta Mutter, MD (Ophthalmology) Tiajuana Amass, MD (Allergy and Immunology) Barbaraann Rondo, MD (Obstetrics and Gynecology) Lafayette Dragon, MD (Inactive) (Gastroenterology)         Objective:     Vitals: BP (!) 110/56   Pulse 73   Ht 5' (1.524 m)   Wt 88 lb (39.9 kg)   SpO2 98%   BMI 17.19 kg/m   Body mass index is 17.19 kg/m.   Tobacco History  Smoking Status  . Never Smoker  Smokeless Tobacco  . Never Used     Counseling given: Yes   Past Medical History:  Diagnosis Date  . Allergy   . Asthma    sees Dr. Tiajuana Amass   . Ataxia   . Diverticulosis   . Emphysema   . Gait abnormality 05/14/2013   [name] a patient of Dr. Linda Hedges presented first interpreter thousand 13 with a gait dysfunction, she also had an episodic confusion and Loss device. Exam found a mild nystagmus and she was referred to ophthalmology on [id number], brain MRI was normal nystagmus was a secondary diagnosis to vestibulitis,  ear nose and throat has followed and had seen the patient. At the time of the nystagmus the patie  . GERD (gastroesophageal reflux disease)   . Hepatic steatosis   . Iron deficiency anemia, unspecified   . Kidney cysts 11/16/12   small cyst on left  . Nystagmus, end-position   . Osteopenia   . Renal cyst    Past Surgical History:  Procedure Laterality Date  . BREAST BIOPSY Bilateral 1970's  . CATARACT EXTRACTION Bilateral LT:02/14/13,RT:02/21/13  . CERVICAL SPINE SURGERY  2010  . COLONOSCOPY  03-12-14   per Dr. Olevia Perches, diverticulosis only, repeat 5 yrs   . EYE SURGERY Bilateral 01/2013&02/2013   left then right  . HERNIA REPAIR  Oct '14-left, remote-right   years ago right; left done '14  . SKIN CANCER EXCISION  05/13/13   FACE,  basal cell  . TONSILLECTOMY     Family History  Problem Relation Age of Onset  . Colon cancer Father   . Coronary artery disease Father   . Heart block Father   . Pancreatic cancer Mother   . Colon cancer Paternal Grandmother   . Colon cancer Paternal Aunt        x 2  . Cancer - Lung Paternal Aunt   . Breast cancer Paternal Aunt   . Colon cancer Paternal Aunt   . Ovarian cancer Maternal Aunt   . Heart attack Paternal Grandfather    History  Sexual Activity  . Sexual activity: Yes  . Partners: Male    Outpatient Encounter Prescriptions as of 08/25/2017  Medication Sig  . amLODipine (NORVASC) 10 MG tablet Take 1 tablet (10 mg total) by mouth daily.  . APPLE CIDER VINEGAR PO Take by mouth daily. Reported on 04/15/2016  . aspirin 325 MG tablet Take 325 mg by mouth daily.  Marland Kitchen CALCIUM-MAGNESIUM-ZINC PO Take 1 tablet by mouth 3 (three) times daily.  . Cholecalciferol (VITAMIN D PO) Take 1 capsule by mouth daily.  . Cranberry-Vitamin C-Probiotic (AZO CRANBERRY) 250-30 MG TABS Take 1 tablet by mouth daily.  . DHA-EPA-Vit B6-B12-Folic Acid (CARDIOVID PLUS) CAPS Take 1 capsule by mouth daily.  . furosemide (LASIX) 20 MG tablet Take 1 tablet (20 mg total) by mouth daily.  . Glucosamine Sulfate 1000 MG TABS Take 1 tablet by mouth daily.   Marland Kitchen losartan (COZAAR) 50 MG tablet Take 1 tablet (50 mg total) by mouth daily.  Marland Kitchen Lysine 500 MG TABS Take 1 tablet by mouth daily.   . Multiple Vitamins-Minerals (CENTRUM SILVER PO) Take 1 capsule by mouth daily.    . nitrofurantoin, macrocrystal-monohydrate, (MACROBID) 100 MG capsule Take 1 capsule (100 mg total) by mouth 2 (two) times daily.  . Nutritional Supplements (ADRENAL COMPLEX PO) Take 1 capsule by mouth daily.  Marland Kitchen omeprazole (PRILOSEC) 40 MG capsule Take 1 capsule (40 mg total) by mouth daily.  . ranitidine (ZANTAC) 150 MG tablet Take 1 tablet (150 mg total) by mouth at bedtime.  . Vinpocetine POWD 5 mg by Does not apply route 2 (two) times daily.    . vitamin E 400 UNIT capsule Take 400 Units by mouth daily.   No facility-administered encounter medications on file as of 08/25/2017.     Activities of Daily Living In your present state of health, do you have any difficulty performing the following activities: 08/25/2017  Hearing? N  Vision? Y  Difficulty concentrating or making decisions? N  Walking or climbing stairs? N  Dressing or bathing? N  Doing errands, shopping?  N  Preparing Food and eating ? N  Using the Toilet? N  In the past six months, have you accidently leaked urine? N  Do you have problems with loss of bowel control? N  Managing your Medications? N  Managing your Finances? N  Housekeeping or managing your Housekeeping? N  Some recent data might be hidden    Patient Care Team: [name], MD as PCP - General (Family Medicine) Dohmeier, Asencion Partridge, MD (Neurology) Jerrell Belfast, MD (Otolaryngology) Luberta Mutter, MD (Ophthalmology) Tiajuana Amass, MD (Allergy and Immunology) Barbaraann Rondo, MD (Obstetrics and Gynecology) Lafayette Dragon, MD (Inactive) (Gastroenterology)    Assessment:     Exercise Activities and Dietary recommendations Current Exercise Habits: Home exercise routine, Type of exercise: walking, Frequency (Times/Week): >7, Intensity: Mild (does not sit down except to eat )  Goals    . Exercise 150 minutes per week (moderate activity)          May join the [facility] and try the silver sneaker program        Fall Risk Fall Risk  08/25/2017 02/02/2017 02/02/2017 12/11/2015 11/10/2014  Falls in the past year? Yes Yes Yes Yes No  Number falls in past yr: 1 2 or more 2 or more 1 -  [place], but in [place]  - - - -  Injury with Fall? - No No - -  Risk for fall due to : Impaired balance/gait - Impaired balance/gait - -  Follow up Education provided - - Falls evaluation completed -  Comment uses rollator  - - - -   Depression Screen PHQ 2/9 Scores 08/25/2017 02/02/2017 02/02/2017  12/11/2015  PHQ - 2 Score 0 0 0 0     Cognitive Function   Ad8 score reviewed for issues:  Issues making decisions:  Less interest in hobbies / activities:  Repeats questions, stories (family complaining):  Trouble using ordinary gadgets (microwave, computer, phone):  Forgets the month or year:   Mismanaging finances:   Remembering appts:  Daily problems with thinking and/or memory: Ad8 score is=0 Does not remember as clearly as she used too. She is aware of what is going on;     6CIT Screen 08/25/2017  What Year? 0 points  What month? 0 points  What time? 0 points  Count back from 20 0 points  Months in reverse 0 points  Repeat phrase 0 points  Total Score 0    Immunization History  Administered Date(s) Administered  . Influenza Split 08/31/2011, 09/18/2012  . Influenza Whole 09/11/2008, 08/18/2009, 09/09/2010  . Influenza, High Dose Seasonal PF 09/18/2013, 09/07/2015, 09/20/2016, 08/18/2017  . Influenza,inj,Quad PF,6+ Mos 09/15/2014  . Pneumococcal Conjugate-13 01/29/2014  . Pneumococcal Polysaccharide-23 08/18/2009  . Tetanus 12/24/2012  . Zoster 04/24/2012   Screening Tests Health Maintenance  Topic Date Due  . COLONOSCOPY  03/13/2019  . TETANUS/TDAP  12/24/2022  . INFLUENZA VACCINE  Completed  . DEXA SCAN  Completed  . PNA vac Low Risk Adult  Completed      Plan:      PCP Notes   Health Maintenance Deferred repeat dexa for now Discussed calcium and Vit and exercise Agrees to attempt Y again and silver sneaker program and other balance classes in the community Has gait disturbance, but feels it may be slightly better;  Does not let this stop her from living her life and staying as active a possible  Updated colonoscopy to q 5 years  Educated regarding the shingrix  Abnormal Screens  Fall x 2 but no injury  Very careful; has one level home   Referrals  None today   Patient concerns; She feels she is doing well   Nurse  Concerns;  as noted  Safety issues discussed as she is at risk for falls   Next PCP apt Tbs; was just in with acute UTI, states this is resolving      I have personally reviewed and noted the following in the patient's chart:   . Medical and social history . Use of alcohol, tobacco or illicit drugs  . Current medications and supplements . Functional ability and status . Nutritional status . Physical activity . Advanced directives . List of other physicians . Hospitalizations, surgeries, and ER visits in previous 12 months . Vitals . Screenings to include cognitive, depression, and falls . Referrals and appointments  In addition, I have reviewed and discussed with patient certain preventive protocols, quality metrics, and best practice recommendations. A written personalized care plan for preventive services as well as general preventive health recommendations were provided to patient.     BJYNW,GNFAO, RN  08/25/2017   I have reviewed this note and agree with its contents.  [name], MD

## 2017-08-25 NOTE — Patient Instructions (Addendum)
Kristine Francis , Thank you for taking time to come for your Medicare Wellness Visit. I appreciate your ongoing commitment to your health goals. Please review the following plan we discussed and let me know if I can assist you in the future.   Check at some time to see if you completed the heatlh care POA and living will   Deaf & Hard of [place] - can assist with hearing aid x 1  No reviews  [facility]  [id number]  [phone]    These are the goals we discussed: Goals    . Exercise 150 minutes per week (moderate activity)          May join the [facility] and try the silver sneaker program         This is a list of the screening recommended for you and due dates:  Health Maintenance  Topic Date Due  . Colon Cancer Screening  03/13/2019  . Tetanus Vaccine  12/24/2022  . Flu Shot  Completed  . DEXA scan (bone density measurement)  Completed  . Pneumonia vaccines  Completed   Manufacturing engineer of Services Cost  A Matter of Balance Class locations vary. Call [place] on Aging for more information.  [email or web address] [phone] 8-Session program addressing the fear of falling and increasing activity levels of older adults Free to minimal cost  A.C.T. By [employer] [address], [place], [place] [address].  BetaBlues.[email or web address] [phone]  Personal training, gym, classes including Silver Sneakers* and ACTion for Aging Adults Fee-based  A.H.O.Y. (Add Health to [place]) Airs on Time [employer] 13, M-F at [place]: [employer],  [place] [facility] [place],  [place], [place] [facility], [address] [facility], [place], [place], [place] [address]  [place] Location: Sharrell Ku. [employer] [place]      [phone]  [phone]  [phone]  [phone]  [phone]  [phone]  [phone]  [phone]  [phone]  [phone]    [phone] A total-body conditioning class for adults 81 and older; designed to increase muscular strength, endurance, range of movement, flexibility, balance, agility and coordination Free  [facility] [place], [place] [address] [place]      [address]      [phone]      Pilate's class for individualsreturning to exercise after an injury, before or after surgery or for individuals with complex musculoskeletal issues; designed to improve strength, balance , flexibility      $15/class  [place] [address] [place], [place] [address] [email or web address] [place] classes for beginners to advanced [place], [place] [address] [email or web address] [place] Chair Exercises Free, ages 81 and older; Ages 59-59 fee based  Marvia Pickles, [facility] [address] [place], [place] [address] Seniorcenter_1 .Beverlee Nims [phone]  A.H.O.Y. Tai Chi Fee-based Donation based or free  [place] Tai Chi Class  locations vary.  Call or email Angela Burke or view website for more information. [email or web address] .[email or web address] [phone] Ongoing classes at local YMCAs and gyms Fee-based  Silver Sneakers A.C.T. By [place] [employer]: [place] [phone]  [phone] Classes designed for older adults who want to improve their strength, flexibility, balance and endurance.   Silver sneakers is covered by some insurance plans and includes a fitness center membership at participating locations. Find out more by calling [phone] or visiting [email or web address] Covered by some insurance plans  [facility] [phone] A.H.O.Y., fitness room, personal training, fitness classes for injury prevention, strength, balance, flexibility, water fitness classes Ages 55+: $32 for 6 months; Ages 109-54: $62 for 6 months  Tai Chi for Everybody [facility] [address] [place], [place] [address] Taichiforeverybody_1 .Patsi Sears [phone] Tai Chi classes for beginners to advanced; geared for seniors Donation Based      [employer] ([place] Others Participate in Exercise     Loyal Gambler. Rosana Hoes, PhD, [place] [email or web address] [place]     [phone]     A comprehensive fitness program for adults.  The program paris senior-level undergraduates Kinesiology students with adults who desire to learn how to exercise safely.  Includes a structural exercise class focusing on functional fitnesss     $100/semester in fall and spring; $75 in summer (no trainers)    *Silver Sneakers is covered by some Personal assistant and includes a  Radio producer at participating locations.  Find out more by calling [phone] or visiting [email or web address]  For additional health and human services resources for senior adults, please contact SeniorLine at [phone] in [place] and [place] at  [phone] in all other areas.   Prevention of falls: Remove rugs or any tripping hazards in the home Use Non slip mats in bathtubs and showers Placing grab bars next to the toilet and or shower Placing handrails on both sides of the stair way Adding extra lighting in the home.   Personal safety issues reviewed:  1. Consider starting a community watch program per [facility] 2.  Changes batteries is smoke detector and/or carbon monoxide detector  3.  If you have firearms; keep them in a safe place 4.  Wear protection when in the sun; Always wear sunscreen or a hat; It is good to have your doctor check your skin annually or review any new areas of concern 5. Driving safety; Keep in the right lane; stay 3 car lengths behind the car in front of you on the highway; look 3 times prior to pulling out; carry your cell phone everywhere you go!    Learn about the Yellow Dot program:  The program allows first responders at your emergency to have access to who your physician is, as well as your medications and medical conditions.  Citizens requesting the Yellow Dot Packages should contact Master Corporal Nunzio Cobbs at the [facility] [phone] for the first week of the program and beginning the week after Easter citizens should contact their Scientist, physiological.      Fall Prevention in the Home Falls can cause injuries. They can happen to people of all ages. There are many things you can do to  make your home safe and to help prevent falls. What can I do on the outside of my home?  Regularly fix the edges of walkways and driveways and fix any cracks.  Remove anything that might make you trip as you walk through a door, such as a raised step or threshold.  Trim any bushes or trees on the path to your home.  Use bright outdoor lighting.  Clear any walking paths of anything that might make someone trip, such as rocks or tools.  Regularly check to  see if handrails are loose or broken. Make sure that both sides of any steps have handrails.  Any raised decks and porches should have guardrails on the edges.  Have any leaves, snow, or ice cleared regularly.  Use sand or salt on walking paths during winter.  Clean up any spills in your garage right away. This includes oil or grease spills. What can I do in the bathroom?  Use night lights.  Install grab bars by the toilet and in the tub and shower. Do not use towel bars as grab bars.  Use non-skid mats or decals in the tub or shower.  If you need to sit down in the shower, use a plastic, non-slip stool.  Keep the floor dry. Clean up any water that spills on the floor as soon as it happens.  Remove soap buildup in the tub or shower regularly.  Attach bath mats securely with double-sided non-slip rug tape.  Do not have throw rugs and other things on the floor that can make you trip. What can I do in the bedroom?  Use night lights.  Make sure that you have a light by your bed that is easy to reach.  Do not use any sheets or blankets that are too big for your bed. They should not hang down onto the floor.  Have a firm chair that has side arms. You can use this for support while you get dressed.  Do not have throw rugs and other things on the floor that can make you trip. What can I do in the kitchen?  Clean up any spills right away.  Avoid walking on wet floors.  Keep items that you use a lot in easy-to-reach places.  If you need to reach something above you, use a strong step stool that has a grab bar.  Keep electrical cords out of the way.  Do not use floor polish or wax that makes floors slippery. If you must use wax, use non-skid floor wax.  Do not have throw rugs and other things on the floor that can make you trip. What can I do with my stairs?  Do not leave any items on the stairs.  Make sure that there are handrails on both sides of the stairs and use  them. Fix handrails that are broken or loose. Make sure that handrails are as long as the stairways.  Check any carpeting to make sure that it is firmly attached to the stairs. Fix any carpet that is loose or worn.  Avoid having throw rugs at the top or bottom of the stairs. If you do have throw rugs, attach them to the floor with carpet tape.  Make sure that you have a light switch at the top of the stairs and the bottom of the stairs. If you do not have them, ask someone to add them for you. What else can I do to help prevent falls?  Wear shoes that: ?  Do not have high heels. ? Have rubber bottoms. ? Are comfortable and fit you well. ? Are closed at the toe. Do not wear sandals.  If you use a stepladder: ? Make sure that it is fully opened. Do not climb a closed stepladder. ? Make sure that both sides of the stepladder are locked into place. ? Ask someone to hold it for you, if possible.  Clearly mark and make sure that you can see: ? Any grab bars or handrails. ? First and last steps. ? Where the edge of each step is.  Use tools that help you move around (mobility aids) if they are needed. These include: ? Canes. ? Walkers. ? Scooters. ? Crutches.  Turn on the lights when you go into a dark area. Replace any light bulbs as soon as they burn out.  Set up your furniture so you have a clear path. Avoid moving your furniture around.  If any of your floors are uneven, fix them.  If there are any pets around you, be aware of where they are.  Review your medicines with your doctor. Some medicines can make you feel dizzy. This can increase your chance of falling. Ask your doctor what other things that you can do to help prevent falls. This information is not intended to replace advice given to you by your health care provider. Make sure you discuss any questions you have with your health care provider. Document Released: 09/03/2009 Document Revised: 04/14/2016 Document Reviewed:  12/12/2014 Elsevier Interactive Patient Education  2018 [place] Maintenance, Female Adopting a healthy lifestyle and getting preventive care can go a long way to promote health and wellness. Talk with your health care provider about what schedule of regular examinations is right for you. This is a good chance for you to check in with your provider about disease prevention and staying healthy. In between checkups, there are plenty of things you can do on your own. Experts have done a lot of research about which lifestyle changes and preventive measures are most likely to keep you healthy. Ask your health care provider for more information. Weight and diet Eat a healthy diet  Be sure to include plenty of vegetables, fruits, low-fat dairy products, and lean protein.  Do not eat a lot of foods high in solid fats, added sugars, or salt.  Get regular exercise. This is one of the most important things you can do for your health. ? Most adults should exercise for at least 150 minutes each week. The exercise should increase your heart rate and make you sweat (moderate-intensity exercise). ? Most adults should also do strengthening exercises at least twice a week. This is in addition to the moderate-intensity exercise.  Maintain a healthy weight  Body mass index (BMI) is a measurement that can be used to identify possible weight problems. It estimates body fat based on height and weight. Your health care provider can help determine your BMI and help you achieve or maintain a healthy weight.  For females 59 years of age and older: ? A BMI below 18.5 is considered underweight. ? A BMI of 18.5 to 24.9 is normal. ? A BMI of 25 to 29.9 is considered overweight. ? A BMI of 30 and above is considered obese.  Watch levels of cholesterol and blood lipids  You should start having your blood tested for lipids and cholesterol at 75 years of age, then have this test every 5 years.  You may  need  to have your cholesterol levels checked more often if: ? Your lipid or cholesterol levels are high. ? You are older than 75 years of age. ? You are at high risk for heart disease.  Cancer screening Lung Cancer  Lung cancer screening is recommended for adults 70-27 years old who are at high risk for lung cancer because of a history of smoking.  A yearly low-dose CT scan of the lungs is recommended for people who: ? Currently smoke. ? Have quit within the past 15 years. ? Have at least a 30-pack-year history of smoking. A pack year is smoking an average of one pack of cigarettes a day for 1 year.  Yearly screening should continue until it has been 15 years since you quit.  Yearly screening should stop if you develop a health problem that would prevent you from having lung cancer treatment.  Breast Cancer  Practice breast self-awareness. This means understanding how your breasts normally appear and feel.  It also means doing regular breast self-exams. Let your health care provider know about any changes, no matter how small.  If you are in your 20s or 30s, you should have a clinical breast exam (CBE) by a health care provider every 1-3 years as part of a regular health exam.  If you are 7 or older, have a CBE every year. Also consider having a breast X-ray (mammogram) every year.  If you have a family history of breast cancer, talk to your health care provider about genetic screening.  If you are at high risk for breast cancer, talk to your health care provider about having an MRI and a mammogram every year.  Breast cancer gene (BRCA) assessment is recommended for women who have family members with BRCA-related cancers. BRCA-related cancers include: ? Breast. ? Ovarian. ? Tubal. ? Peritoneal cancers.  Results of the assessment will determine the need for genetic counseling and BRCA1 and BRCA2 testing.  Cervical Cancer Your health care provider may recommend that you be  screened regularly for cancer of the pelvic organs (ovaries, uterus, and vagina). This screening involves a pelvic examination, including checking for microscopic changes to the surface of your cervix (Pap test). You may be encouraged to have this screening done every 3 years, beginning at age 75.  For women ages 71-65, health care providers may recommend pelvic exams and Pap testing every 3 years, or they may recommend the Pap and pelvic exam, combined with testing for human papilloma virus (HPV), every 5 years. Some types of HPV increase your risk of cervical cancer. Testing for HPV may also be done on women of any age with unclear Pap test results.  Other health care providers may not recommend any screening for nonpregnant women who are considered low risk for pelvic cancer and who do not have symptoms. Ask your health care provider if a screening pelvic exam is right for you.  If you have had past treatment for cervical cancer or a condition that could lead to cancer, you need Pap tests and screening for cancer for at least 20 years after your treatment. If Pap tests have been discontinued, your risk factors (such as having a new sexual partner) need to be reassessed to determine if screening should resume. Some women have medical problems that increase the chance of getting cervical cancer. In these cases, your health care provider may recommend more frequent screening and Pap tests.  Colorectal Cancer  This type of cancer can be detected and often prevented.  Routine colorectal cancer screening usually begins at 75 years of age and continues through 75 years of age.  Your health care provider may recommend screening at an earlier age if you have risk factors for colon cancer.  Your health care provider may also recommend using home test kits to check for hidden blood in the stool.  A small camera at the end of a tube can be used to examine your colon directly (sigmoidoscopy or colonoscopy).  This is done to check for the earliest forms of colorectal cancer.  Routine screening usually begins at age 66.  Direct examination of the colon should be repeated every 5-10 years through 75 years of age. However, you may need to be screened more often if early forms of precancerous polyps or small growths are found.  Skin Cancer  Check your skin from head to toe regularly.  Tell your health care provider about any new moles or changes in moles, especially if there is a change in a mole's shape or color.  Also tell your health care provider if you have a mole that is larger than the size of a pencil eraser.  Always use sunscreen. Apply sunscreen liberally and repeatedly throughout the day.  Protect yourself by wearing long sleeves, pants, a wide-brimmed hat, and sunglasses whenever you are outside.  Heart disease, diabetes, and high blood pressure  High blood pressure causes heart disease and increases the risk of stroke. High blood pressure is more likely to develop in: ? People who have blood pressure in the high end of the normal range (130-139/85-89 mm Hg). ? People who are overweight or obese. ? People who are African American.  If you are 91-46 years of age, have your blood pressure checked every 3-5 years. If you are 69 years of age or older, have your blood pressure checked every year. You should have your blood pressure measured twice-once when you are at a hospital or clinic, and once when you are not at a hospital or clinic. Record the average of the two measurements. To check your blood pressure when you are not at a hospital or clinic, you can use: ? An automated blood pressure machine at a pharmacy. ? A home blood pressure monitor.  If you are between 20 years and 50 years old, ask your health care provider if you should take aspirin to prevent strokes.  Have regular diabetes screenings. This involves taking a blood sample to check your fasting blood sugar level. ? If  you are at a normal weight and have a low risk for diabetes, have this test once every three years after 75 years of age. ? If you are overweight and have a high risk for diabetes, consider being tested at a younger age or more often. Preventing infection Hepatitis B  If you have a higher risk for hepatitis B, you should be screened for this virus. You are considered at high risk for hepatitis B if: ? You were born in a country where hepatitis B is common. Ask your health care provider which countries are considered high risk. ? Your parents were born in a high-risk country, and you have not been immunized against hepatitis B (hepatitis B vaccine). ? You have HIV or AIDS. ? You use needles to inject street drugs. ? You live with someone who has hepatitis B. ? You have had sex with someone who has hepatitis B. ? You get hemodialysis treatment. ? You take certain medicines for conditions, including cancer, organ  transplantation, and autoimmune conditions.  Hepatitis C  Blood testing is recommended for: ? Everyone born from [birth year] through 1964. ? Anyone with known risk factors for hepatitis C.  Sexually transmitted infections (STIs)  You should be screened for sexually transmitted infections (STIs) including gonorrhea and chlamydia if: ? You are sexually active and are younger than 75 years of age. ? You are older than 74 years of age and your health care provider tells you that you are at risk for this type of infection. ? Your sexual activity has changed since you were last screened and you are at an increased risk for chlamydia or gonorrhea. Ask your health care provider if you are at risk.  If you do not have HIV, but are at risk, it may be recommended that you take a prescription medicine daily to prevent HIV infection. This is called pre-exposure prophylaxis (PrEP). You are considered at risk if: ? You are sexually active and do not regularly use condoms or know the HIV status of your  partner(s). ? You take drugs by injection. ? You are sexually active with a partner who has HIV.  Talk with your health care provider about whether you are at high risk of being infected with HIV. If you choose to begin PrEP, you should first be tested for HIV. You should then be tested every 3 months for as long as you are taking PrEP. Pregnancy  If you are premenopausal and you may become pregnant, ask your health care provider about preconception counseling.  If you may become pregnant, take 400 to 800 micrograms (mcg) of folic acid every day.  If you want to prevent pregnancy, talk to your health care provider about birth control (contraception). Osteoporosis and menopause  Osteoporosis is a disease in which the bones lose minerals and strength with aging. This can result in serious bone fractures. Your risk for osteoporosis can be identified using a bone density scan.  If you are 49 years of age or older, or if you are at risk for osteoporosis and fractures, ask your health care provider if you should be screened.  Ask your health care provider whether you should take a calcium or vitamin D supplement to lower your risk for osteoporosis.  Menopause may have certain physical symptoms and risks.  Hormone replacement therapy may reduce some of these symptoms and risks. Talk to your health care provider about whether hormone replacement therapy is right for you. Follow these instructions at home:  Schedule regular health, dental, and eye exams.  Stay current with your immunizations.  Do not use any tobacco products including cigarettes, chewing tobacco, or electronic cigarettes.  If you are pregnant, do not drink alcohol.  If you are breastfeeding, limit how much and how often you drink alcohol.  Limit alcohol intake to no more than 1 drink per day for nonpregnant women. One drink equals 12 ounces of beer, 5 ounces of wine, or 1 ounces of hard liquor.  Do not use street  drugs.  Do not share needles.  Ask your health care provider for help if you need support or information about quitting drugs.  Tell your health care provider if you often feel depressed.  Tell your health care provider if you have ever been abused or do not feel safe at home. This information is not intended to replace advice given to you by your health care provider. Make sure you discuss any questions you have with your health care provider. Document Released: 05/23/2011  Document Revised: 04/14/2016 Document Reviewed: 08/11/2015 Elsevier Interactive Patient Education  [employer].

## 2017-09-18 ENCOUNTER — Telehealth: Payer: Self-pay | Admitting: Family Medicine

## 2017-09-18 NOTE — Telephone Encounter (Signed)
Tomorrow begin taking 1/2 a tab a day of Losartan (25 mg)

## 2017-09-18 NOTE — Telephone Encounter (Signed)
I spoke with pt, she has been feeling dizzy for a week or so now. She just started Losartan around October 1st, readings toady have been 102/63, 104/64, worried that pressure is too low. Pt will hold Losartan on 09/19/2017 and see if dizziness goes away, will wait for a call back on Tuesday with further advice.

## 2017-09-18 NOTE — Telephone Encounter (Signed)
Pt wants to know if she should reduce her losartan (COZAAR) 50 MG tablet In half due to to being lightheaded.  Pt has not checked her bp lately, but will do that in the meantime.

## 2017-09-19 NOTE — Telephone Encounter (Signed)
Left message on machine for patient to return our call 

## 2017-09-20 NOTE — Telephone Encounter (Signed)
I spoke with pt and she will follow advice and we changed directions on medication list.

## 2017-09-22 DIAGNOSIS — J301 Allergic rhinitis due to pollen: Secondary | ICD-10-CM | POA: Diagnosis not present

## 2017-09-22 DIAGNOSIS — J3081 Allergic rhinitis due to animal (cat) (dog) hair and dander: Secondary | ICD-10-CM | POA: Diagnosis not present

## 2017-09-22 DIAGNOSIS — J3089 Other allergic rhinitis: Secondary | ICD-10-CM | POA: Diagnosis not present

## 2017-10-18 ENCOUNTER — Telehealth: Payer: Self-pay | Admitting: Neurology

## 2017-10-18 NOTE — Telephone Encounter (Signed)
Pt left a voicemail message asking for a call back from Dr Tat but did not say the reason for the call

## 2017-10-18 NOTE — Telephone Encounter (Signed)
Patient calling again about referral to [facility]. This was faxed at the beginning of the year and patient denied referral. Called back in September and wanted it re-sent. This was re-faxed, but called them today and they told her they do not have this referral.  Referral refaxed to [phone] with confirmation received.

## 2017-10-20 DIAGNOSIS — J3081 Allergic rhinitis due to animal (cat) (dog) hair and dander: Secondary | ICD-10-CM | POA: Diagnosis not present

## 2017-10-20 DIAGNOSIS — J301 Allergic rhinitis due to pollen: Secondary | ICD-10-CM | POA: Diagnosis not present

## 2017-10-20 DIAGNOSIS — J3089 Other allergic rhinitis: Secondary | ICD-10-CM | POA: Diagnosis not present

## 2017-10-24 DIAGNOSIS — R922 Inconclusive mammogram: Secondary | ICD-10-CM | POA: Diagnosis not present

## 2017-10-24 DIAGNOSIS — R921 Mammographic calcification found on diagnostic imaging of breast: Secondary | ICD-10-CM | POA: Diagnosis not present

## 2017-11-03 DIAGNOSIS — J301 Allergic rhinitis due to pollen: Secondary | ICD-10-CM | POA: Diagnosis not present

## 2017-11-03 DIAGNOSIS — J3089 Other allergic rhinitis: Secondary | ICD-10-CM | POA: Diagnosis not present

## 2017-11-03 DIAGNOSIS — J3081 Allergic rhinitis due to animal (cat) (dog) hair and dander: Secondary | ICD-10-CM | POA: Diagnosis not present

## 2017-11-23 DIAGNOSIS — J301 Allergic rhinitis due to pollen: Secondary | ICD-10-CM | POA: Diagnosis not present

## 2017-11-23 DIAGNOSIS — J3081 Allergic rhinitis due to animal (cat) (dog) hair and dander: Secondary | ICD-10-CM | POA: Diagnosis not present

## 2017-11-23 DIAGNOSIS — J3089 Other allergic rhinitis: Secondary | ICD-10-CM | POA: Diagnosis not present

## 2017-11-27 DIAGNOSIS — J3089 Other allergic rhinitis: Secondary | ICD-10-CM | POA: Diagnosis not present

## 2017-11-27 DIAGNOSIS — J3081 Allergic rhinitis due to animal (cat) (dog) hair and dander: Secondary | ICD-10-CM | POA: Diagnosis not present

## 2017-11-27 DIAGNOSIS — J301 Allergic rhinitis due to pollen: Secondary | ICD-10-CM | POA: Diagnosis not present

## 2017-12-11 DIAGNOSIS — J3081 Allergic rhinitis due to animal (cat) (dog) hair and dander: Secondary | ICD-10-CM | POA: Diagnosis not present

## 2017-12-11 DIAGNOSIS — J3089 Other allergic rhinitis: Secondary | ICD-10-CM | POA: Diagnosis not present

## 2017-12-11 DIAGNOSIS — J301 Allergic rhinitis due to pollen: Secondary | ICD-10-CM | POA: Diagnosis not present

## 2017-12-12 ENCOUNTER — Other Ambulatory Visit (INDEPENDENT_AMBULATORY_CARE_PROVIDER_SITE_OTHER): Payer: Medicare Other

## 2017-12-12 ENCOUNTER — Encounter: Payer: Self-pay | Admitting: Gastroenterology

## 2017-12-12 ENCOUNTER — Ambulatory Visit (INDEPENDENT_AMBULATORY_CARE_PROVIDER_SITE_OTHER): Payer: Medicare Other | Admitting: Gastroenterology

## 2017-12-12 VITALS — BP 110/60 | HR 58 | Ht 61.0 in | Wt 92.8 lb

## 2017-12-12 DIAGNOSIS — R197 Diarrhea, unspecified: Secondary | ICD-10-CM

## 2017-12-12 DIAGNOSIS — R152 Fecal urgency: Secondary | ICD-10-CM

## 2017-12-12 DIAGNOSIS — D508 Other iron deficiency anemias: Secondary | ICD-10-CM

## 2017-12-12 DIAGNOSIS — R159 Full incontinence of feces: Secondary | ICD-10-CM | POA: Diagnosis not present

## 2017-12-12 DIAGNOSIS — K641 Second degree hemorrhoids: Secondary | ICD-10-CM | POA: Diagnosis not present

## 2017-12-12 DIAGNOSIS — K582 Mixed irritable bowel syndrome: Secondary | ICD-10-CM | POA: Diagnosis not present

## 2017-12-12 DIAGNOSIS — K6289 Other specified diseases of anus and rectum: Secondary | ICD-10-CM

## 2017-12-12 LAB — IGA: IgA: 331 mg/dL (ref 68–378)

## 2017-12-12 NOTE — Patient Instructions (Addendum)
Take Benefiber 1 tablespoon three times a day with meals    We will refer you to see Earlie Counts for Physical Therapy for fecal leakage   Go to the basement today for labs   You have been scheduled to have an anorectal manometry at [facility] on 12/20/2017 at 12:30pm. Please arrive 30 minutes prior to your appointment time for registration (1st floor of the hospital-admissions).  Please make certain to use 1 Fleets enema 2 hours prior to coming for your appointment. You can purchase Fleets enemas from the laxative section at your drug store. You should not eat anything during the two hours prior to the procedure. You may take regular medications with small sips of water at least 2 hours prior to the study.  Anorectal manometry is a test performed to evaluate patients with constipation or fecal incontinence. This test measures the pressures of the anal sphincter muscles, the sensation in the rectum, and the neural reflexes that are needed for normal bowel movements.  THE PROCEDURE The test takes approximately 30 minutes to 1 hour. You will be asked to change into a hospital gown. A technician or nurse will explain the procedure to you, take a brief health history, and answer any questions you may have. The patient then lies on his or her left side. A small, flexible tube, about the size of a thermometer, with a balloon at the end is inserted into the rectum. The catheter is connected to a machine that measures the pressure. During the test, the small balloon attached to the catheter may be inflated in the rectum to assess the normal reflex pathways. The nurse or technician may also ask the person to squeeze, relax, and push at various times. The anal sphincter muscle pressures are measured during each of these maneuvers. To squeeze, the patient tightens the sphincter muscles as if trying to prevent anything from coming out. To push or bear down, the patient strains down as if trying to have a  bowel movement.

## 2017-12-12 NOTE — Progress Notes (Signed)
Kristine Francis    [id number]    10/27/1941  Primary Care Physician:Fry, Ishmael Holter, MD  Referring Physician: [name], MD [place], [place] [address]  Chief complaint: Fecal incontinence   HPI: 77 year old female with history of chronic irritable bowel syndrome, constipation alternating with diarrhea here with complaints of fecal incontinence.  She was sent a box of oranges from [place] by 1 of her friends, she had increased bowel frequency with loose stool during that period of time and she was eating oranges.  For past 2 weeks her bowel habits are back to normal with no diarrhea.  When she had episodes of diarrhea she had increased fecal incontinence with staining of her underwear.  She had pelvic floor injury during childbirth with her first child, was over 8 pounds.  She has a referral to see a neurologist at [facility] for peripheral neuropathy.  Denies any abdominal pain, vomiting, dysphagia, melena or blood per rectum.  Last colonoscopy April 2015: No polyps, retroflexion was not performed in the rectum due to poor sphincter tone.  Recall colonoscopy in 5 years due to family history of colon cancer in her father.   Outpatient Encounter Medications as of 12/12/2017  Medication Sig  . amLODipine (NORVASC) 10 MG tablet Take 1 tablet (10 mg total) by mouth daily.  . APPLE CIDER VINEGAR PO Take by mouth daily. Reported on 04/15/2016  . aspirin 325 MG tablet Take 325 mg by mouth daily.  Marland Kitchen CALCIUM-MAGNESIUM-ZINC PO Take 1 tablet by mouth 3 (three) times daily.  . Cholecalciferol (VITAMIN D PO) Take 1 capsule by mouth daily.  . Cranberry-Vitamin C-Probiotic (AZO CRANBERRY) 250-30 MG TABS Take 1 tablet by mouth daily.  . DHA-EPA-Vit B6-B12-Folic Acid (CARDIOVID PLUS) CAPS Take 1 capsule by mouth daily.  . furosemide (LASIX) 20 MG tablet Take 1 tablet (20 mg total) by mouth daily.  . Glucosamine Sulfate 1000 MG TABS Take 1 tablet by mouth daily.   Marland Kitchen losartan  (COZAAR) 50 MG tablet Take 1 tablet (50 mg total) by mouth daily. (Patient taking differently: Take 25 mg by mouth daily. )  . Lysine 500 MG TABS Take 1 tablet by mouth daily.   . Multiple Vitamins-Minerals (CENTRUM SILVER PO) Take 1 capsule by mouth daily.    . nitrofurantoin, macrocrystal-monohydrate, (MACROBID) 100 MG capsule Take 1 capsule (100 mg total) by mouth 2 (two) times daily.  . Nutritional Supplements (ADRENAL COMPLEX PO) Take 1 capsule by mouth daily.  Marland Kitchen omeprazole (PRILOSEC) 40 MG capsule Take 1 capsule (40 mg total) by mouth daily.  . ranitidine (ZANTAC) 150 MG tablet Take 1 tablet (150 mg total) by mouth at bedtime.  . Vinpocetine POWD 5 mg by Does not apply route 2 (two) times daily.  . vitamin E 400 UNIT capsule Take 400 Units by mouth daily.   No facility-administered encounter medications on file as of 12/12/2017.     Allergies as of 12/12/2017 - Review Complete 08/25/2017  Allergen Reaction Noted  . Contrast media [iodinated diagnostic agents] Other (See Comments) 08/02/2013  . Iohexol Other (See Comments) 03/24/2004  . Lisinopril Cough 08/18/2017    Past Medical History:  Diagnosis Date  . Allergy   . Asthma    sees Dr. Tiajuana Amass   . Ataxia   . Diverticulosis   . Emphysema   . Gait abnormality 05/14/2013   Ms.Lindell a patient of Dr. Linda Hedges presented first interpreter thousand 13 with  a gait dysfunction, she also had an episodic confusion and Loss device. Exam found a mild nystagmus and she was referred to ophthalmology on 03-2812, brain MRI was normal nystagmus was a secondary diagnosis to vestibulitis, ear nose and throat has followed and had seen the patient. At the time of the nystagmus the patie  . GERD (gastroesophageal reflux disease)   . Hepatic steatosis   . Iron deficiency anemia, unspecified   . Kidney cysts 11/16/12   small cyst on left  . Nystagmus, end-position   . Osteopenia   . Renal cyst     Past Surgical History:  Procedure Laterality  Date  . BREAST BIOPSY Bilateral 1970's  . CATARACT EXTRACTION Bilateral LT:02/14/13,RT:02/21/13  . CERVICAL SPINE SURGERY  2010  . COLONOSCOPY  03-12-14   per Dr. Olevia Perches, diverticulosis only, repeat 5 yrs   . EYE SURGERY Bilateral 01/2013&02/2013   left then right  . HERNIA REPAIR  Oct '14-left, remote-right   years ago right; left done '14  . SKIN CANCER EXCISION  05/13/13   FACE, basal cell  . TONSILLECTOMY      Family History  Problem Relation Age of Onset  . Colon cancer Father   . Coronary artery disease Father   . Heart block Father   . Pancreatic cancer Mother   . Colon cancer Paternal Grandmother   . Colon cancer Paternal Aunt        x 2  . Cancer - Lung Paternal Aunt   . Breast cancer Paternal Aunt   . Colon cancer Paternal Aunt   . Ovarian cancer Maternal Aunt   . Heart attack Paternal Grandfather     Social History   Socioeconomic History  . Marital status: Married    Spouse name: Not on file  . Number of children: 3  . Years of education: 61  . Highest education level: Not on file  Social Needs  . Financial resource strain: Not on file  . Food insecurity - worry: Not on file  . Food insecurity - inability: Not on file  . Transportation needs - medical: Not on file  . Transportation needs - non-medical: Not on file  Occupational History  . 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 Smoking status: Never Smoker  . Smokeless tobacco: Never Used  Substance and Sexual Activity  . Alcohol use: No    Alcohol/week: 0.0 oz  . Drug use: No  . Sexual activity: Yes    Partners: Male  Other Topics Concern  . 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 married - 1965. 3 sons - '66, '69, '70. Sons in good health. Marriage in good health. Enjoys retirement - remains active.            Review of systems: Review of Systems  Constitutional: Negative for  fever and chills.  HENT: Positive for sinus problem.   Eyes: Negative for blurred vision.  Respiratory: Negative for cough, shortness of breath and wheezing.   Cardiovascular: Negative for chest pain and palpitations.  Gastrointestinal: as per HPI Genitourinary: Negative for dysuria, urgency, frequency and hematuria.  Musculoskeletal: Negative for myalgias, back pain and joint pain.  Skin: Negative for itching and rash.  Neurological: Negative for dizziness, tremors, focal weakness, seizures and loss of consciousness. Positive for problem with walking or balance Endo/Heme/Allergies: Positive for seasonal allergies.  Psychiatric/Behavioral: Negative  for depression, suicidal ideas and hallucinations.  All other systems reviewed and are negative.   Physical Exam: Vitals:   12/12/17 0902  BP: 110/60  Pulse: (!) 58  SpO2: 99%   Body mass index is 17.53 kg/m. Gen:      No acute distress HEENT:  EOMI, sclera anicteric Neck:     No masses; no thyromegaly Lungs:    Clear to auscultation bilaterally; normal respiratory effort CV:         Regular rate and rhythm; no murmurs Abd:      + bowel sounds; soft, non-tender; no palpable masses, no distension Ext:    No edema; adequate peripheral perfusion Skin:      Warm and dry; no rash Neuro: alert and oriented x 3 Psych: normal mood and affect Rectal exam: Poor anal sphincter tone with no significant augmentation on squeeze, no anal fissure or external hemorrhoids Anoscopy: Small internal hemorrhoids, no active bleeding, normal dentate line, no visible nodules  Data Reviewed:  Reviewed labs, radiology imaging, old records and pertinent past GI work up   Assessment and Plan/Recommendations:  77 year old female with history of irritable bowel syndrome with constipation and alternating diarrhea associated with fecal incontinence Based on exam patient does have significant pelvic floor dyssynergy and weak anal sphincter Scheduled for  anorectal manometry to evaluate, will refer to pelvic floor physical therapy and biofeedback based on findings Start Benefiber 1 tablespoon 3 times daily with meals We will check TTG IgA antibody and IgA level to exclude celiac disease, patient thinks gluten may be worsening her symptoms Advised patient to avoid foods with high sugar content and limit portion size   25 minutes was spent face-to-face with the patient. Greater than 50% of the time used for counseling as well as treatment plan and follow-up. She had multiple questions which were answered to her satisfaction  Damaris Hippo , MD [phone] Mon-Fri 8a-5p [phone] after 5p, weekends, holidays  CC: [name], MD

## 2017-12-13 LAB — TISSUE TRANSGLUTAMINASE, IGA: (tTG) Ab, IgA: 1 U/mL

## 2017-12-19 NOTE — Progress Notes (Signed)
Called to remind patient of appointment tomorrow for anal manometry at [facility] at 1230. Pt family member stated she was planning to come.

## 2017-12-20 ENCOUNTER — Encounter (HOSPITAL_COMMUNITY)
Admission: RE | Disposition: A | Discharge status: Home / Self Care | Payer: Self-pay | Source: Ambulatory Visit | Attending: Gastroenterology

## 2017-12-20 ENCOUNTER — Ambulatory Visit (HOSPITAL_COMMUNITY)
Admission: RE | Admit: 2017-12-20 | Discharge: 2017-12-20 | Disposition: A | Discharge status: Home / Self Care | Payer: Medicare Other | Source: Ambulatory Visit | Attending: Gastroenterology | Admitting: Gastroenterology

## 2017-12-20 ENCOUNTER — Encounter (HOSPITAL_COMMUNITY): Payer: Self-pay | Admitting: *Deleted

## 2017-12-20 DIAGNOSIS — J3089 Other allergic rhinitis: Secondary | ICD-10-CM | POA: Diagnosis not present

## 2017-12-20 DIAGNOSIS — R159 Full incontinence of feces: Secondary | ICD-10-CM | POA: Diagnosis not present

## 2017-12-20 DIAGNOSIS — J301 Allergic rhinitis due to pollen: Secondary | ICD-10-CM | POA: Diagnosis not present

## 2017-12-20 DIAGNOSIS — J3081 Allergic rhinitis due to animal (cat) (dog) hair and dander: Secondary | ICD-10-CM | POA: Diagnosis not present

## 2017-12-20 HISTORY — PX: ANAL RECTAL MANOMETRY: SHX6358

## 2017-12-20 SURGERY — MANOMETRY, ANORECTAL

## 2017-12-20 NOTE — Progress Notes (Signed)
Anorectal manometry performed per protocol.  Patient tolerated well.  Balloon expulsion then done with 50cc balloon.  Patient able to expel in less than 5 seconds.  Trace amount of blood present with expulsion.  Report to be sent to Dr. Silverio Decamp today.

## 2017-12-21 ENCOUNTER — Encounter (HOSPITAL_COMMUNITY): Payer: Self-pay | Admitting: Gastroenterology

## 2017-12-21 DIAGNOSIS — R159 Full incontinence of feces: Secondary | ICD-10-CM

## 2017-12-26 ENCOUNTER — Other Ambulatory Visit: Payer: Self-pay | Admitting: Family Medicine

## 2017-12-26 DIAGNOSIS — J3081 Allergic rhinitis due to animal (cat) (dog) hair and dander: Secondary | ICD-10-CM | POA: Diagnosis not present

## 2017-12-26 DIAGNOSIS — J3089 Other allergic rhinitis: Secondary | ICD-10-CM | POA: Diagnosis not present

## 2017-12-26 DIAGNOSIS — J301 Allergic rhinitis due to pollen: Secondary | ICD-10-CM | POA: Diagnosis not present

## 2018-01-02 ENCOUNTER — Ambulatory Visit: Payer: Medicare Other | Attending: Gastroenterology | Admitting: Physical Therapy

## 2018-01-02 ENCOUNTER — Encounter: Payer: Self-pay | Admitting: Physical Therapy

## 2018-01-02 ENCOUNTER — Other Ambulatory Visit: Payer: Self-pay

## 2018-01-02 DIAGNOSIS — M25652 Stiffness of left hip, not elsewhere classified: Secondary | ICD-10-CM | POA: Diagnosis not present

## 2018-01-02 DIAGNOSIS — R252 Cramp and spasm: Secondary | ICD-10-CM | POA: Diagnosis not present

## 2018-01-02 DIAGNOSIS — M6281 Muscle weakness (generalized): Secondary | ICD-10-CM | POA: Diagnosis not present

## 2018-01-02 DIAGNOSIS — M25651 Stiffness of right hip, not elsewhere classified: Secondary | ICD-10-CM | POA: Diagnosis not present

## 2018-01-02 NOTE — Patient Instructions (Signed)
   Lie on your side and tighten anus and imagine lifting up like drawing a marble into the anus - repeat 10x, do 3-5 sets per day   [facility] [address] [place], [place] [address] Phone # [phone] Fax [phone]

## 2018-01-03 NOTE — Therapy (Signed)
[facility] [address] [place], [place], [address] Phone: [phone]   Fax:  [phone]  Physical Therapy Evaluation  Patient Details  Name: Kristine Francis MRN: [id number] Date of Birth: Mar 30, 1941 Referring Provider: Mauri Pole, MD   Encounter Date: 01/02/2018  PT End of Session - 01/02/18 1716    Visit Number  1    Date for PT Re-Evaluation  02/27/18    PT Start Time  9798    PT Stop Time  1616    PT Time Calculation (min)  43 min    Activity Tolerance  Patient tolerated treatment well    Behavior During Therapy  [facility] for tasks assessed/performed       Past Medical History:  Diagnosis Date  . Allergy   . Asthma    sees Dr. Tiajuana Amass   . Ataxia   . Diverticulosis   . Emphysema   . Gait abnormality 05/14/2013   Ms.Covell a patient of Dr. Linda Hedges presented first interpreter thousand 13 with a gait dysfunction, she also had an episodic confusion and Loss device. Exam found a mild nystagmus and she was referred to ophthalmology on 03-2812, brain MRI was normal nystagmus was a secondary diagnosis to vestibulitis, ear nose and throat has followed and had seen the patient. At the time of the nystagmus the patie  . GERD (gastroesophageal reflux disease)   . Hepatic steatosis   . Iron deficiency anemia, unspecified   . Kidney cysts 11/16/12   small cyst on left  . Nystagmus, end-position   . Osteopenia   . Renal cyst     Past Surgical History:  Procedure Laterality Date  . ANAL RECTAL MANOMETRY N/A 12/20/2017   Procedure: ANO RECTAL MANOMETRY;  Surgeon: Mauri Pole, MD;  Location: [facility];  Service: Endoscopy;  Laterality: N/A;  . BREAST BIOPSY Bilateral 1970's  . CATARACT EXTRACTION Bilateral LT:02/14/13,RT:02/21/13  . CERVICAL SPINE SURGERY  2010  . COLONOSCOPY  03-12-14   per Dr. Olevia Perches, diverticulosis only, repeat 5 yrs   . EYE SURGERY Bilateral 01/2013&02/2013   left then right  .  HERNIA REPAIR  Oct '14-left, remote-right   years ago right; left done '14  . SKIN CANCER EXCISION  05/13/13   FACE, basal cell  . TONSILLECTOMY      There were no vitals filed for this visit.   Subjective Assessment - 01/02/18 1533    Subjective  Pt has had fecal incontinence recently that came about a few weeks ago and she states it has gotten a litle better since then with benefiber.  Stool consistency has been better and hasn't had leakage.      Limitations  Other (comment) bending or pressure on the abdomen    Patient Stated Goals  walk straighter and strengthen pelvic    Currently in Pain?  No/denies         [facility] PT Assessment - 01/03/18 0001      Assessment   Medical Diagnosis  fecal incontinence    Referring Provider  Mauri Pole, MD    Onset Date/Surgical Date  -- a few weeks ago    Prior Therapy  No      Precautions   Precautions  None      Restrictions   Weight Bearing Restrictions  Yes      Balance Screen   Has the patient fallen in the past 6 months  No      Home Environment  Living Environment  Private residence    Living Arrangements  Spouse/significant other      Prior Function   Level of Independence  Independent    Leisure  active, does yard work, walking      Cognition   Overall Cognitive Status  Within Functional Limits for tasks assessed      Observation/Other Assessments   Focus on Therapeutic Outcomes (FOTO)   FISI score = 19/24      AROM   Overall AROM Comments  hip extension 5 deg bilaterally      Strength   Right Hip Flexion  4-/5    Right Hip Extension  4-/5    Right Hip External Rotation   4/5    Right Hip Internal Rotation  4/5    Right Hip ABduction  4+/5    Right Hip ADduction  4-/5    Left Hip Flexion  4-/5    Left Hip Extension  4-/5    Left Hip External Rotation  4/5    Left Hip Internal Rotation  4/5    Left Hip ABduction  4+/5    Left Hip ADduction  4-/5      Flexibility   Soft Tissue Assessment /Muscle  Length  yes    Hamstrings  25% limited      Special Tests   Other special tests  single leg stand - unable to balance >2 sec bilaterally; when performed on Rt LE was unable to catch herself and needs min A to prevent fall      Ambulation/Gait   Gait Pattern  Trunk flexed;Decreased step length - right;Decreased step length - left             Objective measurements completed on examination: See above findings.    Pelvic Floor Special Questions - 01/03/18 0001    Prior Pelvic/Prostate Exam  Yes    Are you Pregnant or attempting pregnancy?  No    Prior Pregnancies  Yes    Number of Pregnancies  3    Number of Vaginal Deliveries  3    Any difficulty with labor and deliveries  Yes    Episiotomy Performed  Yes    Currently Sexually Active  Yes    Is this Painful  -- sometimes    Marinoff Scale  discomfort that does not affect completion    Urinary Leakage  No 1 pad/ day    Urinary urgency  No    Fluid intake  a little water throughout    Caffeine beverages  2 cups    Falling out feeling (prolapse)  No    Skin Integrity  Intact;Irritaion present at    Skin Integrity Irritation Present at  mild irritation around anus    Pelvic Floor Internal Exam  pt informed and consent given to perform internal assessment of pelvic floor    Exam Type  Rectal    Sensation  normal    Palpation  demonstrates bulging when coughing, low tone throughout pelvic floor via rectal assessment    Strength  weak squeeze, no lift    Strength # of reps  2    Strength # of seconds  1    Tone  low       [facility] Adult PT Treatment/Exercise - 01/03/18 0001      Self-Care   Self-Care  Other Self-Care Comments    Other Self-Care Comments   educated and performed HEP with tactile and verbal cues  PT Education - 01/02/18 1613    Education provided  Yes    Education Details  sidelying pelvic contraction    Person(s) Educated  Patient    Methods  Explanation;Demonstration;Tactile  cues;Verbal cues;Handout    Comprehension  Verbalized understanding;Returned demonstration       PT Short Term Goals - 01/03/18 0823      PT SHORT TERM GOAL #1   Title  pt will be independent with initial HEP    Time  4    Period  Weeks    Status  New    Target Date  01/30/18      PT SHORT TERM GOAL #2   Title  pt will be able to contract pelvic floor and hold contraction when coughing    Time  4    Period  Weeks    Status  New    Target Date  01/30/18      PT SHORT TERM GOAL #3   Title  Pt will perform TUG test and appropriate goal created for balance    Time  4    Period  Weeks    Status  New    Target Date  01/30/18      PT SHORT TERM GOAL #4   Title  pt will be able to stand on single leg for 2 seconds without assistance to prevent fall    Time  4    Period  Weeks    Status  New    Target Date  01/30/18      PT SHORT TERM GOAL #5   Title  ...        PT Long Term Goals - 01/03/18 2423      PT LONG TERM GOAL #1   Title  pt will be ind with advanced HEP    Time  8    Period  Weeks    Status  New    Target Date  02/27/18      PT LONG TERM GOAL #2   Title  pt will be able to sustain 2/5 contraction for 5 sec to demonstrate improved endurance for bowel control    Time  8    Period  Weeks    Status  New    Target Date  02/27/18      PT LONG TERM GOAL #3   Title  pt will be able to demonstrate hip extension to neutral for improved gait    Time  8    Period  Weeks    Status  New    Target Date  02/27/18      PT LONG TERM GOAL #4   Title  pt will improve FISI score to 21/24    Time  8    Period  Weeks    Status  New    Target Date  02/27/18             Plan - 01/02/18 1718    Clinical Impression Statement  Pt presents to clinic due to recent onset of fecal incontinence.  Pt ambulates with unsteady and slow gait.  She is unable to balance on single leg and needed min A from PT to prevent fall when attempting to stand on right LE.  During  assessment  of pelvis, she demonstrates low tone throughout with little contraction of puborectalis and 2/5 MMT rectally.  She is only able to sustain contraction for 1 sec and performed 2 reps.  Pt bulges pelvic floor  when coughing and has difficulty coordinating breath with pelvic floor contraction.  Pt has normal pelvic alignment.  She has tight hamstrings and decreased AROM of hip extension bilaterally.  Pt will benefit from skilled PT to work on addressing these impairments in order to improve self care activities and reduce risk of falls    History and Personal Factors relevant to plan of care:  gait disturbance, history includes hernia repair, vaginal deliveries with tearing    Clinical Presentation  Stable    Clinical Presentation due to:  pt's condition is stable at this time    Clinical Decision Making  Moderate    Rehab Potential  Good    Clinical Impairments Affecting Rehab Potential  gait disturbance, history includes hernia repair, vaginal deliveries with tearing    PT Frequency  1x / week    PT Duration  8 weeks    PT Treatment/Interventions  ADLs/Self Care Home Management;Biofeedback;Canalith Repostioning;Cryotherapy;Electrical Stimulation;Moist Heat;Balance training;Therapeutic exercise;Therapeutic activities;Stair training;Gait training;Neuromuscular re-education;Patient/family education;Manual techniques;Passive range of motion;Dry needling;Taping    PT Next Visit Plan  TUG test, biofeedback for pelvic floor EMG, progress strength as able, lumbar ROM, hip flexor stretch, balance    Consulted and Agree with Plan of Care  Patient       Patient will benefit from skilled therapeutic intervention in order to improve the following deficits and impairments:  Abnormal gait, Impaired tone, Decreased strength, Decreased range of motion, Difficulty walking  Visit Diagnosis: Muscle weakness (generalized)  Cramp and spasm  Stiffness of left hip, not elsewhere classified  Stiffness of  right hip, not elsewhere classified     Problem List Patient Active Problem List   Diagnosis Date Noted  . Incontinence of feces   . Visual disturbances 02/06/2017  . Essential hypertension 11/07/2016  . Hyperlipidemia 04/24/2015  . Dysuria 02/11/2015  . Acute cystitis without hematuria 02/11/2015  . Chest pain 02/06/2015  . Left inguinal hernia 08/02/2013  . Gait abnormality 05/14/2013  . Gait disorder   . Nystagmus, end-position   . Routine health maintenance 12/13/2011  . LEG CRAMPS, NOCTURNAL 12/07/2010  . CALLUS, TOE 03/19/2010  . hip pain 03/19/2010  . ATAXIA 11/17/2008  . EMPHYSEMA 10/21/2008  . CERVICAL RADICULOPATHY, RIGHT 10/21/2008  . DIVERTICULOSIS OF COLON 08/14/2008  . RENAL CYST 08/14/2008  . ANEMIA-IRON DEFICIENCY 06/16/2007  . Asthma 06/16/2007  . GERD 06/16/2007  . OSTEOPENIA 06/16/2007    Zannie Cove, PT 01/03/2018, 8:29 AM  [facility] [address] [place], [place], [address] Phone: [phone]   Fax:  [phone]  Name: Kristine Francis MRN: [id number] Date of Birth: 12-Jun-1941

## 2018-01-08 DIAGNOSIS — J301 Allergic rhinitis due to pollen: Secondary | ICD-10-CM | POA: Diagnosis not present

## 2018-01-08 DIAGNOSIS — J3081 Allergic rhinitis due to animal (cat) (dog) hair and dander: Secondary | ICD-10-CM | POA: Diagnosis not present

## 2018-01-08 DIAGNOSIS — J3089 Other allergic rhinitis: Secondary | ICD-10-CM | POA: Diagnosis not present

## 2018-01-09 ENCOUNTER — Ambulatory Visit: Payer: Medicare Other | Admitting: Physical Therapy

## 2018-01-09 DIAGNOSIS — M25651 Stiffness of right hip, not elsewhere classified: Secondary | ICD-10-CM | POA: Diagnosis not present

## 2018-01-09 DIAGNOSIS — R252 Cramp and spasm: Secondary | ICD-10-CM

## 2018-01-09 DIAGNOSIS — M6281 Muscle weakness (generalized): Secondary | ICD-10-CM

## 2018-01-09 DIAGNOSIS — M25652 Stiffness of left hip, not elsewhere classified: Secondary | ICD-10-CM | POA: Diagnosis not present

## 2018-01-09 NOTE — Therapy (Signed)
[facility] [address] [place], [place], [address] Phone: [phone]   Fax:  [phone]  Physical Therapy Treatment  Patient Details  Name: Kristine Francis MRN: [id number] Date of Birth: 08-Jun-1941 Referring Provider: Mauri Pole, MD   Encounter Date: 01/09/2018  PT End of Session - 01/09/18 1556    Visit Number  2    Date for PT Re-Evaluation  02/27/18    PT Start Time  3474    PT Stop Time  1450    PT Time Calculation (min)  47 min    Activity Tolerance  Patient tolerated treatment well    Behavior During Therapy  [facility] for tasks assessed/performed       Past Medical History:  Diagnosis Date  . Allergy   . Asthma    sees Dr. Tiajuana Amass   . Ataxia   . Diverticulosis   . Emphysema   . Gait abnormality 05/14/2013   Ms.Turcott a patient of Dr. Linda Hedges presented first interpreter thousand 13 with a gait dysfunction, she also had an episodic confusion and Loss device. Exam found a mild nystagmus and she was referred to ophthalmology on 03-2812, brain MRI was normal nystagmus was a secondary diagnosis to vestibulitis, ear nose and throat has followed and had seen the patient. At the time of the nystagmus the patie  . GERD (gastroesophageal reflux disease)   . Hepatic steatosis   . Iron deficiency anemia, unspecified   . Kidney cysts 11/16/12   small cyst on left  . Nystagmus, end-position   . Osteopenia   . Renal cyst     Past Surgical History:  Procedure Laterality Date  . ANAL RECTAL MANOMETRY N/A 12/20/2017   Procedure: ANO RECTAL MANOMETRY;  Surgeon: Mauri Pole, MD;  Location: [facility];  Service: Endoscopy;  Laterality: N/A;  . BREAST BIOPSY Bilateral 1970's  . CATARACT EXTRACTION Bilateral LT:02/14/13,RT:02/21/13  . CERVICAL SPINE SURGERY  2010  . COLONOSCOPY  03-12-14   per Dr. Olevia Perches, diverticulosis only, repeat 5 yrs   . EYE SURGERY Bilateral 01/2013&02/2013   left then right  .  HERNIA REPAIR  Oct '14-left, remote-right   years ago right; left done '14  . SKIN CANCER EXCISION  05/13/13   FACE, basal cell  . TONSILLECTOMY      There were no vitals filed for this visit.  Subjective Assessment - 01/09/18 1416    Subjective  Patient states she hasn't had any fecal leakage.  She had urinary leakage today and is usually 1x/day when out and can't get to the bathroom. I was out this morning.    Patient Stated Goals  walk straighter and strengthen pelvic    Currently in Pain?  No/denies                      [facility] Adult PT Treatment/Exercise - 01/09/18 0001      Neuro Re-ed    Neuro Re-ed Details   biofeed back on surface of anal sphincters: resting 2.24mV, contract relax max 13.73mV, 10 sec hold ave 7.75mV, 20 sec hold      Exercises   Exercises  Knee/Hip      Knee/Hip Exercises: Supine   Bridges  Strengthening;10 reps pelvic floor bracing with lifting    Bridges Limitations  clam in supine with yellow band 15x      Knee/Hip Exercises: Sidelying   Clams  yellow band - 15 x each side  PT Short Term Goals - 01/03/18 4627      PT SHORT TERM GOAL #1   Title  pt will be independent with initial HEP    Time  4    Period  Weeks    Status  New    Target Date  01/30/18      PT SHORT TERM GOAL #2   Title  pt will be able to contract pelvic floor and hold contraction when coughing    Time  4    Period  Weeks    Status  New    Target Date  01/30/18      PT SHORT TERM GOAL #3   Title  Pt will perform TUG test and appropriate goal created for balance    Time  4    Period  Weeks    Status  New    Target Date  01/30/18      PT SHORT TERM GOAL #4   Title  pt will be able to stand on single leg for 2 seconds without assistance to prevent fall    Time  4    Period  Weeks    Status  New    Target Date  01/30/18      PT SHORT TERM GOAL #5   Title  ...        PT Long Term Goals - 01/03/18 0350      PT LONG TERM GOAL  #1   Title  pt will be ind with advanced HEP    Time  8    Period  Weeks    Status  New    Target Date  02/27/18      PT LONG TERM GOAL #2   Title  pt will be able to sustain 2/5 contraction for 5 sec to demonstrate improved endurance for bowel control    Time  8    Period  Weeks    Status  New    Target Date  02/27/18      PT LONG TERM GOAL #3   Title  pt will be able to demonstrate hip extension to neutral for improved gait    Time  8    Period  Weeks    Status  New    Target Date  02/27/18      PT LONG TERM GOAL #4   Title  pt will improve FISI score to 21/24    Time  8    Period  Weeks    Status  New    Target Date  02/27/18            Plan - 01/09/18 1509    Clinical Impression Statement  Patient was able to produce contractions of anal sphincter muscles and hold for up to 3 seconds.  She was able to demonstrate relaxing down to baseline showing good control of pelvic floor muscle activity.  Pt performed all exercises well with visual and did not need visual cues at the end of treatment today.  Pt will benefit from skilled PT to work on improved muscle endurance for greater self care activities.    Clinical Impairments Affecting Rehab Potential  gait disturbance, history includes hernia repair, vaginal deliveries with tearing    PT Treatment/Interventions  ADLs/Self Care Home Management;Biofeedback;Canalith Repostioning;Cryotherapy;Electrical Stimulation;Moist Heat;Balance training;Therapeutic exercise;Therapeutic activities;Stair training;Gait training;Neuromuscular re-education;Patient/family education;Manual techniques;Passive range of motion;Dry needling;Taping    PT Next Visit Plan  TUG test, progress pelvic and core strength as able, lumbar ROM,  hip flexor stretch, balance, biofeedback for pelvic floor EMG    Recommended Other Services  order signed    Consulted and Agree with Plan of Care  Patient       Patient will benefit from skilled therapeutic  intervention in order to improve the following deficits and impairments:  Abnormal gait, Impaired tone, Decreased strength, Decreased range of motion, Difficulty walking  Visit Diagnosis: Muscle weakness (generalized)  Cramp and spasm  Stiffness of left hip, not elsewhere classified  Stiffness of right hip, not elsewhere classified     Problem List Patient Active Problem List   Diagnosis Date Noted  . Incontinence of feces   . Visual disturbances 02/06/2017  . Essential hypertension 11/07/2016  . Hyperlipidemia 04/24/2015  . Dysuria 02/11/2015  . Acute cystitis without hematuria 02/11/2015  . Chest pain 02/06/2015  . Left inguinal hernia 08/02/2013  . Gait abnormality 05/14/2013  . Gait disorder   . Nystagmus, end-position   . Routine health maintenance 12/13/2011  . LEG CRAMPS, NOCTURNAL 12/07/2010  . CALLUS, TOE 03/19/2010  . hip pain 03/19/2010  . ATAXIA 11/17/2008  . EMPHYSEMA 10/21/2008  . CERVICAL RADICULOPATHY, RIGHT 10/21/2008  . DIVERTICULOSIS OF COLON 08/14/2008  . RENAL CYST 08/14/2008  . ANEMIA-IRON DEFICIENCY 06/16/2007  . Asthma 06/16/2007  . GERD 06/16/2007  . OSTEOPENIA 06/16/2007    Zannie Cove, PT 01/09/2018, 3:57 PM  [facility] [address], [place], [place], [address] Phone: [phone]   Fax:  [phone]  Name: DARL BRISBIN MRN: [id number] Date of Birth: [birth year]

## 2018-01-16 ENCOUNTER — Ambulatory Visit: Payer: Medicare Other | Admitting: Physical Therapy

## 2018-01-16 DIAGNOSIS — M25652 Stiffness of left hip, not elsewhere classified: Secondary | ICD-10-CM | POA: Diagnosis not present

## 2018-01-16 DIAGNOSIS — M6281 Muscle weakness (generalized): Secondary | ICD-10-CM

## 2018-01-16 DIAGNOSIS — R252 Cramp and spasm: Secondary | ICD-10-CM | POA: Diagnosis not present

## 2018-01-16 DIAGNOSIS — M25651 Stiffness of right hip, not elsewhere classified: Secondary | ICD-10-CM | POA: Diagnosis not present

## 2018-01-16 NOTE — Patient Instructions (Signed)
   Transverse Abdominus Activation  Contract your lower abdominals and squeeze anus while you lift one leg from the table.  Initiate the movement but do no lift foot greater than 1 inch from the table.  Repeat opposite side. 20x      Rows with Theraband  Standing with feet hip width apart, stomach drawn in and glute muscles tight. Start with arms extended in front of you. Pull shoulder blades down and back, push chest out, and then pull elbows in towards body. Slowly return to starting position.  20x/day   TRUNK EXTENSION - TOWEL - AROM - MOBILIZATION  While sitting in a chair, extend your thoracic spine backwards over a rolled up towel against the back rest.  Repeat 10x/day      Supine Pecs Stretch + thoracic extension  Lying with a tightly rolled up towel under your trunk (just above the elbows), bring arms directly overhead into a Y position, or with hands clasped behind the head.   You should feel a little extension stretch in the mid upper back, as well as a chest stretch almost near the armpits.   Gradually increase tolerance to prolonged time in position (2-5 minutes) as it can create some short term soreness when it is first started.    [facility] [address], [place], [place] [address] Phone # [phone] Fax [phone]

## 2018-01-16 NOTE — Therapy (Signed)
[facility] [address], [place], [place], [address] Phone: [phone]   Fax:  [phone]  Physical Therapy Treatment  Patient Details  Name: Kristine Francis MRN: [id number] Date of Birth: 10-13-1941 Referring Provider: Mauri Pole, MD   Encounter Date: 01/16/2018  PT End of Session - 01/16/18 1600    Visit Number  3    Date for PT Re-Evaluation  02/27/18    PT Start Time  3976    PT Stop Time  1610    PT Time Calculation (min)  40 min    Activity Tolerance  Patient tolerated treatment well    Behavior During Therapy  [facility] for tasks assessed/performed       Past Medical History:  Diagnosis Date  . Allergy   . Asthma    sees Dr. Tiajuana Amass   . Ataxia   . Diverticulosis   . Emphysema   . Gait abnormality 05/14/2013   Kristine Francis a patient of Dr. Linda Hedges presented first interpreter thousand 13 with a gait dysfunction, she also had an episodic confusion and Loss device. Exam found a mild nystagmus and she was referred to ophthalmology on 03-2812, brain MRI was normal nystagmus was a secondary diagnosis to vestibulitis, ear nose and throat has followed and had seen the patient. At the time of the nystagmus the patie  . GERD (gastroesophageal reflux disease)   . Hepatic steatosis   . Iron deficiency anemia, unspecified   . Kidney cysts 11/16/12   small cyst on left  . Nystagmus, end-position   . Osteopenia   . Renal cyst     Past Surgical History:  Procedure Laterality Date  . ANAL RECTAL MANOMETRY N/A 12/20/2017   Procedure: ANO RECTAL MANOMETRY;  Surgeon: Mauri Pole, MD;  Location: [facility];  Service: Endoscopy;  Laterality: N/A;  . BREAST BIOPSY Bilateral 1970's  . CATARACT EXTRACTION Bilateral LT:02/14/13,RT:02/21/13  . CERVICAL SPINE SURGERY  2010  . COLONOSCOPY  03-12-14   per Dr. Olevia Perches, diverticulosis only, repeat 5 yrs   . EYE SURGERY Bilateral 01/2013&02/2013   left then right  .  HERNIA REPAIR  Oct '14-left, remote-right   years ago right; left done '14  . SKIN CANCER EXCISION  05/13/13   FACE, basal cell  . TONSILLECTOMY      There were no vitals filed for this visit.  Subjective Assessment - 01/16/18 1531    Subjective  Patient reports no new changes.  States she usually has urinary incontinence and not fecal.  The only time she has fecal incontinence is when she bends down and has a lot of pressure on the abdomen.    Patient Stated Goals  walk straighter and strengthen pelvic    Currently in Pain?  No/denies                      [facility] Adult PT Treatment/Exercise - 01/16/18 0001      Standardized Balance Assessment   Standardized Balance Assessment  Timed Up and Go Test      Timed Up and Go Test   TUG  Normal TUG    Normal TUG (seconds)  -- 22, 19, 17      Lumbar Exercises: Standing   Row  Strengthening;Both;20 reps;Theraband cues for thoracic extension and posture    Theraband Level (Row)  Level 1 (Yellow)    Shoulder Extension  Strengthening;Both;20 reps;Theraband cues for thoracic extension and posture    Theraband Level (  Shoulder Extension)  Level 1 (Yellow)      Knee/Hip Exercises: Supine   Other Supine Knee/Hip Exercises  ball squeeze with pelvic floor contraction - hold 3 sec, relax 3 sec      Shoulder Exercises: Supine   Horizontal ABduction  Strengthening;Both;20 reps;Theraband    Theraband Level (Shoulder Horizontal ABduction)  Level 1 (Yellow) with towel roll for thoracic ext    External Rotation  Strengthening;Both;20 reps;Theraband    Theraband Level (Shoulder External Rotation)  Level 1 (Yellow) with towel roll for thoracic ext    Other Supine Exercises  pec stretch in supine with towel roll for Tspine ext - 1 min             PT Education - 01/16/18 1721    Education provided  Yes    Education Details  HEP    Person(s) Educated  Patient    Methods  Explanation;Demonstration;Tactile cues;Verbal cues;Handout     Comprehension  Verbalized understanding;Returned demonstration       PT Short Term Goals - 01/16/18 1732      PT SHORT TERM GOAL #1   Title  pt will be independent with initial HEP    Time  4    Period  Weeks    Status  Achieved      PT SHORT TERM GOAL #2   Title  pt will be able to contract pelvic floor and hold contraction when coughing    Time  4    Period  Weeks    Status  On-going      PT SHORT TERM GOAL #3   Title  Pt will perform TUG test and appropriate goal created for balance    Time  4    Period  Weeks    Status  Achieved      PT SHORT TERM GOAL #4   Title  pt will be able to stand on single leg for 2 seconds without assistance to prevent fall    Time  4    Period  Weeks    Status  On-going        PT Long Term Goals - 01/16/18 1733      PT LONG TERM GOAL #5   Title  TUG < or = to 13 sec due to improved strength and stability for reduced risk of falls    Time  8    Period  Weeks    Status  New            Plan - 01/16/18 4098    Clinical Impression Statement  Pt did well with exercises today and needs regular verbal and tactile cues for improved thoracic extension.  Pt demonstrates good thoracic mobility when performing exercises in supine, but lacks strength to maintain posture in seated and standing.  Pt will benefit from skilled PT to continue working on posture and strength for improved intra-abdominal pressure and improved pelvic floor function for greater bowel and bladder control.    PT Treatment/Interventions  ADLs/Self Care Home Management;Biofeedback;Canalith Repostioning;Cryotherapy;Electrical Stimulation;Moist Heat;Balance training;Therapeutic exercise;Therapeutic activities;Stair training;Gait training;Neuromuscular re-education;Patient/family education;Manual techniques;Passive range of motion;Dry needling;Taping    PT Next Visit Plan  progress pelvic and core strength as able, lumbar ROM, hip flexor stretch, balance, biofeedback for pelvic  floor EMG    Consulted and Agree with Plan of Care  Patient       Patient will benefit from skilled therapeutic intervention in order to improve the following deficits and impairments:  Abnormal gait, Impaired  tone, Decreased strength, Decreased range of motion, Difficulty walking  Visit Diagnosis: Muscle weakness (generalized)  Cramp and spasm  Stiffness of left hip, not elsewhere classified  Stiffness of right hip, not elsewhere classified     Problem List Patient Active Problem List   Diagnosis Date Noted  . Incontinence of feces   . Visual disturbances 02/06/2017  . Essential hypertension 11/07/2016  . Hyperlipidemia 04/24/2015  . Dysuria 02/11/2015  . Acute cystitis without hematuria 02/11/2015  . Chest pain 02/06/2015  . Left inguinal hernia 08/02/2013  . Gait abnormality 05/14/2013  . Gait disorder   . Nystagmus, end-position   . Routine health maintenance 12/13/2011  . LEG CRAMPS, NOCTURNAL 12/07/2010  . CALLUS, TOE 03/19/2010  . hip pain 03/19/2010  . ATAXIA 11/17/2008  . EMPHYSEMA 10/21/2008  . CERVICAL RADICULOPATHY, RIGHT 10/21/2008  . DIVERTICULOSIS OF COLON 08/14/2008  . RENAL CYST 08/14/2008  . ANEMIA-IRON DEFICIENCY 06/16/2007  . Asthma 06/16/2007  . GERD 06/16/2007  . OSTEOPENIA 06/16/2007    Zannie Cove, PT 01/16/2018, 5:33 PM  [facility] [address] [place], [place], [address] Phone: [phone]   Fax:  [phone]  Name: RONAE NOELL MRN: [id number] Date of Birth: 1941/03/18

## 2018-01-23 ENCOUNTER — Encounter: Payer: Self-pay | Admitting: Physical Therapy

## 2018-01-23 ENCOUNTER — Ambulatory Visit: Payer: Medicare Other | Attending: Gastroenterology | Admitting: Physical Therapy

## 2018-01-23 DIAGNOSIS — R252 Cramp and spasm: Secondary | ICD-10-CM | POA: Insufficient documentation

## 2018-01-23 DIAGNOSIS — M25651 Stiffness of right hip, not elsewhere classified: Secondary | ICD-10-CM | POA: Diagnosis not present

## 2018-01-23 DIAGNOSIS — M6281 Muscle weakness (generalized): Secondary | ICD-10-CM | POA: Insufficient documentation

## 2018-01-23 DIAGNOSIS — J301 Allergic rhinitis due to pollen: Secondary | ICD-10-CM | POA: Diagnosis not present

## 2018-01-23 DIAGNOSIS — M25652 Stiffness of left hip, not elsewhere classified: Secondary | ICD-10-CM

## 2018-01-23 DIAGNOSIS — J3081 Allergic rhinitis due to animal (cat) (dog) hair and dander: Secondary | ICD-10-CM | POA: Diagnosis not present

## 2018-01-23 DIAGNOSIS — J3089 Other allergic rhinitis: Secondary | ICD-10-CM | POA: Diagnosis not present

## 2018-01-23 NOTE — Therapy (Signed)
[facility] [address] [place], [place], [address] Phone: [phone]   Fax:  [phone]  Physical Therapy Treatment  Patient Details  Name: Kristine Francis MRN: [id number] Date of Birth: 11-11-1941 Referring Provider: Mauri Pole, MD   Encounter Date: 01/23/2018  PT End of Session - 01/23/18 1420    Visit Number  4    Date for PT Re-Evaluation  02/27/18    PT Start Time  1416 pt arrived 16 min late    PT Stop Time  1447    PT Time Calculation (min)  31 min    Activity Tolerance  Patient tolerated treatment well    Behavior During Therapy  [facility] for tasks assessed/performed       Past Medical History:  Diagnosis Date  . Allergy   . Asthma    sees Dr. Tiajuana Amass   . Ataxia   . Diverticulosis   . Emphysema   . Gait abnormality 05/14/2013   Ms.Pendelton a patient of Dr. Linda Hedges presented first interpreter thousand 13 with a gait dysfunction, she also had an episodic confusion and Loss device. Exam found a mild nystagmus and she was referred to ophthalmology on 03-2812, brain MRI was normal nystagmus was a secondary diagnosis to vestibulitis, ear nose and throat has followed and had seen the patient. At the time of the nystagmus the patie  . GERD (gastroesophageal reflux disease)   . Hepatic steatosis   . Iron deficiency anemia, unspecified   . Kidney cysts 11/16/12   small cyst on left  . Nystagmus, end-position   . Osteopenia   . Renal cyst     Past Surgical History:  Procedure Laterality Date  . ANAL RECTAL MANOMETRY N/A 12/20/2017   Procedure: ANO RECTAL MANOMETRY;  Surgeon: Mauri Pole, MD;  Location: [facility];  Service: Endoscopy;  Laterality: N/A;  . BREAST BIOPSY Bilateral 1970's  . CATARACT EXTRACTION Bilateral LT:02/14/13,RT:02/21/13  . CERVICAL SPINE SURGERY  2010  . COLONOSCOPY  03-12-14   per Dr. Olevia Perches, diverticulosis only, repeat 5 yrs   . EYE SURGERY Bilateral 01/2013&02/2013   left then right  . HERNIA REPAIR  Oct '14-left, remote-right   years ago right; left done '14  . SKIN CANCER EXCISION  05/13/13   FACE, basal cell  . TONSILLECTOMY      There were no vitals filed for this visit.  Subjective Assessment - 01/23/18 1432    Subjective  Pt states has not had as much fecal leakage this weak as last week.  Urinary leakage is still occuring.      Patient Stated Goals  walk straighter and strengthen pelvic    Currently in Pain?  No/denies                      [facility] Adult PT Treatment/Exercise - 01/23/18 0001      Neuro Re-ed    Neuro Re-ed Details   educated and performed breathing and posture cues throughout      Lumbar Exercises: Seated   Other Seated Lumbar Exercises  thoracic extension with cues for posutre; W with thoracic extension - 10x 3 sec hold for each      Knee/Hip Exercises: Supine   Other Supine Knee/Hip Exercises  pelvic floor and TrA contraction in supine with tactile feedback; cues for diaphragmatic breathing      Shoulder Exercises: Supine   Other Supine Exercises  towel roll for thoracic extension plus 2 pillows  for supporting head thoughout all supine exercises             PT Education - 01/23/18 1457    Education provided  Yes    Education Details  balloon breathing    Person(s) Educated  Patient    Methods  Explanation;Demonstration;Tactile cues;Verbal cues;Handout    Comprehension  Verbalized understanding;Returned demonstration       PT Short Term Goals - 01/16/18 1732      PT SHORT TERM GOAL #1   Title  pt will be independent with initial HEP    Time  4    Period  Weeks    Status  Achieved      PT SHORT TERM GOAL #2   Title  pt will be able to contract pelvic floor and hold contraction when coughing    Time  4    Period  Weeks    Status  On-going      PT SHORT TERM GOAL #3   Title  Pt will perform TUG test and appropriate goal created for balance    Time  4    Period  Weeks    Status   Achieved      PT SHORT TERM GOAL #4   Title  pt will be able to stand on single leg for 2 seconds without assistance to prevent fall    Time  4    Period  Weeks    Status  On-going        PT Long Term Goals - 01/16/18 1733      PT LONG TERM GOAL #5   Title  TUG < or = to 13 sec due to improved strength and stability for reduced risk of falls    Time  8    Period  Weeks    Status  New            Plan - 01/23/18 1458    Clinical Impression Statement  Patient demonstrates some thoracic extension and is able to breath more deeply while performing extension exercises.  She reports her stool consistency has been improved over the last week.  Pt needed max cues to perform diaphragmatic breathing today.  She will continue to need skilled PT to work on muscle coordination and posture with pelvic floor strengthening.    Clinical Impairments Affecting Rehab Potential  gait disturbance, history includes hernia repair, vaginal deliveries with tearing    PT Treatment/Interventions  ADLs/Self Care Home Management;Biofeedback;Canalith Repostioning;Cryotherapy;Electrical Stimulation;Moist Heat;Balance training;Therapeutic exercise;Therapeutic activities;Stair training;Gait training;Neuromuscular re-education;Patient/family education;Manual techniques;Passive range of motion;Dry needling;Taping    PT Next Visit Plan  progress pelvic and core strength as able, lumbar ROM, hip flexor stretch, balance, biofeedback for pelvic floor EMG    Consulted and Agree with Plan of Care  Patient       Patient will benefit from skilled therapeutic intervention in order to improve the following deficits and impairments:  Abnormal gait, Impaired tone, Decreased strength, Decreased range of motion, Difficulty walking  Visit Diagnosis: Muscle weakness (generalized)  Cramp and spasm  Stiffness of left hip, not elsewhere classified  Stiffness of right hip, not elsewhere classified     Problem List Patient  Active Problem List   Diagnosis Date Noted  . Incontinence of feces   . Visual disturbances 02/06/2017  . Essential hypertension 11/07/2016  . Hyperlipidemia 04/24/2015  . Dysuria 02/11/2015  . Acute cystitis without hematuria 02/11/2015  . Chest pain 02/06/2015  . Left inguinal hernia 08/02/2013  . Gait  abnormality 05/14/2013  . Gait disorder   . Nystagmus, end-position   . Routine health maintenance 12/13/2011  . LEG CRAMPS, NOCTURNAL 12/07/2010  . CALLUS, TOE 03/19/2010  . hip pain 03/19/2010  . ATAXIA 11/17/2008  . EMPHYSEMA 10/21/2008  . CERVICAL RADICULOPATHY, RIGHT 10/21/2008  . DIVERTICULOSIS OF COLON 08/14/2008  . RENAL CYST 08/14/2008  . ANEMIA-IRON DEFICIENCY 06/16/2007  . Asthma 06/16/2007  . GERD 06/16/2007  . OSTEOPENIA 06/16/2007    Zannie Cove, PT 01/23/2018, 4:54 PM  [facility] [address] [place], [place], [address] Phone: [phone]   Fax:  [phone]  Name: Kristine Francis MRN: [id number] Date of Birth: [birth year]

## 2018-01-23 NOTE — Patient Instructions (Signed)
Balloon Breath    Place hands LIGHTLY on belly below navel. Imagine a balloon inside belly. Blow up balloon on breath IN, deflate balloon on breath OUT. Contract abdominals slightly to assist breath OUT. Time _3-5__ minutes. Daily  Copyright  VHI. All rights reserved.      [facility] [address], [place], [place] [address] Phone # [phone] Fax [phone]

## 2018-01-30 ENCOUNTER — Ambulatory Visit: Payer: Medicare Other | Admitting: Physical Therapy

## 2018-01-30 DIAGNOSIS — J301 Allergic rhinitis due to pollen: Secondary | ICD-10-CM | POA: Diagnosis not present

## 2018-01-30 DIAGNOSIS — J3081 Allergic rhinitis due to animal (cat) (dog) hair and dander: Secondary | ICD-10-CM | POA: Diagnosis not present

## 2018-01-30 DIAGNOSIS — R252 Cramp and spasm: Secondary | ICD-10-CM

## 2018-01-30 DIAGNOSIS — J3089 Other allergic rhinitis: Secondary | ICD-10-CM | POA: Diagnosis not present

## 2018-01-30 DIAGNOSIS — M25651 Stiffness of right hip, not elsewhere classified: Secondary | ICD-10-CM | POA: Diagnosis not present

## 2018-01-30 DIAGNOSIS — M6281 Muscle weakness (generalized): Secondary | ICD-10-CM

## 2018-01-30 DIAGNOSIS — M25652 Stiffness of left hip, not elsewhere classified: Secondary | ICD-10-CM | POA: Diagnosis not present

## 2018-01-30 NOTE — Therapy (Signed)
[facility] [address] [place], [place], [address] Phone: [phone]   Fax:  [phone]  Physical Therapy Treatment  Patient Details  Name: Kristine Francis MRN: [id number] Date of Birth: Jan 25, 1941 Referring Provider: Mauri Pole, MD   Encounter Date: 01/30/2018  PT End of Session - 01/30/18 1408    Visit Number  5    Date for PT Re-Evaluation  02/27/18    PT Start Time  1400    PT Stop Time  1441    PT Time Calculation (min)  41 min    Activity Tolerance  Patient tolerated treatment well    Behavior During Therapy  [facility] for tasks assessed/performed       Past Medical History:  Diagnosis Date  . Allergy   . Asthma    sees Dr. Tiajuana Amass   . Ataxia   . Diverticulosis   . Emphysema   . Gait abnormality 05/14/2013   Ms.Geving a patient of Dr. Linda Hedges presented first interpreter thousand 13 with a gait dysfunction, she also had an episodic confusion and Loss device. Exam found a mild nystagmus and she was referred to ophthalmology on 03-2812, brain MRI was normal nystagmus was a secondary diagnosis to vestibulitis, ear nose and throat has followed and had seen the patient. At the time of the nystagmus the patie  . GERD (gastroesophageal reflux disease)   . Hepatic steatosis   . Iron deficiency anemia, unspecified   . Kidney cysts 11/16/12   small cyst on left  . Nystagmus, end-position   . Osteopenia   . Renal cyst     Past Surgical History:  Procedure Laterality Date  . ANAL RECTAL MANOMETRY N/A 12/20/2017   Procedure: ANO RECTAL MANOMETRY;  Surgeon: Mauri Pole, MD;  Location: [facility];  Service: Endoscopy;  Laterality: N/A;  . BREAST BIOPSY Bilateral 1970's  . CATARACT EXTRACTION Bilateral LT:02/14/13,RT:02/21/13  . CERVICAL SPINE SURGERY  2010  . COLONOSCOPY  03-12-14   per Dr. Olevia Perches, diverticulosis only, repeat 5 yrs   . EYE SURGERY Bilateral 01/2013&02/2013   left then right  .  HERNIA REPAIR  Oct '14-left, remote-right   years ago right; left done '14  . SKIN CANCER EXCISION  05/13/13   FACE, basal cell  . TONSILLECTOMY      There were no vitals filed for this visit.  Subjective Assessment - 01/30/18 1409    Subjective  Pt states she has not had any leakage of bowels this last week.  States she had some soft stools from eating fruit and has had some black colored stools.    Patient Stated Goals  walk straighter and strengthen pelvic floor    Currently in Pain?  No/denies                      [facility] Adult PT Treatment/Exercise - 01/30/18 0001      Neuro Re-ed    Neuro Re-ed Details   posture and balance exercises      Lumbar Exercises: Standing   Other Standing Lumbar Exercises  modified tandem close supervision 2 x 30 sec hold, weight shift fwd/back 20x min UE support and close sup,       Lumbar Exercises: Seated   Sit to Stand  20 reps    Other Seated Lumbar Exercises  thoracic extension in sitting with cues for posutre; W with thoracic extension - 10x 3 sec hold for each  Knee/Hip Exercises: Stretches   Other Knee/Hip Stretches  pelvic floor squeeze; knack             PT Education - 01/30/18 1450    Education provided  Yes    Education Details  balance weight shift heel to toe, piriformis stretch    Person(s) Educated  Patient    Methods  Explanation;Demonstration;Handout    Comprehension  Verbalized understanding;Returned demonstration       PT Short Term Goals - 01/16/18 1732      PT SHORT TERM GOAL #1   Title  pt will be independent with initial HEP    Time  4    Period  Weeks    Status  Achieved      PT SHORT TERM GOAL #2   Title  pt will be able to contract pelvic floor and hold contraction when coughing    Time  4    Period  Weeks    Status  On-going      PT SHORT TERM GOAL #3   Title  Pt will perform TUG test and appropriate goal created for balance    Time  4    Period  Weeks    Status  Achieved       PT SHORT TERM GOAL #4   Title  pt will be able to stand on single leg for 2 seconds without assistance to prevent fall    Time  4    Period  Weeks    Status  On-going        PT Long Term Goals - 01/30/18 1645      PT LONG TERM GOAL #1   Title  pt will be ind with advanced HEP    Time  8    Period  Weeks    Status  On-going      PT LONG TERM GOAL #2   Title  pt will be able to sustain 2/5 contraction for 5 sec to demonstrate improved endurance for bowel control    Time  8    Period  Weeks    Status  On-going      PT LONG TERM GOAL #3   Title  pt will be able to demonstrate hip extension to neutral for improved gait    Time  8    Period  Weeks    Status  On-going      PT LONG TERM GOAL #4   Title  pt will improve FISI score to 21/24    Time  8    Period  Weeks    Status  On-going      PT LONG TERM GOAL #5   Title  TUG < or = to 13 sec due to improved strength and stability for reduced risk of falls    Time  8    Period  Weeks    Status  On-going            Plan - 01/30/18 1451    Clinical Impression Statement  Patient needs cues for standing up tall and nose over toes during sit to stand.  Pt performed exercises correctly and needed min assist with hand support for balance.  She needs close supervision for placing one foot ahead of the other.  Pt was educated on knack and contract before sit to stand and was able to perform with tactile cues.  Pt will benefit from skilled PT to work on balance and muscle coordination  Clinical Impairments Affecting Rehab Potential  gait disturbance, history includes hernia repair, vaginal deliveries with tearing    PT Treatment/Interventions  ADLs/Self Care Home Management;Biofeedback;Canalith Repostioning;Cryotherapy;Electrical Stimulation;Moist Heat;Balance training;Therapeutic exercise;Therapeutic activities;Stair training;Gait training;Neuromuscular re-education;Patient/family education;Manual techniques;Passive range of  motion;Dry needling;Taping    PT Next Visit Plan  progress pelvic and core strength as able, lumbar ROM, hip flexor stretch, balance, biofeedback for pelvic floor EMG for knack and sit to stand    Consulted and Agree with Plan of Care  Patient       Patient will benefit from skilled therapeutic intervention in order to improve the following deficits and impairments:  Abnormal gait, Impaired tone, Decreased strength, Decreased range of motion, Difficulty walking  Visit Diagnosis: Muscle weakness (generalized)  Cramp and spasm  Stiffness of left hip, not elsewhere classified  Stiffness of right hip, not elsewhere classified     Problem List Patient Active Problem List   Diagnosis Date Noted  . Incontinence of feces   . Visual disturbances 02/06/2017  . Essential hypertension 11/07/2016  . Hyperlipidemia 04/24/2015  . Dysuria 02/11/2015  . Acute cystitis without hematuria 02/11/2015  . Chest pain 02/06/2015  . Left inguinal hernia 08/02/2013  . Gait abnormality 05/14/2013  . Gait disorder   . Nystagmus, end-position   . Routine health maintenance 12/13/2011  . LEG CRAMPS, NOCTURNAL 12/07/2010  . CALLUS, TOE 03/19/2010  . hip pain 03/19/2010  . ATAXIA 11/17/2008  . EMPHYSEMA 10/21/2008  . CERVICAL RADICULOPATHY, RIGHT 10/21/2008  . DIVERTICULOSIS OF COLON 08/14/2008  . RENAL CYST 08/14/2008  . ANEMIA-IRON DEFICIENCY 06/16/2007  . Asthma 06/16/2007  . GERD 06/16/2007  . OSTEOPENIA 06/16/2007    Zannie Cove, PT 01/30/2018, 4:53 PM  [facility] [address], [place], [place], [address] Phone: [phone]   Fax:  [phone]  Name: Kristine Francis MRN: [id number] Date of Birth: 02-14-1941

## 2018-01-30 NOTE — Patient Instructions (Signed)
   Heel/Toe [employer] next to a counter.  Shift weight onto the balls of your feet and then rock back onto your heels.  Go back and forth challenging your balance, but not leaning so far back that you lose your balance.  Keep hands on counter while doing this    Piriformis Stretch, Sitting    Sit, one ankle on opposite knee, same-side hand on crossed knee. Push down on knee, keeping spine straight. Lean torso forward, with flat back, until tension is felt in hamstrings and gluteals of crossed-leg side. Hold _30__ seconds.  Repeat _3__ times per session. Do __2_ sessions per day.  Copyright  VHI. All rights reserved.    [facility] [address], [place], [place] [address] Phone # [phone] Fax [phone]

## 2018-02-05 ENCOUNTER — Other Ambulatory Visit: Payer: Self-pay | Admitting: Family Medicine

## 2018-02-06 ENCOUNTER — Encounter: Payer: Self-pay | Admitting: Physical Therapy

## 2018-02-06 ENCOUNTER — Telehealth: Payer: Self-pay

## 2018-02-06 ENCOUNTER — Ambulatory Visit: Payer: Medicare Other | Admitting: Physical Therapy

## 2018-02-06 DIAGNOSIS — M6281 Muscle weakness (generalized): Secondary | ICD-10-CM

## 2018-02-06 DIAGNOSIS — M25652 Stiffness of left hip, not elsewhere classified: Secondary | ICD-10-CM | POA: Diagnosis not present

## 2018-02-06 DIAGNOSIS — M25651 Stiffness of right hip, not elsewhere classified: Secondary | ICD-10-CM

## 2018-02-06 DIAGNOSIS — J3089 Other allergic rhinitis: Secondary | ICD-10-CM | POA: Diagnosis not present

## 2018-02-06 DIAGNOSIS — J301 Allergic rhinitis due to pollen: Secondary | ICD-10-CM | POA: Diagnosis not present

## 2018-02-06 DIAGNOSIS — R252 Cramp and spasm: Secondary | ICD-10-CM

## 2018-02-06 NOTE — Telephone Encounter (Signed)
Fax from [facility]   PA need for Losartan 50 MG tablet   Placed on [employer]

## 2018-02-06 NOTE — Patient Instructions (Signed)
   HIP ABDUCTION - STANDING   While standing, raise your leg out to the side. Keep your knee straight and maintain your toes pointed forward the entire time.   Use your arms for support if needed for balance and safety. Do 20x each side    HIP FLEXION - STANDING - SLR  While standing on one leg, lift your other leg forward with a straight knee as shown. Return to starting position and repeat.  Use your arms for support if needed for balance and safety.   [facility] [address] [place], [place] [address] Phone # [phone] Fax [phone]

## 2018-02-06 NOTE — Therapy (Signed)
[facility] [address], [place], [place], [address] Phone: [phone]   Fax:  [phone]  Physical Therapy Treatment  Patient Details  Name: Kristine Francis MRN: [id number] Date of Birth: Apr 16, 1941 Referring Provider: Mauri Pole, MD   Encounter Date: 02/06/2018  PT End of Session - 02/06/18 1414    Visit Number  6    Date for PT Re-Evaluation  02/27/18    PT Start Time  1406    PT Stop Time  1444    PT Time Calculation (min)  38 min    Activity Tolerance  Patient tolerated treatment well    Behavior During Therapy  [facility] for tasks assessed/performed       Past Medical History:  Diagnosis Date  . Allergy   . Asthma    sees Dr. Tiajuana Amass   . Ataxia   . Diverticulosis   . Emphysema   . Gait abnormality 05/14/2013   Ms.Happel a patient of Dr. Linda Hedges presented first interpreter thousand 13 with a gait dysfunction, she also had an episodic confusion and Loss device. Exam found a mild nystagmus and she was referred to ophthalmology on 03-2812, brain MRI was normal nystagmus was a secondary diagnosis to vestibulitis, ear nose and throat has followed and had seen the patient. At the time of the nystagmus the patie  . GERD (gastroesophageal reflux disease)   . Hepatic steatosis   . Iron deficiency anemia, unspecified   . Kidney cysts 11/16/12   small cyst on left  . Nystagmus, end-position   . Osteopenia   . Renal cyst     Past Surgical History:  Procedure Laterality Date  . ANAL RECTAL MANOMETRY N/A 12/20/2017   Procedure: ANO RECTAL MANOMETRY;  Surgeon: Mauri Pole, MD;  [facility]: [facility];  Service: Endoscopy;  Laterality: N/A;  . BREAST BIOPSY Bilateral 1970's  . CATARACT EXTRACTION Bilateral LT:02/14/13,RT:02/21/13  . CERVICAL SPINE SURGERY  2010  . COLONOSCOPY  03-12-14   per Dr. Olevia Perches, diverticulosis only, repeat 5 yrs   . EYE SURGERY Bilateral 01/2013&02/2013   left then right  .  HERNIA REPAIR  Oct '14-left, remote-right   years ago right; left done '14  . SKIN CANCER EXCISION  05/13/13   FACE, basal cell  . TONSILLECTOMY      There were no vitals filed for this visit.  Subjective Assessment - 02/06/18 1413    Subjective  No new complaints, still feeling off balance, no changes    Patient Stated Goals  walk straighter and strengthen pelvic floor    Currently in Pain?  No/denies                      [facility] Adult PT Treatment/Exercise - 02/06/18 0001      Lumbar Exercises: Standing   Other Standing Lumbar Exercises  modified tandem and single leg stand close supervision 2 x 30 sec hold, weight shift fwd/back 20x min UE support and close sup,     Other Standing Lumbar Exercises  hip flexion and abduction at TM bar - 2x 10 each      Lumbar Exercises: Seated   Long Arc Quad on Chair  Strengthening;Right;Left;2 sets;10 reps;Weights    LAQ on Chair Weights (lbs)  2    Sit to Stand  20 reps      Knee/Hip Exercises: Aerobic   Nustep  L2 x 1 min; L1 x 1 min      Knee/Hip  Exercises: Supine   Bridges  Strengthening;10 reps pelvic floor bracing with lifting    Other Supine Knee/Hip Exercises  ball squeeze with pelvic floor contraction - hold 5 sec, relax 3 sec      Shoulder Exercises: Seated   Row  Strengthening;Both;20 reps;Theraband red             PT Education - 02/06/18 1449    Education provided  Yes    Education Details  standing hip flex and abduction    Person(s) Educated  Patient    Methods  Explanation;Demonstration;Handout;Verbal cues    Comprehension  Verbalized understanding;Returned demonstration       PT Short Term Goals - 01/16/18 1732      PT SHORT TERM GOAL #1   Title  pt will be independent with initial HEP    Time  4    Period  Weeks    Status  Achieved      PT SHORT TERM GOAL #2   Title  pt will be able to contract pelvic floor and hold contraction when coughing    Time  4    Period  Weeks    Status   On-going      PT SHORT TERM GOAL #3   Title  Pt will perform TUG test and appropriate goal created for balance    Time  4    Period  Weeks    Status  Achieved      PT SHORT TERM GOAL #4   Title  pt will be able to stand on single leg for 2 seconds without assistance to prevent fall    Time  4    Period  Weeks    Status  On-going        PT Long Term Goals - 01/30/18 1645      PT LONG TERM GOAL #1   Title  pt will be ind with advanced HEP    Time  8    Period  Weeks    Status  On-going      PT LONG TERM GOAL #2   Title  pt will be able to sustain 2/5 contraction for 5 sec to demonstrate improved endurance for bowel control    Time  8    Period  Weeks    Status  On-going      PT LONG TERM GOAL #3   Title  pt will be able to demonstrate hip extension to neutral for improved gait    Time  8    Period  Weeks    Status  On-going      PT LONG TERM GOAL #4   Title  pt will improve FISI score to 21/24    Time  8    Period  Weeks    Status  On-going      PT LONG TERM GOAL #5   Title  TUG < or = to 13 sec due to improved strength and stability for reduced risk of falls    Time  8    Period  Weeks    Status  On-going            Plan - 02/06/18 1529    Clinical Impression Statement  Patient did well with pelvic floor contraction and able to hold for 5 seconds at a time using tactile to facilite muscle contraction.  Pt tolerated new standing exercises well and is able to add to HEP.  Pt needs close supervision for single leg  standing.  She felt more steady after today's session.  Pt will benefit from skilled PT to continue working on strength and balance in order to achieve functional goals.    Rehab Potential  Good    PT Treatment/Interventions  ADLs/Self Care Home Management;Biofeedback;Canalith Repostioning;Cryotherapy;Electrical Stimulation;Moist Heat;Balance training;Therapeutic exercise;Therapeutic activities;Stair training;Gait training;Neuromuscular  re-education;Patient/family education;Manual techniques;Passive range of motion;Dry needling;Taping    PT Next Visit Plan  progress pelvic and core strength as able, lumbar ROM, hip flexor stretch, balance, biofeedback for pelvic floor EMG for knack and sit to stand    Consulted and Agree with Plan of Care  Patient       Patient will benefit from skilled therapeutic intervention in order to improve the following deficits and impairments:  Abnormal gait, Impaired tone, Decreased strength, Decreased range of motion, Difficulty walking  Visit Diagnosis: Muscle weakness (generalized)  Cramp and spasm  Stiffness of left hip, not elsewhere classified  Stiffness of right hip, not elsewhere classified     Problem List Patient Active Problem List   Diagnosis Date Noted  . Incontinence of feces   . Visual disturbances 02/06/2017  . Essential hypertension 11/07/2016  . Hyperlipidemia 04/24/2015  . Dysuria 02/11/2015  . Acute cystitis without hematuria 02/11/2015  . Chest pain 02/06/2015  . Left inguinal hernia 08/02/2013  . Gait abnormality 05/14/2013  . Gait disorder   . Nystagmus, end-position   . Routine health maintenance 12/13/2011  . LEG CRAMPS, NOCTURNAL 12/07/2010  . CALLUS, TOE 03/19/2010  . hip pain 03/19/2010  . ATAXIA 11/17/2008  . EMPHYSEMA 10/21/2008  . CERVICAL RADICULOPATHY, RIGHT 10/21/2008  . DIVERTICULOSIS OF COLON 08/14/2008  . RENAL CYST 08/14/2008  . ANEMIA-IRON DEFICIENCY 06/16/2007  . Asthma 06/16/2007  . GERD 06/16/2007  . OSTEOPENIA 06/16/2007    Zannie Cove, PT 02/06/2018, 4:30 PM  [facility] [address], [place], [place], [address] Phone: [phone]   Fax:  [phone]  Name: Kristine Francis MRN: [id number] Date of Birth: 16-Apr-1941

## 2018-02-08 NOTE — Telephone Encounter (Signed)
Prior auth sent to [email or web address].  Note states the Rx is covered and no prior Kristine Francis is needed.  I called [facility] and spoke Dorian who stated no PA was needed as the medication was on back order but they now have this in supply.

## 2018-02-09 DIAGNOSIS — J3089 Other allergic rhinitis: Secondary | ICD-10-CM | POA: Diagnosis not present

## 2018-02-09 DIAGNOSIS — J301 Allergic rhinitis due to pollen: Secondary | ICD-10-CM | POA: Diagnosis not present

## 2018-02-13 ENCOUNTER — Ambulatory Visit: Payer: Medicare Other | Admitting: Physical Therapy

## 2018-02-13 ENCOUNTER — Encounter: Payer: Self-pay | Admitting: Physical Therapy

## 2018-02-13 DIAGNOSIS — M25651 Stiffness of right hip, not elsewhere classified: Secondary | ICD-10-CM

## 2018-02-13 DIAGNOSIS — J3089 Other allergic rhinitis: Secondary | ICD-10-CM | POA: Diagnosis not present

## 2018-02-13 DIAGNOSIS — R252 Cramp and spasm: Secondary | ICD-10-CM

## 2018-02-13 DIAGNOSIS — J3081 Allergic rhinitis due to animal (cat) (dog) hair and dander: Secondary | ICD-10-CM | POA: Diagnosis not present

## 2018-02-13 DIAGNOSIS — M25652 Stiffness of left hip, not elsewhere classified: Secondary | ICD-10-CM

## 2018-02-13 DIAGNOSIS — M6281 Muscle weakness (generalized): Secondary | ICD-10-CM

## 2018-02-13 DIAGNOSIS — J301 Allergic rhinitis due to pollen: Secondary | ICD-10-CM | POA: Diagnosis not present

## 2018-02-13 NOTE — Therapy (Signed)
[facility] [address] [place], [place], [address] Phone: [phone]   Fax:  [phone]  Physical Therapy Treatment  Patient Details  Name: Kristine Francis MRN: [id number] Date of Birth: [birth year] Referring Provider: Mauri Pole, MD   Encounter Date: 02/13/2018  PT End of Session - 02/13/18 1402    Visit Number  7    Date for PT Re-Evaluation  02/27/18    PT Start Time  2751    PT Stop Time  1443    PT Time Calculation (min)  41 min    Activity Tolerance  Patient tolerated treatment well    Behavior During Therapy  [facility] for tasks assessed/performed       Past Medical History:  Diagnosis Date  . Allergy   . Asthma    sees Dr. Tiajuana Amass   . Ataxia   . Diverticulosis   . Emphysema   . Gait abnormality 05/14/2013   Ms.Napierkowski a patient of Dr. Linda Hedges presented first interpreter thousand 13 with a gait dysfunction, she also had an episodic confusion and Loss device. Exam found a mild nystagmus and she was referred to ophthalmology on 03-2812, brain MRI was normal nystagmus was a secondary diagnosis to vestibulitis, ear nose and throat has followed and had seen the patient. At the time of the nystagmus the patie  . GERD (gastroesophageal reflux disease)   . Hepatic steatosis   . Iron deficiency anemia, unspecified   . Kidney cysts 11/16/12   small cyst on left  . Nystagmus, end-position   . Osteopenia   . Renal cyst     Past Surgical History:  Procedure Laterality Date  . ANAL RECTAL MANOMETRY N/A 12/20/2017   Procedure: ANO RECTAL MANOMETRY;  Surgeon: Mauri Pole, MD;  [facility]: [facility];  Service: Endoscopy;  Laterality: N/A;  . BREAST BIOPSY Bilateral 1970's  . CATARACT EXTRACTION Bilateral LT:02/14/13,RT:02/21/13  . CERVICAL SPINE SURGERY  2010  . COLONOSCOPY  03-12-14   per Dr. Olevia Perches, diverticulosis only, repeat 5 yrs   . EYE SURGERY Bilateral 01/2013&02/2013   left then right  .  HERNIA REPAIR  Oct '14-left, remote-right   years ago right; left done '14  . SKIN CANCER EXCISION  05/13/13   FACE, basal cell  . TONSILLECTOMY      There were no vitals filed for this visit.  Subjective Assessment - 02/13/18 1437    Subjective  Patient has not noticed any leakage recently.  She reports her balance is still feeling off.    Patient Stated Goals  walk straighter and strengthen pelvic floor    Currently in Pain?  No/denies                No data recorded       [facility] Adult PT Treatment/Exercise - 02/13/18 0001      Neuro Re-ed    Neuro Re-ed Details   tactile cues for "suctioning" pelvic floor, cues to not bear down with pelvic floor contraction; sitting on ball for balance and pelvic floor contraction with tacitle feedback      Lumbar Exercises: Seated   Hip Flexion on Ball  10 reps balance    Sit to Stand  20 reps with pelvic floor contaction on standing               PT Short Term Goals - 01/16/18 1732      PT SHORT TERM GOAL #1   Title  pt will  be independent with initial HEP    Time  4    Period  Weeks    Status  Achieved      PT SHORT TERM GOAL #2   Title  pt will be able to contract pelvic floor and hold contraction when coughing    Time  4    Period  Weeks    Status  On-going      PT SHORT TERM GOAL #3   Title  Pt will perform TUG test and appropriate goal created for balance    Time  4    Period  Weeks    Status  Achieved      PT SHORT TERM GOAL #4   Title  pt will be able to stand on single leg for 2 seconds without assistance to prevent fall    Time  4    Period  Weeks    Status  On-going        PT Long Term Goals - 02/13/18 1438      PT LONG TERM GOAL #1   Title  pt will be ind with advanced HEP    Time  8    Period  Weeks    Status  On-going      PT LONG TERM GOAL #2   Title  pt will be able to sustain 2/5 contraction for 5 sec to demonstrate improved endurance for bowel control    Baseline  able to  hold 5 seconds with external cues    Time  8    Period  Weeks    Status  On-going      PT LONG TERM GOAL #3   Title  pt will be able to demonstrate hip extension to neutral for improved gait    Time  8    Period  Weeks    Status  On-going            Plan - 02/13/18 1439    Clinical Impression Statement  Patient responded well to cues today.  She is able to consistantly hold contraction for 5 seconds and lifts pelvic floor without bearing down.  Pt had difficulty sitting on ball and needs min assist from PT to stay steady.  She will benefit from skilled PT to continue working on balance and muscle coordination for maximum function.    Rehab Potential  Good    Clinical Impairments Affecting Rehab Potential  gait disturbance, history includes hernia repair, vaginal deliveries with tearing    PT Treatment/Interventions  ADLs/Self Care Home Management;Biofeedback;Canalith Repostioning;Cryotherapy;Electrical Stimulation;Moist Heat;Balance training;Therapeutic exercise;Therapeutic activities;Stair training;Gait training;Neuromuscular re-education;Patient/family education;Manual techniques;Passive range of motion;Dry needling;Taping    PT Next Visit Plan  biofeedback, core and pelvic floor strength, hip extension, balance    Consulted and Agree with Plan of Care  Patient       Patient will benefit from skilled therapeutic intervention in order to improve the following deficits and impairments:  Abnormal gait, Impaired tone, Decreased strength, Decreased range of motion, Difficulty walking  Visit Diagnosis: Muscle weakness (generalized)  Cramp and spasm  Stiffness of left hip, not elsewhere classified  Stiffness of right hip, not elsewhere classified     Problem List Patient Active Problem List   Diagnosis Date Noted  . Incontinence of feces   . Visual disturbances 02/06/2017  . Essential hypertension 11/07/2016  . Hyperlipidemia 04/24/2015  . Dysuria 02/11/2015  . Acute  cystitis without hematuria 02/11/2015  . Chest pain 02/06/2015  . Left inguinal hernia  08/02/2013  . Gait abnormality 05/14/2013  . Gait disorder   . Nystagmus, end-position   . Routine health maintenance 12/13/2011  . LEG CRAMPS, NOCTURNAL 12/07/2010  . CALLUS, TOE 03/19/2010  . hip pain 03/19/2010  . ATAXIA 11/17/2008  . EMPHYSEMA 10/21/2008  . CERVICAL RADICULOPATHY, RIGHT 10/21/2008  . DIVERTICULOSIS OF COLON 08/14/2008  . RENAL CYST 08/14/2008  . ANEMIA-IRON DEFICIENCY 06/16/2007  . Asthma 06/16/2007  . GERD 06/16/2007  . OSTEOPENIA 06/16/2007    Kristine Francis, PT 02/13/2018, 3:11 PM  [facility] [address] [place], [place], [address] Phone: [phone]   Fax:  [phone]  Name: Kristine Francis MRN: [id number] Date of Birth: 1941-09-13

## 2018-02-20 ENCOUNTER — Encounter: Payer: Medicare Other | Admitting: Physical Therapy

## 2018-02-20 DIAGNOSIS — Z8673 Personal history of transient ischemic attack (TIA), and cerebral infarction without residual deficits: Secondary | ICD-10-CM | POA: Diagnosis not present

## 2018-02-20 DIAGNOSIS — Z79899 Other long term (current) drug therapy: Secondary | ICD-10-CM | POA: Diagnosis not present

## 2018-02-20 DIAGNOSIS — R270 Ataxia, unspecified: Secondary | ICD-10-CM | POA: Diagnosis not present

## 2018-02-20 DIAGNOSIS — R9389 Abnormal findings on diagnostic imaging of other specified body structures: Secondary | ICD-10-CM | POA: Diagnosis not present

## 2018-02-20 DIAGNOSIS — H539 Unspecified visual disturbance: Secondary | ICD-10-CM | POA: Diagnosis not present

## 2018-02-20 DIAGNOSIS — G119 Hereditary ataxia, unspecified: Secondary | ICD-10-CM | POA: Diagnosis not present

## 2018-02-20 DIAGNOSIS — G629 Polyneuropathy, unspecified: Secondary | ICD-10-CM | POA: Diagnosis not present

## 2018-02-20 DIAGNOSIS — G608 Other hereditary and idiopathic neuropathies: Secondary | ICD-10-CM | POA: Diagnosis not present

## 2018-02-20 DIAGNOSIS — I10 Essential (primary) hypertension: Secondary | ICD-10-CM | POA: Diagnosis not present

## 2018-02-20 DIAGNOSIS — J302 Other seasonal allergic rhinitis: Secondary | ICD-10-CM | POA: Diagnosis not present

## 2018-02-23 ENCOUNTER — Ambulatory Visit: Payer: Medicare Other | Attending: Gastroenterology | Admitting: Physical Therapy

## 2018-02-23 DIAGNOSIS — M25651 Stiffness of right hip, not elsewhere classified: Secondary | ICD-10-CM | POA: Insufficient documentation

## 2018-02-23 DIAGNOSIS — R252 Cramp and spasm: Secondary | ICD-10-CM | POA: Diagnosis not present

## 2018-02-23 DIAGNOSIS — M25652 Stiffness of left hip, not elsewhere classified: Secondary | ICD-10-CM | POA: Diagnosis not present

## 2018-02-23 DIAGNOSIS — J301 Allergic rhinitis due to pollen: Secondary | ICD-10-CM | POA: Diagnosis not present

## 2018-02-23 DIAGNOSIS — J3081 Allergic rhinitis due to animal (cat) (dog) hair and dander: Secondary | ICD-10-CM | POA: Diagnosis not present

## 2018-02-23 DIAGNOSIS — M6281 Muscle weakness (generalized): Secondary | ICD-10-CM | POA: Insufficient documentation

## 2018-02-23 DIAGNOSIS — J3089 Other allergic rhinitis: Secondary | ICD-10-CM | POA: Diagnosis not present

## 2018-02-23 NOTE — Therapy (Signed)
[facility] [address] [place], [place], [address] Phone: [phone]   Fax:  [phone]  Physical Therapy Treatment  Patient Details  Name: Kristine Francis MRN: [id number] Date of Birth: 15-Dec-1940 Referring Provider: Mauri Pole, MD   Encounter Date: 02/23/2018  PT End of Session - 02/23/18 1039    Visit Number  8    Date for PT Re-Evaluation  02/27/18    PT Start Time  1030 pt arrived 15 min late    PT Stop Time  1100    PT Time Calculation (min)  30 min    Activity Tolerance  Patient tolerated treatment well    Behavior During Therapy  Kristine Francis for tasks assessed/performed       Past Medical History:  Diagnosis Date  . Allergy   . Asthma    sees Dr. Tiajuana Francis   . Ataxia   . Diverticulosis   . Emphysema   . Gait abnormality 05/14/2013   Ms.Kristine Francis a patient of Dr. Linda Francis presented first interpreter thousand 13 with a gait dysfunction, she also had an episodic confusion and Loss device. Exam found a mild nystagmus and she was referred to ophthalmology on 03-2812, brain MRI was normal nystagmus was a secondary diagnosis to vestibulitis, ear nose and throat has followed and had seen the patient. At the time of the nystagmus the patie  . GERD (gastroesophageal reflux disease)   . Hepatic steatosis   . Iron deficiency anemia, unspecified   . Kidney cysts 11/16/12   small cyst on left  . Nystagmus, end-position   . Osteopenia   . Renal cyst     Past Surgical History:  Procedure Laterality Date  . ANAL RECTAL MANOMETRY N/A 12/20/2017   Procedure: ANO RECTAL MANOMETRY;  Surgeon: Kristine Pole, MD;  Location: [facility];  Service: Endoscopy;  Laterality: N/A;  . BREAST BIOPSY Bilateral 1970's  . CATARACT EXTRACTION Bilateral LT:02/14/13,RT:02/21/13  . CERVICAL SPINE SURGERY  2010  . COLONOSCOPY  03-12-14   per Dr. Olevia Francis, diverticulosis only, repeat 5 yrs   . EYE SURGERY Bilateral 01/2013&02/2013   left then right  . HERNIA REPAIR  Oct '14-left, remote-right   years ago right; left done '14  . SKIN CANCER EXCISION  05/13/13   FACE, basal cell  . TONSILLECTOMY      There were no vitals filed for this visit.  Subjective Assessment - 02/23/18 1034    Subjective  Pt states the leakage is improved.  She does notice some leakage when she coughs in bed at night.    Patient Stated Goals  walk straighter and strengthen pelvic floor    Currently in Pain?  No/denies         [facility] PT Assessment - 02/23/18 0001      Standardized Balance Assessment   Standardized Balance Assessment  Timed Up and Go Test      Timed Up and Go Test   Normal TUG (seconds)  15                   [facility] Adult PT Treatment/Exercise - 02/23/18 0001      Neuro Re-ed    Neuro Re-ed Details   tactile cues for knacking performed correctly 3/6 attempts;      Lumbar Exercises: Standing   Other Standing Lumbar Exercises  standing on foam mat without UE support most of the time, head turns, heel raises, standing on level surface marching no UE, march  with hold 2 sec needs min/CGA from PT to control LOB               PT Short Term Goals - 01/16/18 1732      PT SHORT TERM GOAL #1   Title  pt will be independent with initial HEP    Time  4    Period  Weeks    Status  Achieved      PT SHORT TERM GOAL #2   Title  pt will be able to contract pelvic floor and hold contraction when coughing    Time  4    Period  Weeks    Status  On-going      PT SHORT TERM GOAL #3   Title  Pt will perform TUG test and appropriate goal created for balance    Time  4    Period  Weeks    Status  Achieved      PT SHORT TERM GOAL #4   Title  pt will be able to stand on single leg for 2 seconds without assistance to prevent fall    Time  4    Period  Weeks    Status  On-going        PT Long Term Goals - 02/23/18 1059      PT LONG TERM GOAL #5   Title  TUG < or = to 13 sec due to improved strength and  stability for reduced risk of falls    Baseline  15 sec (02/23/18)    Time  8    Period  Weeks    Status  On-going            Plan - 02/23/18 1105    Clinical Impression Statement  Patient was able to demonstrate ability to hold contraction with cough today for 3/6 attempts.  Pt was able to demonstrate balance on various surfaces.  She needs supervision with balance exercises.  She will benefit from skilled PT to progress balance and ensure successful transition to HEP    PT Treatment/Interventions  ADLs/Self Care Home Management;Biofeedback;Canalith Repostioning;Cryotherapy;Electrical Stimulation;Moist Heat;Balance training;Therapeutic exercise;Therapeutic activities;Stair training;Gait training;Neuromuscular re-education;Patient/family education;Manual techniques;Passive range of motion;Dry needling;Taping    PT Next Visit Plan  review knack, balance exercises, review goals for d/c, FISI score, discharge likely next    Consulted and Agree with Plan of Care  Patient       Patient will benefit from skilled therapeutic intervention in order to improve the following deficits and impairments:  Abnormal gait, Impaired tone, Decreased strength, Decreased range of motion, Difficulty walking  Visit Diagnosis: Muscle weakness (generalized)  Cramp and spasm  Stiffness of left hip, not elsewhere classified  Stiffness of right hip, not elsewhere classified     Problem List Patient Active Problem List   Diagnosis Date Noted  . Incontinence of feces   . Visual disturbances 02/06/2017  . Essential hypertension 11/07/2016  . Hyperlipidemia 04/24/2015  . Dysuria 02/11/2015  . Acute cystitis without hematuria 02/11/2015  . Chest pain 02/06/2015  . Left inguinal hernia 08/02/2013  . Gait abnormality 05/14/2013  . Gait disorder   . Nystagmus, end-position   . Routine health maintenance 12/13/2011  . LEG CRAMPS, NOCTURNAL 12/07/2010  . CALLUS, TOE 03/19/2010  . hip pain 03/19/2010  .  ATAXIA 11/17/2008  . EMPHYSEMA 10/21/2008  . CERVICAL RADICULOPATHY, RIGHT 10/21/2008  . DIVERTICULOSIS OF COLON 08/14/2008  . RENAL CYST 08/14/2008  . ANEMIA-IRON DEFICIENCY 06/16/2007  . Asthma 06/16/2007  .  GERD 06/16/2007  . OSTEOPENIA 06/16/2007    Zannie Cove, PT 02/23/2018, 11:58 AM  [facility] [address], [place], [place], [address] Phone: [phone]   Fax:  [phone]  Name: Kristine Francis MRN: [id number] Date of Birth: [birth year]

## 2018-02-27 ENCOUNTER — Ambulatory Visit: Payer: Medicare Other | Admitting: Physical Therapy

## 2018-02-27 DIAGNOSIS — M25652 Stiffness of left hip, not elsewhere classified: Secondary | ICD-10-CM

## 2018-02-27 DIAGNOSIS — M6281 Muscle weakness (generalized): Secondary | ICD-10-CM

## 2018-02-27 DIAGNOSIS — M25651 Stiffness of right hip, not elsewhere classified: Secondary | ICD-10-CM

## 2018-02-27 DIAGNOSIS — R252 Cramp and spasm: Secondary | ICD-10-CM | POA: Diagnosis not present

## 2018-02-27 DIAGNOSIS — J301 Allergic rhinitis due to pollen: Secondary | ICD-10-CM | POA: Diagnosis not present

## 2018-02-27 DIAGNOSIS — J3081 Allergic rhinitis due to animal (cat) (dog) hair and dander: Secondary | ICD-10-CM | POA: Diagnosis not present

## 2018-02-27 DIAGNOSIS — J3089 Other allergic rhinitis: Secondary | ICD-10-CM | POA: Diagnosis not present

## 2018-02-27 NOTE — Therapy (Signed)
[facility] [address] [place], [place], [address] Phone: [phone]   Fax:  [phone]  Physical Therapy Treatment  Patient Details  Name: Kristine Francis MRN: [id number] Date of Birth: September 26, 1941 Referring Provider: Mauri Pole, MD   Encounter Date: 02/27/2018  PT End of Session - 02/27/18 1431    Visit Number  9    Date for PT Re-Evaluation  02/27/18    PT Start Time  1884    PT Stop Time  1447    PT Time Calculation (min)  40 min    Activity Tolerance  Patient tolerated treatment well    Behavior During Therapy  [facility] for tasks assessed/performed       Past Medical History:  Diagnosis Date  . Allergy   . Asthma    sees Dr. Tiajuana Amass   . Ataxia   . Diverticulosis   . Emphysema   . Gait abnormality 05/14/2013   Ms.Ciano a patient of Dr. Linda Hedges presented first interpreter thousand 13 with a gait dysfunction, she also had an episodic confusion and Loss device. Exam found a mild nystagmus and she was referred to ophthalmology on 03-2812, brain MRI was normal nystagmus was a secondary diagnosis to vestibulitis, ear nose and throat has followed and had seen the patient. At the time of the nystagmus the patie  . GERD (gastroesophageal reflux disease)   . Hepatic steatosis   . Iron deficiency anemia, unspecified   . Kidney cysts 11/16/12   small cyst on left  . Nystagmus, end-position   . Osteopenia   . Renal cyst     Past Surgical History:  Procedure Laterality Date  . ANAL RECTAL MANOMETRY N/A 12/20/2017   Procedure: ANO RECTAL MANOMETRY;  Surgeon: Mauri Pole, MD;  Location: [facility];  Service: Endoscopy;  Laterality: N/A;  . BREAST BIOPSY Bilateral 1970's  . CATARACT EXTRACTION Bilateral LT:02/14/13,RT:02/21/13  . CERVICAL SPINE SURGERY  2010  . COLONOSCOPY  03-12-14   per Dr. Olevia Perches, diverticulosis only, repeat 5 yrs   . EYE SURGERY Bilateral 01/2013&02/2013   left then right  .  HERNIA REPAIR  Oct '14-left, remote-right   years ago right; left done '14  . SKIN CANCER EXCISION  05/13/13   FACE, basal cell  . TONSILLECTOMY      There were no vitals filed for this visit.  Subjective Assessment - 02/27/18 1449    Subjective  I haven't had much leakage only 1x over the last month.  Pt reports feeling much better overall and is able to do exercises.    Currently in Pain?  No/denies                       [facility] Adult PT Treatment/Exercise - 02/27/18 0001      Neuro Re-ed    Neuro Re-ed Details   biofeedback with supine exercises to review HEP and knack - pt able to hold 5 sec with good control and relaxing in between, clams with green band, UE flexion, LE marching, alt UE/LE               PT Short Term Goals - 02/27/18 1445      PT SHORT TERM GOAL #1   Title  pt will be independent with initial HEP    Time  4    Period  Weeks    Status  Achieved      PT SHORT TERM GOAL #2  Title  pt will be able to contract pelvic floor and hold contraction when coughing    Time  4    Period  Weeks    Status  Achieved      PT SHORT TERM GOAL #3   Title  Pt will perform TUG test and appropriate goal created for balance    Time  4    Period  Weeks    Status  Achieved      PT SHORT TERM GOAL #4   Title  pt will be able to stand on single leg for 2 seconds without assistance to prevent fall    Time  4    Period  Weeks    Status  Not Met        PT Long Term Goals - 02/27/18 1431      PT LONG TERM GOAL #1   Title  pt will be ind with advanced HEP    Time  8    Period  Weeks    Status  Achieved      PT LONG TERM GOAL #2   Title  pt will be able to sustain 2/5 contraction for 5 sec to demonstrate improved endurance for bowel control    Time  8    Period  Weeks    Status  Achieved      PT LONG TERM GOAL #3   Title  pt will be able to demonstrate hip extension to neutral for improved gait    Time  8    Period  Weeks    Status   Partially Met      PT LONG TERM GOAL #4   Title  pt will improve FISI score to 21/24    Baseline  23/24 (exceeded goal from 19/24 initial eval)    Time  8    Period  Weeks    Status  Achieved      PT LONG TERM GOAL #5   Title  TUG < or = to 13 sec due to improved strength and stability for reduced risk of falls    Time  8    Period  Weeks    Status  Partially Met            Plan - 02/27/18 1436    Clinical Impression Statement  Pt is doing well with exercises today.  She has demonstrated meeting goals as stated above.  Pt has increased endurance and reduced leakage.  Pt demonstrated ability to engage pelvic floor when coughing.  Pt is recommended to discharge today with HEP    Clinical Impairments Affecting Rehab Potential  gait disturbance, history includes hernia repair, vaginal deliveries with tearing    PT Treatment/Interventions  ADLs/Self Care Home Management;Biofeedback;Canalith Repostioning;Cryotherapy;Electrical Stimulation;Moist Heat;Balance training;Therapeutic exercise;Therapeutic activities;Stair training;Gait training;Neuromuscular re-education;Patient/family education;Manual techniques;Passive range of motion;Dry needling;Taping    PT Next Visit Plan  discharge today    Consulted and Agree with Plan of Care  Patient       Patient will benefit from skilled therapeutic intervention in order to improve the following deficits and impairments:  Abnormal gait, Impaired tone, Decreased strength, Decreased range of motion, Difficulty walking  Visit Diagnosis: Muscle weakness (generalized)  Cramp and spasm  Stiffness of left hip, not elsewhere classified  Stiffness of right hip, not elsewhere classified     Problem List Patient Active Problem List   Diagnosis Date Noted  . Incontinence of feces   . Visual disturbances 02/06/2017  . Essential hypertension  11/07/2016  . Hyperlipidemia 04/24/2015  . Dysuria 02/11/2015  . Acute cystitis without hematuria  02/11/2015  . Chest pain 02/06/2015  . Left inguinal hernia 08/02/2013  . Gait abnormality 05/14/2013  . Gait disorder   . Nystagmus, end-position   . Routine health maintenance 12/13/2011  . LEG CRAMPS, NOCTURNAL 12/07/2010  . CALLUS, TOE 03/19/2010  . hip pain 03/19/2010  . ATAXIA 11/17/2008  . EMPHYSEMA 10/21/2008  . CERVICAL RADICULOPATHY, RIGHT 10/21/2008  . DIVERTICULOSIS OF COLON 08/14/2008  . RENAL CYST 08/14/2008  . ANEMIA-IRON DEFICIENCY 06/16/2007  . Asthma 06/16/2007  . GERD 06/16/2007  . OSTEOPENIA 06/16/2007    Zannie Cove, PT 02/27/2018, 3:00 PM  [facility] [address] [place], [place], [address] Phone: [phone]   Fax:  [phone]  Name: Kristine Francis MRN: [id number] Date of Birth: [birth year]  PHYSICAL THERAPY DISCHARGE SUMMARY  Visits from Start of Care: 9  Current functional level related to goals / functional outcomes: See above details   Remaining deficits: See above details   Education / Equipment: HEP  Plan: Patient agrees to discharge.  Patient goals were partially met. Patient is being discharged due to being pleased with the current functional level.  ?????     [employer], PT 02/27/18 3:02 PM

## 2018-03-12 DIAGNOSIS — J3089 Other allergic rhinitis: Secondary | ICD-10-CM | POA: Diagnosis not present

## 2018-03-12 DIAGNOSIS — J3081 Allergic rhinitis due to animal (cat) (dog) hair and dander: Secondary | ICD-10-CM | POA: Diagnosis not present

## 2018-03-12 DIAGNOSIS — J301 Allergic rhinitis due to pollen: Secondary | ICD-10-CM | POA: Diagnosis not present

## 2018-03-19 DIAGNOSIS — G609 Hereditary and idiopathic neuropathy, unspecified: Secondary | ICD-10-CM | POA: Diagnosis not present

## 2018-03-19 DIAGNOSIS — G629 Polyneuropathy, unspecified: Secondary | ICD-10-CM | POA: Diagnosis not present

## 2018-03-19 DIAGNOSIS — R270 Ataxia, unspecified: Secondary | ICD-10-CM | POA: Diagnosis not present

## 2018-03-26 DIAGNOSIS — J3081 Allergic rhinitis due to animal (cat) (dog) hair and dander: Secondary | ICD-10-CM | POA: Diagnosis not present

## 2018-03-26 DIAGNOSIS — J3089 Other allergic rhinitis: Secondary | ICD-10-CM | POA: Diagnosis not present

## 2018-03-26 DIAGNOSIS — J301 Allergic rhinitis due to pollen: Secondary | ICD-10-CM | POA: Diagnosis not present

## 2018-03-30 DIAGNOSIS — J301 Allergic rhinitis due to pollen: Secondary | ICD-10-CM | POA: Diagnosis not present

## 2018-03-30 DIAGNOSIS — J3089 Other allergic rhinitis: Secondary | ICD-10-CM | POA: Diagnosis not present

## 2018-03-30 DIAGNOSIS — J3081 Allergic rhinitis due to animal (cat) (dog) hair and dander: Secondary | ICD-10-CM | POA: Diagnosis not present

## 2018-04-02 ENCOUNTER — Other Ambulatory Visit: Payer: Self-pay | Admitting: Family Medicine

## 2018-04-06 ENCOUNTER — Telehealth: Payer: Self-pay | Admitting: Family Medicine

## 2018-04-06 NOTE — Telephone Encounter (Signed)
Notes from Care Everywhere reviewed. Patient was prescribed this by her neurologist at [facility] for secondary stroke prevention in April 2019. Pt notified of results/instructions and verbalized understanding and will continue medication as prescribed. Medication list updated to reflect atorvastatin.

## 2018-04-06 NOTE — Telephone Encounter (Signed)
Copied from [place] [id number]. Topic: Inquiry >> Apr 06, 2018 11:07 AM Pricilla Handler wrote: Reason for CRM: Patient called stating that she has been taking the medication Atorvastatin, possibly by accident, for a long period of time. Patient realized this morning after looking at the bottle that it was prescribed by a Dr. Wynn Maudlin in [place], [place], and not Dr. Sarajane Jews. Patient called her pharmacy regarding this. Please call the patient at [phone] once you can review patient's medications.       Thank You!!!

## 2018-04-09 DIAGNOSIS — J3089 Other allergic rhinitis: Secondary | ICD-10-CM | POA: Diagnosis not present

## 2018-04-09 DIAGNOSIS — J3081 Allergic rhinitis due to animal (cat) (dog) hair and dander: Secondary | ICD-10-CM | POA: Diagnosis not present

## 2018-04-09 DIAGNOSIS — J301 Allergic rhinitis due to pollen: Secondary | ICD-10-CM | POA: Diagnosis not present

## 2018-04-20 DIAGNOSIS — J3081 Allergic rhinitis due to animal (cat) (dog) hair and dander: Secondary | ICD-10-CM | POA: Diagnosis not present

## 2018-04-20 DIAGNOSIS — J3089 Other allergic rhinitis: Secondary | ICD-10-CM | POA: Diagnosis not present

## 2018-04-20 DIAGNOSIS — J301 Allergic rhinitis due to pollen: Secondary | ICD-10-CM | POA: Diagnosis not present

## 2018-04-24 DIAGNOSIS — H52203 Unspecified astigmatism, bilateral: Secondary | ICD-10-CM | POA: Diagnosis not present

## 2018-04-24 DIAGNOSIS — H5500 Unspecified nystagmus: Secondary | ICD-10-CM | POA: Diagnosis not present

## 2018-04-24 DIAGNOSIS — Z961 Presence of intraocular lens: Secondary | ICD-10-CM | POA: Diagnosis not present

## 2018-04-26 DIAGNOSIS — R921 Mammographic calcification found on diagnostic imaging of breast: Secondary | ICD-10-CM | POA: Diagnosis not present

## 2018-04-26 DIAGNOSIS — R922 Inconclusive mammogram: Secondary | ICD-10-CM | POA: Diagnosis not present

## 2018-04-30 DIAGNOSIS — R8290 Unspecified abnormal findings in urine: Secondary | ICD-10-CM | POA: Diagnosis not present

## 2018-05-11 DIAGNOSIS — J301 Allergic rhinitis due to pollen: Secondary | ICD-10-CM | POA: Diagnosis not present

## 2018-05-11 DIAGNOSIS — J3089 Other allergic rhinitis: Secondary | ICD-10-CM | POA: Diagnosis not present

## 2018-05-11 DIAGNOSIS — J3081 Allergic rhinitis due to animal (cat) (dog) hair and dander: Secondary | ICD-10-CM | POA: Diagnosis not present

## 2018-06-05 DIAGNOSIS — I8311 Varicose veins of right lower extremity with inflammation: Secondary | ICD-10-CM | POA: Diagnosis not present

## 2018-06-05 DIAGNOSIS — J3081 Allergic rhinitis due to animal (cat) (dog) hair and dander: Secondary | ICD-10-CM | POA: Diagnosis not present

## 2018-06-05 DIAGNOSIS — L570 Actinic keratosis: Secondary | ICD-10-CM | POA: Diagnosis not present

## 2018-06-05 DIAGNOSIS — L814 Other melanin hyperpigmentation: Secondary | ICD-10-CM | POA: Diagnosis not present

## 2018-06-05 DIAGNOSIS — J301 Allergic rhinitis due to pollen: Secondary | ICD-10-CM | POA: Diagnosis not present

## 2018-06-05 DIAGNOSIS — L821 Other seborrheic keratosis: Secondary | ICD-10-CM | POA: Diagnosis not present

## 2018-06-05 DIAGNOSIS — J3089 Other allergic rhinitis: Secondary | ICD-10-CM | POA: Diagnosis not present

## 2018-06-05 DIAGNOSIS — L819 Disorder of pigmentation, unspecified: Secondary | ICD-10-CM | POA: Diagnosis not present

## 2018-06-05 DIAGNOSIS — D229 Melanocytic nevi, unspecified: Secondary | ICD-10-CM | POA: Diagnosis not present

## 2018-06-05 DIAGNOSIS — I8312 Varicose veins of left lower extremity with inflammation: Secondary | ICD-10-CM | POA: Diagnosis not present

## 2018-06-05 DIAGNOSIS — Z85828 Personal history of other malignant neoplasm of skin: Secondary | ICD-10-CM | POA: Diagnosis not present

## 2018-06-09 ENCOUNTER — Ambulatory Visit (INDEPENDENT_AMBULATORY_CARE_PROVIDER_SITE_OTHER): Payer: Medicare Other | Admitting: Family Medicine

## 2018-06-09 ENCOUNTER — Encounter: Payer: Self-pay | Admitting: Family Medicine

## 2018-06-09 ENCOUNTER — Other Ambulatory Visit (HOSPITAL_COMMUNITY)
Admission: RE | Admit: 2018-06-09 | Discharge: 2018-06-09 | Disposition: A | Discharge status: Home / Self Care | Payer: Medicare Other | Source: Other Acute Inpatient Hospital | Attending: Family Medicine | Admitting: Family Medicine

## 2018-06-09 VITALS — BP 98/56 | HR 71 | Temp 97.9°F | Ht 61.0 in | Wt 88.0 lb

## 2018-06-09 DIAGNOSIS — R300 Dysuria: Secondary | ICD-10-CM | POA: Insufficient documentation

## 2018-06-09 MED ORDER — CEPHALEXIN 500 MG PO CAPS: 500.0000 mg | ORAL_CAPSULE | Freq: Two times a day (BID) | ORAL | 0 refills | Status: AC

## 2018-06-09 NOTE — Progress Notes (Signed)
   Subjective:  Kristine Francis is a 77 y.o. female who presents today for same-day appointment with a chief complaint of dysuria.   HPI:  Dysuria, Acute problem Started this morning.  Stable over that time. She has had some malaise but denies urgency or frequency.  Symptoms are similar to the beginning of her past UTIs.  No fevers or chills.  No back pain.  Urine has had some malodor.  No treatments tried.  No other specific or obvious alleviating or aggravating factors.  ROS: Per HPI  PMH: She reports that she has never smoked. She has never used smokeless tobacco. She reports that she does not drink alcohol or use drugs.  Objective:  Physical Exam: BP (!) 98/56 (BP Location: Left Arm, Patient Position: Sitting, Cuff Size: Normal)   Pulse 71   Temp 97.9 F (36.6 C) (Oral)   Ht 5\' 1"  (1.549 m)   Wt 88 lb (39.9 kg)   SpO2 100%   BMI 16.63 kg/m   Gen: NAD, resting comfortably CV: RRR with no murmurs appreciated Pulm: NWOB, CTAB with no crackles, wheezes, or rhonchi GI: Normal bowel sounds present. Soft, Nontender, Nondistended. MSK: No CVA tenderness  Assessment/Plan:  Dysuria Symptoms consistent with prior UTIs. Patient unable to give urine specimen today. We will start empiric keflex 500mg  bid for the next week based off her symptoms. Patient will drop off urine sample for urine culture. Encouraged good oral hydration. Discussed reasons to return to care.   Algis Greenhouse. Jerline Pain, MD 06/09/2018 1:02 PM

## 2018-06-09 NOTE — Patient Instructions (Signed)
It was nice to see you!  You have a urinary tract infection. Please start the antibiotic.  We will check a urine culture to make sure you do not have a resistant bacteria. We will call you if we need to change your medications.   Please make sure you are drinking plenty of fluids over the next few days.  If your symptoms do not improve over the next 5-7 days, or if they worsen, please let us know. Please also let us know if you have worsening back pain, fevers, chills, or body aches.   Take care, Dr Parker  

## 2018-06-11 LAB — URINE CULTURE: Culture: >=100000 — AB

## 2018-06-11 NOTE — Progress Notes (Signed)
Please inform patient of the following:  Her urine culture confirms UTI. She should finish her antibiotics as prescribed at follow up if her symptoms worsen or do not improve.  Algis Greenhouse. Jerline Pain, MD 06/11/2018 9:20 AM

## 2018-07-10 DIAGNOSIS — E860 Dehydration: Secondary | ICD-10-CM | POA: Diagnosis not present

## 2018-07-10 DIAGNOSIS — G119 Hereditary ataxia, unspecified: Secondary | ICD-10-CM | POA: Diagnosis not present

## 2018-07-10 DIAGNOSIS — G608 Other hereditary and idiopathic neuropathies: Secondary | ICD-10-CM | POA: Diagnosis not present

## 2018-07-27 DIAGNOSIS — J3089 Other allergic rhinitis: Secondary | ICD-10-CM | POA: Diagnosis not present

## 2018-07-27 DIAGNOSIS — J3081 Allergic rhinitis due to animal (cat) (dog) hair and dander: Secondary | ICD-10-CM | POA: Diagnosis not present

## 2018-07-27 DIAGNOSIS — J301 Allergic rhinitis due to pollen: Secondary | ICD-10-CM | POA: Diagnosis not present

## 2018-07-30 DIAGNOSIS — J301 Allergic rhinitis due to pollen: Secondary | ICD-10-CM | POA: Diagnosis not present

## 2018-07-30 DIAGNOSIS — J3089 Other allergic rhinitis: Secondary | ICD-10-CM | POA: Diagnosis not present

## 2018-07-30 DIAGNOSIS — J3081 Allergic rhinitis due to animal (cat) (dog) hair and dander: Secondary | ICD-10-CM | POA: Diagnosis not present

## 2018-08-06 ENCOUNTER — Telehealth: Payer: Self-pay | Admitting: Family Medicine

## 2018-08-06 ENCOUNTER — Other Ambulatory Visit: Payer: Self-pay | Admitting: Gastroenterology

## 2018-08-06 NOTE — Telephone Encounter (Signed)
Copied from [place] [id number]. Topic: Quick Communication - Rx Refill/Question >> Aug 06, 2018 10:30 AM Reyne Dumas L wrote: Medication:  amLODipine (NORVASC) 10 MG tablet  furosemide (LASIX) 20 MG tablet omeprazole (PRILOSEC) 40 MG capsule  Has the patient contacted their pharmacy? No - states no refills left (Agent: If no, request that the patient contact the pharmacy for the refill.) (Agent: If yes, when and what did the pharmacy advise?)  Preferred Pharmacy (with phone number or street name): [facility] ([address] [place] [phone] (Phone) [phone] (Fax)  Agent: Please be advised that RX refills may take up to 3 business days. We ask that you follow-up with your pharmacy.

## 2018-08-06 NOTE — Telephone Encounter (Signed)
Dr. Fry please advise of refills.  Thanks  

## 2018-08-06 NOTE — Telephone Encounter (Signed)
Requesting refill of Prilosec by historical provider [facility] 04/06/17 #90  0 refills  Also requesting refills of  Lasix and Norvasc  LOV when addressed 08/18/17 Dr. Sarajane Jews  Pt has Medicare Wellness Visit scheduled 08/29/18.  Medication:  amLODipine (NORVASC) 10 MG tablet  furosemide (LASIX) 20 MG tablet omeprazole (PRILOSEC) 40 MG capsule  [facility] ([facility] [place]        [phone] (Phone) [phone] (Fax)

## 2018-08-07 NOTE — Telephone Encounter (Signed)
Call in #30 of each and make her an OV

## 2018-08-08 MED ORDER — AMLODIPINE BESYLATE 10 MG PO TABS: 10.0000 mg | ORAL_TABLET | Freq: Every day | ORAL | 0 refills | Status: DC

## 2018-08-08 MED ORDER — FUROSEMIDE 20 MG PO TABS: 20.0000 mg | ORAL_TABLET | Freq: Every day | ORAL | 0 refills | Status: DC

## 2018-08-08 MED ORDER — OMEPRAZOLE 40 MG PO CPDR: 40.0000 mg | DELAYED_RELEASE_CAPSULE | Freq: Every day | ORAL | 0 refills | Status: DC

## 2018-08-13 DIAGNOSIS — J3081 Allergic rhinitis due to animal (cat) (dog) hair and dander: Secondary | ICD-10-CM | POA: Diagnosis not present

## 2018-08-13 DIAGNOSIS — J3089 Other allergic rhinitis: Secondary | ICD-10-CM | POA: Diagnosis not present

## 2018-08-13 DIAGNOSIS — J301 Allergic rhinitis due to pollen: Secondary | ICD-10-CM | POA: Diagnosis not present

## 2018-08-14 ENCOUNTER — Other Ambulatory Visit: Payer: Self-pay | Admitting: Family Medicine

## 2018-08-16 DIAGNOSIS — J3089 Other allergic rhinitis: Secondary | ICD-10-CM | POA: Diagnosis not present

## 2018-08-16 DIAGNOSIS — J301 Allergic rhinitis due to pollen: Secondary | ICD-10-CM | POA: Diagnosis not present

## 2018-08-16 DIAGNOSIS — J3081 Allergic rhinitis due to animal (cat) (dog) hair and dander: Secondary | ICD-10-CM | POA: Diagnosis not present

## 2018-08-23 DIAGNOSIS — J3081 Allergic rhinitis due to animal (cat) (dog) hair and dander: Secondary | ICD-10-CM | POA: Diagnosis not present

## 2018-08-23 DIAGNOSIS — E860 Dehydration: Secondary | ICD-10-CM | POA: Insufficient documentation

## 2018-08-23 DIAGNOSIS — G119 Hereditary ataxia, unspecified: Secondary | ICD-10-CM | POA: Insufficient documentation

## 2018-08-23 DIAGNOSIS — J3089 Other allergic rhinitis: Secondary | ICD-10-CM | POA: Diagnosis not present

## 2018-08-23 DIAGNOSIS — G608 Other hereditary and idiopathic neuropathies: Secondary | ICD-10-CM | POA: Insufficient documentation

## 2018-08-23 DIAGNOSIS — J301 Allergic rhinitis due to pollen: Secondary | ICD-10-CM | POA: Diagnosis not present

## 2018-08-29 ENCOUNTER — Ambulatory Visit: Payer: Medicare Other

## 2018-09-03 DIAGNOSIS — G111 Early-onset cerebellar ataxia: Secondary | ICD-10-CM | POA: Diagnosis not present

## 2018-09-03 DIAGNOSIS — R930 Abnormal findings on diagnostic imaging of skull and head, not elsewhere classified: Secondary | ICD-10-CM | POA: Diagnosis not present

## 2018-09-03 DIAGNOSIS — I6781 Acute cerebrovascular insufficiency: Secondary | ICD-10-CM | POA: Diagnosis not present

## 2018-09-03 DIAGNOSIS — G119 Hereditary ataxia, unspecified: Secondary | ICD-10-CM | POA: Diagnosis not present

## 2018-09-06 DIAGNOSIS — J301 Allergic rhinitis due to pollen: Secondary | ICD-10-CM | POA: Diagnosis not present

## 2018-09-06 DIAGNOSIS — J3081 Allergic rhinitis due to animal (cat) (dog) hair and dander: Secondary | ICD-10-CM | POA: Diagnosis not present

## 2018-09-06 DIAGNOSIS — J3089 Other allergic rhinitis: Secondary | ICD-10-CM | POA: Diagnosis not present

## 2018-09-09 ENCOUNTER — Other Ambulatory Visit: Payer: Self-pay | Admitting: Family Medicine

## 2018-09-14 DIAGNOSIS — R350 Frequency of micturition: Secondary | ICD-10-CM | POA: Diagnosis not present

## 2018-09-18 ENCOUNTER — Ambulatory Visit (INDEPENDENT_AMBULATORY_CARE_PROVIDER_SITE_OTHER): Payer: Medicare Other

## 2018-09-18 VITALS — BP 106/70 | HR 79 | Ht 60.0 in | Wt 82.0 lb

## 2018-09-18 DIAGNOSIS — J3081 Allergic rhinitis due to animal (cat) (dog) hair and dander: Secondary | ICD-10-CM | POA: Diagnosis not present

## 2018-09-18 DIAGNOSIS — J301 Allergic rhinitis due to pollen: Secondary | ICD-10-CM | POA: Diagnosis not present

## 2018-09-18 DIAGNOSIS — E2839 Other primary ovarian failure: Secondary | ICD-10-CM | POA: Diagnosis not present

## 2018-09-18 DIAGNOSIS — J3089 Other allergic rhinitis: Secondary | ICD-10-CM | POA: Diagnosis not present

## 2018-09-18 DIAGNOSIS — Z0000 Encounter for general adult medical examination without abnormal findings: Secondary | ICD-10-CM | POA: Diagnosis not present

## 2018-09-18 NOTE — Progress Notes (Signed)
Subjective:   Kristine Francis is a 77 y.o. female who presents for Medicare Annual (Subsequent) preventive examination.  Reports health as well  Lipids completed 03/2016  OV 08/18/2017 with Dr. Sarajane Jews  And an acute  Married x 83 years 3 sons  2 live in [place] and one lives in [place] the later 3 children; 20 and 55 yo are home schooled  The youngest is 4   Parents; mother had pancreatic cancer Father was cardiac arrest at 54   Diet  BMI is 16 Breakfast - bowl of oatmeal that she makes [employer] sugar, cereal bar, or blueberry muffin Does not drink coffee Drinks hot tea Lunch generally a sandwich; cheese sandwich  Tomato sandwich buys some deli  Supper -cook in the am if she cooks Cans a lot of vegetables Has a garden; growing tomatoes  crowder peas are gone   [employer] Lost 10 lbs since January  States she loves breakfast  States 85- 90 Exercise - Off balance stops her from walking in the neighborhood-  Does not do as much as last year  Stroke March 2018; legs are numb  Went to PT  Neuro Dr. Mickie Bail in [place] - seen every 3 months    There are no preventive care reminders to display for this patient.  Colonoscopy due 02/2019  Bone density 06/2015 -1.9  - discuss repeat at [facility] Will schedule another bone density  Mammogram 04/2017 - will reschedule this year   Will ask at the pharmacy about the shingrix        Objective:     Vitals: BP 106/70   Pulse 79   Ht 5' (1.524 m)   Wt 82 lb (37.2 kg)   SpO2 98%   BMI 16.01 kg/m   Body mass index is 16.01 kg/m.  Advanced Directives 09/18/2018 01/02/2018 08/25/2017 01/26/2017 09/08/2015  Does Patient Have a Medical Advance Directive? Yes Yes Yes No Yes  Type of Advance Directive - Living will - - Living will  Copy of [place] in Chart? - - - - No - copy requested  Would patient like information on creating a medical advance directive? - - - No - Patient declined -    Tobacco Social History    Tobacco Use  Smoking Status Never Smoker  Smokeless Tobacco Never Used     Counseling given: Yes   Clinical Intake:     Past Medical History:  Diagnosis Date  . Allergy   . Asthma    sees Dr. Tiajuana Amass   . Ataxia   . Diverticulosis   . Emphysema   . Gait abnormality 05/14/2013   Ms.Reichow a patient of Dr. Linda Hedges presented first interpreter thousand 13 with a gait dysfunction, she also had an episodic confusion and Loss device. Exam found a mild nystagmus and she was referred to ophthalmology on 03-2812, brain MRI was normal nystagmus was a secondary diagnosis to vestibulitis, ear nose and throat has followed and had seen the patient. At the time of the nystagmus the patie  . GERD (gastroesophageal reflux disease)   . Hepatic steatosis   . Iron deficiency anemia, unspecified   . Kidney cysts 11/16/12   small cyst on left  . Nystagmus, end-position   . Osteopenia   . Renal cyst    Past Surgical History:  Procedure Laterality Date  . ANAL RECTAL MANOMETRY N/A 12/20/2017   Procedure: ANO RECTAL MANOMETRY;  Surgeon: Mauri Pole, MD;  Location: [facility];  Service: Endoscopy;  Laterality: N/A;  . BREAST BIOPSY Bilateral 1970's  . CATARACT EXTRACTION Bilateral LT:02/14/13,RT:02/21/13  . CERVICAL SPINE SURGERY  2010  . COLONOSCOPY  03-12-14   per Dr. Olevia Perches, diverticulosis only, repeat 5 yrs   . EYE SURGERY Bilateral 01/2013&02/2013   left then right  . HERNIA REPAIR  Oct '14-left, remote-right   years ago right; left done '14  . SKIN CANCER EXCISION  05/13/13   FACE, basal cell  . TONSILLECTOMY     Family History  Problem Relation Age of Onset  . Colon cancer Father   . Coronary artery disease Father   . Heart block Father   . Pancreatic cancer Mother   . Colon cancer Paternal Grandmother   . Colon cancer Paternal Aunt        x 2  . Cancer - Lung Paternal Aunt   . Breast cancer Paternal Aunt   . Colon cancer Paternal Aunt   . Ovarian cancer Maternal Aunt    . Heart attack Paternal Grandfather    Social History   Socioeconomic History  . Marital status: Married    Spouse name: Not on file  . Number of children: 3  . Years of education: 57  . Highest education level: Not on file  Occupational History  . 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 Financial resource strain: Not on file  . Food insecurity:    Worry: Not on file    Inability: Not on file  . Transportation needs:    Medical: Not on file    Non-medical: Not on file  Tobacco Use  . Smoking status: Never Smoker  . Smokeless tobacco: Never Used  Substance and Sexual Activity  . Alcohol use: No    Alcohol/week: 0.0 standard drinks  . Drug use: No  . Sexual activity: Yes    Partners: Male  Lifestyle  . Physical activity:    Days per week: Not on file    Minutes per session: Not on file  . Stress: Not on file  Relationships  . Social connections:    Talks on phone: Not on file    Gets together: Not on file    Attends religious service: Not on file    Active member of club or organization: Not on file    Attends meetings of clubs or organizations: Not on file    Relationship status: Not on file  Other Topics Concern  . 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 married - 1965. 3 sons - '66, '69, '70. Sons in good health. Marriage in good health. Enjoys retirement - remains active.          Outpatient Encounter Medications as of 09/18/2018  Medication Sig  . amLODipine (NORVASC) 10 MG tablet Take 1 tablet (10 mg total) by mouth daily.  Marland Kitchen aspirin 325 MG tablet Take 325 mg by mouth daily.  Marland Kitchen atorvastatin (LIPITOR) 10 MG tablet Take 1 tablet by mouth daily.  Marland Kitchen CALCIUM-MAGNESIUM-ZINC PO Take 1 tablet by mouth 3 (three) times daily.  . Cholecalciferol (VITAMIN D PO) Take 1 capsule by mouth daily.  . furosemide (LASIX) 20 MG tablet Take 1 tablet (20 mg  total) by mouth daily.  . Glucosamine Sulfate 1000 MG TABS Take 1 tablet by mouth daily.   Marland Kitchen losartan (COZAAR) 50 MG tablet TAKE 1 TABLET BY  MOUTH ONCE DAILY  . Lysine 500 MG TABS Take 1 tablet by mouth daily.   . Multiple Vitamins-Minerals (CENTRUM SILVER PO) Take 1 capsule by mouth daily.    . Nutritional Supplements (ADRENAL COMPLEX PO) Take 1 capsule by mouth daily.  Marland Kitchen omeprazole (PRILOSEC) 40 MG capsule Take 1 capsule (40 mg total) by mouth daily.  . [DISCONTINUED] omeprazole (PRILOSEC) 40 MG capsule TAKE 1 CAPSULE BY MOUTH ONCE DAILY  . DHA-EPA-Vit B6-B12-Folic Acid (CARDIOVID PLUS) CAPS Take 1 capsule by mouth daily.   No facility-administered encounter medications on file as of 09/18/2018.     Activities of Daily Living In your present state of health, do you have any difficulty performing the following activities: 09/18/2018  Hearing? N  Vision? N  Difficulty concentrating or making decisions? N  Walking or climbing stairs? N  Dressing or bathing? N  Doing errands, shopping? N  Preparing Food and eating ? N  Using the Toilet? N  In the past six months, have you accidently leaked urine? Y  Comment frequent UTI   Do you have problems with loss of bowel control? N  Managing your Medications? N  Managing your Finances? N  Housekeeping or managing your Housekeeping? N  Some recent data might be hidden    Patient Care Team: [name], MD as PCP - General (Family Medicine) Dohmeier, Asencion Partridge, MD (Neurology) Jerrell Belfast, MD (Otolaryngology) Luberta Mutter, MD (Ophthalmology) Tiajuana Amass, MD (Allergy and Immunology) Barbaraann Rondo, MD (Obstetrics and Gynecology) Lafayette Dragon, MD (Inactive) (Gastroenterology)    Assessment:   This is a routine wellness examination for Anshi.  Exercise Activities and Dietary recommendations Current Exercise Habits: Home exercise routine, Type of exercise: strength training/weights;walking, Time (Minutes): 30, Frequency  (Times/Week): 5, Weekly Exercise (Minutes/Week): 150, Intensity: Mild, Exercise limited by: neurologic condition(s)  Goals    . Exercise 150 minutes per week (moderate activity)     May join the [facility] and try the silver sneaker program      . Gain weight     Will try to add calories to your diet.  Eat some nuts or cheese during the day  Will try you ensure meal replacement at Lunch every day   Here are some energy-dense foods that are perfect for gaining weight:  Nuts: Almonds, walnuts, macadamia nuts, peanuts, etc. Dried fruit: Raisins, dates, prunes and others. High-fat dairy: Whole milk, full-fat yogurt, cheese, cream. Fats and oils: Extra virgin olive oil and avocado oil. Grains: Whole grains like oats and brown rice. Meat: Chicken, beef, pork, lamb, etc. Choose fattier cuts. Tubers: Potatoes, sweet potatoes and yams. Dark chocolate, avocados, peanut butter, coconut milk, granola, trail mixes.    Marland Kitchen go back to the [employer] walking around or get a personal trainer        Fall Risk Fall Risk  09/18/2018 08/25/2017 02/02/2017 02/02/2017 12/11/2015  Falls in the past year? Yes Yes Yes Yes Yes  Number falls in past yr: 1 1 2  or more 2 or more 1  Comment in the bedroom but [place] not [place] hurt  [place], but in [place]  - - -  Injury with Fall? - - No No -  Risk for fall due to : - Impaired balance/gait - Impaired balance/gait -  Follow up - Education provided - - Falls evaluation completed  Comment - uses rollator  - - -     Depression Screen PHQ 2/9 Scores 09/18/2018 08/25/2017  02/02/2017 02/02/2017  PHQ - 2 Score 0 0 0 0     Cognitive Function MMSE - Mini Mental State Exam 09/18/2018 09/18/2018  Not completed: (No Data) (No Data)    Ad8 score reviewed for issues:  Issues making decisions:  Less interest in hobbies / activities:  Repeats questions, stories (family complaining):  Trouble using ordinary gadgets (microwave, computer, phone):  Forgets the month  or year:   Mismanaging finances:   Remembering appts:  Daily problems with thinking and/or memory: Ad8 score is=0       6CIT Screen 08/25/2017  What Year? 0 points  What month? 0 points  What time? 0 points  Count back from 20 0 points  Months in reverse 0 points  Repeat phrase 0 points  Total Score 0    Immunization History  Administered Date(s) Administered  . Influenza Split 08/31/2011, 09/18/2012  . Influenza Whole 09/11/2008, 08/18/2009, 09/09/2010  . Influenza, High Dose Seasonal PF 09/18/2013, 09/07/2015, 09/20/2016, 08/18/2017  . Influenza,inj,Quad PF,6+ Mos 09/15/2014, 08/11/2018  . Pneumococcal Conjugate-13 01/29/2014  . Pneumococcal Polysaccharide-23 08/18/2009  . Tetanus 12/24/2012  . Zoster 04/24/2012      Screening Tests Health Maintenance  Topic Date Due  . COLONOSCOPY  03/13/2019  . TETANUS/TDAP  12/24/2022  . INFLUENZA VACCINE  Completed  . DEXA SCAN  Completed  . PNA vac Low Risk Adult  Completed         Plan:      PCP Notes   Health Maintenance Colonoscopy due 02/2019  Bone density 06/2015 -1.9  - discuss repeat at [facility] Will schedule another bone density  Mammogram 04/2017 - will reschedule this year   Will ask at the pharmacy about the shingrix    Abnormal Screens  BMI 16; see note below   Referrals  none  Patient concerns; GYN ordered antibiotic for UTI recently that is resolving    Nurse Concerns; Has lost 10 lbs since January 2018  Discussed weight gain and took an [place] home to try for lunch every day  States she does not feel tired Skips lunch generally and eats breakfast and dinner. Educated on BMI and at 46, she does not want to lose more weight. [facility]/ cheese to her diet and drink the Enlive 350 calories at lunch every day. Will make an apt when leaving to see Dr. Sarajane Jews   Next PCP apt 11/18 at 1pm with Dr. Sarajane Jews        I have personally reviewed and noted the following  in the patient's chart:   . Medical and social history . Use of alcohol, tobacco or illicit drugs  . Current medications and supplements . Functional ability and status . Nutritional status . Physical activity . Advanced directives . List of other physicians . Hospitalizations, surgeries, and ER visits in previous 12 months . Vitals . Screenings to include cognitive, depression, and falls . Referrals and appointments  In addition, I have reviewed and discussed with patient certain preventive protocols, quality metrics, and best practice recommendations. A written personalized care plan for preventive services as well as general preventive health recommendations were provided to patient.     Wynetta Fines, RN  09/18/2018

## 2018-09-18 NOTE — Patient Instructions (Addendum)
Kristine Francis , Thank you for taking time to come for your Medicare Wellness Visit. I appreciate your ongoing commitment to your health goals. Please review the following plan we discussed and let me know if I can assist you in the future.   Please make an apt with Dr. Sarajane Jews for your labs  elam should call you to schedule a bone density  And then you and Dr. Sarajane Jews can review it.   Your Body Mass Index is 16 and it needs to be higher; (18 -25 )   You had a mammogram 04/2017 - it is up to you if you repeat every year  May call them to schedule   Shingrix is a vaccine for the prevention of Shingles in Adults 106 and older.  If you are on Medicare, the shingrix is covered under your Part D plan, so you will take both of the vaccines in the series at your pharmacy. Please check with your benefits regarding applicable copays or out of pocket expenses.  The Shingrix is given in 2 vaccines approx 8 weeks apart. You must receive the 2nd dose prior to 6 months from receipt of the first. Please have the pharmacist print out you Immunization  dates for our office records    These are the goals we discussed: Goals    . Exercise 150 minutes per week (moderate activity)     May join the [facility] and try the silver sneaker program      . Gain weight     Will try to add calories to your diet.  Eat some nuts or cheese during the day  Will try you ensure meal replacement at Lunch every day   Here are some energy-dense foods that are perfect for gaining weight:  Nuts: Almonds, walnuts, macadamia nuts, peanuts, etc. Dried fruit: Raisins, dates, prunes and others. High-fat dairy: Whole milk, full-fat yogurt, cheese, cream. Fats and oils: Extra virgin olive oil and avocado oil. Grains: Whole grains like oats and brown rice. Meat: Chicken, beef, pork, lamb, etc. Choose fattier cuts. Tubers: Potatoes, sweet potatoes and yams. Dark chocolate, avocados, peanut butter, coconut milk, granola, trail mixes.        This is a list of the screening recommended for you and due dates:  Health Maintenance  Topic Date Due  . Colon Cancer Screening  03/13/2019  . Tetanus Vaccine  12/24/2022  . Flu Shot  Completed  . DEXA scan (bone density measurement)  Completed  . Pneumonia vaccines  Completed      Fall Prevention in the Home Falls can cause injuries. They can happen to people of all ages. There are many things you can do to make your home safe and to help prevent falls. What can I do on the outside of my home?  Regularly fix the edges of walkways and driveways and fix any cracks.  Remove anything that might make you trip as you walk through a door, such as a raised step or threshold.  Trim any bushes or trees on the path to your home.  Use bright outdoor lighting.  Clear any walking paths of anything that might make someone trip, such as rocks or tools.  Regularly check to see if handrails are loose or broken. Make sure that both sides of any steps have handrails.  Any raised decks and porches should have guardrails on the edges.  Have any leaves, snow, or ice cleared regularly.  Use sand or salt on walking paths during winter.  Clean up any spills in your garage right away. This includes oil or grease spills. What can I do in the bathroom?  Use night lights.  Install grab bars by the toilet and in the tub and shower. Do not use towel bars as grab bars.  Use non-skid mats or decals in the tub or shower.  If you need to sit down in the shower, use a plastic, non-slip stool.  Keep the floor dry. Clean up any water that spills on the floor as soon as it happens.  Remove soap buildup in the tub or shower regularly.  Attach bath mats securely with double-sided non-slip rug tape.  Do not have throw rugs and other things on the floor that can make you trip. What can I do in the bedroom?  Use night lights.  Make sure that you have a light by your bed that is easy to reach.  Do  not use any sheets or blankets that are too big for your bed. They should not hang down onto the floor.  Have a firm chair that has side arms. You can use this for support while you get dressed.  Do not have throw rugs and other things on the floor that can make you trip. What can I do in the kitchen?  Clean up any spills right away.  Avoid walking on wet floors.  Keep items that you use a lot in easy-to-reach places.  If you need to reach something above you, use a strong step stool that has a grab bar.  Keep electrical cords out of the way.  Do not use floor polish or wax that makes floors slippery. If you must use wax, use non-skid floor wax.  Do not have throw rugs and other things on the floor that can make you trip. What can I do with my stairs?  Do not leave any items on the stairs.  Make sure that there are handrails on both sides of the stairs and use them. Fix handrails that are broken or loose. Make sure that handrails are as long as the stairways.  Check any carpeting to make sure that it is firmly attached to the stairs. Fix any carpet that is loose or worn.  Avoid having throw rugs at the top or bottom of the stairs. If you do have throw rugs, attach them to the floor with carpet tape.  Make sure that you have a light switch at the top of the stairs and the bottom of the stairs. If you do not have them, ask someone to add them for you. What else can I do to help prevent falls?  Wear shoes that: ? Do not have high heels. ? Have rubber bottoms. ? Are comfortable and fit you well. ? Are closed at the toe. Do not wear sandals.  If you use a stepladder: ? Make sure that it is fully opened. Do not climb a closed stepladder. ? Make sure that both sides of the stepladder are locked into place. ? Ask someone to hold it for you, if possible.  Clearly mark and make sure that you can see: ? Any grab bars or handrails. ? First and last steps. ? Where the edge of each  step is.  Use tools that help you move around (mobility aids) if they are needed. These include: ? Canes. ? Walkers. ? Scooters. ? Crutches.  Turn on the lights when you go into a dark area. Replace any light bulbs as soon  as they burn out.  Set up your furniture so you have a clear path. Avoid moving your furniture around.  If any of your floors are uneven, fix them.  If there are any pets around you, be aware of where they are.  Review your medicines with your doctor. Some medicines can make you feel dizzy. This can increase your chance of falling. Ask your doctor what other things that you can do to help prevent falls. This information is not intended to replace advice given to you by your health care provider. Make sure you discuss any questions you have with your health care provider. Document Released: 09/03/2009 Document Revised: 04/14/2016 Document Reviewed: 12/12/2014 Elsevier Interactive Patient Education  2018 [place] Maintenance, Female Adopting a healthy lifestyle and getting preventive care can go a long way to promote health and wellness. Talk with your health care provider about what schedule of regular examinations is right for you. This is a good chance for you to check in with your provider about disease prevention and staying healthy. In between checkups, there are plenty of things you can do on your own. Experts have done a lot of research about which lifestyle changes and preventive measures are most likely to keep you healthy. Ask your health care provider for more information. Weight and diet Eat a healthy diet  Be sure to include plenty of vegetables, fruits, low-fat dairy products, and lean protein.  Do not eat a lot of foods high in solid fats, added sugars, or salt.  Get regular exercise. This is one of the most important things you can do for your health. ? Most adults should exercise for at least 150 minutes each week. The exercise should  increase your heart rate and make you sweat (moderate-intensity exercise). ? Most adults should also do strengthening exercises at least twice a week. This is in addition to the moderate-intensity exercise.  Maintain a healthy weight  Body mass index (BMI) is a measurement that can be used to identify possible weight problems. It estimates body fat based on height and weight. Your health care provider can help determine your BMI and help you achieve or maintain a healthy weight.  For females 76 years of age and older: ? A BMI below 18.5 is considered underweight. ? A BMI of 18.5 to 24.9 is normal. ? A BMI of 25 to 29.9 is considered overweight. ? A BMI of 30 and above is considered obese.  Watch levels of cholesterol and blood lipids  You should start having your blood tested for lipids and cholesterol at 76 years of age, then have this test every 5 years.  You may need to have your cholesterol levels checked more often if: ? Your lipid or cholesterol levels are high. ? You are older than 76 years of age. ? You are at high risk for heart disease.  Cancer screening Lung Cancer  Lung cancer screening is recommended for adults 75-60 years old who are at high risk for lung cancer because of a history of smoking.  A yearly low-dose CT scan of the lungs is recommended for people who: ? Currently smoke. ? Have quit within the past 15 years. ? Have at least a 30-pack-year history of smoking. A pack year is smoking an average of one pack of cigarettes a day for 1 year.  Yearly screening should continue until it has been 15 years since you quit.  Yearly screening should stop if you develop a health problem  that would prevent you from having lung cancer treatment.  Breast Cancer  Practice breast self-awareness. This means understanding how your breasts normally appear and feel.  It also means doing regular breast self-exams. Let your health care provider know about any changes, no matter  how small.  If you are in your 20s or 30s, you should have a clinical breast exam (CBE) by a health care provider every 1-3 years as part of a regular health exam.  If you are 42 or older, have a CBE every year. Also consider having a breast X-ray (mammogram) every year.  If you have a family history of breast cancer, talk to your health care provider about genetic screening.  If you are at high risk for breast cancer, talk to your health care provider about having an MRI and a mammogram every year.  Breast cancer gene (BRCA) assessment is recommended for women who have family members with BRCA-related cancers. BRCA-related cancers include: ? Breast. ? Ovarian. ? Tubal. ? Peritoneal cancers.  Results of the assessment will determine the need for genetic counseling and BRCA1 and BRCA2 testing.  Cervical Cancer Your health care provider may recommend that you be screened regularly for cancer of the pelvic organs (ovaries, uterus, and vagina). This screening involves a pelvic examination, including checking for microscopic changes to the surface of your cervix (Pap test). You may be encouraged to have this screening done every 3 years, beginning at age 56.  For women ages 20-65, health care providers may recommend pelvic exams and Pap testing every 3 years, or they may recommend the Pap and pelvic exam, combined with testing for human papilloma virus (HPV), every 5 years. Some types of HPV increase your risk of cervical cancer. Testing for HPV may also be done on women of any age with unclear Pap test results.  Other health care providers may not recommend any screening for nonpregnant women who are considered low risk for pelvic cancer and who do not have symptoms. Ask your health care provider if a screening pelvic exam is right for you.  If you have had past treatment for cervical cancer or a condition that could lead to cancer, you need Pap tests and screening for cancer for at least 20  years after your treatment. If Pap tests have been discontinued, your risk factors (such as having a new sexual partner) need to be reassessed to determine if screening should resume. Some women have medical problems that increase the chance of getting cervical cancer. In these cases, your health care provider may recommend more frequent screening and Pap tests.  Colorectal Cancer  This type of cancer can be detected and often prevented.  Routine colorectal cancer screening usually begins at 76 years of age and continues through 76 years of age.  Your health care provider may recommend screening at an earlier age if you have risk factors for colon cancer.  Your health care provider may also recommend using home test kits to check for hidden blood in the stool.  A small camera at the end of a tube can be used to examine your colon directly (sigmoidoscopy or colonoscopy). This is done to check for the earliest forms of colorectal cancer.  Routine screening usually begins at age 59.  Direct examination of the colon should be repeated every 5-10 years through 76 years of age. However, you may need to be screened more often if early forms of precancerous polyps or small growths are found.  Skin Cancer  Check your skin from head to toe regularly.  Tell your health care provider about any new moles or changes in moles, especially if there is a change in a mole's shape or color.  Also tell your health care provider if you have a mole that is larger than the size of a pencil eraser.  Always use sunscreen. Apply sunscreen liberally and repeatedly throughout the day.  Protect yourself by wearing long sleeves, pants, a wide-brimmed hat, and sunglasses whenever you are outside.  Heart disease, diabetes, and high blood pressure  High blood pressure causes heart disease and increases the risk of stroke. High blood pressure is more likely to develop in: ? People who have blood pressure in the high  end of the normal range (130-139/85-89 mm Hg). ? People who are overweight or obese. ? People who are African American.  If you are 69-84 years of age, have your blood pressure checked every 3-5 years. If you are 97 years of age or older, have your blood pressure checked every year. You should have your blood pressure measured twice-once when you are at a hospital or clinic, and once when you are not at a hospital or clinic. Record the average of the two measurements. To check your blood pressure when you are not at a hospital or clinic, you can use: ? An automated blood pressure machine at a pharmacy. ? A home blood pressure monitor.  If you are between 50 years and 64 years old, ask your health care provider if you should take aspirin to prevent strokes.  Have regular diabetes screenings. This involves taking a blood sample to check your fasting blood sugar level. ? If you are at a normal weight and have a low risk for diabetes, have this test once every three years after 76 years of age. ? If you are overweight and have a high risk for diabetes, consider being tested at a younger age or more often. Preventing infection Hepatitis B  If you have a higher risk for hepatitis B, you should be screened for this virus. You are considered at high risk for hepatitis B if: ? You were born in a country where hepatitis B is common. Ask your health care provider which countries are considered high risk. ? Your parents were born in a high-risk country, and you have not been immunized against hepatitis B (hepatitis B vaccine). ? You have HIV or AIDS. ? You use needles to inject street drugs. ? You live with someone who has hepatitis B. ? You have had sex with someone who has hepatitis B. ? You get hemodialysis treatment. ? You take certain medicines for conditions, including cancer, organ transplantation, and autoimmune conditions.  Hepatitis C  Blood testing is recommended for: ? Everyone born from  [birth year] through 1964. ? Anyone with known risk factors for hepatitis C.  Sexually transmitted infections (STIs)  You should be screened for sexually transmitted infections (STIs) including gonorrhea and chlamydia if: ? You are sexually active and are younger than 76 years of age. ? You are older than 76 years of age and your health care provider tells you that you are at risk for this type of infection. ? Your sexual activity has changed since you were last screened and you are at an increased risk for chlamydia or gonorrhea. Ask your health care provider if you are at risk.  If you do not have HIV, but are at risk, it may be recommended that you take a prescription  medicine daily to prevent HIV infection. This is called pre-exposure prophylaxis (PrEP). You are considered at risk if: ? You are sexually active and do not regularly use condoms or know the HIV status of your partner(s). ? You take drugs by injection. ? You are sexually active with a partner who has HIV.  Talk with your health care provider about whether you are at high risk of being infected with HIV. If you choose to begin PrEP, you should first be tested for HIV. You should then be tested every 3 months for as long as you are taking PrEP. Pregnancy  If you are premenopausal and you may become pregnant, ask your health care provider about preconception counseling.  If you may become pregnant, take 400 to 800 micrograms (mcg) of folic acid every day.  If you want to prevent pregnancy, talk to your health care provider about birth control (contraception). Osteoporosis and menopause  Osteoporosis is a disease in which the bones lose minerals and strength with aging. This can result in serious bone fractures. Your risk for osteoporosis can be identified using a bone density scan.  If you are 32 years of age or older, or if you are at risk for osteoporosis and fractures, ask your health care provider if you should be  screened.  Ask your health care provider whether you should take a calcium or vitamin D supplement to lower your risk for osteoporosis.  Menopause may have certain physical symptoms and risks.  Hormone replacement therapy may reduce some of these symptoms and risks. Talk to your health care provider about whether hormone replacement therapy is right for you. Follow these instructions at home:  Schedule regular health, dental, and eye exams.  Stay current with your immunizations.  Do not use any tobacco products including cigarettes, chewing tobacco, or electronic cigarettes.  If you are pregnant, do not drink alcohol.  If you are breastfeeding, limit how much and how often you drink alcohol.  Limit alcohol intake to no more than 1 drink per day for nonpregnant women. One drink equals 12 ounces of beer, 5 ounces of wine, or 1 ounces of hard liquor.  Do not use street drugs.  Do not share needles.  Ask your health care provider for help if you need support or information about quitting drugs.  Tell your health care provider if you often feel depressed.  Tell your health care provider if you have ever been abused or do not feel safe at home. This information is not intended to replace advice given to you by your health care provider. Make sure you discuss any questions you have with your health care provider. Document Released: 05/23/2011 Document Revised: 04/14/2016 Document Reviewed: 08/11/2015 Elsevier Interactive Patient Education  [employer].

## 2018-09-20 NOTE — Progress Notes (Signed)
Hannah R Kim, DO  

## 2018-10-04 DIAGNOSIS — J3081 Allergic rhinitis due to animal (cat) (dog) hair and dander: Secondary | ICD-10-CM | POA: Diagnosis not present

## 2018-10-04 DIAGNOSIS — J301 Allergic rhinitis due to pollen: Secondary | ICD-10-CM | POA: Diagnosis not present

## 2018-10-04 DIAGNOSIS — J3089 Other allergic rhinitis: Secondary | ICD-10-CM | POA: Diagnosis not present

## 2018-10-08 ENCOUNTER — Ambulatory Visit (INDEPENDENT_AMBULATORY_CARE_PROVIDER_SITE_OTHER): Payer: Medicare Other | Admitting: Family Medicine

## 2018-10-08 ENCOUNTER — Encounter: Payer: Self-pay | Admitting: Family Medicine

## 2018-10-08 VITALS — BP 130/62 | HR 82 | Temp 98.2°F | Wt 90.2 lb

## 2018-10-08 DIAGNOSIS — I10 Essential (primary) hypertension: Secondary | ICD-10-CM | POA: Diagnosis not present

## 2018-10-08 LAB — POC URINALSYSI DIPSTICK (AUTOMATED)
Bilirubin, UA: NEGATIVE
Blood, UA: NEGATIVE
Glucose, UA: NEGATIVE
Ketones, UA: NEGATIVE
Leukocytes, UA: NEGATIVE
Nitrite, UA: NEGATIVE
Protein, UA: NEGATIVE
Spec Grav, UA: 1.015
Urobilinogen, UA: 0.2 U/dL
pH, UA: 6

## 2018-10-08 LAB — CBC WITH DIFFERENTIAL/PLATELET
Basophils Absolute: 0.1 10*3/uL (ref 0.0–0.1)
Basophils Relative: 0.7 % (ref 0.0–3.0)
Eosinophils Absolute: 0.1 10*3/uL (ref 0.0–0.7)
Eosinophils Relative: 1.3 % (ref 0.0–5.0)
HCT: 29.3 % — ABNORMAL LOW (ref 36.0–46.0)
Hemoglobin: 9.5 g/dL — ABNORMAL LOW (ref 12.0–15.0)
Lymphocytes Relative: 13.2 % (ref 12.0–46.0)
Lymphs Abs: 1.2 10*3/uL (ref 0.7–4.0)
MCHC: 32.5 g/dL (ref 30.0–36.0)
MCV: 71.7 fl — ABNORMAL LOW (ref 78.0–100.0)
Monocytes Absolute: 0.8 10*3/uL (ref 0.1–1.0)
Monocytes Relative: 8.3 % (ref 3.0–12.0)
Neutro Abs: 7.2 10*3/uL (ref 1.4–7.7)
Neutrophils Relative %: 76.5 % (ref 43.0–77.0)
Platelets: 406 10*3/uL — ABNORMAL HIGH (ref 150.0–400.0)
RBC: 4.08 Mil/uL (ref 3.87–5.11)
RDW: 20 % — ABNORMAL HIGH (ref 11.5–15.5)
WBC: 9.4 10*3/uL (ref 4.0–10.5)

## 2018-10-08 LAB — HEPATIC FUNCTION PANEL
ALT: 10 U/L (ref 0–35)
AST: 17 U/L (ref 0–37)
Albumin: 4 g/dL (ref 3.5–5.2)
Alkaline Phosphatase: 69 U/L (ref 39–117)
Bilirubin, Direct: 0.1 mg/dL (ref 0.0–0.3)
Total Bilirubin: 0.6 mg/dL (ref 0.2–1.2)
Total Protein: 6.6 g/dL (ref 6.0–8.3)

## 2018-10-08 LAB — BASIC METABOLIC PANEL
BUN: 16 mg/dL (ref 6–23)
CO2: 31 mEq/L (ref 19–32)
Calcium: 9.7 mg/dL (ref 8.4–10.5)
Chloride: 102 mEq/L (ref 96–112)
Creatinine, Ser: 0.62 mg/dL (ref 0.40–1.20)
GFR: 99.36 mL/min (ref >60.00)
Glucose, Bld: 106 mg/dL — ABNORMAL HIGH (ref 70–99)
Potassium: 3.7 mEq/L (ref 3.5–5.1)
Sodium: 141 mEq/L (ref 135–145)

## 2018-10-08 MED ORDER — FUROSEMIDE 20 MG PO TABS: 20.0000 mg | ORAL_TABLET | Freq: Every day | ORAL | 3 refills | Status: DC

## 2018-10-08 MED ORDER — LOSARTAN POTASSIUM 50 MG PO TABS: 50.0000 mg | ORAL_TABLET | Freq: Every day | ORAL | 3 refills | Status: DC

## 2018-10-08 MED ORDER — OMEPRAZOLE 20 MG PO CPDR: 20.0000 mg | DELAYED_RELEASE_CAPSULE | Freq: Every day | ORAL | 3 refills | Status: DC

## 2018-10-08 MED ORDER — ATORVASTATIN CALCIUM 10 MG PO TABS: 10.0000 mg | ORAL_TABLET | Freq: Every day | ORAL | 3 refills | Status: DC

## 2018-10-08 MED ORDER — AMLODIPINE BESYLATE 10 MG PO TABS: 10.0000 mg | ORAL_TABLET | Freq: Every day | ORAL | 3 refills | Status: DC

## 2018-10-08 NOTE — Progress Notes (Signed)
   Subjective:    Patient ID: Kristine Francis, female    DOB: 1940-12-07, 77 y.o.   MRN: [id number]  HPI Here to follow up. She feels fine. She uses a rolling walker everywhere she goes due to the ataxic gate. Her BP is stable.    Review of Systems  Constitutional: Negative.   Respiratory: Negative.   Cardiovascular: Negative.   Neurological: Positive for weakness.       Objective:   Physical Exam  Constitutional: She is oriented to person, place, and time. She appears well-developed and well-nourished.  Cardiovascular: Normal rate, regular rhythm, normal heart sounds and intact distal pulses.  Pulmonary/Chest: Effort normal and breath sounds normal.  Musculoskeletal: She exhibits no edema.  Neurological: She is alert and oriented to person, place, and time.          Assessment & Plan:  Her HTN is stable. Get labs.  [name], MD

## 2018-10-10 ENCOUNTER — Other Ambulatory Visit: Payer: Self-pay

## 2018-10-12 ENCOUNTER — Other Ambulatory Visit: Payer: Self-pay | Admitting: Family Medicine

## 2018-10-12 MED ORDER — FERROUS SULFATE 325 (65 FE) MG PO TABS: 325.0000 mg | ORAL_TABLET | Freq: Two times a day (BID) | ORAL | 11 refills | Status: DC

## 2018-10-15 DIAGNOSIS — J3081 Allergic rhinitis due to animal (cat) (dog) hair and dander: Secondary | ICD-10-CM | POA: Diagnosis not present

## 2018-10-15 DIAGNOSIS — J301 Allergic rhinitis due to pollen: Secondary | ICD-10-CM | POA: Diagnosis not present

## 2018-10-15 DIAGNOSIS — J3089 Other allergic rhinitis: Secondary | ICD-10-CM | POA: Diagnosis not present

## 2018-10-25 DIAGNOSIS — J3089 Other allergic rhinitis: Secondary | ICD-10-CM | POA: Diagnosis not present

## 2018-10-25 DIAGNOSIS — J301 Allergic rhinitis due to pollen: Secondary | ICD-10-CM | POA: Diagnosis not present

## 2018-10-25 DIAGNOSIS — J3081 Allergic rhinitis due to animal (cat) (dog) hair and dander: Secondary | ICD-10-CM | POA: Diagnosis not present

## 2018-10-31 ENCOUNTER — Ambulatory Visit (INDEPENDENT_AMBULATORY_CARE_PROVIDER_SITE_OTHER): Payer: Medicare Other | Admitting: Family Medicine

## 2018-10-31 ENCOUNTER — Encounter: Payer: Self-pay | Admitting: Family Medicine

## 2018-10-31 VITALS — BP 130/74 | HR 83 | Temp 98.1°F | Wt 88.5 lb

## 2018-10-31 DIAGNOSIS — J3089 Other allergic rhinitis: Secondary | ICD-10-CM | POA: Diagnosis not present

## 2018-10-31 DIAGNOSIS — N390 Urinary tract infection, site not specified: Secondary | ICD-10-CM

## 2018-10-31 MED ORDER — CIPROFLOXACIN HCL 500 MG PO TABS: 500.0000 mg | ORAL_TABLET | Freq: Two times a day (BID) | ORAL | 0 refills | Status: DC

## 2018-10-31 NOTE — Progress Notes (Signed)
   Subjective:    Patient ID: Kristine Francis, female    DOB: 1941-03-10, 77 y.o.   MRN: [id number]  HPI Here for 2 issues. First for the past  2 weeks she has had cloudy urine with a foul odor, as well as some increased frequency. No pain or fever. Also she has had several weeks of PND and an occasional cough that brings up clear sputum.    Review of Systems  Constitutional: Negative.   HENT: Positive for postnasal drip. Negative for congestion, sinus pressure, sinus pain and sore throat.   Eyes: Negative.   Respiratory: Positive for cough.   Cardiovascular: Negative.   Gastrointestinal: Negative.   Genitourinary: Positive for frequency. Negative for dysuria, hematuria and urgency.       Objective:   Physical Exam  Constitutional: She appears well-developed and well-nourished. No distress.  HENT:  Right Ear: External ear normal.  Left Ear: External ear normal.  Nose: Nose normal.  Mouth/Throat: Oropharynx is clear and moist.  Eyes: Conjunctivae are normal.  Neck: No thyromegaly present.  Pulmonary/Chest: Effort normal and breath sounds normal. No stridor. No respiratory distress. She has no wheezes. She has no rales.  Abdominal: Soft. Bowel sounds are normal. She exhibits no distension and no mass. There is no tenderness. There is no rebound and no guarding.  Lymphadenopathy:    She has no cervical adenopathy.          Assessment & Plan:  She has some seasonal allergies and I suggested she try Claritin daily. She could not produce a urine sample today but we will treat her UTI with Cipro. [name], MD

## 2018-11-12 DIAGNOSIS — J3081 Allergic rhinitis due to animal (cat) (dog) hair and dander: Secondary | ICD-10-CM | POA: Diagnosis not present

## 2018-11-12 DIAGNOSIS — J3089 Other allergic rhinitis: Secondary | ICD-10-CM | POA: Diagnosis not present

## 2018-11-12 DIAGNOSIS — J301 Allergic rhinitis due to pollen: Secondary | ICD-10-CM | POA: Diagnosis not present

## 2018-11-27 DIAGNOSIS — J3081 Allergic rhinitis due to animal (cat) (dog) hair and dander: Secondary | ICD-10-CM | POA: Diagnosis not present

## 2018-11-27 DIAGNOSIS — J3089 Other allergic rhinitis: Secondary | ICD-10-CM | POA: Diagnosis not present

## 2018-11-27 DIAGNOSIS — J301 Allergic rhinitis due to pollen: Secondary | ICD-10-CM | POA: Diagnosis not present

## 2018-11-29 DIAGNOSIS — J3081 Allergic rhinitis due to animal (cat) (dog) hair and dander: Secondary | ICD-10-CM | POA: Diagnosis not present

## 2018-11-29 DIAGNOSIS — J3089 Other allergic rhinitis: Secondary | ICD-10-CM | POA: Diagnosis not present

## 2018-11-29 DIAGNOSIS — J301 Allergic rhinitis due to pollen: Secondary | ICD-10-CM | POA: Diagnosis not present

## 2018-11-30 DIAGNOSIS — G549 Nerve root and plexus disorder, unspecified: Secondary | ICD-10-CM | POA: Diagnosis not present

## 2018-11-30 DIAGNOSIS — D72820 Lymphocytosis (symptomatic): Secondary | ICD-10-CM | POA: Diagnosis not present

## 2018-11-30 DIAGNOSIS — G629 Polyneuropathy, unspecified: Secondary | ICD-10-CM | POA: Diagnosis not present

## 2018-11-30 DIAGNOSIS — G119 Hereditary ataxia, unspecified: Secondary | ICD-10-CM | POA: Diagnosis not present

## 2018-12-11 DIAGNOSIS — Z124 Encounter for screening for malignant neoplasm of cervix: Secondary | ICD-10-CM | POA: Diagnosis not present

## 2018-12-13 DIAGNOSIS — Z9181 History of falling: Secondary | ICD-10-CM | POA: Diagnosis not present

## 2018-12-13 DIAGNOSIS — G119 Hereditary ataxia, unspecified: Secondary | ICD-10-CM | POA: Diagnosis not present

## 2018-12-13 DIAGNOSIS — J3081 Allergic rhinitis due to animal (cat) (dog) hair and dander: Secondary | ICD-10-CM | POA: Diagnosis not present

## 2018-12-13 DIAGNOSIS — R278 Other lack of coordination: Secondary | ICD-10-CM | POA: Diagnosis not present

## 2018-12-13 DIAGNOSIS — G629 Polyneuropathy, unspecified: Secondary | ICD-10-CM | POA: Diagnosis not present

## 2018-12-13 DIAGNOSIS — J301 Allergic rhinitis due to pollen: Secondary | ICD-10-CM | POA: Diagnosis not present

## 2018-12-13 DIAGNOSIS — J3089 Other allergic rhinitis: Secondary | ICD-10-CM | POA: Diagnosis not present

## 2018-12-21 ENCOUNTER — Ambulatory Visit: Payer: Medicare Other | Admitting: Rehabilitation

## 2018-12-24 ENCOUNTER — Ambulatory Visit: Payer: Medicare Other | Admitting: Rehabilitative and Restorative Service Providers"

## 2018-12-26 ENCOUNTER — Other Ambulatory Visit: Payer: Self-pay

## 2018-12-26 ENCOUNTER — Encounter: Payer: Self-pay | Admitting: Physical Therapy

## 2018-12-26 ENCOUNTER — Ambulatory Visit: Payer: Medicare Other | Attending: Neurology | Admitting: Physical Therapy

## 2018-12-26 DIAGNOSIS — R2681 Unsteadiness on feet: Secondary | ICD-10-CM | POA: Insufficient documentation

## 2018-12-26 DIAGNOSIS — R2689 Other abnormalities of gait and mobility: Secondary | ICD-10-CM

## 2018-12-26 DIAGNOSIS — M6281 Muscle weakness (generalized): Secondary | ICD-10-CM | POA: Diagnosis not present

## 2018-12-26 DIAGNOSIS — R278 Other lack of coordination: Secondary | ICD-10-CM

## 2018-12-26 DIAGNOSIS — R260 Ataxic gait: Secondary | ICD-10-CM | POA: Diagnosis not present

## 2018-12-26 NOTE — Therapy (Signed)
[facility] [address] [place], [place], [address] Phone: [phone]   Fax:  [phone]  Physical Therapy Evaluation  Patient Details  Name: Kristine Francis MRN: [id number] Date of Birth: 18-Sep-1941 Referring Provider (PT): Regis Bill, MD   Encounter Date: 12/26/2018  PT End of Session - 12/26/18 1328    Visit Number  1    Number of Visits  17    Date for PT Re-Evaluation  02/24/19    Authorization Type  Medicare Primary; [employer] Secondary (no PTA); 10th visit PN    PT Start Time  1155    PT Stop Time  1240    PT Time Calculation (min)  45 min    Activity Tolerance  Patient tolerated treatment well    Behavior During Therapy  [facility] for tasks assessed/performed       Past Medical History:  Diagnosis Date  . Allergy   . Asthma    sees Dr. Tiajuana Amass   . Ataxia   . Diverticulosis   . Emphysema   . Gait abnormality 05/14/2013   Ms.Uriostegui a patient of Dr. Linda Hedges presented first interpreter thousand 13 with a gait dysfunction, she also had an episodic confusion and Loss device. Exam found a mild nystagmus and she was referred to ophthalmology on 03-2812, brain MRI was normal nystagmus was a secondary diagnosis to vestibulitis, ear nose and throat has followed and had seen the patient. At the time of the nystagmus the patie  . GERD (gastroesophageal reflux disease)   . Hepatic steatosis   . Iron deficiency anemia, unspecified   . Kidney cysts 11/16/12   small cyst on left  . Nystagmus, end-position   . Osteopenia   . Renal cyst     Past Surgical History:  Procedure Laterality Date  . ANAL RECTAL MANOMETRY N/A 12/20/2017   Procedure: ANO RECTAL MANOMETRY;  Surgeon: Mauri Pole, MD;  Location: [facility];  Service: Endoscopy;  Laterality: N/A;  . BREAST BIOPSY Bilateral 1970's  . CATARACT EXTRACTION Bilateral LT:02/14/13,RT:02/21/13  . CERVICAL SPINE SURGERY  2010  . COLONOSCOPY  03-12-14   per Dr.  Olevia Perches, diverticulosis only, repeat 5 yrs   . EYE SURGERY Bilateral 01/2013&02/2013   left then right  . HERNIA REPAIR  Oct '14-left, remote-right   years ago right; left done '14  . SKIN CANCER EXCISION  05/13/13   FACE, basal cell  . TONSILLECTOMY      There were no vitals filed for this visit.   Subjective Assessment - 12/26/18 1200    Subjective  Pt reports that in March of 2018 she had a stroke that caused her to have ataxia in both LE and noted that visual vertical was turned sideways.  Pt has to hold furniture to walk in the house, uses walking stick to walk to church, and uses rollator for longer community distances/shopping and at times uses shopping cart at grocery store.  Pt is more off balance with one fall in bedroom.  Pt also reports lack of coordination.  Pt also occasionally experiences the world as tilted.      Patient is accompained by:  Family member    Pertinent History  HTN, renal cyst, osteopenia, nystagmus, kidney cysts, iron deficiency anemia, hepatic steatosis, gait abnormality    Limitations  Walking;Standing    Patient Stated Goals  To walk without falling and to walk more normally    Currently in Pain?  No/denies  [facility] PT Assessment - 12/26/18 1207      Assessment   Medical Diagnosis  cerebellar ataxia    Referring Provider (PT)  Regis Bill, MD    Onset Date/Surgical Date  --   March of 2018 was CVA   Hand Dominance  Right    Prior Therapy  yes, at this outpatient facility      Precautions   Precautions  Other (comment)    Precaution Comments  HTN, renal cyst, osteopenia, nystagmus, kidney cysts, iron deficiency anemia, hepatic steatosis, gait abnormality      Balance Screen   Has the patient fallen in the past 6 months  Yes    How many times?  1      [place] residence    Living Arrangements  Spouse/significant other    Type of [place] to enter    Entrance  Stairs-Number of Steps  [place]  One level    [place] - 4 wheels;Other (comment)   trekking pole     Prior Function   Level of Independence  Independent   prior to CVA   Leisure  Going to church and Bible Study      Observation/Other Assessments   Focus on Therapeutic Outcomes (FOTO)   Not assessed; CVA was almost 2 years ago      Sensation   Light Touch  Appears Intact    Proprioception  Appears Intact      Coordination   Gross Motor Movements are Fluid and Coordinated  No    Fine Motor Movements are Fluid and Coordinated  No    Finger Nose Finger Test  mild dysmetria    Heel Shin Test  mild dysmetria      Posture/Postural Control   Posture/Postural Control  Postural limitations    Postural Limitations  Rounded Shoulders;Forward head;Decreased lumbar lordosis;Increased thoracic kyphosis;Posterior pelvic tilt      Tone   Assessment Location  Right Lower Extremity;Left Lower Extremity      ROM / Strength   AROM / PROM / Strength  Strength      Strength   Overall Strength  Deficits    Overall Strength Comments  R > L weakness.  LLE: 4/5; RLE: 4-/5 overall      Ambulation/Gait   Ambulation/Gait  Yes    Ambulation/Gait Assistance  5: Supervision;4: Min assist    Ambulation/Gait Assistance Details  supervision when ambulating with rollator, minimal HHA when ambulating without device.  Close supervision when ambulating with trekking pole - pt reports her trekking pole is much taller and thicker than clinic's trekking pole    Engineer, civil (consulting) (Feet)  230 Feet    Assistive device  4-wheeled walker    Gait Pattern  Step-through pattern;Ataxic;Decreased trunk rotation;Trunk flexed;Wide base of support   R shoulder hiked   Ambulation Surface  Level;Indoor    Curb  4: Min assist    Curb Details (indicate cue type and reason)  HHA with posterior LOB when stepping up      Standardized Balance Assessment   Standardized Balance Assessment  Berg Balance  Test;Five Times Sit to Stand;10 meter walk test    Five times sit to stand comments   15.28 seconds with wdie BOS, no anterior pelvic tilt to shift COG forwards with posterior LOB    10 Meter Walk  8.44 seconds with rollator or 3.88 ft/sec;  with trekking pole 10.60 seconds or 3.09 ft/sec       RLE Tone   RLE Tone  Mild;Other (comment)   mild clonus with quick DF     LLE Tone   LLE Tone  Within Functional Limits                Objective measurements completed on examination: See above findings.              PT Education - 12/26/18 1327    Education Details  clinical findings, PT POC and goals for therapy    Person(s) Educated  Patient    Methods  Explanation    Comprehension  Verbalized understanding       PT Short Term Goals - 12/26/18 1338      PT SHORT TERM GOAL #1   Title  Patient will participate in further assessment of balance and vestibular deficits    Time  4    Period  Weeks    Status  New    Target Date  01/25/19      PT SHORT TERM GOAL #2   Title  Pt will improve five time sit to stand from mat, no UE support to </= 15 seconds but with improved anterior weight shift and decreased posterior LOB    Baseline   15.28 seconds with wdie BOS, no anterior pelvic tilt to shift COG forwards with posterior LOB     Time  4    Period  Weeks    Status  New    Target Date  01/25/19      PT SHORT TERM GOAL #3   Title  Pt will continue to demonstrate gait velocity of >/= 3.9 ft/sec with rollator but with improved upright posture and decreased WB through UE    Baseline  3.88 ft/sec with rollator with flexed posture and R shoulder elevation and uncontrolled forward momentum    Time  4    Period  Weeks    Status  New    Target Date  01/25/19      PT SHORT TERM GOAL #4   Title  Pt will negotiate one step with one UE support with supervision and no posterior LOB    Baseline  min A due to posterior LOB    Time  4    Period  Weeks    Status  New    Target  Date  01/25/19      PT SHORT TERM GOAL #5   Title  Pt will ambulate x 500' over pavement and grassy terrain with trekking pole and close supervision    Time  4    Period  Weeks    Status  New    Target Date  01/25/19        PT Long Term Goals - 12/26/18 1345      PT LONG TERM GOAL #1   Title  Pt will demonstrate independence with final HEP    Time  8    Period  Weeks    Status  New    Target Date  02/24/19      PT LONG TERM GOAL #2   Title  Pt will improve five time sit to stand without use of UE to </= 13 seconds and no posterior LOB    Time  8    Period  Weeks    Status  New    Target Date  02/24/19      PT LONG  TERM GOAL #3   Title  Pt will improve gait velocity with trekking pole to >/= 3.4 ft/sec with supervision over level surfaces    Baseline  3.09 ft/sec indoors    Time  8    Period  Weeks    Status  New    Target Date  02/24/19      PT LONG TERM GOAL #4   Title  Pt will improve BERG by 4 points to indicate decreased falls risk    Baseline  TBD    Time  8    Period  Weeks    Status  New    Target Date  02/24/19      PT LONG TERM GOAL #5   Title  Pt will ambulate x 1000' outside over pavement and grass with trekking pole and supervision and will negotiate one step with one UE support MOD I for home entry/exit    Time  8    Period  Weeks    Status  New    Target Date  02/24/19      Additional Long Term Goals   Additional Long Term Goals  Yes      PT LONG TERM GOAL #6   Title  Vestibular goal if indicated             Plan - 12/26/18 1331    Clinical Impression Statement  Pt is a 78 year old female referred to Neuro OPPT for evaluation of cerebellar ataxia, sensory neuropathy, and sensory ataxia following CVA in 2018.  Pt's PMH is significant for the following: HTN, renal cyst, osteopenia, nystagmus, kidney cysts, iron deficiency anemia, hepatic steatosis, gait abnormality. The following deficits were noted during pt's exam: impaired posture and  postural control, impaired coordination, timing and sequencing, impaired balance, ataxic gait and oculomotor and vestibular impairments.  Pt demonstrated significant posterior LOB with sit > stand without UE support and despite increased gait velocity pt relies heavily on bilat UE for support and to control forward momentum. Pt would benefit from skilled PT to address these impairments and functional limitations to maximize functional mobility independence and reduce falls risk.    History and Personal Factors relevant to plan of care:  CVA in 2018 but cerebellar ataxia and neuropathy are continuing to progress with unclear etiology (progressive in nature); fall at home, ambulates from home > church with walking stick over uneven terrain, HTN, renal cyst, osteopenia, nystagmus, kidney cysts, iron deficiency anemia, hepatic steatosis, gait abnormality    Clinical Presentation  Evolving    Clinical Presentation due to:  cerebellar ataxia and neuropathy are continuing to progress with unclear etiology (progressive in nature); fall at home, ambulates from home > church with walking stick over uneven terrain, HTN, renal cyst, osteopenia, nystagmus, kidney cysts, iron deficiency anemia, hepatic steatosis, gait abnormality    Clinical Decision Making  Moderate    Rehab Potential  Fair    Clinical Impairments Affecting Rehab Potential  deficits appear to be progressive in nature; unclear etiology    PT Frequency  2x / week    PT Duration  8 weeks    PT Treatment/Interventions  ADLs/Self Care Home Management;Aquatic Therapy;DME Instruction;Gait training;Stair training;Functional mobility training;Therapeutic activities;Therapeutic exercise;Balance training;Neuromuscular re-education;Patient/family education;Vestibular;Visual/perceptual remediation/compensation    PT Next Visit Plan  Assess BERG, vestibular assessment.  Assess gait with patient's trekking pole.  Postural exercises (thoracic extension and anterior  pelvic tilt to shift weight forwards if possible).  Initiate HEP    Recommended Other Services  Order for OT?    Consulted and Agree with Plan of Care  Patient       Patient will benefit from skilled therapeutic intervention in order to improve the following deficits and impairments:  Abnormal gait, Decreased balance, Decreased coordination, Decreased strength, Difficulty walking, Dizziness, Impaired tone, Impaired UE functional use, Impaired vision/preception, Postural dysfunction  Visit Diagnosis: Other lack of coordination  Ataxic gait  Other abnormalities of gait and mobility  Unsteadiness on feet  Muscle weakness (generalized)     Problem List Patient Active Problem List   Diagnosis Date Noted  . Incontinence of feces   . Visual disturbances 02/06/2017  . Essential hypertension 11/07/2016  . Hyperlipidemia 04/24/2015  . Dysuria 02/11/2015  . Acute cystitis without hematuria 02/11/2015  . Chest pain 02/06/2015  . Left inguinal hernia 08/02/2013  . Gait abnormality 05/14/2013  . Gait disorder   . Nystagmus, end-position   . Routine health maintenance 12/13/2011  . LEG CRAMPS, NOCTURNAL 12/07/2010  . CALLUS, TOE 03/19/2010  . hip pain 03/19/2010  . ATAXIA 11/17/2008  . EMPHYSEMA 10/21/2008  . CERVICAL RADICULOPATHY, RIGHT 10/21/2008  . DIVERTICULOSIS OF COLON 08/14/2008  . RENAL CYST 08/14/2008  . ANEMIA-IRON DEFICIENCY 06/16/2007  . Asthma 06/16/2007  . GERD 06/16/2007  . OSTEOPENIA 06/16/2007    Rico Junker, PT, DPT 12/26/18    1:50 PM    [facility] [address] [place], [place], [address] Phone: [phone]   Fax:  [phone]  Name: QUINCY PRISCO MRN: [id number] Date of Birth: 1941/01/18

## 2018-12-28 DIAGNOSIS — J3089 Other allergic rhinitis: Secondary | ICD-10-CM | POA: Diagnosis not present

## 2018-12-28 DIAGNOSIS — J3081 Allergic rhinitis due to animal (cat) (dog) hair and dander: Secondary | ICD-10-CM | POA: Diagnosis not present

## 2018-12-28 DIAGNOSIS — J301 Allergic rhinitis due to pollen: Secondary | ICD-10-CM | POA: Diagnosis not present

## 2019-01-04 ENCOUNTER — Ambulatory Visit: Payer: Medicare Other | Admitting: Physical Therapy

## 2019-01-04 ENCOUNTER — Encounter: Payer: Self-pay | Admitting: Physical Therapy

## 2019-01-04 DIAGNOSIS — R2681 Unsteadiness on feet: Secondary | ICD-10-CM

## 2019-01-04 DIAGNOSIS — M6281 Muscle weakness (generalized): Secondary | ICD-10-CM

## 2019-01-04 DIAGNOSIS — R2689 Other abnormalities of gait and mobility: Secondary | ICD-10-CM | POA: Diagnosis not present

## 2019-01-04 DIAGNOSIS — R260 Ataxic gait: Secondary | ICD-10-CM

## 2019-01-04 DIAGNOSIS — R278 Other lack of coordination: Secondary | ICD-10-CM | POA: Diagnosis not present

## 2019-01-04 NOTE — Therapy (Addendum)
[facility] [address] [place], [place], [address] Phone: [phone]   Fax:  [phone]  Physical Therapy Treatment  Patient Details  Name: Kristine Francis MRN: [id number] Date of Birth: 03-22-1941 Referring Provider (PT): Regis Bill, MD   Encounter Date: 01/04/2019  PT End of Session - 01/04/19 1235    Visit Number  2    Number of Visits  17    Date for PT Re-Evaluation  02/24/19    Authorization Type  Medicare Primary; [employer] Secondary (no PTA); 10th visit PN    PT Start Time  0801    PT Stop Time  0846    PT Time Calculation (min)  45 min    Activity Tolerance  Patient tolerated treatment well    Behavior During Therapy  [facility] for tasks assessed/performed       Past Medical History:  Diagnosis Date  . Allergy   . Asthma    sees Dr. Tiajuana Amass   . Ataxia   . Diverticulosis   . Emphysema   . Gait abnormality 05/14/2013   Ms.Stanbrough a patient of Dr. Linda Hedges presented first interpreter thousand 13 with a gait dysfunction, she also had an episodic confusion and Loss device. Exam found a mild nystagmus and she was referred to ophthalmology on 03-2812, brain MRI was normal nystagmus was a secondary diagnosis to vestibulitis, ear nose and throat has followed and had seen the patient. At the time of the nystagmus the patie  . GERD (gastroesophageal reflux disease)   . Hepatic steatosis   . Iron deficiency anemia, unspecified   . Kidney cysts 11/16/12   small cyst on left  . Nystagmus, end-position   . Osteopenia   . Renal cyst     Past Surgical History:  Procedure Laterality Date  . ANAL RECTAL MANOMETRY N/A 12/20/2017   Procedure: ANO RECTAL MANOMETRY;  Surgeon: Mauri Pole, MD;  Location: [facility];  Service: Endoscopy;  Laterality: N/A;  . BREAST BIOPSY Bilateral 1970's  . CATARACT EXTRACTION Bilateral LT:02/14/13,RT:02/21/13  . CERVICAL SPINE SURGERY  2010  . COLONOSCOPY  03-12-14   per Dr.  Olevia Perches, diverticulosis only, repeat 5 yrs   . EYE SURGERY Bilateral 01/2013&02/2013   left then right  . HERNIA REPAIR  Oct '14-left, remote-right   years ago right; left done '14  . SKIN CANCER EXCISION  05/13/13   FACE, basal cell  . TONSILLECTOMY      There were no vitals filed for this visit.  Subjective Assessment - 01/04/19 0805    Subjective  Had a fall in the bedroom this week; lost her balance and had to sit down on her buttocks.  Pt was able to get back up.  Brought in wooden walking stick today; 2-3 episodes of forwards LOB when walking from waiting area to treatment gym.    Patient is accompained by:  Family member    Pertinent History  HTN, renal cyst, osteopenia, nystagmus, kidney cysts, iron deficiency anemia, hepatic steatosis, gait abnormality    Limitations  Walking;Standing    Patient Stated Goals  To walk without falling and to walk more normally    Currently in Pain?  No/denies         [facility] PT Assessment - 01/04/19 0806      Standardized Balance Assessment   10 Meter Walk  14.91 seconds with pt's wooden walking stick or 2.2 ft/sec      Berg Balance Test   Sit to Stand  Able to stand  independently using hands    Standing Unsupported  Able to stand 2 minutes with supervision    Sitting with Back Unsupported but Feet Supported on Floor or Stool  Able to sit safely and securely 2 minutes    Stand to Sit  Controls descent by using hands    Transfers  Able to transfer safely, definite need of hands    Standing Unsupported with Eyes Closed  Able to stand 3 seconds    Standing Ubsupported with Feet Together  Needs help to attain position but able to stand for 30 seconds with feet together    From Standing, Reach Forward with Outstretched Arm  Reaches forward but needs supervision    From Standing Position, Pick up Object from [place] to pick up shoe, needs supervision    From Standing Position, Turn to Look Behind Over each Shoulder  Needs supervision when  turning    Turn 360 Degrees  Needs assistance while turning    Standing Unsupported, Alternately Place Feet on Step/Stool  Able to complete >2 steps/needs minimal assist    Standing Unsupported, One Foot in Front  Needs help to step but can hold 15 seconds    Standing on One Leg  Tries to lift leg/unable to hold 3 seconds but remains standing independently    Total Score  27    Berg comment:  27/56         Vestibular Assessment - 01/04/19 0833      Vestibular Assessment   General Observation  significant ataxia; keeps head laterally flexed to R      Symptom Behavior   Type of Dizziness  Unsteady with head/body turns    Frequency of Dizziness  intermittent    Duration of Dizziness  seconds to minutes    Aggravating Factors  Supine to sit;Sit to stand;Mornings    Relieving Factors  Slow movements      Occulomotor Exam   Occulomotor Alignment  Normal    Spontaneous  Absent    Gaze-induced  Direction changing nystagmus    Smooth Pursuits  Saccades    Saccades  Poor trajectory;Slow    Comment  Convergence impaired      Vestibulo-Occular Reflex   VOR to Slow Head Movement  Positive bilaterally    VOR Cancellation  Normal    Comment  HIT: + bilaterally for refixation saccade      Positional Testing   Sidelying Test  Sidelying Right;Sidelying Left      Sidelying Right   Sidelying Right Duration  1-2 seconds    Sidelying Right Symptoms  Downbeat Nystagmus      Sidelying Left   Sidelying Left Duration  1-2 seconds    Sidelying Left Symptoms  Downbeat Nystagmus                       PT Education - 01/04/19 1234    Education Details  results of BERG and vestibular assessment, benefits of aquatic therapy and adding it to program    Person(s) Educated  Patient    Methods  Explanation    Comprehension  Verbalized understanding       PT Short Term Goals - 01/04/19 1236      PT SHORT TERM GOAL #1   Title  Patient will participate in further assessment of  balance and vestibular deficits    Time  4    Period  Weeks    Status  Achieved  Target Date  01/25/19      PT SHORT TERM GOAL #2   Title  Pt will improve five time sit to stand from mat, no UE support to </= 15 seconds but with improved anterior weight shift and decreased posterior LOB    Baseline   15.28 seconds with wdie BOS, no anterior pelvic tilt to shift COG forwards with posterior LOB     Time  4    Period  Weeks    Status  New    Target Date  01/25/19      PT SHORT TERM GOAL #3   Title  Pt will continue to demonstrate gait velocity of >/= 3.9 ft/sec with rollator but with improved upright posture and decreased WB through UE    Baseline  3.88 ft/sec with rollator with flexed posture and R shoulder elevation and uncontrolled forward momentum    Time  4    Period  Weeks    Status  New    Target Date  01/25/19      PT SHORT TERM GOAL #4   Title  Pt will negotiate one step with one UE support with supervision and no posterior LOB    Baseline  min A due to posterior LOB    Time  4    Period  Weeks    Status  New    Target Date  01/25/19      PT SHORT TERM GOAL #5   Title  Pt will ambulate x 500' over pavement and grassy terrain with trekking pole and close supervision    Time  4    Period  Weeks    Status  New    Target Date  01/25/19        PT Long Term Goals - 01/04/19 1236      PT LONG TERM GOAL #1   Title  Pt will demonstrate independence with final HEP    Time  8    Period  Weeks    Status  New    Target Date  02/24/19      PT LONG TERM GOAL #2   Title  Pt will improve five time sit to stand without use of UE to </= 13 seconds and no posterior LOB    Time  8    Period  Weeks    Status  New    Target Date  02/24/19      PT LONG TERM GOAL #3   Title  Pt will improve gait velocity with trekking pole to >/= 2.8 ft/sec with supervision over uneven surfaces (outside on grass)    Baseline  2.2 ft/sec wooden walking stick    Time  8    Period  Weeks     Status  Revised    Target Date  02/24/19      PT LONG TERM GOAL #4   Title  Pt will improve BERG by 4 points to indicate decreased falls risk    Baseline  27/56    Time  8    Period  Weeks    Status  Revised    Target Date  02/24/19      PT LONG TERM GOAL #5   Title  Pt will ambulate x 1000' outside over pavement and grass with trekking pole and supervision and will negotiate one step with one UE support MOD I for home entry/exit    Time  8    Period  Weeks    Status  New    Target Date  02/24/19      PT LONG TERM GOAL #6   Title  Pt will tolerate 1 minute of x1 viewing in standing with wide and narrow BOS with intermittent UE support    Time  8    Period  Weeks    Status  New    Target Date  02/24/19            Plan - 01/04/19 1239    Clinical Impression Statement  Continued assessment of balance and vestibular deficits with BERG balance assessment and vestibular assessment.  Pt does demonstrate high falls risk and central vertigo with significant impairments in oculomotor function, eye coordination and use of VOR with very delayed refixation saccades on head impulse test bilaterally.  Also reassessed gait velocity with patient's walking stick with pt demonstrating slightly slower gait velocity than previously measured.  Will initiate balance and vestibular HEP next session and will proceed with setting pt up with aquatic therapy.    Rehab Potential  Fair    Clinical Impairments Affecting Rehab Potential  deficits appear to be progressive in nature; unclear etiology    PT Frequency  2x / week    PT Duration  8 weeks    PT Treatment/Interventions  ADLs/Self Care Home Management;Aquatic Therapy;DME Instruction;Gait training;Stair training;Functional mobility training;Therapeutic activities;Therapeutic exercise;Balance training;Neuromuscular re-education;Patient/family education;Vestibular;Visual/perceptual remediation/compensation    PT Next Visit Plan  Is she set up for  aquatic?  practice getting down into floor and back up.  Initiate HEP based on BERG and vestibular. Balance on compliant surfaces. Postural exercises (thoracic extension and anterior pelvic tilt to shift weight forwards if possible).    Consulted and Agree with Plan of Care  Patient       Patient will benefit from skilled therapeutic intervention in order to improve the following deficits and impairments:  Abnormal gait, Decreased balance, Decreased coordination, Decreased strength, Difficulty walking, Dizziness, Impaired tone, Impaired UE functional use, Impaired vision/preception, Postural dysfunction  Visit Diagnosis: Other lack of coordination  Ataxic gait  Other abnormalities of gait and mobility  Unsteadiness on feet  Muscle weakness (generalized)     Problem List Patient Active Problem List   Diagnosis Date Noted  . Incontinence of feces   . Visual disturbances 02/06/2017  . Essential hypertension 11/07/2016  . Hyperlipidemia 04/24/2015  . Dysuria 02/11/2015  . Acute cystitis without hematuria 02/11/2015  . Chest pain 02/06/2015  . Left inguinal hernia 08/02/2013  . Gait abnormality 05/14/2013  . Gait disorder   . Nystagmus, end-position   . Routine health maintenance 12/13/2011  . LEG CRAMPS, NOCTURNAL 12/07/2010  . CALLUS, TOE 03/19/2010  . hip pain 03/19/2010  . ATAXIA 11/17/2008  . EMPHYSEMA 10/21/2008  . CERVICAL RADICULOPATHY, RIGHT 10/21/2008  . DIVERTICULOSIS OF COLON 08/14/2008  . RENAL CYST 08/14/2008  . ANEMIA-IRON DEFICIENCY 06/16/2007  . Asthma 06/16/2007  . GERD 06/16/2007  . OSTEOPENIA 06/16/2007    Rico Junker, PT, DPT 01/04/19    8:25 PM    [facility] [address] [place], [place], [address] Phone: [phone]   Fax:  [phone]  Name: Kristine Francis MRN: [id number] Date of Birth: October 11, 1941

## 2019-01-11 ENCOUNTER — Ambulatory Visit: Payer: Medicare Other | Admitting: Physical Therapy

## 2019-01-16 ENCOUNTER — Ambulatory Visit: Payer: Medicare Other | Admitting: Rehabilitative and Restorative Service Providers"

## 2019-01-16 DIAGNOSIS — M6281 Muscle weakness (generalized): Secondary | ICD-10-CM

## 2019-01-16 DIAGNOSIS — R260 Ataxic gait: Secondary | ICD-10-CM | POA: Diagnosis not present

## 2019-01-16 DIAGNOSIS — R2689 Other abnormalities of gait and mobility: Secondary | ICD-10-CM

## 2019-01-16 DIAGNOSIS — R2681 Unsteadiness on feet: Secondary | ICD-10-CM

## 2019-01-16 DIAGNOSIS — R278 Other lack of coordination: Secondary | ICD-10-CM

## 2019-01-16 NOTE — Patient Instructions (Signed)
Access Code: [email or web address]  URL: [email or web address]  Date: 01/16/2019  Prepared by: Rudell Cobb   Exercises Forward Backward Weight Shift with Counter Support - 10 reps - 1 sets - 2x daily - 7x weekly Wide Stance with Eyes Closed - 3 reps - 1 sets - 30 seconds hold - 2x daily - 7x weekly Standing Shoulder Horizontal Abduction - Palms Down - 10 reps - 3 sets - 1x daily - 7x weekly Standing with Forearms Thoracic Rotation - 10 reps - 3 sets - 1x daily - 7x weekly  Compensatory Strategies: Corrective Saccades    1. Holding two stationary targets placed _6_ inches apart, move eyes to target, keep head still. 2. Then move head in direction of target while eyes remain on target. 3/4. Repeat in opposite direction. Perform sitting. Repeat sequence __5__ times per session. Do __2__ sessions per day.  Copyright  VHI. All rights reserved.

## 2019-01-17 NOTE — Therapy (Signed)
[facility] [address] [place], [place], [address] Phone: [phone]   Fax:  [phone]  Physical Therapy Treatment  Patient Details  Name: Kristine Francis MRN: [id number] Date of Birth: [birth year] Referring Provider (PT): Regis Bill, MD   Encounter Date: 01/16/2019  PT End of Session - 01/17/19 1221    Visit Number  3    Number of Visits  17    Date for PT Re-Evaluation  02/24/19    Authorization Type  Medicare Primary; [employer] Secondary (no PTA); 10th visit PN    PT Start Time  1324    PT Stop Time  1404    PT Time Calculation (min)  40 min    Equipment Utilized During Treatment  Gait belt    Activity Tolerance  Patient tolerated treatment well    Behavior During Therapy  WFL for tasks assessed/performed       Past Medical History:  Diagnosis Date  . Allergy   . Asthma    sees Dr. Tiajuana Amass   . Ataxia   . Diverticulosis   . Emphysema   . Gait abnormality 05/14/2013   [name] a patient of Dr. Linda Hedges presented first interpreter thousand 13 with a gait dysfunction, she also had an episodic confusion and Loss device. Exam found a mild nystagmus and she was referred to ophthalmology on 03-2812, brain MRI was normal nystagmus was a secondary diagnosis to vestibulitis, ear nose and throat has followed and had seen the patient. At the time of the nystagmus the patie  . GERD (gastroesophageal reflux disease)   . Hepatic steatosis   . Iron deficiency anemia, unspecified   . Kidney cysts 11/16/12   small cyst on left  . Nystagmus, end-position   . Osteopenia   . Renal cyst     Past Surgical History:  Procedure Laterality Date  . ANAL RECTAL MANOMETRY N/A 12/20/2017   Procedure: ANO RECTAL MANOMETRY;  Surgeon: Mauri Pole, MD;  Location: [facility];  Service: Endoscopy;  Laterality: N/A;  . BREAST BIOPSY Bilateral 1970's  . CATARACT EXTRACTION Bilateral LT:02/14/13,RT:02/21/13  . CERVICAL SPINE  SURGERY  2010  . COLONOSCOPY  03-12-14   per Dr. Olevia Perches, diverticulosis only, repeat 5 yrs   . EYE SURGERY Bilateral 01/2013&02/2013   left then right  . HERNIA REPAIR  Oct '14-left, remote-right   years ago right; left done '14  . SKIN CANCER EXCISION  05/13/13   FACE, basal cell  . TONSILLECTOMY      There were no vitals filed for this visit.  Subjective Assessment - 01/17/19 1158    Subjective  The patient has not fallen this week.  She notes she sits on the ground frequently to do work in the  kitchen (cleaning our cabinets or organizing dishes, etc).    Pertinent History  HTN, renal cyst, osteopenia, nystagmus, kidney cysts, iron deficiency anemia, hepatic steatosis, gait abnormality    Patient Stated Goals  To walk without falling and to walk more normally    Currently in Pain?  No/denies                 [facility] Adult PT Treatment/Exercise - 01/17/19 0001      Ambulation/Gait   Ambulation/Gait  Yes    Ambulation/Gait Assistance  5: Supervision    Ambulation/Gait Assistance Details  The patient brough walking stick to therapy and uses it to create a wide base of support for balance.    Ambulation Distance (Feet)  200 Feet   100 x 3 reps   Assistive device  --   walking stick   Gait Pattern  Step-through pattern;Ataxic;Decreased trunk rotation;Trunk flexed;Wide base of support   maintains hip and knee flexion to lower center of gravity     Therapeutic Activites    Therapeutic Activities  Other Therapeutic Activities    Other Therapeutic Activities  Floor transfers mod indep with UE support on chair, and supervision without using external support (used floor and walked hands up legs).      Neuro Re-ed    Neuro Re-ed Details   Standing near a wall working on wall bumps with controlled posterior and anterior weight shifting.    Worked on awareness of hip strategy to control a posterior lean/falling sensation.  Performed corner balance exercises with eyes open, then eyes  closed.   Performed seated repeated horizontal head turns without significant c/o dizziness.  Performed compensatory saccades movement x 5 reps requiring increased time to perform due to coordination challenges.  *Added this to HEP.      Exercises   Exercises  Other Exercises    Other Exercises   For postural upright and awarness, performed standing at countertop with one UE support and reaching contralateral UE posteriorly for trunk rotation/chest stretch while looking at hand to add visual tracking/habituation.  Performed to each side x 5 reps.  Then performed wall leaning on elbows for trunk rotation reaching under one UE and then rotating (as in doing a wall prop for thread the needle type movement).                 PT Education - 01/17/19 1205    Education Details  HEP provided.    Person(s) Educated  Patient    Methods  Explanation;Demonstration;Handout    Comprehension  Verbalized understanding;Returned demonstration       PT Short Term Goals - 01/04/19 1236      PT SHORT TERM GOAL #1   Title  Patient will participate in further assessment of balance and vestibular deficits    Time  4    Period  Weeks    Status  Achieved    Target Date  01/25/19      PT SHORT TERM GOAL #2   Title  Pt will improve five time sit to stand from mat, no UE support to </= 15 seconds but with improved anterior weight shift and decreased posterior LOB    Baseline   15.28 seconds with wdie BOS, no anterior pelvic tilt to shift COG forwards with posterior LOB     Time  4    Period  Weeks    Status  New    Target Date  01/25/19      PT SHORT TERM GOAL #3   Title  Pt will continue to demonstrate gait velocity of >/= 3.9 ft/sec with rollator but with improved upright posture and decreased WB through UE    Baseline  3.88 ft/sec with rollator with flexed posture and R shoulder elevation and uncontrolled forward momentum    Time  4    Period  Weeks    Status  New    Target Date  01/25/19       PT SHORT TERM GOAL #4   Title  Pt will negotiate one step with one UE support with supervision and no posterior LOB    Baseline  min A due to posterior LOB    Time  4    Period  Weeks    Status  New    Target Date  01/25/19      PT SHORT TERM GOAL #5   Title  Pt will ambulate x 500' over pavement and grassy terrain with trekking pole and close supervision    Time  4    Period  Weeks    Status  New    Target Date  01/25/19        PT Long Term Goals - 01/04/19 1236      PT LONG TERM GOAL #1   Title  Pt will demonstrate independence with final HEP    Time  8    Period  Weeks    Status  New    Target Date  02/24/19      PT LONG TERM GOAL #2   Title  Pt will improve five time sit to stand without use of UE to </= 13 seconds and no posterior LOB    Time  8    Period  Weeks    Status  New    Target Date  02/24/19      PT LONG TERM GOAL #3   Title  Pt will improve gait velocity with trekking pole to >/= 2.8 ft/sec with supervision over uneven surfaces (outside on grass)    Baseline  2.2 ft/sec wooden walking stick    Time  8    Period  Weeks    Status  Revised    Target Date  02/24/19      PT LONG TERM GOAL #4   Title  Pt will improve BERG by 4 points to indicate decreased falls risk    Baseline  27/56    Time  8    Period  Weeks    Status  Revised    Target Date  02/24/19      PT LONG TERM GOAL #5   Title  Pt will ambulate x 1000' outside over pavement and grass with trekking pole and supervision and will negotiate one step with one UE support MOD I for home entry/exit    Time  8    Period  Weeks    Status  New    Target Date  02/24/19      PT LONG TERM GOAL #6   Title  Pt will tolerate 1 minute of x1 viewing in standing with wide and narrow BOS with intermittent UE support    Time  8    Period  Weeks    Status  New    Target Date  02/24/19            Plan - 01/17/19 1237    Clinical Impression Statement  The patient and PT worked on establishing a HEP  focused on balance, posture and compensatory saccades.    She is able to perform floor transfers mod indep and notes she frequently sits on the floor at home.  PT to continue to work towards STGs/LTGs.     PT Treatment/Interventions  ADLs/Self Care Home Management;Aquatic Therapy;DME Instruction;Gait training;Stair training;Functional mobility training;Therapeutic activities;Therapeutic exercise;Balance training;Neuromuscular re-education;Patient/family education;Vestibular;Visual/perceptual remediation/compensation    PT Next Visit Plan  Check HEP, standing balance/vestibular/multi-sensory balance activities, postural exercises, dynamic balance/ balance recovery.    Recommended Other Services  Talk to patient about OT order?    Consulted and Agree with Plan of Care  Patient       Patient will benefit from skilled therapeutic intervention in order to improve the following deficits and impairments:  Abnormal gait, Decreased balance, Decreased coordination, Decreased strength, Difficulty walking, Dizziness, Impaired tone, Impaired UE functional use, Impaired vision/preception, Postural dysfunction  Visit Diagnosis: Other lack of coordination  Ataxic gait  Other abnormalities of gait and mobility  Unsteadiness on feet  Muscle weakness (generalized)     Problem List Patient Active Problem List   Diagnosis Date Noted  . Incontinence of feces   . Visual disturbances 02/06/2017  . Essential hypertension 11/07/2016  . Hyperlipidemia 04/24/2015  . Dysuria 02/11/2015  . Acute cystitis without hematuria 02/11/2015  . Chest pain 02/06/2015  . Left inguinal hernia 08/02/2013  . Gait abnormality 05/14/2013  . Gait disorder   . Nystagmus, end-position   . Routine health maintenance 12/13/2011  . LEG CRAMPS, NOCTURNAL 12/07/2010  . CALLUS, TOE 03/19/2010  . hip pain 03/19/2010  . ATAXIA 11/17/2008  . EMPHYSEMA 10/21/2008  . CERVICAL RADICULOPATHY, RIGHT 10/21/2008  . DIVERTICULOSIS OF  COLON 08/14/2008  . RENAL CYST 08/14/2008  . ANEMIA-IRON DEFICIENCY 06/16/2007  . Asthma 06/16/2007  . GERD 06/16/2007  . OSTEOPENIA 06/16/2007    Roshanda Balazs, PT 01/17/2019, 12:43 PM  [facility] [address] [place], [place], [address] Phone: [phone]   Fax:  [phone]  Name: Kristine Francis MRN: [id number] Date of Birth: [birth year]

## 2019-01-18 ENCOUNTER — Encounter: Payer: Self-pay | Admitting: Rehabilitative and Restorative Service Providers"

## 2019-01-18 ENCOUNTER — Ambulatory Visit: Payer: Medicare Other | Admitting: Rehabilitative and Restorative Service Providers"

## 2019-01-18 DIAGNOSIS — R2681 Unsteadiness on feet: Secondary | ICD-10-CM

## 2019-01-18 DIAGNOSIS — J3089 Other allergic rhinitis: Secondary | ICD-10-CM | POA: Diagnosis not present

## 2019-01-18 DIAGNOSIS — R278 Other lack of coordination: Secondary | ICD-10-CM

## 2019-01-18 DIAGNOSIS — R260 Ataxic gait: Secondary | ICD-10-CM

## 2019-01-18 DIAGNOSIS — R2689 Other abnormalities of gait and mobility: Secondary | ICD-10-CM | POA: Diagnosis not present

## 2019-01-18 DIAGNOSIS — M6281 Muscle weakness (generalized): Secondary | ICD-10-CM

## 2019-01-18 DIAGNOSIS — J301 Allergic rhinitis due to pollen: Secondary | ICD-10-CM | POA: Diagnosis not present

## 2019-01-18 DIAGNOSIS — J3081 Allergic rhinitis due to animal (cat) (dog) hair and dander: Secondary | ICD-10-CM | POA: Diagnosis not present

## 2019-01-18 NOTE — Therapy (Signed)
[facility] [address] [place], [place], [address] Phone: [phone]   Fax:  [phone]  Physical Therapy Treatment  Patient Details  Name: Kristine Francis MRN: [id number] Date of Birth: Mar 28, 1941 Referring Provider (PT): Regis Bill, MD   Encounter Date: 01/18/2019  PT End of Session - 01/18/19 1134    Visit Number  4    Number of Visits  17    Date for PT Re-Evaluation  02/24/19    Authorization Type  Medicare Primary; [employer] Secondary (no PTA); 10th visit PN    PT Start Time  0933    PT Stop Time  1018    PT Time Calculation (min)  45 min    Equipment Utilized During Treatment  Gait belt    Activity Tolerance  Patient tolerated treatment well    Behavior During Therapy  [facility] for tasks assessed/performed       Past Medical History:  Diagnosis Date  . Allergy   . Asthma    sees Dr. Tiajuana Amass   . Ataxia   . Diverticulosis   . Emphysema   . Gait abnormality 05/14/2013   Ms.Loper a patient of Dr. Linda Hedges presented first interpreter thousand 13 with a gait dysfunction, she also had an episodic confusion and Loss device. Exam found a mild nystagmus and she was referred to ophthalmology on 03-2812, brain MRI was normal nystagmus was a secondary diagnosis to vestibulitis, ear nose and throat has followed and had seen the patient. At the time of the nystagmus the patie  . GERD (gastroesophageal reflux disease)   . Hepatic steatosis   . Iron deficiency anemia, unspecified   . Kidney cysts 11/16/12   small cyst on left  . Nystagmus, end-position   . Osteopenia   . Renal cyst     Past Surgical History:  Procedure Laterality Date  . ANAL RECTAL MANOMETRY N/A 12/20/2017   Procedure: ANO RECTAL MANOMETRY;  Surgeon: Mauri Pole, MD;  Location: [facility];  Service: Endoscopy;  Laterality: N/A;  . BREAST BIOPSY Bilateral 1970's  . CATARACT EXTRACTION Bilateral LT:02/14/13,RT:02/21/13  . CERVICAL SPINE  SURGERY  2010  . COLONOSCOPY  03-12-14   per Dr. Olevia Perches, diverticulosis only, repeat 5 yrs   . EYE SURGERY Bilateral 01/2013&02/2013   left then right  . HERNIA REPAIR  Oct '14-left, remote-right   years ago right; left done '14  . SKIN CANCER EXCISION  05/13/13   FACE, basal cell  . TONSILLECTOMY      There were no vitals filed for this visit.  Subjective Assessment - 01/18/19 0934    Subjective  The patient reports she has not done any exercises at home.  She stays busy with other activities at home noting "I'm busy, I'm not sitting watching TV."    Pertinent History  HTN, renal cyst, osteopenia, nystagmus, kidney cysts, iron deficiency anemia, hepatic steatosis, gait abnormality    Patient Stated Goals  To walk without falling and to walk more normally    Currently in Pain?  No/denies                       [facility] Adult PT Treatment/Exercise - 01/18/19 1002      Ambulation/Gait   Ambulation/Gait  Yes    Ambulation/Gait Assistance  5: Supervision;4: Min guard    Ambulation/Gait Assistance Details  PT provides close supervision to [place] for gait with walking stick    Ambulation Distance (Feet)  250  Feet    Assistive device  --   walking stick   Ambulation Surface  Level;Indoor    Gait Comments  Also performed gait without device x 30 feet x 2 reps with CGA.      Neuro Re-ed    Neuro Re-ed Details   Alternating foot taps to 4" step dec'ing UE support in parallel bars; "up/up, down/down" x 5 reps with one UE support.  Rocker board with vertical head motions, alternating UE reaching with min A and cues for postural upright.  Foam standing with eyes open + reaching and eyes open + head motion horizontal plane.  Standing near support performing rotation to reach for cones working on balance, steady state standing working on vertical and horizontal head motion.  Quadriped UE/LE extension with CGA to min A for core stability and balance. Tall kneeling to 1/2 kneeling and tall knee  to heel sit working on hip extension and core stability. Patient requires intermittent UE support and min A for tall kneeling activities.               PT Short Term Goals - 01/04/19 1236      PT SHORT TERM GOAL #1   Title  Patient will participate in further assessment of balance and vestibular deficits    Time  4    Period  Weeks    Status  Achieved    Target Date  01/25/19      PT SHORT TERM GOAL #2   Title  Pt will improve five time sit to stand from mat, no UE support to </= 15 seconds but with improved anterior weight shift and decreased posterior LOB    Baseline   15.28 seconds with wdie BOS, no anterior pelvic tilt to shift COG forwards with posterior LOB     Time  4    Period  Weeks    Status  New    Target Date  01/25/19      PT SHORT TERM GOAL #3   Title  Pt will continue to demonstrate gait velocity of >/= 3.9 ft/sec with rollator but with improved upright posture and decreased WB through UE    Baseline  3.88 ft/sec with rollator with flexed posture and R shoulder elevation and uncontrolled forward momentum    Time  4    Period  Weeks    Status  New    Target Date  01/25/19      PT SHORT TERM GOAL #4   Title  Pt will negotiate one step with one UE support with supervision and no posterior LOB    Baseline  min A due to posterior LOB    Time  4    Period  Weeks    Status  New    Target Date  01/25/19      PT SHORT TERM GOAL #5   Title  Pt will ambulate x 500' over pavement and grassy terrain with trekking pole and close supervision    Time  4    Period  Weeks    Status  New    Target Date  01/25/19        PT Long Term Goals - 01/04/19 1236      PT LONG TERM GOAL #1   Title  Pt will demonstrate independence with final HEP    Time  8    Period  Weeks    Status  New    Target Date  02/24/19  PT LONG TERM GOAL #2   Title  Pt will improve five time sit to stand without use of UE to </= 13 seconds and no posterior LOB    Time  8    Period   Weeks    Status  New    Target Date  02/24/19      PT LONG TERM GOAL #3   Title  Pt will improve gait velocity with trekking pole to >/= 2.8 ft/sec with supervision over uneven surfaces (outside on grass)    Baseline  2.2 ft/sec wooden walking stick    Time  8    Period  Weeks    Status  Revised    Target Date  02/24/19      PT LONG TERM GOAL #4   Title  Pt will improve BERG by 4 points to indicate decreased falls risk    Baseline  27/56    Time  8    Period  Weeks    Status  Revised    Target Date  02/24/19      PT LONG TERM GOAL #5   Title  Pt will ambulate x 1000' outside over pavement and grass with trekking pole and supervision and will negotiate one step with one UE support MOD I for home entry/exit    Time  8    Period  Weeks    Status  New    Target Date  02/24/19      PT LONG TERM GOAL #6   Title  Pt will tolerate 1 minute of x1 viewing in standing with wide and narrow BOS with intermittent UE support    Time  8    Period  Weeks    Status  New    Target Date  02/24/19            Plan - 01/18/19 1154    Clinical Impression Statement  The patient maintains her limit of stability anteriorly (maybe to avoid posterior falls) leading to rounding of shoulders.  PT worked on postural upright, balance strategies, finding midline and dynamic balance activities.  PT discussed OT referral with the patient due to her reports of dec'd ability to write, and difficulties with fine motor tasks.      PT Treatment/Interventions  ADLs/Self Care Home Management;Aquatic Therapy;DME Instruction;Gait training;Stair training;Functional mobility training;Therapeutic activities;Therapeutic exercise;Balance training;Neuromuscular re-education;Patient/family education;Vestibular;Visual/perceptual remediation/compensation    PT Next Visit Plan  Check HEP, standing balance/vestibular/multi-sensory balance activities, postural exercises, dynamic balance/ balance recovery.    Recommended Other  Services  Requesting OT order    Consulted and Agree with Plan of Care  Patient       Patient will benefit from skilled therapeutic intervention in order to improve the following deficits and impairments:  Abnormal gait, Decreased balance, Decreased coordination, Decreased strength, Difficulty walking, Dizziness, Impaired tone, Impaired UE functional use, Impaired vision/preception, Postural dysfunction  Visit Diagnosis: Other abnormalities of gait and mobility  Unsteadiness on feet  Other lack of coordination  Ataxic gait  Muscle weakness (generalized)     Problem List Patient Active Problem List   Diagnosis Date Noted  . Incontinence of feces   . Visual disturbances 02/06/2017  . Essential hypertension 11/07/2016  . Hyperlipidemia 04/24/2015  . Dysuria 02/11/2015  . Acute cystitis without hematuria 02/11/2015  . Chest pain 02/06/2015  . Left inguinal hernia 08/02/2013  . Gait abnormality 05/14/2013  . Gait disorder   . Nystagmus, end-position   . Routine health maintenance 12/13/2011  .  LEG CRAMPS, NOCTURNAL 12/07/2010  . CALLUS, TOE 03/19/2010  . hip pain 03/19/2010  . ATAXIA 11/17/2008  . EMPHYSEMA 10/21/2008  . CERVICAL RADICULOPATHY, RIGHT 10/21/2008  . DIVERTICULOSIS OF COLON 08/14/2008  . RENAL CYST 08/14/2008  . ANEMIA-IRON DEFICIENCY 06/16/2007  . Asthma 06/16/2007  . GERD 06/16/2007  . OSTEOPENIA 06/16/2007    [place] Sorn, PT 01/18/2019, 11:59 AM  [facility] [address] [place], [place], [address] Phone: [phone]   Fax:  [phone]  Name: Kristine RATTERREE MRN: [id number] Date of Birth: Jan 07, 1941

## 2019-01-21 ENCOUNTER — Telehealth: Payer: Self-pay | Admitting: Rehabilitative and Restorative Service Providers"

## 2019-01-21 ENCOUNTER — Ambulatory Visit: Payer: Medicare Other | Attending: Neurology | Admitting: Rehabilitation

## 2019-01-21 ENCOUNTER — Encounter: Payer: Self-pay | Admitting: Rehabilitation

## 2019-01-21 DIAGNOSIS — M6281 Muscle weakness (generalized): Secondary | ICD-10-CM | POA: Diagnosis not present

## 2019-01-21 DIAGNOSIS — R260 Ataxic gait: Secondary | ICD-10-CM | POA: Diagnosis not present

## 2019-01-21 DIAGNOSIS — R2689 Other abnormalities of gait and mobility: Secondary | ICD-10-CM | POA: Diagnosis not present

## 2019-01-21 DIAGNOSIS — R278 Other lack of coordination: Secondary | ICD-10-CM | POA: Diagnosis not present

## 2019-01-21 DIAGNOSIS — R2681 Unsteadiness on feet: Secondary | ICD-10-CM | POA: Diagnosis not present

## 2019-01-21 NOTE — Telephone Encounter (Signed)
Dr. Mickie Bail, Aman Batley was evaluated by PT on 12/26/2018.  She patient would benefit from an OT evaluation for dec'd fine motor, dec'd coordination.   If you agree, please place an order in [facility]que in [facility] or fax the order to [phone]. Thank you, Brandalynn Ofallon, PT

## 2019-01-21 NOTE — Therapy (Signed)
[facility] [address] [place], [place], [address] Phone: [phone]   Fax:  [phone]  Physical Therapy Treatment  Patient Details  Name: Kristine Francis MRN: [id number] Date of Birth: 01/03/1941 Referring Provider (PT): Regis Bill, MD   Encounter Date: 01/21/2019  PT End of Session - 01/21/19 1155    Visit Number  5    Number of Visits  17    Date for PT Re-Evaluation  02/24/19    Authorization Type  Medicare Primary; [employer] Secondary (no PTA); 10th visit PN    PT Start Time  1153   pt late to session   PT Stop Time  1230    PT Time Calculation (min)  37 min    Equipment Utilized During Treatment  Gait belt    Activity Tolerance  Patient tolerated treatment well    Behavior During Therapy  WFL for tasks assessed/performed       Past Medical History:  Diagnosis Date  . Allergy   . Asthma    sees Dr. Tiajuana Amass   . Ataxia   . Diverticulosis   . Emphysema   . Gait abnormality 05/14/2013   [name] a patient of Dr. Linda Hedges presented first interpreter thousand 13 with a gait dysfunction, she also had an episodic confusion and Loss device. Exam found a mild nystagmus and she was referred to ophthalmology on 03-2812, brain MRI was normal nystagmus was a secondary diagnosis to vestibulitis, ear nose and throat has followed and had seen the patient. At the time of the nystagmus the patie  . GERD (gastroesophageal reflux disease)   . Hepatic steatosis   . Iron deficiency anemia, unspecified   . Kidney cysts 11/16/12   small cyst on left  . Nystagmus, end-position   . Osteopenia   . Renal cyst     Past Surgical History:  Procedure Laterality Date  . ANAL RECTAL MANOMETRY N/A 12/20/2017   Procedure: ANO RECTAL MANOMETRY;  Surgeon: Mauri Pole, MD;  Location: [facility];  Service: Endoscopy;  Laterality: N/A;  . BREAST BIOPSY Bilateral 1970's  . CATARACT EXTRACTION Bilateral LT:02/14/13,RT:02/21/13   . CERVICAL SPINE SURGERY  2010  . COLONOSCOPY  03-12-14   per Dr. Olevia Perches, diverticulosis only, repeat 5 yrs   . EYE SURGERY Bilateral 01/2013&02/2013   left then right  . HERNIA REPAIR  Oct '14-left, remote-right   years ago right; left done '14  . SKIN CANCER EXCISION  05/13/13   FACE, basal cell  . TONSILLECTOMY      There were no vitals filed for this visit.  Subjective Assessment - 01/21/19 1154    Subjective  Pt reports no changes since last visit.     Patient is accompained by:  Family member    Pertinent History  HTN, renal cyst, osteopenia, nystagmus, kidney cysts, iron deficiency anemia, hepatic steatosis, gait abnormality    Limitations  Walking;Standing    Patient Stated Goals  To walk without falling and to walk more normally    Currently in Pain?  No/denies                       [facility] Adult PT Treatment/Exercise - 01/21/19 1207      Ambulation/Gait   Ambulation/Gait  Yes    Ambulation/Gait Assistance  5: Supervision;4: Min guard    Ambulation/Gait Assistance Details  PT provides close S to min/guard A with walking stick when ambulating into and out of clinic.  Note that husband provides HHA into waiting area when arriving at clinic today.     Ambulation Distance (Feet)  100 Feet   x 2 reps    Assistive device  --   walking stick   Gait Pattern  Step-through pattern;Ataxic;Decreased trunk rotation;Trunk flexed;Wide base of support    Ambulation Surface  Level;Indoor      High Level Balance   High Level Balance Comments  --      Neuro Re-ed    Neuro Re-ed Details   Tall kneeling activity for improved proximal control and stability.  Placed ball in front for support moving in squats x 10 reps with support then removing support with less range x 10 reps.  Half kneeling with support of ball moving single UE in diagonals x 10 rep with emphasis on upright posture and opening chest x 10 reps on each side (R half knee with R arm moving then L half kneel with  L UE moving).  Then performed quadruped alternating LEs x 10 reps progressing to quadruped alternating UE/LE x 10 reps each with min/guard to min A during task, however pt did demo improvement during session.       Exercises   Exercises  Other Exercises    Other Exercises   Performed exercises at counter top to address stepping strategy, strengthening and posture:  facing counter top performing lateral stepping with outward reach of that UE x 10 reps on each side.  Cues for decreased step as she was unable to push back to midline without several steps.   Also had pt turn sideways at counter top with single UE support stepping forward with upward reach with that UE and then backward stepping with downward reach  x 10 reps and then vice versa.  Pt tolerated well.               PT Education - 01/21/19 1448    Education Details  postural exercises    Person(s) Educated  Patient    Methods  Explanation    Comprehension  Verbalized understanding       PT Short Term Goals - 01/04/19 1236      PT SHORT TERM GOAL #1   Title  Patient will participate in further assessment of balance and vestibular deficits    Time  4    Period  Weeks    Status  Achieved    Target Date  01/25/19      PT SHORT TERM GOAL #2   Title  Pt will improve five time sit to stand from mat, no UE support to </= 15 seconds but with improved anterior weight shift and decreased posterior LOB    Baseline   15.28 seconds with wdie BOS, no anterior pelvic tilt to shift COG forwards with posterior LOB     Time  4    Period  Weeks    Status  New    Target Date  01/25/19      PT SHORT TERM GOAL #3   Title  Pt will continue to demonstrate gait velocity of >/= 3.9 ft/sec with rollator but with improved upright posture and decreased WB through UE    Baseline  3.88 ft/sec with rollator with flexed posture and R shoulder elevation and uncontrolled forward momentum    Time  4    Period  Weeks    Status  New    Target Date   01/25/19      PT SHORT TERM GOAL #  4   Title  Pt will negotiate one step with one UE support with supervision and no posterior LOB    Baseline  min A due to posterior LOB    Time  4    Period  Weeks    Status  New    Target Date  01/25/19      PT SHORT TERM GOAL #5   Title  Pt will ambulate x 500' over pavement and grassy terrain with trekking pole and close supervision    Time  4    Period  Weeks    Status  New    Target Date  01/25/19        PT Long Term Goals - 01/04/19 1236      PT LONG TERM GOAL #1   Title  Pt will demonstrate independence with final HEP    Time  8    Period  Weeks    Status  New    Target Date  02/24/19      PT LONG TERM GOAL #2   Title  Pt will improve five time sit to stand without use of UE to </= 13 seconds and no posterior LOB    Time  8    Period  Weeks    Status  New    Target Date  02/24/19      PT LONG TERM GOAL #3   Title  Pt will improve gait velocity with trekking pole to >/= 2.8 ft/sec with supervision over uneven surfaces (outside on grass)    Baseline  2.2 ft/sec wooden walking stick    Time  8    Period  Weeks    Status  Revised    Target Date  02/24/19      PT LONG TERM GOAL #4   Title  Pt will improve BERG by 4 points to indicate decreased falls risk    Baseline  27/56    Time  8    Period  Weeks    Status  Revised    Target Date  02/24/19      PT LONG TERM GOAL #5   Title  Pt will ambulate x 1000' outside over pavement and grass with trekking pole and supervision and will negotiate one step with one UE support MOD I for home entry/exit    Time  8    Period  Weeks    Status  New    Target Date  02/24/19      PT LONG TERM GOAL #6   Title  Pt will tolerate 1 minute of x1 viewing in standing with wide and narrow BOS with intermittent UE support    Time  8    Period  Weeks    Status  New    Target Date  02/24/19            Plan - 01/21/19 1449    Clinical Impression Statement  Skilled session focused on  strengthening, coordination, proximal stability/control in quadruped, tall kneeling, half kneeling and standing at counter top.  Also addressed postural upright at counter top today with stepping exercises.      PT Treatment/Interventions  ADLs/Self Care Home Management;Aquatic Therapy;DME Instruction;Gait training;Stair training;Functional mobility training;Therapeutic activities;Therapeutic exercise;Balance training;Neuromuscular re-education;Patient/family education;Vestibular;Visual/perceptual remediation/compensation    PT Next Visit Plan  STGs, Check HEP, standing balance/vestibular/multi-sensory balance activities, postural exercises, dynamic balance/ balance recovery.    Consulted and Agree with Plan of Care  Patient       Patient  will benefit from skilled therapeutic intervention in order to improve the following deficits and impairments:  Abnormal gait, Decreased balance, Decreased coordination, Decreased strength, Difficulty walking, Dizziness, Impaired tone, Impaired UE functional use, Impaired vision/preception, Postural dysfunction  Visit Diagnosis: Other abnormalities of gait and mobility  Unsteadiness on feet  Other lack of coordination  Ataxic gait     Problem List Patient Active Problem List   Diagnosis Date Noted  . Incontinence of feces   . Visual disturbances 02/06/2017  . Essential hypertension 11/07/2016  . Hyperlipidemia 04/24/2015  . Dysuria 02/11/2015  . Acute cystitis without hematuria 02/11/2015  . Chest pain 02/06/2015  . Left inguinal hernia 08/02/2013  . Gait abnormality 05/14/2013  . Gait disorder   . Nystagmus, end-position   . Routine health maintenance 12/13/2011  . LEG CRAMPS, NOCTURNAL 12/07/2010  . CALLUS, TOE 03/19/2010  . hip pain 03/19/2010  . ATAXIA 11/17/2008  . EMPHYSEMA 10/21/2008  . CERVICAL RADICULOPATHY, RIGHT 10/21/2008  . DIVERTICULOSIS OF COLON 08/14/2008  . RENAL CYST 08/14/2008  . ANEMIA-IRON DEFICIENCY 06/16/2007  .  Asthma 06/16/2007  . GERD 06/16/2007  . OSTEOPENIA 06/16/2007    Kristine Francis, PT, MPT [facility] [address] [place], [place], [address] Phone: [phone]   Fax:  [phone] 01/21/19, 2:52 PM  Name: Kristine Francis MRN: [id number] Date of Birth: Nov 07, 1941

## 2019-01-25 ENCOUNTER — Ambulatory Visit: Payer: Medicare Other | Admitting: Physical Therapy

## 2019-01-29 ENCOUNTER — Ambulatory Visit: Payer: Medicare Other | Admitting: Rehabilitation

## 2019-01-29 ENCOUNTER — Encounter: Payer: Self-pay | Admitting: Rehabilitation

## 2019-01-29 DIAGNOSIS — R2681 Unsteadiness on feet: Secondary | ICD-10-CM

## 2019-01-29 DIAGNOSIS — R2689 Other abnormalities of gait and mobility: Secondary | ICD-10-CM | POA: Diagnosis not present

## 2019-01-29 DIAGNOSIS — R278 Other lack of coordination: Secondary | ICD-10-CM | POA: Diagnosis not present

## 2019-01-29 DIAGNOSIS — M6281 Muscle weakness (generalized): Secondary | ICD-10-CM

## 2019-01-29 DIAGNOSIS — R260 Ataxic gait: Secondary | ICD-10-CM | POA: Diagnosis not present

## 2019-01-29 NOTE — Patient Instructions (Signed)
°  Aquatic Therapy: What to Expect! ° °Where:  °[facility] °[address] °[place], [place]  [address] °[phone] ° °NOTE:  You will receive an automated phone message reminding you of your appointment and it will say the appointment is at the Re[facility] on [address]  We are working to fix this- just know that you will meet us at the pool! ° °How to Prepare: °• Please make sure you drink 8 ounces of water about one hour prior to your pool session °• A caregiver MUST attend the entire session with the patient.  The caregiver will be responsible for assisting with dressing as well as any toileting needs.  If the patient will be doing a home program this should likely be the person who will assist as well.  °• Patients must wear either their street shoes or pool shoes until they are ready to enter the pool with the therapist.  Patients must also wear either street shoes or pool shoes once exiting the pool to walk to the locker room.  This will helps us prevent slips and falls.  °• Please arrive 15 minutes early to prepare for your pool therapy session °• Sign in at the front desk on the clipboard marked for [place] °• You may use the locker rooms on your right and then enter directly into the recreation pool (NOT the competition pool) °• Please make sure to attend to any toileting needs prior to entering the pool °• Please be dressed in your swim suit and on the pool deck at least 5 minutes before your appointment °• Once on the pool deck your therapist will ask you to sign the Patient  Consent and Assignment of Benefits form °• Your therapist may take your blood pressure prior to, during and after your session if indicated ° °About the pool  and parking: °1. Entering the pool °Your therapist will assist you; there are multiple ways to enter including stairs with railings, a walk in ramp, a roll in chair and a mechanical lift. Your therapist will determine the most appropriate way for  you. °2. Water temperature is usually between 86-87 degrees °3. There may be other swimmers in the pool at the same time °4. Parking is free: if you arrive and there is a parking attendant please inform them you are there for therapy and do not pay to park. Handicapped parking is located at the front entrance. ° °Contact Info:     Appointments: °[facility]  All sessions are 45 minutes   °[address]  [address]     Please call the [facility] if   °[place],    [address]    you need to cancel or reschedule an appointment.  °[phone]     ° ° ° ° °

## 2019-01-29 NOTE — Therapy (Signed)
[facility] [address] [place], [place], [address] Phone: [phone]   Fax:  [phone]  Physical Therapy Treatment  Patient Details  Name: Kristine Francis MRN: [id number] Date of Birth: 30-Mar-1941 Referring Provider (PT): Regis Bill, MD   Encounter Date: 01/29/2019  PT End of Session - 01/29/19 1241    Visit Number  6    Number of Visits  17    Date for PT Re-Evaluation  02/24/19    Authorization Type  Medicare Primary; [employer] Secondary (no PTA); 10th visit PN    PT Start Time  1038   pt late to session   PT Stop Time  1110    PT Time Calculation (min)  32 min    Equipment Utilized During Treatment  Gait belt    Activity Tolerance  Patient tolerated treatment well    Behavior During Therapy  WFL for tasks assessed/performed       Past Medical History:  Diagnosis Date  . Allergy   . Asthma    sees Dr. Tiajuana Amass   . Ataxia   . Diverticulosis   . Emphysema   . Gait abnormality 05/14/2013   Ms.Tello a patient of Dr. Linda Hedges presented first interpreter thousand 13 with a gait dysfunction, she also had an episodic confusion and Loss device. Exam found a mild nystagmus and she was referred to ophthalmology on 03-2812, brain MRI was normal nystagmus was a secondary diagnosis to vestibulitis, ear nose and throat has followed and had seen the patient. At the time of the nystagmus the patie  . GERD (gastroesophageal reflux disease)   . Hepatic steatosis   . Iron deficiency anemia, unspecified   . Kidney cysts 11/16/12   small cyst on left  . Nystagmus, end-position   . Osteopenia   . Renal cyst     Past Surgical History:  Procedure Laterality Date  . ANAL RECTAL MANOMETRY N/A 12/20/2017   Procedure: ANO RECTAL MANOMETRY;  Surgeon: Mauri Pole, MD;  Location: [facility];  Service: Endoscopy;  Laterality: N/A;  . BREAST BIOPSY Bilateral 1970's  . CATARACT EXTRACTION Bilateral LT:02/14/13,RT:02/21/13   . CERVICAL SPINE SURGERY  2010  . COLONOSCOPY  03-12-14   per Dr. Olevia Perches, diverticulosis only, repeat 5 yrs   . EYE SURGERY Bilateral 01/2013&02/2013   left then right  . HERNIA REPAIR  Oct '14-left, remote-right   years ago right; left done '14  . SKIN CANCER EXCISION  05/13/13   FACE, basal cell  . TONSILLECTOMY      There were no vitals filed for this visit.                    [facility] PT Treatment/Exercise - 01/29/19 1045      Transfers   Transfers  Sit to Stand;Stand to Sit    Sit to Stand  6: Modified independent (Device/Increase time)    Five time sit to stand comments   12 secs without UE support with decreased posterior LOB, improved forward weight shift.      Stand to Sit  6: Modified independent (Device/Increase time)      Ambulation/Gait   Ambulation/Gait  Yes    Ambulation/Gait Assistance  5: Supervision;4: Min guard    Ambulation/Gait Assistance Details  Pt mostly S level for outdoor gait on unlevel paved surfaces, however she did require intermittent min/guard over grassy surfaces with trekking pole.  Note BOS also increases over grassy areas and she tends to  sway to the R.     Ambulation Distance (Feet)  500 Feet   outdoors, 100' indoors with trekking pole and then rollator   Assistive device  4-wheeled walker   and trekking pole   Gait Pattern  Step-through pattern;Ataxic;Decreased trunk rotation;Trunk flexed;Wide base of support    Ambulation Surface  Level;Indoor;Unlevel;Outdoor;Grass;Paved    Gait velocity  3.48 ft/sec with rollator with improved posture and more relaxed shoulders throughout.      Curb  5: Supervision    Curb Details (indicate cue type and reason)  Pt performed x 2 reps during session with trekking pole for support with close S and cues for sequencing and step through pattern to avoid posterior LOB.         Self Care:  Discussed aquatic PT session for tomorrow and provided what to expect handout.  Pt and husband verbalized  understanding.      PT Education - 01/29/19 1238    Education Details  progress towards goals, aquatic PT    Person(s) Educated  Patient    Methods  Explanation    Comprehension  Verbalized understanding       PT Short Term Goals - 01/29/19 1040      PT SHORT TERM GOAL #1   Title  Patient will participate in further assessment of balance and vestibular deficits    Time  4    Period  Weeks    Status  Achieved    Target Date  01/25/19      PT SHORT TERM GOAL #2   Title  Pt will improve five time sit to stand from mat, no UE support to </= 15 seconds but with improved anterior weight shift and decreased posterior LOB    Baseline  12 secs without posterior LOB, narrower BOS (still slightly wide), and improved forward weight shift.     Time  4    Period  Weeks    Status  Achieved    Target Date  01/25/19      PT SHORT TERM GOAL #3   Title  Pt will continue to demonstrate gait velocity of >/= 3.9 ft/sec with rollator but with improved upright posture and decreased WB through UE    Baseline  3.48 ft/sec with rollator but with improved posture and decreased shoulder elevation 01/29/19    Time  4    Period  Weeks    Status  Partially Met    Target Date  01/25/19      PT SHORT TERM GOAL #4   Title  Pt will negotiate one step with one UE support with supervision and no posterior LOB    Baseline  Close S for safety with walking stick and cues for for step through technique.  No posterior LOB    Time  4    Period  Weeks    Status  Achieved    Target Date  01/25/19      PT SHORT TERM GOAL #5   Title  Pt will ambulate x 500' over pavement and grassy terrain with trekking pole and close supervision    Baseline  mostly S level, however over grass provided intermittent min/guard for safety.     Time  4    Period  Weeks    Status  Partially Met    Target Date  01/25/19        PT Long Term Goals - 01/04/19 1236      PT LONG TERM GOAL #1  Title  Pt will demonstrate independence  with final HEP    Time  8    Period  Weeks    Status  New    Target Date  02/24/19      PT LONG TERM GOAL #2   Title  Pt will improve five time sit to stand without use of UE to </= 13 seconds and no posterior LOB    Time  8    Period  Weeks    Status  New    Target Date  02/24/19      PT LONG TERM GOAL #3   Title  Pt will improve gait velocity with trekking pole to >/= 2.8 ft/sec with supervision over uneven surfaces (outside on grass)    Baseline  2.2 ft/sec wooden walking stick    Time  8    Period  Weeks    Status  Revised    Target Date  02/24/19      PT LONG TERM GOAL #4   Title  Pt will improve BERG by 4 points to indicate decreased falls risk    Baseline  27/56    Time  8    Period  Weeks    Status  Revised    Target Date  02/24/19      PT LONG TERM GOAL #5   Title  Pt will ambulate x 1000' outside over pavement and grass with trekking pole and supervision and will negotiate one step with one UE support MOD I for home entry/exit    Time  8    Period  Weeks    Status  New    Target Date  02/24/19      PT LONG TERM GOAL #6   Title  Pt will tolerate 1 minute of x1 viewing in standing with wide and narrow BOS with intermittent UE support    Time  8    Period  Weeks    Status  New    Target Date  02/24/19            Plan - 01/29/19 1241    Clinical Impression Statement  Skilled session focused on assessment of STGs.  Pt has met 3/5 STGs, partially meeting goal for gait speed with improved posture and outdoor gait goal with her trekking pole as she requires intmittent min/guard over grassy surfaces.      PT Treatment/Interventions  ADLs/Self Care Home Management;Aquatic Therapy;DME Instruction;Gait training;Stair training;Functional mobility training;Therapeutic activities;Therapeutic exercise;Balance training;Neuromuscular re-education;Patient/family education;Vestibular;Visual/perceptual remediation/compensation    PT Next Visit Plan  Check HEP, standing  balance/vestibular/multi-sensory balance activities, postural exercises, dynamic balance/ balance recovery.    Consulted and Agree with Plan of Care  Patient       Patient will benefit from skilled therapeutic intervention in order to improve the following deficits and impairments:  Abnormal gait, Decreased balance, Decreased coordination, Decreased strength, Difficulty walking, Dizziness, Impaired tone, Impaired UE functional use, Impaired vision/preception, Postural dysfunction  Visit Diagnosis: Other abnormalities of gait and mobility  Unsteadiness on feet  Other lack of coordination  Muscle weakness (generalized)     Problem List Patient Active Problem List   Diagnosis Date Noted  . Incontinence of feces   . Visual disturbances 02/06/2017  . Essential hypertension 11/07/2016  . Hyperlipidemia 04/24/2015  . Dysuria 02/11/2015  . Acute cystitis without hematuria 02/11/2015  . Chest pain 02/06/2015  . Left inguinal hernia 08/02/2013  . Gait abnormality 05/14/2013  . Gait disorder   . Nystagmus, end-position   .  Routine health maintenance 12/13/2011  . LEG CRAMPS, NOCTURNAL 12/07/2010  . CALLUS, TOE 03/19/2010  . hip pain 03/19/2010  . ATAXIA 11/17/2008  . EMPHYSEMA 10/21/2008  . CERVICAL RADICULOPATHY, RIGHT 10/21/2008  . DIVERTICULOSIS OF COLON 08/14/2008  . RENAL CYST 08/14/2008  . ANEMIA-IRON DEFICIENCY 06/16/2007  . Asthma 06/16/2007  . GERD 06/16/2007  . OSTEOPENIA 06/16/2007    Cameron Sprang, PT, MPT [facility] [address] [place], [place], [address] Phone: [phone]   Fax:  [phone] 01/29/19, 12:49 PM  Name: Kristine Francis MRN: [id number] Date of Birth: Nov 16, 1941

## 2019-01-30 ENCOUNTER — Ambulatory Visit: Payer: Medicare Other | Admitting: Physical Therapy

## 2019-02-01 ENCOUNTER — Ambulatory Visit: Payer: Medicare Other | Admitting: Physical Therapy

## 2019-02-01 DIAGNOSIS — R278 Other lack of coordination: Secondary | ICD-10-CM | POA: Diagnosis not present

## 2019-02-01 DIAGNOSIS — R2681 Unsteadiness on feet: Secondary | ICD-10-CM | POA: Diagnosis not present

## 2019-02-01 DIAGNOSIS — R2689 Other abnormalities of gait and mobility: Secondary | ICD-10-CM | POA: Diagnosis not present

## 2019-02-01 DIAGNOSIS — M6281 Muscle weakness (generalized): Secondary | ICD-10-CM | POA: Diagnosis not present

## 2019-02-01 DIAGNOSIS — G629 Polyneuropathy, unspecified: Secondary | ICD-10-CM | POA: Insufficient documentation

## 2019-02-01 DIAGNOSIS — J301 Allergic rhinitis due to pollen: Secondary | ICD-10-CM | POA: Diagnosis not present

## 2019-02-01 DIAGNOSIS — R260 Ataxic gait: Secondary | ICD-10-CM | POA: Diagnosis not present

## 2019-02-01 DIAGNOSIS — J3089 Other allergic rhinitis: Secondary | ICD-10-CM | POA: Diagnosis not present

## 2019-02-01 DIAGNOSIS — J3081 Allergic rhinitis due to animal (cat) (dog) hair and dander: Secondary | ICD-10-CM | POA: Diagnosis not present

## 2019-02-02 ENCOUNTER — Encounter: Payer: Self-pay | Admitting: Physical Therapy

## 2019-02-02 NOTE — Therapy (Signed)
[facility] [address] [place], [place], [address] Phone: [phone]   Fax:  [phone]  Physical Therapy Treatment  Patient Details  Name: Kristine Francis MRN: [id number] Date of Birth: 1941-10-01 Referring Provider (PT): Regis Bill, MD   Encounter Date: 02/01/2019  PT End of Session - 02/01/19 1700    Visit Number  7    Number of Visits  17    Date for PT Re-Evaluation  02/24/19    Authorization Type  Medicare Primary; [employer] Secondary (no PTA); 10th visit PN    PT Start Time  1030   pt was a "work-in" due to scheduling misunderstanding (by pt)   PT Stop Time  1102    PT Time Calculation (min)  32 min    Activity Tolerance  Patient tolerated treatment well    Behavior During Therapy  [facility] for tasks assessed/performed       Past Medical History:  Diagnosis Date  . Allergy   . Asthma    sees Dr. Tiajuana Amass   . Ataxia   . Diverticulosis   . Emphysema   . Gait abnormality 05/14/2013   Ms.Wolfrey a patient of Dr. Linda Hedges presented first interpreter thousand 13 with a gait dysfunction, she also had an episodic confusion and Loss device. Exam found a mild nystagmus and she was referred to ophthalmology on 03-2812, brain MRI was normal nystagmus was a secondary diagnosis to vestibulitis, ear nose and throat has followed and had seen the patient. At the time of the nystagmus the patie  . GERD (gastroesophageal reflux disease)   . Hepatic steatosis   . Iron deficiency anemia, unspecified   . Kidney cysts 11/16/12   small cyst on left  . Nystagmus, end-position   . Osteopenia   . Renal cyst     Past Surgical History:  Procedure Laterality Date  . ANAL RECTAL MANOMETRY N/A 12/20/2017   Procedure: ANO RECTAL MANOMETRY;  Surgeon: Mauri Pole, MD;  Location: [facility];  Service: Endoscopy;  Laterality: N/A;  . BREAST BIOPSY Bilateral 1970's  . CATARACT EXTRACTION Bilateral LT:02/14/13,RT:02/21/13  .  CERVICAL SPINE SURGERY  2010  . COLONOSCOPY  03-12-14   per Dr. Olevia Perches, diverticulosis only, repeat 5 yrs   . EYE SURGERY Bilateral 01/2013&02/2013   left then right  . HERNIA REPAIR  Oct '14-left, remote-right   years ago right; left done '14  . SKIN CANCER EXCISION  05/13/13   FACE, basal cell  . TONSILLECTOMY      There were no vitals filed for this visit.  Subjective Assessment - 02/01/19 1700    Subjective  Pt reports no changes since last visit. Thought she had an appointment today because it was on the original print-out of her appointments.     Patient is accompained by:  Family member    Pertinent History  HTN, renal cyst, osteopenia, nystagmus, kidney cysts, iron deficiency anemia, hepatic steatosis, gait abnormality    Limitations  Walking;Standing    Patient Stated Goals  To walk without falling and to walk more normally    Currently in Pain?  No/denies       TREATMENT  Self care--see clinical impression and education re: aquatic therapy and HEP  There-ex: see pt instructions for exercises completed for education for pt's HEP.                        PT Education - 02/01/19 1700  Education Details  aquatic PT handout; education on correct form with HEP    Person(s) Educated  Patient    Methods  Explanation;Demonstration;Tactile cues;Verbal cues;Handout    Comprehension  Verbalized understanding;Returned demonstration;Verbal cues required;Tactile cues required;Need further instruction       PT Short Term Goals - 01/29/19 1040      PT SHORT TERM GOAL #1   Title  Patient will participate in further assessment of balance and vestibular deficits    Time  4    Period  Weeks    Status  Achieved    Target Date  01/25/19      PT SHORT TERM GOAL #2   Title  Pt will improve five time sit to stand from mat, no UE support to </= 15 seconds but with improved anterior weight shift and decreased posterior LOB    Baseline  12 secs without posterior LOB,  narrower BOS (still slightly wide), and improved forward weight shift.     Time  4    Period  Weeks    Status  Achieved    Target Date  01/25/19      PT SHORT TERM GOAL #3   Title  Pt will continue to demonstrate gait velocity of >/= 3.9 ft/sec with rollator but with improved upright posture and decreased WB through UE    Baseline  3.48 ft/sec with rollator but with improved posture and decreased shoulder elevation 01/29/19    Time  4    Period  Weeks    Status  Partially Met    Target Date  01/25/19      PT SHORT TERM GOAL #4   Title  Pt will negotiate one step with one UE support with supervision and no posterior LOB    Baseline  Close S for safety with walking stick and cues for for step through technique.  No posterior LOB    Time  4    Period  Weeks    Status  Achieved    Target Date  01/25/19      PT SHORT TERM GOAL #5   Title  Pt will ambulate x 500' over pavement and grassy terrain with trekking pole and close supervision    Baseline  mostly S level, however over grass provided intermittent min/guard for safety.     Time  4    Period  Weeks    Status  Partially Met    Target Date  01/25/19        PT Long Term Goals - 01/04/19 1236      PT LONG TERM GOAL #1   Title  Pt will demonstrate independence with final HEP    Time  8    Period  Weeks    Status  New    Target Date  02/24/19      PT LONG TERM GOAL #2   Title  Pt will improve five time sit to stand without use of UE to </= 13 seconds and no posterior LOB    Time  8    Period  Weeks    Status  New    Target Date  02/24/19      PT LONG TERM GOAL #3   Title  Pt will improve gait velocity with trekking pole to >/= 2.8 ft/sec with supervision over uneven surfaces (outside on grass)    Baseline  2.2 ft/sec wooden walking stick    Time  8    Period  Weeks  Status  Revised    Target Date  02/24/19      PT LONG TERM GOAL #4   Title  Pt will improve BERG by 4 points to indicate decreased falls risk     Baseline  27/56    Time  8    Period  Weeks    Status  Revised    Target Date  02/24/19      PT LONG TERM GOAL #5   Title  Pt will ambulate x 1000' outside over pavement and grass with trekking pole and supervision and will negotiate one step with one UE support MOD I for home entry/exit    Time  8    Period  Weeks    Status  New    Target Date  02/24/19      PT LONG TERM GOAL #6   Title  Pt will tolerate 1 minute of x1 viewing in standing with wide and narrow BOS with intermittent UE support    Time  8    Period  Weeks    Status  New    Target Date  02/24/19            Plan - 02/01/19 1700    Clinical Impression Statement  Patient arrived for an appointment that was previously cancelled by her primary PT. The appointment had been cancelled due to scheduling her for aquatic therapy on 01/30/19. Patient did not go to that appointment and thought she could still come today. Patient seemed very confused re: where aquatic therapy takes place ([facility] She was provided another Aquatic Therapy pt information handout (this PT did not see that she had previously been given this handout) and she clearly had no recollection of the handout or instructions she had been given. Remainder of session (shortened as she was "worked in") focused on review of her HEP. She had no understanding that she was to be doing these exercises at home. She did recall the handout she was provided when shown the pictures, but did not understand that she was to do these exercises at home. Instructed in exercises from [employer] packet, however did not have time to practice compensatory saccades from [facility] handout. Anticipate patient will need a caregiver to be educated on how to properly do the HEP in order to assist her with the program at home. Will follow-up with primary PT and aquatic PT.     PT Frequency  2x / week    PT Duration  8 weeks    PT Treatment/Interventions   ADLs/Self Care Home Management;Aquatic Therapy;DME Instruction;Gait training;Stair training;Functional mobility training;Therapeutic activities;Therapeutic exercise;Balance training;Neuromuscular re-education;Patient/family education;Vestibular;Visual/perceptual remediation/compensation    PT Next Visit Plan  Check HEP (again--see if any better understanding or recall), chk with Suzanne about aquatic therapy appropriateness due to decr memory; standing balance/vestibular/multi-sensory balance activities, postural exercises, dynamic balance/ balance recovery.    PT Home Exercise Plan  [email or web address]; plus compensatory saccades from [facility]    Consulted and Agree with Plan of Care  Patient       Patient will benefit from skilled therapeutic intervention in order to improve the following deficits and impairments:  Abnormal gait, Decreased balance, Decreased coordination, Decreased strength, Difficulty walking, Dizziness, Impaired tone, Impaired UE functional use, Impaired vision/preception, Postural dysfunction  Visit Diagnosis: Unsteadiness on feet  Muscle weakness (generalized)     Problem List Patient Active Problem List   Diagnosis Date Noted  . Incontinence of   feces   . Visual disturbances 02/06/2017  . Essential hypertension 11/07/2016  . Hyperlipidemia 04/24/2015  . Dysuria 02/11/2015  . Acute cystitis without hematuria 02/11/2015  . Chest pain 02/06/2015  . Left inguinal hernia 08/02/2013  . Gait abnormality 05/14/2013  . Gait disorder   . Nystagmus, end-position   . Routine health maintenance 12/13/2011  . LEG CRAMPS, NOCTURNAL 12/07/2010  . CALLUS, TOE 03/19/2010  . hip pain 03/19/2010  . ATAXIA 11/17/2008  . EMPHYSEMA 10/21/2008  . CERVICAL RADICULOPATHY, RIGHT 10/21/2008  . DIVERTICULOSIS OF COLON 08/14/2008  . RENAL CYST 08/14/2008  . ANEMIA-IRON DEFICIENCY 06/16/2007  . Asthma 06/16/2007  . GERD 06/16/2007  . OSTEOPENIA 06/16/2007     P , PT 02/02/2019, 8:51  AM  [facility] [address] , [place], [address] Phone: [phone]   Fax:  [phone]  Name: Illana H Hord MRN: [id number] Date of Birth: 10/17/1941   

## 2019-02-02 NOTE — Patient Instructions (Signed)
Access Code: [email or web address]  URL: [email or web address]  Date: 01/16/2019  Prepared by: Barry Brunner   Exercises  Forward Backward Weight Shift with Counter Support - 10 reps - 1 sets - 2x daily - 7x weekly  Wide Stance with Eyes Closed - 3 reps - 1 sets - 30 seconds hold - 2x daily - 7x weekly  Standing Shoulder Horizontal Abduction - Palms Down - 10 reps - 3 sets - 1x daily - 7x weekly  Standing with Forearms Thoracic Rotation - 10 reps - 3 sets - 1x daily - 7x weekly

## 2019-02-05 ENCOUNTER — Ambulatory Visit: Payer: Medicare Other | Admitting: Rehabilitation

## 2019-02-08 ENCOUNTER — Ambulatory Visit: Payer: Medicare Other | Admitting: Physical Therapy

## 2019-02-11 ENCOUNTER — Telehealth: Payer: Self-pay | Admitting: Rehabilitative and Restorative Service Providers"

## 2019-02-11 NOTE — Telephone Encounter (Signed)
Kristine Francis was contacted today regarding the temporary closing of [facility] due to Covid-19.  Therapist discussed: patient is self quarantining.  She is not doing exercises.  PT encouraged her to develop a routine with HEP.    Patient is not interested in further information for an e-visit, virtual check in, or telehealth visit, if those services become available.    [facility] will follow up with patients when we are able to resume care.  [place] [place] [address] [place], [place]  [address] Phone:  [phone] Fax:  [phone]

## 2019-02-12 ENCOUNTER — Ambulatory Visit: Payer: Medicare Other | Admitting: Physical Therapy

## 2019-02-15 ENCOUNTER — Encounter: Payer: Medicare Other | Admitting: Rehabilitative and Restorative Service Providers"

## 2019-02-19 ENCOUNTER — Encounter: Payer: Medicare Other | Admitting: Physical Therapy

## 2019-02-22 ENCOUNTER — Encounter: Payer: Medicare Other | Admitting: Physical Therapy

## 2019-03-27 DIAGNOSIS — Z9181 History of falling: Secondary | ICD-10-CM | POA: Insufficient documentation

## 2019-03-29 ENCOUNTER — Ambulatory Visit (INDEPENDENT_AMBULATORY_CARE_PROVIDER_SITE_OTHER): Payer: Medicare Other | Admitting: Family Medicine

## 2019-03-29 ENCOUNTER — Encounter: Payer: Self-pay | Admitting: Family Medicine

## 2019-03-29 ENCOUNTER — Encounter: Payer: Self-pay | Admitting: Gastroenterology

## 2019-03-29 ENCOUNTER — Other Ambulatory Visit: Payer: Self-pay

## 2019-03-29 DIAGNOSIS — N390 Urinary tract infection, site not specified: Secondary | ICD-10-CM | POA: Diagnosis not present

## 2019-03-29 MED ORDER — NITROFURANTOIN MONOHYD MACRO 100 MG PO CAPS: 100.0000 mg | ORAL_CAPSULE | Freq: Two times a day (BID) | ORAL | 0 refills | Status: DC

## 2019-03-29 NOTE — Progress Notes (Signed)
   Subjective:    Patient ID: Kristine Francis, female    DOB: March 03, 1941, 78 y.o.   MRN: [id number]  HPI Virtual Visit via Telephone Note  I connected with the patient on 03/29/19 at  9:00 AM EDT by telephone and verified that I am speaking with the correct person using two identifiers. We attempted to spaek via [email or web address] but we had technical difficulties with the audio and video.    I discussed the limitations, risks, security and privacy concerns of performing an evaluation and management service by telephone and the availability of in person appointments. I also discussed with the patient that there may be a patient responsible charge related to this service. The patient expressed understanding and agreed to proceed.  Location patient: home Location provider: work or home office Participants present for the call: patient, provider Patient did not have a visit in the prior 7 days to address this/these issue(s).   History of Present Illness: For one week she has had urinary burning and urgency. No back pain or nausea or fever. drinking lots of water.    Observations/Objective: Patient sounds cheerful and well on the phone. I do not appreciate any SOB. Speech and thought processing are grossly intact. Patient reported vitals:  Assessment and Plan: UTI, treat with Macrobid.  [name], MD   Follow Up Instructions:     [phone] [phone] 21-30 I did not refer this patient for an OV in the next 24 hours for this/these issue(s).  I discussed the assessment and treatment plan with the patient. The patient was provided an opportunity to ask questions and all were answered. The patient agreed with the plan and demonstrated an understanding of the instructions.   The patient was advised to call back or seek an in-person evaluation if the symptoms worsen or if the condition fails to improve as anticipated.  I provided 11 minutes of non-face-to-face time during this  encounter.   [name], MD    Review of Systems     Objective:   Physical Exam        Assessment & Plan:

## 2019-04-12 ENCOUNTER — Ambulatory Visit (AMBULATORY_SURGERY_CENTER): Payer: Self-pay | Admitting: *Deleted

## 2019-04-12 ENCOUNTER — Other Ambulatory Visit: Payer: Self-pay

## 2019-04-12 VITALS — Ht 61.0 in | Wt 95.0 lb

## 2019-04-12 DIAGNOSIS — Z800 Family history of malignant neoplasm of digestive organs: Secondary | ICD-10-CM

## 2019-04-12 MED ORDER — NA SULFATE-K SULFATE-MG SULF 17.5-3.13-1.6 GM/177ML PO SOLN: 1.0000 | Freq: Once | ORAL | 0 refills | Status: AC

## 2019-04-12 NOTE — Progress Notes (Signed)
No egg or soy allergy known to patient  No issues with past sedation with any surgeries  or procedures, no intubation problems  No diet pills per patient No home 02 use per patient  No blood thinners per patient  Pt denies issues with constipation  No A fib or A flutter  EMMI video sent to pt's e mail    Pt mailed instruction packet to included paper to complete and mail back to [facility] with addressed and stamped envelope, Emmi video, copy of consent form to read and not return, and instructions. PV completed over the phone. Pt encouraged to call with questions or issues

## 2019-04-23 ENCOUNTER — Other Ambulatory Visit: Payer: Self-pay

## 2019-04-23 ENCOUNTER — Ambulatory Visit (INDEPENDENT_AMBULATORY_CARE_PROVIDER_SITE_OTHER): Payer: Medicare Other | Admitting: Family Medicine

## 2019-04-23 ENCOUNTER — Encounter: Payer: Self-pay | Admitting: Family Medicine

## 2019-04-23 DIAGNOSIS — N390 Urinary tract infection, site not specified: Secondary | ICD-10-CM | POA: Diagnosis not present

## 2019-04-23 MED ORDER — NITROFURANTOIN MONOHYD MACRO 100 MG PO CAPS: 100.0000 mg | ORAL_CAPSULE | Freq: Two times a day (BID) | ORAL | 0 refills | Status: DC

## 2019-04-23 NOTE — Progress Notes (Signed)
   Subjective:    Patient ID: Kristine Francis, female    DOB: 1941/10/05, 78 y.o.   MRN: [id number]  HPI Virtual Visit via Telephone Note  I connected with the patient on 04/23/19 at  2:45 PM EDT by telephone and verified that I am speaking with the correct person using two identifiers. We attempted to connect virtually but we had technical difficulties with the audio and video.    I discussed the limitations, risks, security and privacy concerns of performing an evaluation and management service by telephone and the availability of in person appointments. I also discussed with the patient that there may be a patient responsible charge related to this service. The patient expressed understanding and agreed to proceed.  Location patient: home Location provider: work or home office Participants present for the call: patient, provider Patient did not have a visit in the prior 7 days to address this/these issue(s).   History of Present Illness: She thinks here UTI has come back. We saw her one month ago for urinary burning and urgency, and we gave her Macrobid. This worked well for her, but in the past 3 days these symptoms have returned. No fever.    Observations/Objective: Patient sounds cheerful and well on the phone. I do not appreciate any SOB. Speech and thought processing are grossly intact. Patient reported vitals:  Assessment and Plan: UTI, treat with 10 days of Macrobid. Drink lots of water.  [name], MD   Follow Up Instructions:     [phone] 11-20 [phone] 21-30 I did not refer this patient for an OV in the next 24 hours for this/these issue(s).  I discussed the assessment and treatment plan with the patient. The patient was provided an opportunity to ask questions and all were answered. The patient agreed with the plan and demonstrated an understanding of the instructions.   The patient was advised to call back or seek an in-person evaluation if the symptoms  worsen or if the condition fails to improve as anticipated.  I provided 10 minutes of non-face-to-face time during this encounter.   [name], MD    Review of Systems     Objective:   Physical Exam        Assessment & Plan:

## 2019-04-24 ENCOUNTER — Telehealth: Payer: Self-pay | Admitting: *Deleted

## 2019-04-24 NOTE — Telephone Encounter (Signed)
Covid-19 screening questions  Have you traveled in the last 14 days? no If yes where?  Do you now or have you had a fever in the last 14 days? no  Do you have any respiratory symptoms of shortness of breath or cough now or in the last 14 days? no  Do you have any family members or close contacts with diagnosed or suspected Covid-19 in the past 14 days? no  Have you been tested for Covid-19 and found to be positive? noPt is aware that care partner will wait in the car during parking lot; if they feel like they will be too hot to wait in the car; they may wait in the lobby.  We want them to wear a mask (we do not have any that we can provide them), practice social distancing, and we will check their temperatures when they get here.  I did remind patient that their care partner needs to stay in the parking lot the entire time. Pt will wear mask into building       

## 2019-04-26 ENCOUNTER — Other Ambulatory Visit: Payer: Self-pay

## 2019-04-26 ENCOUNTER — Encounter: Payer: Self-pay | Admitting: Gastroenterology

## 2019-04-26 ENCOUNTER — Ambulatory Visit (AMBULATORY_SURGERY_CENTER): Payer: Medicare Other | Admitting: Gastroenterology

## 2019-04-26 VITALS — BP 179/98 | HR 77 | Temp 97.5°F | Resp 11 | Ht 61.0 in | Wt 88.0 lb

## 2019-04-26 DIAGNOSIS — Z800 Family history of malignant neoplasm of digestive organs: Secondary | ICD-10-CM | POA: Diagnosis not present

## 2019-04-26 DIAGNOSIS — Z1211 Encounter for screening for malignant neoplasm of colon: Secondary | ICD-10-CM

## 2019-04-26 MED ORDER — SODIUM CHLORIDE 0.9 % IV SOLN
500.0000 mL | Freq: Once | INTRAVENOUS | Status: DC
Start: 2019-04-26 — End: 2019-04-26

## 2019-04-26 NOTE — Op Note (Signed)
[name] Procedure Date: 04/26/2019 10:59 AM MRN: [id number] Endoscopist: Mauri Pole , MD Age: 78 Referring MD:  Date of Birth: 1941/03/13 Gender: Female Account #: [id number] Procedure:                Colonoscopy Indications:              Screening in patient at increased risk: Family                            history of 1st-degree relative with colorectal                            cancer Medicines:                Monitored Anesthesia Care Procedure:                Pre-Anesthesia Assessment:                           - Prior to the procedure, a History and Physical                            was performed, and patient medications and                            allergies were reviewed. The patient's tolerance of                            previous anesthesia was also reviewed. The risks                            and benefits of the procedure and the sedation                            options and risks were discussed with the patient.                            All questions were answered, and informed consent                            was obtained. Prior Anticoagulants: The patient has                            taken no previous anticoagulant or antiplatelet                            agents. ASA Grade Assessment: III - A patient with                            severe systemic disease. After reviewing the risks                            and benefits, the patient was deemed in  satisfactory condition to undergo the procedure.                           After obtaining informed consent, the colonoscope                            was passed under direct vision. Throughout the                            procedure, the patient's blood pressure, pulse, and                            oxygen saturations were monitored continuously. The                            Colonoscope was introduced through the anus and                            advanced to the the cecum, identified by                            appendiceal orifice and ileocecal valve. The                            colonoscopy was performed without difficulty. The                            patient tolerated the procedure well. The quality                            of the bowel preparation was fair. The ileocecal                            valve, appendiceal orifice, and rectum were                            photographed. Scope In: 11:10:01 AM Scope Out: 11:29:31 AM Scope Withdrawal Time: 0 hours 10 minutes 29 seconds  Total Procedure Duration: 0 hours 19 minutes 30 seconds  Findings:                 The digital rectal exam findings include decreased                            sphincter tone.                           Scattered small and large-mouthed diverticula were                            found in the sigmoid colon and descending colon.                            There was narrowing of the colon in association  with the diverticular opening.                           External and internal hemorrhoids were found during                            endoscopy, unable to perform retroflexion due to                            poor sphincter tone, doesnt retain air and also                            narrow retal vault. The hemorrhoids were small. Complications:            No immediate complications. Estimated Blood Loss:     Estimated blood loss was minimal. Impression:               - Preparation of the colon was fair.                           - Decreased sphincter tone found on digital rectal                            exam.                           - Severe diverticulosis in the sigmoid colon and in                            the descending colon. There was narrowing of the                            colon in association with the diverticular opening.                           - External and internal  hemorrhoids.                           - No specimens collected. Recommendation:           - Patient has a contact number available for                            emergencies. The signs and symptoms of potential                            delayed complications were discussed with the                            patient. Return to normal activities tomorrow.                            Written discharge instructions were provided to the                            patient.                           -  Resume previous diet.                           - Continue present medications.                           - No repeat colonoscopy due to age.                           - Return to GI clinic PRN. Mauri Pole, MD 04/26/2019 11:34:32 AM This report has been signed electronically.

## 2019-04-26 NOTE — Patient Instructions (Signed)
   Information on diverticulosis & hemorrhoids given to you today   YOU HAD AN ENDOSCOPIC PROCEDURE TODAY AT [facility]:   Refer to the procedure report that was given to you for any specific questions about what was found during the examination.  If the procedure report does not answer your questions, please call your gastroenterologist to clarify.  If you requested that your care partner not be given the details of your procedure findings, then the procedure report has been included in a sealed envelope for you to review at your convenience later.  YOU SHOULD EXPECT: Some feelings of bloating in the abdomen. Passage of more gas than usual.  Walking can help get rid of the air that was put into your GI tract during the procedure and reduce the bloating. If you had a lower endoscopy (such as a colonoscopy or flexible sigmoidoscopy) you may notice spotting of blood in your stool or on the toilet paper. If you underwent a bowel prep for your procedure, you may not have a normal bowel movement for a few days.  Please Note:  You might notice some irritation and congestion in your nose or some drainage.  This is from the oxygen used during your procedure.  There is no need for concern and it should clear up in a day or so.  SYMPTOMS TO REPORT IMMEDIATELY:   Following lower endoscopy (colonoscopy or flexible sigmoidoscopy):  Excessive amounts of blood in the stool  Significant tenderness or worsening of abdominal pains  Swelling of the abdomen that is new, acute  Fever of 100F or higher    For urgent or emergent issues, a gastroenterologist can be reached at any hour by calling [phone].   DIET:  We do recommend a small meal at first, but then you may proceed to your regular diet.  Drink plenty of fluids but you should avoid alcoholic beverages for 24 hours.  ACTIVITY:  You should plan to take it easy for the rest of today and you should NOT DRIVE or use heavy machinery  until tomorrow (because of the sedation medicines used during the test).    FOLLOW UP: Our staff will call the number listed on your records 48-72 hours following your procedure to check on you and address any questions or concerns that you may have regarding the information given to you following your procedure. If we do not reach you, we will leave a message.  We will attempt to reach you two times.  During this call, we will ask if you have developed any symptoms of COVID 19. If you develop any symptoms (ie: fever, flu-like symptoms, shortness of breath, cough etc.) before then, please call [phone].  If you test positive for Covid 19 in the 2 weeks post procedure, please call and report this information to [place].    If any biopsies were taken you will be contacted by phone or by letter within the next 1-3 weeks.  Please call us at [phone] if you have not heard about the biopsies in 3 weeks.    SIGNATURES/CONFIDENTIALITY: You and/or your care partner have signed paperwork which will be entered into your electronic medical record.  These signatures attest to the fact that that the information above on your After Visit Summary has been reviewed and is understood.  Full responsibility of the confidentiality of this discharge information lies with you and/or your care-partner.

## 2019-04-26 NOTE — Progress Notes (Signed)
A/ox3 pleased with MAC, report to 

## 2019-04-29 ENCOUNTER — Ambulatory Visit: Payer: Medicare Other | Attending: Neurology | Admitting: Physical Therapy

## 2019-04-29 DIAGNOSIS — H52203 Unspecified astigmatism, bilateral: Secondary | ICD-10-CM | POA: Diagnosis not present

## 2019-04-29 DIAGNOSIS — Z961 Presence of intraocular lens: Secondary | ICD-10-CM | POA: Diagnosis not present

## 2019-04-29 DIAGNOSIS — H524 Presbyopia: Secondary | ICD-10-CM | POA: Diagnosis not present

## 2019-04-30 ENCOUNTER — Telehealth: Payer: Self-pay | Admitting: *Deleted

## 2019-04-30 NOTE — Telephone Encounter (Signed)
  Follow up Call-  Call back number 04/26/2019  Post procedure Call Back phone  # [phone]  Permission to leave phone message Yes  Some recent data might be hidden     Patient questions:  Do you have a fever, pain , or abdominal swelling? No. Pain Score  0 *  Have you tolerated food without any problems? Yes.    Have you been able to return to your normal activities? Yes.    Do you have any questions about your discharge instructions: Diet   No. Medications  No. Follow up visit  No.  Do you have questions or concerns about your Care? No.  Actions: * If pain score is 4 or above: No action needed, pain <4.   1. Have you developed a fever since your procedure? no  2.   Have you had an respiratory symptoms (SOB or cough) since your procedure? no  3.   Have you tested positive for COVID 19 since your procedure no  4.   Have you had any family members/close contacts diagnosed with the COVID 19 since your procedure?  no   If yes to any of these questions please route to Joylene John, RN and Alphonsa Gin, Therapist, sports.

## 2019-05-21 ENCOUNTER — Encounter: Payer: Self-pay | Admitting: Family Medicine

## 2019-05-21 DIAGNOSIS — R921 Mammographic calcification found on diagnostic imaging of breast: Secondary | ICD-10-CM | POA: Diagnosis not present

## 2019-06-21 ENCOUNTER — Other Ambulatory Visit: Payer: Self-pay

## 2019-07-03 DIAGNOSIS — S81802A Unspecified open wound, left lower leg, initial encounter: Secondary | ICD-10-CM | POA: Diagnosis not present

## 2019-07-03 DIAGNOSIS — L0889 Other specified local infections of the skin and subcutaneous tissue: Secondary | ICD-10-CM | POA: Diagnosis not present

## 2019-08-06 DIAGNOSIS — L905 Scar conditions and fibrosis of skin: Secondary | ICD-10-CM | POA: Diagnosis not present

## 2019-08-13 DIAGNOSIS — J3089 Other allergic rhinitis: Secondary | ICD-10-CM | POA: Diagnosis not present

## 2019-08-13 DIAGNOSIS — J301 Allergic rhinitis due to pollen: Secondary | ICD-10-CM | POA: Diagnosis not present

## 2019-08-13 DIAGNOSIS — J3081 Allergic rhinitis due to animal (cat) (dog) hair and dander: Secondary | ICD-10-CM | POA: Diagnosis not present

## 2019-08-16 DIAGNOSIS — J3089 Other allergic rhinitis: Secondary | ICD-10-CM | POA: Diagnosis not present

## 2019-08-16 DIAGNOSIS — J301 Allergic rhinitis due to pollen: Secondary | ICD-10-CM | POA: Diagnosis not present

## 2019-08-16 DIAGNOSIS — J3081 Allergic rhinitis due to animal (cat) (dog) hair and dander: Secondary | ICD-10-CM | POA: Diagnosis not present

## 2019-08-19 ENCOUNTER — Other Ambulatory Visit: Payer: Self-pay

## 2019-08-20 ENCOUNTER — Encounter: Payer: Self-pay | Admitting: Family Medicine

## 2019-08-20 ENCOUNTER — Ambulatory Visit (INDEPENDENT_AMBULATORY_CARE_PROVIDER_SITE_OTHER): Payer: Medicare Other | Admitting: Family Medicine

## 2019-08-20 VITALS — BP 140/88 | HR 84 | Temp 97.8°F | Ht 61.0 in | Wt 81.0 lb

## 2019-08-20 DIAGNOSIS — Z23 Encounter for immunization: Secondary | ICD-10-CM | POA: Diagnosis not present

## 2019-08-20 DIAGNOSIS — J3089 Other allergic rhinitis: Secondary | ICD-10-CM | POA: Diagnosis not present

## 2019-08-20 DIAGNOSIS — R350 Frequency of micturition: Secondary | ICD-10-CM | POA: Diagnosis not present

## 2019-08-20 DIAGNOSIS — N390 Urinary tract infection, site not specified: Secondary | ICD-10-CM | POA: Diagnosis not present

## 2019-08-20 DIAGNOSIS — J301 Allergic rhinitis due to pollen: Secondary | ICD-10-CM | POA: Diagnosis not present

## 2019-08-20 DIAGNOSIS — J3081 Allergic rhinitis due to animal (cat) (dog) hair and dander: Secondary | ICD-10-CM | POA: Diagnosis not present

## 2019-08-20 LAB — POCT URINALYSIS DIPSTICK
Bilirubin, UA: NEGATIVE
Blood, UA: NEGATIVE
Glucose, UA: NEGATIVE
Ketones, UA: NEGATIVE
Leukocytes, UA: NEGATIVE
Nitrite, UA: POSITIVE
Protein, UA: NEGATIVE
Spec Grav, UA: 1.025 (ref 1.010–1.025)
Urobilinogen, UA: 0.2 E.U./dL
pH, UA: 6 (ref 5.0–8.0)

## 2019-08-20 MED ORDER — NITROFURANTOIN MONOHYD MACRO 100 MG PO CAPS: 100.0000 mg | ORAL_CAPSULE | Freq: Two times a day (BID) | ORAL | 0 refills | Status: DC

## 2019-08-20 NOTE — Progress Notes (Signed)
   Subjective:    Patient ID: MYKIA VANDENBOSCH, female    DOB: 1941/06/13, 78 y.o.   MRN: [id number]  HPI Here for 3 days of urgency to urinate. No burning or pain. No fever.    Review of Systems  Constitutional: Negative.   Respiratory: Negative.   Cardiovascular: Negative.   Gastrointestinal: Negative.   Genitourinary: Positive for frequency and urgency. Negative for dysuria, flank pain and hematuria.       Objective:   Physical Exam Constitutional:      Appearance: Normal appearance.  Cardiovascular:     Rate and Rhythm: Normal rate and regular rhythm.     Pulses: Normal pulses.     Heart sounds: Normal heart sounds.  Pulmonary:     Effort: Pulmonary effort is normal.     Breath sounds: Normal breath sounds.  Abdominal:     General: Abdomen is flat. Bowel sounds are normal. There is no distension.     Palpations: Abdomen is soft. There is no mass.     Tenderness: There is no abdominal tenderness. There is no guarding or rebound.     Hernia: No hernia is present.  Neurological:     Mental Status: She is alert.           Assessment & Plan:  UTI, treat with Macrobid. Drink lots of water. Culture the sample.  [name], MD

## 2019-08-22 LAB — URINE CULTURE
MICRO NUMBER:: 934272
SPECIMEN QUALITY:: ADEQUATE

## 2019-08-23 DIAGNOSIS — J3089 Other allergic rhinitis: Secondary | ICD-10-CM | POA: Diagnosis not present

## 2019-08-23 DIAGNOSIS — J301 Allergic rhinitis due to pollen: Secondary | ICD-10-CM | POA: Diagnosis not present

## 2019-08-23 DIAGNOSIS — J3081 Allergic rhinitis due to animal (cat) (dog) hair and dander: Secondary | ICD-10-CM | POA: Diagnosis not present

## 2019-08-26 ENCOUNTER — Telehealth: Payer: Self-pay

## 2019-08-26 NOTE — Telephone Encounter (Signed)
Copied from [place] [phone]. Topic: General - Other >> Aug 22, 2019  3:51 PM Kristine Francis wrote: Reason for CRM: Patient is returning [place] call. Pt call back [phone]

## 2019-08-26 NOTE — Telephone Encounter (Signed)
Called pt no answer. Called pt to go over lab results. CRM will be created!

## 2019-08-27 DIAGNOSIS — J301 Allergic rhinitis due to pollen: Secondary | ICD-10-CM | POA: Diagnosis not present

## 2019-08-27 DIAGNOSIS — J3081 Allergic rhinitis due to animal (cat) (dog) hair and dander: Secondary | ICD-10-CM | POA: Diagnosis not present

## 2019-08-27 DIAGNOSIS — J3089 Other allergic rhinitis: Secondary | ICD-10-CM | POA: Diagnosis not present

## 2019-08-30 DIAGNOSIS — J301 Allergic rhinitis due to pollen: Secondary | ICD-10-CM | POA: Diagnosis not present

## 2019-08-30 DIAGNOSIS — J3081 Allergic rhinitis due to animal (cat) (dog) hair and dander: Secondary | ICD-10-CM | POA: Diagnosis not present

## 2019-08-30 DIAGNOSIS — J3089 Other allergic rhinitis: Secondary | ICD-10-CM | POA: Diagnosis not present

## 2019-09-06 DIAGNOSIS — J301 Allergic rhinitis due to pollen: Secondary | ICD-10-CM | POA: Diagnosis not present

## 2019-09-06 DIAGNOSIS — J3081 Allergic rhinitis due to animal (cat) (dog) hair and dander: Secondary | ICD-10-CM | POA: Diagnosis not present

## 2019-09-06 DIAGNOSIS — J3089 Other allergic rhinitis: Secondary | ICD-10-CM | POA: Diagnosis not present

## 2019-09-10 DIAGNOSIS — J3081 Allergic rhinitis due to animal (cat) (dog) hair and dander: Secondary | ICD-10-CM | POA: Diagnosis not present

## 2019-09-10 DIAGNOSIS — J3089 Other allergic rhinitis: Secondary | ICD-10-CM | POA: Diagnosis not present

## 2019-09-10 DIAGNOSIS — J301 Allergic rhinitis due to pollen: Secondary | ICD-10-CM | POA: Diagnosis not present

## 2019-09-11 ENCOUNTER — Telehealth: Payer: Self-pay

## 2019-09-11 NOTE — Telephone Encounter (Signed)
Copied from [place] [id number]. Topic: General - Other >> Sep 11, 2019  4:07 PM Pauline Good wrote: Reason for CRM: pt want to know If she has had a pneumonia vaccine

## 2019-09-13 DIAGNOSIS — J3089 Other allergic rhinitis: Secondary | ICD-10-CM | POA: Diagnosis not present

## 2019-09-13 DIAGNOSIS — J3081 Allergic rhinitis due to animal (cat) (dog) hair and dander: Secondary | ICD-10-CM | POA: Diagnosis not present

## 2019-09-13 DIAGNOSIS — J301 Allergic rhinitis due to pollen: Secondary | ICD-10-CM | POA: Diagnosis not present

## 2019-09-13 NOTE — Telephone Encounter (Signed)
Pt has had both pneumonia vaccines. Will inform pt Monday when I return to office.

## 2019-09-17 DIAGNOSIS — J3081 Allergic rhinitis due to animal (cat) (dog) hair and dander: Secondary | ICD-10-CM | POA: Diagnosis not present

## 2019-09-17 DIAGNOSIS — J301 Allergic rhinitis due to pollen: Secondary | ICD-10-CM | POA: Diagnosis not present

## 2019-09-17 DIAGNOSIS — J3089 Other allergic rhinitis: Secondary | ICD-10-CM | POA: Diagnosis not present

## 2019-09-17 NOTE — Telephone Encounter (Signed)
Called pt 2x no answer. Crm created!

## 2019-09-20 DIAGNOSIS — J301 Allergic rhinitis due to pollen: Secondary | ICD-10-CM | POA: Diagnosis not present

## 2019-09-20 DIAGNOSIS — J3081 Allergic rhinitis due to animal (cat) (dog) hair and dander: Secondary | ICD-10-CM | POA: Diagnosis not present

## 2019-09-20 DIAGNOSIS — J3089 Other allergic rhinitis: Secondary | ICD-10-CM | POA: Diagnosis not present

## 2019-09-24 DIAGNOSIS — J301 Allergic rhinitis due to pollen: Secondary | ICD-10-CM | POA: Diagnosis not present

## 2019-09-24 DIAGNOSIS — J3081 Allergic rhinitis due to animal (cat) (dog) hair and dander: Secondary | ICD-10-CM | POA: Diagnosis not present

## 2019-09-24 DIAGNOSIS — J3089 Other allergic rhinitis: Secondary | ICD-10-CM | POA: Diagnosis not present

## 2019-09-27 DIAGNOSIS — J301 Allergic rhinitis due to pollen: Secondary | ICD-10-CM | POA: Diagnosis not present

## 2019-09-27 DIAGNOSIS — J3089 Other allergic rhinitis: Secondary | ICD-10-CM | POA: Diagnosis not present

## 2019-09-27 DIAGNOSIS — J3081 Allergic rhinitis due to animal (cat) (dog) hair and dander: Secondary | ICD-10-CM | POA: Diagnosis not present

## 2019-09-30 DIAGNOSIS — J3081 Allergic rhinitis due to animal (cat) (dog) hair and dander: Secondary | ICD-10-CM | POA: Diagnosis not present

## 2019-09-30 DIAGNOSIS — J301 Allergic rhinitis due to pollen: Secondary | ICD-10-CM | POA: Diagnosis not present

## 2019-09-30 DIAGNOSIS — J3089 Other allergic rhinitis: Secondary | ICD-10-CM | POA: Diagnosis not present

## 2019-10-01 DIAGNOSIS — J3081 Allergic rhinitis due to animal (cat) (dog) hair and dander: Secondary | ICD-10-CM | POA: Diagnosis not present

## 2019-10-01 DIAGNOSIS — J301 Allergic rhinitis due to pollen: Secondary | ICD-10-CM | POA: Diagnosis not present

## 2019-10-01 DIAGNOSIS — J3089 Other allergic rhinitis: Secondary | ICD-10-CM | POA: Diagnosis not present

## 2019-10-04 DIAGNOSIS — J3081 Allergic rhinitis due to animal (cat) (dog) hair and dander: Secondary | ICD-10-CM | POA: Diagnosis not present

## 2019-10-04 DIAGNOSIS — J301 Allergic rhinitis due to pollen: Secondary | ICD-10-CM | POA: Diagnosis not present

## 2019-10-04 DIAGNOSIS — J3089 Other allergic rhinitis: Secondary | ICD-10-CM | POA: Diagnosis not present

## 2019-10-08 DIAGNOSIS — J301 Allergic rhinitis due to pollen: Secondary | ICD-10-CM | POA: Diagnosis not present

## 2019-10-08 DIAGNOSIS — J3089 Other allergic rhinitis: Secondary | ICD-10-CM | POA: Diagnosis not present

## 2019-10-08 DIAGNOSIS — J3081 Allergic rhinitis due to animal (cat) (dog) hair and dander: Secondary | ICD-10-CM | POA: Diagnosis not present

## 2019-10-14 DIAGNOSIS — J3089 Other allergic rhinitis: Secondary | ICD-10-CM | POA: Diagnosis not present

## 2019-10-14 DIAGNOSIS — J301 Allergic rhinitis due to pollen: Secondary | ICD-10-CM | POA: Diagnosis not present

## 2019-10-14 DIAGNOSIS — J3081 Allergic rhinitis due to animal (cat) (dog) hair and dander: Secondary | ICD-10-CM | POA: Diagnosis not present

## 2019-10-16 DIAGNOSIS — J301 Allergic rhinitis due to pollen: Secondary | ICD-10-CM | POA: Diagnosis not present

## 2019-10-16 DIAGNOSIS — J3081 Allergic rhinitis due to animal (cat) (dog) hair and dander: Secondary | ICD-10-CM | POA: Diagnosis not present

## 2019-10-16 DIAGNOSIS — J3089 Other allergic rhinitis: Secondary | ICD-10-CM | POA: Diagnosis not present

## 2019-10-22 ENCOUNTER — Telehealth: Payer: Self-pay | Admitting: *Deleted

## 2019-10-22 DIAGNOSIS — J301 Allergic rhinitis due to pollen: Secondary | ICD-10-CM | POA: Diagnosis not present

## 2019-10-22 DIAGNOSIS — J3081 Allergic rhinitis due to animal (cat) (dog) hair and dander: Secondary | ICD-10-CM | POA: Diagnosis not present

## 2019-10-22 DIAGNOSIS — J3089 Other allergic rhinitis: Secondary | ICD-10-CM | POA: Diagnosis not present

## 2019-10-22 NOTE — Telephone Encounter (Signed)
Copied from [place] [phone]. Topic: General - Other >> Oct 22, 2019  3:53 PM Leward Quan A wrote: Reason for CRM: Patient called to say that she came in for an allergy shot that was given in her car but she is not sure who is in charge of scheduling these appointments. She is asking for a call back with some information on whether she should come in for another shot next week can be reached at Ph# [phone]

## 2019-10-25 DIAGNOSIS — J3081 Allergic rhinitis due to animal (cat) (dog) hair and dander: Secondary | ICD-10-CM | POA: Diagnosis not present

## 2019-10-25 DIAGNOSIS — J3089 Other allergic rhinitis: Secondary | ICD-10-CM | POA: Diagnosis not present

## 2019-10-25 DIAGNOSIS — J301 Allergic rhinitis due to pollen: Secondary | ICD-10-CM | POA: Diagnosis not present

## 2019-10-28 ENCOUNTER — Telehealth: Payer: Self-pay

## 2019-10-28 NOTE — Telephone Encounter (Signed)
Copied from [place] [phone]. Topic: General - Other >> Oct 28, 2019 10:31 AM Parke Poisson wrote: Reason for CRM: Pt wants to know if it is safe for her to ger her allergy injections. She doesn't think she has had any since covid has been around

## 2019-11-05 NOTE — Telephone Encounter (Signed)
Left message to return phone call.

## 2019-11-05 NOTE — Telephone Encounter (Signed)
This message was accidentally missed. NO sure what happened. I called pt to see if she still needed her questions or concerns answered. PT did not answer. Will call again later.

## 2019-12-03 DIAGNOSIS — J301 Allergic rhinitis due to pollen: Secondary | ICD-10-CM | POA: Diagnosis not present

## 2019-12-03 DIAGNOSIS — J3089 Other allergic rhinitis: Secondary | ICD-10-CM | POA: Diagnosis not present

## 2019-12-03 DIAGNOSIS — J3081 Allergic rhinitis due to animal (cat) (dog) hair and dander: Secondary | ICD-10-CM | POA: Diagnosis not present

## 2019-12-05 DIAGNOSIS — J3089 Other allergic rhinitis: Secondary | ICD-10-CM | POA: Diagnosis not present

## 2019-12-05 DIAGNOSIS — J301 Allergic rhinitis due to pollen: Secondary | ICD-10-CM | POA: Diagnosis not present

## 2019-12-05 DIAGNOSIS — J3081 Allergic rhinitis due to animal (cat) (dog) hair and dander: Secondary | ICD-10-CM | POA: Diagnosis not present

## 2019-12-10 DIAGNOSIS — J301 Allergic rhinitis due to pollen: Secondary | ICD-10-CM | POA: Diagnosis not present

## 2019-12-10 DIAGNOSIS — J3081 Allergic rhinitis due to animal (cat) (dog) hair and dander: Secondary | ICD-10-CM | POA: Diagnosis not present

## 2019-12-10 DIAGNOSIS — J3089 Other allergic rhinitis: Secondary | ICD-10-CM | POA: Diagnosis not present

## 2019-12-17 DIAGNOSIS — J3081 Allergic rhinitis due to animal (cat) (dog) hair and dander: Secondary | ICD-10-CM | POA: Diagnosis not present

## 2019-12-17 DIAGNOSIS — J3089 Other allergic rhinitis: Secondary | ICD-10-CM | POA: Diagnosis not present

## 2019-12-17 DIAGNOSIS — J301 Allergic rhinitis due to pollen: Secondary | ICD-10-CM | POA: Diagnosis not present

## 2019-12-24 DIAGNOSIS — J3081 Allergic rhinitis due to animal (cat) (dog) hair and dander: Secondary | ICD-10-CM | POA: Diagnosis not present

## 2019-12-24 DIAGNOSIS — J3089 Other allergic rhinitis: Secondary | ICD-10-CM | POA: Diagnosis not present

## 2019-12-24 DIAGNOSIS — J301 Allergic rhinitis due to pollen: Secondary | ICD-10-CM | POA: Diagnosis not present

## 2019-12-31 DIAGNOSIS — J3081 Allergic rhinitis due to animal (cat) (dog) hair and dander: Secondary | ICD-10-CM | POA: Diagnosis not present

## 2019-12-31 DIAGNOSIS — J301 Allergic rhinitis due to pollen: Secondary | ICD-10-CM | POA: Diagnosis not present

## 2019-12-31 DIAGNOSIS — J3089 Other allergic rhinitis: Secondary | ICD-10-CM | POA: Diagnosis not present

## 2020-01-10 DIAGNOSIS — J3081 Allergic rhinitis due to animal (cat) (dog) hair and dander: Secondary | ICD-10-CM | POA: Diagnosis not present

## 2020-01-10 DIAGNOSIS — J3089 Other allergic rhinitis: Secondary | ICD-10-CM | POA: Diagnosis not present

## 2020-01-10 DIAGNOSIS — J301 Allergic rhinitis due to pollen: Secondary | ICD-10-CM | POA: Diagnosis not present

## 2020-01-14 DIAGNOSIS — J301 Allergic rhinitis due to pollen: Secondary | ICD-10-CM | POA: Diagnosis not present

## 2020-01-14 DIAGNOSIS — J3081 Allergic rhinitis due to animal (cat) (dog) hair and dander: Secondary | ICD-10-CM | POA: Diagnosis not present

## 2020-01-14 DIAGNOSIS — J3089 Other allergic rhinitis: Secondary | ICD-10-CM | POA: Diagnosis not present

## 2020-01-16 ENCOUNTER — Ambulatory Visit: Payer: Self-pay

## 2020-01-19 ENCOUNTER — Ambulatory Visit: Payer: Medicare Other | Attending: Internal Medicine

## 2020-01-19 DIAGNOSIS — Z23 Encounter for immunization: Secondary | ICD-10-CM

## 2020-01-19 NOTE — Progress Notes (Signed)
   Covid-19 Vaccination Clinic  Name:  [name]    MRN: [id number] DOB: 1941/04/30  01/19/2020  [name] was observed post Covid-19 immunization for 15 minutes without incidence. She was provided with Vaccine Information Sheet and instruction to access the V-Safe system.   [name] was instructed to call 911 with any severe reactions post vaccine: Marland Kitchen Difficulty breathing  . Swelling of your face and throat  . A fast heartbeat  . A bad rash all over your body  . Dizziness and weakness    Immunizations Administered    Name Date Dose VIS Date Route   Pfizer COVID-19 Vaccine 01/19/2020 12:58 PM 0.3 mL 11/01/2019 Intramuscular   Manufacturer: [place]   Lot: [id number]   [place]: [id number]

## 2020-01-31 DIAGNOSIS — J3089 Other allergic rhinitis: Secondary | ICD-10-CM | POA: Diagnosis not present

## 2020-01-31 DIAGNOSIS — J3081 Allergic rhinitis due to animal (cat) (dog) hair and dander: Secondary | ICD-10-CM | POA: Diagnosis not present

## 2020-01-31 DIAGNOSIS — J301 Allergic rhinitis due to pollen: Secondary | ICD-10-CM | POA: Diagnosis not present

## 2020-02-14 DIAGNOSIS — J301 Allergic rhinitis due to pollen: Secondary | ICD-10-CM | POA: Diagnosis not present

## 2020-02-14 DIAGNOSIS — J3081 Allergic rhinitis due to animal (cat) (dog) hair and dander: Secondary | ICD-10-CM | POA: Diagnosis not present

## 2020-02-14 DIAGNOSIS — J3089 Other allergic rhinitis: Secondary | ICD-10-CM | POA: Diagnosis not present

## 2020-02-18 ENCOUNTER — Ambulatory Visit: Payer: Medicare Other | Attending: Internal Medicine

## 2020-02-18 DIAGNOSIS — Z23 Encounter for immunization: Secondary | ICD-10-CM

## 2020-02-18 NOTE — Progress Notes (Signed)
   Covid-19 Vaccination Clinic  Name:  Kristine Francis    MRN: [id number] DOB: October 11, 1941  02/18/2020  Kristine Francis was observed post Covid-19 immunization for 15 minutes without incident. She was provided with Vaccine Information Sheet and instruction to access the V-Safe system.   Kristine Francis was instructed to call 911 with any severe reactions post vaccine: Marland Kitchen Difficulty breathing  . Swelling of face and throat  . A fast heartbeat  . A bad rash all over body  . Dizziness and weakness   Immunizations Administered    Name Date Dose VIS Date Route   Pfizer COVID-19 Vaccine 02/18/2020  9:10 AM 0.3 mL 11/01/2019 Intramuscular   Manufacturer: [employer]   Lot: [id number]   [place]: [id number]

## 2020-03-02 DIAGNOSIS — J3089 Other allergic rhinitis: Secondary | ICD-10-CM | POA: Diagnosis not present

## 2020-03-02 DIAGNOSIS — J301 Allergic rhinitis due to pollen: Secondary | ICD-10-CM | POA: Diagnosis not present

## 2020-03-02 DIAGNOSIS — J3081 Allergic rhinitis due to animal (cat) (dog) hair and dander: Secondary | ICD-10-CM | POA: Diagnosis not present

## 2020-03-24 DIAGNOSIS — J301 Allergic rhinitis due to pollen: Secondary | ICD-10-CM | POA: Diagnosis not present

## 2020-03-24 DIAGNOSIS — J3081 Allergic rhinitis due to animal (cat) (dog) hair and dander: Secondary | ICD-10-CM | POA: Diagnosis not present

## 2020-03-24 DIAGNOSIS — J3089 Other allergic rhinitis: Secondary | ICD-10-CM | POA: Diagnosis not present

## 2020-04-06 DIAGNOSIS — J3089 Other allergic rhinitis: Secondary | ICD-10-CM | POA: Diagnosis not present

## 2020-04-06 DIAGNOSIS — J301 Allergic rhinitis due to pollen: Secondary | ICD-10-CM | POA: Diagnosis not present

## 2020-04-06 DIAGNOSIS — J3081 Allergic rhinitis due to animal (cat) (dog) hair and dander: Secondary | ICD-10-CM | POA: Diagnosis not present

## 2020-04-24 DIAGNOSIS — J301 Allergic rhinitis due to pollen: Secondary | ICD-10-CM | POA: Diagnosis not present

## 2020-04-24 DIAGNOSIS — J3081 Allergic rhinitis due to animal (cat) (dog) hair and dander: Secondary | ICD-10-CM | POA: Diagnosis not present

## 2020-04-24 DIAGNOSIS — J3089 Other allergic rhinitis: Secondary | ICD-10-CM | POA: Diagnosis not present

## 2020-06-02 DIAGNOSIS — J301 Allergic rhinitis due to pollen: Secondary | ICD-10-CM | POA: Diagnosis not present

## 2020-06-02 DIAGNOSIS — J3089 Other allergic rhinitis: Secondary | ICD-10-CM | POA: Diagnosis not present

## 2020-06-02 DIAGNOSIS — J3081 Allergic rhinitis due to animal (cat) (dog) hair and dander: Secondary | ICD-10-CM | POA: Diagnosis not present

## 2020-06-04 DIAGNOSIS — J3081 Allergic rhinitis due to animal (cat) (dog) hair and dander: Secondary | ICD-10-CM | POA: Diagnosis not present

## 2020-06-04 DIAGNOSIS — J301 Allergic rhinitis due to pollen: Secondary | ICD-10-CM | POA: Diagnosis not present

## 2020-06-04 DIAGNOSIS — J3089 Other allergic rhinitis: Secondary | ICD-10-CM | POA: Diagnosis not present

## 2020-06-18 DIAGNOSIS — J3081 Allergic rhinitis due to animal (cat) (dog) hair and dander: Secondary | ICD-10-CM | POA: Diagnosis not present

## 2020-06-18 DIAGNOSIS — J3089 Other allergic rhinitis: Secondary | ICD-10-CM | POA: Diagnosis not present

## 2020-06-18 DIAGNOSIS — J301 Allergic rhinitis due to pollen: Secondary | ICD-10-CM | POA: Diagnosis not present

## 2020-07-06 DIAGNOSIS — J3089 Other allergic rhinitis: Secondary | ICD-10-CM | POA: Diagnosis not present

## 2020-07-06 DIAGNOSIS — J301 Allergic rhinitis due to pollen: Secondary | ICD-10-CM | POA: Diagnosis not present

## 2020-07-06 DIAGNOSIS — J3081 Allergic rhinitis due to animal (cat) (dog) hair and dander: Secondary | ICD-10-CM | POA: Diagnosis not present

## 2020-07-10 DIAGNOSIS — J301 Allergic rhinitis due to pollen: Secondary | ICD-10-CM | POA: Diagnosis not present

## 2020-07-10 DIAGNOSIS — J3089 Other allergic rhinitis: Secondary | ICD-10-CM | POA: Diagnosis not present

## 2020-07-10 DIAGNOSIS — J3081 Allergic rhinitis due to animal (cat) (dog) hair and dander: Secondary | ICD-10-CM | POA: Diagnosis not present

## 2020-07-14 ENCOUNTER — Ambulatory Visit (INDEPENDENT_AMBULATORY_CARE_PROVIDER_SITE_OTHER): Payer: Medicare Other | Admitting: Family Medicine

## 2020-07-14 ENCOUNTER — Other Ambulatory Visit: Payer: Self-pay

## 2020-07-14 ENCOUNTER — Encounter: Payer: Self-pay | Admitting: Family Medicine

## 2020-07-14 VITALS — BP 130/70 | HR 90 | Temp 97.7°F | Wt 87.8 lb

## 2020-07-14 DIAGNOSIS — J301 Allergic rhinitis due to pollen: Secondary | ICD-10-CM | POA: Diagnosis not present

## 2020-07-14 DIAGNOSIS — J3089 Other allergic rhinitis: Secondary | ICD-10-CM | POA: Diagnosis not present

## 2020-07-14 DIAGNOSIS — J3081 Allergic rhinitis due to animal (cat) (dog) hair and dander: Secondary | ICD-10-CM | POA: Diagnosis not present

## 2020-07-14 DIAGNOSIS — L84 Corns and callosities: Secondary | ICD-10-CM | POA: Diagnosis not present

## 2020-07-14 DIAGNOSIS — B354 Tinea corporis: Secondary | ICD-10-CM

## 2020-07-14 MED ORDER — KETOCONAZOLE 2 % EX CREA: 1.0000 "application " | TOPICAL_CREAM | Freq: Two times a day (BID) | CUTANEOUS | 2 refills | Status: DC | PRN

## 2020-07-14 NOTE — Progress Notes (Signed)
   Subjective:    Patient ID: Kristine Francis, female    DOB: 05/27/1941, 79 y.o.   MRN: [id number]  HPI Here for 2 issues. First several weeks ago she noticed a spot on the bottom of her right foot that gets sore when she walks on it. Also a week ago they noticed a red spot on her right flank. This does not bother her at all.    Review of Systems  Constitutional: Negative.   Respiratory: Negative.   Cardiovascular: Negative.   Skin: Positive for rash.       Objective:   Physical Exam Constitutional:      Comments: Walks with a walker   Cardiovascular:     Rate and Rhythm: Normal rate and regular rhythm.     Pulses: Normal pulses.     Heart sounds: Normal heart sounds.  Pulmonary:     Effort: Pulmonary effort is normal.     Breath sounds: Normal breath sounds.  Skin:    Comments: There is a large callus on the bottom of the right foot over the first metatarsal head. There is also a 2 cm annular lesion on the right flank with a red margin and clear center   Neurological:     Mental Status: She is alert.           Assessment & Plan:  Tinea corporis, treat with Ketoconazole cream. For the callus, refer to Podiatry. [name], MD

## 2020-08-13 ENCOUNTER — Ambulatory Visit: Payer: Medicare Other | Admitting: Podiatry

## 2020-08-14 DIAGNOSIS — J301 Allergic rhinitis due to pollen: Secondary | ICD-10-CM | POA: Diagnosis not present

## 2020-08-14 DIAGNOSIS — J3081 Allergic rhinitis due to animal (cat) (dog) hair and dander: Secondary | ICD-10-CM | POA: Diagnosis not present

## 2020-08-14 DIAGNOSIS — J3089 Other allergic rhinitis: Secondary | ICD-10-CM | POA: Diagnosis not present

## 2020-08-18 DIAGNOSIS — J3081 Allergic rhinitis due to animal (cat) (dog) hair and dander: Secondary | ICD-10-CM | POA: Diagnosis not present

## 2020-08-18 DIAGNOSIS — J301 Allergic rhinitis due to pollen: Secondary | ICD-10-CM | POA: Diagnosis not present

## 2020-08-18 DIAGNOSIS — J3089 Other allergic rhinitis: Secondary | ICD-10-CM | POA: Diagnosis not present

## 2020-08-20 ENCOUNTER — Ambulatory Visit (INDEPENDENT_AMBULATORY_CARE_PROVIDER_SITE_OTHER): Payer: Medicare Other | Admitting: Podiatry

## 2020-08-20 ENCOUNTER — Other Ambulatory Visit: Payer: Self-pay

## 2020-08-20 ENCOUNTER — Telehealth: Payer: Self-pay

## 2020-08-20 DIAGNOSIS — M2041 Other hammer toe(s) (acquired), right foot: Secondary | ICD-10-CM

## 2020-08-20 DIAGNOSIS — L84 Corns and callosities: Secondary | ICD-10-CM

## 2020-08-20 DIAGNOSIS — M2031 Hallux varus (acquired), right foot: Secondary | ICD-10-CM | POA: Diagnosis not present

## 2020-08-20 DIAGNOSIS — R52 Pain, unspecified: Secondary | ICD-10-CM

## 2020-08-20 DIAGNOSIS — R1909 Other intra-abdominal and pelvic swelling, mass and lump: Secondary | ICD-10-CM | POA: Insufficient documentation

## 2020-08-20 NOTE — Telephone Encounter (Signed)
It was Urea cream. Thanks!

## 2020-08-20 NOTE — Progress Notes (Signed)
  Subjective:  Patient ID: Kristine Francis, female    DOB: 09-07-1941,  MRN: [id number]  Chief Complaint  Patient presents with  . Callouses    painful callus lesion    79 y.o. female presents with the above complaint. History confirmed with patient.  She is painful spot under the big toe joint on the right foot.  She had she is getting hammertoes as well.  She has a history of neuropathy with no known cause.  She is here today with her husband.  Objective:  Physical Exam: warm, good capillary refill, no trophic changes or ulcerative lesions, normal DP and PT pulses and reduced incision light touch bilateral feet.   Right Foot:  Large callus submet one that is very painful, she has digital contractures and hallux malleus   Assessment:   1. Pain   2. Callus of foot   3. Hammertoe of right foot   4. Hallux malleus of right foot      Plan:  Patient was evaluated and treated and all questions answered.  All symptomatic hyperkeratoses were safely debrided with a sterile #15 blade to patient's level of comfort without incident. We discussed preventative and palliative care of these lesions including supportive and accommodative shoegear, padding, prefabricated and custom molded accommodative orthoses, use of a pumice stone and lotions/creams daily.  Recommended urea cream and pumice stone daily  Dancers pad dispensed and she should wear this when walking.  Advised to wear supportive sneakers and that her current sandals may not help as much    Return in about 2 months (around 10/20/2020).

## 2020-08-20 NOTE — Telephone Encounter (Signed)
Pt needs to know the name of the cream to get for her callous.

## 2020-08-20 NOTE — Telephone Encounter (Signed)
Pt was told that she need a prescription for this urea cream. Please advise.

## 2020-08-21 DIAGNOSIS — J3089 Other allergic rhinitis: Secondary | ICD-10-CM | POA: Diagnosis not present

## 2020-08-21 DIAGNOSIS — J3081 Allergic rhinitis due to animal (cat) (dog) hair and dander: Secondary | ICD-10-CM | POA: Diagnosis not present

## 2020-08-21 DIAGNOSIS — J301 Allergic rhinitis due to pollen: Secondary | ICD-10-CM | POA: Diagnosis not present

## 2020-08-21 MED ORDER — UREA 40 % EX CREA: TOPICAL_CREAM | CUTANEOUS | 2 refills | Status: DC

## 2020-08-21 NOTE — Telephone Encounter (Signed)
Rx for urea 40% cream sent to pharmacy

## 2020-08-24 DIAGNOSIS — J301 Allergic rhinitis due to pollen: Secondary | ICD-10-CM | POA: Diagnosis not present

## 2020-08-24 DIAGNOSIS — J3089 Other allergic rhinitis: Secondary | ICD-10-CM | POA: Diagnosis not present

## 2020-08-24 DIAGNOSIS — J3081 Allergic rhinitis due to animal (cat) (dog) hair and dander: Secondary | ICD-10-CM | POA: Diagnosis not present

## 2020-09-07 DIAGNOSIS — J3089 Other allergic rhinitis: Secondary | ICD-10-CM | POA: Diagnosis not present

## 2020-09-07 DIAGNOSIS — J3081 Allergic rhinitis due to animal (cat) (dog) hair and dander: Secondary | ICD-10-CM | POA: Diagnosis not present

## 2020-09-07 DIAGNOSIS — J301 Allergic rhinitis due to pollen: Secondary | ICD-10-CM | POA: Diagnosis not present

## 2020-09-29 ENCOUNTER — Ambulatory Visit: Payer: Medicare Other | Admitting: Podiatry

## 2020-09-29 DIAGNOSIS — J3081 Allergic rhinitis due to animal (cat) (dog) hair and dander: Secondary | ICD-10-CM | POA: Diagnosis not present

## 2020-09-29 DIAGNOSIS — J3089 Other allergic rhinitis: Secondary | ICD-10-CM | POA: Diagnosis not present

## 2020-09-29 DIAGNOSIS — J301 Allergic rhinitis due to pollen: Secondary | ICD-10-CM | POA: Diagnosis not present

## 2020-10-12 ENCOUNTER — Other Ambulatory Visit: Payer: Self-pay

## 2020-10-12 ENCOUNTER — Encounter: Payer: Self-pay | Admitting: Podiatry

## 2020-10-12 ENCOUNTER — Ambulatory Visit (INDEPENDENT_AMBULATORY_CARE_PROVIDER_SITE_OTHER): Payer: Medicare Other | Admitting: Podiatry

## 2020-10-12 DIAGNOSIS — M2031 Hallux varus (acquired), right foot: Secondary | ICD-10-CM

## 2020-10-12 DIAGNOSIS — R52 Pain, unspecified: Secondary | ICD-10-CM

## 2020-10-12 DIAGNOSIS — L84 Corns and callosities: Secondary | ICD-10-CM | POA: Diagnosis not present

## 2020-10-12 NOTE — Progress Notes (Signed)
  Subjective:  Patient ID: Kristine Francis, female    DOB: [birth year],  MRN: [id number]  Chief Complaint  Patient presents with  . Callouses    Right foot     79 y.o. female returns with the above complaint. History confirmed with patient.  Here with her husband.  She has been using the urea cream and pumice stone that he helps her with that has been helpful.  Objective:  Physical Exam: warm, good capillary refill, no trophic changes or ulcerative lesions, normal DP and PT pulses and reduced incision light touch bilateral feet.   Right Foot: Preulcerative callus submet 1, improved since last visit in character and size   Assessment:   1. Hallux malleus of right foot   2. Callus of foot   3. Pain      Plan:  Patient was evaluated and treated and all questions answered.  All symptomatic hyperkeratoses were safely debrided with a sterile #15 blade to patient's level of comfort without incident. We discussed preventative and palliative care of these lesions including supportive and accommodative shoegear, padding, prefabricated and custom molded accommodative orthoses, use of a pumice stone and lotions/creams daily.  Recommended they continue urea cream and pumice stone daily  I applied a dancers pad to the insole of her shoe today and think this will alleviate pressure   Return in about 2 months (around 12/12/2020), or if symptoms worsen or fail to improve.

## 2020-10-20 DIAGNOSIS — J3081 Allergic rhinitis due to animal (cat) (dog) hair and dander: Secondary | ICD-10-CM | POA: Diagnosis not present

## 2020-10-20 DIAGNOSIS — J3089 Other allergic rhinitis: Secondary | ICD-10-CM | POA: Diagnosis not present

## 2020-10-20 DIAGNOSIS — J301 Allergic rhinitis due to pollen: Secondary | ICD-10-CM | POA: Diagnosis not present

## 2020-10-29 DIAGNOSIS — Z23 Encounter for immunization: Secondary | ICD-10-CM | POA: Diagnosis not present

## 2020-11-16 DIAGNOSIS — J301 Allergic rhinitis due to pollen: Secondary | ICD-10-CM | POA: Diagnosis not present

## 2020-11-16 DIAGNOSIS — J3081 Allergic rhinitis due to animal (cat) (dog) hair and dander: Secondary | ICD-10-CM | POA: Diagnosis not present

## 2020-11-16 DIAGNOSIS — J3089 Other allergic rhinitis: Secondary | ICD-10-CM | POA: Diagnosis not present

## 2020-11-25 ENCOUNTER — Emergency Department (HOSPITAL_COMMUNITY): Payer: Medicare Other

## 2020-11-25 ENCOUNTER — Inpatient Hospital Stay (HOSPITAL_COMMUNITY)
Admission: EM | Admit: 2020-11-25 | Discharge: 2020-12-10 | DRG: 100 | Disposition: A | Discharge status: Rehabilitation Facility | Payer: Medicare Other | Attending: Internal Medicine | Admitting: Internal Medicine

## 2020-11-25 ENCOUNTER — Encounter (HOSPITAL_COMMUNITY): Payer: Self-pay | Admitting: Emergency Medicine

## 2020-11-25 DIAGNOSIS — Z808 Family history of malignant neoplasm of other organs or systems: Secondary | ICD-10-CM

## 2020-11-25 DIAGNOSIS — E876 Hypokalemia: Secondary | ICD-10-CM | POA: Diagnosis not present

## 2020-11-25 DIAGNOSIS — E440 Moderate protein-calorie malnutrition: Secondary | ICD-10-CM | POA: Diagnosis not present

## 2020-11-25 DIAGNOSIS — R4182 Altered mental status, unspecified: Secondary | ICD-10-CM | POA: Diagnosis not present

## 2020-11-25 DIAGNOSIS — K760 Fatty (change of) liver, not elsewhere classified: Secondary | ICD-10-CM | POA: Diagnosis not present

## 2020-11-25 DIAGNOSIS — I480 Paroxysmal atrial fibrillation: Secondary | ICD-10-CM | POA: Diagnosis present

## 2020-11-25 DIAGNOSIS — G931 Anoxic brain damage, not elsewhere classified: Secondary | ICD-10-CM | POA: Diagnosis not present

## 2020-11-25 DIAGNOSIS — J9601 Acute respiratory failure with hypoxia: Secondary | ICD-10-CM | POA: Insufficient documentation

## 2020-11-25 DIAGNOSIS — J690 Pneumonitis due to inhalation of food and vomit: Secondary | ICD-10-CM | POA: Diagnosis present

## 2020-11-25 DIAGNOSIS — Z91041 Radiographic dye allergy status: Secondary | ICD-10-CM

## 2020-11-25 DIAGNOSIS — R1313 Dysphagia, pharyngeal phase: Secondary | ICD-10-CM | POA: Diagnosis present

## 2020-11-25 DIAGNOSIS — Z8041 Family history of malignant neoplasm of ovary: Secondary | ICD-10-CM

## 2020-11-25 DIAGNOSIS — J439 Emphysema, unspecified: Secondary | ICD-10-CM | POA: Diagnosis present

## 2020-11-25 DIAGNOSIS — E43 Unspecified severe protein-calorie malnutrition: Secondary | ICD-10-CM | POA: Diagnosis not present

## 2020-11-25 DIAGNOSIS — I69398 Other sequelae of cerebral infarction: Secondary | ICD-10-CM

## 2020-11-25 DIAGNOSIS — R2689 Other abnormalities of gait and mobility: Secondary | ICD-10-CM | POA: Diagnosis present

## 2020-11-25 DIAGNOSIS — R569 Unspecified convulsions: Secondary | ICD-10-CM

## 2020-11-25 DIAGNOSIS — G608 Other hereditary and idiopathic neuropathies: Secondary | ICD-10-CM | POA: Diagnosis present

## 2020-11-25 DIAGNOSIS — Z9841 Cataract extraction status, right eye: Secondary | ICD-10-CM

## 2020-11-25 DIAGNOSIS — R404 Transient alteration of awareness: Secondary | ICD-10-CM | POA: Diagnosis not present

## 2020-11-25 DIAGNOSIS — R54 Age-related physical debility: Secondary | ICD-10-CM | POA: Diagnosis present

## 2020-11-25 DIAGNOSIS — Z681 Body mass index (BMI) 19 or less, adult: Secondary | ICD-10-CM | POA: Diagnosis not present

## 2020-11-25 DIAGNOSIS — G119 Hereditary ataxia, unspecified: Secondary | ICD-10-CM | POA: Diagnosis not present

## 2020-11-25 DIAGNOSIS — I69391 Dysphagia following cerebral infarction: Secondary | ICD-10-CM

## 2020-11-25 DIAGNOSIS — E854 Organ-limited amyloidosis: Secondary | ICD-10-CM | POA: Diagnosis present

## 2020-11-25 DIAGNOSIS — R0902 Hypoxemia: Secondary | ICD-10-CM | POA: Diagnosis not present

## 2020-11-25 DIAGNOSIS — I10 Essential (primary) hypertension: Secondary | ICD-10-CM | POA: Diagnosis present

## 2020-11-25 DIAGNOSIS — R279 Unspecified lack of coordination: Secondary | ICD-10-CM

## 2020-11-25 DIAGNOSIS — E871 Hypo-osmolality and hyponatremia: Secondary | ICD-10-CM | POA: Diagnosis not present

## 2020-11-25 DIAGNOSIS — Z9911 Dependence on respirator [ventilator] status: Secondary | ICD-10-CM | POA: Diagnosis not present

## 2020-11-25 DIAGNOSIS — I469 Cardiac arrest, cause unspecified: Secondary | ICD-10-CM | POA: Diagnosis not present

## 2020-11-25 DIAGNOSIS — F05 Delirium due to known physiological condition: Secondary | ICD-10-CM | POA: Diagnosis not present

## 2020-11-25 DIAGNOSIS — G9340 Encephalopathy, unspecified: Secondary | ICD-10-CM | POA: Diagnosis present

## 2020-11-25 DIAGNOSIS — R64 Cachexia: Secondary | ICD-10-CM | POA: Diagnosis not present

## 2020-11-25 DIAGNOSIS — R531 Weakness: Secondary | ICD-10-CM | POA: Diagnosis not present

## 2020-11-25 DIAGNOSIS — Z8249 Family history of ischemic heart disease and other diseases of the circulatory system: Secondary | ICD-10-CM

## 2020-11-25 DIAGNOSIS — J449 Chronic obstructive pulmonary disease, unspecified: Secondary | ICD-10-CM

## 2020-11-25 DIAGNOSIS — Z803 Family history of malignant neoplasm of breast: Secondary | ICD-10-CM

## 2020-11-25 DIAGNOSIS — E785 Hyperlipidemia, unspecified: Secondary | ICD-10-CM | POA: Diagnosis present

## 2020-11-25 DIAGNOSIS — G40409 Other generalized epilepsy and epileptic syndromes, not intractable, without status epilepticus: Secondary | ICD-10-CM | POA: Diagnosis not present

## 2020-11-25 DIAGNOSIS — J9690 Respiratory failure, unspecified, unspecified whether with hypoxia or hypercapnia: Secondary | ICD-10-CM | POA: Diagnosis not present

## 2020-11-25 DIAGNOSIS — Z8673 Personal history of transient ischemic attack (TIA), and cerebral infarction without residual deficits: Secondary | ICD-10-CM

## 2020-11-25 DIAGNOSIS — Z800 Family history of malignant neoplasm of digestive organs: Secondary | ICD-10-CM

## 2020-11-25 DIAGNOSIS — I639 Cerebral infarction, unspecified: Secondary | ICD-10-CM

## 2020-11-25 DIAGNOSIS — Z7982 Long term (current) use of aspirin: Secondary | ICD-10-CM

## 2020-11-25 DIAGNOSIS — G9341 Metabolic encephalopathy: Secondary | ICD-10-CM | POA: Diagnosis not present

## 2020-11-25 DIAGNOSIS — K219 Gastro-esophageal reflux disease without esophagitis: Secondary | ICD-10-CM | POA: Diagnosis present

## 2020-11-25 DIAGNOSIS — Z20822 Contact with and (suspected) exposure to covid-19: Secondary | ICD-10-CM | POA: Diagnosis present

## 2020-11-25 DIAGNOSIS — E041 Nontoxic single thyroid nodule: Secondary | ICD-10-CM | POA: Diagnosis not present

## 2020-11-25 DIAGNOSIS — R739 Hyperglycemia, unspecified: Secondary | ICD-10-CM | POA: Diagnosis present

## 2020-11-25 DIAGNOSIS — E875 Hyperkalemia: Secondary | ICD-10-CM | POA: Diagnosis not present

## 2020-11-25 DIAGNOSIS — Z23 Encounter for immunization: Secondary | ICD-10-CM | POA: Diagnosis present

## 2020-11-25 DIAGNOSIS — I468 Cardiac arrest due to other underlying condition: Secondary | ICD-10-CM | POA: Diagnosis present

## 2020-11-25 DIAGNOSIS — I69320 Aphasia following cerebral infarction: Secondary | ICD-10-CM

## 2020-11-25 DIAGNOSIS — D509 Iron deficiency anemia, unspecified: Secondary | ICD-10-CM | POA: Diagnosis present

## 2020-11-25 DIAGNOSIS — Z792 Long term (current) use of antibiotics: Secondary | ICD-10-CM

## 2020-11-25 DIAGNOSIS — I6381 Other cerebral infarction due to occlusion or stenosis of small artery: Secondary | ICD-10-CM | POA: Diagnosis not present

## 2020-11-25 DIAGNOSIS — R29818 Other symptoms and signs involving the nervous system: Secondary | ICD-10-CM | POA: Diagnosis not present

## 2020-11-25 DIAGNOSIS — G459 Transient cerebral ischemic attack, unspecified: Secondary | ICD-10-CM | POA: Diagnosis not present

## 2020-11-25 DIAGNOSIS — Z85828 Personal history of other malignant neoplasm of skin: Secondary | ICD-10-CM

## 2020-11-25 DIAGNOSIS — Z888 Allergy status to other drugs, medicaments and biological substances status: Secondary | ICD-10-CM

## 2020-11-25 DIAGNOSIS — Z4682 Encounter for fitting and adjustment of non-vascular catheter: Secondary | ICD-10-CM | POA: Diagnosis not present

## 2020-11-25 DIAGNOSIS — Z9842 Cataract extraction status, left eye: Secondary | ICD-10-CM

## 2020-11-25 DIAGNOSIS — J479 Bronchiectasis, uncomplicated: Secondary | ICD-10-CM | POA: Diagnosis not present

## 2020-11-25 DIAGNOSIS — Z79899 Other long term (current) drug therapy: Secondary | ICD-10-CM

## 2020-11-25 DIAGNOSIS — T17908A Unspecified foreign body in respiratory tract, part unspecified causing other injury, initial encounter: Secondary | ICD-10-CM | POA: Insufficient documentation

## 2020-11-25 DIAGNOSIS — Z7952 Long term (current) use of systemic steroids: Secondary | ICD-10-CM

## 2020-11-25 DIAGNOSIS — E860 Dehydration: Secondary | ICD-10-CM | POA: Diagnosis present

## 2020-11-25 DIAGNOSIS — R5381 Other malaise: Secondary | ICD-10-CM | POA: Diagnosis not present

## 2020-11-25 DIAGNOSIS — H5502 Latent nystagmus: Secondary | ICD-10-CM | POA: Diagnosis present

## 2020-11-25 DIAGNOSIS — I680 Cerebral amyloid angiopathy: Secondary | ICD-10-CM | POA: Diagnosis present

## 2020-11-25 DIAGNOSIS — J90 Pleural effusion, not elsewhere classified: Secondary | ICD-10-CM | POA: Diagnosis not present

## 2020-11-25 DIAGNOSIS — Z978 Presence of other specified devices: Secondary | ICD-10-CM

## 2020-11-25 LAB — I-STAT CHEM 8, ED
BUN: 18 mg/dL (ref 8–23)
Calcium, Ion: 1.13 mmol/L — ABNORMAL LOW (ref 1.15–1.40)
Chloride: 99 mmol/L (ref 98–111)
Creatinine, Ser: 0.7 mg/dL (ref 0.44–1.00)
Glucose, Bld: 170 mg/dL — ABNORMAL HIGH (ref 70–99)
HCT: 42 % (ref 36.0–46.0)
Hemoglobin: 14.3 g/dL (ref 12.0–15.0)
Potassium: 3.5 mmol/L (ref 3.5–5.1)
Sodium: 138 mmol/L (ref 135–145)
TCO2: 26 mmol/L (ref 22–32)

## 2020-11-25 LAB — COMPREHENSIVE METABOLIC PANEL WITH GFR
ALT: 15 U/L (ref 0–44)
AST: 22 U/L (ref 15–41)
Albumin: 3.8 g/dL (ref 3.5–5.0)
Alkaline Phosphatase: 63 U/L (ref 38–126)
Anion gap: 13 (ref 5–15)
BUN: 16 mg/dL (ref 8–23)
CO2: 24 mmol/L (ref 22–32)
Calcium: 9.5 mg/dL (ref 8.9–10.3)
Chloride: 100 mmol/L (ref 98–111)
Creatinine, Ser: 0.89 mg/dL (ref 0.44–1.00)
GFR, Estimated: >60 mL/min
Glucose, Bld: 172 mg/dL — ABNORMAL HIGH (ref 70–99)
Potassium: 3.6 mmol/L (ref 3.5–5.1)
Sodium: 137 mmol/L (ref 135–145)
Total Bilirubin: 1.8 mg/dL — ABNORMAL HIGH (ref 0.3–1.2)
Total Protein: 6.8 g/dL (ref 6.5–8.1)

## 2020-11-25 LAB — BLOOD GAS, ARTERIAL
Acid-Base Excess: 0.6 mmol/L (ref 0.0–2.0)
Bicarbonate: 25.5 mmol/L (ref 20.0–28.0)
FIO2: 100
O2 Saturation: 98.7 %
Patient temperature: 36.1
pCO2 arterial: 45.1 mmHg (ref 32.0–48.0)
pH, Arterial: 7.366 (ref 7.350–7.450)
pO2, Arterial: 141 mmHg — ABNORMAL HIGH (ref 83.0–108.0)

## 2020-11-25 LAB — CBG MONITORING, ED
Glucose-Capillary: 157 mg/dL — ABNORMAL HIGH (ref 70–99)
Glucose-Capillary: 206 mg/dL — ABNORMAL HIGH (ref 70–99)
Glucose-Capillary: 151 mg/dL — ABNORMAL HIGH (ref 70–99)

## 2020-11-25 LAB — HEMOGLOBIN A1C
Hgb A1c MFr Bld: 5.5 % (ref 4.8–5.6)
Mean Plasma Glucose: 111.15 mg/dL

## 2020-11-25 LAB — DIFFERENTIAL
Abs Immature Granulocytes: 0.03 10*3/uL (ref 0.00–0.07)
Basophils Absolute: 0.1 10*3/uL (ref 0.0–0.1)
Basophils Relative: 1 %
Eosinophils Absolute: 0.1 10*3/uL (ref 0.0–0.5)
Eosinophils Relative: 1 %
Immature Granulocytes: 0 %
Lymphocytes Relative: 21 %
Lymphs Abs: 1.8 10*3/uL (ref 0.7–4.0)
Monocytes Absolute: 0.8 10*3/uL (ref 0.1–1.0)
Monocytes Relative: 9 %
Neutro Abs: 6.1 10*3/uL (ref 1.7–7.7)
Neutrophils Relative %: 68 %

## 2020-11-25 LAB — URINALYSIS, ROUTINE W REFLEX MICROSCOPIC
Bilirubin Urine: NEGATIVE
Glucose, UA: 50 mg/dL — AB
Hgb urine dipstick: NEGATIVE
Ketones, ur: NEGATIVE mg/dL
Leukocytes,Ua: NEGATIVE
Nitrite: NEGATIVE
Protein, ur: NEGATIVE mg/dL
Specific Gravity, Urine: 1.034 — ABNORMAL HIGH (ref 1.005–1.030)
pH: 7 (ref 5.0–8.0)

## 2020-11-25 LAB — CBC
HCT: 42.3 % (ref 36.0–46.0)
Hemoglobin: 13.9 g/dL (ref 12.0–15.0)
MCH: 30.3 pg (ref 26.0–34.0)
MCHC: 32.9 g/dL (ref 30.0–36.0)
MCV: 92.2 fL (ref 80.0–100.0)
Platelets: 277 10*3/uL (ref 150–400)
RBC: 4.59 MIL/uL (ref 3.87–5.11)
RDW: 13.1 % (ref 11.5–15.5)
WBC: 8.8 10*3/uL (ref 4.0–10.5)
nRBC: 0 % (ref 0.0–0.2)

## 2020-11-25 LAB — PROTIME-INR
INR: 1 (ref 0.8–1.2)
Prothrombin Time: 13.2 seconds (ref 11.4–15.2)

## 2020-11-25 LAB — RESP PANEL BY RT-PCR (RSV, FLU A&B, COVID)  RVPGX2
Influenza A by PCR: NEGATIVE
Influenza B by PCR: NEGATIVE
Resp Syncytial Virus by PCR: NEGATIVE
SARS Coronavirus 2 by RT PCR: NEGATIVE

## 2020-11-25 LAB — APTT: aPTT: 29 seconds (ref 24–36)

## 2020-11-25 LAB — TRIGLYCERIDES: Triglycerides: 41 mg/dL (ref <150)

## 2020-11-25 LAB — TROPONIN I (HIGH SENSITIVITY): Troponin I (High Sensitivity): 22 ng/L — ABNORMAL HIGH (ref <18)

## 2020-11-25 MED ORDER — HEPARIN SODIUM (PORCINE) 5000 UNIT/ML IJ SOLN
5000.0000 [IU] | Freq: Three times a day (TID) | INTRAMUSCULAR | Status: DC
  Administered 2020-11-25 – 2020-11-30 (×14): 5000 [IU] via SUBCUTANEOUS
  Filled 2020-11-25 (×14): qty 1

## 2020-11-25 MED ORDER — FENTANYL CITRATE (PF) 100 MCG/2ML IJ SOLN
50.0000 ug | Freq: Once | INTRAMUSCULAR | Status: AC
  Administered 2020-11-25: 50 ug via INTRAVENOUS
  Filled 2020-11-25: qty 2

## 2020-11-25 MED ORDER — LACTATED RINGERS IV SOLN: INTRAVENOUS | Status: DC

## 2020-11-25 MED ORDER — DIPHENHYDRAMINE HCL 25 MG PO CAPS
50.0000 mg | ORAL_CAPSULE | Freq: Once | ORAL | Status: AC
  Administered 2020-11-25: 50 mg via ORAL

## 2020-11-25 MED ORDER — DOCUSATE SODIUM 100 MG PO CAPS: 100.0000 mg | ORAL_CAPSULE | Freq: Two times a day (BID) | ORAL | Status: DC | PRN

## 2020-11-25 MED ORDER — SODIUM CHLORIDE 0.9 % IV SOLN: 1.0000 g | Freq: Four times a day (QID) | INTRAVENOUS | Status: DC

## 2020-11-25 MED ORDER — SODIUM CHLORIDE 0.9 % IV SOLN: 3.0000 g | Freq: Three times a day (TID) | INTRAVENOUS | Status: DC

## 2020-11-25 MED ORDER — SODIUM CHLORIDE 0.9 % IV SOLN
2.0000 g | Freq: Three times a day (TID) | INTRAVENOUS | Status: DC
  Administered 2020-11-25 – 2020-11-29 (×11): 2 g via INTRAVENOUS
  Filled 2020-11-25 (×4): qty 2000
  Filled 2020-11-25: qty 2
  Filled 2020-11-25: qty 2000
  Filled 2020-11-25 (×3): qty 2
  Filled 2020-11-25 (×5): qty 2000

## 2020-11-25 MED ORDER — FENTANYL 2500MCG IN NS 250ML (10MCG/ML) PREMIX INFUSION
25.0000 ug/h | INTRAVENOUS | Status: DC
  Administered 2020-11-25: 50 ug/h via INTRAVENOUS
  Administered 2020-11-26: 100 ug/h via INTRAVENOUS
  Filled 2020-11-25 (×2): qty 250

## 2020-11-25 MED ORDER — IOHEXOL 350 MG/ML SOLN
75.0000 mL | Freq: Once | INTRAVENOUS | Status: AC | PRN
  Administered 2020-11-25: 75 mL via INTRAVENOUS

## 2020-11-25 MED ORDER — PROPOFOL 1000 MG/100ML IV EMUL
5.0000 ug/kg/min | INTRAVENOUS | Status: DC
  Administered 2020-11-25: 20 ug/kg/min via INTRAVENOUS
  Filled 2020-11-25: qty 100

## 2020-11-25 MED ORDER — INSULIN ASPART 100 UNIT/ML ~~LOC~~ SOLN
0.0000 [IU] | SUBCUTANEOUS | Status: DC
  Administered 2020-11-26: 2 [IU] via SUBCUTANEOUS
  Administered 2020-11-26: 1 [IU] via SUBCUTANEOUS
  Administered 2020-11-27: 2 [IU] via SUBCUTANEOUS
  Administered 2020-11-27 – 2020-11-28 (×3): 1 [IU] via SUBCUTANEOUS
  Administered 2020-11-28: 2 [IU] via SUBCUTANEOUS
  Administered 2020-11-28 – 2020-11-30 (×4): 1 [IU] via SUBCUTANEOUS
  Administered 2020-11-30: 3 [IU] via SUBCUTANEOUS
  Administered 2020-11-30 – 2020-12-01 (×3): 2 [IU] via SUBCUTANEOUS
  Administered 2020-12-01: 1 [IU] via SUBCUTANEOUS
  Administered 2020-12-01 (×2): 3 [IU] via SUBCUTANEOUS
  Administered 2020-12-01 – 2020-12-02 (×2): 2 [IU] via SUBCUTANEOUS
  Administered 2020-12-02 – 2020-12-03 (×4): 1 [IU] via SUBCUTANEOUS
  Administered 2020-12-03 (×2): 2 [IU] via SUBCUTANEOUS
  Administered 2020-12-03: 1 [IU] via SUBCUTANEOUS
  Administered 2020-12-04: 2 [IU] via SUBCUTANEOUS
  Administered 2020-12-04 (×2): 1 [IU] via SUBCUTANEOUS
  Administered 2020-12-05: 2 [IU] via SUBCUTANEOUS
  Administered 2020-12-05 (×4): 1 [IU] via SUBCUTANEOUS

## 2020-11-25 MED ORDER — FENTANYL BOLUS VIA INFUSION
25.0000 ug | INTRAVENOUS | Status: DC | PRN
  Administered 2020-11-26 – 2020-11-27 (×4): 25 ug via INTRAVENOUS
  Filled 2020-11-25: qty 25

## 2020-11-25 MED ORDER — NOREPINEPHRINE 4 MG/250ML-% IV SOLN
INTRAVENOUS | Status: AC
  Administered 2020-11-25: 2 ug/min via INTRAVENOUS
  Filled 2020-11-25: qty 250

## 2020-11-25 MED ORDER — SODIUM CHLORIDE 0.9% FLUSH
3.0000 mL | Freq: Once | INTRAVENOUS | Status: AC
  Administered 2020-11-25: 3 mL via INTRAVENOUS

## 2020-11-25 MED ORDER — LORAZEPAM 2 MG/ML IJ SOLN
INTRAMUSCULAR | Status: AC
  Administered 2020-11-25: 1 mg via INTRAVENOUS
  Filled 2020-11-25: qty 1

## 2020-11-25 MED ORDER — NOREPINEPHRINE 4 MG/250ML-% IV SOLN: 0.0000 ug/min | INTRAVENOUS | Status: DC

## 2020-11-25 MED ORDER — ASPIRIN 81 MG PO CHEW
81.0000 mg | CHEWABLE_TABLET | Freq: Every day | ORAL | Status: DC
  Administered 2020-11-26 – 2020-12-02 (×6): 81 mg
  Filled 2020-11-25 (×6): qty 1

## 2020-11-25 MED ORDER — SODIUM CHLORIDE 0.9 % IV SOLN
250.0000 mg | Freq: Once | INTRAVENOUS | Status: AC
  Administered 2020-11-25: 250 mg via INTRAVENOUS
  Filled 2020-11-25: qty 2

## 2020-11-25 MED ORDER — MIDAZOLAM HCL 2 MG/2ML IJ SOLN
1.0000 mg | INTRAMUSCULAR | Status: DC | PRN
  Administered 2020-11-26: 2 mg via INTRAVENOUS
  Administered 2020-11-26: 1 mg via INTRAVENOUS
  Administered 2020-11-26: 2 mg via INTRAVENOUS
  Administered 2020-11-26: 1 mg via INTRAVENOUS
  Administered 2020-11-26: 2 mg via INTRAVENOUS
  Filled 2020-11-25 (×5): qty 2

## 2020-11-25 MED ORDER — DIPHENHYDRAMINE HCL 50 MG/ML IJ SOLN
INTRAMUSCULAR | Status: AC
  Filled 2020-11-25: qty 1

## 2020-11-25 MED ORDER — DEXTROSE 5 % IV SOLN
400.0000 mg | Freq: Two times a day (BID) | INTRAVENOUS | Status: DC
  Administered 2020-11-25 – 2020-11-29 (×8): 400 mg via INTRAVENOUS
  Filled 2020-11-25 (×9): qty 8

## 2020-11-25 MED ORDER — SODIUM CHLORIDE 0.9 % IV SOLN
2.0000 g | Freq: Two times a day (BID) | INTRAVENOUS | Status: DC
  Administered 2020-11-26 – 2020-11-29 (×8): 2 g via INTRAVENOUS
  Filled 2020-11-25 (×8): qty 20

## 2020-11-25 MED ORDER — VANCOMYCIN HCL IN DEXTROSE 1-5 GM/200ML-% IV SOLN
1000.0000 mg | Freq: Once | INTRAVENOUS | Status: AC
  Administered 2020-11-25: 1000 mg via INTRAVENOUS
  Filled 2020-11-25: qty 200

## 2020-11-25 MED ORDER — METHYLPREDNISOLONE SODIUM SUCC 125 MG IJ SOLR
INTRAMUSCULAR | Status: AC
  Filled 2020-11-25: qty 2

## 2020-11-25 MED ORDER — CALCIUM GLUCONATE-NACL 1-0.675 GM/50ML-% IV SOLN
1.0000 g | Freq: Once | INTRAVENOUS | Status: AC
  Administered 2020-11-25: 1000 mg via INTRAVENOUS
  Filled 2020-11-25: qty 50

## 2020-11-25 MED ORDER — LEVETIRACETAM IN NACL 500 MG/100ML IV SOLN
500.0000 mg | Freq: Two times a day (BID) | INTRAVENOUS | Status: DC
  Administered 2020-11-25 – 2020-12-02 (×14): 500 mg via INTRAVENOUS
  Filled 2020-11-25 (×14): qty 100

## 2020-11-25 MED ORDER — ETOMIDATE 2 MG/ML IV SOLN
INTRAVENOUS | Status: AC | PRN
  Administered 2020-11-25: 10 mg via INTRAVENOUS

## 2020-11-25 MED ORDER — VANCOMYCIN HCL 750 MG/150ML IV SOLN
750.0000 mg | INTRAVENOUS | Status: DC
  Administered 2020-11-26 – 2020-11-28 (×3): 750 mg via INTRAVENOUS
  Filled 2020-11-25 (×3): qty 150

## 2020-11-25 MED ORDER — POLYETHYLENE GLYCOL 3350 17 G PO PACK: 17.0000 g | PACK | Freq: Every day | ORAL | Status: DC | PRN

## 2020-11-25 MED ORDER — PANTOPRAZOLE SODIUM 40 MG IV SOLR
40.0000 mg | Freq: Every day | INTRAVENOUS | Status: DC
  Administered 2020-11-25 – 2020-11-29 (×5): 40 mg via INTRAVENOUS
  Filled 2020-11-25 (×5): qty 40

## 2020-11-25 MED ORDER — LEVETIRACETAM IN NACL 1000 MG/100ML IV SOLN
1000.0000 mg | Freq: Once | INTRAVENOUS | Status: AC
  Administered 2020-11-25: 1000 mg via INTRAVENOUS

## 2020-11-25 MED ORDER — ATORVASTATIN CALCIUM 10 MG PO TABS
10.0000 mg | ORAL_TABLET | Freq: Every day | ORAL | Status: DC
  Administered 2020-11-26 – 2020-12-05 (×9): 10 mg
  Filled 2020-11-25 (×9): qty 1

## 2020-11-25 MED ORDER — LORAZEPAM 2 MG/ML IJ SOLN: 1.0000 mg | Freq: Once | INTRAMUSCULAR | Status: AC

## 2020-11-25 MED ORDER — DEXTROSE 5 % IV SOLN: 5.0000 mg/kg | Freq: Three times a day (TID) | INTRAVENOUS | Status: DC

## 2020-11-25 MED ORDER — ROCURONIUM BROMIDE 50 MG/5ML IV SOLN
INTRAVENOUS | Status: AC | PRN
  Administered 2020-11-25: 60 mg via INTRAVENOUS

## 2020-11-25 NOTE — ED Notes (Signed)
Family at bedside. 

## 2020-11-25 NOTE — ED Provider Notes (Signed)
[facility] EMERGENCY DEPARTMENT Provider Note   CSN: [id number] Arrival date & time: 11/25/20  1437     History No chief complaint on file.   Kristine Francis is a 80 y.o. female.  Patient with history of asthma, high blood pressure, stroke with minimal deficits, ataxia presents with EMS for concern for possible stroke.  EMS called code stroke in the field after patient had episode of being unresponsive witnessed by grandchildren followed by slumping forward.  On route generalized shaking just before arrival in the emergency room.  For EMS patient did have right-sided weakness/symptoms.  Patient is a full code.  Unable to get details from patient due to acuity of situation and unresponsive.        Past Medical History:  Diagnosis Date  . Allergy   . Asthma    sees Dr. Tiajuana Amass - as child per pt  . At high risk for falls    unstable gait   . Ataxia   . Cataract    removed bilaterally   . Diverticulosis   . Emphysema   . Gait abnormality 05/14/2013   KristineFrancis a patient of Dr. Linda Hedges presented first interpreter thousand 13 with a gait dysfunction, she also had an episodic confusion and Loss device. Exam found a mild nystagmus and she was referred to ophthalmology on 03-2812, brain MRI was normal nystagmus was a secondary diagnosis to vestibulitis, ear nose and throat has followed and had seen the patient. At the time of the nystagmus the patie  . GERD (gastroesophageal reflux disease)   . Hepatic steatosis   . Hyperlipidemia    on atorvastatin   . Hypertension    on losartan   . Iron deficiency anemia, unspecified   . Kidney cysts 11/16/12   small cyst on left  . Nystagmus, end-position   . Osteopenia   . Renal cyst   . Stroke ([place]) 01/2017   mild     Patient Active Problem List   Diagnosis Date Noted  . Groin mass 08/20/2020  . Falls infrequently 03/27/2019  . Sensory neuropathy 02/01/2019  . 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 Cerebellar  ataxia ([place]) 08/23/2018  . Sensory polyneuropathy 08/23/2018  . Incontinence of feces   . Visual disturbances 02/06/2017  . Essential hypertension 11/07/2016  . Hyperlipidemia 04/24/2015  . Dysuria 02/11/2015  . Acute cystitis without hematuria 02/11/2015  . Chest pain 02/06/2015  . Left inguinal hernia 08/02/2013  . Gait abnormality 05/14/2013  . Gait disorder   . Nystagmus, end-position   . Routine health maintenance 12/13/2011  . LEG CRAMPS, NOCTURNAL 12/07/2010  . CALLUS, TOE 03/19/2010  . hip pain 03/19/2010  . ATAXIA 11/17/2008  . EMPHYSEMA 10/21/2008  . CERVICAL RADICULOPATHY, RIGHT 10/21/2008  . DIVERTICULOSIS OF COLON 08/14/2008  . RENAL CYST 08/14/2008  . ANEMIA-IRON DEFICIENCY 06/16/2007  . Asthma 06/16/2007  . GERD 06/16/2007  . OSTEOPENIA 06/16/2007    Past Surgical History:  Procedure Laterality Date  . ANAL RECTAL MANOMETRY N/A 12/20/2017   Procedure: ANO RECTAL MANOMETRY;  Surgeon: Mauri Pole, MD;  Location: [facility];  Service: Endoscopy;  Laterality: N/A;  . BREAST BIOPSY Bilateral 1970's  . CATARACT EXTRACTION Bilateral LT:02/14/13,RT:02/21/13  . CERVICAL SPINE SURGERY  2010  . COLONOSCOPY  03-12-14   per Dr. Olevia Perches, diverticulosis only, repeat 5 yrs   . EYE SURGERY Bilateral 01/2013&02/2013   left then right  . HERNIA REPAIR  Oct '14-left, remote-right   years ago right; left done '14  .  SKIN CANCER EXCISION  05/13/13   FACE, basal cell  . TONSILLECTOMY       OB History   No obstetric history on file.     Family History  Problem Relation Age of Onset  . Colon cancer Father   . Coronary artery disease Father   . Heart block Father   . Pancreatic cancer Mother   . Throat cancer Mother   . Colon cancer Paternal Grandmother   . Colon cancer Paternal Aunt        x 2  . Cancer - Lung Paternal Aunt   . Breast cancer Paternal Aunt   . Colon cancer Paternal Aunt   . Ovarian cancer Maternal Aunt   . Heart attack Paternal Grandfather   .  Esophageal cancer Other   . Colon polyps Neg Hx   . Rectal cancer Neg Hx   . Stomach cancer Neg Hx     Social History   Tobacco Use  . Smoking status: Never Smoker  . Smokeless tobacco: Never Used  Substance Use Topics  . Alcohol use: No    Alcohol/week: 0.0 standard drinks  . Drug use: No    Home Medications Prior to Admission medications   Medication Sig Start Date End Date Taking? Authorizing Provider  amLODipine (NORVASC) 10 MG tablet Take 1 tablet (10 mg total) by mouth daily. 10/08/18   [name], MD  aspirin 325 MG tablet Take 325 mg by mouth daily.    [provider]  aspirin 81 MG chewable tablet Chew by mouth daily.    [provider]  atorvastatin (LIPITOR) 10 MG tablet Take 1 tablet (10 mg total) by mouth daily. 10/08/18 10/08/19  [name], MD  azelastine (ASTELIN) 0.1 % nasal spray azelastine 137 mcg (0.1 %) nasal spray aerosol    [provider]  CALCIUM-MAGNESIUM-ZINC PO Take 1 tablet by mouth 3 (three) times daily.    [provider]  Cholecalciferol (VITAMIN D PO) Take 1 capsule by mouth daily.    [provider]  DHA-EPA-Vit B6-B12-Folic Acid (CARDIOVID PLUS) CAPS Take 1 capsule by mouth daily.    [provider]  EPINEPHrine 0.3 mg/0.3 mL IJ SOAJ injection SMARTSIG:0.3 Milligram(s) IM Once PRN 08/21/20   [provider]  ferrous sulfate 325 (65 FE) MG tablet Take 1 tablet (325 mg total) by mouth 2 (two) times daily with a meal. 10/12/18   [name], MD  fexofenadine (ALLEGRA) 180 MG tablet Take 180 mg by mouth daily. Mostly in the Fall    [provider]  furosemide (LASIX) 20 MG tablet Take 1 tablet (20 mg total) by mouth daily. 10/08/18   [name], MD  Glucosamine Sulfate 1000 MG TABS Take 1 tablet by mouth daily.     [provider]  ibuprofen (ADVIL) 800 MG tablet Take 800 mg by mouth 3 (three) times daily as needed. 05/19/20   [provider]   ketoconazole (NIZORAL) 2 % cream Apply 1 application topically 2 (two) times daily as needed for irritation. 07/14/20   [name], MD  losartan (COZAAR) 50 MG tablet Take 1 tablet (50 mg total) by mouth daily. 10/08/18   [name], MD  Lysine 500 MG TABS Take 1 tablet by mouth daily.     [provider]  Multiple Vitamins-Minerals (CENTRUM SILVER PO) Take 1 capsule by mouth daily.      [provider]  nitrofurantoin, macrocrystal-monohydrate, (MACROBID) 100 MG capsule Take  1 capsule (100 mg total) by mouth 2 (two) times daily. 08/20/19   [name], MD  Nutritional Supplements (ADRENAL COMPLEX PO) Take 1 capsule by mouth daily.    [provider]  omeprazole (PRILOSEC) 20 MG capsule Take 1 capsule (20 mg total) by mouth daily. 10/08/18   [name], MD  predniSONE (DELTASONE) 20 MG tablet Take 60 mg by mouth daily with breakfast.    [provider]  urea (CARMOL) 40 % CREA Apply twice daily to rough / callused areas of feet 08/21/20   McDonald, Stephan Minister, DPM    Allergies    Contrast media [iodinated diagnostic agents], Iohexol, and Lisinopril  Review of Systems   Review of Systems  Unable to perform ROS: Acuity of condition    Physical Exam Updated Vital Signs Ht _0  (1.549 m)   Wt 39.8 kg   BMI 16.58 kg/m   Physical Exam Vitals and nursing note reviewed.  Constitutional:      Appearance: She is well-developed and well-nourished. She is ill-appearing.  HENT:     Head: Normocephalic and atraumatic.  Eyes:     General:        Right eye: No discharge.        Left eye: No discharge.  Neck:     Trachea: No tracheal deviation.  Cardiovascular:     Rate and Rhythm: Normal rate and regular rhythm.  Pulmonary:     Breath sounds: Normal breath sounds.  Abdominal:     General: There is no distension.     Palpations: Abdomen is soft.     Tenderness: There is no abdominal tenderness. There is no guarding.  Musculoskeletal:         General: No swelling or edema.     Cervical back: Normal range of motion and neck supple.  Skin:    General: Skin is warm.     Capillary Refill: Capillary refill takes less than 2 seconds.     Coloration: Skin is pale.     Findings: No rash.  Neurological:     GCS: GCS eye subscore is 1. GCS verbal subscore is 1. GCS motor subscore is 2.     Cranial Nerves: Cranial nerve deficit present.     Comments: Patient has equal pupils minimally responsive to light 2 to 3 mm.  Patient would not follow commands  Psychiatric:        Mood and Affect: Mood and affect normal.     Comments: Unresponsive     ED Results / Procedures / Treatments   Labs (all labs ordered are listed, but only abnormal results are displayed) Labs Reviewed  COMPREHENSIVE METABOLIC PANEL - Abnormal; Notable for the following components:      Result Value   Glucose, Bld 172 (*)    Total Bilirubin 1.8 (*)    All other components within normal limits  I-STAT CHEM 8, ED - Abnormal; Notable for the following components:   Glucose, Bld 170 (*)    Calcium, Ion 1.13 (*)    All other components within normal limits  CBG MONITORING, ED - Abnormal; Notable for the following components:   Glucose-Capillary 157 (*)    All other components within normal limits  CBG MONITORING, ED - Abnormal; Notable for the following components:   Glucose-Capillary 206 (*)    All other components within normal limits  RESP PANEL BY RT-PCR (RSV, FLU A&B, COVID)  RVPGX2  PROTIME-INR  APTT  CBC  DIFFERENTIAL  TRIGLYCERIDES    EKG None  Radiology DG Chest Portable 1 View  Result Date: 11/25/2020 CLINICAL DATA:  Tube placement EXAM: PORTABLE CHEST 1 VIEW COMPARISON:  2013 FINDINGS: Endotracheal tube overlies the midthoracic trachea. Enteric tube passes below the diaphragm with tip out of field of view. Multiple monitoring leads/pads overlie the chest. Hyperinflated lungs with interstitial prominence likely reflecting COPD. No focal  consolidation. No pleural effusion or pneumothorax. Heart size is normal. IMPRESSION: Lines and tubes as above. COPD. No acute process in the chest. Electronically Signed   By: Macy Mis M.D.   On: 11/25/2020 15:07   CT HEAD CODE STROKE WO CONTRAST  Result Date: 11/25/2020 CLINICAL DATA:  Code stroke.  Right-sided weakness EXAM: CT HEAD WITHOUT CONTRAST TECHNIQUE: Contiguous axial images were obtained from the base of the skull through the vertex without intravenous contrast. COMPARISON:  Correlation made with MRI brain 2018 FINDINGS: Mild motion artifact is present. Brain: There is no acute intracranial hemorrhage, mass effect, or edema. Gray-white differentiation is preserved. 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 Additional patchy hypoattenuation in the supratentorial white matter is nonspecific but probably reflects mild chronic microvascular ischemic changes. Prominence of the ventricles and sulci reflects minor generalized parenchymal volume loss. No extra-axial collection. Vascular: No hyperdense vessel. There is mild intracranial atherosclerotic calcification at the skull base. Skull: Unremarkable. Sinuses/Orbits: Aerated.  No acute abnormality of the orbits. Other: Mastoid air cells are clear. ASPECTS ([place] Stroke Program Early CT Score) - Ganglionic level infarction (caudate, lentiform nuclei, internal capsule, insula, M1-M3 cortex): 7 - Supraganglionic infarction (M4-M6 cortex): 3 Total score (0-10 with 10 being normal): 10 IMPRESSION: There is no acute intracranial hemorrhage or evidence of acute infarction. ASPECT score is 10. These results were communicated to Dr. Lorrin Goodell at 3:21 pm on 11/25/2020 by text page via the [facility] messaging system. Electronically Signed   By: Macy Mis M.D.   On: 11/25/2020 15:25    Procedures .Critical Care Performed by: Elnora Morrison, MD Authorized by: Elnora Morrison, MD   Critical care provider statement:     Critical care time (minutes):  40   Critical care start time:  11/25/2020 3:03 PM   Critical care end time:  11/25/2020 3:43 PM   Critical care time was exclusive of:  Separately billable procedures and treating other patients and teaching time   Critical care was necessary to treat or prevent imminent or life-threatening deterioration of the following conditions:  CNS failure or compromise and respiratory failure   Critical care was time spent personally by me on the following activities:  Discussions with consultants, evaluation of patient's response to treatment, examination of patient, ordering and performing treatments and interventions, ordering and review of laboratory studies, ordering and review of radiographic studies, pulse oximetry, re-evaluation of patient's condition, obtaining history from patient or surrogate and review of old charts Procedure Name: Intubation Date/Time: 11/25/2020 3:43 PM Performed by: Elnora Morrison, MD Pre-anesthesia Checklist: Patient identified, Emergency Drugs available and Suction available Oxygen Delivery Method: Non-rebreather mask Preoxygenation: Pre-oxygenation with 100% oxygen Induction Type: Rapid sequence Ventilation: Mask ventilation without difficulty Laryngoscope Size: Mac and 3 Grade View: Grade I Tube size: 7.0 mm Number of attempts: 1 Placement Confirmation: ETT inserted through vocal cords under direct vision,  Positive ETCO2,  CO2 detector and Breath sounds checked- equal and bilateral Secured at: 23 cm    CPR  Date/Time: 11/25/2020 3:44 PM Performed by: Elnora Morrison, MD Authorized by: Elnora Morrison, MD  CPR Procedure Details:    CPR/ACLS performed in the ED: Yes     Duration of CPR (minutes):  1 CPR performed via ACLS guidelines under my direct supervision.  See RN documentation for details including defibrillator use, medications, doses and timing.   (including critical care time)  Medications Ordered in ED Medications   sodium chloride flush (NS) 0.9 % injection 3 mL (has no administration in time range)  LORazepam (ATIVAN) 2 MG/ML injection (has no administration in time range)  propofol (DIPRIVAN) 1000 MG/100ML infusion (has no administration in time range)  diphenhydrAMINE (BENADRYL) capsule 50 mg (has no administration in time range)  methylPREDNISolone sodium succinate (SOLU-MEDROL) 250 mg in sodium chloride 0.9 % 50 mL IVPB (has no administration in time range)  diphenhydrAMINE (BENADRYL) 50 MG/ML injection (has no administration in time range)  methylPREDNISolone sodium succinate (SOLU-MEDROL) 125 mg/2 mL injection (has no administration in time range)  etomidate (AMIDATE) injection (10 mg Intravenous Given 11/25/20 1448)    ED Course  I have reviewed the triage vital signs and the nursing notes.  Pertinent labs & imaging results that were available during my care of the patient were reviewed by me and considered in my medical decision making (see chart for details).    MDM Rules/Calculators/A&P                          Patient presents with EMS after episode of not feeling well followed by becoming unresponsive.  Patient had seizure on arrival followed by brief cardiac arrest. Neurology at bedside with nursing staff and myself.  Patient given nonrebreather for oxygenation while second IV placed by nursing staff.  CPR initiated 1 round by nursing staff coordinated by myself and we were able to get pulse return. Discussed with pharmacy, patient intubated with etomidate and rocuronium, plan for fentanyl and/or propofol as needed. Discussed with neurology and they are following along, patient sent CT scan and reviewed no acute bleeding.  CT angiogram pending. Glucose 172, electrolytes normal, normal kidney function. Portable chest x-ray reviewed, ET tube in proper position.  Cardiopulmonary Resuscitation (CPR) Procedure Note Directed/Performed by: Mariea Clonts I personally directed ancillary staff  and/or performed CPR in an effort to regain return of spontaneous circulation and to maintain cardiac, neuro and systemic perfusion.   Patient CARE signed out to continue to monitor, plan for ICU admission.  Updated patient's husband on severity of clinical situation.   Final Clinical Impression(s) / ED Diagnoses Final diagnoses:  Cardiac arrest [facility])  Acute stroke due to ischemia [facility])    Rx / DC Orders ED Discharge Orders    None       Elnora Morrison, MD 11/25/20 1545

## 2020-11-25 NOTE — H&P (Addendum)
NAME:  Kristine Francis, MRN:  [id number], DOB:  1941-07-10, LOS: 0 ADMISSION DATE:  11/25/2020, CONSULTATION DATE:  11/25/20 REFERRING MD:  Gardiner Rhyme  CHIEF COMPLAINT:  AMS   Brief History   Kristine Francis is a 80 y.o. female who was admitted 1/5 with AMS and probable seizures complicated by aspiration PNA.  She required intubation in ED. Of note, she had roughly 2 minutes CPR prior to intubation for pulselessness and apnea.  Pulses noted after 1 round, sinus tach.  History of present illness   Pt is encephelopathic; therefore, this HPI is obtained from chart review. Kristine Francis is a 80 y.o. female who has a PMH including but not limited to prior CVA in 2018, HTN, HLD, generalized gait abnormality due to balance problems (see "past medical history" for rest).  She presented to [facility] ED 1/5 with AMS.  She was at her home and granddaughter heard a scream then found her leaning down bent over on a table.  Husband returned to find her in same position and was unable to move her.    EMS was called and upon their arrival, she had R gaze deviation and aphasia.  She was brought to ED as a code stroke.  While in ED, she had a GTC seizure followed by aspiration and bradycardia.  She was subsequently intubated and was given 2mg  Ativan and 1g Keppra. Of note, she had roughly 2 minutes CPR prior to intubation for pulselessness and apnea.  Pulses noted after 1 round, sinus tach.  CTH and CTAH were negative for infarct and / or LVO.  She was started on cEEG by neurology and PCCM was called for admission.  Per husband, she had been her usual self prior to this.  No complaints at all.  No hx of prior seizures.  Past Medical History  has ANEMIA-IRON DEFICIENCY; EMPHYSEMA; Asthma; GERD; DIVERTICULOSIS OF COLON; RENAL CYST; CALLUS, TOE; hip pain; CERVICAL RADICULOPATHY, RIGHT; OSTEOPENIA; ATAXIA; LEG CRAMPS, NOCTURNAL; Routine health maintenance; Gait disorder; Nystagmus, end-position; Gait abnormality; Left  inguinal hernia; Chest pain; Dysuria; Acute cystitis without hematuria; Hyperlipidemia; Essential hypertension; Visual disturbances; Incontinence of feces; Falls infrequently; Groin mass; Luetscher's syndrome; Cerebellar ataxia (HCC); Sensory neuropathy; Sensory polyneuropathy; and Seizures (HCC) on their problem list.  Significant Hospital Events   1/5 > admit.  Consults:  Neuro, PCCM.  Procedures:  ETT 1/5 >   Significant Diagnostic Tests:  CTH 1/5 > neg for acute infarct or hemorrhage. CTAH 1/5 > no LVO. cEEG 1/5 >  MRI brain 1/5 >   Micro Data:  COVID 1/5 > neg. Flu 1/5 > neg. Blood 1/5 >  Sputum 1/5 >   Antimicrobials:  Vanc1/5 >   Ceftriaxone 1/5 > Ampicillin 1/5 >  Acyclovir 1/5 >   Interim history/subjective:  Sedated on Fentanyl 50. Was on Propofol 20 earlier then got hypotensive.  Propofol stopped and BP gradually improving since then (current SBP 118)  Objective:  Blood pressure (!) 62/53, pulse 85, temperature (!) 97 F (36.1 C), temperature source Temporal, resp. rate 18, height 5\' 1"  (1.549 m), weight 39.8 kg, SpO2 98 %.    Vent Mode: PRVC FiO2 (%):  [100 %] 100 % Set Rate:  [18 bmp] 18 bmp Vt Set:  [380 mL] 380 mL PEEP:  [8 cmH20] 8 cmH20 Plateau Pressure:  [21 cmH20] 21 cmH20   Intake/Output Summary (Last 24 hours) at 11/25/2020 1759 Last data filed at 11/25/2020 1457 Gross per 24 hour  Intake 100 ml  Output --  Net 100 ml   Filed Weights   11/25/20 1500 11/25/20 1521  Weight: 40 kg 39.8 kg    Examination: General: Adult female, elderly and frail, in NAD. Neuro: Sedated, not responsive. HEENT: [place]/AT. Sclerae anicteric.  ETT in place. Cardiovascular: RRR, no M/R/G.  Lungs: Respirations even and unlabored.  Coarse bases. Abdomen: BS x 4, soft, NT/ND.  Musculoskeletal: No gross deformities, no edema.  Skin: Intact, warm, no rashes.  Assessment & Plan:   AMS - presumed 2/2 new onset first time seizures with post ictal state.   Hx prior CVA  2018. - cEEG per neuro. - F/u on MRI brain. - AED's per neuro. - Check UA, blood and sputum cultures. - Empiric meningitis coverage for now.  If MRI negative, will need to consider LP. - Continue home ASA.  Respiratory insufficiency - due to inability to protect the airway 2/2 above. - Full vent support. - Assess ABG. - Wean as able. - Daily SBT. - Bronchial hygiene. - Follow CXR.  Aspiration PNA - 2/2 above. - Empiric abx as above. - Follow cultures.  Brief cardiac arrest - prior to intubation there was loss of pulses with apnea.  Pulses noted with sinus tach after 1 round CPR. - Supportive care. - Trend trops. - Can consider echo.  Hx HTN, HLD. - Continue home ASA, Atorvastatin. - Hold home Amlodipine, Furosemide, Losartan.  Hypocalcemia. - 1g Ca gluconate.  Hyperglycemia. - SSI. - Check Hgb A1c.  Best Practice (evaluated daily):  Diet: NPO. Pain/Anxiety/Delirium protocol (if indicated): Fentanyl gtt / Midazolam PRN.  RASS goal -1. VAP protocol (if indicated): In place. DVT prophylaxis: SCD's / Heparin. GI prophylaxis: PPI. Glucose control: SSI. Mobility: Bedrest. Disposition: ICU.  Goals of Care:  Last date of multidisciplinary goals of care discussion: None.   Husband updated at bedside in ED. Family and staff present: None. Summary of discussion: None. Follow up goals of care discussion due: 12/01/20. Code Status:  Full.  Labs   CBC: Recent Labs  Lab 11/25/20 1439 11/25/20 1451  WBC 8.8  --   NEUTROABS 6.1  --   HGB 13.9 14.3  HCT 42.3 42.0  MCV 92.2  --   PLT 277  --    Basic Metabolic Panel: Recent Labs  Lab 11/25/20 1439 11/25/20 1451  NA 137 138  K 3.6 3.5  CL 100 99  CO2 24  --   GLUCOSE 172* 170*  BUN 16 18  CREATININE 0.89 0.70  CALCIUM 9.5  --    GFR: Estimated Creatinine Clearance: 36.4 mL/min (by C-G formula based on SCr of 0.7 mg/dL). Recent Labs  Lab 11/25/20 1439  WBC 8.8   Liver Function Tests: Recent Labs  Lab  11/25/20 1439  AST 22  ALT 15  ALKPHOS 63  BILITOT 1.8*  PROT 6.8  ALBUMIN 3.8   No results for input(s): LIPASE, AMYLASE in the last 168 hours. No results for input(s): AMMONIA in the last 168 hours. ABG    Component Value Date/Time   PHART 7.366 11/25/2020 1715   PCO2ART 45.1 11/25/2020 1715   PO2ART 141 (H) 11/25/2020 1715   HCO3 25.5 11/25/2020 1715   TCO2 26 11/25/2020 1451   O2SAT 98.7 11/25/2020 1715    Coagulation Profile: Recent Labs  Lab 11/25/20 1439  INR 1.0   Cardiac Enzymes: No results for input(s): CKTOTAL, CKMB, CKMBINDEX, TROPONINI in the last 168 hours. HbA1C: Hgb A1c MFr Bld  Date/Time Value Ref Range Status  08/24/2015  03:36 PM 6.0 4.6 - 6.5 % Final    Comment:    Glycemic Control Guidelines for People with Diabetes:Non Diabetic:  <6%Goal of Therapy: <7%Additional Action Suggested:  >8%    CBG: Recent Labs  Lab 11/25/20 1442 11/25/20 1500  GLUCAP 157* 206*    Review of Systems:   Unable to obtain as pt is encephalopathic.  Past medical history  She,  has a past medical history of Allergy, Asthma, At high risk for falls, Ataxia, Cataract, Diverticulosis, Emphysema, Gait abnormality (05/14/2013), GERD (gastroesophageal reflux disease), Hepatic steatosis, Hyperlipidemia, Hypertension, Iron deficiency anemia, unspecified, Kidney cysts (11/16/12), Nystagmus, end-position, Osteopenia, Renal cyst, and Stroke ([place]) (01/2017).   Surgical History    Past Surgical History:  Procedure Laterality Date  . ANAL RECTAL MANOMETRY N/A 12/20/2017   Procedure: ANO RECTAL MANOMETRY;  Surgeon: Mauri Pole, MD;  Location: [facility];  Service: Endoscopy;  Laterality: N/A;  . BREAST BIOPSY Bilateral 1970's  . CATARACT EXTRACTION Bilateral LT:02/14/13,RT:02/21/13  . CERVICAL SPINE SURGERY  2010  . COLONOSCOPY  03-12-14   per Dr. Olevia Perches, diverticulosis only, repeat 5 yrs   . EYE SURGERY Bilateral 01/2013&02/2013   left then right  . HERNIA REPAIR  Oct  '14-left, remote-right   years ago right; left done '14  . SKIN CANCER EXCISION  05/13/13   FACE, basal cell  . TONSILLECTOMY       Social History   reports that she has never smoked. She has never used smokeless tobacco. She reports that she does not drink alcohol and does not use drugs.   Family history   Her family history includes Breast cancer in her paternal aunt; Cancer - Lung in her paternal aunt; Colon cancer in her father, paternal aunt, paternal aunt, and paternal grandmother; Coronary artery disease in her father; Esophageal cancer in an other family member; Heart attack in her paternal grandfather; Heart block in her father; Ovarian cancer in her maternal aunt; Pancreatic cancer in her mother; Throat cancer in her mother. There is no history of Colon polyps, Rectal cancer, or Stomach cancer.   Allergies Allergies  Allergen Reactions  . Contrast Media [Iodinated Diagnostic Agents] Other (See Comments)    Congestion .Marland KitchenMarland Kitchenpatient stated it irritated her eyes and voice  . Iohexol Other (See Comments)    Gi upset  . Lisinopril Cough     Home meds  Prior to Admission medications   Medication Sig Start Date End Date Taking? Authorizing Provider  amLODipine (NORVASC) 10 MG tablet Take 1 tablet (10 mg total) by mouth daily. 10/08/18   [name], MD  aspirin 325 MG tablet Take 325 mg by mouth daily.    [provider]  aspirin 81 MG chewable tablet Chew by mouth daily.    [provider]  atorvastatin (LIPITOR) 10 MG tablet Take 1 tablet (10 mg total) by mouth daily. 10/08/18 10/08/19  [name], MD  azelastine (ASTELIN) 0.1 % nasal spray azelastine 137 mcg (0.1 %) nasal spray aerosol    [provider]  CALCIUM-MAGNESIUM-ZINC PO Take 1 tablet by mouth 3 (three) times daily.    [provider]  Cholecalciferol (VITAMIN D PO) Take 1 capsule by mouth daily.    [provider]  DHA-EPA-Vit B6-B12-Folic Acid (CARDIOVID PLUS) CAPS  Take 1 capsule by mouth daily.    [provider]  EPINEPHrine 0.3 mg/0.3 mL IJ SOAJ injection SMARTSIG:0.3 Milligram(s) IM Once PRN 08/21/20   [provider]  ferrous sulfate 325 (65  FE) MG tablet Take 1 tablet (325 mg total) by mouth 2 (two) times daily with a meal. 10/12/18   [name], MD  fexofenadine (ALLEGRA) 180 MG tablet Take 180 mg by mouth daily. Mostly in the Fall    [provider]  furosemide (LASIX) 20 MG tablet Take 1 tablet (20 mg total) by mouth daily. 10/08/18   [name], MD  Glucosamine Sulfate 1000 MG TABS Take 1 tablet by mouth daily.     [provider]  ibuprofen (ADVIL) 800 MG tablet Take 800 mg by mouth 3 (three) times daily as needed. 05/19/20   [provider]  ketoconazole (NIZORAL) 2 % cream Apply 1 application topically 2 (two) times daily as needed for irritation. 07/14/20   [name], MD  losartan (COZAAR) 50 MG tablet Take 1 tablet (50 mg total) by mouth daily. 10/08/18   [name], MD  Lysine 500 MG TABS Take 1 tablet by mouth daily.     [provider]  Multiple Vitamins-Minerals (CENTRUM SILVER PO) Take 1 capsule by mouth daily.      [provider]  nitrofurantoin, macrocrystal-monohydrate, (MACROBID) 100 MG capsule Take 1 capsule (100 mg total) by mouth 2 (two) times daily. 08/20/19   [name], MD  Nutritional Supplements (ADRENAL COMPLEX PO) Take 1 capsule by mouth daily.    [provider]  omeprazole (PRILOSEC) 20 MG capsule Take 1 capsule (20 mg total) by mouth daily. 10/08/18   [name], MD  predniSONE (DELTASONE) 20 MG tablet Take 60 mg by mouth daily with breakfast.    [provider]  urea (CARMOL) 40 % CREA Apply twice daily to rough / callused areas of feet 08/21/20   [name], DPM    Critical care time: 45 min.    Montey Hora, [facility] For pager details, please see AMION 11/25/2020, 5:59  PM

## 2020-11-25 NOTE — Code Documentation (Signed)
Patient at home taking care of grandchildren when the granddaughter heard her scream and found her slumped over the table. EMS called and paged a code stroke after assessing right weakness, right gaze, and AMS. Pt brought to [facility] ED and during the initial assessment began to seize for about 60 seconds. Pt rushed to room and moved over to stretcher and then lost a pulse. Chest compressions initiated and completed after 1-2 min with ROSC. Ativan was given for the seizure per MD order as well as Keppra. Pt was intubated and stabilized. Once cleared, pt taken for CT and CTA. ED MD spoke with family in conference room and shortly after Neurologist and SRN spoke with pt's husband and son as well. According to family pt has no prior hx of seizures but has had a stroke in the past and was treated at [facility]. Care Plan: EEG, MRI and admit with critical care. No TPA r/t no stroke suspected. Will need further workup to determine cause of seizures and to rule out other concerns. Hand off with Evangeline Gula.  Mayleen Borrero, [place]rape, RN

## 2020-11-25 NOTE — Progress Notes (Signed)
RIA in lab notified that ABG sample being sent down to be ran. RT will continue to monitor.

## 2020-11-25 NOTE — Progress Notes (Signed)
   11/25/20 1500  Clinical Encounter Type  Visited With Family;Patient not available;Health care provider  Visit Type Spiritual support  Referral From Physician  Consult/Referral To Chaplain  Spiritual Encounters  Spiritual Needs Prayer;Emotional  Stress Factors  Patient Stress Factors None identified  Family Stress Factors Major life changes  Chaplain met in Consult A with patient's husband and ED physician. Physician explained that patient is critical and brain scans are currently being run. Patient will probably be transferred to ICU whenever tests are completed. Chaplain offered presence and prayer support. Son is on the way.

## 2020-11-25 NOTE — Progress Notes (Signed)
EEG completed, results pending. 

## 2020-11-25 NOTE — Progress Notes (Signed)
Pt transported from ED RESUS to CT and back on the ventilator. RT and RN x2 accompanied pt.

## 2020-11-25 NOTE — ED Triage Notes (Signed)
Pt to ED via [facility] - called out as a "not feeling well" activated code stroke in the field-- when pt arrived to the bridge in the ED- she began seizing- unresponsive, had facial twitching and full body movements- moved to RESUS room- became pulseless and apneic on EMS stretcher- moved to ED stretcher- CPR started- bagged with BVM for approx 1-2 minutes. Compressions x 1-2 minutes- ROSC with in 2 minutes-  Placed on monitor- ST--  Gurgling resp, unable to maintain airway--  Etomidate 10mg  given at1448, Roc 60 given at 1448 Intubated per Dr. with 7.5 ETT.

## 2020-11-25 NOTE — Progress Notes (Signed)
ABG results given to Dr. Chestine Spore. Fio2 and peep weaned. RT will continue to monitor.

## 2020-11-25 NOTE — ED Notes (Addendum)
Mittens placed on hands- pt moving arms towards face, husband at bedside Per husband- pt is fully vaccinated and had her booster. She was feeling fine earlier today and made lunch for her grandchildren- then c/o "not feeling well" and collapsed .

## 2020-11-25 NOTE — Consult Note (Addendum)
NEUROLOGY CONSULTATION NOTE   Date of service: November 25, 2020 Patient Name: Kristine Francis MRN:  [id number] DOB:  1941-04-29 Reason for consult: "Stroke code" _ _ _   _ __   _ __ _ _  __ __   _ __   __ _  History of Present Illness  Kristine Francis is a 80 y.o. female with PMH significant for asthma, GERD, hepatic steatosis, HTN, HLD, chronic latent nystagmus who presents today with a seizure.  Patient was at her baseline, takes care of her grand kids and lives with her husband. Kristine Francis daughter heard a loud scream and then found the patient leaning down on the table. Husband who had just gone for 20 mins, returned to find her leaning on the table and unable to move her right side. EMS was called and she was brought in as a stroke code. She was noted by EMS to have a R gaze deviation and only following one step commands and concern for potential aphasia. As soon as she came in to the ED, she was noted to have copious amounts of secretions and before I could examine her at all, she had a GTC seizure.  Patient had an ictal cry > figure of 4 sign with her R hand up > R gaze deviation and then full body rhythmic GTC. During the seizure, she went bradycardic to 40s. She was taken to resuscitation bay and intubated. She was given 2mg  of Ativan and 1g of Keppra.  Workup with CTH and a CT Angio was obtained. CT Head was negative for a large territory infarct. She was premedicated with solumderol and Benadryl and a CT angio with contrast was obtained which was negative for LVO on my read.  On discussion with patient's husband, he reports that patient was at her baseline today.  No prior history of CNS infection, no prior CNS surgery, no prior history of seizures, no alcohol intake, no recreational substances, no new medications.  Kristine Francis: 1330 on 11/25/2020. MR S: 0. tPA: Not given as low suspicion for a stroke.  Concerned that her current episode was more consistent with a first-time seizure. Thrombectomy:  Not pursued due to low suspicion for a stroke, negative CTA.   ROS   Unable to obtain due to intubation and sedation.  Past History   Past Medical History:  Diagnosis Date  . Allergy   . Asthma    sees Dr. Tiajuana Amass - as child per pt  . At high risk for falls    unstable gait   . Ataxia   . Cataract    removed bilaterally   . Diverticulosis   . Emphysema   . Gait abnormality 05/14/2013   Ms.Sawinski a patient of Dr. Linda Hedges presented first interpreter thousand 13 with a gait dysfunction, she also had an episodic confusion and Loss device. Exam found a mild nystagmus and she was referred to ophthalmology on 03-2812, brain MRI was normal nystagmus was a secondary diagnosis to vestibulitis, ear nose and throat has followed and had seen the patient. At the time of the nystagmus the patie  . GERD (gastroesophageal reflux disease)   . Hepatic steatosis   . Hyperlipidemia    on atorvastatin   . Hypertension    on losartan   . Iron deficiency anemia, unspecified   . Kidney cysts 11/16/12   small cyst on left  . Nystagmus, end-position   . Osteopenia   . Renal cyst   . Stroke ([place])  01/2017   mild    Past Surgical History:  Procedure Laterality Date  . ANAL RECTAL MANOMETRY N/A 12/20/2017   Procedure: ANO RECTAL MANOMETRY;  Surgeon: Mauri Pole, MD;  Location: [facility];  Service: Endoscopy;  Laterality: N/A;  . BREAST BIOPSY Bilateral 1970's  . CATARACT EXTRACTION Bilateral LT:02/14/13,RT:02/21/13  . CERVICAL SPINE SURGERY  2010  . COLONOSCOPY  03-12-14   per Dr. Olevia Perches, diverticulosis only, repeat 5 yrs   . EYE SURGERY Bilateral 01/2013&02/2013   left then right  . HERNIA REPAIR  Oct '14-left, remote-right   years ago right; left done '14  . SKIN CANCER EXCISION  05/13/13   FACE, basal cell  . TONSILLECTOMY     Family History  Problem Relation Age of Onset  . Colon cancer Father   . Coronary artery disease Father   . Heart block Father   . Pancreatic cancer Mother    . Throat cancer Mother   . Colon cancer Paternal Grandmother   . Colon cancer Paternal Aunt        x 2  . Cancer - Lung Paternal Aunt   . Breast cancer Paternal Aunt   . Colon cancer Paternal Aunt   . Ovarian cancer Maternal Aunt   . Heart attack Paternal Grandfather   . Esophageal cancer Other   . Colon polyps Neg Hx   . Rectal cancer Neg Hx   . Stomach cancer Neg Hx    Social History   Socioeconomic History  . Marital status: Married    Spouse name: Not on file  . Number of children: 3  . Years of education: 47  . Highest education level: Not on file  Occupational History  . 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 Smoking status: Never Smoker  . Smokeless tobacco: Never Used  Substance and Sexual Activity  . Alcohol use: No    Alcohol/week: 0.0 standard drinks  . Drug use: No  . Sexual activity: Yes    Partners: Male  Other Topics Concern  . 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 married - 1965. 3 sons - '66, '69, '70. Sons in good health. Marriage in good health. Enjoys retirement - remains active.         Social Determinants of Health   Financial Resource Strain: Not on file  Food Insecurity: Not on file  Transportation Needs: Not on file  Physical Activity: Not on file  Stress: Not on file  Social Connections: Not on file   Allergies  Allergen Reactions  . Contrast Media [Iodinated Diagnostic Agents] Other (See Comments)    Congestion .Marland KitchenMarland Kitchenpatient stated it irritated her eyes and voice  . Iohexol Other (See Comments)    Gi upset  . Lisinopril Cough    Medications  (Not in a hospital admission)    Vitals   Vitals:   11/25/20 1450 11/25/20 1500 11/25/20 1521  Weight:  40 kg 39.8 kg  Height: 5\' 1"  (1.549 m)  5\' 1"  (1.549 m)     Body mass index is 16.58 kg/m.  Physical Exam   General: Laying comfortably in bed; in no acute  distress. HENT: Normal oropharynx and mucosa. Normal external appearance of ears and nose. Neck: Supple, no pain or tenderness CV: No JVD. No peripheral edema. Pulmonary: Symmetric Chest rise.  Intubated on vent. Abdomen: Soft to touch,  non-tender. Ext: No cyanosis, edema, or deformity Skin: No rash. Normal palpation of skin.  Musculoskeletal: Normal digits and nails by inspection. No clubbing.  Neurologic Examination on propofol, recent Ativan and etomidate.  Mental status/Cognition:  Intubated, does not respond to loud voice, no response to noxious stimuli.  Brainstem: Corneals weakly positive, pupils 3 mm bilaterally and reactive to light.  No cough.  No gag.  Motor/sensory:  Muscle bulk: Poor, tone hypotonic. No movements in any extremities to noxious stimuli or to nailbed pressure.   Labs   CBC:  Recent Labs  Lab 11/25/20 1439 11/25/20 1451  WBC 8.8  --   NEUTROABS 6.1  --   HGB 13.9 14.3  HCT 42.3 42.0  MCV 92.2  --   PLT 277  --     Basic Metabolic Panel:  Lab Results  Component Value Date   NA 138 11/25/2020   K 3.5 11/25/2020   CO2 31 10/08/2018   GLUCOSE 170 (H) 11/25/2020   BUN 18 11/25/2020   CREATININE 0.70 11/25/2020   CALCIUM 9.7 10/08/2018   GFRNONAA >60 01/26/2017   GFRAA >60 01/26/2017   Lipid Panel:  Lab Results  Component Value Date   LDLCALC 116 (H) 04/15/2016   HgbA1c:  Lab Results  Component Value Date   HGBA1C 6.0 08/24/2015   Urine Drug Screen: No results found for: LABOPIA, COCAINSCRNUR, LABBENZ, AMPHETMU, THCU, LABBARB  Alcohol Level No results found for: ETH  CT Head without contrast: CTH was negative for a large hypodensity concerning for a large territory infarct or hyperdensity concerning for an ICH  CT angio Head and Neck with contrast: No LVO.  MRI Brain pending.  Impression   Kristine Francis is a 80 y.o. female with PMH significant for asthma, GERD, hepatic steatosis, HTN, HLD, chronic latent nystagmus who  presents today with first time seizure, then intubated due to airway.  CT head and CTA negative for a large territory stroke.  Suspect that her episode was primarily a seizure rather than a new onset large territory stroke.  Unclear why she had a seizure today.  She did not have any signs or symptoms of an infection or fever over the last few days. No hypoglycemia, no significant electrolyte abnormality, She has been eating and drinking fine.  No rash concerning for potential shingles.  We will do work-up as below.  Recommendations  -Ativan 2 mg and Keppra 1 g given in the ED. - Started on Keppra 500mg  BID -Continuous EEG ordered. -MRI brain with and without contrast after continuous EEG. -Neurology will continue to follow. ______________________________________________________________________  This patient is critically ill and at significant risk of neurological worsening, death and care requires constant monitoring of vital signs, hemodynamics,respiratory and cardiac monitoring, neurological assessment, discussion with family, other specialists and medical decision making of high complexity. I spent 35 minutes of neurocritical care time  in the care of  this patient. This was time spent independent of any time provided by nurse practitioner or PA.  [facility] Pager Number Erick [phone] 11/25/2020  4:34 PM  Thank you for the opportunity to take part in the care of this patient. If you have any further questions, please contact the neurology consultation attending.  Signed,  01/23/2021 [facility] Pager Number Erick [phone] _ _ _   _ __   _ __ _ _  __ __   _ __   __ _

## 2020-11-25 NOTE — Progress Notes (Signed)
vLTM EEG started after spot EEG same leads. Notified Neuro 

## 2020-11-25 NOTE — Progress Notes (Signed)
Pharmacy Antibiotic Note  Kristine Francis is a 80 y.o. female admitted on 11/25/2020 with meningitis.  Pharmacy has been consulted for vancomycin, ceftriaxone, ampicillin, and acyclovir dosing.  Plan: Vancomycin 1000 mg IV loading dose then 750 mg every 24 hours.  Goal trough 15-20 mcg/mL. Will not use AUC dosing for meningitis. Ceftriaxone 2g IV q12h Ampicillin 2g IV q8h Acyclovir 400 mg IV q12h Monitor renal function, culture results, clinical improvement  Height: 5\' 1"  (154.9 cm) Weight: 39.8 kg (87 lb 11.9 oz) IBW/kg (Calculated) : 47.8  Temp (24hrs), Avg:97 F (36.1 C), Min:97 F (36.1 C), Max:97 F (36.1 C)  Recent Labs  Lab 11/25/20 1439 11/25/20 1451  WBC 8.8  --   CREATININE 0.89 0.70    Estimated Creatinine Clearance: 36.4 mL/min (by C-G formula based on SCr of 0.7 mg/dL).    Allergies  Allergen Reactions  . Contrast Media [Iodinated Diagnostic Agents] Other (See Comments)    Congestion .03/05/22Marland Kitchenpatient stated it irritated her eyes and voice  . Iohexol Other (See Comments)    Gi upset  . Lisinopril Cough    Antimicrobials this admission: Vanc 1/5 >>  Ceftriaxone 1/5 >>  Ampicillin 1/5 > Acyclovir 1/5 >>  Dose adjustments this admission:  Microbiology results: 1/5 BCx: pending 1/5 UCx: pending   Thank you for allowing pharmacy to be a part of this patient's care.  Marland Kitchen, PharmD PGY-1 Pharmacy Resident 11/25/2020 7:22 PM Please see AMION for all pharmacy numbers

## 2020-11-26 ENCOUNTER — Inpatient Hospital Stay (HOSPITAL_COMMUNITY): Payer: Medicare Other

## 2020-11-26 DIAGNOSIS — G9341 Metabolic encephalopathy: Secondary | ICD-10-CM | POA: Diagnosis not present

## 2020-11-26 DIAGNOSIS — R569 Unspecified convulsions: Secondary | ICD-10-CM | POA: Diagnosis not present

## 2020-11-26 DIAGNOSIS — J9601 Acute respiratory failure with hypoxia: Secondary | ICD-10-CM | POA: Diagnosis not present

## 2020-11-26 LAB — BASIC METABOLIC PANEL
Anion gap: 14 (ref 5–15)
BUN: 16 mg/dL (ref 8–23)
CO2: 20 mmol/L — ABNORMAL LOW (ref 22–32)
Calcium: 8.4 mg/dL — ABNORMAL LOW (ref 8.9–10.3)
Chloride: 105 mmol/L (ref 98–111)
Creatinine, Ser: 0.78 mg/dL (ref 0.44–1.00)
GFR, Estimated: >60 mL/min (ref >60)
Glucose, Bld: 124 mg/dL — ABNORMAL HIGH (ref 70–99)
Potassium: 3.8 mmol/L (ref 3.5–5.1)
Sodium: 139 mmol/L (ref 135–145)

## 2020-11-26 LAB — POCT I-STAT 7, (LYTES, BLD GAS, ICA,H+H)
Acid-base deficit: 2 mmol/L (ref 0.0–2.0)
Bicarbonate: 23.9 mmol/L (ref 20.0–28.0)
Calcium, Ion: 1.21 mmol/L (ref 1.15–1.40)
HCT: 34 % — ABNORMAL LOW (ref 36.0–46.0)
Hemoglobin: 11.6 g/dL — ABNORMAL LOW (ref 12.0–15.0)
O2 Saturation: 86 %
Patient temperature: 37
Potassium: 3.3 mmol/L — ABNORMAL LOW (ref 3.5–5.1)
Sodium: 139 mmol/L (ref 135–145)
TCO2: 25 mmol/L (ref 22–32)
pCO2 arterial: 45.3 mmHg (ref 32.0–48.0)
pH, Arterial: 7.33 — ABNORMAL LOW (ref 7.350–7.450)
pO2, Arterial: 56 mmHg — ABNORMAL LOW (ref 83.0–108.0)

## 2020-11-26 LAB — I-STAT ARTERIAL BLOOD GAS, ED
Acid-base deficit: 3 mmol/L — ABNORMAL HIGH (ref 0.0–2.0)
Bicarbonate: 23.5 mmol/L (ref 20.0–28.0)
Calcium, Ion: 1.18 mmol/L (ref 1.15–1.40)
HCT: 33 % — ABNORMAL LOW (ref 36.0–46.0)
Hemoglobin: 11.2 g/dL — ABNORMAL LOW (ref 12.0–15.0)
O2 Saturation: 97 %
Patient temperature: 96.8
Potassium: 3.4 mmol/L — ABNORMAL LOW (ref 3.5–5.1)
Sodium: 138 mmol/L (ref 135–145)
TCO2: 25 mmol/L (ref 22–32)
pCO2 arterial: 43.6 mmHg (ref 32.0–48.0)
pH, Arterial: 7.334 — ABNORMAL LOW (ref 7.350–7.450)
pO2, Arterial: 89 mmHg (ref 83.0–108.0)

## 2020-11-26 LAB — CBG MONITORING, ED
Glucose-Capillary: 167 mg/dL — ABNORMAL HIGH (ref 70–99)
Glucose-Capillary: 126 mg/dL — ABNORMAL HIGH (ref 70–99)
Glucose-Capillary: 156 mg/dL — ABNORMAL HIGH (ref 70–99)

## 2020-11-26 LAB — GLUCOSE, CAPILLARY
Glucose-Capillary: 109 mg/dL — ABNORMAL HIGH (ref 70–99)
Glucose-Capillary: 123 mg/dL — ABNORMAL HIGH (ref 70–99)
Glucose-Capillary: 156 mg/dL — ABNORMAL HIGH (ref 70–99)
Glucose-Capillary: 69 mg/dL — ABNORMAL LOW (ref 70–99)
Glucose-Capillary: 90 mg/dL (ref 70–99)

## 2020-11-26 LAB — CBC
HCT: 37.9 % (ref 36.0–46.0)
Hemoglobin: 12.8 g/dL (ref 12.0–15.0)
MCH: 31.4 pg (ref 26.0–34.0)
MCHC: 33.8 g/dL (ref 30.0–36.0)
MCV: 92.9 fL (ref 80.0–100.0)
Platelets: 263 10*3/uL (ref 150–400)
RBC: 4.08 MIL/uL (ref 3.87–5.11)
RDW: 13.3 % (ref 11.5–15.5)
WBC: 16 10*3/uL — ABNORMAL HIGH (ref 4.0–10.5)
nRBC: 0 % (ref 0.0–0.2)

## 2020-11-26 LAB — MAGNESIUM: Magnesium: 1.6 mg/dL — ABNORMAL LOW (ref 1.7–2.4)

## 2020-11-26 LAB — TRIGLYCERIDES: Triglycerides: 58 mg/dL (ref <150)

## 2020-11-26 LAB — PHOSPHORUS: Phosphorus: 3.5 mg/dL (ref 2.5–4.6)

## 2020-11-26 LAB — MRSA PCR SCREENING: MRSA by PCR: NEGATIVE

## 2020-11-26 MED ORDER — DEXTROSE 50 % IV SOLN
INTRAVENOUS | Status: AC
  Administered 2020-11-26: 12.5 g via INTRAVENOUS
  Filled 2020-11-26: qty 50

## 2020-11-26 MED ORDER — DEXTROSE 50 % IV SOLN: 12.5000 g | Freq: Once | INTRAVENOUS | Status: AC

## 2020-11-26 MED ORDER — PROPOFOL 1000 MG/100ML IV EMUL
5.0000 ug/kg/min | INTRAVENOUS | Status: DC
  Administered 2020-11-27: 10 ug/kg/min via INTRAVENOUS

## 2020-11-26 MED ORDER — POLYETHYLENE GLYCOL 3350 17 G PO PACK
17.0000 g | PACK | Freq: Every day | ORAL | Status: DC | PRN
  Filled 2020-11-26: qty 1

## 2020-11-26 MED ORDER — MAGNESIUM SULFATE 2 GM/50ML IV SOLN
2.0000 g | Freq: Once | INTRAVENOUS | Status: AC
  Administered 2020-11-26: 2 g via INTRAVENOUS
  Filled 2020-11-26: qty 50

## 2020-11-26 MED ORDER — CHLORHEXIDINE GLUCONATE 0.12% ORAL RINSE (MEDLINE KIT)
15.0000 mL | Freq: Two times a day (BID) | OROMUCOSAL | Status: DC
  Administered 2020-11-26 – 2020-11-28 (×5): 15 mL via OROMUCOSAL

## 2020-11-26 MED ORDER — PROPOFOL 1000 MG/100ML IV EMUL
5.0000 ug/kg/min | INTRAVENOUS | Status: DC
  Administered 2020-11-26: 5 ug/kg/min via INTRAVENOUS
  Filled 2020-11-26: qty 100

## 2020-11-26 MED ORDER — LACTATED RINGERS IV BOLUS
1000.0000 mL | Freq: Once | INTRAVENOUS | Status: AC
  Administered 2020-11-26: 1000 mL via INTRAVENOUS

## 2020-11-26 MED ORDER — NOREPINEPHRINE 4 MG/250ML-% IV SOLN
2.0000 ug/min | INTRAVENOUS | Status: DC
  Administered 2020-11-26: 8 ug/min via INTRAVENOUS
  Administered 2020-11-26: 2 ug/min via INTRAVENOUS
  Administered 2020-11-27: 6 ug/min via INTRAVENOUS
  Filled 2020-11-26 (×2): qty 250

## 2020-11-26 MED ORDER — ORAL CARE MOUTH RINSE
15.0000 mL | OROMUCOSAL | Status: DC
  Administered 2020-11-26 – 2020-11-28 (×21): 15 mL via OROMUCOSAL

## 2020-11-26 MED ORDER — DOCUSATE SODIUM 50 MG/5ML PO LIQD: 100.0000 mg | Freq: Two times a day (BID) | ORAL | Status: DC | PRN

## 2020-11-26 MED ORDER — CHLORHEXIDINE GLUCONATE CLOTH 2 % EX PADS
6.0000 | MEDICATED_PAD | Freq: Every day | CUTANEOUS | Status: DC
  Administered 2020-11-26 – 2020-12-07 (×12): 6 via TOPICAL

## 2020-11-26 MED ORDER — SODIUM CHLORIDE 0.9 % IV SOLN
250.0000 mL | INTRAVENOUS | Status: DC
  Administered 2020-11-26: 1000 mL via INTRAVENOUS
  Administered 2020-11-26 – 2020-11-30 (×2): 250 mL via INTRAVENOUS

## 2020-11-26 MED ORDER — GADOBUTROL 1 MMOL/ML IV SOLN
4.0000 mL | Freq: Once | INTRAVENOUS | Status: AC | PRN
  Administered 2020-11-26: 4 mL via INTRAVENOUS

## 2020-11-26 NOTE — Progress Notes (Signed)
vLTM EEG complete. Redness in the frontal leads however, no skin breakdown

## 2020-11-26 NOTE — Procedures (Addendum)
Patient Name: Kristine Francis  MRN: [id number]  Epilepsy Attending: Charlsie Quest  Referring Physician/Provider: Dr. Erick Blinks Duration:  11/25/2020 1723 to 11/26/2020 1228  Patient history: 80 year old female with new onset seizures.  EEG to evaluate for seizures.  Level of alertness: comatose  AEDs during EEG study: Keppra  Technical aspects: This EEG study was done with scalp electrodes positioned according to the 10-20 International system of electrode placement. Electrical activity was acquired at a sampling rate of 500Hz  and reviewed with a high frequency filter of 70Hz  and a low frequency filter of 1Hz . EEG data were recorded continuously and digitally stored.   Description: EEG showed continuous generalized 3 to 6 Hz theta-delta slowing admixed with 15 to 18 Hz generalized beta activity.  Sharp transients were seen in left frontotemporal region.  Hyperventilation and photic stimulation were not performed.     ABNORMALITY -Continuous slow, generalized  IMPRESSION: This study is suggestive of moderate to severe diffuse encephalopathy, nonspecific etiology.  No seizures or definite epileptiform discharges were seen throughout the recording.  [name] 

## 2020-11-26 NOTE — Progress Notes (Signed)
Echo attempted at 3:07, patient not in room. Will re-attempt tomorrow morning. [facility] Hillis Mcphatter RDCS

## 2020-11-26 NOTE — Progress Notes (Signed)
NAME:  Kristine Francis, MRN:  [id number], DOB:  1941-04-21, LOS: 1 ADMISSION DATE:  11/25/2020, CONSULTATION DATE:  11/25/20 REFERRING MD:  Gardiner Rhyme  CHIEF COMPLAINT:  AMS   Brief History   Kristine Francis is a 80 y.o. female who was admitted 1/5 with AMS and probable seizures complicated by aspiration PNA.  She required intubation in ED.Of note, she had roughly 2 minutes CPR prior to intubation for pulselessness and apnea.  Pulses noted after 1 round, sinus tach.  CTA and Head CT were negative for infarct. Started on continuous EEG by neurology and PCCM admitted. No prior history of seizures.   Past Medical History  has ANEMIA-IRON DEFICIENCY; EMPHYSEMA; Asthma; GERD; DIVERTICULOSIS OF COLON; RENAL CYST; CALLUS, TOE; hip pain; CERVICAL RADICULOPATHY, RIGHT; OSTEOPENIA; ATAXIA; LEG CRAMPS, NOCTURNAL; Routine health maintenance; Gait disorder; Nystagmus, end-position; Gait abnormality; Left inguinal hernia; Chest pain; Dysuria; Acute cystitis without hematuria; Hyperlipidemia; Essential hypertension; Visual disturbances; Incontinence of feces; Falls infrequently; Groin mass; Luetscher's syndrome; Cerebellar ataxia (HCC); Sensory neuropathy; Sensory polyneuropathy; Seizures (HCC); Acute encephalopathy; Acute respiratory failure with hypoxia (HCC); Aspiration into airway; and Cardiac arrest [facility]) on their problem list.  Significant Hospital Events   1/5 > admit.  Consults:  Neuro, PCCM.  Procedures:  ETT 1/5 >   Significant Diagnostic Tests:  CTH 1/5 > neg for acute infarct or hemorrhage. CTAH 1/5 > no LVO. cEEG 1/5 >  MRI brain 1/5 >   Micro Data:  COVID 1/5 > neg. Flu 1/5 > neg. Blood 1/5 >  Sputum 1/5 >   Antimicrobials:  Vanc1/5 >   Ceftriaxone 1/5 > Ampicillin 1/5 >  Acyclovir 1/5 >   Interim history/subjective:  Started on empiric abx for meningitis. MRI brain still pending. Agitated on fentanyl gtt alone  Objective:  Blood pressure 104/63, pulse 66, temperature 98.8 F  (37.1 C), resp. rate 18, height 5\' 1"  (1.549 m), weight 39.8 kg, SpO2 97 %.    Vent Mode: PRVC FiO2 (%):  [50 %-100 %] 60 % Set Rate:  [18 bmp] 18 bmp Vt Set:  [380 mL] 380 mL PEEP:  [5 cmH20-8 cmH20] 8 cmH20 Plateau Pressure:  [16 cmH20-24 cmH20] 24 cmH20   Intake/Output Summary (Last 24 hours) at 11/26/2020 0949 Last data filed at 11/26/2020 0254 Gross per 24 hour  Intake 1566.79 ml  Output 400 ml  Net 1166.79 ml   Filed Weights   11/25/20 1500 11/25/20 1521  Weight: 40 kg 39.8 kg    Examination: General: Adult female, elderly and frail, in NAD. Neuro: Sedated, not responsive. HEENT: [place]/AT. Sclerae anicteric.  ETT in place. EEG leads in place Cardiovascular: RRR, no M/R/G.  Lungs: Respirations even and unlabored.  Coarse bases. Abdomen: BS x 4, soft, NT/ND.  Musculoskeletal: thin extremities, no edema Skin: Intact, warm, no rashes.  Assessment & Plan:   Acute metabolic encephalopathy Hx prior CVA 2018 - presumed 2/2 new onset first time seizures with post ictal state.   - cEEG per neuro - so far no seizures noted on overnight. Awaiting continuous EEG read. - needs MRI brain today.if negative will need to do LP. - empiric meningitis abx now.  - AED's per neuro. - Check UA, blood and sputum cultures. - Continue home ASA.  Acute hypoxemic respiratory failure - secondary to clinical coma - Full vent support. - Assess ABG. - Wean as able. - Daily SBT. - Bronchial hygiene. - Follow CXR.  Aspiration event- 2/2 above. - Empiric abx as above. abx for meningitis  will cover pna for now.  - Follow cultures.  Brief cardiac arrest - prior to intubation there was loss of pulses with apnea.  Pulses noted with sinus tach after 1 round CPR. - Supportive care. - Trend trops. - echo pending  Hx HTN, HLD. - Continue home ASA, Atorvastatin. - Hold home Amlodipine, Furosemide, Losartan.  Hypocalcemia. - 1g Ca gluconate.  Hyperglycemia. - likely stress induced - A1C  5.5 - SSI as needed  Best Practice (evaluated daily):  Diet: NPO. May need TF starting.  Pain/Anxiety/Delirium protocol (if indicated): Fentanyl gtt / Midazolam PRN.  RASS goal -1. VAP protocol (if indicated): In place. DVT prophylaxis: SCD's / Heparin. GI prophylaxis: PPI. Glucose control: SSI. Mobility: Bedrest. Disposition: ICU.  Goals of Care:  Last date of multidisciplinary goals of care discussion: None.   Husband updated at bedside in ICU 1/6 Family and staff present: None. Summary of discussion: None. Follow up goals of care discussion due: 12/01/20. Code Status:  Full.  Labs   CBC: Recent Labs  Lab 11/25/20 1439 11/25/20 1451 11/26/20 0444 11/26/20 0507  WBC 8.8  --   --  16.0*  NEUTROABS 6.1  --   --   --   HGB 13.9 14.3 11.2* 12.8  HCT 42.3 42.0 33.0* 37.9  MCV 92.2  --   --  92.9  PLT 277  --   --  99991111   Basic Metabolic Panel: Recent Labs  Lab 11/25/20 1439 11/25/20 1451 11/26/20 0444 11/26/20 0507  NA 137 138 138 139  K 3.6 3.5 3.4* 3.8  CL 100 99  --  105  CO2 24  --   --  20*  GLUCOSE 172* 170*  --  124*  BUN 16 18  --  16  CREATININE 0.89 0.70  --  0.78  CALCIUM 9.5  --   --  8.4*  MG  --   --   --  1.6*  PHOS  --   --   --  3.5   GFR: Estimated Creatinine Clearance: 36.4 mL/min (by C-G formula based on SCr of 0.78 mg/dL). Recent Labs  Lab 11/25/20 1439 11/26/20 0507  WBC 8.8 16.0*   Liver Function Tests: Recent Labs  Lab 11/25/20 1439  AST 22  ALT 15  ALKPHOS 63  BILITOT 1.8*  PROT 6.8  ALBUMIN 3.8   No results for input(s): LIPASE, AMYLASE in the last 168 hours. No results for input(s): AMMONIA in the last 168 hours. ABG    Component Value Date/Time   PHART 7.334 (L) 11/26/2020 0444   PCO2ART 43.6 11/26/2020 0444   PO2ART 89 11/26/2020 0444   HCO3 23.5 11/26/2020 0444   TCO2 25 11/26/2020 0444   ACIDBASEDEF 3.0 (H) 11/26/2020 0444   O2SAT 97.0 11/26/2020 0444    Coagulation Profile: Recent Labs  Lab 11/25/20 1439   INR 1.0   Cardiac Enzymes: No results for input(s): CKTOTAL, CKMB, CKMBINDEX, TROPONINI in the last 168 hours. HbA1C: Hgb A1c MFr Bld  Date/Time Value Ref Range Status  11/25/2020 08:10 PM 5.5 4.8 - 5.6 % Final    Comment:    (NOTE) Pre diabetes:          5.7%-6.4%  Diabetes:              >6.4%  Glycemic control for   <7.0% adults with diabetes   08/24/2015 03:36 PM 6.0 4.6 - 6.5 % Final    Comment:    Glycemic Control Guidelines for People with  Diabetes:Non Diabetic:  <6%Goal of Therapy: <7%Additional Action Suggested:  >8%    CBG: Recent Labs  Lab 11/25/20 1500 11/25/20 1939 11/26/20 0128 11/26/20 0505 11/26/20 0802  GLUCAP 206* 151* 167* 126* 156*    Review of Systems:   Unable to obtain as pt is encephalopathic.  Past medical history  She,  has a past medical history of Allergy, Asthma, At high risk for falls, Ataxia, Cataract, Diverticulosis, Emphysema, Gait abnormality (05/14/2013), GERD (gastroesophageal reflux disease), Hepatic steatosis, Hyperlipidemia, Hypertension, Iron deficiency anemia, unspecified, Kidney cysts (11/16/12), Nystagmus, end-position, Osteopenia, Renal cyst, and Stroke ([place]) (01/2017).   Surgical History    Past Surgical History:  Procedure Laterality Date   ANAL RECTAL MANOMETRY N/A 12/20/2017   Procedure: ANO RECTAL MANOMETRY;  Surgeon: Mauri Pole, MD;  Location: [facility] ENDOSCOPY;  Service: Endoscopy;  Laterality: N/A;   BREAST BIOPSY Bilateral 1970's   CATARACT EXTRACTION Bilateral LT:02/14/13,RT:02/21/13   CERVICAL SPINE SURGERY  2010   COLONOSCOPY  03-12-14   per Dr. Olevia Perches, diverticulosis only, repeat 5 yrs    EYE SURGERY Bilateral 01/2013&02/2013   left then right   HERNIA REPAIR  Oct '14-left, remote-right   years ago right; left done '14   SKIN CANCER EXCISION  05/13/13   FACE, basal cell   TONSILLECTOMY       Social History   reports that she has never smoked. She has never used smokeless tobacco. She reports that she does  not drink alcohol and does not use drugs.   Family history   Her family history includes Breast cancer in her paternal aunt; Cancer - Lung in her paternal aunt; Colon cancer in her father, paternal aunt, paternal aunt, and paternal grandmother; Coronary artery disease in her father; Esophageal cancer in an other family member; Heart attack in her paternal grandfather; Heart block in her father; Ovarian cancer in her maternal aunt; Pancreatic cancer in her mother; Throat cancer in her mother. There is no history of Colon polyps, Rectal cancer, or Stomach cancer.   Allergies Allergies  Allergen Reactions   Contrast Media [Iodinated Diagnostic Agents] Other (See Comments)    Congestion .Marland KitchenMarland Kitchenpatient stated it irritated her eyes and voice   Iohexol Other (See Comments)    Gi upset   Lisinopril Cough     Home meds  Prior to Admission medications   Medication Sig Start Date End Date Taking? Authorizing Provider  amLODipine (NORVASC) 10 MG tablet Take 1 tablet (10 mg total) by mouth daily. 10/08/18   [name], MD  aspirin 325 MG tablet Take 325 mg by mouth daily.    [provider]  aspirin 81 MG chewable tablet Chew by mouth daily.    [provider]  atorvastatin (LIPITOR) 10 MG tablet Take 1 tablet (10 mg total) by mouth daily. 10/08/18 10/08/19  [name], MD  azelastine (ASTELIN) 0.1 % nasal spray azelastine 137 mcg (0.1 %) nasal spray aerosol    [provider]  CALCIUM-MAGNESIUM-ZINC PO Take 1 tablet by mouth 3 (three) times daily.    [provider]  Cholecalciferol (VITAMIN D PO) Take 1 capsule by mouth daily.    [provider]  DHA-EPA-Vit B6-B12-Folic Acid (CARDIOVID PLUS) CAPS Take 1 capsule by mouth daily.    [provider]  EPINEPHrine 0.3 mg/0.3 mL IJ SOAJ injection SMARTSIG:0.3 Milligram(s) IM Once PRN 08/21/20   [provider]  ferrous sulfate 325 (65 FE) MG tablet Take 1 tablet (325 mg total) by mouth 2  (  two) times daily with a meal. 10/12/18   Nelwyn Salisbury, MD  fexofenadine (ALLEGRA) 180 MG tablet Take 180 mg by mouth daily. Mostly in the Fall    [provider]  furosemide (LASIX) 20 MG tablet Take 1 tablet (20 mg total) by mouth daily. 10/08/18   Nelwyn Salisbury, MD  Glucosamine Sulfate 1000 MG TABS Take 1 tablet by mouth daily.     [provider]  ibuprofen (ADVIL) 800 MG tablet Take 800 mg by mouth 3 (three) times daily as needed. 05/19/20   [provider]  ketoconazole (NIZORAL) 2 % cream Apply 1 application topically 2 (two) times daily as needed for irritation. 07/14/20   Nelwyn Salisbury, MD  losartan (COZAAR) 50 MG tablet Take 1 tablet (50 mg total) by mouth daily. 10/08/18   Nelwyn Salisbury, MD  Lysine 500 MG TABS Take 1 tablet by mouth daily.     [provider]  Multiple Vitamins-Minerals (CENTRUM SILVER PO) Take 1 capsule by mouth daily.      [provider]  nitrofurantoin, macrocrystal-monohydrate, (MACROBID) 100 MG capsule Take 1 capsule (100 mg total) by mouth 2 (two) times daily. 08/20/19   Nelwyn Salisbury, MD  Nutritional Supplements (ADRENAL COMPLEX PO) Take 1 capsule by mouth daily.    [provider]  omeprazole (PRILOSEC) 20 MG capsule Take 1 capsule (20 mg total) by mouth daily. 10/08/18   Nelwyn Salisbury, MD  predniSONE (DELTASONE) 20 MG tablet Take 60 mg by mouth daily with breakfast.    [provider]  urea (CARMOL) 40 % CREA Apply twice daily to rough / callused areas of feet 08/21/20   Edwin Cap, DPM    The patient is critically ill with multiple organ systems failure and requires high complexity decision making for assessment and support, frequent evaluation and titration of therapies, application of advanced monitoring technologies and extensive interpretation of multiple databases.   Critical Care Time devoted to patient care services described in this note is 37 minutes. This time reflects time of  care of this signee Charlott Holler . This critical care time does not reflect separately billable procedures or procedure time, teaching time or supervisory time of PA/NP/Med student/Med Resident etc but could involve care discussion time.  Mickel Baas Pulmonary and Critical Care Medicine 11/26/2020 9:49 AM  Pager: see amion After hours pager: [phone]

## 2020-11-26 NOTE — Procedures (Addendum)
Patient Name: Kristine Francis  MRN: [id number]  Epilepsy Attending: Charlsie Quest  Referring Physician/Provider: Dr. Erick Blinks Date: 11/25/2020  Duration: 24.22 minutes  Patient history: 80 year old female with new onset seizures.  EEG to evaluate for seizures.  Level of alertness: comatose  AEDs during EEG study: Keppra  Technical aspects: This EEG study was done with scalp electrodes positioned according to the 10-20 International system of electrode placement. Electrical activity was acquired at a sampling rate of 500Hz  and reviewed with a high frequency filter of 70Hz  and a low frequency filter of 1Hz . EEG data were recorded continuously and digitally stored.   Description: EEG showed continuous generalized 3 to 6 Hz theta-delta slowing admixed with 15 to 18 Hz generalized beta activity.  Hyperventilation and photic stimulation were not performed.     ABNORMALITY -Continuous slow, generalized  IMPRESSION: This study is suggestive of moderate to severe diffuse encephalopathy, nonspecific etiology.  No seizures or epileptiform discharges were seen throughout the recording.  Kristine Francis 

## 2020-11-26 NOTE — Progress Notes (Signed)
Patient transported to MRI and back without any complication

## 2020-11-26 NOTE — Progress Notes (Signed)
vLTM EEG is reconnected after being transferred to the ICU. Pt is currently being monitor by [facility].

## 2020-11-26 NOTE — Progress Notes (Signed)
Pt was bolused three times with Fentanyl 25 mcg, 50 mcg and 25 mcg during MRI. Pt sustained SBPs in 160-100s during that time. Pt transferred back to the unit without any complications.

## 2020-11-26 NOTE — Progress Notes (Signed)
Moving pt to unit RN aware to call me when pt arrives so that I can connect back to EEG

## 2020-11-26 NOTE — Progress Notes (Signed)
eLink Physician-Brief Progress Note Patient Name: JAILYN LEESON DOB: 1941/11/07 MRN: [id number]   Date of Service  11/26/2020  HPI/Events of Note  Nursing request for AM lab orders.   eICU Interventions  Will order CBC with platelets, BMP and Mg++ level at 5 AM/     Intervention Category Major Interventions: Electrolyte abnormality - evaluation and management  Waseem Suess [place] 11/26/2020, 10:11 PM

## 2020-11-26 NOTE — Progress Notes (Signed)
Subjective: No further seizures overnight.  ROS: Unable to obtain due to poor mental status  Examination  Vital signs in last 24 hours: Temp:  [95.9 F (35.5 C)-98.8 F (37.1 C)] 98.8 F (37.1 C) (01/06 0930) Pulse Rate:  [52-139] 80 (01/06 1045) Resp:  [9-23] 18 (01/06 0930) BP: (62-218)/(47-130) 104/63 (01/06 0930) SpO2:  [81 %-100 %] 93 % (01/06 1045) FiO2 (%):  [50 %-100 %] 80 % (01/06 1045) Weight:  [39.8 kg-40 kg] 39.8 kg (01/05 1521)  General: lying in bed, NAD CVS: pulse-normal rate and rhythm RS: breathing comfortably, intubated Extremities: normal, warm Neuro: Comatose, opens eyes to noxious relation, does not follow commands, PERRLA, corneal reflex intact, gag is intact, withdraws to noxious stimulation in all 4 extremities  Basic Metabolic Panel: Recent Labs  Lab 11/25/20 1439 11/25/20 1451 11/26/20 0444 11/26/20 0507 11/26/20 1041  NA 137 138 138 139 139  K 3.6 3.5 3.4* 3.8 3.3*  CL 100 99  --  105  --   CO2 24  --   --  20*  --   GLUCOSE 172* 170*  --  124*  --   BUN 16 18  --  16  --   CREATININE 0.89 0.70  --  0.78  --   CALCIUM 9.5  --   --  8.4*  --   MG  --   --   --  1.6*  --   PHOS  --   --   --  3.5  --     CBC: Recent Labs  Lab 11/25/20 1439 11/25/20 1451 11/26/20 0444 11/26/20 0507 11/26/20 1041  WBC 8.8  --   --  16.0*  --   NEUTROABS 6.1  --   --   --   --   HGB 13.9 14.3 11.2* 12.8 11.6*  HCT 42.3 42.0 33.0* 37.9 34.0*  MCV 92.2  --   --  92.9  --   PLT 277  --   --  263  --      Coagulation Studies: Recent Labs    11/25/20 1439  LABPROT 13.2  INR 1.0    Imaging CT head without contrast 11/25/2020: No acute abnormality CT head and neck 11/25/2020: No large vessel occlusion or proximal hemodynamically significant stenosis intracranially or in the neck.   ASSESSMENT AND PLAN: 80 year old female who presented with new onset seizure.  She briefly lost pulses and required chest compressions, ROSC was achieved in about 2  minutes.  New onset seizure Acute encephalopathy suspected due to seizure versus infection -Etiology for new onset seizure: Suspected infection versus hypertensive emergency/PRES  Recommendations -Discontinue LTM EEG as patient has not had any seizures.  Continue Keppra 500 mg twice daily. - MRI brain with and without contrast pending - Patient has been afebrile.  Already on empiric antibiotics and acyclovir for meningitis coverage.  Will decide if patient needs a lumbar puncture depending on MRI findings. -Continue seizure precautions - As needed IV Ativan 2 mg for clinical seizure-like activity -Management of rest of comorbidities per primary team -Updated husband at bedside  CRITICAL CARE Performed by: [name]   Total critical care time: 35 minutes  Critical care time was exclusive of separately billable procedures and treating other patients.  Critical care was necessary to treat or prevent imminent or life-threatening deterioration.  Critical care was time spent personally by me on the following activities: development of treatment plan with patient and/or surrogate as well as nursing, discussions with  consultants, evaluation of patient's response to treatment, examination of patient, obtaining history from patient or surrogate, ordering and performing treatments and interventions, ordering and review of laboratory studies, ordering and review of radiographic studies, pulse oximetry and re-evaluation of patient's condition.    Lindie Spruce Epilepsy [facility] For questions after 5pm please refer to AMION to reach the Neurologist on call

## 2020-11-27 ENCOUNTER — Inpatient Hospital Stay (HOSPITAL_COMMUNITY): Payer: Medicare Other

## 2020-11-27 DIAGNOSIS — I469 Cardiac arrest, cause unspecified: Secondary | ICD-10-CM

## 2020-11-27 DIAGNOSIS — G9341 Metabolic encephalopathy: Secondary | ICD-10-CM | POA: Diagnosis not present

## 2020-11-27 DIAGNOSIS — R569 Unspecified convulsions: Secondary | ICD-10-CM | POA: Diagnosis not present

## 2020-11-27 DIAGNOSIS — J9601 Acute respiratory failure with hypoxia: Secondary | ICD-10-CM | POA: Diagnosis not present

## 2020-11-27 LAB — CBC WITH DIFFERENTIAL/PLATELET
Abs Immature Granulocytes: 0.07 10*3/uL (ref 0.00–0.07)
Basophils Absolute: 0 10*3/uL (ref 0.0–0.1)
Basophils Relative: 0 %
Eosinophils Absolute: 0 10*3/uL (ref 0.0–0.5)
Eosinophils Relative: 0 %
HCT: 34.6 % — ABNORMAL LOW (ref 36.0–46.0)
Hemoglobin: 11.2 g/dL — ABNORMAL LOW (ref 12.0–15.0)
Immature Granulocytes: 0 %
Lymphocytes Relative: 5 %
Lymphs Abs: 0.8 10*3/uL (ref 0.7–4.0)
MCH: 29.9 pg (ref 26.0–34.0)
MCHC: 32.4 g/dL (ref 30.0–36.0)
MCV: 92.3 fL (ref 80.0–100.0)
Monocytes Absolute: 1 10*3/uL (ref 0.1–1.0)
Monocytes Relative: 6 %
Neutro Abs: 14.1 10*3/uL — ABNORMAL HIGH (ref 1.7–7.7)
Neutrophils Relative %: 89 %
Platelets: 219 10*3/uL (ref 150–400)
RBC: 3.75 MIL/uL — ABNORMAL LOW (ref 3.87–5.11)
RDW: 13.7 % (ref 11.5–15.5)
WBC: 15.9 10*3/uL — ABNORMAL HIGH (ref 4.0–10.5)
nRBC: 0 % (ref 0.0–0.2)

## 2020-11-27 LAB — BASIC METABOLIC PANEL
Anion gap: 11 (ref 5–15)
BUN: 16 mg/dL (ref 8–23)
CO2: 20 mmol/L — ABNORMAL LOW (ref 22–32)
Calcium: 8 mg/dL — ABNORMAL LOW (ref 8.9–10.3)
Chloride: 105 mmol/L (ref 98–111)
Creatinine, Ser: 1.01 mg/dL — ABNORMAL HIGH (ref 0.44–1.00)
GFR, Estimated: 57 mL/min — ABNORMAL LOW (ref >60)
Glucose, Bld: 130 mg/dL — ABNORMAL HIGH (ref 70–99)
Potassium: 3 mmol/L — ABNORMAL LOW (ref 3.5–5.1)
Sodium: 136 mmol/L (ref 135–145)

## 2020-11-27 LAB — ECHOCARDIOGRAM COMPLETE
Area-P 1/2: 3.08 cm2
Calc EF: 66.9 %
Height: 61 in
S' Lateral: 2.4 cm
Single Plane A2C EF: 70.2 %
Single Plane A4C EF: 70.8 %
Weight: 1626.11 oz

## 2020-11-27 LAB — GLUCOSE, CAPILLARY
Glucose-Capillary: 107 mg/dL — ABNORMAL HIGH (ref 70–99)
Glucose-Capillary: 109 mg/dL — ABNORMAL HIGH (ref 70–99)
Glucose-Capillary: 114 mg/dL — ABNORMAL HIGH (ref 70–99)
Glucose-Capillary: 116 mg/dL — ABNORMAL HIGH (ref 70–99)
Glucose-Capillary: 129 mg/dL — ABNORMAL HIGH (ref 70–99)
Glucose-Capillary: 147 mg/dL — ABNORMAL HIGH (ref 70–99)
Glucose-Capillary: 77 mg/dL (ref 70–99)

## 2020-11-27 LAB — MAGNESIUM: Magnesium: 2.1 mg/dL (ref 1.7–2.4)

## 2020-11-27 MED ORDER — POTASSIUM CHLORIDE 10 MEQ/100ML IV SOLN
10.0000 meq | INTRAVENOUS | Status: DC
Start: 2020-11-27 — End: 2020-11-27

## 2020-11-27 MED ORDER — FENTANYL CITRATE (PF) 100 MCG/2ML IJ SOLN
25.0000 ug | INTRAMUSCULAR | Status: DC | PRN
  Administered 2020-11-27 – 2020-11-28 (×4): 50 ug via INTRAVENOUS
  Filled 2020-11-27 (×4): qty 2

## 2020-11-27 MED ORDER — ADULT MULTIVITAMIN W/MINERALS CH
1.0000 | ORAL_TABLET | Freq: Every day | ORAL | Status: DC
  Administered 2020-11-27 – 2020-12-05 (×8): 1
  Filled 2020-11-27 (×8): qty 1

## 2020-11-27 MED ORDER — VITAL AF 1.2 CAL PO LIQD
1000.0000 mL | ORAL | Status: DC
  Administered 2020-11-27: 1000 mL

## 2020-11-27 MED ORDER — POTASSIUM CHLORIDE 10 MEQ/100ML IV SOLN
10.0000 meq | INTRAVENOUS | Status: AC
  Administered 2020-11-27 (×3): 10 meq via INTRAVENOUS
  Filled 2020-11-27 (×3): qty 100

## 2020-11-27 MED ORDER — DEXMEDETOMIDINE HCL IN NACL 400 MCG/100ML IV SOLN
0.2000 ug/kg/h | INTRAVENOUS | Status: DC
  Administered 2020-11-27: 0.4 ug/kg/h via INTRAVENOUS
  Administered 2020-11-27: 1.2 ug/kg/h via INTRAVENOUS
  Administered 2020-11-28: 0.6 ug/kg/h via INTRAVENOUS
  Administered 2020-11-29: 1.2 ug/kg/h via INTRAVENOUS
  Administered 2020-11-29 (×2): 0.8 ug/kg/h via INTRAVENOUS
  Filled 2020-11-27 (×6): qty 100

## 2020-11-27 MED ORDER — POTASSIUM CHLORIDE 20 MEQ PO PACK
40.0000 meq | PACK | Freq: Two times a day (BID) | ORAL | Status: DC
  Administered 2020-11-27: 40 meq
  Filled 2020-11-27 (×2): qty 2

## 2020-11-27 MED ORDER — POTASSIUM CHLORIDE 20 MEQ PO PACK
40.0000 meq | PACK | Freq: Two times a day (BID) | ORAL | Status: DC
  Administered 2020-11-27: 40 meq via ORAL
  Filled 2020-11-27: qty 2

## 2020-11-27 MED ORDER — PROSOURCE TF PO LIQD: 45.0000 mL | Freq: Two times a day (BID) | ORAL | Status: DC

## 2020-11-27 NOTE — Progress Notes (Signed)
  Echocardiogram 2D Echocardiogram has been performed.  Kristine Francis 11/27/2020, 8:38 AM

## 2020-11-27 NOTE — Progress Notes (Signed)
Subjective: No acute events overnight.  ROS: Unable to obtain due to poor mental status  Examination  Vital signs in last 24 hours: Temp:  [97.16 F (36.2 C)-100.22 F (37.9 C)] 97.88 F (36.6 C) (01/07 0930) Pulse Rate:  [64-127] 75 (01/07 0930) Resp:  [14-28] 18 (01/07 0930) BP: (52-180)/(36-109) 126/69 (01/07 0930) SpO2:  [92 %-100 %] 99 % (01/07 0930) FiO2 (%):  [40 %-80 %] 40 % (01/07 0810) Weight:  [46.1 kg] 46.1 kg (01/07 0215)  General: lying in bed, NAD CVS: pulse-normal rate and rhythm RS: breathing comfortably, intubated Extremities: normal, warm Neuro:  Awake, does not look at the examiner, does not follow commands, PERRLA, extraocular movements appear intact, does not blink to threat, possible right-sided neglect versus left gaze preference, unable to assess patient secondary to intubation, moving all 4 extremities.  Basic Metabolic Panel: Recent Labs  Lab 11/25/20 1439 11/25/20 1451 11/26/20 0444 11/26/20 0507 11/26/20 1041 11/27/20 0614  NA 137 138 138 139 139 136  K 3.6 3.5 3.4* 3.8 3.3* 3.0*  CL 100 99  --  105  --  105  CO2 24  --   --  20*  --  20*  GLUCOSE 172* 170*  --  124*  --  130*  BUN 16 18  --  16  --  16  CREATININE 0.89 0.70  --  0.78  --  1.01*  CALCIUM 9.5  --   --  8.4*  --  8.0*  MG  --   --   --  1.6*  --  2.1  PHOS  --   --   --  3.5  --   --     CBC: Recent Labs  Lab 11/25/20 1439 11/25/20 1451 11/26/20 0444 11/26/20 0507 11/26/20 1041 11/27/20 0614  WBC 8.8  --   --  16.0*  --  15.9*  NEUTROABS 6.1  --   --   --   --  14.1*  HGB 13.9 14.3 11.2* 12.8 11.6* 11.2*  HCT 42.3 42.0 33.0* 37.9 34.0* 34.6*  MCV 92.2  --   --  92.9  --  92.3  PLT 277  --   --  263  --  219     Coagulation Studies: Recent Labs    11/25/20 1439  LABPROT 13.2  INR 1.0    Imaging MRI brain with and without contrast 11/26/2020: 1. Multiple foci of susceptibility artifact in the bilateral cerebral hemispheres, more pronounced in the left  occipital region where there is associated subtle T2 hyperintensity of the subcortical white matter and some of the foci appear to be cortical. Findings may represent chronic microhemorrhages related to hypertension or amyloid angiopathy. 2. Remote lacunar infarct in the right basal ganglia region. 3. Scattered foci of T2 hyperintensity within the white matter of the cerebral hemispheres and pons, nonspecific, most likely related to chronic small vessel ischemia.     ASSESSMENT AND PLAN: 80 year old female who presented with new onset seizure.  She briefly lost pulses and required chest compressions, ROSC was achieved in about 2 minutes.  New onset seizure Acute encephalopathy suspected due to seizure versus infection -Etiology for new onset seizure: Unclear etiology of new onset seizures.    Recommendations - Patient has been afebrile.  Already on empiric antibiotics and acyclovir for meningitis coverage.   No enhancement on MRI.  Therefore will hold off on lumbar puncture at this point. -However if patient continues to have left gaze preference and not  follow commands, will consider lumbar puncture and repeat EEG -Continue seizure precautions - As needed IV Ativan 2 mg for clinical seizure-like activity -Management of rest of comorbidities per primary team -Updated husband at bedside  CRITICAL CARE Performed by: [name]   Total critical care time: 35 minutes  Critical care time was exclusive of separately billable procedures and treating other patients.  Critical care was necessary to treat or prevent imminent or life-threatening deterioration.  Critical care was time spent personally by me on the following activities: development of treatment plan with patient and/or surrogate as well as nursing, discussions with consultants, evaluation of patient's response to treatment, examination of patient, obtaining history from patient or surrogate, ordering and performing  treatments and interventions, ordering and review of laboratory studies, ordering and review of radiographic studies, pulse oximetry and re-evaluation of patient's condition.    Zeb Comfort Epilepsy [facility] For questions after 5pm please refer to AMION to reach the Neurologist on call

## 2020-11-27 NOTE — Progress Notes (Signed)
Initial Nutrition Assessment  DOCUMENTATION CODES:   Non-severe (moderate) malnutrition in context of chronic illness  INTERVENTION:   Initiate TF via OG tube:  -Vital AF 1.2 at 20 mL/hr and advance by 10 mL q 8 hours until goal of 40 mL/hr is reached.   -This regimen provides 1152 kcals, 72 grams protein, and 777 mL of free water.   MVI with minerals daily through OG tube.    NUTRITION DIAGNOSIS:   Moderate Malnutrition related to chronic illness (asthma and emphysema) as evidenced by moderate muscle depletion,mild fat depletion,mild muscle depletion.  GOAL:   Patient will meet greater than or equal to 90% of their needs  MONITOR:   Labs,Vent status,Weight trends,TF tolerance  REASON FOR ASSESSMENT:   Consult Enteral/tube feeding initiation and management  ASSESSMENT:   80 y.o. female who has PMH of CVA in 2018, HTN, HLD, iron-deficiency anemia, asthma, emphysema, generalized gait abnormality d/t balance problem. Presented to [facility] ED with AMS and probably seizures complicated by aspiration PNA. She had roughly 2 minutes of CPR prior to intubation.  Discussed pt in ICU rounds. Pt D/C'd on sedation medications. OG tube in pace. MD verbalized consent to begin tube feeds via OG in ICU rounds.   Per chart review, pt weight appears stable over the last 3 years with fluctuations between 37-43 kg. Weight upon admission: 40 kg. Last weight: 46.1 kg. Most current weight is likely increased d/t fluid accumulation.   Labs reviewed: CBG's x 24 hours: 109-156, Potassium 3.0   Medications reviewed and include: Novolog SSI, Protonix, KCl, NS at 10 mL/hr, Acyclovir in D5, Precedex at 4.6 mL/hr, Levophed at 3.75 mL/hr   NUTRITION - FOCUSED PHYSICAL EXAM:  Flowsheet Row Most Recent Value  Orbital Region Mild depletion  Upper Arm Region Mild depletion  Thoracic and Lumbar Region Unable to assess  Buccal Region Unable to assess  Temple Region Moderate depletion  Clavicle Bone Region  Moderate depletion  Clavicle and Acromion Bone Region Moderate depletion  Scapular Bone Region Unable to assess  Dorsal Hand Unable to assess  [mitts]  Patellar Region Mild depletion  Anterior Thigh Region Mild depletion  Posterior Calf Region Unable to assess  Edema (RD Assessment) Mild  Hair Reviewed  Eyes Unable to assess  Mouth Unable to assess  Skin Reviewed  [overall pale]  Nails Unable to assess       Diet Order:   Diet Order            Diet NPO time specified  Diet effective now                 EDUCATION NEEDS:   Not appropriate for education at this time  Skin:  Skin Assessment: Reviewed RN Assessment  Last BM:  11/26/2020  Height:   Ht Readings from Last 1 Encounters:  11/25/20 5\' 1"  (1.549 m)    Weight:   Wt Readings from Last 1 Encounters:  11/27/20 46.1 kg    Ideal Body Weight:  47.7 kg  BMI:  Body mass index is 19.2 kg/m.  Estimated Nutritional Needs:   Kcal:  1150 kcals  Protein:  65-75 grams  Fluid:  >1.2 L/day    Ronnald Nian, Dietetic Intern Pager: [phone] If unavailable: [phone]

## 2020-11-27 NOTE — Progress Notes (Signed)
eLink Physician-Brief Progress Note Patient Name: Kristine Francis DOB: 1941/02/07 MRN: [id number]   Date of Service  11/27/2020  HPI/Events of Note  RN requesting a Fentanyl IV push prn order, the only order he has is bolus from infusion, and infusion was discontinued.  eICU Interventions  Plan: 1. D/C bolus Fentanyl from infusion order.  2. Fentanyl 25-50 mcg IV ! 1 hour PRN agitation or sedation.      Intervention Category Major Interventions: Delirium, psychosis, severe agitation - evaluation and management  Elly Haffey [place] 11/27/2020, 8:00 PM

## 2020-11-27 NOTE — Progress Notes (Addendum)
NAME:  Kristine Francis, MRN:  [id number], DOB:  August 09, 1941, LOS: 2 ADMISSION DATE:  11/25/2020, CONSULTATION DATE:  11/25/20 REFERRING MD:  Steffanie Dunn  CHIEF COMPLAINT:  AMS   Brief History   Kristine Francis is a 80 y.o. female who was admitted 1/5 with AMS and probable seizures complicated by aspiration PNA.  She required intubation in ED.Of note, she had roughly 2 minutes CPR prior to intubation for pulselessness and apnea.  Pulses noted after 1 round, sinus tach.  CTA and Head CT were negative for infarct. Started on continuous EEG by neurology and PCCM admitted. No prior history of seizures.   Past Medical History  has ANEMIA-IRON DEFICIENCY; EMPHYSEMA; Asthma; GERD; DIVERTICULOSIS OF COLON; RENAL CYST; CALLUS, TOE; hip pain; CERVICAL RADICULOPATHY, RIGHT; OSTEOPENIA; ATAXIA; LEG CRAMPS, NOCTURNAL; Routine health maintenance; Gait disorder; Nystagmus, end-position; Gait abnormality; Left inguinal hernia; Chest pain; Dysuria; Acute cystitis without hematuria; Hyperlipidemia; Essential hypertension; Visual disturbances; Incontinence of feces; Falls infrequently; Groin mass; Luetscher's syndrome; Cerebellar ataxia ([place]); Sensory neuropathy; Sensory polyneuropathy; Seizures ([place]); Acute encephalopathy; Acute respiratory failure with hypoxia ([place]); Aspiration into airway; and Cardiac arrest ([place]) on their problem list.  Significant Hospital Events   1/5 > Admit 1/5 > PEA arrest, intubation  Consults:  Neuro, PCCM  Procedures:  ETT 1/5 >   Significant Diagnostic Tests:  CTH 1/5 > neg for acute infarct or hemorrhage. CTAH 1/5 > no LVO. cEEG 1/5 > diffuse encephalopathy, no seizures MRI brain 1/5 > meg  Micro Data:  COVID 1/5 > neg. Flu 1/5 > neg. Blood 1/5 > neg Sputum 1/7 >   Antimicrobials:  Vanc1/5 >   Ceftriaxone 1/5 > Ampicillin 1/5 >  Acyclovir 1/5 >   Interim history/subjective:  No acute events overnight. Intermittently agitated, trying to get out of bed requiring increased  sedation.  Objective:  Blood pressure 126/69, pulse 75, temperature 97.88 F (36.6 C), resp. rate 18, height 5\' 1"  (1.549 m), weight 46.1 kg, SpO2 99 %.    Vent Mode: PRVC FiO2 (%):  [40 %-80 %] 40 % Set Rate:  [18 bmp] 18 bmp Vt Set:  [380 mL] 380 mL PEEP:  [8 cmH20-10 cmH20] 8 cmH20 Plateau Pressure:  [27 cmH20] 27 cmH20   Intake/Output Summary (Last 24 hours) at 11/27/2020 1009 Last data filed at 11/27/2020 0900 Gross per 24 hour  Intake 4570.92 ml  Output 1085 ml  Net 3485.92 ml   Filed Weights   11/25/20 1521 11/26/20 2000 11/27/20 0215  Weight: 39.8 kg 46.1 kg 46.1 kg    Examination: General: Chronically ill-appearing, elderly, no acute distress Neuro: Opens eyes to sound, touch, follows commands w/ delay HEENT: Normocephalic, atraumatic. Pupils equal, reactive. Cardiovascular: Regular rate, rhythm. No murmurs, rubs, gallops. Lungs: Coarse rhonchi bilaterally. No wheezing, rales appreciated. Abdomen: Soft, non-tender, nondistended Musculoskeletal: No pitting edema bilaterally. Thin muscles throughout. Skin: Intact, warm, no rashes.  Assessment & Plan:  Kristine Francis 80yo female with previous CVA, hypertension, hyperlipidemia, no known history of seizures admitted 1/5 with acute metabolic encephalopathy with GTC seizure, PEA arrest, and aspiration event occurring in ED.  #Acute metabolic encephalopathy EEG negative for further seizures, MRI negative for acute stroke. Continues to be afebrile, leukocytosis most likely 2/2 steroids given in ED. UA negative, BCx NGTD. Pending aspirate cultures. Possibly meningitis, patient reportedly around young grandchildren recently. Other possible etiologies include hypertensive emergency v other metabolic derangements. Will continue with empiric antibiotics for meningitis. Plan today to wean off sedation for SBT to assess mental  status. Will try to d/c opioid medications, can start Precedex if becomes agitated. Neurology following.  - C/w  vancomycin, ceftriaxone, ampicillin, acyclovir  - Keppra 500mg  BID per neurology - Seizure precautions - CBC, BMP daily - F/u cultures - D/c sedation medications  #Acute hypoxic respiratory failure Overnight had some double stacking per RN, will attempt to wean down sedation, spontaneous breathing trial today.  - C/w vent support, wean as able - SBT this AM - Bronchial hygiene  #Aspiration event In ED had aspiration event. Will hold on further anaerobic coverage for now given she is on broad spectrum antibiotics for possible meningitis. If becomes febrile or clinically does not improve can consider Flagyl/unasyn. Will get aspirate cx today. - C/w empiric antibiotics - F/u aspirate cx  #PEA arrest In ED patient had PEA arrest w/ ROSC after 1 round CPR. Trop 22. TTE pending today. C/w home ASA. - F/u TTE  - C/w home ASA  #Hyperglycemia CBG's appropriate after being given steroids in ED. A1c 5.5. - CBG q4h - SSI  #Hypokalemia K 3.0,  Mg normal. Repleting and will re-check in AM. - KCl per tube - IV KCl x3  #Hx hypertension Holding home medications, currently requiring vasopressor support.  Best Practice (evaluated daily):  Diet: NPO. Possible start TF today pending respiratory and mental status. Pain/Anxiety/Delirium protocol (if indicated): D/c fentanyl, propofol, versed. Can start Precedex if needed VAP protocol (if indicated): In place. DVT prophylaxis: SCD's / Heparin. GI prophylaxis: PPI Glucose control: SSI Mobility: Bedrest Disposition: ICU  Goals of Care:  Last date of multidisciplinary goals of care discussion: Husband updated 1/7 AM, GOC not discussed Family and staff present: n/a Summary of discussion: n/a Follow up goals of care discussion due: 12/01/20. Code Status: FULL  Labs   CBC: Recent Labs  Lab 11/25/20 1439 11/25/20 1451 11/26/20 0444 11/26/20 0507 11/26/20 1041 11/27/20 0614  WBC 8.8  --   --  16.0*  --  15.9*  NEUTROABS 6.1  --    --   --   --  14.1*  HGB 13.9 14.3 11.2* 12.8 11.6* 11.2*  HCT 42.3 42.0 33.0* 37.9 34.0* 34.6*  MCV 92.2  --   --  92.9  --  92.3  PLT 277  --   --  263  --  219   Basic Metabolic Panel: Recent Labs  Lab 11/25/20 1439 11/25/20 1451 11/26/20 0444 11/26/20 0507 11/26/20 1041 11/27/20 0614  NA 137 138 138 139 139 136  K 3.6 3.5 3.4* 3.8 3.3* 3.0*  CL 100 99  --  105  --  105  CO2 24  --   --  20*  --  20*  GLUCOSE 172* 170*  --  124*  --  130*  BUN 16 18  --  16  --  16  CREATININE 0.89 0.70  --  0.78  --  1.01*  CALCIUM 9.5  --   --  8.4*  --  8.0*  MG  --   --   --  1.6*  --  2.1  PHOS  --   --   --  3.5  --   --    GFR: Estimated Creatinine Clearance: 33.4 mL/min (A) (by C-G formula based on SCr of 1.01 mg/dL (H)). Recent Labs  Lab 11/25/20 1439 11/26/20 0507 11/27/20 0614  WBC 8.8 16.0* 15.9*   Liver Function Tests: Recent Labs  Lab 11/25/20 1439  AST 22  ALT 15  ALKPHOS 63  BILITOT 1.8*  PROT 6.8  ALBUMIN 3.8   No results for input(s): LIPASE, AMYLASE in the last 168 hours. No results for input(s): AMMONIA in the last 168 hours. ABG    Component Value Date/Time   PHART 7.330 (L) 11/26/2020 1041   PCO2ART 45.3 11/26/2020 1041   PO2ART 56 (L) 11/26/2020 1041   HCO3 23.9 11/26/2020 1041   TCO2 25 11/26/2020 1041   ACIDBASEDEF 2.0 11/26/2020 1041   O2SAT 86.0 11/26/2020 1041    Coagulation Profile: Recent Labs  Lab 11/25/20 1439  INR 1.0   Cardiac Enzymes: No results for input(s): CKTOTAL, CKMB, CKMBINDEX, TROPONINI in the last 168 hours. HbA1C: Hgb A1c MFr Bld  Date/Time Value Ref Range Status  11/25/2020 08:10 PM 5.5 4.8 - 5.6 % Final    Comment:    (NOTE) Pre diabetes:          5.7%-6.4%  Diabetes:              >6.4%  Glycemic control for   <7.0% adults with diabetes   08/24/2015 03:36 PM 6.0 4.6 - 6.5 % Final    Comment:    Glycemic Control Guidelines for People with Diabetes:Non Diabetic:  <6%Goal of Therapy: <7%Additional Action  Suggested:  >8%    CBG: Recent Labs  Lab 11/26/20 1554 11/26/20 1750 11/26/20 2344 11/27/20 0347 11/27/20 0756  GLUCAP 69* 109* 156* 109* 129*    Review of Systems:   Unable to obtain as pt is encephalopathic.  Past medical history  She,  has a past medical history of Allergy, Asthma, At high risk for falls, Ataxia, Cataract, Diverticulosis, Emphysema, Gait abnormality (05/14/2013), GERD (gastroesophageal reflux disease), Hepatic steatosis, Hyperlipidemia, Hypertension, Iron deficiency anemia, unspecified, Kidney cysts (11/16/12), Nystagmus, end-position, Osteopenia, Renal cyst, and Stroke ([place]) (01/2017).   Surgical History    Past Surgical History:  Procedure Laterality Date  . ANAL RECTAL MANOMETRY N/A 12/20/2017   Procedure: ANO RECTAL MANOMETRY;  Surgeon: Mauri Pole, MD;  Location: [facility] ENDOSCOPY;  Service: Endoscopy;  Laterality: N/A;  . BREAST BIOPSY Bilateral 1970's  . CATARACT EXTRACTION Bilateral LT:02/14/13,RT:02/21/13  . CERVICAL SPINE SURGERY  2010  . COLONOSCOPY  03-12-14   per Dr. Olevia Perches, diverticulosis only, repeat 5 yrs   . EYE SURGERY Bilateral 01/2013&02/2013   left then right  . HERNIA REPAIR  Oct '14-left, remote-right   years ago right; left done '14  . SKIN CANCER EXCISION  05/13/13   FACE, basal cell  . TONSILLECTOMY       Social History   reports that she has never smoked. She has never used smokeless tobacco. She reports that she does not drink alcohol and does not use drugs.   Family history   Her family history includes Breast cancer in her paternal aunt; Cancer - Lung in her paternal aunt; Colon cancer in her father, paternal aunt, paternal aunt, and paternal grandmother; Coronary artery disease in her father; Esophageal cancer in an other family member; Heart attack in her paternal grandfather; Heart block in her father; Ovarian cancer in her maternal aunt; Pancreatic cancer in her mother; Throat cancer in her mother. There is no history of  Colon polyps, Rectal cancer, or Stomach cancer.   Allergies Allergies  Allergen Reactions  . Contrast Media [Iodinated Diagnostic Agents] Other (See Comments)    Congestion .Marland KitchenMarland Kitchenpatient stated it irritated her eyes and voice  . Iohexol Other (See Comments)    Gi upset  . Lisinopril Cough     Home meds  Prior to Admission medications   Medication Sig Start Date End Date Taking? Authorizing Provider  amLODipine (NORVASC) 10 MG tablet Take 1 tablet (10 mg total) by mouth daily. 10/08/18   [name], MD  aspirin 325 MG tablet Take 325 mg by mouth daily.    [provider]  aspirin 81 MG chewable tablet Chew by mouth daily.    [provider]  atorvastatin (LIPITOR) 10 MG tablet Take 1 tablet (10 mg total) by mouth daily. 10/08/18 10/08/19  [name], MD  azelastine (ASTELIN) 0.1 % nasal spray azelastine 137 mcg (0.1 %) nasal spray aerosol    [provider]  CALCIUM-MAGNESIUM-ZINC PO Take 1 tablet by mouth 3 (three) times daily.    [provider]  Cholecalciferol (VITAMIN D PO) Take 1 capsule by mouth daily.    [provider]  DHA-EPA-Vit B6-B12-Folic Acid (CARDIOVID PLUS) CAPS Take 1 capsule by mouth daily.    [provider]  EPINEPHrine 0.3 mg/0.3 mL IJ SOAJ injection SMARTSIG:0.3 Milligram(s) IM Once PRN 08/21/20   [provider]  ferrous sulfate 325 (65 FE) MG tablet Take 1 tablet (325 mg total) by mouth 2 (two) times daily with a meal. 10/12/18   [name], MD  fexofenadine (ALLEGRA) 180 MG tablet Take 180 mg by mouth daily. Mostly in the Fall    [provider]  furosemide (LASIX) 20 MG tablet Take 1 tablet (20 mg total) by mouth daily. 10/08/18   [name], MD  Glucosamine Sulfate 1000 MG TABS Take 1 tablet by mouth daily.     [provider]  ibuprofen (ADVIL) 800 MG tablet Take 800 mg by mouth 3 (three) times daily as needed. 05/19/20   [provider]  ketoconazole  (NIZORAL) 2 % cream Apply 1 application topically 2 (two) times daily as needed for irritation. 07/14/20   [name], MD  losartan (COZAAR) 50 MG tablet Take 1 tablet (50 mg total) by mouth daily. 10/08/18   [name], MD  Lysine 500 MG TABS Take 1 tablet by mouth daily.     [provider]  Multiple Vitamins-Minerals (CENTRUM SILVER PO) Take 1 capsule by mouth daily.      [provider]  nitrofurantoin, macrocrystal-monohydrate, (MACROBID) 100 MG capsule Take 1 capsule (100 mg total) by mouth 2 (two) times daily. 08/20/19   [name], MD  Nutritional Supplements (ADRENAL COMPLEX PO) Take 1 capsule by mouth daily.    [provider]  omeprazole (PRILOSEC) 20 MG capsule Take 1 capsule (20 mg total) by mouth daily. 10/08/18   [name], MD  predniSONE (DELTASONE) 20 MG tablet Take 60 mg by mouth daily with breakfast.    [provider]  urea (CARMOL) 40 % CREA Apply twice daily to rough / callused areas of feet 08/21/20   [name], DPM    Critical Care Time: 50 minutes  Sanjuan Dame, MD Internal Medicine PGY-1 Pager: [phone]

## 2020-11-28 DIAGNOSIS — G9341 Metabolic encephalopathy: Secondary | ICD-10-CM | POA: Diagnosis not present

## 2020-11-28 DIAGNOSIS — E440 Moderate protein-calorie malnutrition: Secondary | ICD-10-CM | POA: Insufficient documentation

## 2020-11-28 DIAGNOSIS — J9601 Acute respiratory failure with hypoxia: Secondary | ICD-10-CM | POA: Diagnosis not present

## 2020-11-28 DIAGNOSIS — R569 Unspecified convulsions: Secondary | ICD-10-CM | POA: Diagnosis not present

## 2020-11-28 LAB — CBC
HCT: 36.2 % (ref 36.0–46.0)
Hemoglobin: 11.8 g/dL — ABNORMAL LOW (ref 12.0–15.0)
MCH: 30.2 pg (ref 26.0–34.0)
MCHC: 32.6 g/dL (ref 30.0–36.0)
MCV: 92.6 fL (ref 80.0–100.0)
Platelets: 187 10*3/uL (ref 150–400)
RBC: 3.91 MIL/uL (ref 3.87–5.11)
RDW: 14.2 % (ref 11.5–15.5)
WBC: 15 10*3/uL — ABNORMAL HIGH (ref 4.0–10.5)
nRBC: 0 % (ref 0.0–0.2)

## 2020-11-28 LAB — GLUCOSE, CAPILLARY
Glucose-Capillary: 142 mg/dL — ABNORMAL HIGH (ref 70–99)
Glucose-Capillary: 153 mg/dL — ABNORMAL HIGH (ref 70–99)
Glucose-Capillary: 90 mg/dL (ref 70–99)
Glucose-Capillary: 98 mg/dL (ref 70–99)
Glucose-Capillary: 150 mg/dL — ABNORMAL HIGH (ref 70–99)

## 2020-11-28 LAB — COMPREHENSIVE METABOLIC PANEL
ALT: 19 U/L (ref 0–44)
AST: 30 U/L (ref 15–41)
Albumin: 2.5 g/dL — ABNORMAL LOW (ref 3.5–5.0)
Alkaline Phosphatase: 54 U/L (ref 38–126)
Anion gap: 9 (ref 5–15)
BUN: 19 mg/dL (ref 8–23)
CO2: 21 mmol/L — ABNORMAL LOW (ref 22–32)
Calcium: 8.8 mg/dL — ABNORMAL LOW (ref 8.9–10.3)
Chloride: 108 mmol/L (ref 98–111)
Creatinine, Ser: 0.76 mg/dL (ref 0.44–1.00)
GFR, Estimated: >60 mL/min (ref >60)
Glucose, Bld: 156 mg/dL — ABNORMAL HIGH (ref 70–99)
Potassium: 5.1 mmol/L (ref 3.5–5.1)
Sodium: 138 mmol/L (ref 135–145)
Total Bilirubin: 0.9 mg/dL (ref 0.3–1.2)
Total Protein: 5.7 g/dL — ABNORMAL LOW (ref 6.5–8.1)

## 2020-11-28 LAB — MAGNESIUM: Magnesium: 2.2 mg/dL (ref 1.7–2.4)

## 2020-11-28 LAB — PHOSPHORUS: Phosphorus: 2.7 mg/dL (ref 2.5–4.6)

## 2020-11-28 MED ORDER — WHITE PETROLATUM EX OINT
TOPICAL_OINTMENT | CUTANEOUS | Status: AC
  Administered 2020-11-28: 1
  Filled 2020-11-28: qty 28.35

## 2020-11-28 MED ORDER — ORAL CARE MOUTH RINSE
15.0000 mL | Freq: Two times a day (BID) | OROMUCOSAL | Status: DC
  Administered 2020-11-29 – 2020-12-09 (×21): 15 mL via OROMUCOSAL

## 2020-11-28 MED ORDER — CHLORHEXIDINE GLUCONATE 0.12 % MT SOLN
15.0000 mL | Freq: Two times a day (BID) | OROMUCOSAL | Status: DC
  Administered 2020-11-29 – 2020-12-09 (×20): 15 mL via OROMUCOSAL
  Filled 2020-11-28 (×16): qty 15

## 2020-11-28 NOTE — Progress Notes (Signed)
Pt was extubated and placed on 4L nasal cannula. BBS heard throughout. RN was at bedside for extubation. No shortness of breath noted at this time.

## 2020-11-28 NOTE — Progress Notes (Signed)
NEUROLOGY CONSULTATION PROGRESS NOTE   Date of service: November 28, 2020 Patient Name: CHANCI OJALA MRN:  [id number] DOB:  1941/06/08  Brief HPI   Briefly, Ms. SHELETHA BOW is a 80 year old female who was at her baseline at home and then presented with new onset seizure, briefly lost pulses and required chest compressions, ROSC was achieved in about 2 minutes.  Workup with MRI Brain concerning for potential Amyloid angiopathy, white matter disease. cEEGwith no further seizures.   Interval Hx   Extubated, oriented to self, does notreport any pain, slowed thought process and poor attention.  Vitals   Vitals:   11/28/20 0600 11/28/20 0700 11/28/20 0755 11/28/20 0900  BP: (!) 123/97 116/71    Pulse: (!) 56 (!) 52 66   Resp: (!) 22 18 (!) 8   Temp: (!) 96.8 F (36 C)     TempSrc:      SpO2: 100% 100% 94% (!) 60%  Weight:      Height:         Body mass index is 20.08 kg/m.  Physical Exam   General: Laying comfortably in bed; in no acute distress. HENT: Normal oropharynx and mucosa, mouth open. Normal external appearance of ears and nose. Neck: Supple, no pain or tenderness CV: No JVD. No peripheral edema. Pulmonary: Symmetric Chest rise. Normal respiratory effort. Abdomen: Soft to touch, non-tender. Ext: No cyanosis, edema, hammer toes BL Skin: No rash. Normal palpation of skin.  Musculoskeletal: Normal digits and nails by inspection. No clubbing.  Neurologic Examination  Mental status/Cognition: somnolent, opens eyes to loud voice, requires a lot of encouragement to keep her eyes open. Oriented to self only, poor attention and keeps falling asleep during exam. Speech/language: Non fluent, soft and hoarse voice, probably due to intubation, comprehension is intact to very simple commands,  Cranial nerves:   CN II Pupils equal and reactive to light, blinks to threat BL   CN III,IV,VI EOM intact, no gaze preference or deviation, no nystagmus   CN V    CN VII no  asymmetry, no nasolabial fold flattening   CN VIII Turns towards speech   CN IX & X normal palatal elevation, no uvular deviation   CN XI    CN XII midline tongue protrusion   Motor:  Muscle bulk: poor, tone normal, tremor intention tremor BL. Mvmt Root Nerve  Muscle Right Left Comments  SA C5/6 Ax Deltoid 3 3   EF C5/6 Mc Biceps 4 4   EE C6/7/8 Rad Triceps 4 4   WF C6/7 Med FCR 4 4   WE C7/8 PIN ECU 4 4   F Ab C8/T1 U ADM/FDI 4+ 4+   HF L1/2/3 Fem Illopsoas 3 3   KE L2/3/4 Fem Quad 4 4   DF L4/5 D Peron Tib Ant 4+ 4+   PF S1/2 Tibial Grc/Sol      Reflexes:  Right Left Comments  Pectoralis      Biceps (C5/6) 1 1   Brachioradialis (C5/6) 1 1    Triceps (C6/7) 1 1    Patellar (L3/4) 1 1    Achilles (S1)      Hoffman      Plantar     Jaw jerk    Sensation:  Light touch Grossly intact to touch in all extremities.   Pin prick    Temperature    Vibration   Proprioception    Coordination/Complex Motor:  - Finger to Nose unable to assess, does have tremors  with movements. - Heel to shin unable to do. - Rapid alternating movement are slowed. - Gait: unable to assess due to weakness and encephalopathy.  Labs   Basic Metabolic Panel:  Lab Results  Component Value Date   NA 138 11/28/2020   K 5.1 11/28/2020   CO2 21 (L) 11/28/2020   GLUCOSE 156 (H) 11/28/2020   BUN 19 11/28/2020   CREATININE 0.76 11/28/2020   CALCIUM 8.8 (L) 11/28/2020   GFRNONAA >60 11/28/2020   GFRAA >60 01/26/2017   HbA1c:  Lab Results  Component Value Date   HGBA1C 5.5 11/25/2020   LDL:  Lab Results  Component Value Date   LDLCALC 116 (H) 04/15/2016   Urine Drug Screen: No results found for: LABOPIA, COCAINSCRNUR, LABBENZ, AMPHETMU, THCU, LABBARB  Alcohol Level No results found for: ETH No results found for: PHENYTOIN, ZONISAMIDE, LAMOTRIGINE, LEVETIRACETA No results found for: PHENYTOIN, PHENOBARB, VALPROATE, CBMZ  Imaging and Diagnostic studies   MRI Brain with and without  contrast: 1. Multiple foci of susceptibility artifact in the bilateral cerebral hemispheres, more pronounced in the left occipital region where there is associated subtle T2 hyperintensity of the subcortical white matter and some of the foci appear to be cortical. Findings may represent chronic microhemorrhages related to hypertension or amyloid angiopathy. 2. Remote lacunar infarct in the right basal ganglia region. 3. Scattered foci of T2 hyperintensity within the white matter of the cerebral hemispheres and pons, nonspecific, most likely related to chronic small vessel ischemia.  CEEG: This study issuggestive of moderate to severe diffuse encephalopathy, nonspecific etiology.No seizures or definite epileptiform discharges were seen throughout the recording.  Labs: No significant abrnomalities except for leukocytosis which developed in the hospital and was not there on presentation and likely due to aspiration that was noted on CT.  Impression   DOMITILA STETLER is a 80 y.o. female presenting with new onset seizure (ictal cry > figure of 4 sign with her R hand up > R gaze deviation and then full body rhythmic GTC) resulting in aspiration and brief arrest with ROSC in 2 mins. Now extubated, opens eyes to voice, is able to follow 1 step commands and no gaze deviation or preference. My suspicion for encephalitis or meningitis is low. She did not have any symptoms prior to presentation concerning for potential meningitis and on my discussion with family, she had no sick symptoms, no fever, chills, no headache. Her MRI Brain with and without contrast does not show any contrast enhancing lesion. She does have a microhemorrhage in the L occipital region that does seem to involve the cortex. The semiology of the seizure that I witnessed with R gaze deviation suggests that the seizure probbably was left hemispheric in origin. As for the leukocytosis, I think aspiration in the setting of a seizure is  a better alternative explanation. CTA does show aspiration and debri in R mainstem bronchus, CXR shows decreased R lung aeration.  I would lean towards holding off on CNS coverage at this time.  Recommendations  - Hold off on CNS meningitis coverage at this time, I think aspiration likely explains leukocytosis better. - Continue Keppra 500mg  BID - Seizure precautions - Ativan 1mg  for any seizure activity lasting more than 3 mins. - No driving for 6 months. ______________________________________________________________________   Thank you for the opportunity to take part in the care of this patient. If you have any further questions, please contact the neurology consultation attending.  Signed,  [place] Pager Number [phone]

## 2020-11-28 NOTE — Progress Notes (Signed)
NAME:  Kristine Francis, MRN:  [id number], DOB:  [birth year], LOS: 3 ADMISSION DATE:  11/25/2020, CONSULTATION DATE:  11/25/20 REFERRING MD:  Steffanie Dunn  CHIEF COMPLAINT:  AMS   Brief History   Kristine Francis is a 80 y.o. female who was admitted 1/5 with AMS and probable seizures complicated by aspiration PNA.  She required intubation in ED.Of note, she had roughly 2 minutes CPR prior to intubation for pulselessness and apnea.  Pulses noted after 1 round, sinus tach.  CTA and Head CT were negative for infarct. Started on continuous EEG by neurology and PCCM admitted. No prior history of seizures.   Past Medical History  has ANEMIA-IRON DEFICIENCY; EMPHYSEMA; Asthma; GERD; DIVERTICULOSIS OF COLON; RENAL CYST; CALLUS, TOE; hip pain; CERVICAL RADICULOPATHY, RIGHT; OSTEOPENIA; ATAXIA; LEG CRAMPS, NOCTURNAL; Routine health maintenance; Gait disorder; Nystagmus, end-position; Gait abnormality; Left inguinal hernia; Chest pain; Dysuria; Acute cystitis without hematuria; Hyperlipidemia; Essential hypertension; Visual disturbances; Incontinence of feces; Falls infrequently; Groin mass; Luetscher's syndrome; Cerebellar ataxia ([place]); Sensory neuropathy; Sensory polyneuropathy; Seizures ([place]); Acute encephalopathy; Acute respiratory failure with hypoxia ([place]); Aspiration into airway; Cardiac arrest ([place]); and Malnutrition of moderate degree on their problem list.  Significant Hospital Events   1/5 > Admit 1/5 > PEA arrest, intubation  Consults:  Neuro, PCCM  Procedures:  ETT 1/5 > 1/8  Significant Diagnostic Tests:  CTH 1/5 > neg for acute infarct or hemorrhage. CTAH 1/5 > no LVO. cEEG 1/5 > diffuse encephalopathy, no seizures MRI brain 1/5 > meg  Micro Data:  COVID 1/5 > neg. Flu 1/5 > neg. Blood 1/5 > neg Sputum 1/7 >   Antimicrobials:  Vanc1/5 >   Ceftriaxone 1/5 > Ampicillin 1/5 >  Acyclovir 1/5 >   Interim history/subjective:  No acute events overnight. Some agitation requiring fentanyl  dosing overnight.   Patient awake and following commands this morning. Extubated this morning to nasal canula.  Objective:  Blood pressure 124/72, pulse 76, temperature (!) 96.8 F (36 C), resp. rate (!) 21, height 5\' 1"  (1.549 m), weight 48.2 kg, SpO2 92 %.    Vent Mode: PRVC FiO2 (%):  [40 %] 40 % Set Rate:  [18 bmp] 18 bmp Vt Set:  [380 mL] 380 mL PEEP:  [8 cmH20] 8 cmH20 Plateau Pressure:  [25 cmH20-36 cmH20] 29 cmH20   Intake/Output Summary (Last 24 hours) at 11/28/2020 1050 Last data filed at 11/28/2020 0747 Gross per 24 hour  Intake 1830.97 ml  Output 530 ml  Net 1300.97 ml   Filed Weights   11/26/20 2000 11/27/20 0215 11/28/20 0435  Weight: 46.1 kg 46.1 kg 48.2 kg    Examination: General: Chronically ill-appearing, elderly woman, no acute distress Neuro: somnolent but follows commands. Alert and oriented to person and place. HEENT: Normocephalic, atraumatic. Pupils equal, reactive. Cardiovascular: Regular rate, rhythm. No murmurs, rubs, gallops. Lungs: diminished breath sounds bilaterally. No wheezing. Some scattered rhonchi Abdomen: Soft, non-tender, nondistended Musculoskeletal: No pitting edema bilaterally. Thin muscles throughout. Skin: Intact, warm, no rashes.  Assessment & Plan:  Kristine Francis 80yo female with previous CVA, hypertension, hyperlipidemia, no known history of seizures admitted 1/5 with acute metabolic encephalopathy with GTC seizure, PEA arrest, and aspiration event occurring in ED.  #Acute metabolic encephalopathy EEG negative for further seizures, MRI negative for acute stroke. Continues to be afebrile, leukocytosis most likely 2/2 steroids given in ED. UA negative, BCx NGTD. Pending aspirate cultures. Possibly meningitis, patient reportedly around young grandchildren recently. Other possible etiologies include hypertensive emergency v other  metabolic derangements. Started on empiric antibiotics for meningitis.  - C/w vancomycin, ceftriaxone,  ampicillin, acyclovir  - Keppra 500mg  BID per neurology - Seizure precautions - CBC, BMP daily - F/u cultures - D/c sedation medications - Precedex PRN for agitation  #Acute hypoxic respiratory failure #Aspiration event In setting of altered mentation and likely aspiration event - Extubated this AM with improving mental status - Continue broad spectrum antibiotics as above. Will consider narrowing to ceftriaxone alone for pneumonia coverage based on neuro recommendations. - f/u respiratory culture  #PEA arrest In ED patient had PEA arrest w/ ROSC after 1 round CPR. Trop 22. TTE pending today. C/w home ASA. - TTE without significant abnormalities. Normal EF and RV function. - C/w home ASA  #Hyperglycemia CBG's appropriate after being given steroids in ED. A1c 5.5. - CBG q4h - SSI  #Hyperkalemia In setting of aggressive repletion - continue to monitor  #Hypophosphatemia - continue to monitor -replete as needed  #Hx hypertension Holding home medications  Best Practice (evaluated daily):  Diet: NPO. Will have speech evaluate once more alert Pain/Anxiety/Delirium protocol (if indicated): D/c fentanyl, propofol, versed. Can start Precedex if needed VAP protocol (if indicated): In place. DVT prophylaxis: SCD's / Heparin. GI prophylaxis: PPI Glucose control: SSI Mobility: Bedrest Disposition: ICU  Goals of Care:  Last date of multidisciplinary goals of care discussion: Husband updated 1/8 AM, GOC not discussed Family and staff present: n/a Summary of discussion: n/a Follow up goals of care discussion due: 12/01/20. Code Status: FULL  Labs   CBC: Recent Labs  Lab 11/25/20 1439 11/25/20 1451 11/26/20 0444 11/26/20 0507 11/26/20 1041 11/27/20 0614 11/28/20 0338  WBC 8.8  --   --  16.0*  --  15.9* 15.0*  NEUTROABS 6.1  --   --   --   --  14.1*  --   HGB 13.9   < > 11.2* 12.8 11.6* 11.2* 11.8*  HCT 42.3   < > 33.0* 37.9 34.0* 34.6* 36.2  MCV 92.2  --   --  92.9   --  92.3 92.6  PLT 277  --   --  263  --  219 187   < > = values in this interval not displayed.   Basic Metabolic Panel: Recent Labs  Lab 11/25/20 1439 11/25/20 1451 11/26/20 0444 11/26/20 0507 11/26/20 1041 11/27/20 0614 11/28/20 0338  NA 137 138 138 139 139 136 138  K 3.6 3.5 3.4* 3.8 3.3* 3.0* 5.1  CL 100 99  --  105  --  105 108  CO2 24  --   --  20*  --  20* 21*  GLUCOSE 172* 170*  --  124*  --  130* 156*  BUN 16 18  --  16  --  16 19  CREATININE 0.89 0.70  --  0.78  --  1.01* 0.76  CALCIUM 9.5  --   --  8.4*  --  8.0* 8.8*  MG  --   --   --  1.6*  --  2.1 2.2  PHOS  --   --   --  3.5  --   --  2.7   GFR: Estimated Creatinine Clearance: 43.7 mL/min (by C-G formula based on SCr of 0.76 mg/dL). Recent Labs  Lab 11/25/20 1439 11/26/20 0507 11/27/20 0614 11/28/20 0338  WBC 8.8 16.0* 15.9* 15.0*   Liver Function Tests: Recent Labs  Lab 11/25/20 1439 11/28/20 0338  AST 22 30  ALT 15 19  ALKPHOS 63  54  BILITOT 1.8* 0.9  PROT 6.8 5.7*  ALBUMIN 3.8 2.5*   No results for input(s): LIPASE, AMYLASE in the last 168 hours. No results for input(s): AMMONIA in the last 168 hours. ABG    Component Value Date/Time   PHART 7.330 (L) 11/26/2020 1041   PCO2ART 45.3 11/26/2020 1041   PO2ART 56 (L) 11/26/2020 1041   HCO3 23.9 11/26/2020 1041   TCO2 25 11/26/2020 1041   ACIDBASEDEF 2.0 11/26/2020 1041   O2SAT 86.0 11/26/2020 1041    Coagulation Profile: Recent Labs  Lab 11/25/20 1439  INR 1.0   Cardiac Enzymes: No results for input(s): CKTOTAL, CKMB, CKMBINDEX, TROPONINI in the last 168 hours. HbA1C: Hgb A1c MFr Bld  Date/Time Value Ref Range Status  11/25/2020 08:10 PM 5.5 4.8 - 5.6 % Final    Comment:    (NOTE) Pre diabetes:          5.7%-6.4%  Diabetes:              >6.4%  Glycemic control for   <7.0% adults with diabetes   08/24/2015 03:36 PM 6.0 4.6 - 6.5 % Final    Comment:    Glycemic Control Guidelines for People with Diabetes:Non Diabetic:   <6%Goal of Therapy: <7%Additional Action Suggested:  >8%    CBG: Recent Labs  Lab 11/27/20 1623 11/27/20 1948 11/27/20 2356 11/28/20 0421 11/28/20 0820  GLUCAP 107* 114* 147* 142* 153*    Critical Care Time: 35 minutes  Freda Jackson, MD [facility] Office: [phone]   See Amion for Pager Details

## 2020-11-29 DIAGNOSIS — J9601 Acute respiratory failure with hypoxia: Secondary | ICD-10-CM | POA: Diagnosis not present

## 2020-11-29 DIAGNOSIS — G9341 Metabolic encephalopathy: Secondary | ICD-10-CM | POA: Diagnosis not present

## 2020-11-29 DIAGNOSIS — R569 Unspecified convulsions: Secondary | ICD-10-CM | POA: Diagnosis not present

## 2020-11-29 LAB — CULTURE, RESPIRATORY W GRAM STAIN: Gram Stain: NONE SEEN

## 2020-11-29 LAB — GLUCOSE, CAPILLARY
Glucose-Capillary: 115 mg/dL — ABNORMAL HIGH (ref 70–99)
Glucose-Capillary: 116 mg/dL — ABNORMAL HIGH (ref 70–99)
Glucose-Capillary: 118 mg/dL — ABNORMAL HIGH (ref 70–99)
Glucose-Capillary: 124 mg/dL — ABNORMAL HIGH (ref 70–99)
Glucose-Capillary: 134 mg/dL — ABNORMAL HIGH (ref 70–99)
Glucose-Capillary: 88 mg/dL (ref 70–99)

## 2020-11-29 LAB — CBC
HCT: 33.3 % — ABNORMAL LOW (ref 36.0–46.0)
Hemoglobin: 11 g/dL — ABNORMAL LOW (ref 12.0–15.0)
MCH: 30.5 pg (ref 26.0–34.0)
MCHC: 33 g/dL (ref 30.0–36.0)
MCV: 92.2 fL (ref 80.0–100.0)
Platelets: 192 K/uL (ref 150–400)
RBC: 3.61 MIL/uL — ABNORMAL LOW (ref 3.87–5.11)
RDW: 14 % (ref 11.5–15.5)
WBC: 14.6 K/uL — ABNORMAL HIGH (ref 4.0–10.5)
nRBC: 0 % (ref 0.0–0.2)

## 2020-11-29 LAB — BASIC METABOLIC PANEL WITH GFR
Anion gap: 15 (ref 5–15)
BUN: 18 mg/dL (ref 8–23)
CO2: 18 mmol/L — ABNORMAL LOW (ref 22–32)
Calcium: 8.8 mg/dL — ABNORMAL LOW (ref 8.9–10.3)
Chloride: 108 mmol/L (ref 98–111)
Creatinine, Ser: 0.68 mg/dL (ref 0.44–1.00)
GFR, Estimated: >60 mL/min
Glucose, Bld: 101 mg/dL — ABNORMAL HIGH (ref 70–99)
Potassium: 3.6 mmol/L (ref 3.5–5.1)
Sodium: 141 mmol/L (ref 135–145)

## 2020-11-29 MED ORDER — HYDRALAZINE HCL 20 MG/ML IJ SOLN
10.0000 mg | INTRAMUSCULAR | Status: DC | PRN
  Administered 2020-11-29 – 2020-12-02 (×4): 10 mg via INTRAVENOUS
  Filled 2020-11-29 (×4): qty 1

## 2020-11-29 MED ORDER — SODIUM CHLORIDE 0.9 % IV SOLN
2.0000 g | INTRAVENOUS | Status: AC
  Administered 2020-11-30: 2 g via INTRAVENOUS
  Filled 2020-11-29: qty 20

## 2020-11-29 MED ORDER — LACTATED RINGERS IV SOLN: INTRAVENOUS | Status: AC

## 2020-11-29 MED ORDER — LIP MEDEX EX OINT
TOPICAL_OINTMENT | CUTANEOUS | Status: DC | PRN
  Filled 2020-11-29: qty 7

## 2020-11-29 MED ORDER — LORAZEPAM 2 MG/ML IJ SOLN
0.5000 mg | Freq: Four times a day (QID) | INTRAMUSCULAR | Status: DC | PRN
  Administered 2020-11-29: 0.5 mg via INTRAVENOUS
  Filled 2020-11-29: qty 1

## 2020-11-29 MED ORDER — HALOPERIDOL LACTATE 5 MG/ML IJ SOLN
INTRAMUSCULAR | Status: AC
  Administered 2020-11-29: 2 mg via INTRAVENOUS
  Filled 2020-11-29: qty 1

## 2020-11-29 MED ORDER — QUETIAPINE FUMARATE 25 MG PO TABS: 25.0000 mg | ORAL_TABLET | Freq: Every day | ORAL | Status: DC

## 2020-11-29 MED ORDER — PHENYLEPHRINE 40 MCG/ML (10ML) SYRINGE FOR IV PUSH (FOR BLOOD PRESSURE SUPPORT)
PREFILLED_SYRINGE | INTRAVENOUS | Status: AC
  Filled 2020-11-29: qty 10

## 2020-11-29 MED ORDER — HALOPERIDOL LACTATE 5 MG/ML IJ SOLN: 2.0000 mg | Freq: Once | INTRAMUSCULAR | Status: AC | PRN

## 2020-11-29 MED ORDER — LORAZEPAM 2 MG/ML IJ SOLN
INTRAMUSCULAR | Status: AC
  Administered 2020-11-29: 0.5 mg via INTRAVENOUS
  Filled 2020-11-29: qty 1

## 2020-11-29 MED ORDER — LIDOCAINE HCL URETHRAL/MUCOSAL 2 % EX GEL
1.0000 "application " | Freq: Once | CUTANEOUS | Status: DC
  Filled 2020-11-29: qty 11

## 2020-11-29 MED ORDER — QUETIAPINE FUMARATE ER 50 MG PO TB24
50.0000 mg | ORAL_TABLET | Freq: Every day | ORAL | Status: DC
  Filled 2020-11-29: qty 1

## 2020-11-29 MED ORDER — SODIUM CHLORIDE 0.9 % IV SOLN: 2.0000 g | INTRAVENOUS | Status: DC

## 2020-11-29 MED ORDER — LORAZEPAM 2 MG/ML IJ SOLN: 0.5000 mg | Freq: Once | INTRAMUSCULAR | Status: AC

## 2020-11-29 NOTE — Progress Notes (Signed)
eLink Physician-Brief Progress Note Patient Name: Kristine Francis DOB: 04-13-1941 MRN: [id number]   Date of Service  11/29/2020  HPI/Events of Note  RN requesting order for Zyprexa, said it was supposed to be ordered but she did not see the order.  eICU Interventions  Would avoid zyprexa, due to seizures. On precedex and ativan.  Neurology to follow up on in AM.      Intervention Category Minor Interventions: Agitation / anxiety - evaluation and management  Elmer Sow 11/29/2020, 9:03 PM

## 2020-11-29 NOTE — Progress Notes (Signed)
NAME:  Kristine Francis, MRN:  [id number], DOB:  Jun 14, 1941, LOS: 4 ADMISSION DATE:  11/25/2020, CONSULTATION DATE:  11/25/20 REFERRING MD:  Steffanie Dunn  CHIEF COMPLAINT:  AMS   Brief History   Kristine Francis is a 80 y.o. female who was admitted 1/5 with AMS and probable seizures complicated by aspiration PNA.  She required intubation in ED.Of note, she had roughly 2 minutes CPR prior to intubation for pulselessness and apnea.  Pulses noted after 1 round, sinus tach.  CTA and Head CT were negative for infarct. Started on continuous EEG by neurology and PCCM admitted. No prior history of seizures.   Past Medical History  has ANEMIA-IRON DEFICIENCY; EMPHYSEMA; Asthma; GERD; DIVERTICULOSIS OF COLON; RENAL CYST; CALLUS, TOE; hip pain; CERVICAL RADICULOPATHY, RIGHT; OSTEOPENIA; ATAXIA; LEG CRAMPS, NOCTURNAL; Routine health maintenance; Gait disorder; Nystagmus, end-position; Gait abnormality; Left inguinal hernia; Chest pain; Dysuria; Acute cystitis without hematuria; Hyperlipidemia; Essential hypertension; Visual disturbances; Incontinence of feces; Falls infrequently; Groin mass; Luetscher's syndrome; Cerebellar ataxia ([place]); Sensory neuropathy; Sensory polyneuropathy; Seizures ([place]); Acute encephalopathy; Acute respiratory failure with hypoxia ([place]); Aspiration into airway; Cardiac arrest ([place]); and Malnutrition of moderate degree on their problem list.  Significant Hospital Events   1/5 > Admit 1/5 > PEA arrest, intubation  Consults:  Neuro, PCCM  Procedures:  ETT 1/5 > 1/8  Significant Diagnostic Tests:  CTH 1/5 > neg for acute infarct or hemorrhage. CTAH 1/5 > no LVO. cEEG 1/5 > diffuse encephalopathy, no seizures MRI brain 1/5 > meg  Micro Data:  COVID 1/5 > neg. Flu 1/5 > neg. Blood 1/5 > neg Sputum 1/7 > neg  Antimicrobials:  Vanc1/5 >  1/8 Ceftriaxone 1/5 > Ampicillin 1/5 > 1/8  Acyclovir 1/5 > 1/8  Interim history/subjective:  No acute events overnight. Continued to need  Precedex, planning on weaning this AM and can re-assess need for ICU level care this PM.  Objective:  Blood pressure (!) 151/102, pulse 67, temperature 97.8 F (36.6 C), temperature source Axillary, resp. rate (!) 22, height 5\' 1"  (1.549 m), weight 49.1 kg, SpO2 91 %.        Intake/Output Summary (Last 24 hours) at 11/29/2020 0919 Last data filed at 11/29/2020 0900 Gross per 24 hour  Intake 1464.71 ml  Output 700 ml  Net 764.71 ml   Filed Weights   11/27/20 0215 11/28/20 0435 11/29/20 0409  Weight: 46.1 kg 48.2 kg 49.1 kg    Examination: General: Chronically ill-appearing, elderly woman, no acute distress Neuro: Somnolent, follows commands. Alert and oriented to person, place. Recognizes husband. HEENT: Normocephalic, atraumatic. Pupils equal, reactive. Cardiovascular: Regular rate, rhythm. No murmurs, rubs, gallops. Lungs: Diminished breath sounds bilaterally. No wheezing. Some scattered rhonchi Abdomen: Soft, non-tender, nondistended Musculoskeletal: No pitting edema bilaterally. Thin muscles throughout. Skin: Intact, warm, no rashes.  Assessment & Plan:  Kristine Francis 80yo female with previous CVA, hypertension, hyperlipidemia, no known history of seizures admitted 1/5 with acute metabolic encephalopathy with GTC seizure, PEA arrest, and aspiration event occurring in ED.  #Acute metabolic encephalopathy #Seizures Witnessed seizure by neurology yesterday. Most likely encephalopathy d/t seizure activity. Continues to be afebrile. Leukocytosis 2/2 pneumonia vs s/p high dose steroids. Cultures negative so far, UA negative. Discussed with neurology, recommended d/c meningitis coverage. Will continue with AED per neurology. Weaning down precedex this AM and pending mental status will have SLP eval vs Cortrak placed this PM. - C/w ceftriaxone - Keppra 500mg  BID per neurology - Seizure precautions - CBC, BMP daily - Wean  down Precedex - SLP eval v Cortrak this PM  #Acute hypoxic  respiratory failure #Aspiration pneumonia Clinical picture most consistent with seizure-induced aspiration event leading to aspiration pneumonia. Patient deconditioned right now, making it difficult to clear sputum. Hopeful if mental status improves can get chest physiotherapy and PT/OT to assist with strength. Continues to be on 6L [place], wean as tolerated. - C/w ceftriaxone - Wean supplemental oxygen as tolerated - Chest PT pending mental status - PT/OT pending mental status  #PEA arrest In ED patient had PEA arrest w/ ROSC after 1 round CPR. Trop 22. TTE w/o significant abnormalities.  - C/w home ASA.  #Hypertension BP elevated to systolic [id number] intermittently. Adding PRN medication for now, can transition to oral once she is able to wean off Precedex. No antihypertensives listed under home medications. - IV hydralazine 10mg  q4 PRN  #Hyperglycemia CBG's appropriate after being given steroids in ED. A1c 5.5. - CBG q4h - SSI  #Hyperkalemia In setting of aggressive repletion - continue to monitor  #Hypophosphatemia - continue to monitor -replete as needed  Best Practice (evaluated daily):  Diet: NPO. Will have speech evaluate once more alert Pain/Anxiety/Delirium protocol (if indicated): Precdex, attempting to wean VAP protocol (if indicated): In place. DVT prophylaxis: SCD's / Heparin. GI prophylaxis: PPI Glucose control: SSI Mobility: Bedrest Disposition: ICU  Goals of Care:  Last date of multidisciplinary goals of care discussion: Husband updated 1/9 AM, GOC not discussed Family and staff present: n/a Summary of discussion: n/a Follow up goals of care discussion due: 12/01/20. Code Status: FULL  Labs   CBC: Recent Labs  Lab 11/25/20 1439 11/25/20 1451 11/26/20 0507 11/26/20 1041 11/27/20 0614 11/28/20 0338 11/29/20 0641  WBC 8.8  --  16.0*  --  15.9* 15.0* 14.6*  NEUTROABS 6.1  --   --   --  14.1*  --   --   HGB 13.9   < > 12.8 11.6* 11.2* 11.8* 11.0*  HCT  42.3   < > 37.9 34.0* 34.6* 36.2 33.3*  MCV 92.2  --  92.9  --  92.3 92.6 92.2  PLT 277  --  263  --  219 187 192   < > = values in this interval not displayed.   Basic Metabolic Panel: Recent Labs  Lab 11/25/20 1439 11/25/20 1451 11/26/20 0444 11/26/20 0507 11/26/20 1041 11/27/20 0614 11/28/20 0338  NA 137 138 138 139 139 136 138  K 3.6 3.5 3.4* 3.8 3.3* 3.0* 5.1  CL 100 99  --  105  --  105 108  CO2 24  --   --  20*  --  20* 21*  GLUCOSE 172* 170*  --  124*  --  130* 156*  BUN 16 18  --  16  --  16 19  CREATININE 0.89 0.70  --  0.78  --  1.01* 0.76  CALCIUM 9.5  --   --  8.4*  --  8.0* 8.8*  MG  --   --   --  1.6*  --  2.1 2.2  PHOS  --   --   --  3.5  --   --  2.7   GFR: Estimated Creatinine Clearance: 43.7 mL/min (by C-G formula based on SCr of 0.76 mg/dL). Recent Labs  Lab 11/26/20 0507 11/27/20 0614 11/28/20 0338 11/29/20 0641  WBC 16.0* 15.9* 15.0* 14.6*   Liver Function Tests: Recent Labs  Lab 11/25/20 1439 11/28/20 0338  AST 22 30  ALT 15 19  ALKPHOS 63 54  BILITOT 1.8* 0.9  PROT 6.8 5.7*  ALBUMIN 3.8 2.5*   No results for input(s): LIPASE, AMYLASE in the last 168 hours. No results for input(s): AMMONIA in the last 168 hours. ABG    Component Value Date/Time   PHART 7.330 (L) 11/26/2020 1041   PCO2ART 45.3 11/26/2020 1041   PO2ART 56 (L) 11/26/2020 1041   HCO3 23.9 11/26/2020 1041   TCO2 25 11/26/2020 1041   ACIDBASEDEF 2.0 11/26/2020 1041   O2SAT 86.0 11/26/2020 1041    Coagulation Profile: Recent Labs  Lab 11/25/20 1439  INR 1.0   Cardiac Enzymes: No results for input(s): CKTOTAL, CKMB, CKMBINDEX, TROPONINI in the last 168 hours. HbA1C: Hgb A1c MFr Bld  Date/Time Value Ref Range Status  11/25/2020 08:10 PM 5.5 4.8 - 5.6 % Final    Comment:    (NOTE) Pre diabetes:          5.7%-6.4%  Diabetes:              >6.4%  Glycemic control for   <7.0% adults with diabetes   08/24/2015 03:36 PM 6.0 4.6 - 6.5 % Final    Comment:     Glycemic Control Guidelines for People with Diabetes:Non Diabetic:  <6%Goal of Therapy: <7%Additional Action Suggested:  >8%    CBG: Recent Labs  Lab 11/28/20 1602 11/28/20 1934 11/28/20 2357 11/29/20 0402 11/29/20 0842  GLUCAP 98 150* 115* 88 116*    Critical Care Time: 30 minutes  Sanjuan Dame, MD Internal Medicine PGY-1 Pager: [phone]

## 2020-11-29 NOTE — Progress Notes (Signed)
NEUROLOGY CONSULTATION PROGRESS NOTE   Date of service: November 29, 2020 Patient Name: Kristine Francis MRN:  [id number] DOB:  1941-09-26  Brief HPI   Briefly, Kristine Francis is a 80 year old female who was at her baseline at home and then presented with new onset seizure, briefly lost pulses and required chest compressions, ROSC was achieved in about 2 minutes.  Workup with MRI Brain concerning for potential Amyloid angiopathy, white matter disease. cEEGwith no further seizures.  Low concern for meningitis and therefore meningitis coverage was discontinued on 1/8.  Patient  extubated on 1/8 and following basic commands but encephalopathic.   Interval Hx   Started on precedex for agitation. Team is weaning it down. Husband at bedside reports that she recognizes him and does seem to wake up at times. In mitts. Meningitis coverage discontinued.  Vitals   Vitals:   11/29/20 0507 11/29/20 0510 11/29/20 0600 11/29/20 0700  BP: (!) 173/98  (!) 154/71 (!) 151/102  Pulse: (!) 47   67  Resp: 12  12 (!) 22  Temp:  97.8 F (36.6 C)    TempSrc:  Axillary    SpO2: 100%   91%  Weight:      Height:         Body mass index is 20.45 kg/m.  Physical Exam   General: Laying comfortably in bed; in no acute distress. HENT: Normal oropharynx and mucosa, mouth open. Normal external appearance of ears and nose. Neck: Supple, no pain or tenderness CV: No JVD. No peripheral edema. Pulmonary: Symmetric Chest rise. Normal respiratory effort. Abdomen: Soft to touch, non-tender. Ext: No cyanosis, edema, hammer toes BL Skin: No rash. Normal palpation of skin.  Musculoskeletal: Normal digits and nails by inspection. No clubbing.  Neurologic Examination  Mental status/Cognition: somnolent, opens eyes to loud voice, oriented to self, recognizes husband, poor attention and keeps falling asleep during exam. Speech/language: Non fluent, soft and hoarse voice, probably due to intubation, comprehension  is intact to very simple commands,  Cranial nerves:   CN II Pupils equal and reactive to light, blinks to threat BL   CN III,IV,VI EOM intact, no gaze preference or deviation, no nystagmus   CN V    CN VII no asymmetry, no nasolabial fold flattening   CN VIII Turns towards speech   CN IX & X normal palatal elevation, no uvular deviation   CN XI    CN XII midline tongue protrusion   Motor:  Muscle bulk: poor, tone normal, tremor intention tremor BL. Mvmt Root Nerve  Muscle Right Left Comments  SA C5/6 Ax Deltoid 3 3   EF C5/6 Mc Biceps 4 4   EE C6/7/8 Rad Triceps 4 4   WF C6/7 Med FCR 4 4   WE C7/8 PIN ECU 4 4   F Ab C8/T1 U ADM/FDI 4+ 4+   HF L1/2/3 Fem Illopsoas 3 3   KE L2/3/4 Fem Quad 4 4   DF L4/5 D Peron Tib Ant 4+ 4+   PF S1/2 Tibial Grc/Sol      Reflexes:  Right Left Comments  Pectoralis      Biceps (C5/6) 1 1   Brachioradialis (C5/6) 1 1    Triceps (C6/7) 1 1    Patellar (L3/4) 1 1    Achilles (S1)      Hoffman      Plantar     Jaw jerk    Sensation:  Light touch Grossly intact to touch in all extremities.  Pin prick    Temperature    Vibration   Proprioception    Coordination/Complex Motor:  - Finger to Nose with no ataxia out of proportion to weakness. - Heel to shin unable to do. - Rapid alternating movement are slowed. - Gait: unable to assess due to weakness and encephalopathy.  Labs   Basic Metabolic Panel:  Lab Results  Component Value Date   NA 138 11/28/2020   K 5.1 11/28/2020   CO2 21 (L) 11/28/2020   GLUCOSE 156 (H) 11/28/2020   BUN 19 11/28/2020   CREATININE 0.76 11/28/2020   CALCIUM 8.8 (L) 11/28/2020   GFRNONAA >60 11/28/2020   GFRAA >60 01/26/2017   HbA1c:  Lab Results  Component Value Date   HGBA1C 5.5 11/25/2020   LDL:  Lab Results  Component Value Date   LDLCALC 116 (H) 04/15/2016   Urine Drug Screen: No results found for: LABOPIA, COCAINSCRNUR, LABBENZ, AMPHETMU, THCU, LABBARB  Alcohol Level No results found for:  ETH No results found for: PHENYTOIN, ZONISAMIDE, LAMOTRIGINE, LEVETIRACETA No results found for: PHENYTOIN, PHENOBARB, VALPROATE, CBMZ  Imaging and Diagnostic studies   MRI Brain with and without contrast: 1. Multiple foci of susceptibility artifact in the bilateral cerebral hemispheres, more pronounced in the left occipital region where there is associated subtle T2 hyperintensity of the subcortical white matter and some of the foci appear to be cortical. Findings may represent chronic microhemorrhages related to hypertension or amyloid angiopathy. 2. Remote lacunar infarct in the right basal ganglia region. 3. Scattered foci of T2 hyperintensity within the white matter of the cerebral hemispheres and pons, nonspecific, most likely related to chronic small vessel ischemia.  CEEG: This study issuggestive of moderate to severe diffuse encephalopathy, nonspecific etiology.No seizures or definite epileptiform discharges were seen throughout the recording.  Labs: Leukocytosis is stable.  Impression   Kristine Francis is a 80 y.o. female presenting with new onset seizure (ictal cry > figure of 4 sign with her R hand up > R gaze deviation and then full body rhythmic GTC) resulting in aspiration and brief arrest with ROSC in 2 mins. Now extubated, opens eyes to voice, is able to follow 1 step commands and no gaze deviation or preference. Her MRI Brain with and without contrast does not show any contrast enhancing lesion. She does have a microhemorrhage in the L occipital region that does seem to involve the cortex. The semiology of the seizure that I witnessed with R gaze deviation suggests that the seizure probbably was left hemispheric in origin.  Recommendations   - Continue Keppra 500mg  BID - Seizure precautions - Agree with weaning off Precedex. - Ativan 1mg  for any seizure activity lasting more than 3 mins. - No driving for 6 months. - Delirium  precautions. ______________________________________________________________________   Thank you for the opportunity to take part in the care of this patient. If you have any further questions, please contact the neurology consultation attending.  Signed,  [place] Pager Number [phone]

## 2020-11-29 NOTE — Progress Notes (Signed)
eLink Physician-Brief Progress Note Patient Name: Kristine Francis DOB: 1940/12/15 MRN: [id number]   Date of Service  11/29/2020  HPI/Events of Note  T 94.3 F. RN requests [employer].  eICU Interventions  Warming blanket order entered.     Intervention Category Minor Interventions: Routine modifications to care plan (e.g. PRN medications for pain, fever)  Kristine Francis 11/29/2020, 12:14 AM

## 2020-11-30 DIAGNOSIS — E440 Moderate protein-calorie malnutrition: Secondary | ICD-10-CM

## 2020-11-30 DIAGNOSIS — I639 Cerebral infarction, unspecified: Secondary | ICD-10-CM

## 2020-11-30 DIAGNOSIS — I469 Cardiac arrest, cause unspecified: Secondary | ICD-10-CM | POA: Diagnosis not present

## 2020-11-30 DIAGNOSIS — R569 Unspecified convulsions: Secondary | ICD-10-CM | POA: Diagnosis not present

## 2020-11-30 LAB — GLUCOSE, CAPILLARY
Glucose-Capillary: 121 mg/dL — ABNORMAL HIGH (ref 70–99)
Glucose-Capillary: 82 mg/dL (ref 70–99)
Glucose-Capillary: 98 mg/dL (ref 70–99)
Glucose-Capillary: 162 mg/dL — ABNORMAL HIGH (ref 70–99)
Glucose-Capillary: 167 mg/dL — ABNORMAL HIGH (ref 70–99)
Glucose-Capillary: 230 mg/dL — ABNORMAL HIGH (ref 70–99)

## 2020-11-30 LAB — BASIC METABOLIC PANEL
Anion gap: 19 — ABNORMAL HIGH (ref 5–15)
BUN: 15 mg/dL (ref 8–23)
CO2: 19 mmol/L — ABNORMAL LOW (ref 22–32)
Calcium: 8.6 mg/dL — ABNORMAL LOW (ref 8.9–10.3)
Chloride: 100 mmol/L (ref 98–111)
Creatinine, Ser: 0.54 mg/dL (ref 0.44–1.00)
GFR, Estimated: >60 mL/min (ref >60)
Glucose, Bld: 92 mg/dL (ref 70–99)
Potassium: 4.9 mmol/L (ref 3.5–5.1)
Sodium: 138 mmol/L (ref 135–145)

## 2020-11-30 LAB — CULTURE, BLOOD (ROUTINE X 2)
Culture: NO GROWTH
Culture: NO GROWTH
Special Requests: ADEQUATE

## 2020-11-30 LAB — POTASSIUM: Potassium: 4.2 mmol/L (ref 3.5–5.1)

## 2020-11-30 LAB — D-DIMER, QUANTITATIVE: D-Dimer, Quant: 1.65 ug/mL-FEU — ABNORMAL HIGH (ref 0.00–0.50)

## 2020-11-30 LAB — PHOSPHORUS: Phosphorus: 2.7 mg/dL (ref 2.5–4.6)

## 2020-11-30 LAB — MAGNESIUM: Magnesium: 1.7 mg/dL (ref 1.7–2.4)

## 2020-11-30 MED ORDER — QUETIAPINE FUMARATE ER 50 MG PO TB24
100.0000 mg | ORAL_TABLET | Freq: Every day | ORAL | Status: DC
Start: 2020-11-30 — End: 2020-11-30
  Filled 2020-11-30: qty 2

## 2020-11-30 MED ORDER — MAGNESIUM SULFATE 2 GM/50ML IV SOLN
2.0000 g | Freq: Once | INTRAVENOUS | Status: AC
  Administered 2020-11-30: 2 g via INTRAVENOUS
  Filled 2020-11-30: qty 50

## 2020-11-30 MED ORDER — LORAZEPAM 2 MG/ML IJ SOLN: 0.5000 mg | Freq: Once | INTRAMUSCULAR | Status: AC | PRN

## 2020-11-30 MED ORDER — HALOPERIDOL LACTATE 5 MG/ML IJ SOLN
2.0000 mg | Freq: Four times a day (QID) | INTRAMUSCULAR | Status: AC | PRN
  Administered 2020-11-30: 2 mg via INTRAVENOUS
  Filled 2020-11-30: qty 1

## 2020-11-30 MED ORDER — APIXABAN 5 MG PO TABS
5.0000 mg | ORAL_TABLET | Freq: Two times a day (BID) | ORAL | Status: DC
  Administered 2020-11-30 – 2020-12-05 (×11): 5 mg
  Filled 2020-11-30 (×11): qty 1

## 2020-11-30 MED ORDER — CLONIDINE HCL 0.1 MG PO TABS
0.1000 mg | ORAL_TABLET | Freq: Two times a day (BID) | ORAL | Status: DC
  Administered 2020-11-30 – 2020-12-02 (×5): 0.1 mg
  Filled 2020-11-30 (×5): qty 1

## 2020-11-30 MED ORDER — ACETAMINOPHEN 160 MG/5ML PO SOLN
650.0000 mg | Freq: Four times a day (QID) | ORAL | Status: DC | PRN
  Administered 2020-11-30: 650 mg
  Filled 2020-11-30: qty 20.3

## 2020-11-30 MED ORDER — OSMOLITE 1.2 CAL PO LIQD
1000.0000 mL | ORAL | Status: DC
  Administered 2020-11-30 – 2020-12-04 (×4): 1000 mL
  Filled 2020-11-30 (×3): qty 1000

## 2020-11-30 MED ORDER — CLONIDINE HCL 0.2 MG PO TABS: 0.1000 mg | ORAL_TABLET | Freq: Two times a day (BID) | ORAL | Status: DC

## 2020-11-30 MED ORDER — QUETIAPINE FUMARATE 50 MG PO TABS
50.0000 mg | ORAL_TABLET | Freq: Two times a day (BID) | ORAL | Status: DC
  Administered 2020-11-30 – 2020-12-01 (×4): 50 mg
  Filled 2020-11-30 (×4): qty 1

## 2020-11-30 MED ORDER — QUETIAPINE FUMARATE ER 50 MG PO TB24: 100.0000 mg | ORAL_TABLET | Freq: Two times a day (BID) | ORAL | Status: DC

## 2020-11-30 NOTE — Progress Notes (Signed)
Subjective: Kristine Francis. Has been confused intermittently, no seizure like activity.   ROS: Unable to obtain due to poor mental status  Examination  Vital signs in last 24 hours: Temp:  [97 F (36.1 C)-98.2 F (36.8 C)] 97 F (36.1 C) (01/09 1957) Pulse Rate:  [52-90] 62 (01/10 0800) Resp:  [11-25] 12 (01/10 0800) BP: (105-158)/(53-105) 157/86 (01/10 0800) SpO2:  [87 %-100 %] 98 % (01/10 0800) Weight:  [48.9 kg] 48.9 kg (01/10 0436)  General: lying in bed, trying to get out of bed CVS: pulse-normal rate and rhythm RS: breathing comfortably, coarse breath sounds Bl Extremities: normal, wamr Neuro: awake, alert, oriented to self and place, follows commands, able to name objects, moving all extremities with antigravity strength  Basic Metabolic Panel: Recent Labs  Lab 11/25/20 1439 11/25/20 1451 11/26/20 0444 11/26/20 0507 11/26/20 1041 11/27/20 0614 11/28/20 0338 11/29/20 0641 11/30/20 0125  NA 137 138   < > 139 139 136 138 141  --   K 3.6 3.5   < > 3.8 3.3* 3.0* 5.1 3.6 4.2  CL 100 99  --  105  --  105 108 108  --   CO2 24  --   --  20*  --  20* 21* 18*  --   GLUCOSE 172* 170*  --  124*  --  130* 156* 101*  --   BUN 16 18  --  16  --  16 19 18   --   CREATININE 0.89 0.70  --  0.78  --  1.01* 0.76 0.68  --   CALCIUM 9.5  --   --  8.4*  --  8.0* 8.8* 8.8*  --   MG  --   --   --  1.6*  --  2.1 2.2  --  1.7  PHOS  --   --   --  3.5  --   --  2.7  --  2.7   < > = values in this interval not displayed.    CBC: Recent Labs  Lab 11/25/20 1439 11/25/20 1451 11/26/20 0507 11/26/20 1041 11/27/20 0614 11/28/20 0338 11/29/20 0641  WBC 8.8  --  16.0*  --  15.9* 15.0* 14.6*  NEUTROABS 6.1  --   --   --  14.1*  --   --   HGB 13.9   < > 12.8 11.6* 11.2* 11.8* 11.0*  HCT 42.3   < > 37.9 34.0* 34.6* 36.2 33.3*  MCV 92.2  --  92.9  --  92.3 92.6 92.2  PLT 277  --  263  --  219 187 192   < > = values in this interval not displayed.     Coagulation Studies: No results for  input(s): LABPROT, INR in the last 72 hours.  Imaging No new brain imaging overnight  ASSESSMENT AND PLAN: 80 year old female who presented with new onset seizure. She briefly lost pulses and required chest compressions, ROSC was achieved in about 2 minutes.  New onset seizure Cerebral amyloid angiopathy Acute encephalopathy (improving) Delirium -Etiology for new onset seizure: Unclear etiology of new onset seizures.  Patient does have evidence of amyloid angiopathy with cortical involvement more so on the left side which could explain her focal seizure  Recommendations -Continue Keppra 500 mg twice daily.  Patient will need to continue antiepileptic drugs in near future due to unprovoked focal seizure. -However if agitation continues to be an issue, can consider switching to mood stabilizing agent like oxcarbazepine or Depakote. -Agree with  Seroquel, delirium precautions for now. -Continue seizure precautions -As needed IV Ativan 2 mg for clinical seizure-like activity -Management of rest of comorbidities per primary team -Updated husband at bedside -Follow-up with neurology in 8 to 10 weeks after discharge  Thank you for allowing [place] to participate in the care of this patient.  Neurology will follow peripherally.  Please call us for any further questions.  I have spent a total of  35  minutes with the patient reviewing hospital notes,  test results, labs and examining the patient as well as establishing an assessment and plan.  > 50% of time was spent in direct patient care.    Zeb Comfort Epilepsy [facility] For questions after 5pm please refer to AMION to reach the Neurologist on call

## 2020-11-30 NOTE — Procedures (Signed)
Cortrak  Person Inserting Tube:  Urian Martenson M, RD Tube Type:  Cortrak - 43 inches Tube Location:  Left nare Initial Placement:  Stomach Secured by: Bridle Technique Used to Measure Tube Placement:  Documented cm marking at nare/ corner of mouth Cortrak Secured At:  64 cm Procedure Comments:  Cortrak Tube Team Note:  Consult received to place a Cortrak feeding tube.   No x-ray is required. RN may begin using tube.    If the tube becomes dislodged please keep the tube and contact the Cortrak team at [email or web address] ([email or web address]) for replacement.  If after hours and replacement cannot be delayed, place a NG tube and confirm placement with an abdominal x-ray.     Milly Goggins, MS, RD, LDN, CNSC Inpatient Clinical Dietitian RD pager # available in AMION  After hours/weekend pager # available in AMION      

## 2020-11-30 NOTE — Progress Notes (Signed)
NAME:  Kristine Francis, MRN:  [id number], DOB:  [birth year], LOS: 5 ADMISSION DATE:  11/25/2020, CONSULTATION DATE:  11/25/20 REFERRING MD:  Steffanie Dunn  CHIEF COMPLAINT:  AMS   Brief History   Kristine Francis is a 80 y.o. female who was admitted 1/5 with AMS and probable seizures complicated by aspiration PNA.  She required intubation in ED.Of note, she had roughly 2 minutes CPR prior to intubation for pulselessness and apnea.  Pulses noted after 1 round, sinus tach.  CTA and Head CT were negative for infarct. Started on continuous EEG by neurology and PCCM admitted. No prior history of seizures.   Past Medical History  has ANEMIA-IRON DEFICIENCY; EMPHYSEMA; Asthma; GERD; DIVERTICULOSIS OF COLON; RENAL CYST; CALLUS, TOE; hip pain; CERVICAL RADICULOPATHY, RIGHT; OSTEOPENIA; ATAXIA; LEG CRAMPS, NOCTURNAL; Routine health maintenance; Gait disorder; Nystagmus, end-position; Gait abnormality; Left inguinal hernia; Chest pain; Dysuria; Acute cystitis without hematuria; Hyperlipidemia; Essential hypertension; Visual disturbances; Incontinence of feces; Falls infrequently; Groin mass; Luetscher's syndrome; Cerebellar ataxia ([place]); Sensory neuropathy; Sensory polyneuropathy; Seizures ([place]); Acute encephalopathy; Acute respiratory failure with hypoxia ([place]); Aspiration into airway; Cardiac arrest ([place]); and Malnutrition of moderate degree on their problem list.  Significant Hospital Events   1/5 > Admit 1/5 > PEA arrest, intubation  Consults:  Neuro, PCCM  Procedures:  ETT 1/5 > 1/8  Significant Diagnostic Tests:  CTH 1/5 > neg for acute infarct or hemorrhage. CTAH 1/5 > no LVO. cEEG 1/5 > diffuse encephalopathy, no seizures MRI brain 1/5 > meg  Micro Data:  COVID 1/5 > neg. Flu 1/5 > neg. Blood 1/5 > neg Sputum 1/7 > neg  Antimicrobials:  Vanc1/5 >  1/8 Ceftriaxone 1/5 > Ampicillin 1/5 > 1/8  Acyclovir 1/5 > 1/8  Interim history/subjective:  No acute events overnight. On Precedex this AM,  hopeful to be able to wean down today.  Objective:  Blood pressure (!) 157/86, pulse 62, temperature (!) 97 F (36.1 C), temperature source Axillary, resp. rate 12, height 5\' 1"  (1.549 m), weight 48.9 kg, SpO2 98 %.        Intake/Output Summary (Last 24 hours) at 11/30/2020 0855 Last data filed at 11/30/2020 0800 Gross per 24 hour  Intake 1350.99 ml  Output 1200 ml  Net 150.99 ml   Filed Weights   11/28/20 0435 11/29/20 0409 11/30/20 0436  Weight: 48.2 kg 49.1 kg 48.9 kg    Examination: General: Chronically ill-appearing, elderly woman, no acute distress Neuro: Somnolent, follows commands with delay. Alert and oriented to person, place. Recognizes husband. HEENT: Normocephalic, atraumatic. Pupils equal, reactive. Cardiovascular: Regular rate, rhythm. No murmurs, rubs, gallops. Lungs: Diminished breath sounds bilaterally. No wheezing. Some scattered rhonchi Abdomen: Soft, non-tender, nondistended. Normoactive bowel sounds. Musculoskeletal: No pitting edema bilaterally. Thin muscles throughout. Skin: Intact, warm, no rashes.  Assessment & Plan:  Kristine Francis 80yo female with previous CVA, hypertension, hyperlipidemia, no known history of seizures admitted 1/5 with acute metabolic encephalopathy with GTC seizure, PEA arrest, and aspiration event occurring in ED.  #Acute metabolic encephalopathy #Delirium #Seizures Most likely initially encephalopathy d/t seizure activity, thought now to be more delirious d/t hospitalization. Continues to require supplemental oxygen but otherwise stable.   Will continue with AED per neurology. Weaning down precedex this AM, start seroquel s/p Cortrak placement. - C/w ceftriaxone - Keppra 500mg  BID per neurology - Start seroquel 50mg  BID, clonidine 0.1mg  BID - Wean down Precedex - Cortrak this AM  #Acute hypoxic respiratory failure #Aspiration pneumonia Clinical picture most consistent with seizure-induced  aspiration event leading to aspiration  pneumonia. Patient deconditioned right now, making it difficult to clear sputum. Hopeful if mental status improves can get chest physiotherapy and PT/OT to assist with strength. Continues to be on 6L [place], wean as tolerated. - C/w ceftriaxone - Wean supplemental oxygen as tolerated - Chest PT pending mental status - PT/OT pending mental status  #Atrial fibrillation Patient in Afib overnight, HR 50-60's. No known hx, not on Afib medications at home. CHADS-VASc 6. Will start anticoagulation. - Start eliquis 5mg  BID   #PEA arrest In ED patient had PEA arrest w/ ROSC after 1 round CPR. Trop 22. TTE w/o significant abnormalities.  - C/w home ASA.  #Hypertension BP elevated to systolic 010'U intermittently. Adding PRN medication for now, can transition to oral once she is able to wean off Precedex. No antihypertensives listed under home medications. - IV hydralazine 10mg  q4 PRN  #Hyperglycemia CBG's appropriate after being given steroids in ED. A1c 5.5. - CBG q4h - SSI  Best Practice (evaluated daily):  Diet: NPO. Cortrak to be placed today. Pain/Anxiety/Delirium protocol (if indicated): n/a VAP protocol (if indicated): In place. DVT prophylaxis: SCD's / Heparin. GI prophylaxis: PPI Glucose control: SSI Mobility: Bedrest Disposition: ICU  Goals of Care:  Last date of multidisciplinary goals of care discussion: Husband updated 1/9 AM, GOC not discussed Family and staff present: n/a Summary of discussion: n/a Follow up goals of care discussion due: 12/01/20. Code Status: FULL  Labs   CBC: Recent Labs  Lab 11/25/20 1439 11/25/20 1451 11/26/20 0507 11/26/20 1041 11/27/20 0614 11/28/20 0338 11/29/20 0641  WBC 8.8  --  16.0*  --  15.9* 15.0* 14.6*  NEUTROABS 6.1  --   --   --  14.1*  --   --   HGB 13.9   < > 12.8 11.6* 11.2* 11.8* 11.0*  HCT 42.3   < > 37.9 34.0* 34.6* 36.2 33.3*  MCV 92.2  --  92.9  --  92.3 92.6 92.2  PLT 277  --  263  --  219 187 192   < > = values in  this interval not displayed.   Basic Metabolic Panel: Recent Labs  Lab 11/25/20 1439 11/25/20 1451 11/26/20 0444 11/26/20 0507 11/26/20 1041 11/27/20 0614 11/28/20 0338 11/29/20 0641 11/30/20 0125  NA 137 138   < > 139 139 136 138 141  --   K 3.6 3.5   < > 3.8 3.3* 3.0* 5.1 3.6 4.2  CL 100 99  --  105  --  105 108 108  --   CO2 24  --   --  20*  --  20* 21* 18*  --   GLUCOSE 172* 170*  --  124*  --  130* 156* 101*  --   BUN 16 18  --  16  --  16 19 18   --   CREATININE 0.89 0.70  --  0.78  --  1.01* 0.76 0.68  --   CALCIUM 9.5  --   --  8.4*  --  8.0* 8.8* 8.8*  --   MG  --   --   --  1.6*  --  2.1 2.2  --  1.7  PHOS  --   --   --  3.5  --   --  2.7  --  2.7   < > = values in this interval not displayed.   GFR: Estimated Creatinine Clearance: 43.7 mL/min (by C-G formula based on SCr of 0.68  mg/dL). Recent Labs  Lab 11/26/20 0507 11/27/20 0614 11/28/20 0338 11/29/20 0641  WBC 16.0* 15.9* 15.0* 14.6*   Liver Function Tests: Recent Labs  Lab 11/25/20 1439 11/28/20 0338  AST 22 30  ALT 15 19  ALKPHOS 63 54  BILITOT 1.8* 0.9  PROT 6.8 5.7*  ALBUMIN 3.8 2.5*   No results for input(s): LIPASE, AMYLASE in the last 168 hours. No results for input(s): AMMONIA in the last 168 hours. ABG    Component Value Date/Time   PHART 7.330 (L) 11/26/2020 1041   PCO2ART 45.3 11/26/2020 1041   PO2ART 56 (L) 11/26/2020 1041   HCO3 23.9 11/26/2020 1041   TCO2 25 11/26/2020 1041   ACIDBASEDEF 2.0 11/26/2020 1041   O2SAT 86.0 11/26/2020 1041    Coagulation Profile: Recent Labs  Lab 11/25/20 1439  INR 1.0   Cardiac Enzymes: No results for input(s): CKTOTAL, CKMB, CKMBINDEX, TROPONINI in the last 168 hours. HbA1C: Hgb A1c MFr Bld  Date/Time Value Ref Range Status  11/25/2020 08:10 PM 5.5 4.8 - 5.6 % Final    Comment:    (NOTE) Pre diabetes:          5.7%-6.4%  Diabetes:              >6.4%  Glycemic control for   <7.0% adults with diabetes   08/24/2015 03:36 PM 6.0  4.6 - 6.5 % Final    Comment:    Glycemic Control Guidelines for People with Diabetes:Non Diabetic:  <6%Goal of Therapy: <7%Additional Action Suggested:  >8%    CBG: Recent Labs  Lab 11/29/20 1523 11/29/20 1952 11/29/20 2316 11/30/20 0316 11/30/20 0803  GLUCAP 118* 134* 124* 121* 82    Critical Care Time: 35 minutes  Sanjuan Dame, MD Internal Medicine PGY-1 Pager: [phone]

## 2020-11-30 NOTE — Progress Notes (Signed)
eLink Physician-Brief Progress Note Patient Name: Kristine Francis DOB: 11/09/1941 MRN: [id number]   Date of Service  11/30/2020  HPI/Events of Note  Fever to 101.9 F - Nursing request for Tylenol. AST and ALT both normal.   eICU Interventions  Plan: 1. Tylenol liquid 650 mg per tube Q 6 hours PRN Temp > 101.0 F. 2. Blood cultures X 2 now. 3. UA with reflex microscopic now.      Intervention Category Major Interventions: Other:  Lysle Dingwall 11/30/2020, 8:58 PM

## 2020-11-30 NOTE — Discharge Instructions (Signed)

## 2020-11-30 NOTE — Progress Notes (Addendum)
eLink Physician-Brief Progress Note Patient Name: Kristine Francis DOB: [birth year] MRN: [id number]   Date of Service  11/30/2020  HPI/Events of Note  EKG : a fib slow HR 58. Labs are pending.  ECHO from 7 th EF normal. No RVD. On sq heparin as VTE prophylaxis   eICU Interventions  Get a d dimer level stat.  Follow labs     Intervention Category Intermediate Interventions: Diagnostic test evaluation  Elmer Sow 11/30/2020, 1:09 AM   6 AM Camera f/u: HR 56. MAP is good. Elevated d dimer and low Mag. Had brady 24 hr ago, not a fib. - Mag 2 gm IV ordered. Discussed with bed side RN. No AC now. Consider Cardiology consult in AM.

## 2020-11-30 NOTE — Progress Notes (Signed)
Nutrition Follow-up  DOCUMENTATION CODES:   Non-severe (moderate) malnutrition in context of chronic illness  INTERVENTION:   Initiate tube feeds via Cortrak: - Osmolite 1.2 @ 55 ml/hr (1320 ml/day)  Tube feeding regimen provides 1584 kcal, 73 grams of protein, and 1082 ml of H2O.   - Continue MVI with minerals daily per tube  NUTRITION DIAGNOSIS:   Moderate Malnutrition related to chronic illness (asthma and emphysema) as evidenced by moderate muscle depletion,mild fat depletion,mild muscle depletion.  Ongoing  GOAL:   Patient will meet greater than or equal to 90% of their needs  Met via TF  MONITOR:   Diet advancement,Labs,Weight trends,TF tolerance  REASON FOR ASSESSMENT:   Consult Enteral/tube feeding initiation and management  ASSESSMENT:   80 y.o. female who has PMH of CVA in 2018, HTN, HLD, iron-deficiency anemia, asthma, emphysema, generalized gait abnormality d/t balance problem. Presented to [facility] ED with AMS and probably seizures complicated by aspiration PNA. She had roughly 2 minutes of CPR prior to intubation.  1/08 - extubated  Discussed pt with RN and during ICU rounds. Pt failed swallow evaluation this morning. Cortrak placed, tip gastric per Cortrak team. RD consulted for tube feeding initiation and management.  Spoke with pt's husband at bedside. All questions answered.  Admit weight: 40 kg Current weight: 48.9 kg  Medications reviewed and include: SSI q 4 hours, MVI with minerals daily, IV keppra IVF: NS @ 20 ml/hr  Labs reviewed. CBG's: 82-134 x 24 hours  UOP: 1100 ml x 24 hours I/O's: +7.3 L since admit  Diet Order:   Diet Order            Diet NPO time specified  Diet effective now                 EDUCATION NEEDS:   Not appropriate for education at this time  Skin:  Skin Assessment: Reviewed RN Assessment  Last BM:  11/26/20  Height:   Ht Readings from Last 1 Encounters:  11/25/20 '5\' 1"'  (1.549 m)    Weight:   Wt  Readings from Last 1 Encounters:  11/30/20 48.9 kg    Ideal Body Weight:  47.7 kg  BMI:  Body mass index is 20.37 kg/m.  Estimated Nutritional Needs:   Kcal:  1450-1650  Protein:  65-75 grams  Fluid:  1.4-1.6 L    Gustavus Bryant, MS, RD, LDN Inpatient Clinical Dietitian Please see AMiON for contact information.

## 2020-11-30 NOTE — Progress Notes (Addendum)
eLink Physician-Brief Progress Note Patient Name: Kristine Francis DOB: [birth year] MRN: [id number]   Date of Service  11/30/2020  HPI/Events of Note  RN reporting pt is in AFIB and does not know if it is new Discussed. BP is stable.  Brady arrest/Sz, s/p extubation status. Earlier K 3.5  eICU Interventions  - get EKG/K-mag-po4-cal level. Call back if abnormal.      Intervention Category Intermediate Interventions: Arrhythmia - evaluation and management  Elmer Sow 11/30/2020, 12:25 AM

## 2020-11-30 NOTE — Evaluation (Signed)
Clinical/Bedside Swallow Evaluation Patient Details  Name: Kristine Francis MRN: [id number] Date of Birth: 1941-09-19  Today's Date: 11/30/2020 Time: SLP Start Time (ACUTE ONLY): 73 SLP Stop Time (ACUTE ONLY): 1015 SLP Time Calculation (min) (ACUTE ONLY): 40 min  Past Medical History:  Past Medical History:  Diagnosis Date  . Allergy   . Asthma    sees Dr. Tiajuana Amass - as child per pt  . At high risk for falls    unstable gait   . Ataxia   . Cataract    removed bilaterally   . Diverticulosis   . Emphysema   . Gait abnormality 05/14/2013   Kristine Francis a patient of Dr. Linda Hedges presented first interpreter thousand 13 with a gait dysfunction, she also had an episodic confusion and Loss device. Exam found a mild nystagmus and she was referred to ophthalmology on 03-2812, brain MRI was normal nystagmus was a secondary diagnosis to vestibulitis, ear nose and throat has followed and had seen the patient. At the time of the nystagmus the patie  . GERD (gastroesophageal reflux disease)   . Hepatic steatosis   . Hyperlipidemia    on atorvastatin   . Hypertension    on losartan   . Iron deficiency anemia, unspecified   . Kidney cysts 11/16/12   small cyst on left  . Nystagmus, end-position   . Osteopenia   . Renal cyst   . Stroke ([place]) 01/2017   mild    Past Surgical History:  Past Surgical History:  Procedure Laterality Date  . ANAL RECTAL MANOMETRY N/A 12/20/2017   Procedure: ANO RECTAL MANOMETRY;  Surgeon: Mauri Pole, MD;  Location: [facility];  Service: Endoscopy;  Laterality: N/A;  . BREAST BIOPSY Bilateral 1970's  . CATARACT EXTRACTION Bilateral LT:02/14/13,RT:02/21/13  . CERVICAL SPINE SURGERY  2010  . COLONOSCOPY  03-12-14   per Dr. Olevia Perches, diverticulosis only, repeat 5 yrs   . EYE SURGERY Bilateral 01/2013&02/2013   left then right  . HERNIA REPAIR  Oct '14-left, remote-right   years ago right; left done '14  . SKIN CANCER EXCISION  05/13/13   FACE, basal cell  .  TONSILLECTOMY     HPI:  80yo female admitted 11/25/20 after becoming unresponsive. [name], unable to use RUE. Intubated in ED. 2 minutes of CPR. PMH: CVA (2018), HTN, HLD, gait abnormality, anemia, emphysema, asthma, GERD, diverticulosis, renal cyst, osteopenia. ETT 1/5-8/22   Assessment / Plan / Recommendation Clinical Impression  Pt presents with high risk for aspiration based on bedside examination. CN exam is unremarkable, except voice quality, which is quite hoarse. Pt oral caviety was significantly dry with dried secretions on palate. RN reports oral care had been completed a couple hours ago, however, pt tends to breathe with her mouth open. Pt calls out repeatedly for her husband despite encouragement that he will return soon. After oral care, pt accepted trials of ice chips, thin liquid, and puree textures. Multiple audible swallows were observed with each bolus. Delayed cough response was noted after each texture. Given mentation, multiple swallows, and suspected airway compromise, recommend continuing with NPO status at this time. SLP will continue to follow to assess readiness for po intake and/or instrumental study.   SLP Visit Diagnosis: Dysphagia, unspecified (R13.10)    Aspiration Risk  Severe aspiration risk;Risk for inadequate nutrition/hydration    Diet Recommendation NPO   Medication Administration: Via alternative means    Other  Recommendations Oral Care Recommendations: Oral care QID Other Recommendations: Have oral  suction available   Follow up Recommendations Other (comment) (TBD)      Frequency and Duration min 1 x/week  1 week;2 weeks       Prognosis Prognosis for Safe Diet Advancement: Good      Swallow Study   General Date of Onset: 11/25/20 HPI: 80yo female admitted 11/25/20 after becoming unresponsive. [name], unable to use RUE. Intubated in ED. 2 minutes of CPR. PMH: CVA (2018), HTN, HLD, gait abnormality, anemia, emphysema, asthma, GERD,  diverticulosis, renal cyst, osteopenia. ETT 1/5-8/22 Type of Study: Bedside Swallow Evaluation Previous Swallow Assessment: none Diet Prior to this Study: NPO Temperature Spikes Noted: No Respiratory Status: Nasal cannula History of Recent Intubation: Yes Length of Intubations (days): 3 days Date extubated: 11/28/20 Behavior/Cognition: Alert;Cooperative;Requires cueing;Distractible Oral Cavity Assessment: Dry;Dried secretions Oral Care Completed by SLP: Yes Oral Cavity - Dentition: Adequate natural dentition Vision: Functional for self-feeding Self-Feeding Abilities: Total assist Patient Positioning: Upright in bed Baseline Vocal Quality: Hoarse Volitional Cough: Weak Volitional Swallow: Able to elicit    Oral/Motor/Sensory Function Overall Oral Motor/Sensory Function: Within functional limits   Ice Chips Ice chips: Impaired Presentation: Spoon Pharyngeal Phase Impairments: Throat Clearing - Immediate;Suspected delayed Swallow;Multiple swallows (audible swallow)   Thin Liquid Thin Liquid: Impaired Presentation: Straw Oral Phase Functional Implications: Oral holding Pharyngeal  Phase Impairments: Suspected delayed Swallow;Cough - Immediate;Multiple swallows (audible swallows)    Puree Puree: Impaired Oral Phase Impairments: Poor awareness of bolus Oral Phase Functional Implications: Prolonged oral transit;Oral holding Pharyngeal Phase Impairments: Suspected delayed Swallow;Multiple swallows;Cough - Delayed (audible swallows)   Solid           Kristine Francis, [facility], [place] Speech Language Pathologist Office: [phone] Pager: [phone]  Shonna Chock 11/30/2020,10:27 AM

## 2020-12-01 DIAGNOSIS — G9341 Metabolic encephalopathy: Secondary | ICD-10-CM | POA: Diagnosis not present

## 2020-12-01 LAB — RENAL FUNCTION PANEL
Albumin: 2.6 g/dL — ABNORMAL LOW (ref 3.5–5.0)
Anion gap: 9 (ref 5–15)
BUN: 14 mg/dL (ref 8–23)
CO2: 27 mmol/L (ref 22–32)
Calcium: 8.6 mg/dL — ABNORMAL LOW (ref 8.9–10.3)
Chloride: 106 mmol/L (ref 98–111)
Creatinine, Ser: 0.73 mg/dL (ref 0.44–1.00)
GFR, Estimated: >60 mL/min (ref >60)
Glucose, Bld: 257 mg/dL — ABNORMAL HIGH (ref 70–99)
Phosphorus: 1.8 mg/dL — ABNORMAL LOW (ref 2.5–4.6)
Potassium: 3.1 mmol/L — ABNORMAL LOW (ref 3.5–5.1)
Sodium: 142 mmol/L (ref 135–145)

## 2020-12-01 LAB — CBC
HCT: 35.6 % — ABNORMAL LOW (ref 36.0–46.0)
Hemoglobin: 11.8 g/dL — ABNORMAL LOW (ref 12.0–15.0)
MCH: 29.9 pg (ref 26.0–34.0)
MCHC: 33.1 g/dL (ref 30.0–36.0)
MCV: 90.4 fL (ref 80.0–100.0)
Platelets: 228 10*3/uL (ref 150–400)
RBC: 3.94 MIL/uL (ref 3.87–5.11)
RDW: 14.2 % (ref 11.5–15.5)
WBC: 13.3 10*3/uL — ABNORMAL HIGH (ref 4.0–10.5)
nRBC: 0.2 % (ref 0.0–0.2)

## 2020-12-01 LAB — CALCIUM, IONIZED: Calcium, Ionized, Serum: 4.8 mg/dL (ref 4.5–5.6)

## 2020-12-01 LAB — GLUCOSE, CAPILLARY
Glucose-Capillary: 203 mg/dL — ABNORMAL HIGH (ref 70–99)
Glucose-Capillary: 213 mg/dL — ABNORMAL HIGH (ref 70–99)
Glucose-Capillary: 189 mg/dL — ABNORMAL HIGH (ref 70–99)
Glucose-Capillary: 81 mg/dL (ref 70–99)
Glucose-Capillary: 168 mg/dL — ABNORMAL HIGH (ref 70–99)
Glucose-Capillary: 137 mg/dL — ABNORMAL HIGH (ref 70–99)

## 2020-12-01 MED ORDER — POTASSIUM PHOSPHATES 15 MMOLE/5ML IV SOLN
30.0000 mmol | Freq: Once | INTRAVENOUS | Status: AC
  Administered 2020-12-01: 30 mmol via INTRAVENOUS
  Filled 2020-12-01: qty 10

## 2020-12-01 MED ORDER — POTASSIUM CHLORIDE 10 MEQ/100ML IV SOLN
10.0000 meq | INTRAVENOUS | Status: AC
  Administered 2020-12-01 (×4): 10 meq via INTRAVENOUS
  Filled 2020-12-01: qty 100

## 2020-12-01 NOTE — Care Management (Signed)
Benefit check sent for Eliquis 

## 2020-12-01 NOTE — Progress Notes (Signed)
  Speech Language Pathology Treatment: Dysphagia  Patient Details Name: Kristine Francis MRN: [id number] DOB: Nov 07, 1941 Today's Date: 12/01/2020 Time: 1015-1030 SLP Time Calculation (min) (ACUTE ONLY): 15 min  Assessment / Plan / Recommendation Clinical Impression  Pt was seen at bedside to assess readiness for PO intake and/or instrumental study. Pt's husband was present during this session. Oral cavity was noted to be significantly dry. Oral care completed with suction, following which pt requested water. SLP provided individual ice chips via teaspoon. Adequate oral prep was observed. Suspect delayed swallow reflex. Multiple audible swallows were noted, with immediate throat clearing and weak cough response. Pt now has cortrak in place. Recommend continuing NPO status with regular thorough oral care. SLP will continue to follow to assess readiness for po intake or completion of instrumental study.    HPI HPI: Kristine Francis admitted 11/25/20 after becoming unresponsive. [name], unable to use RUE. Intubated in ED. 2 minutes of CPR. PMH: CVA (2018), HTN, HLD, gait abnormality, anemia, emphysema, asthma, GERD, diverticulosis, renal cyst, osteopenia. ETT 1/5-8/22      SLP Plan  Continue with current plan of care       Recommendations  Diet recommendations: NPO Medication Administration: Via alternative means                Oral Care Recommendations: Oral care QID Follow up Recommendations: Other (comment) (TBD) SLP Visit Diagnosis: Dysphagia, unspecified (R13.10) Plan: Continue with current plan of care       [place]. Quentin Ore, [facility], [place] Speech Language Pathologist Office: [phone] Pager: [phone]  Shonna Chock 12/01/2020, 10:33 AM

## 2020-12-01 NOTE — Progress Notes (Signed)
[facility] ADULT ICU REPLACEMENT PROTOCOL   The patient does apply for the [facility] Adult ICU Electrolyte Replacment Protocol based on the criteria listed below:   1. Is GFR >/= 30 ml/min? Yes.    Patient's GFR today is >60 2. Is SCr </= 2? Yes.   Patient's SCr is 0.73 ml/kg/hr 3. Did SCr increase >/= 0.5 in 24 hours? No. 4. Abnormal electrolyte(s):  K 3.1, Phos 1.8 5. Ordered repletion with: per protocol 6. If a panic level lab has been reported, has the CCM MD in charge been notified? Yes.  .   Physician:  Rosie Fate R Aralyn Nowak 12/01/2020 5:49 AM

## 2020-12-01 NOTE — Progress Notes (Addendum)
PROGRESS NOTE    Kristine Francis  [id number]  DOB: [birth year]  DOA: 11/25/2020 PCP: [name], MD Outpatient Specialists:   Hospital course: 80 year old female with COPD, CVA, HTN was admitted 11/25/2020 with PEA arrest most likely secondary to aspiration event, complicated by grand mal seizure.  Patient was admitted to the ICU with intubation however she was quickly extubated however was noticed to have altered mental status.  EEG and head CT were negative.     Subjective:  She is somnolent however arousable by voice alone when her husband calls her name.  She turns her head to look at me and is able to say that she is at [facility] and that she is not confused.  She states she is very tired.  She does not know how long she has been here.  She knows that her husband is at bedside.  She has no acute complaints.  Denies abdominal pain or shortness of breath.   Objective: Vitals:   12/01/20 0900 12/01/20 1000 12/01/20 1100 12/01/20 1200  BP: 130/65 (!) 142/73 120/71   Pulse: 88 97 89   Resp: (!) 21 (!) 23 (!) 25   Temp:    97.8 F (36.6 C)  TempSrc:    Oral  SpO2: 99% 95% 100% 100%  Weight:      Height:        Intake/Output Summary (Last 24 hours) at 12/01/2020 1342 Last data filed at 12/01/2020 1100 Gross per 24 hour  Intake 2052.05 ml  Output 300 ml  Net 1752.05 ml   Filed Weights   11/29/20 0409 11/30/20 0436 12/01/20 0456  Weight: 49.1 kg 48.9 kg 49.2 kg     Exam:  General: Frail elderly female lying in bed with attentive husband at bedside. CVS: S1-S2, regular  Respiratory:  decreased air entry bilaterally secondary to decreased inspiratory effort, rales at bases  GI: Does have bowel sounds although they are hypoactive normal pitch and tone, abdomen is slightly distended firm but not hard.  There is no tenderness to light or deep palpation.  No rebound tenderness LE: No edema.  Neuro: Metabolic encephalopathy   Assessment & Plan:   80 year old  female with history of CVA, HTN and COPD was admitted 11/25/2020 status post PEA arrest complicated by seizures.  Patient was quickly extubated and has persistent metabolic encephalopathy which seems to be improving.  Acute metabolic encephalopathy complicated by seizure Patient seems to be improving compared to previous notes, she knows where she is and is appropriately interactive if slow.  This may improve when she comes out of the ICU.  Appreciate neurology input She was started on Seroquel 50 twice daily and clonidine 0.1 twice daily once she came off Precedex Can consider decreasing Seroquel once she is out of the unit, this will likely help her somnolence. Continue Keppra 500 twice daily per neurology  Hypokalemia We will give 4 runs of IV KCl and repeat in the morning  Hypophosphatemia K-Phos ordered, will recheck in the morning  Acute hypoxic respiratory failure Thought to be secondary to aspiration status post seizure Continue ceftriaxone Oxygen as needed  Atrial fibrillation Developed atrial fibrillation yesterday, no previous history Patient is rate control for rate control medication CHA2DS2-VASc 2 score is 6 Eliquis 5 twice daily started in ICU  Nutrition Cortrak in place. Appreciate nutritional consultation  HTN Is not on any antihypertensives at home Is on IV hydralazine as needed  Status post PEA arrest ROSC after 1 round of  CPR Echocardiogram within normal, peak troponin 22 Patient was placed on aspirin in ICU   DVT prophylaxis: SCD Code Status: Full Family Communication: Husband was at bedside throughout Disposition Plan:   Patient is from: Home  Anticipated Discharge Location: TBD  Barriers to Discharge: Acute metabolic encephalopathy  Is patient medically stable for Discharge: No   Consultants:  PCCM  Neurology  Procedures:  Patient was intubated on 5 through 11/28/2020  Antimicrobials:  Ceftriaxone   Data Reviewed:  Basic Metabolic  Panel: Recent Labs  Lab 11/26/20 0507 11/26/20 1041 11/27/20 0614 11/28/20 0338 11/29/20 0641 11/30/20 0125 11/30/20 1208 12/01/20 0025  NA 139   < > 136 138 141  --  138 142  K 3.8   < > 3.0* 5.1 3.6 4.2 4.9 3.1*  CL 105  --  105 108 108  --  100 106  CO2 20*  --  20* 21* 18*  --  19* 27  GLUCOSE 124*  --  130* 156* 101*  --  92 257*  BUN 16  --  16 19 18   --  15 14  CREATININE 0.78  --  1.01* 0.76 0.68  --  0.54 0.73  CALCIUM 8.4*  --  8.0* 8.8* 8.8*  --  8.6* 8.6*  MG 1.6*  --  2.1 2.2  --  1.7  --   --   PHOS 3.5  --   --  2.7  --  2.7  --  1.8*   < > = values in this interval not displayed.   Liver Function Tests: Recent Labs  Lab 11/25/20 1439 11/28/20 0338 12/01/20 0025  AST 22 30  --   ALT 15 19  --   ALKPHOS 63 54  --   BILITOT 1.8* 0.9  --   PROT 6.8 5.7*  --   ALBUMIN 3.8 2.5* 2.6*   No results for input(s): LIPASE, AMYLASE in the last 168 hours. No results for input(s): AMMONIA in the last 168 hours. CBC: Recent Labs  Lab 11/25/20 1439 11/25/20 1451 11/26/20 0507 11/26/20 1041 11/27/20 0614 11/28/20 0338 11/29/20 0641 12/01/20 0025  WBC 8.8  --  16.0*  --  15.9* 15.0* 14.6* 13.3*  NEUTROABS 6.1  --   --   --  14.1*  --   --   --   HGB 13.9   < > 12.8 11.6* 11.2* 11.8* 11.0* 11.8*  HCT 42.3   < > 37.9 34.0* 34.6* 36.2 33.3* 35.6*  MCV 92.2  --  92.9  --  92.3 92.6 92.2 90.4  PLT 277  --  263  --  219 187 192 228   < > = values in this interval not displayed.   Cardiac Enzymes: No results for input(s): CKTOTAL, CKMB, CKMBINDEX, TROPONINI in the last 168 hours. BNP (last 3 results) No results for input(s): PROBNP in the last 8760 hours. CBG: Recent Labs  Lab 11/30/20 1923 11/30/20 2319 12/01/20 0318 12/01/20 0722 12/01/20 1152  GLUCAP 167* 230* 203* 213* 189*    Recent Results (from the past 240 hour(s))  Resp panel by RT-PCR (RSV, Flu A&B, Covid) Nasopharyngeal Swab     Status: None   Collection Time: 11/25/20  2:59 PM   Specimen:  Nasopharyngeal Swab; Nasopharyngeal(NP) swabs in vial transport medium  Result Value Ref Range Status   SARS Coronavirus 2 by RT PCR NEGATIVE NEGATIVE Final    Comment: (NOTE) SARS-CoV-2 target nucleic acids are NOT DETECTED.  The SARS-CoV-2 RNA is  generally detectable in upper respiratory specimens during the acute phase of infection. The lowest concentration of SARS-CoV-2 viral copies this assay can detect is 138 copies/mL. A negative result does not preclude SARS-Cov-2 infection and should not be used as the sole basis for treatment or other patient management decisions. A negative result may occur with  improper specimen collection/handling, submission of specimen other than nasopharyngeal swab, presence of viral mutation(s) within the areas targeted by this assay, and inadequate number of viral copies(<138 copies/mL). A negative result must be combined with clinical observations, patient history, and epidemiological information. The expected result is Negative.  Fact Sheet for Patients:  EntrepreneurPulse.com.au  Fact Sheet for Healthcare Providers:  IncredibleEmployment.be  This test is no t yet approved or cleared by the [place] FDA and  has been authorized for detection and/or diagnosis of SARS-CoV-2 by FDA under an Emergency Use Authorization (EUA). This EUA will remain  in effect (meaning this test can be used) for the duration of the COVID-19 declaration under Section 564(b)(1) of the Act, 21 U.S.C.section 360bbb-3(b)(1), unless the authorization is terminated  or revoked sooner.       Influenza A by PCR NEGATIVE NEGATIVE Final   Influenza B by PCR NEGATIVE NEGATIVE Final    Comment: (NOTE) The Xpert Xpress SARS-CoV-2/FLU/RSV plus assay is intended as an aid in the diagnosis of influenza from Nasopharyngeal swab specimens and should not be used as a sole basis for treatment. Nasal washings and aspirates are unacceptable for  Xpert Xpress SARS-CoV-2/FLU/RSV testing.  Fact Sheet for Patients: EntrepreneurPulse.com.au  Fact Sheet for Healthcare Providers: IncredibleEmployment.be  This test is not yet approved or cleared by the [place] FDA and has been authorized for detection and/or diagnosis of SARS-CoV-2 by FDA under an Emergency Use Authorization (EUA). This EUA will remain in effect (meaning this test can be used) for the duration of the COVID-19 declaration under Section 564(b)(1) of the Act, 21 U.S.C. section 360bbb-3(b)(1), unless the authorization is terminated or revoked.     Resp Syncytial Virus by PCR NEGATIVE NEGATIVE Final    Comment: (NOTE) Fact Sheet for Patients: EntrepreneurPulse.com.au  Fact Sheet for Healthcare Providers: IncredibleEmployment.be  This test is not yet approved or cleared by the [place] FDA and has been authorized for detection and/or diagnosis of SARS-CoV-2 by FDA under an Emergency Use Authorization (EUA). This EUA will remain in effect (meaning this test can be used) for the duration of the COVID-19 declaration under Section 564(b)(1) of the Act, 21 U.S.C. section 360bbb-3(b)(1), unless the authorization is terminated or revoked.  Performed at [facility], [place] [address] [place], [place] [address]   Culture, blood (routine x 2)     Status: None   Collection Time: 11/25/20  8:10 PM   Specimen: BLOOD  Result Value Ref Range Status   Specimen Description BLOOD RIGHT ANTECUBITAL  Final   Special Requests   Final    BOTTLES DRAWN AEROBIC AND ANAEROBIC Blood Culture results may not be optimal due to an inadequate volume of blood received in culture bottles   Culture   Final    NO GROWTH 5 DAYS Performed at [facility], [place] [address] [place], [place] [address]    Report Status 11/30/2020 FINAL  Final  Culture, blood (routine x 2)     Status: None   Collection  Time: 11/25/20  8:10 PM   Specimen: BLOOD RIGHT FOREARM  Result Value Ref Range Status   Specimen Description BLOOD RIGHT  FOREARM  Final   Special Requests   Final    BOTTLES DRAWN AEROBIC AND ANAEROBIC Blood Culture adequate volume   Culture   Final    NO GROWTH 5 DAYS Performed at [facility], [address], [place], [place] [address]    Report Status 11/30/2020 FINAL  Final  MRSA PCR Screening     Status: None   Collection Time: 11/26/20  9:18 PM   Specimen: Nasal Mucosa; Nasopharyngeal  Result Value Ref Range Status   MRSA by PCR NEGATIVE NEGATIVE Final    Comment:        The GeneXpert MRSA Assay (FDA approved for NASAL specimens only), is one component of a comprehensive MRSA colonization surveillance program. It is not intended to diagnose MRSA infection nor to guide or monitor treatment for MRSA infections. Performed at [facility], [place] [address] [place], [place] [address]   Culture, respiratory (tracheal aspirate)     Status: None   Collection Time: 11/27/20  5:48 PM   Specimen: Tracheal Aspirate; Respiratory  Result Value Ref Range Status   Specimen Description TRACHEAL ASPIRATE  Final   Special Requests NONE  Final   Gram Stain   Final    NO WBC SEEN FEW YEAST Performed at [facility], [place] [address], [place], [place] [address]    Culture MODERATE CANDIDA ALBICANS  Final   Report Status 11/29/2020 FINAL  Final  Culture, blood (routine x 2)     Status: None (Preliminary result)   Collection Time: 11/30/20  9:18 PM   Specimen: BLOOD  Result Value Ref Range Status   Specimen Description BLOOD RIGHT ANTECUBITAL  Final   Special Requests   Final    BOTTLES DRAWN AEROBIC AND ANAEROBIC Blood Culture adequate volume   Culture   Final    NO GROWTH < 12 HOURS Performed at [facility], [place] [address], [place], [place] [address]    Report Status PENDING  Incomplete  Culture, blood (routine x 2)     Status: None (Preliminary result)    Collection Time: 12/01/20 12:25 AM   Specimen: BLOOD  Result Value Ref Range Status   Specimen Description BLOOD RIGHT ANTECUBITAL  Final   Special Requests   Final    BOTTLES DRAWN AEROBIC ONLY Blood Culture adequate volume Performed at [facility], [place] [address] [place], [place] [address]    Culture PENDING  Incomplete   Report Status PENDING  Incomplete      Studies: No results found.   Scheduled Meds: . apixaban  5 mg Per Tube BID  . aspirin  81 mg Per Tube Daily  . atorvastatin  10 mg Per Tube Daily  . chlorhexidine  15 mL Mouth Rinse BID  . Chlorhexidine Gluconate Cloth  6 each Topical Daily  . cloNIDine  0.1 mg Per Tube BID  . insulin aspart  0-9 Units Subcutaneous Q4H  . lidocaine  1 application Other Once  . mouth rinse  15 mL Mouth Rinse q12n4p  . multivitamin with minerals  1 tablet Per Tube Daily  . QUEtiapine  50 mg Per Tube BID   Continuous Infusions: . sodium chloride Stopped (12/01/20 0634)  . feeding supplement (OSMOLITE 1.2 CAL) 1,000 mL (11/30/20 1301)  . levETIRAcetam Stopped (12/01/20 0509)  . potassium chloride      Active Problems:   Seizures (HCC)   Malnutrition of moderate degree   Acute stroke due to ischemia ([place])  Dewaine Oats Derek Jack, [facility]  If 7PM-7AM, please contact night-coverage [email or web address] Password [email or web address] 12/01/2020, 1:42 PM    LOS: 6 days

## 2020-12-01 NOTE — TOC Benefit Eligibility Note (Signed)
Transition of Care [facility]) Benefit Eligibility Note    Patient Details  Name: Kristine Francis MRN: [id number] Date of Birth: Oct 05, 1941   Medication/Dose: Eliquis 5mg . bid for 30 day supply  Covered?: Yes  Tier: 2 Drug  Prescription Coverage [facility]  Spoke with Person/Company/Phone Number:: [employer] PH# [phone]  Co-Pay: $38.00  Prior Approval: No  Deductible: Unmet       Shelda Altes Phone Number: 12/01/2020, 2:08 PM

## 2020-12-02 ENCOUNTER — Inpatient Hospital Stay (HOSPITAL_COMMUNITY): Payer: Medicare Other

## 2020-12-02 DIAGNOSIS — J449 Chronic obstructive pulmonary disease, unspecified: Secondary | ICD-10-CM

## 2020-12-02 DIAGNOSIS — I469 Cardiac arrest, cause unspecified: Secondary | ICD-10-CM | POA: Diagnosis not present

## 2020-12-02 DIAGNOSIS — G40409 Other generalized epilepsy and epileptic syndromes, not intractable, without status epilepticus: Secondary | ICD-10-CM

## 2020-12-02 DIAGNOSIS — R0902 Hypoxemia: Secondary | ICD-10-CM | POA: Diagnosis not present

## 2020-12-02 LAB — BASIC METABOLIC PANEL
Anion gap: 10 (ref 5–15)
BUN: 10 mg/dL (ref 8–23)
CO2: 27 mmol/L (ref 22–32)
Calcium: 8.1 mg/dL — ABNORMAL LOW (ref 8.9–10.3)
Chloride: 103 mmol/L (ref 98–111)
Creatinine, Ser: 0.5 mg/dL (ref 0.44–1.00)
GFR, Estimated: >60 mL/min (ref >60)
Glucose, Bld: 145 mg/dL — ABNORMAL HIGH (ref 70–99)
Potassium: 3.9 mmol/L (ref 3.5–5.1)
Sodium: 140 mmol/L (ref 135–145)

## 2020-12-02 LAB — CBC WITH DIFFERENTIAL/PLATELET
Abs Immature Granulocytes: 0.3 10*3/uL — ABNORMAL HIGH (ref 0.00–0.07)
Basophils Absolute: 0 10*3/uL (ref 0.0–0.1)
Basophils Relative: 0 %
Eosinophils Absolute: 0 10*3/uL (ref 0.0–0.5)
Eosinophils Relative: 0 %
HCT: 32.7 % — ABNORMAL LOW (ref 36.0–46.0)
Hemoglobin: 10.7 g/dL — ABNORMAL LOW (ref 12.0–15.0)
Lymphocytes Relative: 6 %
Lymphs Abs: 0.7 10*3/uL (ref 0.7–4.0)
MCH: 30.2 pg (ref 26.0–34.0)
MCHC: 32.7 g/dL (ref 30.0–36.0)
MCV: 92.4 fL (ref 80.0–100.0)
Metamyelocytes Relative: 1 %
Monocytes Absolute: 0.8 10*3/uL (ref 0.1–1.0)
Monocytes Relative: 7 %
Myelocytes: 1 %
Neutro Abs: 9.2 10*3/uL — ABNORMAL HIGH (ref 1.7–7.7)
Neutrophils Relative %: 84 %
Platelets: 218 10*3/uL (ref 150–400)
Promyelocytes Relative: 1 %
RBC: 3.54 MIL/uL — ABNORMAL LOW (ref 3.87–5.11)
RDW: 14.7 % (ref 11.5–15.5)
WBC: 10.9 10*3/uL — ABNORMAL HIGH (ref 4.0–10.5)
nRBC: 0 % (ref 0.0–0.2)
nRBC: 0 /100 WBC

## 2020-12-02 LAB — GLUCOSE, CAPILLARY
Glucose-Capillary: 128 mg/dL — ABNORMAL HIGH (ref 70–99)
Glucose-Capillary: 150 mg/dL — ABNORMAL HIGH (ref 70–99)
Glucose-Capillary: 155 mg/dL — ABNORMAL HIGH (ref 70–99)
Glucose-Capillary: 130 mg/dL — ABNORMAL HIGH (ref 70–99)
Glucose-Capillary: 136 mg/dL — ABNORMAL HIGH (ref 70–99)

## 2020-12-02 LAB — MAGNESIUM: Magnesium: 1.7 mg/dL (ref 1.7–2.4)

## 2020-12-02 LAB — PHOSPHORUS: Phosphorus: 2.7 mg/dL (ref 2.5–4.6)

## 2020-12-02 MED ORDER — QUETIAPINE FUMARATE 50 MG PO TABS
50.0000 mg | ORAL_TABLET | Freq: Every day | ORAL | Status: DC
  Filled 2020-12-02: qty 1

## 2020-12-02 MED ORDER — LEVETIRACETAM 100 MG/ML PO SOLN
500.0000 mg | Freq: Two times a day (BID) | ORAL | Status: DC
  Administered 2020-12-02 – 2020-12-05 (×6): 500 mg
  Filled 2020-12-02 (×6): qty 5

## 2020-12-02 MED ORDER — LORAZEPAM 0.5 MG PO TABS
0.5000 mg | ORAL_TABLET | Freq: Once | ORAL | Status: AC | PRN
  Administered 2020-12-02: 0.5 mg
  Filled 2020-12-02: qty 1

## 2020-12-02 MED ORDER — FUROSEMIDE 10 MG/ML IJ SOLN
20.0000 mg | Freq: Once | INTRAMUSCULAR | Status: AC
  Administered 2020-12-02: 20 mg via INTRAVENOUS
  Filled 2020-12-02: qty 4

## 2020-12-02 MED ORDER — LEVETIRACETAM 100 MG/ML PO SOLN: 500.0000 mg | Freq: Two times a day (BID) | ORAL | Status: DC

## 2020-12-02 MED ORDER — METOPROLOL TARTRATE 25 MG/10 ML ORAL SUSPENSION
25.0000 mg | Freq: Two times a day (BID) | ORAL | Status: DC
  Administered 2020-12-03 – 2020-12-05 (×5): 25 mg
  Filled 2020-12-02 (×8): qty 10

## 2020-12-02 MED ORDER — METOPROLOL TARTRATE 25 MG/10 ML ORAL SUSPENSION
25.0000 mg | Freq: Two times a day (BID) | ORAL | Status: DC
  Administered 2020-12-02: 25 mg via ORAL
  Filled 2020-12-02: qty 10

## 2020-12-02 NOTE — Evaluation (Signed)
Physical Therapy Evaluation Patient Details Name: Kristine Francis MRN: [id number] DOB: 01/27/1941 Today's Date: 12/02/2020   History of Present Illness  80 year old female with COPD, CVA, HTN was admitted 11/25/2020 with PEA arrest most likely secondary to aspiration event, complicated by grand mal seizure. Pt was intubated on arrival, extubated on 11/28/2020.  Clinical Impression  Pt presents to PT with deficits in functional mobility, gait, balance, power, strength, endurance, cognition. Pt is generally weak but with more significant L sided weakness than R. Pt demonstrates anterior trunk lean with intermittent L lean as well during session, requiring PT correction to prevent potential falls. PT also notes some L beating nystagmus with lateral eye movement to R side, pt denies dizziness or symptoms. Pt will benefit from continued acute PT POC to improve mobility quality and to reduce falls risk. PT recommends CIR admission at the time of discharge as the pt was at a modI level prior to admission and demonstrates the potential to make significant functional gains with high intensity therapies.    Follow Up Recommendations CIR    Equipment Recommendations  Wheelchair (measurements PT);Wheelchair cushion (measurements PT);Hospital bed (if D/C today)    Recommendations for Other Services Rehab consult     Precautions / Restrictions Precautions Precautions: Fall;Other (comment) Precaution Comments: seizure Restrictions Weight Bearing Restrictions: No      Mobility  Bed Mobility Overal bed mobility: Needs Assistance Bed Mobility: Rolling;Supine to Sit;Sit to Supine Rolling: Mod assist   Supine to sit: Max assist Sit to supine: Max assist        Transfers Overall transfer level: Needs assistance Equipment used: 1 person hand held assist Transfers: Sit to/from Stand Sit to Stand: Mod assist;From elevated surface            Ambulation/Gait                Stairs             Wheelchair Mobility    Modified Rankin (Stroke Patients Only)       Balance Overall balance assessment: Needs assistance Sitting-balance support: Single extremity supported;Feet supported Sitting balance-Leahy Scale: Poor Sitting balance - Comments: min-modA due to anterior or L sided lean Postural control: Left lateral lean Standing balance support: Bilateral upper extremity supported Standing balance-Leahy Scale: Poor Standing balance comment: modA due to anterior lean                             Pertinent Vitals/Pain Pain Assessment: No/denies pain    Home Living Family/patient expects to be discharged to:: Private residence Living Arrangements: Spouse/significant other Available Help at Discharge: Family;Available 24 hours/day Type of Home: House Home Access: Stairs to enter Entrance Stairs-Rails: None Entrance Stairs-Number of Steps: 1 Home Layout: One level Home Equipment: Walker - 4 wheels;Grab bars - tub/shower      Prior Function Level of Independence: Independent with assistive device(s)         Comments: pt ambulates with use of Rollator     Hand Dominance        Extremity/Trunk Assessment   Upper Extremity Assessment Upper Extremity Assessment: Generalized weakness (LUE with more significant weakness than RUE)    Lower Extremity Assessment Lower Extremity Assessment: Generalized weakness    Cervical / Trunk Assessment Cervical / Trunk Assessment: Kyphotic  Communication   Communication: No difficulties (soft spoken)  Cognition Arousal/Alertness: Awake/alert Behavior During Therapy: Flat affect Overall Cognitive Status: Impaired/Different from baseline Area of Impairment:  Problem solving                             Problem Solving: Slow processing;Requires verbal cues;Requires tactile cues        General Comments General comments (skin integrity, edema, etc.): VSS, pt with soft BP initially 89/56,  97/52 at end of session. Pt on 2L [place], desats to high 80s on RA. Left on 2L [place] at end of session with SpO2 in high 90s    Exercises     Assessment/Plan    PT Assessment Patient needs continued PT services  PT Problem List Decreased strength;Decreased activity tolerance;Decreased balance;Decreased mobility;Decreased cognition;Decreased knowledge of use of DME;Decreased safety awareness       PT Treatment Interventions DME instruction;Gait training;Functional mobility training;Stair training;Therapeutic exercise;Therapeutic activities;Balance training;Neuromuscular re-education;Cognitive remediation;Patient/family education    PT Goals (Current goals can be found in the Care Plan section)  Acute Rehab PT Goals Patient Stated Goal: to return to independence PT Goal Formulation: With patient/family Time For Goal Achievement: 12/16/20 Potential to Achieve Goals: Good    Frequency Min 3X/week   Barriers to discharge        Co-evaluation               AM-PAC PT "6 Clicks" Mobility  Outcome Measure Help needed turning from your back to your side while in a flat bed without using bedrails?: A Lot Help needed moving from lying on your back to sitting on the side of a flat bed without using bedrails?: A Lot Help needed moving to and from a bed to a chair (including a wheelchair)?: A Lot Help needed standing up from a chair using your arms (e.g., wheelchair or bedside chair)?: A Lot Help needed to walk in hospital room?: A Lot Help needed climbing 3-5 steps with a railing? : Total 6 Click Score: 11    End of Session Equipment Utilized During Treatment: Oxygen Activity Tolerance: Patient tolerated treatment well Patient left: in bed;with call bell/phone within reach;with bed alarm set;with family/visitor present Nurse Communication: Mobility status PT Visit Diagnosis: Other abnormalities of gait and mobility (R26.89);Muscle weakness (generalized) (M62.81);Other symptoms and signs  involving the nervous system (R29.898)    Time: 2025-4270 PT Time Calculation (min) (ACUTE ONLY): 35 min   Charges:   PT Evaluation $PT Eval Moderate Complexity: 1 Mod PT Treatments $Therapeutic Activity: 8-22 mins        Zenaida Niece, PT, DPT Acute Rehabilitation Pager: [phone]   Zenaida Niece 12/02/2020, 2:53 PM

## 2020-12-02 NOTE — Care Management Important Message (Signed)
Important Message  Patient Details  Name: Kristine Francis MRN: [id number] Date of Birth: [birth year]   Medicare Important Message Given:  Yes     [name] [address] 12/02/2020, 1:54 PM

## 2020-12-02 NOTE — Care Management Important Message (Signed)
Important Message  Patient Details  Name: Kristine Francis MRN: [id number] Date of Birth: [birth year]   Medicare Important Message Given:  Yes     Kiyona Mcnall [address] 12/02/2020, 1:53 PM
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
## 2020-12-02 NOTE — Progress Notes (Signed)
Inpatient Rehab Admissions Coordinator Note:   Per therapy recommendations, pt was screened for CIR candidacy by Clemens Catholic, [place] CCC-SLP. MD expresses concern that Pt. Will be unable to tolerate CIR level therapies and recommends SNF at d/c. I will not pursue CIR consult on this Pt.   Please contact me with questions.   Clemens Catholic, [place], [place] Admissions Coordinator  [phone] ([place]) [phone] (office)

## 2020-12-03 DIAGNOSIS — I469 Cardiac arrest, cause unspecified: Secondary | ICD-10-CM | POA: Diagnosis not present

## 2020-12-03 DIAGNOSIS — G40409 Other generalized epilepsy and epileptic syndromes, not intractable, without status epilepticus: Secondary | ICD-10-CM | POA: Diagnosis not present

## 2020-12-03 DIAGNOSIS — R0902 Hypoxemia: Secondary | ICD-10-CM | POA: Diagnosis not present

## 2020-12-03 LAB — GLUCOSE, CAPILLARY
Glucose-Capillary: 137 mg/dL — ABNORMAL HIGH (ref 70–99)
Glucose-Capillary: 147 mg/dL — ABNORMAL HIGH (ref 70–99)
Glucose-Capillary: 152 mg/dL — ABNORMAL HIGH (ref 70–99)
Glucose-Capillary: 169 mg/dL — ABNORMAL HIGH (ref 70–99)
Glucose-Capillary: 180 mg/dL — ABNORMAL HIGH (ref 70–99)
Glucose-Capillary: 92 mg/dL (ref 70–99)
Glucose-Capillary: 93 mg/dL (ref 70–99)

## 2020-12-03 LAB — VITAMIN B12: Vitamin B-12: 777 pg/mL (ref 180–914)

## 2020-12-03 LAB — CBC
HCT: 38.4 % (ref 36.0–46.0)
Hemoglobin: 12.5 g/dL (ref 12.0–15.0)
MCH: 30.1 pg (ref 26.0–34.0)
MCHC: 32.6 g/dL (ref 30.0–36.0)
MCV: 92.5 fL (ref 80.0–100.0)
Platelets: 276 10*3/uL (ref 150–400)
RBC: 4.15 MIL/uL (ref 3.87–5.11)
RDW: 14.6 % (ref 11.5–15.5)
WBC: 13.4 10*3/uL — ABNORMAL HIGH (ref 4.0–10.5)
nRBC: 0 % (ref 0.0–0.2)

## 2020-12-03 MED ORDER — LORAZEPAM 0.5 MG PO TABS
0.5000 mg | ORAL_TABLET | Freq: Once | ORAL | Status: AC | PRN
  Administered 2020-12-04: 0.5 mg
  Filled 2020-12-03: qty 1

## 2020-12-03 NOTE — Progress Notes (Signed)
  Speech Language Pathology Treatment: Dysphagia  Patient Details Name: Kristine Francis MRN: [id number] DOB: September 06, 1941 Today's Date: 12/03/2020 Time: [id number] SLP Time Calculation (min) (ACUTE ONLY): 18 min  Assessment / Plan / Recommendation Clinical Impression  Pt seen for skilled treatment with intake of ice chips post-oral care with multiple swallows, audible swallow, delay in the initiation of the swallow and immediate/delayed cough noted post-swallow.  Pt has improved from initial assessment, but continues to exhibit overt s/s of aspiration during brief trial of POs.  Discussed implications of intubation affecting swallow function/efficiency paired with pharyngeal weakness and hoarse vocal quality. Pt has Cortrak in place for nutrition/hydration at this time.  ST will continue to assess PO readiness and potential for objective assessment to progress to oral intake vs TF while in acute setting.  HPI HPI: 80yo female admitted 11/25/20 after becoming unresponsive. [name], unable to use RUE. Intubated in ED. 2 minutes of CPR. PMH: CVA (2018), HTN, HLD, gait abnormality, anemia, emphysema, asthma, GERD, diverticulosis, renal cyst, osteopenia. ETT 11/25/20-11/28/20.      SLP Plan  Continue with current plan of care       Recommendations  Diet recommendations: NPO Medication Administration: Via alternative means                Oral Care Recommendations: Oral care QID Follow up Recommendations: Other (comment) (TBD) SLP Visit Diagnosis: Dysphagia, oropharyngeal phase (R13.12);Dysphagia, unspecified (R13.10) Plan: Continue with current plan of care                       Elvina Sidle, M.S., CCC-SLP 12/03/2020, 1:57 PM

## 2020-12-03 NOTE — Evaluation (Addendum)
Occupational Therapy Evaluation Patient Details Name: Kristine Francis MRN: [id number] DOB: 01/15/1941 Today's Date: 12/03/2020    History of Present Illness 80 y.o. female presenting from home initially as code stroke with LOC and R-sided gaze and aphasia. Grand mal seizure with loss of pulse in E.D. intubated on 1/5 then extubated on 1/8. Patient also found to have acute encephalopathy, dysphagia/aspiration pneumonia and acute hypoxic respiratory failure.   Clinical Impression   PTA patient was living with her spouse in a private residence and was grossly Mod I to supervision A with ADLs/IADLs. Per family report, patient with some ataxia and unsteadiness at baseline require hand held assist for functional mobility at home and in community dwellings. Patient often "furniture walks" in home environment as well. Patient currently functioning below baseline requiring Mod to Max A grossly for ADLs, ADL transfers, and short-distance functional mobility in hospital room with use of RW vs. +1 HHA. Patient limited by increased ataxia in comparison to baseline, dysmetria, decreased sitting/standing balance, and generalized weakness. Patient would benefit from continued acute OT services in prep for safe d/c to next level of care with recommendation for CIR given patients PLOF and motivation. Per chart review, CIR declined with current recommendation for SNF rehab.     Follow Up Recommendations  SNF    Equipment Recommendations  3 in 1 bedside commode;Tub/shower seat    Recommendations for Other Services       Precautions / Restrictions Precautions Precautions: Fall;Other (comment) Precaution Comments: seizure Restrictions Weight Bearing Restrictions: No      Mobility Bed Mobility Overal bed mobility: Needs Assistance Bed Mobility: Rolling;Supine to Sit;Sit to Supine Rolling: Min assist   Supine to sit: Mod assist     General bed mobility comments: Min A and cues for hand placement. Mod  A at trunk to sit upright. Patient able to advance BLE toward EOB without assist.    Transfers Overall transfer level: Needs assistance Equipment used: Rolling walker (2 wheeled) Transfers: Sit to/from [facility] Sit to Stand: Mod assist;From elevated surface Stand pivot transfers: Mod assist       General transfer comment: Mod A for sit to stand from EOB and Mod A for SPT with maximal cues for walker management and foot placement. Patient seemingly unaware of body in space with LLE often outside of confines of walker.    Balance Overall balance assessment: Needs assistance Sitting-balance support: Single extremity supported;Feet supported Sitting balance-Leahy Scale: Poor Sitting balance - Comments: Min A at most and close Min guard at best to maintain sitting balance at EOB. Postural control: Left lateral lean Standing balance support: Bilateral upper extremity supported Standing balance-Leahy Scale: Poor Standing balance comment: Forward bias despite multimodal cues and BUE supported on RW with Mod A to maintain standing balance.                           ADL either performed or assessed with clinical judgement   ADL Overall ADL's : Needs assistance/impaired                     Lower Body Dressing: Maximal assistance;Sit to/from stand Lower Body Dressing Details (indicate cue type and reason): Mod A to doff/don footwear seated EOB. Toilet Transfer: Moderate assistance;RW Toilet Transfer Details (indicate cue type and reason): Simulated with tranfser to recliner with RW. Maximal cues for foot placement and walker management.         Functional mobility  during ADLs: Moderate assistance;Rolling walker General ADL Comments: Mod A with use of RW. Maximal cues for foot placement and walker management.     Vision   Vision Assessment?: No apparent visual deficits     Perception     Praxis      Pertinent Vitals/Pain Pain Assessment:  No/denies pain     Hand Dominance Right   Extremity/Trunk Assessment Upper Extremity Assessment Upper Extremity Assessment: Generalized weakness       Cervical / Trunk Assessment Cervical / Trunk Assessment: Kyphotic   Communication Communication Communication: No difficulties   Cognition Arousal/Alertness: Awake/alert Behavior During Therapy: Flat affect Overall Cognitive Status: Impaired/Different from baseline Area of Impairment: Problem solving                             Problem Solving: Slow processing;Requires verbal cues;Requires tactile cues General Comments: Patient requires repeat cueing for sequencing functional mobiltiy with use of RW.   General Comments  VSS    Exercises     Shoulder Instructions      Home Living Family/patient expects to be discharged to:: Private residence Living Arrangements: Spouse/significant other Available Help at Discharge: Family;Available 24 hours/day Type of Home: House Home Access: Stairs to enter [employer] of Steps: 1 Entrance Stairs-Rails: None Home Layout: One level     Bathroom Shower/Tub: Teacher, early years/pre: Standard     Home Equipment: Environmental consultant - 4 wheels;Grab bars - tub/shower          Prior Functioning/Environment Level of Independence: Independent with assistive device(s)        Comments: Intermittent use of AD in home vs. furniture walking. Patient was Mod I with ADLs/IADLs including housekeeping and meal prep.        OT Problem List: Decreased strength;Decreased range of motion;Decreased activity tolerance;Impaired balance (sitting and/or standing);Decreased coordination;Decreased safety awareness;Decreased knowledge of use of DME or AE      OT Treatment/Interventions: Self-care/ADL training;Therapeutic exercise;Neuromuscular education;Energy conservation;DME and/or AE instruction;Therapeutic activities;Patient/family education;Balance training    OT  Goals(Current goals can be found in the care plan section) Acute Rehab OT Goals Patient Stated Goal: to return to independence OT Goal Formulation: With patient Time For Goal Achievement: 12/17/20 Potential to Achieve Goals: Good ADL Goals Pt Will Perform Grooming: with set-up;sitting Pt Will Perform Upper Body Dressing: with set-up;sitting Pt Will Perform Lower Body Dressing: with min assist;sit to/from stand Pt Will Transfer to Toilet: with min assist;ambulating;bedside commode Pt Will Perform Toileting - Clothing Manipulation and hygiene: with min assist;sit to/from stand Pt/caregiver will Perform Home Exercise Program: Increased strength;Increased ROM;Both right and left upper extremity;With written HEP provided Additional ADL Goal #1: Patient will maintain static sitting balance with supervision A during ADL tasks.  OT Frequency: Min 2X/week   Barriers to D/C:            Co-evaluation              AM-PAC OT "6 Clicks" Daily Activity     Outcome Measure Help from another person eating meals?: Total ([employer]) Help from another person taking care of personal grooming?: A Lot Help from another person toileting, which includes using toliet, bedpan, or urinal?: A Lot Help from another person bathing (including washing, rinsing, drying)?: A Lot Help from another person to put on and taking off regular upper body clothing?: A Lot Help from another person to put on and taking off regular lower body clothing?: A Lot  6 Click Score: 11   End of Session Equipment Utilized During Treatment: Gait belt;Rolling walker Nurse Communication: Mobility status  Activity Tolerance: Patient tolerated treatment well Patient left: in chair;with call bell/phone within reach;with chair alarm set;with family/visitor present;Other (comment) (SPL in room)  OT Visit Diagnosis: Unsteadiness on feet (R26.81);Muscle weakness (generalized) (M62.81);History of falling (Z91.81)                Time:  1203-1229 OT Time Calculation (min): 26 min Charges:  OT General Charges $OT Visit: 1 Visit OT Evaluation $OT Eval Moderate Complexity: 1 Mod OT Treatments $Self Care/Home Management : 8-22 mins  Jaclyn Carew H. OTR/L Supplemental OT, Department of rehab services [phone]  Verlaine Embry R H. 12/03/2020, 1:27 PM
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
## 2020-12-03 NOTE — Progress Notes (Signed)
Physical Therapy Treatment Patient Details Name: SHALIMAR MCCLAIN MRN: [id number] DOB: [birth year] Today's Date: 12/03/2020    History of Present Illness 80 y.o. female presenting from home initially as code stroke with LOC and R-sided gaze and aphasia. Grand mal seizure with loss of pulse in E.D. intubated on 1/5 then extubated on 1/8. Patient also found to have acute encephalopathy, dysphagia/aspiration pneumonia and acute hypoxic respiratory failure.    PT Comments    Pt tolerates treatment well with multiple trials of gait training this session. Pt is generally weak and requires cues to improve foot clearance and safety during transfers. Pt will benefit from attempted use of a pediatric RW at next session as the current adult walker in the room is too tall on the lowest setting to allow for proper posture and leverage. Pt will benefit from continued acute PT POC to improve mobility quality and to reduce falls risk. PT continues to recommend CIR placement at this time as the pt demonstrates good activity tolerance and the potential to make significant functional gains with high intensity inpatient PT services.   Follow Up Recommendations  CIR     Equipment Recommendations  Rolling walker with 5" wheels;Wheelchair (measurements PT)    Recommendations for Other Services       Precautions / Restrictions Precautions Precautions: Fall;Other (comment) Precaution Comments: seizure Restrictions Weight Bearing Restrictions: No    Mobility  Bed Mobility Overal bed mobility: Needs Assistance Bed Mobility: Rolling;Supine to Sit;Sit to Supine Rolling: Min assist   Supine to sit: Mod assist     General bed mobility comments: pt received and left in recliner  Transfers Overall transfer level: Needs assistance Equipment used: Rolling walker (2 wheeled) Transfers: Sit to/from Stand Sit to Stand: Min assist Stand pivot transfers: Mod assist       General transfer comment: PT cues for  hand placement  Ambulation/Gait Ambulation/Gait assistance: Min assist Gait Distance (Feet): 6 Feet (trials of 2', 4', 6' x2 forward and backward to recliner) Assistive device: Rolling walker (2 wheeled) Gait Pattern/deviations: Step-to pattern Gait velocity: reduced Gait velocity interpretation: <1.31 ft/sec, indicative of household ambulator General Gait Details: pt with short shuffling steps initially, requires cues for increased foot clearance. Pt with flexed trunk posture over RW, will benefit from pediatric RW to allow for improved posture and leverage   Stairs             Wheelchair Mobility    Modified Rankin (Stroke Patients Only)       Balance Overall balance assessment: Needs assistance Sitting-balance support: Single extremity supported;Bilateral upper extremity supported;Feet supported Sitting balance-Leahy Scale: Poor Sitting balance - Comments: reliant on UE support of recliner Postural control: Left lateral lean Standing balance support: Bilateral upper extremity supported Standing balance-Leahy Scale: Poor Standing balance comment: minG-minA with BUE support of RW                            Cognition Arousal/Alertness: Awake/alert Behavior During Therapy: WFL for tasks assessed/performed Overall Cognitive Status: Impaired/Different from baseline Area of Impairment: Safety/judgement;Awareness;Problem solving                         Safety/Judgement: Decreased awareness of deficits;Decreased awareness of safety Awareness: Emergent Problem Solving: Slow processing;Requires verbal cues;Requires tactile cues General Comments: Patient requires repeat cueing for sequencing functional mobiltiy with use of RW.      Exercises      General  Comments General comments (skin integrity, edema, etc.): pt on 4L [place] for mobility, intermittent coughing during session. Pt sats decrease during last gait trial but likely related to grip on RW as pt  is asymptomatic and sats return to low 90s after release of RW      Pertinent Vitals/Pain Pain Assessment: No/denies pain    Home Living Family/patient expects to be discharged to:: Private residence Living Arrangements: Spouse/significant other Available Help at Discharge: Family;Available 24 hours/day Type of Home: House Home Access: Stairs to enter Entrance Stairs-Rails: None Home Layout: One level Home Equipment: Walker - 4 wheels;Grab bars - tub/shower      Prior Function Level of Independence: Independent with assistive device(s)      Comments: Intermittent use of AD in home vs. furniture walking. Patient was Mod I with ADLs/IADLs including housekeeping and meal prep.   PT Goals (current goals can now be found in the care plan section) Acute Rehab PT Goals Patient Stated Goal: to return to independence Progress towards PT goals: Progressing toward goals    Frequency    Min 3X/week      PT Plan Current plan remains appropriate    Co-evaluation              AM-PAC PT "6 Clicks" Mobility   Outcome Measure  Help needed turning from your back to your side while in a flat bed without using bedrails?: A Lot Help needed moving from lying on your back to sitting on the side of a flat bed without using bedrails?: A Lot Help needed moving to and from a bed to a chair (including a wheelchair)?: A Little Help needed standing up from a chair using your arms (e.g., wheelchair or bedside chair)?: A Little Help needed to walk in hospital room?: A Little Help needed climbing 3-5 steps with a railing? : A Lot 6 Click Score: 15    End of Session Equipment Utilized During Treatment: Oxygen;Gait belt Activity Tolerance: Patient tolerated treatment well Patient left: in chair;with call bell/phone within reach;with chair alarm set;with family/visitor present Nurse Communication: Mobility status PT Visit Diagnosis: Other abnormalities of gait and mobility (R26.89);Muscle  weakness (generalized) (M62.81);Other symptoms and signs involving the nervous system (R29.898)     Time: 1327-1400 PT Time Calculation (min) (ACUTE ONLY): 33 min  Charges:  $Gait Training: 23-37 mins                     Zenaida Niece, PT, DPT Acute Rehabilitation Pager: [phone]    Zenaida Niece 12/03/2020, 2:30 PM

## 2020-12-04 ENCOUNTER — Inpatient Hospital Stay (HOSPITAL_COMMUNITY): Payer: Medicare Other

## 2020-12-04 DIAGNOSIS — R0902 Hypoxemia: Secondary | ICD-10-CM

## 2020-12-04 DIAGNOSIS — G9340 Encephalopathy, unspecified: Secondary | ICD-10-CM | POA: Diagnosis not present

## 2020-12-04 DIAGNOSIS — J449 Chronic obstructive pulmonary disease, unspecified: Secondary | ICD-10-CM

## 2020-12-04 DIAGNOSIS — G40409 Other generalized epilepsy and epileptic syndromes, not intractable, without status epilepticus: Principal | ICD-10-CM

## 2020-12-04 LAB — BASIC METABOLIC PANEL
Anion gap: 11 (ref 5–15)
BUN: 25 mg/dL — ABNORMAL HIGH (ref 8–23)
CO2: 29 mmol/L (ref 22–32)
Calcium: 9.2 mg/dL (ref 8.9–10.3)
Chloride: 95 mmol/L — ABNORMAL LOW (ref 98–111)
Creatinine, Ser: 0.53 mg/dL (ref 0.44–1.00)
GFR, Estimated: >60 mL/min (ref >60)
Glucose, Bld: 130 mg/dL — ABNORMAL HIGH (ref 70–99)
Potassium: 4.5 mmol/L (ref 3.5–5.1)
Sodium: 135 mmol/L (ref 135–145)

## 2020-12-04 LAB — GLUCOSE, CAPILLARY
Glucose-Capillary: 115 mg/dL — ABNORMAL HIGH (ref 70–99)
Glucose-Capillary: 128 mg/dL — ABNORMAL HIGH (ref 70–99)
Glucose-Capillary: 136 mg/dL — ABNORMAL HIGH (ref 70–99)
Glucose-Capillary: 138 mg/dL — ABNORMAL HIGH (ref 70–99)
Glucose-Capillary: 171 mg/dL — ABNORMAL HIGH (ref 70–99)
Glucose-Capillary: 87 mg/dL (ref 70–99)

## 2020-12-04 LAB — CBC
HCT: 33.1 % — ABNORMAL LOW (ref 36.0–46.0)
Hemoglobin: 11.1 g/dL — ABNORMAL LOW (ref 12.0–15.0)
MCH: 30.7 pg (ref 26.0–34.0)
MCHC: 33.5 g/dL (ref 30.0–36.0)
MCV: 91.7 fL (ref 80.0–100.0)
Platelets: 341 10*3/uL (ref 150–400)
RBC: 3.61 MIL/uL — ABNORMAL LOW (ref 3.87–5.11)
RDW: 14.5 % (ref 11.5–15.5)
WBC: 14.6 10*3/uL — ABNORMAL HIGH (ref 4.0–10.5)
nRBC: 0 % (ref 0.0–0.2)

## 2020-12-04 MED ORDER — FREE WATER
200.0000 mL | Freq: Four times a day (QID) | Status: DC
  Administered 2020-12-04 – 2020-12-05 (×5): 200 mL

## 2020-12-04 MED ORDER — RESOURCE THICKENUP CLEAR PO POWD
ORAL | Status: DC | PRN
  Filled 2020-12-04: qty 125

## 2020-12-04 NOTE — Progress Notes (Signed)
  Speech Language Pathology Treatment: Dysphagia  Patient Details Name: Kristine Francis MRN: [id number] DOB: 10-22-1941 Today's Date: 12/04/2020 Time: 1443-1540 SLP Time Calculation (min) (ACUTE ONLY): 12 min  Assessment / Plan / Recommendation Clinical Impression  Treatment focused on eduction re: MBS with pt and husband (who did not accompany pt to study). Explained that pt's swallow albeit functional prior to stroke, was likely altered and now more difficult for pt to compensate since stroke. Explained results, strategies used and clinical reasoning for modifications to texture/liquids. Answered questions and discussed plans for continued ST at discharge.     HPI HPI: 80 yo female admitted 11/25/20 after becoming unresponsive. [name], unable to use RUE. Intubated in ED. 2 minutes of CPR. MRI Multiple foci of susceptibility artifact in the bilateral cerebral hemispheres more pronounced in occipital region. Remote lacunar infarct in the right basal ganglia region, PMH: cervical fusion 2010, CVA (2018), HTN, HLD, gait abnormality, anemia, emphysema, asthma, GERD, diverticulosis, renal cyst, osteopenia. ETT 1/5-8/22      SLP Plan  Continue with current plan of care       Recommendations  Diet recommendations: Dysphagia 2 (fine chop);Honey-thick liquid Liquids provided via: Cup Medication Administration: Crushed with puree Supervision: Patient able to self feed;Full supervision/cueing for compensatory strategies Compensations: Slow rate;Small sips/bites;Multiple dry swallows after each bite/sip;Clear throat intermittently Postural Changes and/or Swallow Maneuvers: Seated upright 90 degrees                Oral Care Recommendations: Oral care BID Follow up Recommendations: Skilled Nursing facility SLP Visit Diagnosis: Dysphagia, pharyngeal phase (R13.13) Plan: Continue with current plan of care       GO                [name] 12/04/2020, 3:37 PM   Orbie Pyo Stefannie Defeo M.Ed Risk analyst [phone] Office [phone]
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
## 2020-12-04 NOTE — Progress Notes (Signed)
TRH night shift.  The staff reports the patient is on continuous tube feedings, but has no free water added.  Her last sodium level was 140 mmol/L2 days ago.  Free water 200 mL every 6 hours via PEG tube ordered.  Tennis Must, MD.

## 2020-12-04 NOTE — TOC Initial Note (Signed)
Transition of Care [facility]) - Initial/Assessment Note    Patient Details  Name: Kristine Francis MRN: [id number] Date of Birth: 1940/11/30  [facility] of [facility]) CM/SW Contact:    Verdell Carmine, RN Phone Number: 12/04/2020, 7:57 AM  Clinical Narrative:                 Admitted for seizure, MRI reveals remote infarct, microhemmhorage due to HPTN . PT and OT consult recommend CIR, would benefit from intensive therapy Physician believes she may do better with SNF placement vs IP rehabilitation. Will speak to PT about current recommendations. Will need DME post acute rehab/SNF.Eligibility for eliquis sent co-pay 38.00 CM will follow for needs  Expected Discharge Plan: [place] Barriers to Discharge: Continued Medical Work up   Patient Goals and CMS Choice        Expected Discharge Plan and Services Expected Discharge Plan: [place]   Discharge Planning Services: CM Consult   Living arrangements for the past 2 months: Single Family Home                                      Prior Living Arrangements/Services Living arrangements for the past 2 months: Single Family Home Lives with:: Spouse Patient language and need for interpreter reviewed:: Yes        Need for Family Participation in Patient Care: Yes (Comment) Care giver support system in place?: Yes (comment)   Criminal Activity/Legal Involvement Pertinent to Current Situation/Hospitalization: No - Comment as needed  Activities of Daily Living      Permission Sought/Granted                  Emotional Assessment       Orientation: : Oriented to Self,Oriented to Place,Oriented to Situation Alcohol / Substance Use: Not Applicable Psych Involvement: No (comment)  Admission diagnosis:  Cardiac arrest ([place]) [I46.9] Seizures ([place]) [R56.9] Acute stroke due to ischemia [facility]) [I63.9] Patient Active Problem List   Diagnosis Date Noted  . Grand mal seizure ([place]) 12/02/2020  . COPD  (chronic obstructive pulmonary disease) ([place]) 12/02/2020  . Malnutrition of moderate degree 11/28/2020  . Acute encephalopathy   . Acute respiratory failure with hypoxia ([place])   . Aspiration into airway   . Cardiac arrest ([place])   . Groin mass 08/20/2020  . Falls infrequently 03/27/2019  . Sensory neuropathy 02/01/2019  . 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 Cerebellar ataxia ([place]) 08/23/2018  . Sensory polyneuropathy 08/23/2018  . Incontinence of feces   . Visual disturbances 02/06/2017  . Essential hypertension 11/07/2016  . Hyperlipidemia 04/24/2015  . Dysuria 02/11/2015  . Acute cystitis without hematuria 02/11/2015  . Chest pain 02/06/2015  . Left inguinal hernia 08/02/2013  . Gait abnormality 05/14/2013  . Gait disorder   . Nystagmus, end-position   . Routine health maintenance 12/13/2011  . LEG CRAMPS, NOCTURNAL 12/07/2010  . CALLUS, TOE 03/19/2010  . hip pain 03/19/2010  . ATAXIA 11/17/2008  . CERVICAL RADICULOPATHY, RIGHT 10/21/2008  . DIVERTICULOSIS OF COLON 08/14/2008  . RENAL CYST 08/14/2008  . ANEMIA-IRON DEFICIENCY 06/16/2007  . Asthma 06/16/2007  . GERD 06/16/2007  . OSTEOPENIA 06/16/2007   PCP:  [name], MD Pharmacy:  No Pharmacies Listed    Social Determinants of Health (SDOH) Interventions    Readmission Risk Interventions No flowsheet data found.

## 2020-12-04 NOTE — Progress Notes (Signed)
Physical Therapy Treatment Patient Details Name: Kristine Francis MRN: [id number] DOB: 1941-02-03 Today's Date: 12/04/2020    History of Present Illness 80 y.o. female presenting from home initially as code stroke with LOC and R-sided gaze and aphasia. Grand mal seizure with loss of pulse in E.D. intubated on 1/5 then extubated on 1/8. Patient also found to have acute encephalopathy, dysphagia/aspiration pneumonia and acute hypoxic respiratory failure.    PT Comments    Pt tolerates treatment well although she does seem to fatigue more quickly this session than in previous sessions, requiring multiple brief rest breaks in between short bouts of physical activity. Pt demonstrates impaired coordination of BUE with more significant deficits in LUE at this time. Pt is generally weak and remains at a high falls risk due to weakness, imbalance, and poor activity tolerance. Pt will continue to benefit from acute PT POC to progress gait and balance training in order to reduce falls risk. PT updates recommendations to SNF placement at this time as the pt demonstrates more signs of fatigue today. Pt may benefit from a longer term post-acute rehab setting.  Follow Up Recommendations  SNF     Equipment Recommendations  Rolling walker with 5" wheels;Wheelchair (measurements PT)    Recommendations for Other Services       Precautions / Restrictions Precautions Precautions: Fall;Other (comment) Precaution Comments: seizure Restrictions Weight Bearing Restrictions: No    Mobility  Bed Mobility Overal bed mobility: Needs Assistance Bed Mobility: Supine to Sit     Supine to sit: Mod assist;HOB elevated        Transfers Overall transfer level: Needs assistance Equipment used: Rolling walker (2 wheeled);1 person hand held assist Transfers: Sit to/from [facility] Sit to Stand: Min assist;Min guard Stand pivot transfers: Mod assist       General transfer comment: pt  requires minA initially, minG with PT cues for hand and foot placement. Pt requires modA for SPT with bilateral UE support of PT into bathroom  Ambulation/Gait Ambulation/Gait assistance: Mod assist Gait Distance (Feet): 8 Feet Assistive device: Rolling walker (2 wheeled) Gait Pattern/deviations: Step-to pattern;Trunk flexed;Shuffle Gait velocity: reduced Gait velocity interpretation: <1.31 ft/sec, indicative of household ambulator General Gait Details: pt with short shuffling steps, significant trunk flexion, poor control of RW with LUE at this time.   Stairs             Wheelchair Mobility    Modified Rankin (Stroke Patients Only)       Balance Overall balance assessment: Needs assistance Sitting-balance support: No upper extremity supported;Feet supported;Single extremity supported;Bilateral upper extremity supported Sitting balance-Leahy Scale: Poor Sitting balance - Comments: reliant on UE support Postural control:  (anterior lean) Standing balance support: Bilateral upper extremity supported Standing balance-Leahy Scale: Poor Standing balance comment: min-modA for static standing                            Cognition Arousal/Alertness: Awake/alert Behavior During Therapy: WFL for tasks assessed/performed Overall Cognitive Status: Impaired/Different from baseline Area of Impairment: Attention;Memory;Following commands;Safety/judgement;Awareness;Problem solving                   Current Attention Level: Selective Memory: Decreased recall of precautions;Decreased short-term memory Following Commands: Follows one step commands consistently Safety/Judgement: Decreased awareness of safety;Decreased awareness of deficits Awareness: Emergent Problem Solving: Slow processing;Difficulty sequencing;Requires verbal cues;Requires tactile cues General Comments: poor retention of PT cues within session      Exercises  General Comments General  comments (skin integrity, edema, etc.): pt on 4L [place] for mobility, pleth reading desat with mobility however unreliable reading. Pt does fatigue quickly with noted increased RR. Pt sats quickly recover when sitting and resting      Pertinent Vitals/Pain Pain Assessment: Faces Pain Score: 0-No pain Faces Pain Scale: No hurt    Home Living                      Prior Function            PT Goals (current goals can now be found in the care plan section) Acute Rehab PT Goals Patient Stated Goal: to return to independence Progress towards PT goals: Progressing toward goals    Frequency    Min 3X/week      PT Plan Current plan remains appropriate    Co-evaluation              AM-PAC PT "6 Clicks" Mobility   Outcome Measure  Help needed turning from your back to your side while in a flat bed without using bedrails?: A Lot Help needed moving from lying on your back to sitting on the side of a flat bed without using bedrails?: A Lot Help needed moving to and from a bed to a chair (including a wheelchair)?: A Lot Help needed standing up from a chair using your arms (e.g., wheelchair or bedside chair)?: A Little Help needed to walk in hospital room?: A Little Help needed climbing 3-5 steps with a railing? : A Lot 6 Click Score: 14    End of Session Equipment Utilized During Treatment: Gait belt;Oxygen Activity Tolerance: Patient tolerated treatment well Patient left: in chair;with call bell/phone within reach;with chair alarm set;with family/visitor present Nurse Communication: Mobility status PT Visit Diagnosis: Other abnormalities of gait and mobility (R26.89);Muscle weakness (generalized) (M62.81);Other symptoms and signs involving the nervous system (R29.898)     Time: 9794-8016 PT Time Calculation (min) (ACUTE ONLY): 53 min  Charges:  $Gait Training: 23-37 mins $Therapeutic Activity: 23-37 mins                     Zenaida Niece, PT, DPT Acute  Rehabilitation Pager: [phone]    Zenaida Niece 12/04/2020, 12:39 PM

## 2020-12-04 NOTE — Progress Notes (Signed)
Modified Barium Swallow Progress Note  Patient Details  Name: Kristine Francis MRN: [id number] Date of Birth: 03/26/1941  Today's Date: 12/04/2020  Modified Barium Swallow completed.  Full report located under Chart Review in the Imaging Section.  Brief recommendations include the following:  Clinical Impression  Pt exhibits an acute dysphagia on chronic cervical anomalies resulting in laryngeal penetration, brief aspiration and pharyngeal residue. Cervical vertebrae is significantly kyphotic with noted cervical hardward (2010). Despite intermittently reduced tongue base retraction and incomplete epiglottic deflection (horizontal position), she is able to protect airway during the swallow in majority of trias. The cohesiveness of bolus with mistimed onset is affected allowing penetration before swallow to vocal cords fallling below briefly. Aggregation of nectar thick to pyriform sinuses with barium squeezed into vestible and below cords briefly. Her chin tuck posture limited cervical ROM. There was insufficient sensation with penetration episodes. Residue was mildly increased with honey thick but not significant and resulted in more timely swallow through pharynx. There is a prominent cricopharyngeus muscle. Recommend Dys 2, honey thick liquids, multiple swallows and intermittent throat clear and crushed meds.   Swallow Evaluation Recommendations       SLP Diet Recommendations: Dysphagia 2 (Fine chop) solids;Honey thick liquids   Liquid Administration via: Cup;No straw   Medication Administration: Crushed with puree   Supervision: Patient able to self feed;Full supervision/cueing for compensatory strategies   Compensations: Slow rate;Small sips/bites;Multiple dry swallows after each bite/sip;Clear throat intermittently   Postural Changes: Seated upright at 90 degrees   Oral Care Recommendations: Oral care BID        [name] 12/04/2020,2:25 PM  Orbie Pyo Colvin Caroli.Ed Risk analyst [phone] Office [phone]

## 2020-12-04 NOTE — Progress Notes (Signed)
  Speech Language Pathology Treatment: Dysphagia  Patient Details Name: Kristine Francis MRN: [id number] DOB: June 05, 1941 Today's Date: 12/04/2020 Time: 1000-1017 SLP Time Calculation (min) (ACUTE ONLY): 17 min  Assessment / Plan / Recommendation Clinical Impression  Pt more alert today, up in chair and husband present. There is significant suspicion of airway intrusion likely with possible differences in cervical anatomy. Head significantly extended on arrival with limited flexion and she denies prior cervical arthritis. In addition, swallow is loud, clunky with multiple swallows and applesauce extracted with oral cavity suctioned. Immediate cough with cup sip water. Full assessment with MBS warranted and scheduled for 12:00 today.    HPI HPI: 80 yo female admitted 11/25/20 after becoming unresponsive. [name], unable to use RUE. Intubated in ED. 2 minutes of CPR. MRI Multiple foci of susceptibility artifact in the bilateral cerebral hemispheres more pronounced in occipital region. Remote lacunar infarct in the right basal ganglia region, PMH: CVA (2018), HTN, HLD, gait abnormality, anemia, emphysema, asthma, GERD, diverticulosis, renal cyst, osteopenia. ETT 1/5-8/22      SLP Plan  MBS       Recommendations  Diet recommendations: NPO Medication Administration: Via alternative means                Oral Care Recommendations: Oral care QID Follow up Recommendations:  (TBD) SLP Visit Diagnosis: Dysphagia, unspecified (R13.10) Plan: MBS       GO                [name] 12/04/2020, 10:28 AM

## 2020-12-04 NOTE — Progress Notes (Signed)
TRH night shift progressive unit progress note.  The staff reports that the patient has been restless.  She seemed to have a similar picture during the previous night shift and was given low-dose lorazepam 0.5 mg via PEG x1 dose.  Earlier in the hospital stay, she was on a Precedex, then switched to Seroquel, which was subsequently discontinued on 12/03/2019.  I will reorder a single dose of lorazepam 0.5 mg via PEG.  Tennis Must, MD.

## 2020-12-05 DIAGNOSIS — G40409 Other generalized epilepsy and epileptic syndromes, not intractable, without status epilepticus: Secondary | ICD-10-CM | POA: Diagnosis not present

## 2020-12-05 LAB — GLUCOSE, CAPILLARY
Glucose-Capillary: 111 mg/dL — ABNORMAL HIGH (ref 70–99)
Glucose-Capillary: 117 mg/dL — ABNORMAL HIGH (ref 70–99)
Glucose-Capillary: 130 mg/dL — ABNORMAL HIGH (ref 70–99)
Glucose-Capillary: 133 mg/dL — ABNORMAL HIGH (ref 70–99)
Glucose-Capillary: 141 mg/dL — ABNORMAL HIGH (ref 70–99)
Glucose-Capillary: 156 mg/dL — ABNORMAL HIGH (ref 70–99)

## 2020-12-05 LAB — BASIC METABOLIC PANEL
Anion gap: 8 (ref 5–15)
BUN: 25 mg/dL — ABNORMAL HIGH (ref 8–23)
CO2: 31 mmol/L (ref 22–32)
Calcium: 8.8 mg/dL — ABNORMAL LOW (ref 8.9–10.3)
Chloride: 95 mmol/L — ABNORMAL LOW (ref 98–111)
Creatinine, Ser: 0.53 mg/dL (ref 0.44–1.00)
GFR, Estimated: >60 mL/min (ref >60)
Glucose, Bld: 149 mg/dL — ABNORMAL HIGH (ref 70–99)
Potassium: 4.4 mmol/L (ref 3.5–5.1)
Sodium: 134 mmol/L — ABNORMAL LOW (ref 135–145)

## 2020-12-05 LAB — CULTURE, BLOOD (ROUTINE X 2)
Culture: NO GROWTH
Special Requests: ADEQUATE

## 2020-12-05 LAB — CBC
HCT: 26.8 % — ABNORMAL LOW (ref 36.0–46.0)
Hemoglobin: 8.6 g/dL — ABNORMAL LOW (ref 12.0–15.0)
MCH: 30.1 pg (ref 26.0–34.0)
MCHC: 32.1 g/dL (ref 30.0–36.0)
MCV: 93.7 fL (ref 80.0–100.0)
Platelets: 322 10*3/uL (ref 150–400)
RBC: 2.86 MIL/uL — ABNORMAL LOW (ref 3.87–5.11)
RDW: 14.6 % (ref 11.5–15.5)
WBC: 15.2 10*3/uL — ABNORMAL HIGH (ref 4.0–10.5)
nRBC: 0 % (ref 0.0–0.2)

## 2020-12-05 LAB — TSH: TSH: 6.413 u[IU]/mL — ABNORMAL HIGH (ref 0.350–4.500)

## 2020-12-05 LAB — T4, FREE: Free T4: 0.96 ng/dL (ref 0.61–1.12)

## 2020-12-05 MED ORDER — LEVETIRACETAM 100 MG/ML PO SOLN
500.0000 mg | Freq: Two times a day (BID) | ORAL | Status: DC
  Administered 2020-12-05 – 2020-12-06 (×3): 500 mg via ORAL
  Filled 2020-12-05 (×4): qty 5

## 2020-12-05 MED ORDER — ADULT MULTIVITAMIN W/MINERALS CH
1.0000 | ORAL_TABLET | Freq: Every day | ORAL | Status: DC
  Administered 2020-12-06 – 2020-12-10 (×5): 1 via ORAL
  Filled 2020-12-05 (×5): qty 1

## 2020-12-05 MED ORDER — ATORVASTATIN CALCIUM 10 MG PO TABS
10.0000 mg | ORAL_TABLET | Freq: Every day | ORAL | Status: DC
  Administered 2020-12-06 – 2020-12-10 (×5): 10 mg via ORAL
  Filled 2020-12-05 (×5): qty 1

## 2020-12-05 MED ORDER — DOCUSATE SODIUM 50 MG/5ML PO LIQD: 100.0000 mg | Freq: Two times a day (BID) | ORAL | Status: DC | PRN

## 2020-12-05 MED ORDER — METOPROLOL TARTRATE 25 MG/10 ML ORAL SUSPENSION
25.0000 mg | Freq: Two times a day (BID) | ORAL | Status: DC
  Administered 2020-12-05 – 2020-12-06 (×3): 25 mg via ORAL
  Filled 2020-12-05 (×4): qty 10

## 2020-12-05 MED ORDER — ACETAMINOPHEN 160 MG/5ML PO SOLN
650.0000 mg | Freq: Four times a day (QID) | ORAL | Status: DC | PRN
  Filled 2020-12-05: qty 20.3

## 2020-12-05 MED ORDER — DOCUSATE SODIUM 100 MG PO CAPS: 100.0000 mg | ORAL_CAPSULE | Freq: Two times a day (BID) | ORAL | Status: DC | PRN

## 2020-12-05 MED ORDER — POLYETHYLENE GLYCOL 3350 17 G PO PACK: 17.0000 g | PACK | Freq: Every day | ORAL | Status: DC | PRN

## 2020-12-05 MED ORDER — APIXABAN 5 MG PO TABS
5.0000 mg | ORAL_TABLET | Freq: Two times a day (BID) | ORAL | Status: DC
  Administered 2020-12-05 – 2020-12-10 (×10): 5 mg via ORAL
  Filled 2020-12-05 (×10): qty 1

## 2020-12-05 NOTE — Progress Notes (Signed)
Inpatient Rehab Admissions Coordinator:     I met with pt. And husband to discuss CIR admit. They are interested in pursuing CIR, but are amenable to SNF I discussed Pt.'s case with PM&R physician and determined that Pt. Does demonstrate medical necessity for CIR. [facility] team will  follow for progress with PT/OT to ensure she can tolerate CIR level therapies and pursue for possible admission later this week.   Clemens Catholic, [place], [place] Admissions Coordinator  [phone] ([place]) [phone] (office)

## 2020-12-05 NOTE — Progress Notes (Signed)
  Speech Language Pathology Treatment: Dysphagia  Patient Details Name: Kristine Francis MRN: [id number] DOB: 12-08-1940 Today's Date: 12/05/2020 Time: 2094-7096 SLP Time Calculation (min) (ACUTE ONLY): 9 min  Assessment / Plan / Recommendation Clinical Impression  Pt was encountered awake/alert with husband present at bedside.  RN reported that the pt's Cortrak was removed earlier today.  Pt and husband reported that she had been tolerating her current diet without difficulty today (breakfast and lunch).  Pt was agreeable to minimal trials of honey-thick liquid via cup sip on this date.  She exhibited suspected delayed swallow initiation with multiple swallows observed (not as a strategy and possibly indicative of pharyngeal residue given MBS results) and she was cued to take additional dry swallows.  No overt s/sx of aspiration were observed with minimal trials.  She politely refused solid trials on this date.  Further discussed MBS results with pt and husband, and they verbalized understanding.  Recommend continuation of Dysphagia 2 (fine chop) solids and honey-thick liquids with medications administered crushed in puree.  SLP will continue to f/u to monitor diet tolerance and for diagnostic treatment.     HPI HPI: 80 yo female admitted 11/25/20 after becoming unresponsive. [name], unable to use RUE. Intubated in ED. 2 minutes of CPR. MRI Multiple foci of susceptibility artifact in the bilateral cerebral hemispheres more pronounced in occipital region. Remote lacunar infarct in the right basal ganglia region, PMH: cervical fusion 2010, CVA (2018), HTN, HLD, gait abnormality, anemia, emphysema, asthma, GERD, diverticulosis, renal cyst, osteopenia. ETT 1/5-8/22      SLP Plan  Continue with current plan of care       Recommendations  Diet recommendations: Dysphagia 2 (fine chop);Honey-thick liquid Liquids provided via: Cup Medication Administration: Crushed with puree Supervision: Patient  able to self feed;Full supervision/cueing for compensatory strategies Compensations: Slow rate;Small sips/bites;Multiple dry swallows after each bite/sip;Clear throat intermittently Postural Changes and/or Swallow Maneuvers: Seated upright 90 degrees                Oral Care Recommendations: Oral care BID Follow up Recommendations: Skilled Nursing facility SLP Visit Diagnosis: Dysphagia, pharyngeal phase (R13.13) Plan: Continue with current plan of care       GO               Colin Mulders M.S., [place] Office: [phone]  Max 12/05/2020, 3:54 PM

## 2020-12-05 NOTE — Progress Notes (Addendum)
Occupational Therapy Treatment Patient Details Name: Kristine Francis MRN: [id number] DOB: Apr 13, 1941 Today's Date: 12/05/2020    History of present illness 80 y.o. female presenting from home initially as code stroke with LOC and R-sided gaze and aphasia. Grand mal seizure with loss of pulse in E.D. intubated on 1/5 then extubated on 1/8. Patient also found to have acute encephalopathy, dysphagia/aspiration pneumonia and acute hypoxic respiratory failure.   OT comments  Pt. Was seen for skilled OT to increase I and safety with ADLs and mobility.  Pt. Worked on sitting balance EOB and required S with static tasks and supporting herself with B ue. Pt. Was Min guard A with dynamic tasks EOB. Pt. Was able to sit EOB for 20 min. Pt. Is progressing with mobility and tolerance for activity. Acute OT to follow.   Follow Up Recommendations  SNF CIR   Equipment Recommendations  3 in 1 bedside commode;Tub/shower seat    Recommendations for Other Services      Precautions / Restrictions Precautions Precautions: Fall;Other (comment) Precaution Comments: seizure Restrictions Weight Bearing Restrictions: No       Mobility Bed Mobility Overal bed mobility: Needs Assistance Bed Mobility: Supine to Sit     Supine to sit: Mod assist;HOB elevated Sit to supine: Max assist   General bed mobility comments: Pt. requires cues on technique.  Transfers Overall transfer level: Needs assistance   Transfers: Sit to/from Stand Sit to Stand: Mod assist Stand pivot transfers: Mod assist            Balance     Sitting balance-Leahy Scale: Poor Sitting balance - Comments: reliant on UE support     Standing balance-Leahy Scale: Poor                             ADL either performed or assessed with clinical judgement   ADL Overall ADL's : Needs assistance/impaired     Grooming: Wash/dry hands;Wash/dry face;Sitting;Min guard           Upper Body Dressing : Moderate  assistance;Sitting                   Functional mobility during ADLs: Moderate assistance (for sit to stand and side step to head of bed.) General ADL Comments: Pt. required Min guard assist to maintain sitting balance EOB.     Vision   Vision Assessment?: No apparent visual deficits   Perception     Praxis      Cognition Arousal/Alertness: Lethargic Behavior During Therapy: WFL for tasks assessed/performed Overall Cognitive Status: Impaired/Different from baseline                         Following Commands: Follows one step commands consistently                Exercises Exercises: General Upper Extremity General Exercises - Upper Extremity Shoulder Flexion: AROM;Both;20 reps Shoulder Extension: AROM;Both;20 reps Elbow Flexion: Both;20 reps;AAROM Elbow Extension: AROM;Both;20 reps   Shoulder Instructions       General Comments      Pertinent Vitals/ Pain       Pain Assessment: No/denies pain Pain Score: 0-No pain Faces Pain Scale: No hurt  Home Living  Prior Functioning/Environment              Frequency  Min 2X/week        Progress Toward Goals  OT Goals(current goals can now be found in the care plan section)  Progress towards OT goals: Progressing toward goals  Acute Rehab OT Goals Patient Stated Goal: to return to independence OT Goal Formulation: With patient Time For Goal Achievement: 12/17/20 Potential to Achieve Goals: Good ADL Goals Pt Will Perform Grooming: with set-up;sitting Pt Will Perform Upper Body Dressing: with set-up;sitting Pt Will Perform Lower Body Dressing: with min assist;sit to/from stand Pt Will Transfer to Toilet: with min assist;ambulating;bedside commode Pt Will Perform Toileting - Clothing Manipulation and hygiene: with min assist;sit to/from stand Pt/caregiver will Perform Home Exercise Program: Increased strength;Increased ROM;Both  right and left upper extremity;With written HEP provided Additional ADL Goal #1: Patient will maintain static sitting balance with supervision A during ADL tasks.  Plan Discharge plan remains appropriate    Co-evaluation                 AM-PAC OT "6 Clicks" Daily Activity     Outcome Measure   Help from another person eating meals?: Total Help from another person taking care of personal grooming?: A Lot Help from another person toileting, which includes using toliet, bedpan, or urinal?: A Lot Help from another person bathing (including washing, rinsing, drying)?: A Lot Help from another person to put on and taking off regular upper body clothing?: A Lot Help from another person to put on and taking off regular lower body clothing?: A Lot 6 Click Score: 11    End of Session    OT Visit Diagnosis: Unsteadiness on feet (R26.81);Muscle weakness (generalized) (M62.81);History of falling (Z91.81)   Activity Tolerance Patient tolerated treatment well   Patient Left in bed;with call bell/phone within reach;with bed alarm set;with family/visitor present   Nurse Communication  (nursing ok therapy)        Time: 6256-3893 OT Time Calculation (min): 34 min  Charges: OT General Charges $OT Visit: 1 Visit OT Treatments $Self Care/Home Management : 8-22 mins $Therapeutic Exercise: 8-22 mins  Reece Packer OT/L    Carolene Gitto 12/05/2020, 9:59 AM

## 2020-12-05 NOTE — TOC Progression Note (Addendum)
Transition of Care [facility]) - Progression Note    Patient Details  Name: Kristine Francis MRN: [id number] Date of Birth: May 23, 1941  Transition of [facility] CM/SW [place], [place] Phone Number: 12/05/2020, 12:15 PM  Clinical Narrative:     CSW spoke to pt and pt's spouse at bedside. They noted that they are interested in SNF and want for pt to return home after rehab. They asked that CSW speak with their son, Kristine Francis, who is helping them with their health care. CSW called Dwayne and he noted that he is also interested in SNF, and has no preference, but notes that he would like for her to remain in [place]. He notes that up until her seizure she was independent. She lives at home with her spouse, and has been fully vaccinated. He agreed for pt to be faxed out to facilities in the area. Pt's son stated that he is now more active in his mothers healthcare and the pt and husband confirmed that. He asked that he be updated by a doctor, as he had questions outside CSW's scope of practice. CSW will consult the dr.  Karma Francis left his email so the bed offers list can be emailed at [email or web address]. CSW will complete faxout and update family with offers.   1:00 pm CSW received update that family was now interested in CIR, and that pt was approved. TOC will sign off. Please consult if plans change.   Expected Discharge Plan: [place] Barriers to Discharge: Continued Medical Work up,SNF Pending bed offer  Expected Discharge Plan and Services Expected Discharge Plan: [place]   Discharge Planning Services: CM Consult Post Acute Care Choice: [place] Living arrangements for the past 2 months: Single Family Home                                       Social Determinants of Health (SDOH) Interventions    Readmission Risk Interventions No flowsheet data found.

## 2020-12-05 NOTE — Progress Notes (Signed)
PROGRESS NOTE    Kristine Francis  [id number] DOB: [birth year] DOA: 11/25/2020 PCP: [name], MD    Brief Narrative:  80 year old female with history of COPD, history of stroke, hypertension and neuropathy with balance issues brought to the ER with right-sided gaze deviation with aphasia.  She was at home, her granddaughter heard her scream and found patient bent over a table.  She was brought to the ER as a code stroke.  While in the emergency room she had a witnessed seizure.  Became bradycardic and then lost a pulse.  She was intubated in the ER, CPR for 2 minutes with ROSC.  Treated with Ativan and Keppra.  Neurology was consulted in the ER and recommended to continue [place]. 1/5-1/8, intubated in the ICU, delirium. 1/11, transferred to [facility] service. Passed swallow evaluation , coretrak to be removed 1/15   Assessment & Plan:   Principal Problem:   Grand mal seizure [facility]) Active Problems:   ANEMIA-IRON DEFICIENCY   ATAXIA   Acute encephalopathy   Cardiac arrest ([place])   Malnutrition of moderate degree   COPD (chronic obstructive pulmonary disease) ([place])  Grand mal seizure: Witnessed on admission MRI brain without any acute findings EEG with moderate to severe diffuse encephalopathy Treated with IV Keppra now transition to oral Keppra.  No more seizures now.  Will need neurology follow-up after discharge.  Acute metabolic encephalopathy: Suspected postictal state.  She is currently alert awake and oriented.  Mental status improved.  No focal deficits.  Passed swallow evaluation.  Started on dysphagia diet.  Discontinue core track today.  Encourage oral feeding.  Aspiration precautions.  Dysphagia/aspiration pneumonia/acute hypoxemic respiratory failure: Treated with 5 days of antibiotics.  Currently on room air.  She has some leukocytosis but no fever.  All-time aspiration precautions.  Cardiac arrest: PEA arrest secondary to seizure.  Echocardiogram was normal.  Grade 2  diastolic dysfunction.  Paroxysmal A. fib: Rate controlled.  Started on Eliquis.  On metoprolol.  TSH is normal.   Ataxia/neuropathy: Chronic problem.  B12 normal.  MRI with no acute findings.  Aggressive rehab and outpatient follow-up.  DVT prophylaxis: SCDs Start: 11/25/20 1747 apixaban (ELIQUIS) tablet 5 mg   Code Status: Full code Family Communication: Husband at the bedside, son on the phone Disposition Plan: Status is: Inpatient  Remains inpatient appropriate because:Inpatient level of care appropriate due to severity of illness   Dispo: The patient is from: Home              Anticipated d/c is to: CIR              Anticipated d/c date is: 2 days              Patient currently is not medically stable to d/c.         Consultants:   Neurology  PCCM  Procedures:   None  Antimicrobials:   Completed 5 days of Rocephin   Subjective: Patient was seen and examined.  No overnight events.  Husband was at the bedside.  She was happy to be able to swallow.  She has some cough on deep breathing.  Afebrile. No more seizures.  Objective: Vitals:   12/05/20 0500 12/05/20 0540 12/05/20 0808 12/05/20 1106  BP:  (!) 144/71 124/78 125/65  Pulse:  83 84 75  Resp:  12 16 16   Temp:   97.8 F (36.6 C) 98.3 F (36.8 C)  TempSrc:   Oral   SpO2:  98% 100%  93%  Weight: 48.5 kg     Height:       No intake or output data in the 24 hours ending 12/05/20 1334 Filed Weights   12/03/20 0454 12/04/20 0352 12/05/20 0500  Weight: 48.2 kg 47.9 kg 48.5 kg    Examination:  General exam: Appears calm and comfortable  Frail and debilitated.  On room air.  Chronically sick looking. Respiratory system: Clear to auscultation. Respiratory effort normal.  No added sounds. Cardiovascular system: S1 & S2 heard, RRR.  No edema. Gastrointestinal system: Abdomen is nondistended, soft and nontender. No organomegaly or masses felt. Normal bowel sounds heard. Central nervous system: Alert and  oriented. No focal neurological deficits.  Generalized weakness. Extremities: Symmetric 5 x 5 power.  Generalized weakness.     Data Reviewed: I have personally reviewed following labs and imaging studies  CBC: Recent Labs  Lab 12/01/20 0025 12/02/20 0332 12/03/20 0308 12/04/20 0417 12/05/20 0201  WBC 13.3* 10.9* 13.4* 14.6* 15.2*  NEUTROABS  --  9.2*  --   --   --   HGB 11.8* 10.7* 12.5 11.1* 8.6*  HCT 35.6* 32.7* 38.4 33.1* 26.8*  MCV 90.4 92.4 92.5 91.7 93.7  PLT 228 218 276 341 [id number]   Basic Metabolic Panel: Recent Labs  Lab 11/30/20 0125 11/30/20 1208 12/01/20 0025 12/02/20 0332 12/04/20 0417 12/05/20 0201  NA  --  138 142 140 135 134*  K 4.2 4.9 3.1* 3.9 4.5 4.4  CL  --  100 106 103 95* 95*  CO2  --  19* 27 27 29 31   GLUCOSE  --  92 257* 145* 130* 149*  BUN  --  15 14 10  25* 25*  CREATININE  --  0.54 0.73 0.50 0.53 0.53  CALCIUM  --  8.6* 8.6* 8.1* 9.2 8.8*  MG 1.7  --   --  1.7  --   --   PHOS 2.7  --  1.8* 2.7  --   --    GFR: Estimated Creatinine Clearance: 43.7 mL/min (by C-G formula based on SCr of 0.53 mg/dL). Liver Function Tests: Recent Labs  Lab 12/01/20 0025  ALBUMIN 2.6*   No results for input(s): LIPASE, AMYLASE in the last 168 hours. No results for input(s): AMMONIA in the last 168 hours. Coagulation Profile: No results for input(s): INR, PROTIME in the last 168 hours. Cardiac Enzymes: No results for input(s): CKTOTAL, CKMB, CKMBINDEX, TROPONINI in the last 168 hours. BNP (last 3 results) No results for input(s): PROBNP in the last 8760 hours. HbA1C: No results for input(s): HGBA1C in the last 72 hours. CBG: Recent Labs  Lab 12/04/20 2018 12/04/20 2339 12/05/20 0455 12/05/20 0808 12/05/20 1107  GLUCAP 171* 138* 156* 133* 141*   Lipid Profile: No results for input(s): CHOL, HDL, LDLCALC, TRIG, CHOLHDL, LDLDIRECT in the last 72 hours. Thyroid Function Tests: Recent Labs    12/05/20 0201  TSH 6.413*  FREET4 0.96   Anemia  Panel: Recent Labs    12/03/20 0308  [id number]   Sepsis Labs: No results for input(s): PROCALCITON, LATICACIDVEN in the last 168 hours.  Recent Results (from the past 240 hour(s))  Resp panel by RT-PCR (RSV, Flu A&B, Covid) Nasopharyngeal Swab     Status: None   Collection Time: 11/25/20  2:59 PM   Specimen: Nasopharyngeal Swab; Nasopharyngeal(NP) swabs in vial transport medium  Result Value Ref Range Status   SARS Coronavirus 2 by RT PCR NEGATIVE NEGATIVE Final    Comment: (NOTE) SARS-CoV-2  target nucleic acids are NOT DETECTED.  The SARS-CoV-2 RNA is generally detectable in upper respiratory specimens during the acute phase of infection. The lowest concentration of SARS-CoV-2 viral copies this assay can detect is 138 copies/mL. A negative result does not preclude SARS-Cov-2 infection and should not be used as the sole basis for treatment or other patient management decisions. A negative result may occur with  improper specimen collection/handling, submission of specimen other than nasopharyngeal swab, presence of viral mutation(s) within the areas targeted by this assay, and inadequate number of viral copies(<138 copies/mL). A negative result must be combined with clinical observations, patient history, and epidemiological information. The expected result is Negative.  Fact Sheet for Patients:  BloggerCourse.com  Fact Sheet for Healthcare Providers:  SeriousBroker.it  This test is no t yet approved or cleared by the [place] FDA and  has been authorized for detection and/or diagnosis of SARS-CoV-2 by FDA under an Emergency Use Authorization (EUA). This EUA will remain  in effect (meaning this test can be used) for the duration of the COVID-19 declaration under Section 564(b)(1) of the Act, 21 U.S.C.section 360bbb-3(b)(1), unless the authorization is terminated  or revoked sooner.       Influenza A by PCR  NEGATIVE NEGATIVE Final   Influenza B by PCR NEGATIVE NEGATIVE Final    Comment: (NOTE) The Xpert Xpress SARS-CoV-2/FLU/RSV plus assay is intended as an aid in the diagnosis of influenza from Nasopharyngeal swab specimens and should not be used as a sole basis for treatment. Nasal washings and aspirates are unacceptable for Xpert Xpress SARS-CoV-2/FLU/RSV testing.  Fact Sheet for Patients: BloggerCourse.com  Fact Sheet for Healthcare Providers: SeriousBroker.it  This test is not yet approved or cleared by the [place] FDA and has been authorized for detection and/or diagnosis of SARS-CoV-2 by FDA under an Emergency Use Authorization (EUA). This EUA will remain in effect (meaning this test can be used) for the duration of the COVID-19 declaration under Section 564(b)(1) of the Act, 21 U.S.C. section 360bbb-3(b)(1), unless the authorization is terminated or revoked.     Resp Syncytial Virus by PCR NEGATIVE NEGATIVE Final    Comment: (NOTE) Fact Sheet for Patients: BloggerCourse.com  Fact Sheet for Healthcare Providers: SeriousBroker.it  This test is not yet approved or cleared by the [place] FDA and has been authorized for detection and/or diagnosis of SARS-CoV-2 by FDA under an Emergency Use Authorization (EUA). This EUA will remain in effect (meaning this test can be used) for the duration of the COVID-19 declaration under Section 564(b)(1) of the Act, 21 U.S.C. section 360bbb-3(b)(1), unless the authorization is terminated or revoked.  Performed at [facility], [address], [place], [place] [address]   Culture, blood (routine x 2)     Status: None   Collection Time: 11/25/20  8:10 PM   Specimen: BLOOD  Result Value Ref Range Status   Specimen Description BLOOD RIGHT ANTECUBITAL  Final   Special Requests   Final    BOTTLES DRAWN AEROBIC AND ANAEROBIC  Blood Culture results may not be optimal due to an inadequate volume of blood received in culture bottles   Culture   Final    NO GROWTH 5 DAYS Performed at [facility], [address], [place], [place] [address]    Report Status 11/30/2020 FINAL  Final  Culture, blood (routine x 2)     Status: None   Collection Time: 11/25/20  8:10 PM   Specimen: BLOOD RIGHT FOREARM  Result Value Ref Range Status   Specimen Description BLOOD RIGHT FOREARM  Final   Special Requests   Final    BOTTLES DRAWN AEROBIC AND ANAEROBIC Blood Culture adequate volume   Culture   Final    NO GROWTH 5 DAYS Performed at [facility], [address] [place], [place] [address]    Report Status 11/30/2020 FINAL  Final  MRSA PCR Screening     Status: None   Collection Time: 11/26/20  9:18 PM   Specimen: Nasal Mucosa; Nasopharyngeal  Result Value Ref Range Status   MRSA by PCR NEGATIVE NEGATIVE Final    Comment:        The GeneXpert MRSA Assay (FDA approved for NASAL specimens only), is one component of a comprehensive MRSA colonization surveillance program. It is not intended to diagnose MRSA infection nor to guide or monitor treatment for MRSA infections. Performed at [facility], [place] [address], [place], [place] [address]   Culture, respiratory (tracheal aspirate)     Status: None   Collection Time: 11/27/20  5:48 PM   Specimen: Tracheal Aspirate; Respiratory  Result Value Ref Range Status   Specimen Description TRACHEAL ASPIRATE  Final   Special Requests NONE  Final   Gram Stain   Final    NO WBC SEEN FEW YEAST Performed at [facility], [place] [address] [place], [place] [address]    Culture MODERATE CANDIDA ALBICANS  Final   Report Status 11/29/2020 FINAL  Final  Culture, blood (routine x 2)     Status: None   Collection Time: 11/30/20  9:18 PM   Specimen: BLOOD  Result Value Ref Range Status   Specimen Description BLOOD RIGHT ANTECUBITAL  Final   Special Requests    Final    BOTTLES DRAWN AEROBIC AND ANAEROBIC Blood Culture adequate volume   Culture   Final    NO GROWTH 5 DAYS Performed at [facility], [place] [address] [place], [place] [address]    Report Status 12/05/2020 FINAL  Final  Culture, blood (routine x 2)     Status: None (Preliminary result)   Collection Time: 12/01/20 12:25 AM   Specimen: BLOOD  Result Value Ref Range Status   Specimen Description BLOOD RIGHT ANTECUBITAL  Final   Special Requests   Final    BOTTLES DRAWN AEROBIC ONLY Blood Culture adequate volume   Culture   Final    NO GROWTH 4 DAYS Performed at [facility], [place] [address], [place],  [address]    Report Status PENDING  Incomplete         Radiology Studies: DG Swallowing Func-Speech Pathology  Result Date: 12/04/2020 Objective Swallowing Evaluation: Type of Study: MBS-Modified Barium Swallow Study  Patient Details Name: TNISHA MAUTHE MRN: [id number] Date of Birth: 11-Jan-1941 Today's Date: 12/04/2020 Time: SLP Start Time (ACUTE ONLY): [id number] -SLP Stop Time (ACUTE ONLY): 1257 SLP Time Calculation (min) (ACUTE ONLY): 21 min Past Medical History: Past Medical History: Diagnosis Date . Allergy  . Asthma   sees Dr. Tiajuana Amass - as child per pt . At high risk for falls   unstable gait  . Ataxia  . Cataract   removed bilaterally  . Diverticulosis  . Emphysema  . Gait abnormality 05/14/2013  Ms.Mcmullin a patient of Dr. Linda Hedges presented first interpreter thousand 13 with a gait dysfunction, she also had an episodic confusion and Loss device. Exam found a mild nystagmus and she was referred to  ophthalmology on 03-2812, brain MRI was normal nystagmus was a secondary diagnosis to vestibulitis, ear nose and throat has followed and had seen the patient. At the time of the nystagmus the patie . GERD (gastroesophageal reflux disease)  . Hepatic steatosis  . Hyperlipidemia   on atorvastatin  . Hypertension   on losartan  . Iron deficiency anemia, unspecified  . Kidney cysts  11/16/12  small cyst on left . Nystagmus, end-position  . Osteopenia  . Renal cyst  . Stroke ([place]) 01/2017  mild  Past Surgical History: Past Surgical History: Procedure Laterality Date . ANAL RECTAL MANOMETRY N/A 12/20/2017  Procedure: ANO RECTAL MANOMETRY;  Surgeon: Mauri Pole, MD;  Location: [facility] ENDOSCOPY;  Service: Endoscopy;  Laterality: N/A; . BREAST BIOPSY Bilateral 1970's . CATARACT EXTRACTION Bilateral LT:02/14/13,RT:02/21/13 . CERVICAL SPINE SURGERY  2010 . COLONOSCOPY  03-12-14  per Dr. Olevia Perches, diverticulosis only, repeat 5 yrs  . EYE SURGERY Bilateral 01/2013&02/2013  left then right . HERNIA REPAIR  Oct '14-left, remote-right  years ago right; left done '14 . SKIN CANCER EXCISION  05/13/13  FACE, basal cell . TONSILLECTOMY   HPI: 80 yo female admitted 11/25/20 after becoming unresponsive. [name], unable to use RUE. Intubated in ED. 2 minutes of CPR. MRI Multiple foci of susceptibility artifact in the bilateral cerebral hemispheres more pronounced in occipital region. Remote lacunar infarct in the right basal ganglia region, PMH: CVA (2018), HTN, HLD, gait abnormality, anemia, emphysema, asthma, GERD, diverticulosis, renal cyst, osteopenia. ETT 1/5-8/22  Subjective: Pt awake, follows directions. Perseverative on calling out for [name] (spouse) Assessment / Plan / Recommendation CHL IP CLINICAL IMPRESSIONS 12/04/2020 Clinical Impression Pt exhibits an acute dysphagia on chronic cervical anomalies resulting in laryngeal penetration, brief aspiration and pharyngeal residue. Cervical vertebrae is significantly kyphotic with noted cervical hardward (2010). Despite intermittently reduced tongue base retraction and incomplete epiglottic deflection (horizontal position), she is able to protect airway during the swallow in majority of trias. The cohesiveness of bolus with mistimed onset is affected allowing penetration before swallow to vocal cords fallling below briefly. Aggregation of nectar thick to  pyriform sinuses with barium squeezed into vestible and below cords briefly. Her chin tuck posture limited cervical ROM. There was insufficient sensation with penetration episodes. Residue was mildly increased with honey thick but not significant and resulted in more timely swallow through pharynx. There is a prominent cricopharyngeus muscle. Recommend Dys 2, honey thick liquids, multiple swallows and intermittent throat clear and crushed meds. SLP Visit Diagnosis Dysphagia, pharyngeal phase (R13.13) Attention and concentration deficit following -- Frontal lobe and executive function deficit following -- Impact on safety and function Moderate aspiration risk   CHL IP TREATMENT RECOMMENDATION 12/04/2020 Treatment Recommendations Therapy as outlined in treatment plan below   Prognosis 12/04/2020 Prognosis for Safe Diet Advancement (No Data) Barriers to Reach Goals (No Data) Barriers/Prognosis Comment -- CHL IP DIET RECOMMENDATION 12/04/2020 SLP Diet Recommendations Dysphagia 2 (Fine chop) solids;Honey thick liquids Liquid Administration via Cup;No straw Medication Administration Crushed with puree Compensations Slow rate;Small sips/bites;Multiple dry swallows after each bite/sip;Clear throat intermittently Postural Changes Seated upright at 90 degrees   CHL IP OTHER RECOMMENDATIONS 12/04/2020 Recommended Consults -- Oral Care Recommendations Oral care BID Other Recommendations --   CHL IP FOLLOW UP RECOMMENDATIONS 12/04/2020 Follow up Recommendations Inpatient Rehab   CHL IP FREQUENCY AND DURATION 12/04/2020 Speech Therapy Frequency (ACUTE ONLY) min 2x/week Treatment Duration 2 weeks      CHL IP ORAL PHASE 12/04/2020 Oral Phase Impaired Oral -  Pudding Teaspoon -- Oral - Pudding Cup -- Oral - Honey Teaspoon -- Oral - Honey Cup -- Oral - Nectar Teaspoon Lingual/palatal residue Oral - Nectar Cup Lingual/palatal residue Oral - Nectar Straw -- Oral - Thin Teaspoon Decreased bolus cohesion Oral - Thin Cup Decreased bolus cohesion  Oral - Thin Straw -- Oral - Puree -- Oral - Mech Soft Reduced posterior propulsion Oral - Regular -- Oral - Multi-Consistency -- Oral - Pill -- Oral Phase - Comment --  CHL IP PHARYNGEAL PHASE 12/04/2020 Pharyngeal Phase Impaired Pharyngeal- Pudding Teaspoon -- Pharyngeal -- Pharyngeal- Pudding Cup -- Pharyngeal -- Pharyngeal- Honey Teaspoon -- Pharyngeal -- Pharyngeal- Honey Cup Pharyngeal residue - pyriform;Pharyngeal residue - valleculae;Reduced epiglottic inversion;Reduced pharyngeal peristalsis Pharyngeal -- Pharyngeal- Nectar Teaspoon Reduced epiglottic inversion;Reduced pharyngeal peristalsis;Delayed swallow initiation-pyriform sinuses;Reduced tongue base retraction Pharyngeal Material does not enter airway Pharyngeal- Nectar Cup Pharyngeal residue - valleculae;Penetration/Aspiration during swallow;Pharyngeal residue - pyriform;Reduced pharyngeal peristalsis;Reduced tongue base retraction;Reduced epiglottic inversion Pharyngeal Material enters airway, passes BELOW cords then ejected out;Material enters airway, CONTACTS cords and not ejected out Pharyngeal- Nectar Straw -- Pharyngeal -- Pharyngeal- Thin Teaspoon Reduced epiglottic inversion;Reduced pharyngeal peristalsis Pharyngeal -- Pharyngeal- Thin Cup Reduced pharyngeal peristalsis;Reduced epiglottic inversion;Penetration/Aspiration before swallow Pharyngeal Material enters airway, passes BELOW cords then ejected out;Material enters airway, CONTACTS cords and not ejected out Pharyngeal- Thin Straw -- Pharyngeal -- Pharyngeal- Puree -- Pharyngeal -- Pharyngeal- Mechanical Soft Penetration/Aspiration during swallow;Reduced epiglottic inversion Pharyngeal -- Pharyngeal- Regular -- Pharyngeal -- Pharyngeal- Multi-consistency -- Pharyngeal -- Pharyngeal- Pill -- Pharyngeal -- Pharyngeal Comment --  CHL IP CERVICAL ESOPHAGEAL PHASE 12/04/2020 Cervical Esophageal Phase Impaired Pudding Teaspoon -- Pudding Cup -- Honey Teaspoon -- Honey Cup -- Nectar Teaspoon --  Nectar Cup -- Nectar Straw -- Thin Teaspoon -- Thin Cup -- Thin Straw -- Puree -- Mechanical Soft -- Regular -- Multi-consistency -- Pill -- Cervical Esophageal Comment -- [name] 12/04/2020, 2:25 PM Orbie Pyo Litaker M.Ed Actor Pager [phone] Office [phone]                   Scheduled Meds: . apixaban  5 mg Oral BID  . [START ON 12/06/2020] atorvastatin  10 mg Oral Daily  . chlorhexidine  15 mL Mouth Rinse BID  . Chlorhexidine Gluconate Cloth  6 each Topical Daily  . insulin aspart  0-9 Units Subcutaneous Q4H  . levETIRAcetam  500 mg Oral BID  . mouth rinse  15 mL Mouth Rinse q12n4p  . metoprolol tartrate  25 mg Oral BID  . [START ON 12/06/2020] multivitamin with minerals  1 tablet Oral Daily   Continuous Infusions: . sodium chloride Stopped (12/01/20 0634)     LOS: 10 days    Time spent: 32 minutes    [name] Merino, MD [facility] Pager [phone]

## 2020-12-06 DIAGNOSIS — G40409 Other generalized epilepsy and epileptic syndromes, not intractable, without status epilepticus: Secondary | ICD-10-CM | POA: Diagnosis not present

## 2020-12-06 LAB — CBC WITH DIFFERENTIAL/PLATELET
Abs Immature Granulocytes: 0.5 10*3/uL — ABNORMAL HIGH (ref 0.00–0.07)
Basophils Absolute: 0.1 10*3/uL (ref 0.0–0.1)
Basophils Relative: 1 %
Eosinophils Absolute: 0.4 10*3/uL (ref 0.0–0.5)
Eosinophils Relative: 2 %
HCT: 25.7 % — ABNORMAL LOW (ref 36.0–46.0)
Hemoglobin: 8.6 g/dL — ABNORMAL LOW (ref 12.0–15.0)
Immature Granulocytes: 3 %
Lymphocytes Relative: 8 %
Lymphs Abs: 1.4 10*3/uL (ref 0.7–4.0)
MCH: 30.8 pg (ref 26.0–34.0)
MCHC: 33.5 g/dL (ref 30.0–36.0)
MCV: 92.1 fL (ref 80.0–100.0)
Monocytes Absolute: 1.3 10*3/uL — ABNORMAL HIGH (ref 0.1–1.0)
Monocytes Relative: 8 %
Neutro Abs: 13.3 10*3/uL — ABNORMAL HIGH (ref 1.7–7.7)
Neutrophils Relative %: 78 %
Platelets: 390 10*3/uL (ref 150–400)
RBC: 2.79 MIL/uL — ABNORMAL LOW (ref 3.87–5.11)
RDW: 14 % (ref 11.5–15.5)
WBC: 16.8 10*3/uL — ABNORMAL HIGH (ref 4.0–10.5)
nRBC: 0 % (ref 0.0–0.2)

## 2020-12-06 LAB — GLUCOSE, CAPILLARY
Glucose-Capillary: 93 mg/dL (ref 70–99)
Glucose-Capillary: 112 mg/dL — ABNORMAL HIGH (ref 70–99)
Glucose-Capillary: 117 mg/dL — ABNORMAL HIGH (ref 70–99)
Glucose-Capillary: 119 mg/dL — ABNORMAL HIGH (ref 70–99)
Glucose-Capillary: 108 mg/dL — ABNORMAL HIGH (ref 70–99)

## 2020-12-06 LAB — MAGNESIUM: Magnesium: 1.7 mg/dL (ref 1.7–2.4)

## 2020-12-06 LAB — COMPREHENSIVE METABOLIC PANEL
ALT: 103 U/L — ABNORMAL HIGH (ref 0–44)
AST: 69 U/L — ABNORMAL HIGH (ref 15–41)
Albumin: 2 g/dL — ABNORMAL LOW (ref 3.5–5.0)
Alkaline Phosphatase: 107 U/L (ref 38–126)
Anion gap: 10 (ref 5–15)
BUN: 16 mg/dL (ref 8–23)
CO2: 27 mmol/L (ref 22–32)
Calcium: 8.5 mg/dL — ABNORMAL LOW (ref 8.9–10.3)
Chloride: 95 mmol/L — ABNORMAL LOW (ref 98–111)
Creatinine, Ser: 0.49 mg/dL (ref 0.44–1.00)
GFR, Estimated: >60 mL/min (ref >60)
Glucose, Bld: 120 mg/dL — ABNORMAL HIGH (ref 70–99)
Potassium: 4.3 mmol/L (ref 3.5–5.1)
Sodium: 132 mmol/L — ABNORMAL LOW (ref 135–145)
Total Bilirubin: 1 mg/dL (ref 0.3–1.2)
Total Protein: 5.6 g/dL — ABNORMAL LOW (ref 6.5–8.1)

## 2020-12-06 LAB — PHOSPHORUS: Phosphorus: 3.7 mg/dL (ref 2.5–4.6)

## 2020-12-06 LAB — CULTURE, BLOOD (ROUTINE X 2)
Culture: NO GROWTH
Special Requests: ADEQUATE

## 2020-12-06 NOTE — Progress Notes (Signed)
PROGRESS NOTE    Kristine Francis  NTZ:[id number] DOB: 11/07/1941 DOA: 11/25/2020 PCP: [name], MD    Brief Narrative:  80 year old female with history of COPD, history of stroke, hypertension and neuropathy with balance issues brought to the ER with right-sided gaze deviation with aphasia.  She was at home, her granddaughter heard her scream and found patient bent over a table.  She was brought to the ER as a code stroke.  While in the emergency room she had a witnessed seizure.  Became bradycardic and then lost a pulse.  She was intubated in the ER, CPR for 2 minutes with ROSC.  Treated with Ativan and Keppra.  Neurology was consulted in the ER and recommended to continue [place]. 1/5-1/8, intubated in the ICU, delirium. 1/11, transferred to [facility] service. Passed swallow evaluation , coretrak to be removed 1/15   Assessment & Plan:   Principal Problem:   Grand mal seizure [facility]) Active Problems:   ANEMIA-IRON DEFICIENCY   ATAXIA   Acute encephalopathy   Cardiac arrest ([place])   Malnutrition of moderate degree   COPD (chronic obstructive pulmonary disease) ([place])  Grand mal seizure: Witnessed on admission MRI brain without any acute findings EEG with moderate to severe diffuse encephalopathy Treated with IV Keppra now transitioned to oral Keppra.  No more seizures now.  Will need neurology follow-up after discharge.  Acute metabolic encephalopathy: Suspected postictal state.  She is currently alert awake and oriented.  Mental status improved.  No focal deficits.  Passed swallow evaluation.  Started on dysphagia diet.    Encourage oral feeding.  Aspiration precautions.  Dysphagia/aspiration pneumonia/acute hypoxemic respiratory failure: Treated with 5 days of antibiotics.  Currently on room air.  She has some leukocytosis but no fever.  All-time aspiration precautions.  Cardiac arrest: PEA arrest secondary to seizure.  Echocardiogram was normal.  Grade 2 diastolic  dysfunction.  Paroxysmal A. fib: Rate controlled.  Started on Eliquis.  On metoprolol.  TSH is normal.   Ataxia/neuropathy: Chronic problem.  B12 normal.  MRI with no acute findings.  Aggressive rehab and outpatient follow-up.  DVT prophylaxis: SCDs Start: 11/25/20 1747 apixaban (ELIQUIS) tablet 5 mg   Code Status: Full code Family Communication: Husband at the bedside, son on the phone 1/15. Disposition Plan: Status is: Inpatient  Remains inpatient appropriate because:Inpatient level of care appropriate due to severity of illness   Dispo: The patient is from: Home              Anticipated d/c is to: CIR              Anticipated d/c date is: 2 days              Patient currently is medically stable when bed is available.         Consultants:   Neurology  PCCM  Procedures:   None  Antimicrobials:   Completed 5 days of Rocephin   Subjective: Patient seen and examined.  No overnight events.  Patient was happy to be able to eat her breakfast.  Feels hungry. Neuropathy is the main problem.  Objective: Vitals:   12/05/20 1106 12/05/20 1937 12/05/20 2337 12/06/20 0337  BP: 125/65 133/89 134/69 (!) 146/81  Pulse: 75 73  63  Resp: 16 11 12 11   Temp: 98.3 F (36.8 C) 98.2 F (36.8 C) 98.4 F (36.9 C) 98.2 F (36.8 C)  TempSrc:  Oral Oral Oral  SpO2: 93% 100%  94%  Weight:  Height:        Intake/Output Summary (Last 24 hours) at 12/06/2020 1113 Last data filed at 12/05/2020 2034 Gross per 24 hour  Intake -  Output 1000 ml  Net -1000 ml   Filed Weights   12/03/20 0454 12/04/20 0352 12/05/20 0500  Weight: 48.2 kg 47.9 kg 48.5 kg    Examination:  General exam: Appears calm and comfortable  Frail and debilitated.  On room air.  Chronically sick looking. Respiratory system: Clear to auscultation. Respiratory effort normal.  No added sounds. Cardiovascular system: S1 & S2 heard, RRR.  No edema. Gastrointestinal system: Abdomen is nondistended, soft  and nontender. No organomegaly or masses felt. Normal bowel sounds heard. Central nervous system: Alert and oriented. No focal neurological deficits.  Generalized weakness. Extremities: Symmetric 5 x 5 power.  Generalized weakness.     Data Reviewed: I have personally reviewed following labs and imaging studies  CBC: Recent Labs  Lab 12/02/20 0332 12/03/20 0308 12/04/20 0417 12/05/20 0201 12/06/20 0339  WBC 10.9* 13.4* 14.6* 15.2* 16.8*  NEUTROABS 9.2*  --   --   --  13.3*  HGB 10.7* 12.5 11.1* 8.6* 8.6*  HCT 32.7* 38.4 33.1* 26.8* 25.7*  MCV 92.4 92.5 91.7 93.7 92.1  PLT 218 276 341 322 [id number]   Basic Metabolic Panel: Recent Labs  Lab 11/30/20 0125 11/30/20 1208 12/01/20 0025 12/02/20 0332 12/04/20 0417 12/05/20 0201 12/06/20 0339  NA  --    < > 142 140 135 134* 132*  K 4.2   < > 3.1* 3.9 4.5 4.4 4.3  CL  --    < > 106 103 95* 95* 95*  CO2  --    < > 27 27 29 31 27   GLUCOSE  --    < > 257* 145* 130* 149* 120*  BUN  --    < > 14 10 25* 25* 16  CREATININE  --    < > 0.73 0.50 0.53 0.53 0.49  CALCIUM  --    < > 8.6* 8.1* 9.2 8.8* 8.5*  MG 1.7  --   --  1.7  --   --  1.7  PHOS 2.7  --  1.8* 2.7  --   --  3.7   < > = values in this interval not displayed.   GFR: Estimated Creatinine Clearance: 43.7 mL/min (by C-G formula based on SCr of 0.49 mg/dL). Liver Function Tests: Recent Labs  Lab 12/01/20 0025 12/06/20 0339  AST  --  69*  ALT  --  103*  ALKPHOS  --  107  BILITOT  --  1.0  PROT  --  5.6*  ALBUMIN 2.6* 2.0*   No results for input(s): LIPASE, AMYLASE in the last 168 hours. No results for input(s): AMMONIA in the last 168 hours. Coagulation Profile: No results for input(s): INR, PROTIME in the last 168 hours. Cardiac Enzymes: No results for input(s): CKTOTAL, CKMB, CKMBINDEX, TROPONINI in the last 168 hours. BNP (last 3 results) No results for input(s): PROBNP in the last 8760 hours. HbA1C: No results for input(s): HGBA1C in the last 72  hours. CBG: Recent Labs  Lab 12/05/20 1552 12/05/20 1952 12/05/20 2343 12/06/20 0352 12/06/20 0823  GLUCAP 130* 111* 117* 93 112*   Lipid Profile: No results for input(s): CHOL, HDL, LDLCALC, TRIG, CHOLHDL, LDLDIRECT in the last 72 hours. Thyroid Function Tests: Recent Labs    12/05/20 0201  TSH 6.413*  FREET4 0.96   Anemia Panel: No results for  input(s): VITAMINB12, FOLATE, FERRITIN, TIBC, IRON, RETICCTPCT in the last 72 hours. Sepsis Labs: No results for input(s): PROCALCITON, LATICACIDVEN in the last 168 hours.  Recent Results (from the past 240 hour(s))  MRSA PCR Screening     Status: None   Collection Time: 11/26/20  9:18 PM   Specimen: Nasal Mucosa; Nasopharyngeal  Result Value Ref Range Status   MRSA by PCR NEGATIVE NEGATIVE Final    Comment:        The GeneXpert MRSA Assay (FDA approved for NASAL specimens only), is one component of a comprehensive MRSA colonization surveillance program. It is not intended to diagnose MRSA infection nor to guide or monitor treatment for MRSA infections. Performed at [facility], [place] [address] [place], [place] [address]   Culture, respiratory (tracheal aspirate)     Status: None   Collection Time: 11/27/20  5:48 PM   Specimen: Tracheal Aspirate; Respiratory  Result Value Ref Range Status   Specimen Description TRACHEAL ASPIRATE  Final   Special Requests NONE  Final   Gram Stain   Final    NO WBC SEEN FEW YEAST Performed at [facility], [place] [address], [place], [place] [address]    Culture MODERATE CANDIDA ALBICANS  Final   Report Status 11/29/2020 FINAL  Final  Culture, blood (routine x 2)     Status: None   Collection Time: 11/30/20  9:18 PM   Specimen: BLOOD  Result Value Ref Range Status   Specimen Description BLOOD RIGHT ANTECUBITAL  Final   Special Requests   Final    BOTTLES DRAWN AEROBIC AND ANAEROBIC Blood Culture adequate volume   Culture   Final    NO GROWTH 5 DAYS Performed at [facility], [place] [address], [place], [place] [address]    Report Status 12/05/2020 FINAL  Final  Culture, blood (routine x 2)     Status: None   Collection Time: 12/01/20 12:25 AM   Specimen: BLOOD  Result Value Ref Range Status   Specimen Description BLOOD RIGHT ANTECUBITAL  Final   Special Requests   Final    BOTTLES DRAWN AEROBIC ONLY Blood Culture adequate volume   Culture   Final    NO GROWTH 5 DAYS Performed at [facility], [place] [address], [place], [place] [address]    Report Status 12/06/2020 FINAL  Final         Radiology Studies: DG Swallowing Func-Speech Pathology  Result Date: 12/04/2020 Objective Swallowing Evaluation: Type of Study: MBS-Modified Barium Swallow Study  Patient Details Name: ZAVIA PULLEN MRN: [id number] Date of Birth: 07/26/1941 Today's Date: 12/04/2020 Time: SLP Start Time (ACUTE ONLY): 9379 -SLP Stop Time (ACUTE ONLY): 1257 SLP Time Calculation (min) (ACUTE ONLY): 21 min Past Medical History: Past Medical History: Diagnosis Date . Allergy  . Asthma   sees Dr. Tiajuana Amass - as child per pt . At high risk for falls   unstable gait  . Ataxia  . Cataract   removed bilaterally  . Diverticulosis  . Emphysema  . Gait abnormality 05/14/2013  Ms.Anspach a patient of Dr. Linda Hedges presented first interpreter thousand 13 with a gait dysfunction, she also had an episodic confusion and Loss device. Exam found a mild nystagmus and she was referred to ophthalmology on 03-2812, brain MRI was normal nystagmus was a secondary diagnosis to vestibulitis, ear nose and throat has followed and had seen the patient. At the time of the nystagmus the patie . GERD (gastroesophageal  reflux disease)  . Hepatic steatosis  . Hyperlipidemia   on atorvastatin  . Hypertension   on losartan  . Iron deficiency anemia, unspecified  . Kidney cysts 11/16/12  small cyst on left . Nystagmus, end-position  . Osteopenia  . Renal cyst  . Stroke ([place]) 01/2017  mild  Past Surgical History: Past Surgical  History: Procedure Laterality Date . ANAL RECTAL MANOMETRY N/A 12/20/2017  Procedure: ANO RECTAL MANOMETRY;  Surgeon: Mauri Pole, MD;  Location: [facility] ENDOSCOPY;  Service: Endoscopy;  Laterality: N/A; . BREAST BIOPSY Bilateral 1970's . CATARACT EXTRACTION Bilateral LT:02/14/13,RT:02/21/13 . CERVICAL SPINE SURGERY  2010 . COLONOSCOPY  03-12-14  per Dr. Olevia Perches, diverticulosis only, repeat 5 yrs  . EYE SURGERY Bilateral 01/2013&02/2013  left then right . HERNIA REPAIR  Oct '14-left, remote-right  years ago right; left done '14 . SKIN CANCER EXCISION  05/13/13  FACE, basal cell . TONSILLECTOMY   HPI: 80 yo female admitted 11/25/20 after becoming unresponsive. [name], unable to use RUE. Intubated in ED. 2 minutes of CPR. MRI Multiple foci of susceptibility artifact in the bilateral cerebral hemispheres more pronounced in occipital region. Remote lacunar infarct in the right basal ganglia region, PMH: CVA (2018), HTN, HLD, gait abnormality, anemia, emphysema, asthma, GERD, diverticulosis, renal cyst, osteopenia. ETT 1/5-8/22  Subjective: Pt awake, follows directions. Perseverative on calling out for [name] (spouse) Assessment / Plan / Recommendation CHL IP CLINICAL IMPRESSIONS 12/04/2020 Clinical Impression Pt exhibits an acute dysphagia on chronic cervical anomalies resulting in laryngeal penetration, brief aspiration and pharyngeal residue. Cervical vertebrae is significantly kyphotic with noted cervical hardward (2010). Despite intermittently reduced tongue base retraction and incomplete epiglottic deflection (horizontal position), she is able to protect airway during the swallow in majority of trias. The cohesiveness of bolus with mistimed onset is affected allowing penetration before swallow to vocal cords fallling below briefly. Aggregation of nectar thick to pyriform sinuses with barium squeezed into vestible and below cords briefly. Her chin tuck posture limited cervical ROM. There was insufficient sensation  with penetration episodes. Residue was mildly increased with honey thick but not significant and resulted in more timely swallow through pharynx. There is a prominent cricopharyngeus muscle. Recommend Dys 2, honey thick liquids, multiple swallows and intermittent throat clear and crushed meds. SLP Visit Diagnosis Dysphagia, pharyngeal phase (R13.13) Attention and concentration deficit following -- Frontal lobe and executive function deficit following -- Impact on safety and function Moderate aspiration risk   CHL IP TREATMENT RECOMMENDATION 12/04/2020 Treatment Recommendations Therapy as outlined in treatment plan below   Prognosis 12/04/2020 Prognosis for Safe Diet Advancement (No Data) Barriers to Reach Goals (No Data) Barriers/Prognosis Comment -- CHL IP DIET RECOMMENDATION 12/04/2020 SLP Diet Recommendations Dysphagia 2 (Fine chop) solids;Honey thick liquids Liquid Administration via Cup;No straw Medication Administration Crushed with puree Compensations Slow rate;Small sips/bites;Multiple dry swallows after each bite/sip;Clear throat intermittently Postural Changes Seated upright at 90 degrees   CHL IP OTHER RECOMMENDATIONS 12/04/2020 Recommended Consults -- Oral Care Recommendations Oral care BID Other Recommendations --   CHL IP FOLLOW UP RECOMMENDATIONS 12/04/2020 Follow up Recommendations Inpatient Rehab   CHL IP FREQUENCY AND DURATION 12/04/2020 Speech Therapy Frequency (ACUTE ONLY) min 2x/week Treatment Duration 2 weeks      CHL IP ORAL PHASE 12/04/2020 Oral Phase Impaired Oral - Pudding Teaspoon -- Oral - Pudding Cup -- Oral - Honey Teaspoon -- Oral - Honey Cup -- Oral - Nectar Teaspoon Lingual/palatal residue Oral - Nectar Cup Lingual/palatal residue Oral - Nectar Straw -- Oral -  Thin Teaspoon Decreased bolus cohesion Oral - Thin Cup Decreased bolus cohesion Oral - Thin Straw -- Oral - Puree -- Oral - Mech Soft Reduced posterior propulsion Oral - Regular -- Oral - Multi-Consistency -- Oral - Pill -- Oral Phase  - Comment --  CHL IP PHARYNGEAL PHASE 12/04/2020 Pharyngeal Phase Impaired Pharyngeal- Pudding Teaspoon -- Pharyngeal -- Pharyngeal- Pudding Cup -- Pharyngeal -- Pharyngeal- Honey Teaspoon -- Pharyngeal -- Pharyngeal- Honey Cup Pharyngeal residue - pyriform;Pharyngeal residue - valleculae;Reduced epiglottic inversion;Reduced pharyngeal peristalsis Pharyngeal -- Pharyngeal- Nectar Teaspoon Reduced epiglottic inversion;Reduced pharyngeal peristalsis;Delayed swallow initiation-pyriform sinuses;Reduced tongue base retraction Pharyngeal Material does not enter airway Pharyngeal- Nectar Cup Pharyngeal residue - valleculae;Penetration/Aspiration during swallow;Pharyngeal residue - pyriform;Reduced pharyngeal peristalsis;Reduced tongue base retraction;Reduced epiglottic inversion Pharyngeal Material enters airway, passes BELOW cords then ejected out;Material enters airway, CONTACTS cords and not ejected out Pharyngeal- Nectar Straw -- Pharyngeal -- Pharyngeal- Thin Teaspoon Reduced epiglottic inversion;Reduced pharyngeal peristalsis Pharyngeal -- Pharyngeal- Thin Cup Reduced pharyngeal peristalsis;Reduced epiglottic inversion;Penetration/Aspiration before swallow Pharyngeal Material enters airway, passes BELOW cords then ejected out;Material enters airway, CONTACTS cords and not ejected out Pharyngeal- Thin Straw -- Pharyngeal -- Pharyngeal- Puree -- Pharyngeal -- Pharyngeal- Mechanical Soft Penetration/Aspiration during swallow;Reduced epiglottic inversion Pharyngeal -- Pharyngeal- Regular -- Pharyngeal -- Pharyngeal- Multi-consistency -- Pharyngeal -- Pharyngeal- Pill -- Pharyngeal -- Pharyngeal Comment --  CHL IP CERVICAL ESOPHAGEAL PHASE 12/04/2020 Cervical Esophageal Phase Impaired Pudding Teaspoon -- Pudding Cup -- Honey Teaspoon -- Honey Cup -- Nectar Teaspoon -- Nectar Cup -- Nectar Straw -- Thin Teaspoon -- Thin Cup -- Thin Straw -- Puree -- Mechanical Soft -- Regular -- Multi-consistency -- Pill -- Cervical  Esophageal Comment -- [name] 12/04/2020, 2:25 PM Orbie Pyo Litaker M.Ed Actor Pager [phone] Office [phone]                   Scheduled Meds: . apixaban  5 mg Oral BID  . atorvastatin  10 mg Oral Daily  . chlorhexidine  15 mL Mouth Rinse BID  . Chlorhexidine Gluconate Cloth  6 each Topical Daily  . insulin aspart  0-9 Units Subcutaneous Q4H  . levETIRAcetam  500 mg Oral BID  . mouth rinse  15 mL Mouth Rinse q12n4p  . metoprolol tartrate  25 mg Oral BID  . multivitamin with minerals  1 tablet Oral Daily   Continuous Infusions: . sodium chloride Stopped (12/01/20 0634)     LOS: 11 days    Time spent: 32 minutes    [name] Merino, MD [facility] Pager [phone]

## 2020-12-06 NOTE — PMR Pre-admission (Signed)
PMR Admission Coordinator Pre-Admission Assessment  Patient: Kristine Francis is an 80 y.o., female MRN: [id number] DOB: 1941/02/09 Height: '5\' 1"'  (154.9 cm) Weight: 47.6 kg  Insurance Information HMO:    PPO:      PCP:      IPA:      80/20:      OTHER:  PRIMARY: Medicare Part A and B      Policy#: [id number]    Subscriber: pt Benefits:  Phone #: passport one source verified on 11/21/20    Name: 12/23 Eff. Date:04/22/2007      Deduct: $1566      Out of Pocket Max: none      Life Max: none  CIR: 100%      SNF: 20 full days Outpatient: 80%     Co-Pay: 20% Home Health: 100%      Co-Pay: none DME: 80%     Co-Pay: 20% Providers: pt choice SECONDARY: None     Policy#:      Phone#:  Development worker, community:       Phone#:   The Engineer, petroleum" for patients in Inpatient Rehabilitation Facilities with attached "Privacy Act [place] Records" was provided and verbally reviewed with: Patient  Emergency Contact Information Contact Information    Name Relation Home Work Mobile   [place] Spouse [phone]  [phone]   Malaia, Buchta   [phone]      Current Medical History  Patient Admitting Diagnosis: Debility History of Present Illness:Kristine Francis is a 80 y.o. female with PMH significant for asthma, GERD, hepatic steatosis, HTN, HLD, chronic latent nystagmus who presented to ED with a seizure. Patient was at her baseline, takes care of her grand kids and lives with her husband. [place] daughter heard a loud scream and then found the patient leaning down on the table. Husband who had just gone for 20 mins, returned to find her leaning on the table and unable to move her right side. EMS was called and she was brought in as a stroke code. She was noted by EMS to have a R gaze deviation and only following one step commands and concern for potential aphasia. As soon as she came in to the ED, she was noted to have copious amounts of secretions and before I  could examine her at all, she had a GTC seizure. Patient had an ictal cry > figure of 4 sign with her R hand up > R gaze deviation and then full body rhythmic GTC. During the seizure, she went bradycardic to 40s. She was taken to resuscitation bay and intubated.  Workup with CTH and a CT Angio was obtained. CT Head was negative for a large territory infarct. She was premedicated with solumderol and Benadryl and a CT angio with contrast was obtained which was negative for LVO. Pt. Experienced  PEA arrest secondary to seizure.  Echocardiogram was normal.  Grade 2 diastolic dysfunction. Found to have  paroxysmal A. fib: Rate controlled.  Started on Eliquis.  On metoprolol. Pt. Also developed dysphagia/aspiration pneumonia/acute hypoxemic respiratory failure and was treated with 5 days of antibioticsnd is .Currently on  fine chopped diet with honey thick liquids.    Patient's medical [facility] has been reviewed by the rehabilitation admission coordinator and physician.  Past Medical History  Past Medical History:  Diagnosis Date  . Allergy   . Asthma    sees Dr. Tiajuana Amass - as child per pt  . At high  risk for falls    unstable gait   . Ataxia   . Cataract    removed bilaterally   . Diverticulosis   . Emphysema   . Gait abnormality 05/14/2013   Kristine Francis a patient of Dr. Linda Hedges presented first interpreter thousand 13 with a gait dysfunction, she also had an episodic confusion and Loss device. Exam found a mild nystagmus and she was referred to ophthalmology on 03-2812, brain MRI was normal nystagmus was a secondary diagnosis to vestibulitis, ear nose and throat has followed and had seen the patient. At the time of the nystagmus the patie  . GERD (gastroesophageal reflux disease)   . Hepatic steatosis   . Hyperlipidemia    on atorvastatin   . Hypertension    on losartan   . Iron deficiency anemia, unspecified   . Kidney cysts 11/16/12   small cyst on left  .  Nystagmus, end-position   . Osteopenia   . Renal cyst   . Stroke ([place]) 01/2017   mild     Family History   family history includes Breast cancer in her paternal aunt; Cancer - Lung in her paternal aunt; Colon cancer in her father, paternal aunt, paternal aunt, and paternal grandmother; Coronary artery disease in her father; Esophageal cancer in an other family member; Heart attack in her paternal grandfather; Heart block in her father; Ovarian cancer in her maternal aunt; Pancreatic cancer in her mother; Throat cancer in her mother.  Prior Rehab/Hospitalizations Has the patient had prior rehab or hospitalizations prior to admission? No  Has the patient had major surgery during 100 days prior to admission? Yes   Current Medications  Current Facility-Administered Medications:  .  0.9 %  sodium chloride infusion, 250 mL, Intravenous, Continuous, Sanjuan Dame, MD, Stopped at 12/01/20 [phone] .  acetaminophen (TYLENOL) 160 MG/5ML solution 650 mg, 650 mg, Oral, Q6H PRN, [name] Merino, MD .  apixaban (ELIQUIS) tablet 5 mg, 5 mg, Oral, BID, Ghimire, Kuber, MD, 5 mg at 12/10/20 0900 .  atorvastatin (LIPITOR) tablet 10 mg, 10 mg, Oral, Daily, Ghimire, Kuber, MD, 10 mg at 12/10/20 0900 .  chlorhexidine (PERIDEX) 0.12 % solution 15 mL, 15 mL, Mouth Rinse, BID, Ghimire, Kuber, MD, 15 mL at 12/09/20 2134 .  Chlorhexidine Gluconate Cloth 2 % PADS 6 each, 6 each, Topical, Daily, Sanjuan Dame, MD, 6 each at 12/07/20 1002 .  docusate (COLACE) 50 MG/5ML liquid 100 mg, 100 mg, Oral, BID PRN, Hammons, Kimberly B, RPH .  levETIRAcetam (KEPPRA) tablet 500 mg, 500 mg, Oral, BID, Ghimire, Kuber, MD, 500 mg at 12/10/20 0900 .  lip balm (CARMEX) ointment, , Topical, PRN, Sanjuan Dame, MD, Given at 11/29/20 1414 .  MEDLINE mouth rinse, 15 mL, Mouth Rinse, q12n4p, Sanjuan Dame, MD, 15 mL at 12/09/20 1730 .  metoprolol tartrate (LOPRESSOR) tablet 25 mg, 25 mg, Oral, BID, Ghimire, Kuber, MD, 25 mg at  12/10/20 0900 .  multivitamin with minerals tablet 1 tablet, 1 tablet, Oral, Daily, [name] Merino, MD, 1 tablet at 12/10/20 0900 .  polyethylene glycol (MIRALAX / GLYCOLAX) packet 17 g, 17 g, Oral, Daily PRN, [name] Merino, MD .  Resource ThickenUp Clear, , Oral, PRN, [name] Merino, MD  Patients Current Diet:  Diet Order            DIET DYS 2 Room service appropriate? No; Fluid consistency: Honey Thick  Diet effective now                 Precautions / Restrictions  Precautions Precautions: Fall,Other (comment) Precaution Comments: seizure Restrictions Weight Bearing Restrictions: No   Has the patient had 2 or more falls or a fall with injury in the past year? Yes  Prior Activity Level Community (5-7x/wk): Pt. was avtive in the community PTA  Prior Functional Level Self Care: Did the patient need help bathing, dressing, using the toilet or eating? Independent  Indoor Mobility: Did the patient need assistance with walking from room to room (with or without device)? Independent  Stairs: Did the patient need assistance with internal or external stairs (with or without device)? Needed some help  Functional Cognition: Did the patient need help planning regular tasks such as shopping or remembering to take medications? Independent  Home Assistive Devices / Equipment Home Equipment: Walker - 4 wheels,Grab bars - tub/shower  Prior Device Use: Indicate devices/aids used by the patient prior to current illness, exacerbation or injury? Walker  Current Functional Level Cognition  Overall Cognitive Status: Impaired/Different from baseline Current Attention Level: Selective Orientation Level: Oriented to time,Oriented to place,Oriented to person,Disoriented to situation Following Commands: Follows one step commands consistently,Follows multi-step commands inconsistently Safety/Judgement: Decreased awareness of safety,Decreased awareness of deficits General Comments: Repeated cues  provided throughout session. Difficulty following 2 cues at same time.    Extremity Assessment (includes Sensation/Coordination)  Upper Extremity Assessment: Generalized weakness  Lower Extremity Assessment: Generalized weakness    ADLs  Overall ADL's : Needs assistance/impaired Grooming: Wash/dry hands,Standing,Minimal assistance Grooming Details (indicate cue type and reason): MIN A for balance while standing at sink to wash hands Lower Body Bathing: Supervison/ safety,Set up,Min guard,Sitting/lateral leans Lower Body Bathing Details (indicate cue type and reason): simulated via anterior pericare from [facility] Upper Body Dressing : Moderate assistance,Sitting Lower Body Dressing: Maximal assistance,Sit to/from stand Lower Body Dressing Details (indicate cue type and reason): to don <>doff panties during toileting Toilet Transfer: Minimal assistance,RW,Stand-pivot Toilet Transfer Details (indicate cue type and reason): MIN A for stand pivot transfer from EOB<>BSC with Rw Toileting- Clothing Manipulation and Hygiene: Supervision/safety,Set up,Min guard,Sitting/lateral lean Toileting - Clothing Manipulation Details (indicate cue type and reason): anterior pericare via lateral leans on BSC Functional mobility during ADLs: Minimal assistance,Cueing for safety,Rolling walker General ADL Comments: pt continues to present with impaired balance, generalized weakness and cognitive deficits    Mobility  Overal bed mobility: Needs Assistance Bed Mobility: Supine to Sit Rolling: Min assist Supine to sit: HOB elevated,Min guard Sit to supine: Max assist General bed mobility comments: Extra time and cues to manage legs off EOB and pull on bed rail to ascend trunk, min guard assist with HOB elevated.    Transfers  Overall transfer level: Needs assistance Equipment used: Rolling walker (2 wheeled) Transfers: Sit to/from Stand Sit to Stand: Min assist Stand pivot transfers: Min assist General  transfer comment: Assist to power to standing with cues for hand placement as pt pulling up on RW initially, good carryover with subsequent reps. Stood from [employer], from chair x2.    Ambulation / Gait / Stairs / Wheelchair Mobility  Ambulation/Gait Ambulation/Gait assistance: Herbalist (Feet): 20 Feet (x3 bouts with seated rest break between each bout) Assistive device: Rolling walker (2 wheeled) Gait Pattern/deviations: Trunk flexed,Shuffle,Ataxic,Step-through pattern,Decreased stride length General Gait Details: Slow, ataxic like gait with short shuffling steps, cuing pt to take "marching" steps with tactile cues at hamstrings, success but poor carryover. Cued pt also to improve upright posture as she tends to flex at her trunk, momentary success. Unable to perform both cues  simultaneously. Good carryover with managing RW then taking steps sequencing turns. Gait velocity: reduced Gait velocity interpretation: <1.31 ft/sec, indicative of household ambulator    Posture / Balance Dynamic Sitting Balance Sitting balance - Comments: reliant on UE support Balance Overall balance assessment: Needs assistance Sitting-balance support: Feet supported,Bilateral upper extremity supported Sitting balance-Leahy Scale: Poor Sitting balance - Comments: reliant on UE support Postural control:  (anterior lean) Standing balance support: During functional activity,Bilateral upper extremity supported Standing balance-Leahy Scale: Poor Standing balance comment: BUE support on RW and external support    Special needs/care consideration Skin Abrasion to R leg; Ecchymosis to rand, arms, abdomen, Designated Visitor: [place] (from acute therapy documentation) Living Arrangements: Spouse/significant other Available Help at Discharge: Family,Available 24 hours/day Type of Home: [place]: One level Home Access: Stairs to enter Entrance Stairs-Rails:  None Entrance Stairs-Number of Steps: 1 Bathroom Shower/Tub: Chiropodist: Standard Bathroom Accessibility: Yes How Accessible: Accessible via walker [place]: No  Discharge Living Setting Plans for Discharge Living Setting: Patient's home Type of Home at Discharge: House Discharge Home Layout: One level Discharge Home Access: Stairs to enter Entrance Stairs-Rails: None Entrance Stairs-Number of Steps: 1 Discharge Bathroom Shower/Tub: Tub/shower unit Discharge Bathroom Toilet: Standard Discharge Bathroom Accessibility: Yes How Accessible: Accessible via walker Does the patient have any problems obtaining your medications?: No  Social/Family/Support Systems Patient Roles: Spouse Contact Information: [phone] Anticipated Caregiver: Myrissa Chipley (spouse) [place] [phone] also very involved in care decisions) Anticipated Caregiver's Contact Information: [phone] Ability/Limitations of Caregiver: Can provide Mod A Caregiver Availability: 24/7 Discharge Plan Discussed with Primary Caregiver: Yes Is Caregiver In Agreement with Plan?: Yes  Goals Patient/Family Goal for Rehab: PT/OT Min A Expected length of stay: 12-14 days Pt/Family Agrees to Admission and willing to participate: Yes Program Orientation Provided & Reviewed with Pt/Caregiver Including Roles  & Responsibilities: Yes  Decrease burden of Care through IP rehab admission: Specialzed equipment needs   Possible need for SNF placement upon discharge: Not anticipated  Patient Condition: I have reviewed medical records from [facility] , spoken with CM, and patient and spouse. I met with patient at the bedside for inpatient rehabilitation assessment.  Patient will benefit from ongoing PT and OT, can actively participate in 3 hours of therapy a day 5 days of the week, and can make measurable gains during the admission.  Patient will also benefit from the coordinated  team approach during an Inpatient Acute Rehabilitation admission.  The patient will receive intensive therapy as well as Rehabilitation physician, nursing, social worker, and care management interventions.  Due to bladder management, bowel management, safety, skin/wound care, disease management, medication administration, pain management and patient education the patient requires 24 hour a day rehabilitation nursing.  The patient is currently min A- mod A  with mobility and basic ADLs.  Discharge setting and therapy post discharge at home with home health is anticipated.  Patient has agreed to participate in the Acute Inpatient Rehabilitation Program and will admit today.  Preadmission Screen Completed By:  Genella Mech, 12/10/2020 9:46 AM ______________________________________________________________________   Discussed status with Dr. Dagoberto Ligas on 12/11/20 at 9:30  and received approval for admission today.  Admission Coordinator:  Genella Mech, CCC-SLP, time 930/Date 12/10/2020   Assessment/Plan: Diagnosis: 1. Does the need for close, 24 hr/day Medical supervision in concert with the patient's rehab needs make it unreasonable for this patient to be served in a less intensive setting?  Yes 2. Co-Morbidities requiring supervision/potential complications: PEA arrest, dysphagia, Seizure, HNT< HLD, hepatic steatosis, GERD 3. Due to bladder management, bowel management, safety, skin/wound care, disease management, medication administration, pain management and patient education, does the patient require 24 hr/day rehab nursing? Yes 4. Does the patient require coordinated care of a physician, rehab nurse, PT, OT, and SLP to address physical and functional deficits in the context of the above medical diagnosis(es)? Yes Addressing deficits in the following areas: balance, endurance, locomotion, strength, transferring, bathing, dressing, feeding, grooming, toileting, cognition, speech, language and  swallowing 5. Can the patient actively participate in an intensive therapy program of at least 3 hrs of therapy 5 days a week? Yes 6. The potential for patient to make measurable gains while on inpatient rehab is good 7. Anticipated functional outcomes upon discharge from inpatient rehab: min assist PT, min assist OT, min assist SLP 8. Estimated rehab length of stay to reach the above functional goals is: 12-14 days 9. Anticipated discharge destination: Home 10. Overall Rehab/Functional Prognosis: good   MD Signature:

## 2020-12-07 LAB — GLUCOSE, CAPILLARY
Glucose-Capillary: 101 mg/dL — ABNORMAL HIGH (ref 70–99)
Glucose-Capillary: 102 mg/dL — ABNORMAL HIGH (ref 70–99)
Glucose-Capillary: 93 mg/dL (ref 70–99)

## 2020-12-07 MED ORDER — METOPROLOL TARTRATE 25 MG PO TABS
25.0000 mg | ORAL_TABLET | Freq: Two times a day (BID) | ORAL | Status: DC
Start: 2020-12-07 — End: 2020-12-10
  Administered 2020-12-07 – 2020-12-10 (×7): 25 mg via ORAL
  Filled 2020-12-07 (×7): qty 1

## 2020-12-07 MED ORDER — LEVETIRACETAM 500 MG PO TABS
500.0000 mg | ORAL_TABLET | Freq: Two times a day (BID) | ORAL | Status: DC
  Administered 2020-12-07 – 2020-12-10 (×7): 500 mg via ORAL
  Filled 2020-12-07 (×7): qty 1

## 2020-12-07 NOTE — Progress Notes (Signed)
Physical Therapy Treatment Patient Details Name: SHAQUINA GILLHAM MRN: [id number] DOB: [birth year] Today's Date: 12/07/2020    History of Present Illness 80 y.o. female presenting from home initially as code stroke with LOC and R-sided gaze and aphasia. Grand mal seizure with loss of pulse in E.D. intubated on 1/5 then extubated on 1/8. Patient also found to have acute encephalopathy, dysphagia/aspiration pneumonia and acute hypoxic respiratory failure.    PT Comments    Patient progressing slowly towards PT goals. Requires less assist to stand from all surfaces today. Improved ambulation distance with Min A and use of RW for support as well a close chair follow as pt fatigues easily. Noted to have ataxic like gait needing support for balance. Reports dizziness worsened with movement; provided cues for gaze stabilization. Sp02 remained >93% on RA with activity. Will continue to follow.   Follow Up Recommendations  SNF     Equipment Recommendations  Rolling walker with 5" wheels;Wheelchair (measurements PT)    Recommendations for Other Services       Precautions / Restrictions Precautions Precautions: Fall;Other (comment) Precaution Comments: seizure Restrictions Weight Bearing Restrictions: No    Mobility  Bed Mobility Overal bed mobility: Needs Assistance Bed Mobility: Supine to Sit     Supine to sit: Min assist;HOB elevated     General bed mobility comments: Step by step cues for technique; assist with trunk to get to EOB. + dizziness.  Transfers Overall transfer level: Needs assistance Equipment used: Rolling walker (2 wheeled) Transfers: Sit to/from Stand Sit to Stand: Min assist         General transfer comment: ASsist to power to standing with cues for hand placement as pt pulling up on RW. Stood from [employer], from chair x1. + dizziness in standing.  Ambulation/Gait Ambulation/Gait assistance: Min assist Gait Distance (Feet): 8 Feet (+12') Assistive device:  Rolling walker (2 wheeled) Gait Pattern/deviations: Step-to pattern;Trunk flexed;Shuffle;Ataxic Gait velocity: reduced Gait velocity interpretation: <1.31 ft/sec, indicative of household ambulator General Gait Details: Slow, ataxic like gait with short shuffling steps; assist for RW management/proximity as pt with tendency to have RW too far anterior. 1 seated rest break. Cues for gaze stabilization.   Stairs             Wheelchair Mobility    Modified Rankin (Stroke Patients Only)       Balance Overall balance assessment: Needs assistance Sitting-balance support: Feet supported;Bilateral upper extremity supported Sitting balance-Leahy Scale: Poor Sitting balance - Comments: reliant on UE support Postural control:  (anterior lean) Standing balance support: During functional activity;Bilateral upper extremity supported Standing balance-Leahy Scale: Poor Standing balance comment: BUE support on RW and external support                            Cognition Arousal/Alertness: Awake/alert Behavior During Therapy: WFL for tasks assessed/performed Overall Cognitive Status: Impaired/Different from baseline Area of Impairment: Attention;Memory;Following commands;Safety/judgement;Awareness;Problem solving                   Current Attention Level: Selective Memory: Decreased recall of precautions;Decreased short-term memory Following Commands: Follows one step commands consistently Safety/Judgement: Decreased awareness of safety;Decreased awareness of deficits Awareness: Emergent Problem Solving: Slow processing;Requires verbal cues        Exercises      General Comments General comments (skin integrity, edema, etc.): Sp02 remained >93% on RA with activity      Pertinent Vitals/Pain Pain Assessment: Faces Faces Pain Scale:  No hurt    Home Living                      Prior Function            PT Goals (current goals can now be found in  the care plan section) Progress towards PT goals: Progressing toward goals    Frequency    Min 3X/week      PT Plan Current plan remains appropriate    Co-evaluation              AM-PAC PT "6 Clicks" Mobility   Outcome Measure  Help needed turning from your back to your side while in a flat bed without using bedrails?: A Little Help needed moving from lying on your back to sitting on the side of a flat bed without using bedrails?: A Little Help needed moving to and from a bed to a chair (including a wheelchair)?: A Little Help needed standing up from a chair using your arms (e.g., wheelchair or bedside chair)?: A Little Help needed to walk in hospital room?: A Lot Help needed climbing 3-5 steps with a railing? : A Lot 6 Click Score: 16    End of Session Equipment Utilized During Treatment: Gait belt Activity Tolerance: Patient tolerated treatment well;Patient limited by fatigue Patient left: in chair;with call bell/phone within reach;with chair alarm set Nurse Communication: Mobility status PT Visit Diagnosis: Other abnormalities of gait and mobility (R26.89);Muscle weakness (generalized) (M62.81);Other symptoms and signs involving the nervous system (R29.898)     Time: 1610-9604 PT Time Calculation (min) (ACUTE ONLY): 20 min  Charges:  $Gait Training: 8-22 mins                     Marisa Severin, PT, DPT Acute Rehabilitation Services Pager [phone] Office [place] 12/07/2020, 1:09 PM

## 2020-12-07 NOTE — Progress Notes (Signed)
PROGRESS NOTE    Kristine Francis  [id number] DOB: [birth year] DOA: 11/25/2020 PCP: [name], MD    Brief Narrative:   80 year old female with history of COPD, history of stroke, hypertension and neuropathy with balance issues brought to the ER with right-sided gaze deviation with aphasia.  She was at home, her granddaughter heard her scream and found patient bent over a table. She was brought to the ER as a code stroke.  While in the emergency room she had a witnessed seizure.  Became bradycardic and then lost a pulse. She was intubated in the ER, CPR for 2 minutes with ROSC.  Treated with Ativan and Keppra.  Neurology was consulted in the ER and recommended to continue [place]. 1/5-1/8, intubated in the ICU, delirium. 1/11, transferred to [facility] service. Passed swallow evaluation , coretrak to be removed 1/15 Waiting for Rehab   Assessment & Plan:   Principal Problem:   Grand mal seizure [facility]) Active Problems:   ANEMIA-IRON DEFICIENCY   ATAXIA   Acute encephalopathy   Cardiac arrest ([place])   Malnutrition of moderate degree   COPD (chronic obstructive pulmonary disease) ([place])  Grand mal seizure: Witnessed on admission MRI brain without any acute findings EEG with moderate to severe diffuse encephalopathy Treated with IV Keppra now transitioned to oral Keppra.  No more seizures now.  Will need neurology follow-up after discharge.  Need referral on discharge.  Acute metabolic encephalopathy: Suspected postictal state.  She is currently alert awake and oriented.  Mental status improved.  No focal deficits.  Passed swallow evaluation.  Started on dysphagia diet.    Encourage oral feeding.  Aspiration precautions.  Dysphagia/aspiration pneumonia/acute hypoxemic respiratory failure: Treated with 5 days of antibiotics.  Currently on room air.  She has some leukocytosis but no fever.  All-time aspiration precautions.  Cardiac arrest: PEA arrest secondary to seizure.  Echocardiogram  was normal.  Grade 2 diastolic dysfunction.  Paroxysmal A. fib: Rate controlled.  Started on Eliquis.  On metoprolol.  TSH is normal.   Ataxia/neuropathy: Chronic problem.  B12 normal.  MRI with no acute findings.  Aggressive rehab and outpatient follow-up.  DVT prophylaxis: SCDs Start: 11/25/20 1747 apixaban (ELIQUIS) tablet 5 mg   Code Status: Full code Family Communication: none today.  Disposition Plan: Status is: Inpatient  Remains inpatient appropriate because:Inpatient level of care appropriate due to severity of illness   Dispo: The patient is from: Home              Anticipated d/c is to: CIR              Anticipated d/c date is: when bed is available.               Patient currently is medically stable when bed is available.   Consultants:   Neurology  PCCM  Procedures:   None  Antimicrobials:   Completed 5 days of Rocephin   Subjective:  seen and examined. Today no complaints. She feels hungry. No new events.    Objective: Vitals:   12/06/20 0337 12/06/20 2337 12/07/20 0338 12/07/20 1001  BP: (!) 146/81 137/80 130/79 119/66  Pulse: 63 95 (!) 44 81  Resp: 11 13 (!) 23   Temp: 98.2 F (36.8 C) 98.5 F (36.9 C) 97.7 F (36.5 C)   TempSrc: Oral Oral Oral   SpO2: 94% 90% 95%   Weight:      Height:        Intake/Output Summary (Last 24  hours) at 12/07/2020 1109 Last data filed at 12/06/2020 1802 Gross per 24 hour  Intake 440 ml  Output --  Net 440 ml   Filed Weights   12/03/20 0454 12/04/20 0352 12/05/20 0500  Weight: 48.2 kg 47.9 kg 48.5 kg    Examination:  General exam: Appears calm and comfortable.  Frail and debilitated. Looks comfortable.  Respiratory system: Clear to auscultation. Respiratory effort normal.  No added sounds. Cardiovascular system: S1 & S2 heard, RRR.  No edema. Gastrointestinal system: Abdomen is nondistended, soft and nontender. No organomegaly or masses felt. Normal bowel sounds heard. Central nervous system:  Alert and oriented. No focal neurological deficits.  Generalized weakness. Extremities: Symmetric 5 x 5 power.  Generalized weakness.     Data Reviewed: I have personally reviewed following labs and imaging studies  CBC: Recent Labs  Lab 12/02/20 0332 12/03/20 0308 12/04/20 0417 12/05/20 0201 12/06/20 0339  WBC 10.9* 13.4* 14.6* 15.2* 16.8*  NEUTROABS 9.2*  --   --   --  13.3*  HGB 10.7* 12.5 11.1* 8.6* 8.6*  HCT 32.7* 38.4 33.1* 26.8* 25.7*  MCV 92.4 92.5 91.7 93.7 92.1  PLT 218 276 341 322 096   Basic Metabolic Panel: Recent Labs  Lab 12/01/20 0025 12/02/20 0332 12/04/20 0417 12/05/20 0201 12/06/20 0339  NA 142 140 135 134* 132*  K 3.1* 3.9 4.5 4.4 4.3  CL 106 103 95* 95* 95*  CO2 27 27 29 31 27   GLUCOSE 257* 145* 130* 149* 120*  BUN 14 10 25* 25* 16  CREATININE 0.73 0.50 0.53 0.53 0.49  CALCIUM 8.6* 8.1* 9.2 8.8* 8.5*  MG  --  1.7  --   --  1.7  PHOS 1.8* 2.7  --   --  3.7   GFR: Estimated Creatinine Clearance: 43.7 mL/min (by C-G formula based on SCr of 0.49 mg/dL). Liver Function Tests: Recent Labs  Lab 12/01/20 0025 12/06/20 0339  AST  --  69*  ALT  --  103*  ALKPHOS  --  107  BILITOT  --  1.0  PROT  --  5.6*  ALBUMIN 2.6* 2.0*   No results for input(s): LIPASE, AMYLASE in the last 168 hours. No results for input(s): AMMONIA in the last 168 hours. Coagulation Profile: No results for input(s): INR, PROTIME in the last 168 hours. Cardiac Enzymes: No results for input(s): CKTOTAL, CKMB, CKMBINDEX, TROPONINI in the last 168 hours. BNP (last 3 results) No results for input(s): PROBNP in the last 8760 hours. HbA1C: No results for input(s): HGBA1C in the last 72 hours. CBG: Recent Labs  Lab 12/06/20 1548 12/06/20 1907 12/07/20 0036 12/07/20 0406 12/07/20 0756  GLUCAP 119* 108* 101* 102* 93   Lipid Profile: No results for input(s): CHOL, HDL, LDLCALC, TRIG, CHOLHDL, LDLDIRECT in the last 72 hours. Thyroid Function Tests: Recent Labs     12/05/20 0201  TSH 6.413*  FREET4 0.96   Anemia Panel: No results for input(s): VITAMINB12, FOLATE, FERRITIN, TIBC, IRON, RETICCTPCT in the last 72 hours. Sepsis Labs: No results for input(s): PROCALCITON, LATICACIDVEN in the last 168 hours.  Recent Results (from the past 240 hour(s))  Culture, respiratory (tracheal aspirate)     Status: None   Collection Time: 11/27/20  5:48 PM   Specimen: Tracheal Aspirate; Respiratory  Result Value Ref Range Status   Specimen Description TRACHEAL ASPIRATE  Final   Special Requests NONE  Final   Gram Stain   Final    NO WBC SEEN FEW YEAST  Performed at [facility], [place] [address], [place], [place] [address]    Culture MODERATE CANDIDA ALBICANS  Final   Report Status 11/29/2020 FINAL  Final  Culture, blood (routine x 2)     Status: None   Collection Time: 11/30/20  9:18 PM   Specimen: BLOOD  Result Value Ref Range Status   Specimen Description BLOOD RIGHT ANTECUBITAL  Final   Special Requests   Final    BOTTLES DRAWN AEROBIC AND ANAEROBIC Blood Culture adequate volume   Culture   Final    NO GROWTH 5 DAYS Performed at [facility], [place] [address], [place], [place] [address]    Report Status 12/05/2020 FINAL  Final  Culture, blood (routine x 2)     Status: None   Collection Time: 12/01/20 12:25 AM   Specimen: BLOOD  Result Value Ref Range Status   Specimen Description BLOOD RIGHT ANTECUBITAL  Final   Special Requests   Final    BOTTLES DRAWN AEROBIC ONLY Blood Culture adequate volume   Culture   Final    NO GROWTH 5 DAYS Performed at [facility], [place] [address] [place], [place] [address]    Report Status 12/06/2020 FINAL  Final         Radiology Studies: No results found.      Scheduled Meds: . apixaban  5 mg Oral BID  . atorvastatin  10 mg Oral Daily  . chlorhexidine  15 mL Mouth Rinse BID  . Chlorhexidine Gluconate Cloth  6 each Topical Daily  . levETIRAcetam  500 mg Oral BID  . mouth rinse  15  mL Mouth Rinse q12n4p  . metoprolol tartrate  25 mg Oral BID  . multivitamin with minerals  1 tablet Oral Daily   Continuous Infusions: . sodium chloride Stopped (12/01/20 0634)     LOS: 12 days    Time spent: 32 minutes    [name] Merino, MD [facility] Pager [phone]

## 2020-12-07 NOTE — Progress Notes (Signed)
Nutrition Follow-up  DOCUMENTATION CODES:   Non-severe (moderate) malnutrition in context of chronic illness  INTERVENTION:   Magic cup TID with meals, each supplement provides 290 kcal and 9 grams of protein  Continue MVI with minerals daily  NUTRITION DIAGNOSIS:   Moderate Malnutrition related to chronic illness (asthma and emphysema) as evidenced by moderate muscle depletion,mild fat depletion,mild muscle depletion.  ongoing  GOAL:   Patient will meet greater than or equal to 90% of their needs  Not met  MONITOR:   Diet advancement,Labs,Weight trends,TF tolerance  REASON FOR ASSESSMENT:   Consult Enteral/tube feeding initiation and management  ASSESSMENT:   80 y.o. female who has PMH of CVA in 2018, HTN, HLD, iron-deficiency anemia, asthma, emphysema, generalized gait abnormality d/t balance problem. Presented to [facility] ED with AMS and probably seizures complicated by aspiration PNA. She had roughly 2 minutes of CPR prior to intubation.  1/05 - intubated 1/06 - OGT placed  1/08 - extubated 1/10 - Cortrak placed (gastric tip) 1/14 - diet advanced to Dysphagia 2 with honey thick liquids s/p MBS 1/15 - Cortrak removed  Per MD, pt is medically stable for d/c once bed is available. ? d/c to CIR.  Limited meal documentation since diet advancement. 3 meals documented as 20% completion. Will order oral nutrition supplements for pt, though options for honey thick liquids are limited.   Admit wt: 40 kg Current wt: 48.5 kg  No UOP documented.  Medications: mvi with minerals Labs: Na 132 (L), CBGs 93-102  Diet Order:   Diet Order            DIET DYS 2 Room service appropriate? No; Fluid consistency: Honey Thick  Diet effective now                 EDUCATION NEEDS:   Not appropriate for education at this time  Skin:  Skin Assessment: Reviewed RN Assessment  Last BM:  1/16 type 7  Height:   Ht Readings from Last 1 Encounters:  11/25/20 '5\' 1"'  (1.549 m)     Weight:   Wt Readings from Last 1 Encounters:  12/05/20 48.5 kg    Ideal Body Weight:  47.7 kg  BMI:  Body mass index is 20.2 kg/m.  Estimated Nutritional Needs:   Kcal:  1450-1650  Protein:  65-75 grams  Fluid:  1.4-1.6 L    Larkin Ina, MS, RD, LDN RD pager number and weekend/on-call pager number located in [place].

## 2020-12-07 NOTE — Progress Notes (Addendum)
Inpatient Rehab Admissions Coordinator:   I spoke with Pt.'s husband over the phone and notified him that I do not have a bed for this Pt. Today. Per MD note, she is medically ready for d/c so I'm hopeful we can get her in tomorrow or later this week.  Clemens Catholic, [place], [place] Admissions Coordinator  [phone] ([place]) [phone] (office)

## 2020-12-07 NOTE — Care Management Important Message (Signed)
Important Message  Patient Details  Name: Kristine Francis MRN: [id number] Date of Birth: [birth year]   Medicare Important Message Given:  Yes - Important Message mailed due to current National Emergency  Verbal consent obtained due to current National Emergency  Relationship to patient: Self Contact Name: Angell Pincock Call Date: 12/07/20  Time: 1414 Phone: [phone] Outcome: No Answer/Busy Important Message mailed to: Patient address on file    Delorse Lek 12/07/2020, 2:14 PM

## 2020-12-08 LAB — CBC WITH DIFFERENTIAL/PLATELET
Abs Immature Granulocytes: 0.32 10*3/uL — ABNORMAL HIGH (ref 0.00–0.07)
Basophils Absolute: 0.1 10*3/uL (ref 0.0–0.1)
Basophils Relative: 0 %
Eosinophils Absolute: 0.2 10*3/uL (ref 0.0–0.5)
Eosinophils Relative: 1 %
HCT: 26.8 % — ABNORMAL LOW (ref 36.0–46.0)
Hemoglobin: 8.7 g/dL — ABNORMAL LOW (ref 12.0–15.0)
Immature Granulocytes: 2 %
Lymphocytes Relative: 7 %
Lymphs Abs: 1 10*3/uL (ref 0.7–4.0)
MCH: 30.1 pg (ref 26.0–34.0)
MCHC: 32.5 g/dL (ref 30.0–36.0)
MCV: 92.7 fL (ref 80.0–100.0)
Monocytes Absolute: 1.2 10*3/uL — ABNORMAL HIGH (ref 0.1–1.0)
Monocytes Relative: 9 %
Neutro Abs: 11.1 10*3/uL — ABNORMAL HIGH (ref 1.7–7.7)
Neutrophils Relative %: 81 %
Platelets: 622 10*3/uL — ABNORMAL HIGH (ref 150–400)
RBC: 2.89 MIL/uL — ABNORMAL LOW (ref 3.87–5.11)
RDW: 13.9 % (ref 11.5–15.5)
WBC: 13.8 10*3/uL — ABNORMAL HIGH (ref 4.0–10.5)
nRBC: 0 % (ref 0.0–0.2)

## 2020-12-08 LAB — GLUCOSE, CAPILLARY: Glucose-Capillary: 145 mg/dL — ABNORMAL HIGH (ref 70–99)

## 2020-12-08 NOTE — Progress Notes (Signed)
PROGRESS NOTE    Kristine Francis  [id number] DOB: Mar 14, 1941 DOA: 11/25/2020 PCP: [name], MD    Brief Narrative:   80 year old female with history of COPD, history of stroke, hypertension and neuropathy with balance issues brought to the ER with right-sided gaze deviation with aphasia.  She was at home, her granddaughter heard her scream and found patient bent over a table. She was brought to the ER as a code stroke.  While in the emergency room she had a witnessed seizure.  Became bradycardic and then lost her pulse. She was intubated in the ER, CPR for 2 minutes with ROSC.  Treated with Ativan and Keppra.  Neurology was consulted in the ER and recommended to continue [place]. 1/5-1/8, intubated in the ICU, delirium. 1/11, transferred to [facility] service. Passed swallow evaluation , coretrak to be removed 1/15 Waiting for Rehab   Assessment & Plan:   Principal Problem:   Grand mal seizure [facility]) Active Problems:   ANEMIA-IRON DEFICIENCY   ATAXIA   Acute encephalopathy   Cardiac arrest ([place])   Malnutrition of moderate degree   COPD (chronic obstructive pulmonary disease) ([place])  Grand mal seizure: Witnessed on admission MRI brain without any acute findings EEG with moderate to severe diffuse encephalopathy Treated with IV Keppra now transitioned to oral Keppra.  No more seizures now.  Will need neurology follow-up after discharge.  Need referral on discharge.  Acute metabolic encephalopathy: Suspected postictal state.  She is currently alert awake and oriented.  Mental status improved.  No focal deficits.  Passed swallow evaluation.  Started on dysphagia diet.    Encourage oral feeding.  Aspiration precautions. Patient does have occasional impulsiveness that is anticipated from hospital-acquired delirium.  Overall stable.  Dysphagia/aspiration pneumonia/acute hypoxemic respiratory failure: Treated with 5 days of antibiotics.  Currently on room air.  She has some leukocytosis  but no fever.  All-time aspiration precautions.  Cardiac arrest: PEA arrest secondary to seizure.  Echocardiogram was normal.  Grade 2 diastolic dysfunction.  Paroxysmal A. fib: Rate controlled.  Started on Eliquis.  On metoprolol.  TSH is normal.   Ataxia/neuropathy: Chronic problem.  B12 normal.  MRI with no acute findings.  Aggressive rehab and outpatient follow-up.  DVT prophylaxis: SCDs Start: 11/25/20 1747 apixaban (ELIQUIS) tablet 5 mg   Code Status: Full code Family Communication: none today.  Disposition Plan: Status is: Inpatient  Remains inpatient appropriate because:Inpatient level of care appropriate due to severity of illness   Dispo: The patient is from: Home              Anticipated d/c is to: CIR              Anticipated d/c date is: when bed is available.               Patient currently is medically stable when bed is available.   Consultants:   Neurology  PCCM  Procedures:   None  Antimicrobials:   Completed 5 days of Rocephin   Subjective: Patient seen and examined.  She denies any complaints.  Today she is slightly impulsive and asking repeated questions.  Denies any nausea vomiting.  She had 1 loose bowel movement last night that was dark but no evidence of bleeding.  Hemoglobin is stable.  No family at bedside.   Objective: Vitals:   12/07/20 2335 12/08/20 0329 12/08/20 0500 12/08/20 0824  BP: 118/72 137/71    Pulse: 71 77    Resp: 15 17  Temp: 97.7 F (36.5 C) 97.9 F (36.6 C)  97.7 F (36.5 C)  TempSrc: Axillary Axillary  Oral  SpO2: 97% 100%    Weight:   47.6 kg   Height:        Intake/Output Summary (Last 24 hours) at 12/08/2020 0953 Last data filed at 12/08/2020 0800 Gross per 24 hour  Intake 360 ml  Output 400 ml  Net -40 ml   Filed Weights   12/04/20 0352 12/05/20 0500 12/08/20 0500  Weight: 47.9 kg 48.5 kg 47.6 kg    Examination:  General exam: Appears calm and comfortable.  Frail and debilitated. Looks  comfortable.  Respiratory system: Clear to auscultation. Respiratory effort normal.  No added sounds. Cardiovascular system: S1 & S2 heard, RRR.  No edema. Gastrointestinal system: Abdomen is nondistended, soft and nontender. No organomegaly or masses felt. Normal bowel sounds heard. Central nervous system: Alert and oriented. No focal neurological deficits.  Generalized weakness.  slightly anxious. Extremities: Symmetric 5 x 5 power.  Generalized weakness.  Ovarian     Data Reviewed: I have personally reviewed following labs and imaging studies  CBC: Recent Labs  Lab 12/02/20 0332 12/03/20 0308 12/04/20 0417 12/05/20 0201 12/06/20 0339 12/08/20 0815  WBC 10.9* 13.4* 14.6* 15.2* 16.8* 13.8*  NEUTROABS 9.2*  --   --   --  13.3* 11.1*  HGB 10.7* 12.5 11.1* 8.6* 8.6* 8.7*  HCT 32.7* 38.4 33.1* 26.8* 25.7* 26.8*  MCV 92.4 92.5 91.7 93.7 92.1 92.7  PLT 218 276 341 322 390 017*   Basic Metabolic Panel: Recent Labs  Lab 12/02/20 0332 12/04/20 0417 12/05/20 0201 12/06/20 0339  NA 140 135 134* 132*  K 3.9 4.5 4.4 4.3  CL 103 95* 95* 95*  CO2 27 29 31 27   GLUCOSE 145* 130* 149* 120*  BUN 10 25* 25* 16  CREATININE 0.50 0.53 0.53 0.49  CALCIUM 8.1* 9.2 8.8* 8.5*  MG 1.7  --   --  1.7  PHOS 2.7  --   --  3.7   GFR: Estimated Creatinine Clearance: 43.6 mL/min (by C-G formula based on SCr of 0.49 mg/dL). Liver Function Tests: Recent Labs  Lab 12/06/20 0339  AST 69*  ALT 103*  ALKPHOS 107  BILITOT 1.0  PROT 5.6*  ALBUMIN 2.0*   No results for input(s): LIPASE, AMYLASE in the last 168 hours. No results for input(s): AMMONIA in the last 168 hours. Coagulation Profile: No results for input(s): INR, PROTIME in the last 168 hours. Cardiac Enzymes: No results for input(s): CKTOTAL, CKMB, CKMBINDEX, TROPONINI in the last 168 hours. BNP (last 3 results) No results for input(s): PROBNP in the last 8760 hours. HbA1C: No results for input(s): HGBA1C in the last 72  hours. CBG: Recent Labs  Lab 12/06/20 1548 12/06/20 1907 12/07/20 0036 12/07/20 0406 12/07/20 0756  GLUCAP 119* 108* 101* 102* 93   Lipid Profile: No results for input(s): CHOL, HDL, LDLCALC, TRIG, CHOLHDL, LDLDIRECT in the last 72 hours. Thyroid Function Tests: No results for input(s): TSH, T4TOTAL, FREET4, T3FREE, THYROIDAB in the last 72 hours. Anemia Panel: No results for input(s): VITAMINB12, FOLATE, FERRITIN, TIBC, IRON, RETICCTPCT in the last 72 hours. Sepsis Labs: No results for input(s): PROCALCITON, LATICACIDVEN in the last 168 hours.  Recent Results (from the past 240 hour(s))  Culture, blood (routine x 2)     Status: None   Collection Time: 11/30/20  9:18 PM   Specimen: BLOOD  Result Value Ref Range Status   Specimen Description  BLOOD RIGHT ANTECUBITAL  Final   Special Requests   Final    BOTTLES DRAWN AEROBIC AND ANAEROBIC Blood Culture adequate volume   Culture   Final    NO GROWTH 5 DAYS Performed at [facility], [address] [place], [place] [address]    Report Status 12/05/2020 FINAL  Final  Culture, blood (routine x 2)     Status: None   Collection Time: 12/01/20 12:25 AM   Specimen: BLOOD  Result Value Ref Range Status   Specimen Description BLOOD RIGHT ANTECUBITAL  Final   Special Requests   Final    BOTTLES DRAWN AEROBIC ONLY Blood Culture adequate volume   Culture   Final    NO GROWTH 5 DAYS Performed at [facility], [place] [address], [place], [place] [address]    Report Status 12/06/2020 FINAL  Final         Radiology Studies: No results found.      Scheduled Meds: . apixaban  5 mg Oral BID  . atorvastatin  10 mg Oral Daily  . chlorhexidine  15 mL Mouth Rinse BID  . Chlorhexidine Gluconate Cloth  6 each Topical Daily  . levETIRAcetam  500 mg Oral BID  . mouth rinse  15 mL Mouth Rinse q12n4p  . metoprolol tartrate  25 mg Oral BID  . multivitamin with minerals  1 tablet Oral Daily   Continuous Infusions: . sodium  chloride Stopped (12/01/20 0634)     LOS: 13 days    Time spent: 25 minutes    [name] Merino, MD [facility] Pager [phone]

## 2020-12-08 NOTE — Progress Notes (Signed)
  Speech Language Pathology Treatment: Dysphagia  Patient Details Name: Kristine Francis MRN: [id number] DOB: 1940/11/29 Today's Date: 12/08/2020 Time: 9678-9381 SLP Time Calculation (min) (ACUTE ONLY): 22 min  Assessment / Plan / Recommendation Clinical Impression  Pt participated in swallow exercise program with SLP with excellent effort and good accuracy (see below for additional information). Although some maneuvers (i.e. Masako) were difficult for pt to complete, she was able to complete at least one repetition of all exercises trialed. Pt was provided with written instructions for exercises and encouraged to continue swallowing exercises 3x daily as able with swallowing therapy to continue in CIR when bed is available.  Based on pt's effort and participation today, suspect she will do very well at rehab.  Pt declined additional snacks/POs with SLP.   CTAR Reps: 10 Effort: Excellent Accuracy: Excellent Verbal and written instructions for Shaker head lift provided   Masako Reps: 3 attempt, 1 successful Effort: Excellent Accuracy: Good  Effortful swallow Reps: 10 Effort/Accuracy: presumed good, unable to judge  Mendelssohn Reps: 5 Effort: Excellent Accuracy: Good  Glottal closure Reps: 10 Effort: Excellent Accuracy: Excellent   HPI HPI: 80 yo female admitted 11/25/20 after becoming unresponsive. [name], unable to use RUE. Intubated in ED. 2 minutes of CPR. MRI Multiple foci of susceptibility artifact in the bilateral cerebral hemispheres more pronounced in occipital region. Remote lacunar infarct in the right basal ganglia region, PMH: cervical fusion 2010, CVA (2018), HTN, HLD, gait abnormality, anemia, emphysema, asthma, GERD, diverticulosis, renal cyst, osteopenia. ETT 1/5-8/22      SLP Plan  Continue with current plan of care       Recommendations  Diet recommendations: Dysphagia 2 (fine chop);Honey-thick liquid Liquids provided via: Cup Medication  Administration: Crushed with puree Supervision: Patient able to self feed;Full supervision/cueing for compensatory strategies Compensations: Slow rate;Small sips/bites;Multiple dry swallows after each bite/sip;Clear throat intermittently Postural Changes and/or Swallow Maneuvers: Seated upright 90 degrees                Oral Care Recommendations: Oral care BID Follow up Recommendations: Inpatient Rehab SLP Visit Diagnosis: Dysphagia, pharyngeal phase (R13.13) Plan: Continue with current plan of care       [place], [place], [place] Office: [phone] 12/08/2020, 10:21 AM

## 2020-12-08 NOTE — Progress Notes (Signed)
Inpatient Rehab Admissions Coordinator:   I do not have a bed for this Pt. On CIR today. Will continue to follow for potential admit later this week pending bed availability.   Clemens Catholic, [place], [place] Admissions Coordinator  [phone] ([place]) [phone] (office)

## 2020-12-09 LAB — GLUCOSE, CAPILLARY
Glucose-Capillary: 123 mg/dL — ABNORMAL HIGH (ref 70–99)
Glucose-Capillary: 107 mg/dL — ABNORMAL HIGH (ref 70–99)
Glucose-Capillary: 102 mg/dL — ABNORMAL HIGH (ref 70–99)
Glucose-Capillary: 94 mg/dL (ref 70–99)
Glucose-Capillary: 95 mg/dL (ref 70–99)
Glucose-Capillary: 125 mg/dL — ABNORMAL HIGH (ref 70–99)

## 2020-12-09 NOTE — Progress Notes (Signed)
OT Cancellation Note  Patient Details Name: Kristine Francis MRN: [id number] DOB: 1941/02/13   Cancelled Treatment:    Reason Eval/Treat Not Completed: Patient at procedure or test/ unavailable;Other (comment) Pt eating lunch upon arrival, will check back as time allows.  Corinne Ports K., COTA/L Acute Rehabilitation Services [phone]   Kristine Francis 12/09/2020, 12:28 PM

## 2020-12-09 NOTE — TOC Progression Note (Signed)
Transition of Care (TOC) - Progression Note    Patient Details  Name: Shawnise H Neumeier MRN: [id number] Date of Birth: 09/27/1941  Transition of Care (TOC) CM/SW Contact   F , RN Phone Number: 12/09/2020, 4:10 PM  Clinical Narrative:    CM met with the patient and messaged son to see about [facility] if [facility] cant take her. They reluctantly agreed to having her faxed to [facility]. Pt prefers to stay at [facility] if possible. Information faxed. TOC following.   Expected Discharge Plan: Skilled Nursing Facility Barriers to Discharge: Continued Medical Work up,SNF Pending bed offer  Expected Discharge Plan and Services Expected Discharge Plan: Skilled Nursing Facility   Discharge Planning Services: CM Consult Post Acute Care Choice: Skilled Nursing Facility Living arrangements for the past 2 months: Single Family Home                                       Social Determinants of Health (SDOH) Interventions    Readmission Risk Interventions No flowsheet data found.  

## 2020-12-09 NOTE — Progress Notes (Signed)
Occupational Therapy Treatment Patient Details Name: Kristine Francis MRN: [id number] DOB: 1941/07/14 Today's Date: 12/09/2020    History of present illness 80 y.o. female presenting from home initially as code stroke with LOC and R-sided gaze and aphasia. Grand mal seizure with loss of pulse in E.D. intubated on 1/5 then extubated on 1/8. Patient also found to have acute encephalopathy, dysphagia/aspiration pneumonia and acute hypoxic respiratory failure.   OT comments  Pt making steady progress towards OT goals this session. Pt continues to present with impaired balance, generalized weakness and impaired cognition impacting pts ability to complete BADLs independently. Session focus on BADL reeducation, and functional mobility with pt needing MIN A for household distance functional mobility with RW,  MIN A for balance for standing ADLs at sink and MAX A for LB ADLs. Pt with no reports of dizziness during session however pt presents with cognitive impairments most notably in the area of memory as pt with no carryover of education throughout session and unable to recall conversation with CM during session or where her husband went. Pt would continue to benefit from skilled occupational therapy while admitted and after d/c to address the below listed limitations in order to improve overall functional mobility and facilitate independence with BADL participation. DC plan remains appropriate, will follow acutely per POC.      Follow Up Recommendations  SNF;CIR    Equipment Recommendations  3 in 1 bedside commode;Tub/shower seat    Recommendations for Other Services      Precautions / Restrictions Precautions Precautions: Fall;Other (comment) Precaution Comments: seizure Restrictions Weight Bearing Restrictions: No       Mobility Bed Mobility   Bed Mobility: Supine to Sit     Supine to sit: Min assist;HOB elevated     General bed mobility comments: MIN A to scoot hips to EOB with use  of bed pad  Transfers Overall transfer level: Needs assistance Equipment used: Rolling walker (2 wheeled) Transfers: Sit to/from [facility] Sit to Stand: Min assist Stand pivot transfers: Min assist       General transfer comment: pt completed x3 sit<>stands from EOB, chair with arm rests and BSC needing MINA to power into standing and for initial standing support. MIN for stand pivot transfer from EOB<>BSC needing cues for RW mgmt and overall body mechanics    Balance Overall balance assessment: Needs assistance Sitting-balance support: Feet supported;Bilateral upper extremity supported Sitting balance-Leahy Scale: Poor Sitting balance - Comments: reliant on UE support   Standing balance support: Single extremity supported;During functional activity Standing balance-Leahy Scale: Poor Standing balance comment: at least one UE supported duirng standing ADLs                           ADL either performed or assessed with clinical judgement   ADL Overall ADL's : Needs assistance/impaired     Grooming: Wash/dry hands;Standing;Minimal assistance Grooming Details (indicate cue type and reason): MIN A for balance while standing at sink to wash hands     Lower Body Bathing: Supervison/ safety;Set up;Min guard;Sitting/lateral leans Lower Body Bathing Details (indicate cue type and reason): simulated via anterior pericare from [facility]     Lower Body Dressing: Maximal assistance;Sit to/from stand Lower Body Dressing Details (indicate cue type and reason): to don <>doff panties during toileting Toilet Transfer: Minimal assistance;RW;Stand-pivot Toilet Transfer Details (indicate cue type and reason): MIN A for stand pivot transfer from EOB<>BSC with Rw Toileting- Clothing Manipulation and Hygiene: Supervision/safety;Set  up;Min guard;Sitting/lateral lean Toileting - Clothing Manipulation Details (indicate cue type and reason): anterior pericare via lateral leans on  BSC     Functional mobility during ADLs: Minimal assistance;Cueing for safety;Rolling walker General ADL Comments: pt continues to present with impaired balance, generalized weakness and cognitive deficits     Vision       Perception     Praxis      Cognition Arousal/Alertness: Awake/alert Behavior During Therapy: WFL for tasks assessed/performed Overall Cognitive Status: Impaired/Different from baseline Area of Impairment: Attention;Memory;Following commands;Safety/judgement;Awareness;Problem solving                   Current Attention Level: Selective Memory: Decreased recall of precautions;Decreased short-term memory (repeats same questions, forgot her husband went to [place]) Following Commands: Follows one step commands consistently Safety/Judgement: Decreased awareness of safety;Decreased awareness of deficits Awareness: Emergent Problem Solving: Slow processing;Requires verbal cues General Comments: poor carryover of education provided and impaired STM deficits        Exercises     Shoulder Instructions       General Comments no reports of dizziness during session    Pertinent Vitals/ Pain       Pain Assessment: No/denies pain  Home Living                                          Prior Functioning/Environment              Frequency  Min 2X/week        Progress Toward Goals  OT Goals(current goals can now be found in the care plan section)  Progress towards OT goals: Progressing toward goals  Acute Rehab OT Goals Patient Stated Goal: to return to independence OT Goal Formulation: With patient Time For Goal Achievement: 12/17/20 Potential to Achieve Goals: Good  Plan Discharge plan remains appropriate;Frequency remains appropriate    Co-evaluation                 AM-PAC OT "6 Clicks" Daily Activity     Outcome Measure   Help from another person eating meals?: A Lot Help from another person taking care of  personal grooming?: A Little Help from another person toileting, which includes using toliet, bedpan, or urinal?: A Little Help from another person bathing (including washing, rinsing, drying)?: A Lot Help from another person to put on and taking off regular upper body clothing?: A Lot Help from another person to put on and taking off regular lower body clothing?: A Lot 6 Click Score: 14    End of Session Equipment Utilized During Treatment: Gait belt;Rolling walker;Other (comment) (BSC)  OT Visit Diagnosis: Unsteadiness on feet (R26.81);Muscle weakness (generalized) (M62.81);History of falling (Z91.81)   Activity Tolerance Patient tolerated treatment well   Patient Left in chair;with call bell/phone within reach;with chair alarm set   Nurse Communication Mobility status;Other (comment) (needs new purewick)        Time: 6962-9528 OT Time Calculation (min): 26 min  Charges: OT General Charges $OT Visit: 1 Visit OT Treatments $Self Care/Home Management : 23-37 mins  Harley Alto., COTA/L Acute Rehabilitation Services [phone]   Precious Haws 12/09/2020, 4:58 PM

## 2020-12-09 NOTE — Progress Notes (Signed)
Inpatient Rehabilitation Admissions Coordinator  I met with patient and her spouse at bedside. I informed them that Cir bed is not available today. I also informed TOC of same.  Danne Baxter, RN, MSN Rehab Admissions Coordinator [phone] 12/09/2020 1:51 PM

## 2020-12-09 NOTE — Progress Notes (Signed)
Physical Therapy Treatment Patient Details Name: Kristine Francis MRN: [id number] DOB: 1940-12-28 Today's Date: 12/09/2020    History of Present Illness 80 y.o. female presenting from home initially as code stroke with LOC and R-sided gaze and aphasia. Grand mal seizure with loss of pulse in E.D. intubated on 1/5 then extubated on 1/8. Patient also found to have acute encephalopathy, dysphagia/aspiration pneumonia and acute hypoxic respiratory failure.    PT Comments    Pt motivated to participate and improve, but displays cognitive deficits including STM, sequencing, problem-solving, and attention deficits as she required repeated multi-modal cues to sequence all tasks and she was unable to follow more than 1 cue at a time. Focused session on increasing gait distance, per pt request, with pt ambulating x3 bouts of ~20 ft with RW and minA. Provided verbal and tactile cues to "march" and improve posture to increase feet clearance, step length, and superior gaze, with momentary success following 1 cue at a time. Good carryover sequencing hand transitions when coming to stand and with managing the RW with turns though. Will continue to follow acutely. Current recommendations remain appropriate.  Follow Up Recommendations  SNF     Equipment Recommendations  Rolling walker with 5" wheels;Wheelchair (measurements PT)    Recommendations for Other Services       Precautions / Restrictions Precautions Precautions: Fall;Other (comment) Precaution Comments: seizure Restrictions Weight Bearing Restrictions: No    Mobility  Bed Mobility Overal bed mobility: Needs Assistance Bed Mobility: Supine to Sit     Supine to sit: HOB elevated;Min guard     General bed mobility comments: Extra time and cues to manage legs off EOB and pull on bed rail to ascend trunk, min guard assist with HOB elevated.  Transfers Overall transfer level: Needs assistance Equipment used: Rolling walker (2  wheeled) Transfers: Sit to/from Stand Sit to Stand: Min assist Stand pivot transfers: Min assist       General transfer comment: Assist to power to standing with cues for hand placement as pt pulling up on RW initially, good carryover with subsequent reps. Stood from [employer], from chair x2.  Ambulation/Gait Ambulation/Gait assistance: Min assist Gait Distance (Feet): 20 Feet (x3 bouts with seated rest break between each bout) Assistive device: Rolling walker (2 wheeled) Gait Pattern/deviations: Trunk flexed;Shuffle;Ataxic;Step-through pattern;Decreased stride length Gait velocity: reduced Gait velocity interpretation: <1.31 ft/sec, indicative of household ambulator General Gait Details: Slow, ataxic like gait with short shuffling steps, cuing pt to take "marching" steps with tactile cues at hamstrings, success but poor carryover. Cued pt also to improve upright posture as she tends to flex at her trunk, momentary success. Unable to perform both cues simultaneously. Good carryover with managing RW then taking steps sequencing turns.   Stairs             Wheelchair Mobility    Modified Rankin (Stroke Patients Only)       Balance Overall balance assessment: Needs assistance Sitting-balance support: Feet supported;Bilateral upper extremity supported Sitting balance-Leahy Scale: Poor Sitting balance - Comments: reliant on UE support   Standing balance support: During functional activity;Bilateral upper extremity supported Standing balance-Leahy Scale: Poor Standing balance comment: BUE support on RW and external support                            Cognition Arousal/Alertness: Awake/alert Behavior During Therapy: WFL for tasks assessed/performed Overall Cognitive Status: Impaired/Different from baseline Area of Impairment: Attention;Memory;Following commands;Safety/judgement;Awareness;Problem solving  Current Attention Level:  Selective Memory: Decreased recall of precautions;Decreased short-term memory Following Commands: Follows one step commands consistently;Follows multi-step commands inconsistently Safety/Judgement: Decreased awareness of safety;Decreased awareness of deficits Awareness: Emergent Problem Solving: Slow processing;Requires verbal cues General Comments: Repeated cues provided throughout session. Difficulty following 2 cues at same time.      Exercises      General Comments General comments (skin integrity, edema, etc.): No reports of dizziness      Pertinent Vitals/Pain Pain Assessment: No/denies pain Faces Pain Scale: No hurt Pain Intervention(s): Monitored during session    Home Living                      Prior Function            PT Goals (current goals can now be found in the care plan section) Acute Rehab PT Goals Patient Stated Goal: to walk further PT Goal Formulation: With patient/family Time For Goal Achievement: 12/16/20 Potential to Achieve Goals: Good Progress towards PT goals: Progressing toward goals    Frequency    Min 3X/week      PT Plan Current plan remains appropriate    Co-evaluation              AM-PAC PT "6 Clicks" Mobility   Outcome Measure  Help needed turning from your back to your side while in a flat bed without using bedrails?: A Little Help needed moving from lying on your back to sitting on the side of a flat bed without using bedrails?: A Little Help needed moving to and from a bed to a chair (including a wheelchair)?: A Little Help needed standing up from a chair using your arms (e.g., wheelchair or bedside chair)?: A Little Help needed to walk in hospital room?: A Little Help needed climbing 3-5 steps with a railing? : A Lot 6 Click Score: 17    End of Session Equipment Utilized During Treatment: Gait belt Activity Tolerance: Patient tolerated treatment well;Patient limited by fatigue Patient left: in chair;with  call bell/phone within reach;with chair alarm set;with family/visitor present   PT Visit Diagnosis: Other abnormalities of gait and mobility (R26.89);Muscle weakness (generalized) (M62.81);Other symptoms and signs involving the nervous system (R29.898);Unsteadiness on feet (R26.81);Difficulty in walking, not elsewhere classified (R26.2);Ataxic gait (R26.0)     Time: 7902-4097 PT Time Calculation (min) (ACUTE ONLY): 33 min  Charges:  $Gait Training: 23-37 mins                     Moishe Spice, PT, DPT Acute Rehabilitation Services  Pager: [phone] Office: [place] 12/09/2020, 5:53 PM

## 2020-12-09 NOTE — Progress Notes (Signed)
PROGRESS NOTE    Kristine Francis  SWN:[id number] DOB: January 20, 1941 DOA: 11/25/2020 PCP: [name], MD    Brief Narrative:   80 year old female with history of COPD, history of stroke, hypertension and neuropathy with balance issues brought to the ER with right-sided gaze deviation with aphasia.  She was at home, her granddaughter heard her scream and found patient bent over a table. She was brought to the ER as a code stroke.  While in the emergency room she had a witnessed seizure.  Became bradycardic and then lost her pulse. She was intubated in the ER, CPR for 2 minutes with ROSC.  Treated with Ativan and Keppra.  Neurology was consulted in the ER and recommended to continue Me[place]. 1/5-1/8, intubated in the ICU, delirium. 1/11, transferred to [facility] service. Passed swallow evaluation , coretrak to be removed 1/15 Waiting for acute inpatient rehab.   Assessment & Plan:   Principal Problem:   Grand mal seizure ([place]) Active Problems:   ANEMIA-IRON DEFICIENCY   ATAXIA   Acute encephalopathy   Cardiac arrest (HCC)   Malnutrition of moderate degree   COPD (chronic obstructive pulmonary disease) ([place])  Grand mal seizure: Witnessed on admission MRI brain without any acute findings EEG with moderate to severe diffuse encephalopathy Treated with IV Keppra now transitioned to oral Keppra.  No more seizures now.  Will need neurology follow-up after discharge.  Need referral on discharge.  Acute metabolic encephalopathy: Suspected postictal state.  She is currently alert awake and oriented.  Mental status improved.  No focal deficits.  Passed swallow evaluation.  Started on dysphagia diet.    Encourage oral feeding.  Aspiration precautions. Patient does have occasional impulsiveness that is anticipated from hospital-acquired delirium.  Overall stable.  Dysphagia/aspiration pneumonia/acute hypoxemic respiratory failure: Treated with 5 days of antibiotics.  Currently on room air.  She has  some leukocytosis but no fever.  All-time aspiration precautions.  Cardiac arrest: PEA arrest secondary to seizure.  Echocardiogram was normal.  Grade 2 diastolic dysfunction.  Paroxysmal A. fib: Rate controlled.  Started on Eliquis.  On metoprolol.  TSH is normal.   Ataxia/neuropathy: Chronic problem.  B12 normal.  MRI with no acute findings.  Aggressive rehab and outpatient follow-up.  DVT prophylaxis: SCDs Start: 11/25/20 1747 apixaban (ELIQUIS) tablet 5 mg   Code Status: Full code Family Communication: Patient's husband at bedside. Disposition Plan: Status is: Inpatient  Remains inpatient appropriate because:Inpatient level of care appropriate due to severity of illness   Dispo: The patient is from: Home              Anticipated d/c is to: CIR              Anticipated d/c date is: when bed is available.               Patient currently is medically stable when bed is available.   Consultants:   Neurology  PCCM  Procedures:   None  Antimicrobials:   Completed 5 days of Rocephin   Subjective: Patient seen and examined.  Husband at the bedside.  Denies any complaints.  Less impulsive today.   Objective: Vitals:   12/08/20 2354 12/09/20 0339 12/09/20 0826 12/09/20 1126  BP: (!) 156/91 137/64  122/63  Pulse: 60 64 61 68  Resp: 17 17    Temp: 97.7 F (36.5 C) 97.7 F (36.5 C) 97.6 F (36.4 C)   TempSrc: Oral Oral Oral   SpO2: 96% 95% 96%  Weight:      Height:        Intake/Output Summary (Last 24 hours) at 12/09/2020 1144 Last data filed at 12/09/2020 0800 Gross per 24 hour  Intake 360 ml  Output 200 ml  Net 160 ml   Filed Weights   12/04/20 0352 12/05/20 0500 12/08/20 0500  Weight: 47.9 kg 48.5 kg 47.6 kg    Examination:  General exam: Appears calm and comfortable.  Frail and debilitated. Looks comfortable.  On room air. Respiratory system: Clear to auscultation. Respiratory effort normal.  No added sounds. Cardiovascular system: S1 & S2 heard,  RRR.  No edema. Gastrointestinal system: Abdomen is nondistended, soft and nontender. No organomegaly or masses felt. Normal bowel sounds heard. Central nervous system: Alert and oriented. No focal neurological deficits.  Generalized weakness.  slightly anxious. Extremities: Symmetric 5 x 5 power.  Overall generalized weakness.     Data Reviewed: I have personally reviewed following labs and imaging studies  CBC: Recent Labs  Lab 12/03/20 0308 12/04/20 0417 12/05/20 0201 12/06/20 0339 12/08/20 0815  WBC 13.4* 14.6* 15.2* 16.8* 13.8*  NEUTROABS  --   --   --  13.3* 11.1*  HGB 12.5 11.1* 8.6* 8.6* 8.7*  HCT 38.4 33.1* 26.8* 25.7* 26.8*  MCV 92.5 91.7 93.7 92.1 92.7  PLT 276 341 322 390 [id number]*   Basic Metabolic Panel: Recent Labs  Lab 12/04/20 0417 12/05/20 0201 12/06/20 0339  NA 135 134* 132*  K 4.5 4.4 4.3  CL 95* 95* 95*  CO2 29 31 27   GLUCOSE 130* 149* 120*  BUN 25* 25* 16  CREATININE 0.53 0.53 0.49  CALCIUM 9.2 8.8* 8.5*  MG  --   --  1.7  PHOS  --   --  3.7   GFR: Estimated Creatinine Clearance: 43.6 mL/min (by C-G formula based on SCr of 0.49 mg/dL). Liver Function Tests: Recent Labs  Lab 12/06/20 0339  AST 69*  ALT 103*  ALKPHOS 107  BILITOT 1.0  PROT 5.6*  ALBUMIN 2.0*   No results for input(s): LIPASE, AMYLASE in the last 168 hours. No results for input(s): AMMONIA in the last 168 hours. Coagulation Profile: No results for input(s): INR, PROTIME in the last 168 hours. Cardiac Enzymes: No results for input(s): CKTOTAL, CKMB, CKMBINDEX, TROPONINI in the last 168 hours. BNP (last 3 results) No results for input(s): PROBNP in the last 8760 hours. HbA1C: No results for input(s): HGBA1C in the last 72 hours. CBG: Recent Labs  Lab 12/07/20 0756 12/08/20 2216 12/09/20 0120 12/09/20 0337 12/09/20 0746  GLUCAP 93 145* 123* 107* 102*   Lipid Profile: No results for input(s): CHOL, HDL, LDLCALC, TRIG, CHOLHDL, LDLDIRECT in the last 72  hours. Thyroid Function Tests: No results for input(s): TSH, T4TOTAL, FREET4, T3FREE, THYROIDAB in the last 72 hours. Anemia Panel: No results for input(s): VITAMINB12, FOLATE, FERRITIN, TIBC, IRON, RETICCTPCT in the last 72 hours. Sepsis Labs: No results for input(s): PROCALCITON, LATICACIDVEN in the last 168 hours.  Recent Results (from the past 240 hour(s))  Culture, blood (routine x 2)     Status: None   Collection Time: 11/30/20  9:18 PM   Specimen: BLOOD  Result Value Ref Range Status   Specimen Description BLOOD RIGHT ANTECUBITAL  Final   Special Requests   Final    BOTTLES DRAWN AEROBIC AND ANAEROBIC Blood Culture adequate volume   Culture   Final    NO GROWTH 5 DAYS Performed at [facility], [address], [place], [place] [address]    Report Status 12/05/2020 FINAL  Final  Culture, blood (routine x 2)     Status: None   Collection Time: 12/01/20 12:25 AM   Specimen: BLOOD  Result Value Ref Range Status   Specimen Description BLOOD RIGHT ANTECUBITAL  Final   Special Requests   Final    BOTTLES DRAWN AEROBIC ONLY Blood Culture adequate volume   Culture   Final    NO GROWTH 5 DAYS Performed at [facility], [place] [address], [place], [place] [address]    Report Status 12/06/2020 FINAL  Final         Radiology Studies: No results found.      Scheduled Meds: . apixaban  5 mg Oral BID  . atorvastatin  10 mg Oral Daily  . chlorhexidine  15 mL Mouth Rinse BID  . Chlorhexidine Gluconate Cloth  6 each Topical Daily  . levETIRAcetam  500 mg Oral BID  . mouth rinse  15 mL Mouth Rinse q12n4p  . metoprolol tartrate  25 mg Oral BID  . multivitamin with minerals  1 tablet Oral Daily   Continuous Infusions: . sodium chloride Stopped (12/01/20 0634)     LOS: 14 days    Time spent: 25 minutes    [name] Merino, MD [facility] Pager [phone]

## 2020-12-10 ENCOUNTER — Other Ambulatory Visit: Payer: Self-pay

## 2020-12-10 ENCOUNTER — Encounter (HOSPITAL_COMMUNITY): Payer: Self-pay | Admitting: Physical Medicine & Rehabilitation

## 2020-12-10 ENCOUNTER — Inpatient Hospital Stay (HOSPITAL_COMMUNITY)
Admission: RE | Admit: 2020-12-10 | Discharge: 2020-12-24 | DRG: 091 | Disposition: A | Discharge status: Home / Self Care | Payer: Medicare Other | Source: Intra-hospital | Attending: Physical Medicine & Rehabilitation | Admitting: Physical Medicine & Rehabilitation

## 2020-12-10 DIAGNOSIS — G931 Anoxic brain damage, not elsewhere classified: Principal | ICD-10-CM | POA: Diagnosis present

## 2020-12-10 DIAGNOSIS — L89152 Pressure ulcer of sacral region, stage 2: Secondary | ICD-10-CM | POA: Diagnosis present

## 2020-12-10 DIAGNOSIS — E854 Organ-limited amyloidosis: Secondary | ICD-10-CM | POA: Diagnosis present

## 2020-12-10 DIAGNOSIS — E871 Hypo-osmolality and hyponatremia: Secondary | ICD-10-CM

## 2020-12-10 DIAGNOSIS — E8809 Other disorders of plasma-protein metabolism, not elsewhere classified: Secondary | ICD-10-CM | POA: Diagnosis present

## 2020-12-10 DIAGNOSIS — K760 Fatty (change of) liver, not elsewhere classified: Secondary | ICD-10-CM | POA: Diagnosis present

## 2020-12-10 DIAGNOSIS — I10 Essential (primary) hypertension: Secondary | ICD-10-CM | POA: Diagnosis present

## 2020-12-10 DIAGNOSIS — R64 Cachexia: Secondary | ICD-10-CM | POA: Diagnosis present

## 2020-12-10 DIAGNOSIS — G119 Hereditary ataxia, unspecified: Secondary | ICD-10-CM

## 2020-12-10 DIAGNOSIS — K219 Gastro-esophageal reflux disease without esophagitis: Secondary | ICD-10-CM | POA: Diagnosis present

## 2020-12-10 DIAGNOSIS — I680 Cerebral amyloid angiopathy: Secondary | ICD-10-CM | POA: Diagnosis present

## 2020-12-10 DIAGNOSIS — R339 Retention of urine, unspecified: Secondary | ICD-10-CM | POA: Diagnosis not present

## 2020-12-10 DIAGNOSIS — Z23 Encounter for immunization: Secondary | ICD-10-CM | POA: Diagnosis not present

## 2020-12-10 DIAGNOSIS — Z7901 Long term (current) use of anticoagulants: Secondary | ICD-10-CM

## 2020-12-10 DIAGNOSIS — Z8673 Personal history of transient ischemic attack (TIA), and cerebral infarction without residual deficits: Secondary | ICD-10-CM

## 2020-12-10 DIAGNOSIS — D649 Anemia, unspecified: Secondary | ICD-10-CM

## 2020-12-10 DIAGNOSIS — Z888 Allergy status to other drugs, medicaments and biological substances status: Secondary | ICD-10-CM

## 2020-12-10 DIAGNOSIS — D509 Iron deficiency anemia, unspecified: Secondary | ICD-10-CM | POA: Diagnosis present

## 2020-12-10 DIAGNOSIS — E43 Unspecified severe protein-calorie malnutrition: Secondary | ICD-10-CM | POA: Diagnosis present

## 2020-12-10 DIAGNOSIS — J45909 Unspecified asthma, uncomplicated: Secondary | ICD-10-CM | POA: Diagnosis present

## 2020-12-10 DIAGNOSIS — Z91041 Radiographic dye allergy status: Secondary | ICD-10-CM

## 2020-12-10 DIAGNOSIS — I480 Paroxysmal atrial fibrillation: Secondary | ICD-10-CM | POA: Diagnosis present

## 2020-12-10 DIAGNOSIS — E785 Hyperlipidemia, unspecified: Secondary | ICD-10-CM | POA: Diagnosis present

## 2020-12-10 DIAGNOSIS — R5381 Other malaise: Secondary | ICD-10-CM | POA: Diagnosis present

## 2020-12-10 DIAGNOSIS — G4700 Insomnia, unspecified: Secondary | ICD-10-CM | POA: Diagnosis present

## 2020-12-10 DIAGNOSIS — G9340 Encephalopathy, unspecified: Secondary | ICD-10-CM | POA: Diagnosis not present

## 2020-12-10 DIAGNOSIS — R1313 Dysphagia, pharyngeal phase: Secondary | ICD-10-CM | POA: Diagnosis present

## 2020-12-10 DIAGNOSIS — Z681 Body mass index (BMI) 19 or less, adult: Secondary | ICD-10-CM | POA: Diagnosis not present

## 2020-12-10 DIAGNOSIS — Z85828 Personal history of other malignant neoplasm of skin: Secondary | ICD-10-CM

## 2020-12-10 DIAGNOSIS — L8990 Pressure ulcer of unspecified site, unspecified stage: Secondary | ICD-10-CM | POA: Insufficient documentation

## 2020-12-10 DIAGNOSIS — R195 Other fecal abnormalities: Secondary | ICD-10-CM

## 2020-12-10 DIAGNOSIS — Z79899 Other long term (current) drug therapy: Secondary | ICD-10-CM

## 2020-12-10 DIAGNOSIS — G40409 Other generalized epilepsy and epileptic syndromes, not intractable, without status epilepticus: Secondary | ICD-10-CM

## 2020-12-10 LAB — GLUCOSE, CAPILLARY
Glucose-Capillary: 100 mg/dL — ABNORMAL HIGH (ref 70–99)
Glucose-Capillary: 101 mg/dL — ABNORMAL HIGH (ref 70–99)
Glucose-Capillary: 81 mg/dL (ref 70–99)
Glucose-Capillary: 93 mg/dL (ref 70–99)

## 2020-12-10 MED ORDER — BLOOD PRESSURE CONTROL BOOK
Freq: Once | Status: AC
  Filled 2020-12-10: qty 1

## 2020-12-10 MED ORDER — METOPROLOL TARTRATE 25 MG PO TABS
25.0000 mg | ORAL_TABLET | Freq: Two times a day (BID) | ORAL | Status: DC
  Administered 2020-12-10 – 2020-12-24 (×28): 25 mg via ORAL
  Filled 2020-12-10 (×28): qty 1

## 2020-12-10 MED ORDER — RESOURCE THICKENUP CLEAR PO POWD
ORAL | Status: DC | PRN
  Filled 2020-12-10: qty 125

## 2020-12-10 MED ORDER — CHLORHEXIDINE GLUCONATE 0.12 % MT SOLN
15.0000 mL | Freq: Two times a day (BID) | OROMUCOSAL | Status: DC
  Administered 2020-12-10 – 2020-12-23 (×26): 15 mL via OROMUCOSAL
  Filled 2020-12-10 (×24): qty 15

## 2020-12-10 MED ORDER — ADULT MULTIVITAMIN W/MINERALS CH
1.0000 | ORAL_TABLET | Freq: Every day | ORAL | Status: DC
  Administered 2020-12-11 – 2020-12-24 (×14): 1 via ORAL
  Filled 2020-12-10 (×14): qty 1

## 2020-12-10 MED ORDER — ADULT MULTIVITAMIN W/MINERALS CH: 1.0000 | ORAL_TABLET | Freq: Every day | ORAL | Status: DC

## 2020-12-10 MED ORDER — DIPHENHYDRAMINE HCL 12.5 MG/5ML PO ELIX: 12.5000 mg | ORAL_SOLUTION | Freq: Four times a day (QID) | ORAL | Status: DC | PRN

## 2020-12-10 MED ORDER — BISACODYL 10 MG RE SUPP
10.0000 mg | Freq: Every day | RECTAL | Status: DC | PRN
  Administered 2020-12-14: 10 mg via RECTAL
  Filled 2020-12-10: qty 1

## 2020-12-10 MED ORDER — FLEET ENEMA 7-19 GM/118ML RE ENEM: 1.0000 | ENEMA | Freq: Once | RECTAL | Status: DC | PRN

## 2020-12-10 MED ORDER — ACETAMINOPHEN 325 MG PO TABS
325.0000 mg | ORAL_TABLET | ORAL | Status: DC | PRN
  Filled 2020-12-10: qty 2

## 2020-12-10 MED ORDER — LEVETIRACETAM 500 MG PO TABS: 500.0000 mg | ORAL_TABLET | Freq: Two times a day (BID) | ORAL | 2 refills | Status: DC

## 2020-12-10 MED ORDER — APIXABAN 5 MG PO TABS: 5.0000 mg | ORAL_TABLET | Freq: Two times a day (BID) | ORAL | 0 refills | Status: DC

## 2020-12-10 MED ORDER — LIP MEDEX EX OINT
TOPICAL_OINTMENT | CUTANEOUS | Status: DC | PRN
  Filled 2020-12-10: qty 7

## 2020-12-10 MED ORDER — LEVETIRACETAM 500 MG PO TABS
500.0000 mg | ORAL_TABLET | Freq: Two times a day (BID) | ORAL | Status: DC
  Administered 2020-12-10 – 2020-12-16 (×13): 500 mg via ORAL
  Filled 2020-12-10 (×14): qty 1

## 2020-12-10 MED ORDER — ALUM & MAG HYDROXIDE-SIMETH 200-200-20 MG/5ML PO SUSP: 30.0000 mL | ORAL | Status: DC | PRN

## 2020-12-10 MED ORDER — APIXABAN 5 MG PO TABS
5.0000 mg | ORAL_TABLET | Freq: Two times a day (BID) | ORAL | Status: DC
  Administered 2020-12-10 – 2020-12-24 (×28): 5 mg via ORAL
  Filled 2020-12-10 (×28): qty 1

## 2020-12-10 MED ORDER — PROCHLORPERAZINE 25 MG RE SUPP
12.5000 mg | Freq: Four times a day (QID) | RECTAL | Status: DC | PRN
Start: 2020-12-10 — End: 2020-12-24

## 2020-12-10 MED ORDER — PROCHLORPERAZINE EDISYLATE 10 MG/2ML IJ SOLN: 5.0000 mg | Freq: Four times a day (QID) | INTRAMUSCULAR | Status: DC | PRN

## 2020-12-10 MED ORDER — TRAZODONE HCL 50 MG PO TABS
25.0000 mg | ORAL_TABLET | Freq: Every evening | ORAL | Status: DC | PRN
  Administered 2020-12-11 – 2020-12-20 (×2): 25 mg via ORAL
  Administered 2020-12-21 – 2020-12-22 (×2): 50 mg via ORAL
  Filled 2020-12-10 (×5): qty 1

## 2020-12-10 MED ORDER — ATORVASTATIN CALCIUM 10 MG PO TABS: 10.0000 mg | ORAL_TABLET | Freq: Every day | ORAL | Status: DC

## 2020-12-10 MED ORDER — ATORVASTATIN CALCIUM 10 MG PO TABS
10.0000 mg | ORAL_TABLET | Freq: Every day | ORAL | Status: DC
  Administered 2020-12-11 – 2020-12-24 (×14): 10 mg via ORAL
  Filled 2020-12-10 (×14): qty 1

## 2020-12-10 MED ORDER — GUAIFENESIN-DM 100-10 MG/5ML PO SYRP
5.0000 mL | ORAL_SOLUTION | Freq: Four times a day (QID) | ORAL | Status: DC | PRN
  Administered 2020-12-10: 10 mL via ORAL
  Filled 2020-12-10: qty 10

## 2020-12-10 MED ORDER — SENNOSIDES-DOCUSATE SODIUM 8.6-50 MG PO TABS: 1.0000 | ORAL_TABLET | Freq: Every evening | ORAL | Status: DC | PRN

## 2020-12-10 MED ORDER — PROCHLORPERAZINE MALEATE 5 MG PO TABS: 5.0000 mg | ORAL_TABLET | Freq: Four times a day (QID) | ORAL | Status: DC | PRN

## 2020-12-10 MED ORDER — METOPROLOL TARTRATE 25 MG PO TABS: 25.0000 mg | ORAL_TABLET | Freq: Two times a day (BID) | ORAL | 0 refills | Status: DC

## 2020-12-10 NOTE — Progress Notes (Signed)
Inpatient Rehab Admissions Coordinator:  ° °I have a CIR bed for this Pt. Today. RN may call report to [phone]. ° °Joaquin Knebel, MS, CCC-SLP °Rehab Admissions Coordinator  °[phone] (celll) °[phone] (office) ° °

## 2020-12-10 NOTE — Progress Notes (Signed)
Show:Clear all '[x]' Manual'[x]' Template'[x]' Copied  Added by: '[x]' Lovorn, Megan, MD'[x]' Genella Mech, CCC-SLP   '[]' Hover for details  PMR Admission Coordinator Pre-Admission Assessment  Patient: Kristine Francis is an 80 y.o., female MRN: [id number] DOB: July 19, 1941 Height: '5\' 1"'  (154.9 cm) Weight: 47.6 kg  Insurance Information HMO:    PPO:      PCP:      IPA:      80/20:      OTHER:  PRIMARY: Medicare Part A and B      Policy#: [id number]   Subscriber: pt Benefits: Phone #: passport one sourceverified on 1/1/22Name: 12/23 Eff. Date:04/22/2007 Deduct: $8264BRA of Pocket Max: noneLife Max: none CIR:100%SNF: 20 full days Outpatient:80%Co-Pay: 20% Home Health:100%Co-Pay: none DME:80%Co-Pay: 20% Providers:pt choice SECONDARY: None     Policy#:      Phone#:  Financial Counselor:       Phone#:   The Therapist, art Information Summary" for patients in Inpatient Rehabilitation Facilities with attached "Privacy Act [place] Records" was provided and verbally reviewed with: Patient  Emergency Contact Information         Contact Information    Name Relation Home Work Mobile   [place] Spouse [phone]  [phone]   Terryl, Niziolek   [phone]      Current Medical History  Patient Admitting Diagnosis: Debility History of Present Illness:Kristine H Laytonis a 80 y.o.femalewith PMH significant for asthma, GERD, hepatic steatosis, HTN, HLD, chronic latent nystagmus who presented to ED with a seizure. Patient was at her baseline, takes care of her grand kids and lives with her husband. [place] daughter heard a loud scream and then found the patient leaning down on the table. Husband who had just gone for 20 mins, returned to find her leaning on the table and unable to move her right side. EMS was called and she was brought in as a stroke code. She was noted by EMS to have a R gaze  deviation and only following one step commands and concern for potential aphasia. As soon as she came in to the ED, she was noted to have copious amounts of secretions and before I could examine her at all, she had a GTC seizure. Patient had an ictal cry > figure of 4 sign with her R hand up > R gaze deviation and then full body rhythmic GTC. During the seizure, she went bradycardic to 40s. She was taken to resuscitation bay and intubated.  Workup with CTH and a CT Angio was obtained. CT Head was negative for a large territory infarct. She was premedicated with solumderol and Benadryl and a CT angio with contrast was obtained which wasnegative for LVO. Pt. Experienced  PEA arrest secondary to seizure. Echocardiogram was normal. Grade 2 diastolic dysfunction. Found to have  paroxysmal A. fib: Rate controlled. Started on Eliquis. On metoprolol. Pt. Also developed dysphagia/aspiration pneumonia/acute hypoxemic respiratory failure and was treated with 5 days of antibioticsnd is .Currently on  fine chopped diet with honey thick liquids.    Patient[facility] has been reviewed by the rehabilitation admission coordinator and physician.  Past Medical History      Past Medical History:  Diagnosis Date  . Allergy   . Asthma    sees Dr. Tiajuana Amass - as child per pt  . At high risk for falls    unstable gait   . Ataxia   . Cataract    removed bilaterally   .  Diverticulosis   . Emphysema   . Gait abnormality 05/14/2013   Ms.Kristine Francis a patient of Dr. Linda Hedges presented first interpreter thousand 13 with a gait dysfunction, she also had an episodic confusion and Loss device. Exam found a mild nystagmus and she was referred to ophthalmology on 03-2812, brain MRI was normal nystagmus was a secondary diagnosis to vestibulitis, ear nose and throat has followed and had seen the patient. At the time of the nystagmus the patie  . GERD (gastroesophageal reflux  disease)   . Hepatic steatosis   . Hyperlipidemia    on atorvastatin   . Hypertension    on losartan   . Iron deficiency anemia, unspecified   . Kidney cysts 11/16/12   small cyst on left  . Nystagmus, end-position   . Osteopenia   . Renal cyst   . Stroke ([place]) 01/2017   mild     Family History   family history includes Breast cancer in her paternal aunt; Cancer - Lung in her paternal aunt; Colon cancer in her father, paternal aunt, paternal aunt, and paternal grandmother; Coronary artery disease in her father; Esophageal cancer in an other family member; Heart attack in her paternal grandfather; Heart block in her father; Ovarian cancer in her maternal aunt; Pancreatic cancer in her mother; Throat cancer in her mother.  Prior Rehab/Hospitalizations Has the patient had prior rehab or hospitalizations prior to admission? No  Has the patient had major surgery during 100 days prior to admission? Yes             Current Medications  Current Facility-Administered Medications:  .  0.9 %  sodium chloride infusion, 250 mL, Intravenous, Continuous, Sanjuan Dame, MD, Stopped at 12/01/20 [id number] .  acetaminophen (TYLENOL) 160 MG/5ML solution 650 mg, 650 mg, Oral, Q6H PRN, [name] Merino, MD .  apixaban (ELIQUIS) tablet 5 mg, 5 mg, Oral, BID, Ghimire, Kuber, MD, 5 mg at 12/10/20 0900 .  atorvastatin (LIPITOR) tablet 10 mg, 10 mg, Oral, Daily, Ghimire, Kuber, MD, 10 mg at 12/10/20 0900 .  chlorhexidine (PERIDEX) 0.12 % solution 15 mL, 15 mL, Mouth Rinse, BID, Ghimire, Kuber, MD, 15 mL at 12/09/20 2134 .  Chlorhexidine Gluconate Cloth 2 % PADS 6 each, 6 each, Topical, Daily, Sanjuan Dame, MD, 6 each at 12/07/20 1002 .  docusate (COLACE) 50 MG/5ML liquid 100 mg, 100 mg, Oral, BID PRN, Hammons, Kimberly B, RPH .  levETIRAcetam (KEPPRA) tablet 500 mg, 500 mg, Oral, BID, Ghimire, Kuber, MD, 500 mg at 12/10/20 0900 .  lip balm (CARMEX) ointment, , Topical, PRN, Sanjuan Dame, MD, Given at 11/29/20 1414 .  MEDLINE mouth rinse, 15 mL, Mouth Rinse, q12n4p, Sanjuan Dame, MD, 15 mL at 12/09/20 1730 .  metoprolol tartrate (LOPRESSOR) tablet 25 mg, 25 mg, Oral, BID, Ghimire, Kuber, MD, 25 mg at 12/10/20 0900 .  multivitamin with minerals tablet 1 tablet, 1 tablet, Oral, Daily, [name] Merino, MD, 1 tablet at 12/10/20 0900 .  polyethylene glycol (MIRALAX / GLYCOLAX) packet 17 g, 17 g, Oral, Daily PRN, [name] Merino, MD .  Resource ThickenUp Clear, , Oral, PRN, [name] Merino, MD  Patients Current Diet:     Diet Order                  DIET DYS 2 Room service appropriate? No; Fluid consistency: Honey Thick  Diet effective now                  Precautions / Restrictions Precautions Precautions: Fall,Other (comment)  Precaution Comments: seizure Restrictions Weight Bearing Restrictions: No   Has the patient had 2 or more falls or a fall with injury in the past year? Yes  Prior Activity Level Community (5-7x/wk): Pt. was avtive in the community PTA  Prior Functional Level Self Care: Did the patient need help bathing, dressing, using the toilet or eating? Independent  Indoor Mobility: Did the patient need assistance with walking from room to room (with or without device)? Independent  Stairs: Did the patient need assistance with internal or external stairs (with or without device)? Needed some help  Functional Cognition: Did the patient need help planning regular tasks such as shopping or remembering to take medications? Independent  Home Assistive Devices / Equipment Home Equipment: Walker - 4 wheels,Grab bars - tub/shower  Prior Device Use: Indicate devices/aids used by the patient prior to current illness, exacerbation or injury? Walker  Current Functional Level Cognition  Overall Cognitive Status: Impaired/Different from baseline Current Attention Level: Selective Orientation Level: Oriented to time,Oriented to  place,Oriented to person,Disoriented to situation Following Commands: Follows one step commands consistently,Follows multi-step commands inconsistently Safety/Judgement: Decreased awareness of safety,Decreased awareness of deficits General Comments: Repeated cues provided throughout session. Difficulty following 2 cues at same time.    Extremity Assessment (includes Sensation/Coordination)  Upper Extremity Assessment: Generalized weakness  Lower Extremity Assessment: Generalized weakness    ADLs  Overall ADL's : Needs assistance/impaired Grooming: Wash/dry hands,Standing,Minimal assistance Grooming Details (indicate cue type and reason): MIN A for balance while standing at sink to wash hands Lower Body Bathing: Supervison/ safety,Set up,Min guard,Sitting/lateral leans Lower Body Bathing Details (indicate cue type and reason): simulated via anterior pericare from [facility] Upper Body Dressing : Moderate assistance,Sitting Lower Body Dressing: Maximal assistance,Sit to/from stand Lower Body Dressing Details (indicate cue type and reason): to don <>doff panties during toileting Toilet Transfer: Minimal assistance,RW,Stand-pivot Toilet Transfer Details (indicate cue type and reason): MIN A for stand pivot transfer from EOB<>BSC with Rw Toileting- Clothing Manipulation and Hygiene: Supervision/safety,Set up,Min guard,Sitting/lateral lean Toileting - Clothing Manipulation Details (indicate cue type and reason): anterior pericare via lateral leans on BSC Functional mobility during ADLs: Minimal assistance,Cueing for safety,Rolling walker General ADL Comments: pt continues to present with impaired balance, generalized weakness and cognitive deficits    Mobility  Overal bed mobility: Needs Assistance Bed Mobility: Supine to Sit Rolling: Min assist Supine to sit: HOB elevated,Min guard Sit to supine: Max assist General bed mobility comments: Extra time and cues to manage legs off EOB and pull on  bed rail to ascend trunk, min guard assist with HOB elevated.    Transfers  Overall transfer level: Needs assistance Equipment used: Rolling walker (2 wheeled) Transfers: Sit to/from Stand Sit to Stand: Min assist Stand pivot transfers: Min assist General transfer comment: Assist to power to standing with cues for hand placement as pt pulling up on RW initially, good carryover with subsequent reps. Stood from [employer], from chair x2.    Ambulation / Gait / Stairs / Wheelchair Mobility  Ambulation/Gait Ambulation/Gait assistance: Herbalist (Feet): 20 Feet (x3 bouts with seated rest break between each bout) Assistive device: Rolling walker (2 wheeled) Gait Pattern/deviations: Trunk flexed,Shuffle,Ataxic,Step-through pattern,Decreased stride length General Gait Details: Slow, ataxic like gait with short shuffling steps, cuing pt to take "marching" steps with tactile cues at hamstrings, success but poor carryover. Cued pt also to improve upright posture as she tends to flex at her trunk, momentary success. Unable to perform both cues simultaneously. Good carryover with  managing RW then taking steps sequencing turns. Gait velocity: reduced Gait velocity interpretation: <1.31 ft/sec, indicative of household ambulator    Posture / Balance Dynamic Sitting Balance Sitting balance - Comments: reliant on UE support Balance Overall balance assessment: Needs assistance Sitting-balance support: Feet supported,Bilateral upper extremity supported Sitting balance-Leahy Scale: Poor Sitting balance - Comments: reliant on UE support Postural control:  (anterior lean) Standing balance support: During functional activity,Bilateral upper extremity supported Standing balance-Leahy Scale: Poor Standing balance comment: BUE support on RW and external support    Special needs/care consideration Skin Abrasion to R leg; Ecchymosis to rand, arms, abdomen, Designated Visitor: [place] (from acute therapy documentation) Living Arrangements: Spouse/significant other Available Help at Discharge: Family,Available 24 hours/day Type of Home: [place]: One level Home Access: Stairs to enter Entrance Stairs-Rails: None Entrance Stairs-Number of Steps: 1 Bathroom Shower/Tub: Chiropodist: Standard Bathroom Accessibility: Yes How Accessible: Accessible via walker [place]: No  Discharge Living Setting Plans for Discharge Living Setting: Patient's home Type of Home at Discharge: House Discharge Home Layout: One level Discharge Home Access: Stairs to enter Entrance Stairs-Rails: None Entrance Stairs-Number of Steps: 1 Discharge Bathroom Shower/Tub: Tub/shower unit Discharge Bathroom Toilet: Standard Discharge Bathroom Accessibility: Yes How Accessible: Accessible via walker Does the patient have any problems obtaining your medications?: No  Social/Family/Support Systems Patient Roles: Spouse Contact Information: [phone] Anticipated Caregiver: Nayah Lukens (spouse) [place] Pier [phone] also very involved in care decisions) Anticipated Caregiver's Contact Information: [phone] Ability/Limitations of Caregiver: Can provide Mod A Caregiver Availability: 24/7 Discharge Plan Discussed with Primary Caregiver: Yes Is Caregiver In Agreement with Plan?: Yes  Goals Patient/Family Goal for Rehab: PT/OT Min A Expected length of stay: 12-14 days Pt/Family Agrees to Admission and willing to participate: Yes Program Orientation Provided & Reviewed with Pt/Caregiver Including Roles  & Responsibilities: Yes  Decrease burden of Care through IP rehab admission: Specialzed equipment needs   Possible need for SNF placement upon discharge: Not anticipated  Patient Condition: I have reviewed medical records from [facility] , spoken with CM, and patient and spouse. I met with  patient at the bedside for inpatient rehabilitation assessment.  Patient will benefit from ongoing PT and OT, can actively participate in 3 hours of therapy a day 5 days of the week, and can make measurable gains during the admission.  Patient will also benefit from the coordinated team approach during an Inpatient Acute Rehabilitation admission.  The patient will receive intensive therapy as well as Rehabilitation physician, nursing, social worker, and care management interventions.  Due to bladder management, bowel management, safety, skin/wound care, disease management, medication administration, pain management and patient education the patient requires 24 hour a day rehabilitation nursing.  The patient is currently min A- mod A  with mobility and basic ADLs.  Discharge setting and therapy post discharge at home with home health is anticipated.  Patient has agreed to participate in the Acute Inpatient Rehabilitation Program and will admit today.  Preadmission Screen Completed By:  Genella Mech, 12/10/2020 9:46 AM ______________________________________________________________________   Discussed status with Dr. Dagoberto Ligas on 12/11/20 at 9:30  and received approval for admission today.  Admission Coordinator:  Genella Mech, CCC-SLP, time 930/Date 12/10/2020   Assessment/Plan: Diagnosis: 1. Does the need for close, 24 hr/day Medical supervision in concert with the patient's rehab needs make it unreasonable for this patient to be served in a less intensive setting? Yes 2. Co-Morbidities requiring  supervision/potential complications: PEA arrest, dysphagia, Seizure, HNT< HLD, hepatic steatosis, GERD 3. Due to bladder management, bowel management, safety, skin/wound care, disease management, medication administration, pain management and patient education, does the patient require 24 hr/day rehab nursing? Yes 4. Does the patient require coordinated care of a physician, rehab nurse, PT, OT, and SLP to  address physical and functional deficits in the context of the above medical diagnosis(es)? Yes Addressing deficits in the following areas: balance, endurance, locomotion, strength, transferring, bathing, dressing, feeding, grooming, toileting, cognition, speech, language and swallowing 5. Can the patient actively participate in an intensive therapy program of at least 3 hrs of therapy 5 days a week? Yes 6. The potential for patient to make measurable gains while on inpatient rehab is good 7. Anticipated functional outcomes upon discharge from inpatient rehab: min assist PT, min assist OT, min assist SLP 8. Estimated rehab length of stay to reach the above functional goals is: 12-14 days 9. Anticipated discharge destination: Home 10. Overall Rehab/Functional Prognosis: good   MD Signature:

## 2020-12-10 NOTE — Progress Notes (Signed)
Patient arrived on unit, oriented to unit. Reviewed medications, therapy schedule, rehab routine and plan of care. States an understanding of information reviewed. No complications noted at this time. Patient reports no pain and is AX3 [name]  

## 2020-12-10 NOTE — H&P (Addendum)
Physical Medicine and Rehabilitation Admission H&P    CC: Debility   HPI: Kristine Francis is a 80 year old female with history of HTN, asthma, hepatic steatosis, vestibulitis, CVA who was admitted on 11/25/20 with right sided weakness, right gaze preference and difficulty speaking. In ED noted to have difficulty handling oral secretions and had tonic clonic seizure loss of pulse requiring 2 minutes of chest compression and intubation for airway protection.   She received IV ativan and Keppra. CT head/CTA negative for LVO or acute changes. MRI brain done revealing multiple foci of susceptibility in bilateral cerebral hemispheres more pronounced in left occipital region question chronic microhemorrhages related to HTN or amyloid angiopathy. She was started on antibiotics for aspiration PNA and acyclovir due to concerns of meningitis and monitored on LT-EEG. EEG without seizures and showed evidence of moderate to severe encephalopathy.  She developed A fib in ICU and Eliquis added due to CHADS-VASc-6. Work up negative for infection and she was extubated to 4 L oxygen by 01/08. Bouts of agitation due to delirium have resolved. She was started on dysphagia 2, honey liquids but intake variable. Therapy ongoing and patient limited by weakness, ataxia, vertigo with gaze instability and cognitive deficits. CIR recommended due to functional decline in mobility and ADLs.    Per pt, and husband who was at bedside, LBM yesterday- voiding OK0 no dysuria.  Denies pain- ate <50% lunch, but said wasn't through with it.  According to her husband, only skin issue is a scratch on LLE- on shin.    Review of Systems  Constitutional: Negative for chills and fever.  HENT: Negative for hearing loss.   Eyes: Negative for blurred vision, double vision and pain.  Respiratory: Negative for cough and shortness of breath.   Cardiovascular: Negative for chest pain and palpitations.  Gastrointestinal: Positive for  heartburn. Negative for abdominal pain and constipation.  Genitourinary: Negative for frequency.  Musculoskeletal: Positive for joint pain. Negative for back pain and myalgias.  Skin: Negative for rash.  Neurological: Positive for dizziness (occasionally). Negative for headaches.  All other systems reviewed and are negative.    Past Medical History:  Diagnosis Date  . Allergy   . Asthma    sees Dr. Tiajuana Francis - as child per pt  . At high risk for falls    unstable gait   . Ataxia   . Cataract    removed bilaterally   . Diverticulosis   . Emphysema   . Gait abnormality 05/14/2013   Ms.Kristine Francis a patient of Dr. Linda Francis presented first interpreter thousand 13 with a gait dysfunction, she also had an episodic confusion and Loss device. Exam found a mild nystagmus and she was referred to ophthalmology on [id number], brain MRI was normal nystagmus was a secondary diagnosis to vestibulitis, ear nose and throat has followed and had seen the patient. At the time of the nystagmus the patie  . GERD (gastroesophageal reflux disease)   . Hepatic steatosis   . Hyperlipidemia    on atorvastatin   . Hypertension    on losartan   . Iron deficiency anemia, unspecified   . Kidney cysts 11/16/12   small cyst on left  . Nystagmus, end-position   . Osteopenia   . Renal cyst   . Stroke ([place]) 01/2017   mild     Past Surgical History:  Procedure Laterality Date  . ANAL RECTAL MANOMETRY N/A 12/20/2017   Procedure: ANO RECTAL MANOMETRY;  Surgeon: Mauri Pole,  MD;  Location: [facility];  Service: Endoscopy;  Laterality: N/A;  . BREAST BIOPSY Bilateral 1970's  . CATARACT EXTRACTION Bilateral LT:02/14/13,RT:02/21/13  . CERVICAL SPINE SURGERY  2010  . COLONOSCOPY  03-12-14   per Dr. Olevia Perches, diverticulosis only, repeat 5 yrs   . EYE SURGERY Bilateral 01/2013&02/2013   left then right  . HERNIA REPAIR  Oct '14-left, remote-right   years ago right; left done '14  . SKIN CANCER EXCISION  05/13/13   FACE,  basal cell  . TONSILLECTOMY      Family History  Problem Relation Age of Onset  . Colon cancer Father   . Coronary artery disease Father   . Heart block Father   . Pancreatic cancer Mother   . Throat cancer Mother   . Colon cancer Paternal Grandmother   . Colon cancer Paternal Aunt        x 2  . Cancer - Lung Paternal Aunt   . Breast cancer Paternal Aunt   . Colon cancer Paternal Aunt   . Ovarian cancer Maternal Aunt   . Heart attack Paternal Grandfather   . Esophageal cancer Other   . Colon polyps Neg Hx   . Rectal cancer Neg Hx   . Stomach cancer Neg Hx     Social History:  Married. Independent PTA--uses walker occasionally. 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 Helps babysit grandchildren twice a week. She reports that she has never smoked. She has never used smokeless tobacco. She reports that she does not drink alcohol and does not use drugs.    Allergies  Allergen Reactions  . Contrast Media [Iodinated Diagnostic Agents] Other (See Comments)    Congestion .Marland KitchenMarland Kitchenpatient stated it irritated her eyes and voice  . Iohexol Other (See Comments)    Gi upset  . Lisinopril Cough    Medications Prior to Admission  Medication Sig Dispense Refill  . apixaban (ELIQUIS) 5 MG TABS tablet Take 1 tablet (5 mg total) by mouth 2 (two) times daily. 60 tablet 0  . [START ON 12/11/2020] atorvastatin (LIPITOR) 10 MG tablet Take 1 tablet (10 mg total) by mouth daily.    Marland Kitchen EPINEPHrine 0.3 mg/0.3 mL IJ SOAJ injection Inject 0.3 mg into the muscle as needed for anaphylaxis.    Marland Kitchen levETIRAcetam (KEPPRA) 500 MG tablet Take 1 tablet (500 mg total) by mouth 2 (two) times daily. 60 tablet 2  . metoprolol tartrate (LOPRESSOR) 25 MG tablet Take 1 tablet (25 mg total) by mouth 2 (two) times daily. 60 tablet 0  . [START ON 12/11/2020] Multiple Vitamin (MULTIVITAMIN WITH MINERALS) TABS tablet Take 1 tablet by mouth daily.      Drug Regimen Review  Drug regimen was reviewed and  remains appropriate with no significant issues identified  Home: Home Living Family/patient expects to be discharged to:: Private residence Living Arrangements: Spouse/significant other   Functional History:    Functional Status:  Mobility:          ADL:    Cognition: Cognition Orientation Level: Oriented to person,Oriented to place,Oriented to time,Disoriented to situation     Blood pressure 136/72, pulse 68, temperature 97.6 F (36.4 C), temperature source Oral, resp. rate 18, height 5\' 1"  (1.549 m), weight 42.5 kg, SpO2 100 %. Physical Exam Vitals and nursing note reviewed. Exam conducted with a chaperone present.  Constitutional:      Appearance: She is cachectic.     Comments: Frail appearing with flexed posture. Sunken cheeks with occasional mouth  breathing.   Frail small older female sitting up slightly in bed- answering questions, but per notes, no carry over- husband at bedside, NAD <50% lunch eaten of D2 honey thick liquids  HENT:     Head: Normocephalic and atraumatic.     Comments: Smile appears equal; tongue midline    Right Ear: External ear normal.     Left Ear: External ear normal.     Nose: Nose normal. No congestion or rhinorrhea.     Mouth/Throat:     Mouth: Mucous membranes are dry.     Pharynx: Oropharynx is clear. No oropharyngeal exudate.  Eyes:     General:        Right eye: No discharge.        Left eye: No discharge.     Extraocular Movements: Extraocular movements intact.     Comments: Horizontal nystagmus B/L with EOMs  Neck:     Comments: Decreased flexion (prior neck surgery) Cardiovascular:     Rate and Rhythm: Normal rate. Rhythm irregular.     Heart sounds: Normal heart sounds. No murmur heard. No gallop.   Pulmonary:     Breath sounds: No stridor.     Comments: Coarse throughout- trace rhonchi B/L, but good air movement B/L- no Wheezes or rales Abdominal:     Comments: Soft, NT, ND, (+)BS -normoactive  Musculoskeletal:      Cervical back: Normal range of motion. No rigidity.     Comments: UEs- 5-/5 in biceps, triceps, WE, grip and finger abd B/L LEs- 5-/5 in HF, KE, KF, DF and PF B/L  Skin:    General: Skin is warm.     Comments: L anterior shin small scab with dried blood-covered with large bandage Due to needing to go to bathroom immediately, could not assess backside  Neurological:     Mental Status: She is alert.     Comments: Dysphonia noted (close to baseline per husband). Able to answer orientation questions and follow simple commands. Right facial weakness with decrease in fine motor movements RUE.  Lateral nystagmus worse on the left. Has decreased insight into deficits.   Pt answers question, but per therapy notes, and what I saw from leaving and coming back, pt has no carry over/poor memory Sensation intact to light touch in all 4 extremities   Psychiatric:     Comments: Appropriate, but slightly flat affect     Results for orders placed or performed during the hospital encounter of 11/25/20 (from the past 48 hour(s))  Glucose, capillary     Status: Abnormal   Collection Time: 12/08/20 10:16 PM  Result Value Ref Range   Glucose-Capillary 145 (H) 70 - 99 mg/dL    Comment: Glucose reference range applies only to samples taken after fasting for at least 8 hours.  Glucose, capillary     Status: Abnormal   Collection Time: 12/09/20  1:20 AM  Result Value Ref Range   Glucose-Capillary 123 (H) 70 - 99 mg/dL    Comment: Glucose reference range applies only to samples taken after fasting for at least 8 hours.  Glucose, capillary     Status: Abnormal   Collection Time: 12/09/20  3:37 AM  Result Value Ref Range   Glucose-Capillary 107 (H) 70 - 99 mg/dL    Comment: Glucose reference range applies only to samples taken after fasting for at least 8 hours.  Glucose, capillary     Status: Abnormal   Collection Time: 12/09/20  7:46 AM  Result Value  Ref Range   Glucose-Capillary 102 (H) 70 - 99 mg/dL     Comment: Glucose reference range applies only to samples taken after fasting for at least 8 hours.  Glucose, capillary     Status: None   Collection Time: 12/09/20 11:58 AM  Result Value Ref Range   Glucose-Capillary 94 70 - 99 mg/dL    Comment: Glucose reference range applies only to samples taken after fasting for at least 8 hours.  Glucose, capillary     Status: None   Collection Time: 12/09/20  3:59 PM  Result Value Ref Range   Glucose-Capillary 95 70 - 99 mg/dL    Comment: Glucose reference range applies only to samples taken after fasting for at least 8 hours.  Glucose, capillary     Status: Abnormal   Collection Time: 12/09/20  8:17 PM  Result Value Ref Range   Glucose-Capillary 125 (H) 70 - 99 mg/dL    Comment: Glucose reference range applies only to samples taken after fasting for at least 8 hours.   Comment 1 Notify RN    Comment 2 Document in Chart   Glucose, capillary     Status: Abnormal   Collection Time: 12/10/20 12:23 AM  Result Value Ref Range   Glucose-Capillary 101 (H) 70 - 99 mg/dL    Comment: Glucose reference range applies only to samples taken after fasting for at least 8 hours.   Comment 1 Notify RN    Comment 2 Document in Chart   Glucose, capillary     Status: Abnormal   Collection Time: 12/10/20  3:37 AM  Result Value Ref Range   Glucose-Capillary 100 (H) 70 - 99 mg/dL    Comment: Glucose reference range applies only to samples taken after fasting for at least 8 hours.   Comment 1 Notify RN    Comment 2 Document in Chart   Glucose, capillary     Status: None   Collection Time: 12/10/20  7:47 AM  Result Value Ref Range   Glucose-Capillary 81 70 - 99 mg/dL    Comment: Glucose reference range applies only to samples taken after fasting for at least 8 hours.  Glucose, capillary     Status: None   Collection Time: 12/10/20 11:56 AM  Result Value Ref Range   Glucose-Capillary 93 70 - 99 mg/dL    Comment: Glucose reference range applies only to samples  taken after fasting for at least 8 hours.   No results found.     Medical Problem List and Plan: 1.  Debility and encephalopathy - moderate to severe secondary to tonic clonic seizure, amyloid angiopathy, pneumonia and new onset Afib with RVR  -patient may  shower  -ELOS/Goals: 12-14 days- min A to mod I 2.  Antithrombotics: -DVT/anticoagulation:  Pharmaceutical: Other (comment)--on Eliquis- also for Afib with RVR  -antiplatelet therapy: N/a 3. Pain Management: N/A 4. Mood: LCSW to follow for evaluation and support.   -antipsychotic agents: N/A 5. Neuropsych: This patient is not fully capable of making decisions on her own behalf. 6. Skin/Wound Care: Routine pressure relief measures.  7. Fluids/Electrolytes/Nutrition: Monitor I/O. Check lytes in am.  8. PAF: New onset--continue Metoprolol and eliquis BID. 9. Dysphagia: On D2, honey liquids. Monitor hydration with check of lytes in am and next week. 10.  Hyponateremia: Will recheck Na in am--question dilutional.  11. Acute on chronic Iron deficiency anemia:H/H down from 11-->8.7. Check stool guaiacs. Monitor for signs of bleeding. Will add iron supplement. 12. Leukocytosis: Monitor for  fevers and other signs of infection.  Recheck WBC in am.  13. Seizure prophylaxis: Stable on Keppra. Tolerating without SE.   Bary Leriche- PA-C 12/10/2020  I have personally performed a face to face diagnostic evaluation of this patient and formulated the key components of the plan.  Additionally, I have personally reviewed laboratory data, imaging studies, as well as relevant notes and concur with the physician assistant's documentation above.    Courtney Heys, MD 12/10/2020

## 2020-12-10 NOTE — TOC Transition Note (Signed)
Transition of Care [facility]) - CM/SW Discharge Note   Patient Details  Name: Kristine Francis MRN: [id number] Date of Birth: 15-Sep-1941  [facility] CM/SW Contact:  Pollie Friar, RN Phone Number: 12/10/2020, 11:08 AM   Clinical Narrative:    Pt is discharging to CIR today. CM signing off.   Final next level of care: IP Rehab Facility Barriers to Discharge: No Barriers Identified   Patient Goals and CMS Choice Patient states their goals for this hospitalization and ongoing recovery are:: Pt states she would like to go home to be with her grandparents. CMS Medicare.gov Compare Post Acute Care list provided to:: Patient Represenative (must comment) (Pt's son and husband) Choice offered to / list presented to : [place]  Discharge Placement                       Discharge Plan and Services   Discharge Planning Services: CM Consult Post Acute Care Choice: [place]                               Social Determinants of Health (SDOH) Interventions     Readmission Risk Interventions No flowsheet data found.

## 2020-12-10 NOTE — Plan of Care (Signed)
  Problem: Role Relationship: Goal: Method of communication will improve Outcome: Progressing   Problem: Respiratory: Goal: Ability to maintain a clear airway and adequate ventilation will improve Outcome: Progressing   Problem: Activity: Goal: Ability to tolerate increased activity will improve Outcome: Progressing

## 2020-12-10 NOTE — Discharge Summary (Signed)
Physician Discharge Summary  JAILIN KLOEPPEL [id number] DOB: December 23, 1940 DOA: 11/25/2020  PCP: [name], MD  Admit date: 11/25/2020 Discharge date: 12/10/2020  Admitted From: home  Disposition:  CIR  Recommendations for Outpatient Follow-up:  1. Follow up with PCP in 1-2 weeks after discharge  2. Please obtain BMP/CBC in one week 3. Referral sent to neurology follow up.  Home Health:NA  Equipment/Devices:NA   Discharge Condition:stable   CODE STATUS:full code  Diet recommendation: low salt diet, supplemental nutrition   Discharge Summary: 80 year old female with history of COPD, history of stroke, hypertension and neuropathy with balance issues brought to the ER with right-sided gaze deviation with aphasia.  She was at home, her granddaughter heard her scream and found patient bent over on a table. She was brought to the ER as code stroke.  While in the emergency room she had a witnessed seizure.  Became bradycardic and then lost her pulse. She was intubated in the ER, CPR for 2 minutes with ROSC.  Treated with Ativan and Keppra.  Neurology was consulted in the ER and recommended to continue [place]. 1/5-1/8, intubated in the ICU, delirium. 1/11, transferred to [facility] service. Passed swallow evaluation , coretrak to be removed 1/15 Now normalized and ready for rehab.   Assessment & Plan of care:   # Grand mal seizure: Witnessed on admission MRI brain without any acute findings EEG with moderate to severe diffuse encephalopathy Treated with IV Keppra now transitioned to oral Keppra.  No more seizures now.  Will need neurology follow-up after discharge.  referral has been sent to [facility] neurology office.  # Acute metabolic encephalopathy: Suspected postictal state.  She is currently alert awake and oriented.  Mental status improved.  No focal deficits.  Passed swallow evaluation.  Started on dysphagia diet.   Encourage oral feeding.  Aspiration precautions.  #  Dysphagia/aspiration pneumonia/acute hypoxemic respiratory failure: Treated with 5 days of antibiotics.  Currently on room air.  She has some leukocytosis but no fever.  All-time aspiration precautions.  # Cardiac arrest: PEA arrest secondary to seizure.  Echocardiogram was normal.  Grade 2 diastolic dysfunction.  # Paroxysmal A. fib: Rate controlled.  sinus rhythm. Started on Eliquis.  On metoprolol.  TSH is normal.   # Ataxia/neuropathy: Chronic problem.  B12 normal.  MRI with no acute findings.  Aggressive rehab and outpatient follow-up.  Patient is medically stable to transfer to acute inpatient rehab level of care today.   Discharge Diagnoses:  Principal Problem:   Grand mal seizure [facility]) Active Problems:   ANEMIA-IRON DEFICIENCY   ATAXIA   Acute encephalopathy   Cardiac arrest (HCC)   Malnutrition of moderate degree   COPD (chronic obstructive pulmonary disease) ([place])    Discharge Instructions  Discharge Instructions    Ambulatory referral to Neurology   Complete by: As directed    An appointment is requested in approximately: 4 weeks   Diet - low sodium heart healthy   Complete by: As directed    Increase activity slowly   Complete by: As directed      Allergies as of 12/10/2020      Reactions   Contrast Media [iodinated Diagnostic Agents] Other (See Comments)   Congestion .Marland KitchenMarland Kitchenpatient stated it irritated her eyes and voice   Iohexol Other (See Comments)   Gi upset   Lisinopril Cough      Medication List    STOP taking these medications   ibuprofen 800 MG tablet Commonly known as: ADVIL  ketoconazole 2 % cream Commonly known as: NIZORAL   nitrofurantoin (macrocrystal-monohydrate) 100 MG capsule Commonly known as: Macrobid     TAKE these medications   apixaban 5 MG Tabs tablet Commonly known as: ELIQUIS Take 1 tablet (5 mg total) by mouth 2 (two) times daily.   atorvastatin 10 MG tablet Commonly known as: LIPITOR Take 1 tablet (10 mg total) by  mouth daily. Start taking on: December 11, 2020   EPINEPHrine 0.3 mg/0.3 mL Soaj injection Commonly known as: EPI-PEN Inject 0.3 mg into the muscle as needed for anaphylaxis.   levETIRAcetam 500 MG tablet Commonly known as: KEPPRA Take 1 tablet (500 mg total) by mouth 2 (two) times daily.   metoprolol tartrate 25 MG tablet Commonly known as: LOPRESSOR Take 1 tablet (25 mg total) by mouth 2 (two) times daily.   multivitamin with minerals Tabs tablet Take 1 tablet by mouth daily. Start taking on: December 11, 2020       Allergies  Allergen Reactions  . Contrast Media [Iodinated Diagnostic Agents] Other (See Comments)    Congestion .Marland KitchenMarland Kitchenpatient stated it irritated her eyes and voice  . Iohexol Other (See Comments)    Gi upset  . Lisinopril Cough    Consultations:  PCCM  Cardiology   Procedures/Studies: MR BRAIN W WO CONTRAST  Result Date: 11/26/2020 CLINICAL DATA:  Seizure, nontraumatic. EXAM: MRI HEAD WITHOUT AND WITH CONTRAST TECHNIQUE: Multiplanar, multiecho pulse sequences of the brain and surrounding structures were obtained without and with intravenous contrast. CONTRAST:  63mL GADAVIST GADOBUTROL 1 MMOL/ML IV SOLN COMPARISON:  Head CT November 25, 2020. FINDINGS: Brain: No acute infarction, hemorrhage, hydrocephalus, extra-axial collection or mass lesion. Remote lacunar infarct in the right basal ganglia region. Scattered foci of T2 hyperintensity are seen within the white matter of the cerebral hemispheres and within the pons, nonspecific, most likely related to chronic small vessel ischemia. Multiple foci of susceptibility artifact are noted in the bilateral cerebral hemispheres, more pronounced in the left occipital region where there is associated subtle T2 hyperintensity of the subcortical white matter and some of the foci appear to be cortical. No focus of abnormal contrast enhancement. Vascular: Normal flow voids. Skull and upper cervical spine: Normal marrow signal.  Sinuses/Orbits: Bilateral lens surgery. Paranasal sinuses are essentially clear. IMPRESSION: 1. Multiple foci of susceptibility artifact in the bilateral cerebral hemispheres, more pronounced in the left occipital region where there is associated subtle T2 hyperintensity of the subcortical white matter and some of the foci appear to be cortical. Findings may represent chronic microhemorrhages related to hypertension or amyloid angiopathy. 2. Remote lacunar infarct in the right basal ganglia region. 3. Scattered foci of T2 hyperintensity within the white matter of the cerebral hemispheres and pons, nonspecific, most likely related to chronic small vessel ischemia. Electronically Signed   By: Pedro Earls M.D.   On: 11/26/2020 15:25   DG Chest Port 1 View  Result Date: 12/02/2020 CLINICAL DATA:  Hypoxia in this 80 year old female EXAM: PORTABLE CHEST 1 VIEW COMPARISON:  November 26, 2020 FINDINGS: Feeding tube passes through in off the field of the radiograph into the upper abdomen. Since January 6th the endotracheal tube is been removed. Cardiac leads project over the patient's chest. Cardiomediastinal contours are stable with dense opacification of the LEFT retrocardiac region. The image is rotated to the LEFT. Increased interstitial markings since the previous study with suggestion of cystic lucencies at the RIGHT lung base perhaps underlying emphysema. Obscured, partially obscured RIGHT hemidiaphragm with graded  opacity in the RIGHT chest. Cardiomediastinal contours are stable. No acute skeletal process to the extent evaluated. IMPRESSION: 1. Increasing interstitial markings since the previous study along with patchy airspace opacities findings could represent asymmetric pulmonary edema or multifocal infection. 2. Worsening opacification in the retrocardiac region and at the RIGHT lung base may represent pneumonitis/pneumonia or volume loss 3. Bilateral effusions. 4. Cystic changes the RIGHT  lung base, of uncertain significance, potentially related to worsening of interstitial changes overlying pulmonary emphysema. Post infectious pneumatocele or sequela of barotrauma are considered. CT of the chest may be helpful for further evaluation. These results will be called to the ordering clinician or representative by the Radiologist Assistant, and communication documented in the PACS or [employer]. Electronically Signed   By: Zetta Bills M.D.   On: 12/02/2020 09:18   DG CHEST PORT 1 VIEW  Result Date: 11/26/2020 CLINICAL DATA:  Check endotracheal tube placement EXAM: PORTABLE CHEST 1 VIEW COMPARISON:  Film from earlier in the same day. FINDINGS: Endotracheal tube is noted 17 mm above the carina. This previously lie at approximately 3 cm above the carina. Gastric catheter it is noted extending into the stomach. Cardiac shadow is within normal limits. Decreased aeration of the right lung is noted. This may be related to some bronchial mucosal plugging. Old rib fractures are seen. IMPRESSION: Decreased aeration in the right lung although not felt to be related to endotracheal tube placement as it remains in the mid to distal trachea. No other focal abnormality is noted. Electronically Signed   By: Inez Catalina M.D.   On: 11/26/2020 11:19   DG Chest Port 1 View  Result Date: 11/26/2020 CLINICAL DATA:  Respiratory failure EXAM: PORTABLE CHEST 1 VIEW COMPARISON:  11/25/2020 FINDINGS: Endotracheal tube with tip between the clavicular heads and carina. The enteric tube at least reaches the stomach. Extensive artifact from EKG leads. Hyperinflation. Indistinct patchy right lung opacity. Background scarring and bronchiectasis seen in the apical lungs on CTA of the neck. No visible effusion or pneumothorax. IMPRESSION: 1. Unremarkable hardware positioning. 2. Increased opacity in the right lung associated with presumed right lung aspiration seen on CTA of the neck from yesterday. Electronically Signed    By: Monte Fantasia M.D.   On: 11/26/2020 04:34   DG Chest Portable 1 View  Result Date: 11/25/2020 CLINICAL DATA:  Tube placement EXAM: PORTABLE CHEST 1 VIEW COMPARISON:  2013 FINDINGS: Endotracheal tube overlies the midthoracic trachea. Enteric tube passes below the diaphragm with tip out of field of view. Multiple monitoring leads/pads overlie the chest. Hyperinflated lungs with interstitial prominence likely reflecting COPD. No focal consolidation. No pleural effusion or pneumothorax. Heart size is normal. IMPRESSION: Lines and tubes as above. COPD. No acute process in the chest. Electronically Signed   By: Macy Mis M.D.   On: 11/25/2020 15:07   DG Swallowing Func-Speech Pathology  Result Date: 12/04/2020 Objective Swallowing Evaluation: Type of Study: MBS-Modified Barium Swallow Study  Patient Details Name: DORTHELLA ARCHACKI MRN: [id number] Date of Birth: 1940/12/29 Today's Date: 12/04/2020 Time: SLP Start Time (ACUTE ONLY): [id number] -SLP Stop Time (ACUTE ONLY): 1257 SLP Time Calculation (min) (ACUTE ONLY): 21 min Past Medical History: Past Medical History: Diagnosis Date . Allergy  . Asthma   sees Dr. Tiajuana Amass - as child per pt . At high risk for falls   unstable gait  . Ataxia  . Cataract   removed bilaterally  . Diverticulosis  . Emphysema  . Gait abnormality 05/14/2013  Ms.Lilyan Punt  a patient of Dr. Linda Hedges presented first interpreter thousand 13 with a gait dysfunction, she also had an episodic confusion and Loss device. Exam found a mild nystagmus and she was referred to ophthalmology on 03-2812, brain MRI was normal nystagmus was a secondary diagnosis to vestibulitis, ear nose and throat has followed and had seen the patient. At the time of the nystagmus the patie . GERD (gastroesophageal reflux disease)  . Hepatic steatosis  . Hyperlipidemia   on atorvastatin  . Hypertension   on losartan  . Iron deficiency anemia, unspecified  . Kidney cysts 11/16/12  small cyst on left . Nystagmus, end-position  .  Osteopenia  . Renal cyst  . Stroke ([place]) 01/2017  mild  Past Surgical History: Past Surgical History: Procedure Laterality Date . ANAL RECTAL MANOMETRY N/A 12/20/2017  Procedure: ANO RECTAL MANOMETRY;  Surgeon: Mauri Pole, MD;  Location: [facility] ENDOSCOPY;  Service: Endoscopy;  Laterality: N/A; . BREAST BIOPSY Bilateral 1970's . CATARACT EXTRACTION Bilateral LT:02/14/13,RT:02/21/13 . CERVICAL SPINE SURGERY  2010 . COLONOSCOPY  03-12-14  per Dr. Olevia Perches, diverticulosis only, repeat 5 yrs  . EYE SURGERY Bilateral 01/2013&02/2013  left then right . HERNIA REPAIR  Oct '14-left, remote-right  years ago right; left done '14 . SKIN CANCER EXCISION  05/13/13  FACE, basal cell . TONSILLECTOMY   HPI: 80 yo female admitted 11/25/20 after becoming unresponsive. [name], unable to use RUE. Intubated in ED. 2 minutes of CPR. MRI Multiple foci of susceptibility artifact in the bilateral cerebral hemispheres more pronounced in occipital region. Remote lacunar infarct in the right basal ganglia region, PMH: CVA (2018), HTN, HLD, gait abnormality, anemia, emphysema, asthma, GERD, diverticulosis, renal cyst, osteopenia. ETT 1/5-8/22  Subjective: Pt awake, follows directions. Perseverative on calling out for [name] (spouse) Assessment / Plan / Recommendation CHL IP CLINICAL IMPRESSIONS 12/04/2020 Clinical Impression Pt exhibits an acute dysphagia on chronic cervical anomalies resulting in laryngeal penetration, brief aspiration and pharyngeal residue. Cervical vertebrae is significantly kyphotic with noted cervical hardward (2010). Despite intermittently reduced tongue base retraction and incomplete epiglottic deflection (horizontal position), she is able to protect airway during the swallow in majority of trias. The cohesiveness of bolus with mistimed onset is affected allowing penetration before swallow to vocal cords fallling below briefly. Aggregation of nectar thick to pyriform sinuses with barium squeezed into vestible and below  cords briefly. Her chin tuck posture limited cervical ROM. There was insufficient sensation with penetration episodes. Residue was mildly increased with honey thick but not significant and resulted in more timely swallow through pharynx. There is a prominent cricopharyngeus muscle. Recommend Dys 2, honey thick liquids, multiple swallows and intermittent throat clear and crushed meds. SLP Visit Diagnosis Dysphagia, pharyngeal phase (R13.13) Attention and concentration deficit following -- Frontal lobe and executive function deficit following -- Impact on safety and function Moderate aspiration risk   CHL IP TREATMENT RECOMMENDATION 12/04/2020 Treatment Recommendations Therapy as outlined in treatment plan below   Prognosis 12/04/2020 Prognosis for Safe Diet Advancement (No Data) Barriers to Reach Goals (No Data) Barriers/Prognosis Comment -- CHL IP DIET RECOMMENDATION 12/04/2020 SLP Diet Recommendations Dysphagia 2 (Fine chop) solids;Honey thick liquids Liquid Administration via Cup;No straw Medication Administration Crushed with puree Compensations Slow rate;Small sips/bites;Multiple dry swallows after each bite/sip;Clear throat intermittently Postural Changes Seated upright at 90 degrees   CHL IP OTHER RECOMMENDATIONS 12/04/2020 Recommended Consults -- Oral Care Recommendations Oral care BID Other Recommendations --   CHL IP FOLLOW UP RECOMMENDATIONS 12/04/2020 Follow up Recommendations Inpatient Rehab  CHL IP FREQUENCY AND DURATION 12/04/2020 Speech Therapy Frequency (ACUTE ONLY) min 2x/week Treatment Duration 2 weeks      CHL IP ORAL PHASE 12/04/2020 Oral Phase Impaired Oral - Pudding Teaspoon -- Oral - Pudding Cup -- Oral - Honey Teaspoon -- Oral - Honey Cup -- Oral - Nectar Teaspoon Lingual/palatal residue Oral - Nectar Cup Lingual/palatal residue Oral - Nectar Straw -- Oral - Thin Teaspoon Decreased bolus cohesion Oral - Thin Cup Decreased bolus cohesion Oral - Thin Straw -- Oral - Puree -- Oral - Mech Soft Reduced  posterior propulsion Oral - Regular -- Oral - Multi-Consistency -- Oral - Pill -- Oral Phase - Comment --  CHL IP PHARYNGEAL PHASE 12/04/2020 Pharyngeal Phase Impaired Pharyngeal- Pudding Teaspoon -- Pharyngeal -- Pharyngeal- Pudding Cup -- Pharyngeal -- Pharyngeal- Honey Teaspoon -- Pharyngeal -- Pharyngeal- Honey Cup Pharyngeal residue - pyriform;Pharyngeal residue - valleculae;Reduced epiglottic inversion;Reduced pharyngeal peristalsis Pharyngeal -- Pharyngeal- Nectar Teaspoon Reduced epiglottic inversion;Reduced pharyngeal peristalsis;Delayed swallow initiation-pyriform sinuses;Reduced tongue base retraction Pharyngeal Material does not enter airway Pharyngeal- Nectar Cup Pharyngeal residue - valleculae;Penetration/Aspiration during swallow;Pharyngeal residue - pyriform;Reduced pharyngeal peristalsis;Reduced tongue base retraction;Reduced epiglottic inversion Pharyngeal Material enters airway, passes BELOW cords then ejected out;Material enters airway, CONTACTS cords and not ejected out Pharyngeal- Nectar Straw -- Pharyngeal -- Pharyngeal- Thin Teaspoon Reduced epiglottic inversion;Reduced pharyngeal peristalsis Pharyngeal -- Pharyngeal- Thin Cup Reduced pharyngeal peristalsis;Reduced epiglottic inversion;Penetration/Aspiration before swallow Pharyngeal Material enters airway, passes BELOW cords then ejected out;Material enters airway, CONTACTS cords and not ejected out Pharyngeal- Thin Straw -- Pharyngeal -- Pharyngeal- Puree -- Pharyngeal -- Pharyngeal- Mechanical Soft Penetration/Aspiration during swallow;Reduced epiglottic inversion Pharyngeal -- Pharyngeal- Regular -- Pharyngeal -- Pharyngeal- Multi-consistency -- Pharyngeal -- Pharyngeal- Pill -- Pharyngeal -- Pharyngeal Comment --  CHL IP CERVICAL ESOPHAGEAL PHASE 12/04/2020 Cervical Esophageal Phase Impaired Pudding Teaspoon -- Pudding Cup -- Honey Teaspoon -- Honey Cup -- Nectar Teaspoon -- Nectar Cup -- Nectar Straw -- Thin Teaspoon -- Thin Cup -- Thin  Straw -- Puree -- Mechanical Soft -- Regular -- Multi-consistency -- Pill -- Cervical Esophageal Comment -- [name] 12/04/2020, 2:25 PM Orbie Pyo Litaker M.Ed Actor Pager [phone] Office [phone]              EEG adult  Result Date: 11/26/2020 [name], MD     11/26/2020  9:57 AM Patient Name: JOSIAH WOJTASZEK MRN: [id number] Epilepsy Attending: [name] Referring Physician/Provider: Dr. Donnetta Simpers Date: 11/25/2020 Duration: 24.22 minutes Patient history: 80 year old female with new onset seizures.  EEG to evaluate for seizures. Level of alertness: comatose AEDs during EEG study: Keppra Technical aspects: This EEG study was done with scalp electrodes positioned according to the 10-20 International system of electrode placement. Electrical activity was acquired at a sampling rate of 500Hz  and reviewed with a high frequency filter of 70Hz  and a low frequency filter of 1Hz . EEG data were recorded continuously and digitally stored. Description: EEG showed continuous generalized 3 to 6 Hz theta-delta slowing admixed with 15 to 18 Hz generalized beta activity.  Hyperventilation and photic stimulation were not performed.   ABNORMALITY -Continuous slow, generalized IMPRESSION: This study is suggestive of moderate to severe diffuse encephalopathy, nonspecific etiology.  No seizures or epileptiform discharges were seen throughout the recording. Priyanka Barbra Sarks   Overnight EEG with video  Result Date: 11/26/2020 [name], MD     11/26/2020  1:07 PM Patient Name: KASARA SCHOMER MRN: [id number] Epilepsy Attending: [name] Referring Physician/Provider: Dr.  Donnetta Simpers Duration:  11/25/2020 1723 to 11/26/2020 1228  Patient history: 80 year old female with new onset seizures.  EEG to evaluate for seizures.  Level of alertness: comatose  AEDs during EEG study: Keppra  Technical aspects: This EEG study was done with scalp electrodes  positioned according to the 10-20 International system of electrode placement. Electrical activity was acquired at a sampling rate of 500Hz  and reviewed with a high frequency filter of 70Hz  and a low frequency filter of 1Hz . EEG data were recorded continuously and digitally stored.  Description: EEG showed continuous generalized 3 to 6 Hz theta-delta slowing admixed with 15 to 18 Hz generalized beta activity.  Sharp transients were seen in left frontotemporal region.  Hyperventilation and photic stimulation were not performed.    ABNORMALITY -Continuous slow, generalized  IMPRESSION: This study is suggestive of moderate to severe diffuse encephalopathy, nonspecific etiology.  No seizures or definite epileptiform discharges were seen throughout the recording.  [name]   ECHOCARDIOGRAM COMPLETE  Result Date: 11/27/2020    ECHOCARDIOGRAM REPORT   Patient Name:   RYLEAH MIRAMONTES Date of Exam: 11/27/2020 Medical Rec #:  [id number]        Height:       61.0 in Accession #:    [id number]       Weight:       87.7 lb Date of Birth:  Jun 15, 1941        BSA:          1.331 m Patient Age:    70 years         BP:           157/88 mmHg Patient Gender: F                HR:           69 bpm. Exam Location:  Inpatient Procedure: 2D Echo, 3D Echo, Cardiac Doppler and Color Doppler Indications:    Cardiac arrest  History:        Patient has prior history of Echocardiogram examinations, most                 recent 02/20/2015. Arrythmias:Cardiac Arrest, Signs/Symptoms:Chest                 Pain; Risk Factors:Hypertension. Hypoxemia.  Sonographer:    Roseanna Rainbow RDCS Referring Phys: [id number] [facility]  Sonographer Comments: Technically difficult study due to poor echo windows and echo performed with patient supine and on artificial respirator. Low windows, no true parasternal. IMPRESSIONS  1. Left ventricular ejection fraction, by estimation, is 60 to 65%. The left ventricle has normal function. The left ventricle has no  regional wall motion abnormalities. There is mild left ventricular hypertrophy. Left ventricular diastolic parameters are consistent with Grade II diastolic dysfunction (pseudonormalization).  2. Right ventricular systolic function is normal. The right ventricular size is normal. There is normal pulmonary artery systolic pressure. The estimated right ventricular systolic pressure is 93.2 mmHg.  3. The mitral valve is normal in structure. No evidence of mitral valve regurgitation. No evidence of mitral stenosis.  4. The aortic valve is tricuspid. Aortic valve regurgitation is not visualized. Mild aortic valve sclerosis is present, with no evidence of aortic valve stenosis.  5. The inferior vena cava is dilated in size with <50% respiratory variability, suggesting right atrial pressure of 15 mmHg.  6. Small pericardial effusion along the RV. FINDINGS  Left Ventricle: Left ventricular ejection fraction, by estimation, is 60 to  65%. The left ventricle has normal function. The left ventricle has no regional wall motion abnormalities. The left ventricular internal cavity size was normal in size. There is  mild left ventricular hypertrophy. Left ventricular diastolic parameters are consistent with Grade II diastolic dysfunction (pseudonormalization). Right Ventricle: The right ventricular size is normal. No increase in right ventricular wall thickness. Right ventricular systolic function is normal. There is normal pulmonary artery systolic pressure. The tricuspid regurgitant velocity is 2.20 m/s, and  with an assumed right atrial pressure of 15 mmHg, the estimated right ventricular systolic pressure is [id number] mmHg. Left Atrium: Left atrial size was normal in size. Right Atrium: Right atrial size was normal in size. Pericardium: Small pericardial effusion along the RV. Mitral Valve: The mitral valve is normal in structure. No evidence of mitral valve regurgitation. No evidence of mitral valve stenosis. Tricuspid Valve: The  tricuspid valve is normal in structure. Tricuspid valve regurgitation is trivial. Aortic Valve: The aortic valve is tricuspid. Aortic valve regurgitation is not visualized. Mild aortic valve sclerosis is present, with no evidence of aortic valve stenosis. Pulmonic Valve: The pulmonic valve was normal in structure. Pulmonic valve regurgitation is not visualized. Aorta: The aortic root is normal in size and structure. Venous: The inferior vena cava is dilated in size with less than 50% respiratory variability, suggesting right atrial pressure of 15 mmHg. IAS/Shunts: No atrial level shunt detected by color flow Doppler.  LEFT VENTRICLE PLAX 2D LVIDd:         3.15 cm     Diastology LVIDs:         2.40 cm     LV e' medial:    5.70 cm/s LV PW:         1.20 cm     LV E/e' medial:  15.3 LV IVS:        1.10 cm     LV e' lateral:   5.63 cm/s LVOT diam:     1.90 cm     LV E/e' lateral: 15.5 LV SV:         52 LV SV Index:   39 LVOT Area:     2.84 cm  LV Volumes (MOD) LV vol d, MOD A2C: 42.6 ml LV vol d, MOD A4C: 17.8 ml LV vol s, MOD A2C: 12.7 ml LV vol s, MOD A4C: 5.2 ml LV SV MOD A2C:     29.9 ml LV SV MOD A4C:     17.8 ml LV SV MOD BP:      19.5 ml RIGHT VENTRICLE             IVC RV S prime:     21.30 cm/s  IVC diam: 2.50 cm TAPSE (M-mode): 1.6 cm LEFT ATRIUM             Index      RIGHT ATRIUM          Index LA diam:        3.30 cm 2.48 cm/m RA Area:     5.82 cm LA Vol (A2C):   8.7 ml  6.52 ml/m RA Volume:   9.31 ml  7.00 ml/m LA Vol (A4C):   9.8 ml  7.36 ml/m LA Biplane Vol: 9.2 ml  6.92 ml/m  AORTIC VALVE LVOT Vmax:   115.00 cm/s LVOT Vmean:  77.500 cm/s LVOT VTI:    0.182 m  AORTA Ao Root diam: 3.40 cm Ao Asc diam:  2.80 cm MITRAL VALVE  TRICUSPID VALVE MV Area (PHT): 3.08 cm    TR Peak grad:   19.4 mmHg MV Decel Time: 246 msec    TR Vmax:        220.00 cm/s MV E velocity: 87.00 cm/s MV A velocity: 63.00 cm/s  SHUNTS MV E/A ratio:  1.38        Systemic VTI:  0.18 m                            Systemic  Diam: 1.90 cm Marca Ancona MD Electronically signed by Marca Ancona MD Signature Date/Time: 11/27/2020/12:52:26 PM    Final    CT HEAD CODE STROKE WO CONTRAST  Result Date: 11/25/2020 CLINICAL DATA:  Code stroke.  Right-sided weakness EXAM: CT HEAD WITHOUT CONTRAST TECHNIQUE: Contiguous axial images were obtained from the base of the skull through the vertex without intravenous contrast. COMPARISON:  Correlation made with MRI brain 2018 FINDINGS: Mild motion artifact is present. Brain: There is no acute intracranial hemorrhage, mass effect, or edema. Gray-white differentiation is preserved. 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 Additional patchy hypoattenuation in the supratentorial white matter is nonspecific but probably reflects mild chronic microvascular ischemic changes. Prominence of the ventricles and sulci reflects minor generalized parenchymal volume loss. No extra-axial collection. Vascular: No hyperdense vessel. There is mild intracranial atherosclerotic calcification at the skull base. Skull: Unremarkable. Sinuses/Orbits: Aerated.  No acute abnormality of the orbits. Other: Mastoid air cells are clear. ASPECTS ([place]ta Stroke Program Early CT Score) - Ganglionic level infarction (caudate, lentiform nuclei, internal capsule, insula, M1-M3 cortex): 7 - Supraganglionic infarction (M4-M6 cortex): 3 Total score (0-10 with 10 being normal): 10 IMPRESSION: There is no acute intracranial hemorrhage or evidence of acute infarction. ASPECT score is 10. These results were communicated to Dr. Derry Lory at 3:21 pm on 11/25/2020 by text page via the [facility] messaging system. Electronically Signed   By: Guadlupe Spanish M.D.   On: 11/25/2020 15:25   CT ANGIO HEAD CODE STROKE  Result Date: 11/25/2020 CLINICAL DATA:  Stroke/TIA.  Right-sided weakness and right gaze EXAM: CT ANGIOGRAPHY HEAD AND NECK TECHNIQUE: Multidetector CT imaging of the head and neck was performed using the  standard protocol during bolus administration of intravenous contrast. Multiplanar CT image reconstructions and MIPs were obtained to evaluate the vascular anatomy. Carotid stenosis measurements (when applicable) are obtained utilizing NASCET criteria, using the distal internal carotid diameter as the denominator. CONTRAST:  36mL OMNIPAQUE IOHEXOL 350 MG/ML SOLN COMPARISON:  MRA January 26, 2017. FINDINGS: CTA NECK FINDINGS Aortic arch: Great vessel origins are patent. Right carotid system: No evidence of dissection, stenosis (50% or greater) or occlusion. Tortuous ICA at the skull base. Left carotid system: No evidence of dissection, stenosis (50% or greater) or occlusion. Vertebral arteries: Codominant. No evidence of dissection, stenosis (50% or greater) or occlusion. Skeleton: ACDF spanning C4 through C7. Other neck: 1.3 cm nodule within the right thyroid, which does not require further imaging follow-up per current ACR guidelines. Otherwise, no wmass or suspicious adenopathy. Upper chest: Partially imaged consolidation in the dependent lungs bilaterally. Biapical pleuroparenchymal scarring. Diffuse bronchial wall thickening. Fluid in the stomach. Endotracheal tube terminates above the carina. Debris in the right mainstem bronchus and esophagus. Review of the MIP images confirms the above findings CTA HEAD FINDINGS Anterior circulation: No significant stenosis, proximal occlusion, aneurysm, or vascular malformation. Posterior circulation: No significant stenosis, proximal occlusion, aneurysm, or vascular malformation. Right  fetal type PCA. Venous sinuses: Poorly visualized given arterial timing. Anatomic variants: Right fetal type PCA. Review of the MIP images confirms the above findings IMPRESSION: 1. No large vessel occlusion or proximal hemodynamically significant stenosis intracranially or in the neck. 2. Partially imaged consolidation in the dependent lungs bilaterally, likely aspiration given debris in the  right mainstem bronchus, distal bronchi, and the esophagus. Electronically Signed   By: Margaretha Sheffield MD   On: 11/25/2020 16:04   CT ANGIO NECK CODE STROKE  Result Date: 11/25/2020 CLINICAL DATA:  Stroke/TIA.  Right-sided weakness and right gaze EXAM: CT ANGIOGRAPHY HEAD AND NECK TECHNIQUE: Multidetector CT imaging of the head and neck was performed using the standard protocol during bolus administration of intravenous contrast. Multiplanar CT image reconstructions and MIPs were obtained to evaluate the vascular anatomy. Carotid stenosis measurements (when applicable) are obtained utilizing NASCET criteria, using the distal internal carotid diameter as the denominator. CONTRAST:  6mL OMNIPAQUE IOHEXOL 350 MG/ML SOLN COMPARISON:  MRA January 26, 2017. FINDINGS: CTA NECK FINDINGS Aortic arch: Great vessel origins are patent. Right carotid system: No evidence of dissection, stenosis (50% or greater) or occlusion. Tortuous ICA at the skull base. Left carotid system: No evidence of dissection, stenosis (50% or greater) or occlusion. Vertebral arteries: Codominant. No evidence of dissection, stenosis (50% or greater) or occlusion. Skeleton: ACDF spanning C4 through C7. Other neck: 1.3 cm nodule within the right thyroid, which does not require further imaging follow-up per current ACR guidelines. Otherwise, no wmass or suspicious adenopathy. Upper chest: Partially imaged consolidation in the dependent lungs bilaterally. Biapical pleuroparenchymal scarring. Diffuse bronchial wall thickening. Fluid in the stomach. Endotracheal tube terminates above the carina. Debris in the right mainstem bronchus and esophagus. Review of the MIP images confirms the above findings CTA HEAD FINDINGS Anterior circulation: No significant stenosis, proximal occlusion, aneurysm, or vascular malformation. Posterior circulation: No significant stenosis, proximal occlusion, aneurysm, or vascular malformation. Right fetal type PCA. Venous  sinuses: Poorly visualized given arterial timing. Anatomic variants: Right fetal type PCA. Review of the MIP images confirms the above findings IMPRESSION: 1. No large vessel occlusion or proximal hemodynamically significant stenosis intracranially or in the neck. 2. Partially imaged consolidation in the dependent lungs bilaterally, likely aspiration given debris in the right mainstem bronchus, distal bronchi, and the esophagus. Electronically Signed   By: Margaretha Sheffield MD   On: 11/25/2020 16:04    (Echo, Carotid, EGD, Colonoscopy, ERCP)    Subjective: Patient seen and examined.  Husband at the bedside.  No overnight events.  Eating regular diet and denies any problems with swallowing.  Ready to get rehab.  Telemetry shows sinus rhythm with well-controlled heart rate.   Discharge Exam: Vitals:   12/10/20 0742 12/10/20 0900  BP: 120/75 (!) 147/70  Pulse: 67 68  Resp: 16   Temp: 97.9 F (36.6 C)   SpO2: 98%    Vitals:   12/09/20 1602 12/10/20 0026 12/10/20 0742 12/10/20 0900  BP: (!) 140/100 (!) 144/92 120/75 (!) 147/70  Pulse: 62 67 67 68  Resp: 11 15 16    Temp: 98.9 F (37.2 C) (!) 97.5 F (36.4 C) 97.9 F (36.6 C)   TempSrc: Oral Oral Oral   SpO2: 98% 98% 98%   Weight:      Height:        General: Pt is alert, awake, not in acute distress Frail and debilitated.  Not in any distress.  On room air. Cardiovascular: RRR, S1/S2 +, no rubs, no gallops  Respiratory: CTA bilaterally, no wheezing, no rhonchi Abdominal: Soft, NT, ND, bowel sounds + Extremities: no edema, no cyanosis    The results of significant diagnostics from this hospitalization (including imaging, microbiology, ancillary and laboratory) are listed below for reference.     Microbiology: Recent Results (from the past 240 hour(s))  Culture, blood (routine x 2)     Status: None   Collection Time: 11/30/20  9:18 PM   Specimen: BLOOD  Result Value Ref Range Status   Specimen Description BLOOD RIGHT  ANTECUBITAL  Final   Special Requests   Final    BOTTLES DRAWN AEROBIC AND ANAEROBIC Blood Culture adequate volume   Culture   Final    NO GROWTH 5 DAYS Performed at [facility], [address] [place], [place] [address]    Report Status 12/05/2020 FINAL  Final  Culture, blood (routine x 2)     Status: None   Collection Time: 12/01/20 12:25 AM   Specimen: BLOOD  Result Value Ref Range Status   Specimen Description BLOOD RIGHT ANTECUBITAL  Final   Special Requests   Final    BOTTLES DRAWN AEROBIC ONLY Blood Culture adequate volume   Culture   Final    NO GROWTH 5 DAYS Performed at [facility], [place] [address], [place], [place] [address]    Report Status 12/06/2020 FINAL  Final     Labs: BNP (last 3 results) No results for input(s): BNP in the last 8760 hours. Basic Metabolic Panel: Recent Labs  Lab 12/04/20 0417 12/05/20 0201 12/06/20 0339  NA 135 134* 132*  K 4.5 4.4 4.3  CL 95* 95* 95*  CO2 29 31 27   GLUCOSE 130* 149* 120*  BUN 25* 25* 16  CREATININE 0.53 0.53 0.49  CALCIUM 9.2 8.8* 8.5*  MG  --   --  1.7  PHOS  --   --  3.7   Liver Function Tests: Recent Labs  Lab 12/06/20 0339  AST 69*  ALT 103*  ALKPHOS 107  BILITOT 1.0  PROT 5.6*  ALBUMIN 2.0*   No results for input(s): LIPASE, AMYLASE in the last 168 hours. No results for input(s): AMMONIA in the last 168 hours. CBC: Recent Labs  Lab 12/04/20 0417 12/05/20 0201 12/06/20 0339 12/08/20 0815  WBC 14.6* 15.2* 16.8* 13.8*  NEUTROABS  --   --  13.3* 11.1*  HGB 11.1* 8.6* 8.6* 8.7*  HCT 33.1* 26.8* 25.7* 26.8*  MCV 91.7 93.7 92.1 92.7  PLT 341 322 390 622*   Cardiac Enzymes: No results for input(s): CKTOTAL, CKMB, CKMBINDEX, TROPONINI in the last 168 hours. BNP: Invalid input(s): POCBNP CBG: Recent Labs  Lab 12/09/20 1559 12/09/20 2017 12/10/20 0023 12/10/20 0337 12/10/20 0747  GLUCAP 95 125* 101* 100* 81   D-Dimer No results for input(s): DDIMER in the last 72  hours. Hgb A1c No results for input(s): HGBA1C in the last 72 hours. Lipid Profile No results for input(s): CHOL, HDL, LDLCALC, TRIG, CHOLHDL, LDLDIRECT in the last 72 hours. Thyroid function studies No results for input(s): TSH, T4TOTAL, T3FREE, THYROIDAB in the last 72 hours.  Invalid input(s): FREET3 Anemia work up No results for input(s): VITAMINB12, FOLATE, FERRITIN, TIBC, IRON, RETICCTPCT in the last 72 hours. Urinalysis    Component Value Date/Time   COLORURINE YELLOW 11/25/2020 2010   APPEARANCEUR CLEAR 11/25/2020 2010   LABSPEC 1.034 (H) 11/25/2020 2010   PHURINE 7.0 11/25/2020 2010   GLUCOSEU 50 (A) 11/25/2020 2010   GLUCOSEU NEGATIVE  12/26/2007 [place] 11/25/2020 2010   [place] 11/25/2020 2010   BILIRUBINUR neg 08/20/2019 [place] 11/25/2020 2010   PROTEINUR NEGATIVE 11/25/2020 2010   UROBILINOGEN 0.2 08/20/2019 1341   UROBILINOGEN 0.2 09/23/2012 1028   NITRITE NEGATIVE 11/25/2020 2010   LEUKOCYTESUR NEGATIVE 11/25/2020 2010   Sepsis Labs Invalid input(s): PROCALCITONIN,  WBC,  LACTICIDVEN Microbiology Recent Results (from the past 240 hour(s))  Culture, blood (routine x 2)     Status: None   Collection Time: 11/30/20  9:18 PM   Specimen: BLOOD  Result Value Ref Range Status   Specimen Description BLOOD RIGHT ANTECUBITAL  Final   Special Requests   Final    BOTTLES DRAWN AEROBIC AND ANAEROBIC Blood Culture adequate volume   Culture   Final    NO GROWTH 5 DAYS Performed at [facility], [address] [place], [place] [address]    Report Status 12/05/2020 FINAL  Final  Culture, blood (routine x 2)     Status: None   Collection Time: 12/01/20 12:25 AM   Specimen: BLOOD  Result Value Ref Range Status   Specimen Description BLOOD RIGHT ANTECUBITAL  Final   Special Requests   Final    BOTTLES DRAWN AEROBIC ONLY Blood Culture adequate volume   Culture   Final    NO GROWTH 5 DAYS Performed at [facility], [place] [address], [place], [place] [address]    Report Status 12/06/2020 FINAL  Final     Time coordinating discharge: 32 minutes  SIGNED:   [name] Merino, MD  [facility] 12/10/2020, 10:13 AM

## 2020-12-10 NOTE — H&P (Shared)
Physical Medicine and Rehabilitation Admission H&P    CC: Debility   HPI: Kristine Francis is a 80 year old female with history of HTN, asthma, hepatic steatosis, vestibulitis, CVA who was admitted on 11/26/19 with right sided weakness, right gaze preference and difficulty speaking. In ED noted to have difficulty handling oral secretions and had tonic clonic seizure loss of pulse requiring 2 minutes of chest compression and intubation for airway protection.   She received IV ativan and Keppra. CT head/CTA negative for LVO or acute changes. MRI brain done revealing multiple foci of susceptibility in bilateral cerebral hemispheres more pronounced in left occipital region question chronic microhemorrhages related to HTN or amyloid angiopathy. She was started on antibiotics for aspiration PNA and acyclovir due to concerns of meningitis and monitored on LT-EEG. EEG without seizures and showed evidence of moderate to severe encephalopathy.  She developed A fib in ICU and Eliquis added due to CHADS-VASc-6. Work up negative for infection and she was extubated to 4 L oxygen by 01/08. Bouts of agitation due to delirium have resolved. She was started on dysphagia 2, honey liquids but intake variable. Therapy ongoing and patient limited by weakness, ataxia, vertigo with gaze instability and cognitive deficits. CIR recommended due to functional decline in mobility and ADLs.    Review of Systems  Constitutional: Negative for chills and fever.  HENT: Negative for hearing loss.   Eyes: Negative for blurred vision, double vision and pain.  Respiratory: Negative for cough and shortness of breath.   Cardiovascular: Negative for chest pain and palpitations.  Gastrointestinal: Positive for heartburn. Negative for abdominal pain and constipation.  Genitourinary: Negative for frequency.  Musculoskeletal: Positive for joint pain. Negative for back pain and myalgias.  Skin: Negative for rash.  Neurological:  Positive for dizziness (occasionally). Negative for headaches.     Past Medical History:  Diagnosis Date  . Allergy   . Asthma    sees Dr. Eileen StanfordMeg Francis - as child per pt  . At high risk for falls    unstable gait   . Ataxia   . Cataract    removed bilaterally   . Diverticulosis   . Emphysema   . Gait abnormality 05/14/2013   Ms.Kristine QuintLayton a patient of Dr. Debby BudNorins presented first interpreter thousand 13 with a gait dysfunction, she also had an episodic confusion and Loss device. Exam found a mild nystagmus and she was referred to ophthalmology on [id number], brain MRI was normal nystagmus was a secondary diagnosis to vestibulitis, ear nose and throat has followed and had seen the patient. At the time of the nystagmus the patie  . GERD (gastroesophageal reflux disease)   . Hepatic steatosis   . Hyperlipidemia    on atorvastatin   . Hypertension    on losartan   . Iron deficiency anemia, unspecified   . Kidney cysts 11/16/12   small cyst on left  . Nystagmus, end-position   . Osteopenia   . Renal cyst   . Stroke (HCC) 01/2017   mild     Past Surgical History:  Procedure Laterality Date  . ANAL RECTAL MANOMETRY N/A 12/20/2017   Procedure: ANO RECTAL MANOMETRY;  Surgeon: Kristine FormNandigam, Kristine V, MD;  Location: [facility];  Service: Endoscopy;  Laterality: N/A;  . BREAST BIOPSY Bilateral 1970's  . CATARACT EXTRACTION Bilateral LT:02/14/13,RT:02/21/13  . CERVICAL SPINE SURGERY  2010  . COLONOSCOPY  03-12-14   per Dr. Juanda ChanceBrodie, diverticulosis only, repeat 5 yrs   . EYE SURGERY Bilateral  01/2013&02/2013   left then right  . HERNIA REPAIR  Oct '14-left, remote-right   years ago right; left done '14  . SKIN CANCER EXCISION  05/13/13   FACE, basal cell  . TONSILLECTOMY      Family History  Problem Relation Age of Onset  . Colon cancer Father   . Coronary artery disease Father   . Heart block Father   . Pancreatic cancer Mother   . Throat cancer Mother   . Colon cancer Paternal Grandmother   .  Colon cancer Paternal Aunt        x 2  . Cancer - Lung Paternal Aunt   . Breast cancer Paternal Aunt   . Colon cancer Paternal Aunt   . Ovarian cancer Maternal Aunt   . Heart attack Paternal Grandfather   . Esophageal cancer Other   . Colon polyps Neg Hx   . Rectal cancer Neg Hx   . Stomach cancer Neg Hx     Social History:  Married. Independent PTA--uses walker occasionally. 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 Helps babysit grandchildren twice a week. She reports that she has never smoked. She has never used smokeless tobacco. She reports that she does not drink alcohol and does not use drugs.    Allergies  Allergen Reactions  . Contrast Media [Iodinated Diagnostic Agents] Other (See Comments)    Congestion .Marland KitchenMarland Kitchenpatient stated it irritated her eyes and voice  . Iohexol Other (See Comments)    Gi upset  . Lisinopril Cough    Medications Prior to Admission  Medication Sig Dispense Refill  . EPINEPHrine 0.3 mg/0.3 mL IJ SOAJ injection Inject 0.3 mg into the muscle as needed for anaphylaxis.    Marland Kitchen ibuprofen (ADVIL) 800 MG tablet Take 800 mg by mouth 3 (three) times daily as needed for headache or moderate pain.    . nitrofurantoin, macrocrystal-monohydrate, (MACROBID) 100 MG capsule Take 1 capsule (100 mg total) by mouth 2 (two) times daily. 14 capsule 0  . ketoconazole (NIZORAL) 2 % cream Apply 1 application topically 2 (two) times daily as needed for irritation. (Patient not taking: No sig reported) 30 g 2    Drug Regimen Review { DRUG REGIMEN [id number]}  Home: Home Living Family/patient expects to be discharged to:: Private residence Living Arrangements: Spouse/significant other Available Help at Discharge: Family,Available 24 hours/day Type of Home: House Home Access: Stairs to enter [employer] of Steps: 1 Entrance Stairs-Rails: None Home Layout: One level Bathroom Shower/Tub: Chiropodist: Standard Bathroom  Accessibility: Yes Home Equipment: Walker - 4 wheels,Grab bars - tub/shower   Functional History: Prior Function Level of Independence: Independent with assistive device(s) Comments: Intermittent use of AD in home vs. furniture walking. Patient was Mod I with ADLs/IADLs including housekeeping and meal prep.  Functional Status:  Mobility: Bed Mobility Overal bed mobility: Needs Assistance Bed Mobility: Supine to Sit Rolling: Min assist Supine to sit: HOB elevated,Min guard Sit to supine: Max assist General bed mobility comments: Extra time and cues to manage legs off EOB and pull on bed rail to ascend trunk, min guard assist with HOB elevated. Transfers Overall transfer level: Needs assistance Equipment used: Rolling walker (2 wheeled) Transfers: Sit to/from Stand Sit to Stand: Min assist Stand pivot transfers: Min assist General transfer comment: Assist to power to standing with cues for hand placement as pt pulling up on RW initially, good carryover with subsequent reps. Stood from [employer], from chair x2. Ambulation/Gait Ambulation/Gait assistance:  Min assist Gait Distance (Feet): 20 Feet (x3 bouts with seated rest break between each bout) Assistive device: Rolling walker (2 wheeled) Gait Pattern/deviations: Trunk flexed,Shuffle,Ataxic,Step-through pattern,Decreased stride length General Gait Details: Slow, ataxic like gait with short shuffling steps, cuing pt to take "marching" steps with tactile cues at hamstrings, success but poor carryover. Cued pt also to improve upright posture as she tends to flex at her trunk, momentary success. Unable to perform both cues simultaneously. Good carryover with managing RW then taking steps sequencing turns. Gait velocity: reduced Gait velocity interpretation: <1.31 ft/sec, indicative of household ambulator    ADL: ADL Overall ADL's : Needs assistance/impaired Grooming: Wash/dry hands,Standing,Minimal assistance Grooming Details (indicate  cue type and reason): MIN A for balance while standing at sink to wash hands Lower Body Bathing: Supervison/ safety,Set up,Min guard,Sitting/lateral leans Lower Body Bathing Details (indicate cue type and reason): simulated via anterior pericare from [facility] Upper Body Dressing : Moderate assistance,Sitting Lower Body Dressing: Maximal assistance,Sit to/from stand Lower Body Dressing Details (indicate cue type and reason): to don <>doff panties during toileting Toilet Transfer: Minimal assistance,RW,Stand-pivot Toilet Transfer Details (indicate cue type and reason): MIN A for stand pivot transfer from EOB<>BSC with Rw Toileting- Clothing Manipulation and Hygiene: Supervision/safety,Set up,Min guard,Sitting/lateral lean Toileting - Clothing Manipulation Details (indicate cue type and reason): anterior pericare via lateral leans on BSC Functional mobility during ADLs: Minimal assistance,Cueing for safety,Rolling walker General ADL Comments: pt continues to present with impaired balance, generalized weakness and cognitive deficits  Cognition: Cognition Overall Cognitive Status: Impaired/Different from baseline Orientation Level: Oriented to time,Oriented to place,Oriented to person,Disoriented to situation Cognition Arousal/Alertness: Awake/alert Behavior During Therapy: WFL for tasks assessed/performed Overall Cognitive Status: Impaired/Different from baseline Area of Impairment: Attention,Memory,Following commands,Safety/judgement,Awareness,Problem solving Current Attention Level: Selective Memory: Decreased recall of precautions,Decreased short-term memory Following Commands: Follows one step commands consistently,Follows multi-step commands inconsistently Safety/Judgement: Decreased awareness of safety,Decreased awareness of deficits Awareness: Emergent Problem Solving: Slow processing,Requires verbal cues General Comments: Repeated cues provided throughout session. Difficulty following 2  cues at same time.   Blood pressure (!) 147/70, pulse 68, temperature 97.9 F (36.6 C), temperature source Oral, resp. rate 16, height 5\' 1"  (1.549 m), weight 47.6 kg, SpO2 98 %. Physical Exam Vitals and nursing note reviewed.  Constitutional:      Appearance: She is cachectic.     Comments: Frail appearing with flexed posture. Sunken cheeks with occasional mouth breathing.   Neck:     Comments: Decreased flexion (prior neck surgery) Pulmonary:     Breath sounds: No stridor.  Skin:    General: Skin is warm.  Neurological:     Mental Status: She is alert and oriented to person, place, and time.     Comments: Dysphonia noted (close to baseline per husband). Able to answer orientation questions and follow simple commands. Right facial weakness with decrease in fine motor movements RUE.  Lateral nystagmus worse on the left. Has decreased insight into deficits.      Results for orders placed or performed during the hospital encounter of 11/25/20 (from the past 48 hour(s))  Glucose, capillary     Status: Abnormal   Collection Time: 12/08/20 10:16 PM  Result Value Ref Range   Glucose-Capillary 145 (H) 70 - 99 mg/dL    Comment: Glucose reference range applies only to samples taken after fasting for at least 8 hours.  Glucose, capillary     Status: Abnormal   Collection Time: 12/09/20  1:20 AM  Result Value Ref Range  Glucose-Capillary 123 (H) 70 - 99 mg/dL    Comment: Glucose reference range applies only to samples taken after fasting for at least 8 hours.  Glucose, capillary     Status: Abnormal   Collection Time: 12/09/20  3:37 AM  Result Value Ref Range   Glucose-Capillary 107 (H) 70 - 99 mg/dL    Comment: Glucose reference range applies only to samples taken after fasting for at least 8 hours.  Glucose, capillary     Status: Abnormal   Collection Time: 12/09/20  7:46 AM  Result Value Ref Range   Glucose-Capillary 102 (H) 70 - 99 mg/dL    Comment: Glucose reference range applies  only to samples taken after fasting for at least 8 hours.  Glucose, capillary     Status: None   Collection Time: 12/09/20 11:58 AM  Result Value Ref Range   Glucose-Capillary 94 70 - 99 mg/dL    Comment: Glucose reference range applies only to samples taken after fasting for at least 8 hours.  Glucose, capillary     Status: None   Collection Time: 12/09/20  3:59 PM  Result Value Ref Range   Glucose-Capillary 95 70 - 99 mg/dL    Comment: Glucose reference range applies only to samples taken after fasting for at least 8 hours.  Glucose, capillary     Status: Abnormal   Collection Time: 12/09/20  8:17 PM  Result Value Ref Range   Glucose-Capillary 125 (H) 70 - 99 mg/dL    Comment: Glucose reference range applies only to samples taken after fasting for at least 8 hours.   Comment 1 Notify RN    Comment 2 Document in Chart   Glucose, capillary     Status: Abnormal   Collection Time: 12/10/20 12:23 AM  Result Value Ref Range   Glucose-Capillary 101 (H) 70 - 99 mg/dL    Comment: Glucose reference range applies only to samples taken after fasting for at least 8 hours.   Comment 1 Notify RN    Comment 2 Document in Chart   Glucose, capillary     Status: Abnormal   Collection Time: 12/10/20  3:37 AM  Result Value Ref Range   Glucose-Capillary 100 (H) 70 - 99 mg/dL    Comment: Glucose reference range applies only to samples taken after fasting for at least 8 hours.   Comment 1 Notify RN    Comment 2 Document in Chart   Glucose, capillary     Status: None   Collection Time: 12/10/20  7:47 AM  Result Value Ref Range   Glucose-Capillary 81 70 - 99 mg/dL    Comment: Glucose reference range applies only to samples taken after fasting for at least 8 hours.   No results found.     Medical Problem List and Plan: 1.  *** secondary to ***  -patient may *** shower  -ELOS/Goals: *** 2.  Antithrombotics: -DVT/anticoagulation:  Pharmaceutical: Other (comment)--on Eliquis  -antiplatelet  therapy: N/a 3. Pain Management: N/A 4. Mood: LCSW to follow for evaluation and support.   -antipsychotic agents: N/A 5. Neuropsych: This patient is not fully capable of making decisions on her own behalf. 6. Skin/Wound Care: Routine pressure relief measures.  7. Fluids/Electrolytes/Nutrition: Monitor I/O. Check lytes in am.  8. PAF: New onset--continue Metoprolol and eliquis BID. 9. Dysphagia: On D2, honey liquids. Monitor hydration with check of lytes in am and next week. 10.  Hyponateremia: Will recheck Na in am--question dilutional.  11. Acute on chronic Iron deficiency  anemia:H/H down from 11-->8.7. Check stool guaiacs. Monitor for signs of bleeding. Will add iron supplement. 12. Leucocytosis: Monitor for fevers and other signs of infection.  Recheck WBC in am.  13. Seizure prophylaxis: Stable on Keppra. Tolerating without SE.   Bary Leriche, PA-C 12/10/2020

## 2020-12-10 NOTE — Progress Notes (Signed)
Inpatient Rehabilitation Medication Review by a Pharmacist  A complete drug regimen review was completed for this patient to identify any potential clinically significant medication issues.  Clinically significant medication issues were identified:  no  Check AMION for pharmacist assigned to patient if future medication questions/issues arise during this admission.  Pharmacist comments: None  Time spent performing this drug regimen review (minutes):  10   Tad Moore 12/10/2020 2:37 PM

## 2020-12-11 ENCOUNTER — Inpatient Hospital Stay (HOSPITAL_COMMUNITY): Payer: Medicare Other | Admitting: Physical Therapy

## 2020-12-11 ENCOUNTER — Inpatient Hospital Stay (HOSPITAL_COMMUNITY): Payer: Medicare Other

## 2020-12-11 ENCOUNTER — Inpatient Hospital Stay (HOSPITAL_COMMUNITY): Payer: Medicare Other | Admitting: Speech Pathology

## 2020-12-11 DIAGNOSIS — L8990 Pressure ulcer of unspecified site, unspecified stage: Secondary | ICD-10-CM | POA: Insufficient documentation

## 2020-12-11 LAB — CBC WITH DIFFERENTIAL/PLATELET
Abs Immature Granulocytes: 0.21 10*3/uL — ABNORMAL HIGH (ref 0.00–0.07)
Basophils Absolute: 0.1 10*3/uL (ref 0.0–0.1)
Basophils Relative: 1 %
Eosinophils Absolute: 0.1 10*3/uL (ref 0.0–0.5)
Eosinophils Relative: 1 %
HCT: 25.4 % — ABNORMAL LOW (ref 36.0–46.0)
Hemoglobin: 8.2 g/dL — ABNORMAL LOW (ref 12.0–15.0)
Immature Granulocytes: 2 %
Lymphocytes Relative: 13 %
Lymphs Abs: 1.3 10*3/uL (ref 0.7–4.0)
MCH: 30.5 pg (ref 26.0–34.0)
MCHC: 32.3 g/dL (ref 30.0–36.0)
MCV: 94.4 fL (ref 80.0–100.0)
Monocytes Absolute: 1 10*3/uL (ref 0.1–1.0)
Monocytes Relative: 10 %
Neutro Abs: 7.2 10*3/uL (ref 1.7–7.7)
Neutrophils Relative %: 73 %
Platelets: 739 10*3/uL — ABNORMAL HIGH (ref 150–400)
RBC: 2.69 MIL/uL — ABNORMAL LOW (ref 3.87–5.11)
RDW: 14.8 % (ref 11.5–15.5)
WBC: 9.9 10*3/uL (ref 4.0–10.5)
nRBC: 0 % (ref 0.0–0.2)

## 2020-12-11 LAB — COMPREHENSIVE METABOLIC PANEL
ALT: 27 U/L (ref 0–44)
AST: 18 U/L (ref 15–41)
Albumin: 2.1 g/dL — ABNORMAL LOW (ref 3.5–5.0)
Alkaline Phosphatase: 71 U/L (ref 38–126)
Anion gap: 9 (ref 5–15)
BUN: 12 mg/dL (ref 8–23)
CO2: 26 mmol/L (ref 22–32)
Calcium: 8.2 mg/dL — ABNORMAL LOW (ref 8.9–10.3)
Chloride: 97 mmol/L — ABNORMAL LOW (ref 98–111)
Creatinine, Ser: 0.56 mg/dL (ref 0.44–1.00)
GFR, Estimated: >60 mL/min (ref >60)
Glucose, Bld: 99 mg/dL (ref 70–99)
Potassium: 4.1 mmol/L (ref 3.5–5.1)
Sodium: 132 mmol/L — ABNORMAL LOW (ref 135–145)
Total Bilirubin: 0.8 mg/dL (ref 0.3–1.2)
Total Protein: 5 g/dL — ABNORMAL LOW (ref 6.5–8.1)

## 2020-12-11 LAB — OCCULT BLOOD X 1 CARD TO LAB, STOOL: Fecal Occult Bld: POSITIVE — AB

## 2020-12-11 MED ORDER — ACETAMINOPHEN 325 MG PO TABS: 325.0000 mg | ORAL_TABLET | ORAL | Status: AC | PRN

## 2020-12-11 MED ORDER — LIP MEDEX EX OINT: TOPICAL_OINTMENT | CUTANEOUS | 0 refills | Status: DC | PRN

## 2020-12-11 NOTE — Evaluation (Signed)
Physical Therapy Assessment and Plan  Patient Details  Name: Kristine Francis MRN: [id number] Date of Birth: Aug 03, 1941  PT Diagnosis: Abnormal posture, Abnormality of gait, Difficulty walking, Hemiplegia dominant, Impaired cognition and Muscle weakness Rehab Potential: Good ELOS: 7-12 days   Today's Date: 12/11/2020 PT Individual Time: 1330-1430 PT Individual Time Calculation (min): 60 min    Hospital Problem: Principal Problem:   Encephalopathy Active Problems:   Pressure injury of skin   Past Medical History:  Past Medical History:  Diagnosis Date  . Allergy   . Asthma    sees Dr. Tiajuana Amass - as child per pt  . At high risk for falls    unstable gait   . Ataxia   . Cataract    removed bilaterally   . Diverticulosis   . Emphysema   . Gait abnormality 05/14/2013   Ms.Brackney a patient of Dr. Linda Hedges presented first interpreter thousand 13 with a gait dysfunction, she also had an episodic confusion and Loss device. Exam found a mild nystagmus and she was referred to ophthalmology on 03-2812, brain MRI was normal nystagmus was a secondary diagnosis to vestibulitis, ear nose and throat has followed and had seen the patient. At the time of the nystagmus the patie  . GERD (gastroesophageal reflux disease)   . Hepatic steatosis   . Hyperlipidemia    on atorvastatin   . Hypertension    on losartan   . Iron deficiency anemia, unspecified   . Kidney cysts 11/16/12   small cyst on left  . Nystagmus, end-position   . Osteopenia   . Renal cyst   . Stroke ([place]) 01/2017   mild    Past Surgical History:  Past Surgical History:  Procedure Laterality Date  . ANAL RECTAL MANOMETRY N/A 12/20/2017   Procedure: ANO RECTAL MANOMETRY;  Surgeon: Mauri Pole, MD;  Location: [facility];  Service: Endoscopy;  Laterality: N/A;  . BREAST BIOPSY Bilateral 1970's  . CATARACT EXTRACTION Bilateral LT:02/14/13,RT:02/21/13  . CERVICAL SPINE SURGERY  2010  . COLONOSCOPY  03-12-14   per Dr.  Olevia Perches, diverticulosis only, repeat 5 yrs   . EYE SURGERY Bilateral 01/2013&02/2013   left then right  . HERNIA REPAIR  Oct '14-left, remote-right   years ago right; left done '14  . SKIN CANCER EXCISION  05/13/13   FACE, basal cell  . TONSILLECTOMY      Assessment & Plan Clinical Impression: Patient is a 80 y.o. year old female with history of HTN, asthma, hepatic steatosis, vestibulitis, CVA who was admitted on 11/25/20 with right sided weakness, right gaze preference and difficulty speaking. In ED noted to have difficulty handling oral secretions and had tonic clonic seizure loss of pulse requiring 2 minutes of chest compression and intubation for airway protection.   She received IV ativan and Keppra. CT head/CTA negative for LVO or acute changes. MRI brain done revealing multiple foci of susceptibility in bilateral cerebral hemispheres more pronounced in left occipital region question chronic microhemorrhages related to HTN or amyloid angiopathy. She was started on antibiotics for aspiration PNA and acyclovir due to concerns of meningitis and monitored on LT-EEG. EEG without seizures and showed evidence of moderate to severe encephalopathy.  She developed A fib in ICU and Eliquis added due to CHADS-VASc-6. Work up negative for infection and she was extubated to 4 L oxygen by 01/08. Bouts of agitation due to delirium have resolved. She was started on dysphagia 2, honey liquids but intake variable. Therapy ongoing and patient  limited by weakness, ataxia, vertigo with gaze instability and cognitive deficits. CIR recommended due to functional decline in mobility and ADLs.    Per pt, and husband who was at bedside, LBM yesterday- voiding OK0 no dysuria.  Denies pain- ate <50% lunch, but said wasn't through with it.  According to her husband, only skin issue is a scratch on LLE- on shin.  .  Patient transferred to CIR on 12/10/2020 .   Patient currently requires min with mobility secondary to  muscle weakness, decreased cardiorespiratoy endurance, impaired timing and sequencing, unbalanced muscle activation, decreased coordination and decreased motor planning and decreased sitting balance, decreased standing balance, decreased postural control and decreased balance strategies.  Prior to hospitalization, patient was modified independent  with mobility and lived with Spouse in a House home.  Home access is 1Stairs to enter.  Patient will benefit from skilled PT intervention to maximize safe functional mobility, minimize fall risk and decrease caregiver burden for planned discharge home with 24 hour supervision.  Anticipate patient will benefit from follow up [place] at discharge.  PT - End of Session Activity Tolerance: Tolerates 30+ min activity with multiple rests Endurance Deficit: Yes Endurance Deficit Description: Req multiple seated rest breaks throughout session, states fatigue after functional transfers PT Assessment Rehab Potential (ACUTE/IP ONLY): Good PT Barriers to Discharge Comments: pt and spouse report she is not at baseline cognitively and currently requires more assistance for problem solving and multi-step mobility PT Patient demonstrates impairments in the following area(s): Balance;Endurance;Motor;Safety;Skin Integrity PT Transfers Functional Problem(s): Bed Mobility;Bed to Chair;Car;Furniture PT Locomotion Functional Problem(s): Ambulation;Stairs PT Plan PT Intensity: Minimum of 1-2 x/day ,45 to 90 minutes PT Frequency: 5 out of 7 days PT Duration Estimated Length of Stay: 7-12 days PT Treatment/Interventions: Ambulation/gait training;Discharge planning;Functional mobility training;Psychosocial support;Therapeutic Activities;Visual/perceptual remediation/compensation;Wheelchair propulsion/positioning;Therapeutic Exercise;Skin care/wound management;Neuromuscular re-education;Balance/vestibular training;Disease management/prevention;Cognitive remediation/compensation;UE/LE  Strength taining/ROM;Splinting/orthotics;Pain management;DME/adaptive equipment instruction;Community reintegration;Functional electrical stimulation;Patient/family education;Stair training;UE/LE Coordination activities PT Transfers Anticipated Outcome(s): supervision PT Locomotion Anticipated Outcome(s): CGA PT Recommendation Follow Up Recommendations: Home health PT Patient destination: Home Equipment Recommended: Rolling walker with 5" wheels   PT Evaluation Precautions/Restrictions Precautions Precautions: Fall;Other (comment) Precaution Comments: seizure Restrictions Weight Bearing Restrictions: No General   Vital SignsTherapy Vitals Temp: 98 F (36.7 C) Temp Source: Oral Pulse Rate: 66 Resp: 17 BP: (!) 152/75 Patient Position (if appropriate): Sitting Oxygen Therapy SpO2: 98 % O2 Device: Room Air Pain Pain Assessment Pain Scale: 0-10 Pain Score: 0-No pain Faces Pain Scale: No hurt Home Living/Prior Functioning Home Living Available Help at Discharge: Family;Available 24 hours/day Type of Home: House Home Access: Stairs to enter [employer] of Steps: 1 Entrance Stairs-Rails: None Home Layout: One level Bathroom Shower/Tub: Chiropodist: Standard Bathroom Accessibility: Yes  Lives With: Spouse Prior Function Level of Independence: Requires assistive device for independence;Independent with basic ADLs;Independent with homemaking with wheelchair;Independent with transfers;Independent with homemaking with ambulation  Able to Take Stairs?: Yes Driving: No Comments: Intermittent use of AD in home vs. furniture walking. Patient was Mod I with ADLs/IADLs including housekeeping and meal prep. Vision/Perception  Vision - Assessment Additional Comments: (+) nystagmus in B eyes at end rage R>L Perception Perception: Within Functional Limits Praxis Praxis: Intact  Cognition Overall Cognitive Status: Impaired/Different from  baseline Arousal/Alertness: Awake/alert Orientation Level: Oriented X4 Attention: Sustained Sustained Attention: Appears intact Memory: Impaired Memory Impairment: Decreased recall of new information;Decreased short term memory Decreased Short Term Memory: Verbal basic;Functional basic Awareness: Appears intact Awareness Impairment: Intellectual impairment Problem Solving: Impaired Problem  Solving Impairment: Functional complex Safety/Judgment: Appears intact Sensation Sensation Light Touch: Appears Intact Hot/Cold: Appears Intact Proprioception: Impaired by gross assessment Stereognosis: Appears Intact Coordination Gross Motor Movements are Fluid and Coordinated: No Fine Motor Movements are Fluid and Coordinated: Yes Motor  Motor Motor: Hemiplegia;Abnormal postural alignment and control Motor - Skilled Clinical Observations: mild R hemiparesis   Trunk/Postural Assessment  Cervical Assessment Cervical Assessment: Exceptions to [facility] (forward head carriage) Thoracic Assessment Thoracic Assessment:  (kyphotic) Lumbar Assessment Lumbar Assessment:  (posterior pelvic tilt)  Balance Balance Balance Assessed: Yes Static Sitting Balance Static Sitting - Balance Support: Feet supported Static Sitting - Level of Assistance: 5: Stand by assistance Dynamic Sitting Balance Dynamic Sitting - Balance Support: Feet supported Dynamic Sitting - Level of Assistance: 4: Min assist Sitting balance - Comments: reliant on UE support Static Standing Balance Static Standing - Balance Support: Bilateral upper extremity supported Static Standing - Level of Assistance: 4: Min assist Dynamic Standing Balance Dynamic Standing - Balance Support: Bilateral upper extremity supported Dynamic Standing - Level of Assistance: 3: Mod assist Dynamic Standing - Balance Activities: Lateral lean/weight shifting;Forward lean/weight shifting;Reaching for objects Extremity Assessment      RLE Assessment RLE  Assessment: Exceptions to [facility] RLE Strength Right Hip Flexion: 3+/5 Right Hip ABduction: 3+/5 Right Hip ADduction: 3+/5 Right Knee Flexion: 3+/5 Right Ankle Dorsiflexion: 3+/5 Right Ankle Plantar Flexion: 3+/5 LLE Assessment LLE Assessment: Exceptions to [facility] LLE Strength Left Hip Flexion: 4-/5 Left Hip ABduction: 4-/5 Left Hip ADduction: 4-/5 Left Knee Flexion: 4-/5 Left Ankle Dorsiflexion: 4-/5 Left Ankle Plantar Flexion: 4-/5  Care Tool Care Tool Bed Mobility Roll left and right activity   Roll left and right assist level: Supervision/Verbal cueing    Sit to lying activity   Sit to lying assist level: Minimal Assistance - Patient > 75%    Lying to sitting edge of bed activity   Lying to sitting edge of bed assist level: Minimal Assistance - Patient > 75%     Care Tool Transfers Sit to stand transfer   Sit to stand assist level: Minimal Assistance - Patient > 75%    Chair/bed transfer   Chair/bed transfer assist level: Minimal Assistance - Patient > 75%     Toilet transfer   Assist Level: Minimal Assistance - Patient > 75%    Car transfer   Car transfer assist level: Minimal Assistance - Patient > 75%      Care Tool Locomotion Ambulation   Assist level: Minimal Assistance - Patient > 75% Assistive device: Walker-rolling Max distance: 20  Walk 10 feet activity   Assist level: Minimal Assistance - Patient > 75%     Walk 50 feet with 2 turns activity Walk 50 feet with 2 turns activity did not occur: Safety/medical concerns      Walk 150 feet activity Walk 150 feet activity did not occur: Safety/medical concerns      Walk 10 feet on uneven surfaces activity Walk 10 feet on uneven surfaces activity did not occur: Safety/medical concerns      Stairs   Assist level: Moderate Assistance - Patient - 50 - 74% Stairs assistive device: 2 hand rails Max number of stairs: 1  Walk up/down 1 step activity Walk up/down 1 step or curb (drop down) activity did not  occur: Safety/medical concerns     Walk up/down 4 steps activity did not occuR: Safety/medical concerns  Walk up/down 4 steps activity      Walk up/down 12 steps activity Walk up/down 12  steps activity did not occur: Safety/medical concerns      Pick up small objects from floor   Pick up small object from the floor assist level: Minimal Assistance - Patient > 75% Pick up small object from the floor assistive device: small cup with support from RW  Wheelchair Will patient use wheelchair at discharge?: No Type of Wheelchair: Manual   Wheelchair assist level: Maximal Assistance - Patient 25 - 49% Max wheelchair distance: 75  Wheel 50 feet with 2 turns activity   Assist Level: Maximal Assistance - Patient 25 - 49%  Wheel 150 feet activity Wheelchair 150 feet activity did not occur: Safety/medical concerns      Refer to Care Plan for Long Term Goals  SHORT TERM GOAL WEEK 1 PT Short Term Goal 1 (Week 1): pt to demonstrate supine<>sit CGA consistently PT Short Term Goal 2 (Week 1): pt to demonstrate functional transfers with LRAD at [facility] PT Short Term Goal 3 (Week 1): pt to demonstrate ambulation with RW for 50' min A PT Short Term Goal 4 (Week 1): pt to demonstrate dynamic standing balance at min A consistently  Recommendations for other services: None   Skilled Therapeutic Intervention Evaluation completed (see details above and below) with education on PT POC and goals and individual treatment initiated with focus on  Bed mobility, transfer training, gait training, safety awareness, call light use, step training, standing and sitting balance.  pt received in bed and agreeable to therapy, husband present. Pt denied pain, dizziness, and reported just being tired. Pt directed in supine>sit min A from flat bed and technique of transferring into long sitting first then bringing legs to side of bed, pt benefited from [facility] for attempting without husband to assist as initially pt asked husband to  help her. Pt then able to complete at min A, pt demonstrated CGA -SBA for static sitting balance with BLE supported and at least one UE supported and CGA-min A for dynamic sitting balance with functional tasks and one UE-two UE supported. Pt directed in Sit to stand to Rolling walker with VC for hand placement and technique min A and gait training 10' with Rolling walker min A for stability and VC for problem solving to sit in room chair. Pt demonstrated intermittent confusion throughout session with need of single step commands and assist with problem solving and sequencing. Pt directed in Stand pivot transfer to WC at min with Rolling walker and taken to gym total A for time and energy conservation. Pt directed in ascending/descending one step with B handrails at mod A with VC for sequencing. Pt reported increased fatigue post this and required seated rest break. Pt then directed in Sit to stand to Rolling walker min A, directed in picking up small cup from floor with RW for external support at min A. Pt directed in card matching game without external support at [facility] and intermittently min A for stability with VC for trunk extension, improved head carriage and increased BOS, pt also demonstrated x3 needs of matching cards on Rt side and 2/3 times R and inferiorly. Pt directed in standing 4 mins x2 and completed x7 cards initially and x10 cards on second rep. Pt directed in gait training for 20' with Rolling walker at min A VC for head carriage, trunk extension, and walker proximity. Pt directed in WC mobility 75' max A at poor cadence and distracted throughout with decreased ability to problem solve how to mobilze WC. PT completed distance to room, total A  and pt requested to return to bed, min A. Pt left in bed, husband present. All needs in reach and in good condition. Call light in hand.  Bed alarm set.   Mobility Bed Mobility Bed Mobility: Rolling Right;Right Sidelying to Sit;Sit to Supine;Scooting to  [facility];Sitting - Scoot to [employer] of Bed;Rolling Left Rolling Right: Supervision/verbal cueing Rolling Left: Supervision/Verbal cueing Right Sidelying to Sit: Minimal Assistance - Patient > 75% Sitting - Scoot to Edge of Bed: Moderate Assistance - Patient 50-74% Sit to Supine: Minimal Assistance - Patient > 75% Scooting to HOB: Maximal Assistance - Patient 25-49% Transfers Transfers: Stand to Sit;Sit to Stand;Stand Pivot Transfers Sit to Stand: Minimal Assistance - Patient > 75% Stand to Sit: Minimal Assistance - Patient > 75% Stand Pivot Transfers: Minimal Assistance - Patient > 75% Transfer (Assistive device): Rolling walker Locomotion  Gait Ambulation: Yes Gait Assistance: Minimal Assistance - Patient > 75% Gait Distance (Feet): 20 Feet Assistive device: Rolling walker Gait Assistance Details: Verbal cues for gait pattern;Verbal cues for safe use of DME/AE;Verbal cues for sequencing;Verbal cues for technique;Verbal cues for precautions/safety Gait Gait: Yes Gait Pattern: Impaired Gait Pattern: Trunk flexed;Step-through pattern;Decreased step length - right;Lateral trunk lean to right;Poor foot clearance - right Gait velocity: reduced Stairs / Additional Locomotion Stairs: Yes Stairs Assistance: Moderate Assistance - Patient 50 - 74% Stair Management Technique: Two rails Number of Stairs: 1 Ramp: Moderate Assistance - Patient 50 - 74% Curb: Moderate Assistance - Patient 50 - 74% Wheelchair Mobility Wheelchair Mobility: Yes Wheelchair Assistance: Maximal Assistance - Patient 25 - 49% Wheelchair Propulsion: Both upper extremities Wheelchair Parts Management: Needs assistance;Supervision/cueing Distance: 8'   Discharge Criteria: Patient will be discharged from PT if patient refuses treatment 3 consecutive times without medical reason, if treatment goals not met, if there is a change in medical status, if patient makes no progress towards goals or if patient is discharged from  hospital.  The above assessment, treatment plan, treatment alternatives and goals were discussed and mutually agreed upon: by patient and by family  Junie Panning 12/11/2020, 3:24 PM

## 2020-12-11 NOTE — Discharge Instructions (Addendum)
Inpatient Rehab Discharge Instructions  Kristine Francis Discharge date and time:  12/24/20  Activities/Precautions/ Functional Status: Activity: no lifting, driving, or strenuous exercise till cleared by MD Diet: chopped foods. Nectar liquids. Small bites. Need to clear throat intermittently. Oral care after meals.  Wound Care: keep wound clean and dry   Functional status:  ___ No restrictions     ___ Walk up steps independently _X__ 24/7 supervision/assistance   ___ Walk up steps with assistance ___ Intermittent supervision/assistance  ___ Bathe/dress independently ___ Walk with walker     ___ Bathe/dress with assistance ___ Walk Independently    ___ Shower independently _X__ Walk with assistance    _X__ Shower with assistance ___ No alcohol     ___ Return to work/school ________  COMMUNITY REFERRALS UPON DISCHARGE:    Outpatient: PT     OT [facility]                Agency: [facility] Phone: [phone]              Appointment Date/Time: Facility to Determine at Discharge  Medical Equipment/Items Ordered: Wheelchair, [facility], ShowerChair                                                 Agency/Supplier: [employer]    Special Instructions: 1. Will need follow up evaluation for heme positive stool. Recheck CBC in a week. 2. Toilet patient every 4 hours while awake.    Per [facility] statutes, patients with seizures are not allowed to drive until  they have been seizure-free for six months. Use caution when using heavy equipment or power tools. Avoid working on ladders or at heights. Take showers instead of baths. Ensure the water temperature is not too high on the home water heater. Do not go swimming alone. When caring for infants or small children, sit down when holding, feeding, or changing them to minimize risk of injury to the child in the event you have a seizure. Also, Maintain good sleep hygiene. Avoid alcohol.     My questions have been  answered and I understand these instructions. I will adhere to these goals and the provided educational materials after my discharge from the hospital.  Patient/Caregiver Signature _______________________________ Date __________  Clinician Signature _______________________________________ Date __________  Please bring this form and your medication list with you to all your follow-up doctor's appointments. Information on my medicine - ELIQUIS (apixaban)  This medication education was reviewed with me or my healthcare representative as part of my discharge preparation.   Why was Eliquis prescribed for you? Eliquis was prescribed for you to reduce the risk of a blood clot forming that can cause a stroke if you have a medical condition called atrial fibrillation (a type of irregular heartbeat).  What do You need to know about Eliquis ? Take your Eliquis TWICE DAILY - one tablet in the morning and one tablet in the evening with or without food. If you have difficulty swallowing the tablet whole please discuss with your pharmacist how to take the medication safely.  Take Eliquis exactly as prescribed by your doctor and DO NOT stop taking Eliquis without talking to the doctor who prescribed the medication.  Stopping may increase your risk of developing a stroke.  Refill your prescription before you run out.  After  discharge, you should have regular check-up appointments with your healthcare provider that is prescribing your Eliquis.  In the future your dose may need to be changed if your kidney function or weight changes by a significant amount or as you get older.  What do you do if you miss a dose? If you miss a dose, take it as soon as you remember on the same day and resume taking twice daily.  Do not take more than one dose of ELIQUIS at the same time to make up a missed dose.  Important Safety Information A possible side effect of Eliquis is bleeding. You should call your healthcare  provider right away if you experience any of the following: ? Bleeding from an injury or your nose that does not stop. ? Unusual colored urine (red or dark brown) or unusual colored stools (red or black). ? Unusual bruising for unknown reasons. ? A serious fall or if you hit your head (even if there is no bleeding).  Some medicines may interact with Eliquis and might increase your risk of bleeding or clotting while on Eliquis. To help avoid this, consult your healthcare provider or pharmacist prior to using any new prescription or non-prescription medications, including herbals, vitamins, non-steroidal anti-inflammatory drugs (NSAIDs) and supplements.  This website has more information on Eliquis (apixaban): http://[email or web address].com/eliquis/home

## 2020-12-11 NOTE — Progress Notes (Signed)
[facility] PHYSICAL MEDICINE & REHABILITATION PROGRESS NOTE   Subjective/Complaints:  No issues overnight  Pt feels like it is hard to remember things compared to PTA ROS- neg CP, SOB, N/V/D Objective:   No results found. Recent Labs    12/08/20 0815 12/11/20 0520  WBC 13.8* 9.9  HGB 8.7* 8.2*  HCT 26.8* 25.4*  PLT 622* 739*   Recent Labs    12/11/20 0520  NA 132*  K 4.1  CL 97*  CO2 26  GLUCOSE 99  BUN 12  CREATININE 0.56  CALCIUM 8.2*    Intake/Output Summary (Last 24 hours) at 12/11/2020 0732 Last data filed at 12/10/2020 2035 Gross per 24 hour  Intake 600 ml  Output -  Net 600 ml     Pressure Injury 12/10/20 Sacrum Mid Stage 2 -  Partial thickness loss of dermis presenting as a shallow open injury with a red, pink wound bed without slough. Small circular area at tail bone, center (Active)  12/10/20 1505  Location: Sacrum  Location Orientation: Mid  Staging: Stage 2 -  Partial thickness loss of dermis presenting as a shallow open injury with a red, pink wound bed without slough.  Wound Description (Comments): Small circular area at tail bone, center  Present on Admission: Yes    Physical Exam: Vital Signs Blood pressure 119/61, pulse 66, temperature 98.1 F (36.7 C), resp. rate 16, height 5\' 1"  (1.549 m), weight 42.5 kg, SpO2 99 %.   General: No acute distress Mood and affect are appropriate Heart: Regular rate and rhythm no rubs murmurs or extra sounds Lungs: Clear to auscultation, breathing unlabored, no rales or wheezes Abdomen: Positive bowel sounds, soft nontender to palpation, nondistended Extremities: No clubbing, cyanosis, or edema Skin: No evidence of breakdown, no evidence of rash Neurologic: Cranial nerves II through XII intact, motor strength is 5/5 in bilateral deltoid, bicep, tricep, grip, hip flexor, knee extensors, ankle dorsiflexor and plantar flexor Sensory exam normal sensation to light touch and proprioception in bilateral upper  and lower extremities Cerebellar exam mild dysmetria BUE  Musculoskeletal: Full range of motion in all 4 extremities. No joint swelling  Assessment/Plan: 1. Functional deficits which require 3+ hours per day of interdisciplinary therapy in a comprehensive inpatient rehab setting.  Physiatrist is providing close team supervision and 24 hour management of active medical problems listed below.  Physiatrist and rehab team continue to assess barriers to discharge/monitor patient progress toward functional and medical goals  Care Tool:  Bathing              Bathing assist       Upper Body Dressing/Undressing Upper body dressing        Upper body assist      Lower Body Dressing/Undressing Lower body dressing            Lower body assist       Toileting Toileting    Toileting assist       Transfers Chair/bed transfer  Transfers assist           Locomotion Ambulation   Ambulation assist              Walk 10 feet activity   Assist           Walk 50 feet activity   Assist           Walk 150 feet activity   Assist           Walk 10 feet on uneven surface  activity   Assist           Wheelchair     Assist               Wheelchair 50 feet with 2 turns activity    Assist            Wheelchair 150 feet activity     Assist          Blood pressure 119/61, pulse 66, temperature 98.1 F (36.7 C), resp. rate 16, height 5\' 1"  (1.549 m), weight 42.5 kg, SpO2 99 %.  Medical Problem List and Plan: 1.  Debility and hypoxic encephalopathy  - moderate to severe secondary to tonic clonic seizure, amyloid angiopathy, pneumonia and new onset Afib with RVR             -patient may  shower             -ELOS/Goals: 12-14 days- min A to mod I 2.  Antithrombotics: -DVT/anticoagulation:  Pharmaceutical: Other (comment)--on Eliquis- also for Afib with RVR             -antiplatelet therapy: N/a 3. Pain  Management: N/A 4. Mood: LCSW to follow for evaluation and support.              -antipsychotic agents: N/A 5. Neuropsych: This patient is not fully capable of making decisions on her own behalf. 6. Skin/Wound Care: Routine pressure relief measures.  7. Fluids/Electrolytes/Nutrition: Monitor I/O. Check lytes in am.  8. PAF: New onset--continue Metoprolol and eliquis BID. Vitals:   12/10/20 1933 12/11/20 0504  BP: 133/71 119/61  Pulse: 79 66  Resp: 16 16  Temp: 97.6 F (36.4 C) 98.1 F (36.7 C)  SpO2:  99%  rate controlled  9. Dysphagia: On D2, honey liquids. Monitor hydration with check of lytes in am and next week.discussed normal progression to thin liquids 10.  Hyponateremia: Will recheck Na in am--question dilutional.  11. Acute on chronic Iron deficiency anemia:H/H down from 11-->8.7. Check stool guaiacs. Monitor for signs of bleeding. Will add iron supplement. 12. Leukocytosis:due to PNA  resolved13. Seizure prophylaxis: Stable on Keppra. Tolerating without SE.     LOS: 1 days A FACE TO FACE EVALUATION WAS PERFORMED  Charlett Blake 12/11/2020, 7:32 AM

## 2020-12-11 NOTE — Progress Notes (Signed)
Inpatient Rehabilitation  Patient information reviewed and entered into eRehab system by Marlow Berenguer M. Shanelle Clontz, M.A., CCC/SLP, PPS Coordinator.  Information including medical coding, functional ability and quality indicators will be reviewed and updated through discharge.    

## 2020-12-11 NOTE — Progress Notes (Signed)
[facility] Individual Statement of Services  Patient Name:  Kristine Francis  Date:  12/11/2020  Welcome to the [facility].  Our goal is to provide you with an individualized program based on your diagnosis and situation, designed to meet your specific needs.  With this comprehensive rehabilitation program, you will be expected to participate in at least 3 hours of rehabilitation therapies Monday-Friday, with modified therapy programming on the weekends.  Your rehabilitation program will include the following services:  Physical Therapy (PT), Occupational Therapy (OT), Speech Therapy (ST), 24 hour per day rehabilitation nursing, Therapeutic Recreaction (TR), Neuropsychology, Care Coordinator, Rehabilitation Medicine, Nutrition Services, Pharmacy Services and Other  Weekly team conferences will be held on Wednesday to discuss your progress.  Your Inpatient Rehabilitation Care Coordinator will talk with you frequently to get your input and to update you on team discussions.  Team conferences with you and your family in attendance may also be held.  Expected length of stay: 12-14 Days  Overall anticipated outcome: Min A  Depending on your progress and recovery, your program may change. Your Inpatient Rehabilitation Care Coordinator will coordinate services and will keep you informed of any changes. Your Inpatient Rehabilitation Care Coordinator's name and contact numbers are listed  below.  The following services may also be recommended but are not provided by the [facility]:    [name] will be made to provide these services after discharge if needed.  Arrangements include referral to agencies that provide these services.  Your insurance has been verified to be:  Medicare A & B Your primary doctor is:  [name], MD  Pertinent information will be  shared with your doctor and your insurance company.  Inpatient Rehabilitation Care Coordinator:  Erlene Quan, [place] or [phone]  Information discussed with and copy given to patient by: Dyanne Iha, 12/11/2020, 9:27 AM

## 2020-12-11 NOTE — Progress Notes (Signed)
Inpatient Rehabilitation Care Coordinator Assessment and Plan Patient Details  Name: Kristine Francis MRN: [id number] Date of Birth: [birth year]  Today's Date: 12/11/2020  Hospital Problems: Principal Problem:   Encephalopathy  Past Medical History:  Past Medical History:  Diagnosis Date  . Allergy   . Asthma    sees Dr. Tiajuana Amass - as child per pt  . At high risk for falls    unstable gait   . Ataxia   . Cataract    removed bilaterally   . Diverticulosis   . Emphysema   . Gait abnormality 05/14/2013   Ms.Lotter a patient of Dr. Linda Hedges presented first interpreter thousand 13 with a gait dysfunction, she also had an episodic confusion and Loss device. Exam found a mild nystagmus and she was referred to ophthalmology on [id number], brain MRI was normal nystagmus was a secondary diagnosis to vestibulitis, ear nose and throat has followed and had seen the patient. At the time of the nystagmus the patie  . GERD (gastroesophageal reflux disease)   . Hepatic steatosis   . Hyperlipidemia    on atorvastatin   . Hypertension    on losartan   . Iron deficiency anemia, unspecified   . Kidney cysts 11/16/12   small cyst on left  . Nystagmus, end-position   . Osteopenia   . Renal cyst   . Stroke ([place]) 01/2017   mild    Past Surgical History:  Past Surgical History:  Procedure Laterality Date  . ANAL RECTAL MANOMETRY N/A 12/20/2017   Procedure: ANO RECTAL MANOMETRY;  Surgeon: Mauri Pole, MD;  Location: [facility];  Service: Endoscopy;  Laterality: N/A;  . BREAST BIOPSY Bilateral 1970's  . CATARACT EXTRACTION Bilateral LT:02/14/13,RT:02/21/13  . CERVICAL SPINE SURGERY  2010  . COLONOSCOPY  03-12-14   per Dr. Olevia Perches, diverticulosis only, repeat 5 yrs   . EYE SURGERY Bilateral 01/2013&02/2013   left then right  . HERNIA REPAIR  Oct '14-left, remote-right   years ago right; left done '14  . SKIN CANCER EXCISION  05/13/13   FACE, basal cell  . TONSILLECTOMY     Social History:   reports that she has never smoked. She has never used smokeless tobacco. She reports that she does not drink alcohol and does not use drugs.  Family / Support Systems Patient Roles: Spouse Spouse/Significant Other: Kristine Francis Children: Kristine Francis Anticipated Caregiver: Kristine Francis and Kristine Francis Ability/Limitations of Caregiver: MOD A Caregiver Availability: 24/7  Social History Preferred language: English Religion: Baptist Read: Yes Write: Yes   Abuse/Neglect Abuse/Neglect Assessment Can Be Completed: Yes Physical Abuse: Denies Verbal Abuse: Denies Sexual Abuse: Denies Exploitation of patient/patient's resources: Denies Self-Neglect: Denies  Emotional Status Pt's affect, behavior and adjustment status: no Recent Psychosocial Issues: no Psychiatric History: no Substance Abuse History: no  Patient / Family Perceptions, Expectations & Goals Premorbid pt/family roles/activities: Independent and active in the community Pt/family expectations/goals: [place]: None Premorbid Home Care/DME Agencies: Other (Comment) ([employer], [employer] (tub/shower)) Transportation available at discharge: Family able to transport  Discharge Planning Living Arrangements: Spouse/significant other Support Systems: Spouse/significant other,Children Type of Residence: Private residence (1 level home, 1 step to enter) Insurance Resources: Chartered certified accountant Screen Referred: No Living Expenses: Own Money Management: Patient,Spouse Does the patient have any problems obtaining your medications?: No Care Coordinator Anticipated Follow Up Needs: HH/OP Expected length of stay: 12-14 Days  Clinical Impression SW met with patient, introduced self and explained role to patient. Called spouse by phone, left  VM. Patient reports discharging home with husband, son does not live in home with patient and spouse. 1 level home, 1 step to enter.   Dyanne Iha 12/11/2020, 12:46 PM

## 2020-12-11 NOTE — IPOC Note (Addendum)
Overall Plan of Care [facility]) Patient Details Name: Kristine Francis MRN: [id number] DOB: 1941/03/23  Admitting Diagnosis: Encephalopathy  Hospital Problems: Principal Problem:   Encephalopathy Active Problems:   Pressure injury of skin     Functional Problem List: Nursing Bladder,Bowel,Safety,Pain,Endurance,Skin Integrity,Medication Management  PT Balance,Endurance,Motor,Safety,Skin Integrity  OT Balance,Cognition,Endurance,Motor,Vision  SLP Cognition,Linguistic,Nutrition  TR         Basic ADL's: OT Grooming,Bathing,Dressing,Toileting,Eating     Advanced  ADL's: OT       Transfers: PT Bed Mobility,Bed to Chair,Car,Furniture  OT Tub/Shower,Toilet     Locomotion: PT Ambulation,Stairs     Additional Impairments: OT Fuctional Use of Upper Extremity  SLP Swallowing,Communication,Social Cognition expression Memory,Awareness,Problem Solving  TR      Anticipated Outcomes Item Anticipated Outcome  Self Feeding ind  Swallowing  Supervision   Basic self-care  mod I  Toileting  mod I   Bathroom Transfers supervision  Bowel/Bladder  MAnage bowel and bladder with mod I assist  Transfers  supervision  Locomotion  CGA  Communication  Mod I  Cognition  Supervision  Pain  Pain at or below level 4  Safety/Judgment  Maintain safety with cues/reminders   Therapy Plan: PT Intensity: Minimum of 1-2 x/day ,45 to 90 minutes PT Frequency: 5 out of 7 days PT Duration Estimated Length of Stay: 7-12 days OT Intensity: Minimum of 1-2 x/day, 45 to 90 minutes OT Frequency: 5 out of 7 days OT Duration/Estimated Length of Stay: 9-12 days SLP Intensity: Minumum of 1-2 x/day, 30 to 90 minutes SLP Frequency: 3 to 5 out of 7 days SLP Duration/Estimated Length of Stay: 1.5-2 weeks   Due to the current state of emergency, patients may not be receiving their 3-hours of Medicare-mandated therapy.   Team Interventions: Nursing Interventions Patient/Family Education,Bowel  Management,Pain Management,Skin Care/Wound Management,Medication Management,Disease Management/Prevention,Bladder Management,Cognitive Remediation/Compensation,Discharge Planning  PT interventions Ambulation/gait training,Discharge planning,Functional mobility training,Psychosocial support,Therapeutic Activities,Visual/perceptual remediation/compensation,Wheelchair propulsion/positioning,Therapeutic Exercise,Skin care/wound management,Neuromuscular re-education,Balance/vestibular training,Disease management/prevention,Cognitive remediation/compensation,UE/LE Strength taining/ROM,Splinting/orthotics,Pain Occupational hygienist stimulation,Patient/family education,Stair training,UE/LE Coordination activities  OT Interventions Cognitive remediation/compensation,Discharge planning,DME/adaptive equipment instruction,Functional mobility training,Pain management,Psychosocial support,Skin care/wound managment,Therapeutic Activities,UE/LE Strength taining/ROM,Visual/perceptual remediation/compensation,UE/LE Visual merchandiser propulsion/positioning,Self Care/advanced ADL retraining,Patient/family education,Neuromuscular re-education,Disease Veterinary surgeon  SLP Interventions Cognitive remediation/compensation,Cueing hierarchy,Dysphagia/aspiration precaution training,Functional tasks,Patient/family education,Speech/Language facilitation,Internal/external aids  TR Interventions    SW/CM Interventions Discharge Planning,Psychosocial Support,Patient/Family Education,Disease Management/Prevention   Barriers to Discharge MD  Medical stability  Nursing Decreased caregiver support    PT   pt and spouse report she is not at baseline cognitively and currently requires more assistance for problem solving and multi-step mobility  OT      SLP      SW        Team Discharge Planning: Destination: PT-Home ,OT- Home , SLP-Home Projected Follow-up: PT-Home health PT, OT-  Home health OT, SLP-24 hour supervision/assistance,Outpatient SLP Projected Equipment Needs: PT-Rolling walker with 5" wheels, OT- 3 in 1 bedside comode,Tub/shower seat, SLP-None recommended by SLP Equipment Details: PT- , OT-  Patient/family involved in discharge planning: PT- Patient,Family member/caregiver,  OT-Patient,Family member/caregiver, SLP-Patient,Family member/caregiver  MD ELOS: 12-14d Medical Rehab Prognosis:  Excellent Assessment:  80 year old female with history of HTN, asthma, hepatic steatosis, vestibulitis, CVA who was admitted on 11/25/20 with right sided weakness, right gaze preference and difficulty speaking. In ED noted to have difficulty handling oral secretions and had tonic clonic seizure loss of pulse requiring 2 minutes of chest compression and intubation for airway protection.   She received IV ativan and Keppra.  CT head/CTA negative for LVO or acute changes. MRI brain done revealing multiple foci of susceptibility in bilateral cerebral hemispheres more pronounced in left occipital region question chronic microhemorrhages related to HTN or amyloid angiopathy. She was started on antibiotics for aspiration PNA and acyclovir due to concerns of meningitis and monitored on LT-EEG. EEG without seizures and showed evidence of moderate to severe encephalopathy.  She developed A fib in ICU and Eliquis added due to CHADS-VASc-6. Work up negative for infection and she was extubated to 4 L oxygen by 01/08. Bouts of agitation due to delirium have resolved. She was started on dysphagia 2, honey liquids but intake variable. Therapy ongoing and patient limited by weakness, ataxia, vertigo with gaze instability and cognitive deficits. CIR recommended    Now requiring 24/7 Rehab RN,MD, as well as CIR level PT, OT and SLP.  Treatment team will focus on ADLs and mobility with  goals set at ModI/Sup See Team Conference Notes for weekly updates to the plan of care

## 2020-12-11 NOTE — Evaluation (Signed)
Occupational Therapy Assessment and Plan  Patient Details  Name: Kristine Francis MRN: [id number] Date of Birth: May 13, 1941  OT Diagnosis: abnormal posture, cognitive deficits, hemiplegia affecting dominant side and decreased activity tolerance Rehab Potential: Rehab Potential (ACUTE ONLY): Good ELOS: 9-12 days   Today's Date: 12/11/2020 OT Individual Time: 0802-0903 OT Individual Time Calculation (min): 61 min     Hospital Problem: Principal Problem:   Encephalopathy   Past Medical History:  Past Medical History:  Diagnosis Date  . Allergy   . Asthma    sees Dr. Tiajuana Amass - as child per pt  . At high risk for falls    unstable gait   . Ataxia   . Cataract    removed bilaterally   . Diverticulosis   . Emphysema   . Gait abnormality 05/14/2013   Ms.Frankland a patient of Dr. Linda Hedges presented first interpreter thousand 13 with a gait dysfunction, she also had an episodic confusion and Loss device. Exam found a mild nystagmus and she was referred to ophthalmology on [id number], brain MRI was normal nystagmus was a secondary diagnosis to vestibulitis, ear nose and throat has followed and had seen the patient. At the time of the nystagmus the patie  . GERD (gastroesophageal reflux disease)   . Hepatic steatosis   . Hyperlipidemia    on atorvastatin   . Hypertension    on losartan   . Iron deficiency anemia, unspecified   . Kidney cysts 11/16/12   small cyst on left  . Nystagmus, end-position   . Osteopenia   . Renal cyst   . Stroke ([place]) 01/2017   mild    Past Surgical History:  Past Surgical History:  Procedure Laterality Date  . ANAL RECTAL MANOMETRY N/A 12/20/2017   Procedure: ANO RECTAL MANOMETRY;  Surgeon: Mauri Pole, MD;  Location: [facility];  Service: Endoscopy;  Laterality: N/A;  . BREAST BIOPSY Bilateral 1970's  . CATARACT EXTRACTION Bilateral LT:02/14/13,RT:02/21/13  . CERVICAL SPINE SURGERY  2010  . COLONOSCOPY  03-12-14   per Dr. Olevia Perches, diverticulosis  only, repeat 5 yrs   . EYE SURGERY Bilateral 01/2013&02/2013   left then right  . HERNIA REPAIR  Oct '14-left, remote-right   years ago right; left done '14  . SKIN CANCER EXCISION  05/13/13   FACE, basal cell  . TONSILLECTOMY      Assessment & Plan Clinical Impression: Patient is a 80 y.o. year old female with history of HTN, asthma, hepatic steatosis, vestibulitis, CVA who was admitted on 11/26/19 with right sided weakness, right gaze preference and difficulty speaking. In ED noted to have difficulty handling oral secretions and had tonic clonic seizure loss of pulse requiring 2 minutes of chest compression and intubation for airway protection. She received IV ativan and Keppra. CT head/CTA negative for LVO or acute changes. MRI brain done revealing multiple foci of susceptibility in bilateral cerebral hemispheres more pronounced in left occipital region question chronic microhemorrhages related to HTN or amyloid angiopathy. She was started on antibiotics for aspiration PNA and acyclovir due to concerns of meningitis and monitored on LT-EEG. EEG without seizures and showed evidence of moderate to severe encephalopathy.   Patient transferred to CIR on 12/10/2020 .    Patient currently requires min to max A with basic self-care skills secondary to muscle weakness, decreased cardiorespiratoy endurance, impaired timing and sequencing, unbalanced muscle activation, decreased coordination and decreased motor planning and decreased awareness, decreased problem solving, decreased memory and delayed processing.  Prior to hospitalization,  patient could complete BADLs/IADLs  with modified independent .  Patient will benefit from skilled intervention to increase independence with basic self-care skills prior to discharge home with care partner.  Anticipate patient will require intermittent supervision and follow up home health.  OT - End of Session Activity Tolerance: Tolerates 10 - 20 min activity with  multiple rests Endurance Deficit: Yes Endurance Deficit Description: Req multiple seated rest breaks throughout session, states fatigue after functional transfers OT Assessment Rehab Potential (ACUTE ONLY): Good OT Patient demonstrates impairments in the following area(s): Balance;Cognition;Endurance;Motor;Vision OT Basic ADL's Functional Problem(s): Grooming;Bathing;Dressing;Toileting;Eating OT Transfers Functional Problem(s): Tub/Shower;Toilet OT Additional Impairment(s): Fuctional Use of Upper Extremity OT Plan OT Intensity: Minimum of 1-2 x/day, 45 to 90 minutes OT Frequency: 5 out of 7 days OT Duration/Estimated Length of Stay: 9-12 days OT Treatment/Interventions: Cognitive remediation/compensation;Discharge planning;DME/adaptive equipment instruction;Functional mobility training;Pain management;Psychosocial support;Skin care/wound managment;Therapeutic Activities;UE/LE Strength taining/ROM;Visual/perceptual remediation/compensation;UE/LE Coordination activities;Therapeutic Exercise;Wheelchair propulsion/positioning;Self Care/advanced ADL retraining;Patient/family education;Neuromuscular re-education;Disease mangement/prevention;Community reintegration;Balance/vestibular training OT Self Feeding Anticipated Outcome(s): ind OT Basic Self-Care Anticipated Outcome(s): mod I OT Toileting Anticipated Outcome(s): mod I OT Bathroom Transfers Anticipated Outcome(s): supervision OT Recommendation Patient destination: Home Follow Up Recommendations: Home health OT Equipment Recommended: 3 in 1 bedside comode;Tub/shower seat   OT Evaluation Precautions/Restrictions  Precautions Precautions: Fall;Other (comment) Precaution Comments: seizure Restrictions Weight Bearing Restrictions: No General Chart Reviewed: Yes Pain Pain Assessment Pain Scale: 0-10 Pain Score: 0-No pain Home Living/Prior Functioning Home Living Family/patient expects to be discharged to:: Private residence Living  Arrangements: Spouse/significant other Available Help at Discharge: Family,Available 24 hours/day (husband [name]) Type of Home: House Home Access: Stairs to enter [employer] of Steps: 1 Entrance Stairs-Rails: None Home Layout: One level Bathroom Shower/Tub: Optometrist: Yes  Lives With: Spouse IADL History Child Care Responsibility: Secondary Current License: Yes Occupation: Retired (retired Engineer, [employer]) Leisure and Hobbies: babysits grandchildren 2x/week, enjoys squirrel watching IADL Comments: Driving PTA Prior Function Level of Independence: Requires assistive device for independence (walker) Vision Baseline Vision/History: Wears glasses Wears Glasses: Reading only Patient Visual Report: No change from baseline Vision Assessment?: Vision impaired- to be further tested in functional context Additional Comments: Lateral nystagmus per epic review Perception  Perception: Within Functional Limits Praxis Praxis: Intact Cognition Overall Cognitive Status: Impaired/Different from baseline Arousal/Alertness: Awake/alert Orientation Level: Person;Situation;Place Person: Oriented Place: Oriented Situation: Oriented Year: 2022 Month: January Day of Week: Correct Memory: Impaired Memory Impairment: Decreased recall of new information Immediate Memory Recall: Sock;Blue;Bed Memory Recall Sock: Without Cue Memory Recall Blue: Without Cue Memory Recall Bed: With Cue Attention: Sustained Sustained Attention: Appears intact Awareness: Appears intact Problem Solving: Impaired Problem Solving Impairment: Functional complex Safety/Judgment: Appears intact Sensation Sensation Light Touch: Appears Intact Hot/Cold: Appears Intact Proprioception: Impaired by gross assessment (minimally impaired grossly 2/2 functional transfers/pt with difficulty motor planning) Stereognosis: Appears  Intact Coordination Gross Motor Movements are Fluid and Coordinated: No Fine Motor Movements are Fluid and Coordinated: No Coordination and Movement Description: Slowed L thumb-digit opposition with undershooting of target Finger Nose Finger Test: Overshoots with R digits, slowed compared to L digits. Motor  Motor Motor: Hemiplegia;Abnormal postural alignment and control Motor - Skilled Clinical Observations: mild R hemiparesis  Trunk/Postural Assessment  Cervical Assessment Cervical Assessment: Exceptions to [facility] (neck flexion) Thoracic Assessment Thoracic Assessment: Exceptions to [facility] (mod kyphotic posture) Lumbar Assessment Lumbar Assessment: Exceptions to [facility] (posterior pelvic tilt) Postural Control Postural Control: Deficits on evaluation (decreased upright postural control during dynamic sitting/standing activities)  Balance  Balance Balance Assessed: Yes Static Sitting Balance Static Sitting - Balance Support: Feet unsupported Static Sitting - Level of Assistance: 5: Stand by assistance (close S) Dynamic Sitting Balance Dynamic Sitting - Balance Support: Feet supported Dynamic Sitting - Level of Assistance: 5: Stand by assistance (CGA) Static Standing Balance Static Standing - Balance Support: Bilateral upper extremity supported Static Standing - Level of Assistance: 4: Min assist Dynamic Standing Balance Dynamic Standing - Balance Support: Bilateral upper extremity supported Dynamic Standing - Level of Assistance: 4: Min assist Extremity/Trunk Assessment RUE Assessment RUE Assessment: Within Functional Limits Active Range of Motion (AROM) Comments: 3/4 to full shoulder flexion General Strength Comments: 4+/5 in shoulder flexion, 4/5 grip strength LUE Assessment LUE Assessment: Within Functional Limits Active Range of Motion (AROM) Comments: 3/4 to full shoulder flexion General Strength Comments: 4+/5 shoulder flexion, grip strength  Care Tool Care Tool Self  Care Eating   Eating Assist Level: Set up assist    Oral Care    Oral Care Assist Level: Minimal Assistance - Patient > 75%    Bathing   Body parts bathed by patient: Right arm;Left arm;Chest;Buttocks;Front perineal area;Abdomen;Face (Declined LB bathing this date)     Assist Level: Moderate Assistance - Patient 50 - 74%    Upper Body Dressing(including orthotics)   What is the patient wearing?: Pull over shirt   Assist Level: Minimal Assistance - Patient > 75%    Lower Body Dressing (excluding footwear)   What is the patient wearing?: Incontinence brief;Pants Assist for lower body dressing: Moderate Assistance - Patient 50 - 74%    Putting on/Taking off footwear     Assist for footwear: Minimal Assistance - Patient > 75%       Care Tool Toileting Toileting activity   Assist for toileting: Maximal Assistance - Patient 25 - 49%     Care Tool Bed Mobility Roll left and right activity   Roll left and right assist level: Supervision/Verbal cueing    Sit to lying activity   Sit to lying assist level: Minimal Assistance - Patient > 75%    Lying to sitting edge of bed activity   Lying to sitting edge of bed assist level: Minimal Assistance - Patient > 75%     Care Tool Transfers Sit to stand transfer   Sit to stand assist level: Minimal Assistance - Patient > 75%    Chair/bed transfer   Chair/bed transfer assist level: Minimal Assistance - Patient > 75% (stand-pivot no AD)     Toilet transfer   Assist Level: Minimal Assistance - Patient > 75% (stand-pivot no AD)     Care Tool Cognition Expression of Ideas and Wants Expression of Ideas and Wants: Some difficulty - exhibits some difficulty with expressing needs and ideas (e.g, some words or finishing thoughts) or speech is not clear   Understanding Verbal and Non-Verbal Content Understanding Verbal and Non-Verbal Content: Usually understands - understands most conversations, but misses some part/intent of message.  Requires cues at times to understand   Memory/Recall Ability *first 3 days only Memory/Recall Ability *first 3 days only: That he or she is in a hospital/hospital unit;Current season    Refer to Care Plan for [facility] OT Short Term Goal 1 (Week 1): LTG = STG 2/2 ELOS  Recommendations for other services: None    Skilled Therapeutic Intervention ADL ADL Eating: Set up Where Assessed-Eating: Bed level Grooming: Minimal assistance Where Assessed-Grooming: Standing at sink Upper Body Bathing:  Minimal assistance Where Assessed-Upper Body Bathing: Edge of bed Lower Body Bathing: Moderate assistance Where Assessed-Lower Body Bathing: Edge of bed Upper Body Dressing: Minimal assistance Where Assessed-Upper Body Dressing: Edge of bed Lower Body Dressing: Maximal assistance Where Assessed-Lower Body Dressing: Edge of bed Toileting: Maximal assistance Where Assessed-Toileting: Glass blower/designer: Psychiatric nurse Method: Arts development officer: Energy manager: Environmental education officer Method: Radiographer, therapeutic: Shower seat with back Mobility  Bed Mobility Bed Mobility: Rolling Right;Right Sidelying to Sit;Sit to Supine Rolling Right: Supervision/verbal cueing Right Sidelying to Sit: Minimal Assistance - Patient > 75% Sit to Supine: Minimal Assistance - Patient > 75% Transfers Sit to Stand: Minimal Assistance - Patient > 75% Stand to Sit: Minimal Assistance - Patient > 75% Session Note: Pt received eating breakfast in bed, agreeable to OT eval. Reviewed role of CIR OT, evaluation process, ADL/func mobility retraining, goals for therapy, and safety plan with pt and husband [name]. Evaluation completed as documented above with session focus on EOB bathing/dressing/grooming/toileting with above assist levels.  Decreased initiation + motor planning noted during bed  mobility req mod VCs to initiate. During dressing, donned shirt with min A to adjust over body, donned pants with max A to thread BLE and pull over B hips in standing. Pt with incontinent episode of bowel and bladder, when prompted no awareness of her soiled brief. Stand-pivot to w/c then to toilet with min A for balance. Req max A for thoroughness during posterior pericare and clothing management during toileting. Pt with decreased awareness, attempting to amb to w/c without pulling pants up first. Stand-pivot back to bed same manner as before. Sit EOB > supine with min A to readjust trunk in bed. Pt left semi-reclined in bed, bed alarm engaged, call bell in reach, with husband present.   Discharge Criteria: Patient will be discharged from OT if patient refuses treatment 3 consecutive times without medical reason, if treatment goals not met, if there is a change in medical status, if patient makes no progress towards goals or if patient is discharged from hospital.  The above assessment, treatment plan, treatment alternatives and goals were discussed and mutually agreed upon: by patient and by family  Volanda Napoleon, MS, OTR/L 12/11/2020, 10:50 AM

## 2020-12-11 NOTE — Evaluation (Signed)
Speech Language Pathology Assessment and Plan  Patient Details  Name: Kristine Francis MRN: [id number] Date of Birth: 06-Feb-1941  SLP Diagnosis: Dysarthria;Cognitive Impairments;Dysphagia  Rehab Potential: Good ELOS: 1.5-2 weeks    Today's Date: 12/11/2020 SLP Individual Time: 1100-1155 SLP Individual Time Calculation (min): 55 min   Hospital Problem: Principal Problem:   Encephalopathy  Past Medical History:  Past Medical History:  Diagnosis Date  . Allergy   . Asthma    sees Dr. Tiajuana Francis - as child per pt  . At high risk for falls    unstable gait   . Ataxia   . Cataract    removed bilaterally   . Diverticulosis   . Emphysema   . Gait abnormality 05/14/2013   [name] a patient of Dr. Linda Francis presented first interpreter thousand 13 with a gait dysfunction, she also had an episodic confusion and Loss device. Exam found a mild nystagmus and she was referred to ophthalmology on 03-2812, brain MRI was normal nystagmus was a secondary diagnosis to vestibulitis, ear nose and throat has followed and had seen the patient. At the time of the nystagmus the patie  . GERD (gastroesophageal reflux disease)   . Hepatic steatosis   . Hyperlipidemia    on atorvastatin   . Hypertension    on losartan   . Iron deficiency anemia, unspecified   . Kidney cysts 11/16/12   small cyst on left  . Nystagmus, end-position   . Osteopenia   . Renal cyst   . Stroke ([place]) 01/2017   mild    Past Surgical History:  Past Surgical History:  Procedure Laterality Date  . ANAL RECTAL MANOMETRY N/A 12/20/2017   Procedure: ANO RECTAL MANOMETRY;  Surgeon: Kristine Pole, MD;  Location: [facility];  Service: Endoscopy;  Laterality: N/A;  . BREAST BIOPSY Bilateral 1970's  . CATARACT EXTRACTION Bilateral LT:02/14/13,RT:02/21/13  . CERVICAL SPINE SURGERY  2010  . COLONOSCOPY  03-12-14   per Dr. Olevia Francis, diverticulosis only, repeat 5 yrs   . EYE SURGERY Bilateral 01/2013&02/2013   left then right  .  HERNIA REPAIR  Oct '14-left, remote-right   years ago right; left done '14  . SKIN CANCER EXCISION  05/13/13   FACE, basal cell  . TONSILLECTOMY      Assessment / Plan / Recommendation Clinical Impression   HPI: Kristine Francis is a 80 year old female with history of HTN, asthma, hepatic steatosis, vestibulitis, CVA who was admitted on 11/26/19 with right sided weakness, right gaze preference and difficulty speaking. In ED noted to have difficulty handling oral secretions and had tonic clonic seizure loss of pulse requiring 2 minutes of chest compression and intubation for airway protection.   She received IV ativan and Keppra. CT head/CTA negative for LVO or acute changes. MRI brain done revealing multiple foci of susceptibility in bilateral cerebral hemispheres more pronounced in left occipital region question chronic microhemorrhages related to HTN or amyloid angiopathy. She was started on antibiotics for aspiration PNA and acyclovir due to concerns of meningitis and monitored on LT-EEG. EEG without seizures and showed evidence of moderate to severe encephalopathy. She developed A fib in ICU and Eliquis added due to CHADS-VASc-6. Work up negative for infection and she was extubated to 4 L oxygen by 01/08. Bouts of agitation due to delirium have resolved. She was started on dysphagia 2, honey liquids but intake variable. Therapy ongoing and patient limited by weakness, ataxia, vertigo with gaze instability and cognitive deficits. Pt was admitted  to CIR at [facility] on 12/10/20 and SLP evaluations were completed 12/11/20 with results as follows:  Pt presents with mild oral and moderate acute on chronic pharyngeal dysphagia. Her mastication of Dys 2 and Dys 3 textures was similar in efficiency and complete oral clearance. She did require Mod-Max cueing to use compensatory swallow strategies with intake of honey thick liquids, although no overt s/sx aspiration observed. It should be noted that last MBSS conducted  on 1/14 indicated inadequate/reduced sensation of penetrates. Recommend pt remain on current Dys 2 (minced/ground) textures with honey thick liquids, using intermittent throat clear and extra dry swallows and full supervision for use of compensatory swallow strategies.   Pt also presents with mild dysarthria marked by slight articulatory imprecision, which was more significant on first arrival when pt was just arousing from sleep. As she became more awake dysarthria less pronounces but still evident and she would benefit from learning compensatory strategies to maximize intelligibility. Pt exhibited a moderately severe impairment in short term memory, currently impacting her functional recall and carryover of important new information. She also demonstrated decreased intellectual awareness, requiring mostly Total A from her husband to identify current deficits, or Max A cueing from SLP.  Recommend pt receive skilled ST while inpatient to address dysphagia, dysarthria, and cognitive impairments in order to ensure diet safety and efficiency as well as maximize her functional communication, safety, and independence prior to discharge home.    Skilled Therapeutic Interventions          Bedside swallow and cognitive-linguistic evaluations were administered and results were reviewed with pt (please see above for details regarding the results).   SLP Assessment  Patient will need skilled Speech Lanaguage Pathology Services during CIR admission    Recommendations  SLP Diet Recommendations: Dysphagia 2 (Fine chop);Honey Liquid Administration via: Cup;Straw Medication Administration: Crushed with puree Supervision: Patient able to self feed;Full supervision/cueing for compensatory strategies Compensations: Slow rate;Small sips/bites;Multiple dry swallows after each bite/sip;Clear throat intermittently Postural Changes and/or Swallow Maneuvers: Seated upright 90 degrees Oral Care Recommendations: Oral care  BID Patient destination: Home Follow up Recommendations: 24 hour supervision/assistance;Outpatient SLP Equipment Recommended: None recommended by SLP    SLP Frequency 3 to 5 out of 7 days   SLP Duration  SLP Intensity  SLP Treatment/Interventions 1.5-2 weeks  Minumum of 1-2 x/day, 30 to 90 minutes  Cognitive remediation/compensation;Cueing hierarchy;Dysphagia/aspiration precaution training;Functional tasks;Patient/family education;Speech/Language facilitation;Internal/external aids    Pain Pain Assessment Pain Scale: 0-10 Pain Score: 0-No pain  Prior Functioning Type of Home: House  Lives With: Spouse Available Help at Discharge: Family;Available 24 hours/day (husband [name])  SLP Evaluation Cognition Overall Cognitive Status: Impaired/Different from baseline Arousal/Alertness: Awake/alert Orientation Level: Oriented X4 Attention: Sustained Sustained Attention: Appears intact Memory: Impaired Memory Impairment: Decreased recall of new information;Decreased short term memory Decreased Short Term Memory: Verbal basic;Functional basic Immediate Memory Recall: Sock;Blue;Bed Memory Recall Sock: Without Cue Memory Recall Blue: Without Cue Memory Recall Bed: With Cue Awareness: Impaired Awareness Impairment: Intellectual impairment Problem Solving: Impaired Problem Solving Impairment: Functional complex Safety/Judgment: Appears intact  Comprehension Auditory Comprehension Overall Auditory Comprehension: Appears within functional limits for tasks assessed Conversation: Complex Visual Recognition/Discrimination Discrimination: Within Function Limits Reading Comprehension Reading Status: Not tested Expression Expression Primary Mode of Expression: Verbal Verbal Expression Overall Verbal Expression: Appears within functional limits for tasks assessed Written Expression Dominant Hand: Right Written Expression: Not tested Oral Motor Oral Motor/Sensory Function Overall  Oral Motor/Sensory Function: Within functional limits Motor Speech Overall Motor Speech: Impaired Respiration: Within  functional limits Phonation: Normal Resonance: Within functional limits Articulation: Impaired Level of Impairment: Conversation Intelligibility: Intelligibility reduced Word: 75-100% accurate Phrase: 75-100% accurate Sentence: 75-100% accurate Conversation: 75-100% accurate Motor Planning: Witnin functional limits Motor Speech Errors: Not applicable Effective Techniques: Over-articulate  Care Tool Care Tool Cognition Expression of Ideas and Wants Expression of Ideas and Wants: Some difficulty - exhibits some difficulty with expressing needs and ideas (e.g, some words or finishing thoughts) or speech is not clear   Understanding Verbal and Non-Verbal Content Understanding Verbal and Non-Verbal Content: Usually understands - understands most conversations, but misses some part/intent of message. Requires cues at times to understand   Memory/Recall Ability *first 3 days only Memory/Recall Ability *first 3 days only: That he or she is in a hospital/hospital unit;Current season     Intelligibility: Intelligibility reduced Word: 75-100% accurate Phrase: 75-100% accurate Sentence: 75-100% accurate Conversation: 75-100% accurate  Bedside Swallowing Assessment General Date of Onset: 11/25/20 Previous Swallow Assessment: BSE 11/30/20; MBSS 12/04/20 Diet Prior to this Study: Dysphagia 2 (chopped);Honey-thick liquids Temperature Spikes Noted: No Respiratory Status: Room air History of Recent Intubation: Yes Length of Intubations (days): 3 days Date extubated: 11/28/20 Behavior/Cognition: Alert;Cooperative;Pleasant mood Oral Cavity - Dentition: Adequate natural dentition Self-Feeding Abilities: Able to feed self Vision: Functional for self-feeding Patient Positioning: Upright in bed Baseline Vocal Quality: Low vocal intensity;Normal Volitional Cough:  Strong Volitional Swallow: Able to elicit  Oral Care Assessment Does patient have any of the following "high(er) risk" factors?: None of the above Does patient have any of the following "at risk" factors?: Other - dysphagia;Diet - patient on thickened liquids Patient is AT RISK: Order set for Adult Oral Care Protocol initiated -  "At Risk Patients" option selected (see row information) Patient is LOW RISK: Follow universal precautions (see row information) Ice Chips Ice chips: Not tested Thin Liquid Thin Liquid: Not tested Nectar Thick Nectar Thick Liquid: Not tested Honey Thick Honey Thick Liquid: Impaired Pharyngeal Phase Impairments: Multiple swallows Puree Puree: Within functional limits Solid Solid: Impaired Pharyngeal Phase Impairments: Multiple swallows BSE Assessment Risk for Aspiration Impact on safety and function: Moderate aspiration risk Other Related Risk Factors: History of GERD;Previous CVA;Cognitive impairment  Short Term Goals: Week 1: SLP Short Term Goal 1 (Week 1): Pt will consume trials of dysphagia 3 textures with efficient mastication and oral clearance and Min s/sx aspiration across 2 sessions prior to upgrade. SLP Short Term Goal 2 (Week 1): Pt will perform pharyngeal strengtheing and glottal closure exercises with Min A verbal/visual cues. SLP Short Term Goal 3 (Week 1): Pt will demonstrate ability to problem solve mildly complex functional situations with Min A verbal/visual cues. SLP Short Term Goal 4 (Week 1): Pt will recall new and/or daily functional information with Mod A cues for use of aids or strategies. SLP Short Term Goal 5 (Week 1): Pt will identify 3 current deficits (some physical, some ST related - speech, swallow, cognition) with Mod A cues. SLP Short Term Goal 6 (Week 1): Pt will use compensatory strategies for speech intelligibility to achieved 95% intelligibility in conversatoin with Supervision A cues.  Refer to Care Plan for Long Term  Goals  Recommendations for other services: None   Discharge Criteria: Patient will be discharged from SLP if patient refuses treatment 3 consecutive times without medical reason, if treatment goals not met, if there is a change in medical status, if patient makes no progress towards goals or if patient is discharged from hospital.  The above assessment, treatment plan, treatment  alternatives and goals were discussed and mutually agreed upon: by patient and her husband  Arbutus Leas 12/11/2020, 12:27 PM

## 2020-12-12 ENCOUNTER — Inpatient Hospital Stay (HOSPITAL_COMMUNITY): Payer: Medicare Other | Admitting: Physical Therapy

## 2020-12-12 ENCOUNTER — Inpatient Hospital Stay (HOSPITAL_COMMUNITY): Payer: Medicare Other

## 2020-12-12 ENCOUNTER — Inpatient Hospital Stay (HOSPITAL_COMMUNITY): Payer: Medicare Other | Admitting: Occupational Therapy

## 2020-12-12 LAB — OCCULT BLOOD X 1 CARD TO LAB, STOOL: Fecal Occult Bld: POSITIVE — AB

## 2020-12-12 NOTE — Progress Notes (Signed)
[facility] PHYSICAL MEDICINE & REHABILITATION PROGRESS NOTE   Subjective/Complaints:   Pt reports having difficulty getting ahold of husband on hospital phone.   LBM yesterday- denies any other issues.    ROS-  Pt denies SOB, abd pain, CP, N/V/C/D, and vision changes  Objective:   No results found. Recent Labs    12/11/20 0520  WBC 9.9  HGB 8.2*  HCT 25.4*  PLT 739*   Recent Labs    12/11/20 0520  NA 132*  K 4.1  CL 97*  CO2 26  GLUCOSE 99  BUN 12  CREATININE 0.56  CALCIUM 8.2*    Intake/Output Summary (Last 24 hours) at 12/12/2020 1245 Last data filed at 12/11/2020 1818 Gross per 24 hour  Intake 240 ml  Output --  Net 240 ml     Pressure Injury 12/10/20 Sacrum Mid Stage 2 -  Partial thickness loss of dermis presenting as a shallow open injury with a red, pink wound bed without slough. Small circular area at tail bone, center (Active)  12/10/20 1505  Location: Sacrum  Location Orientation: Mid  Staging: Stage 2 -  Partial thickness loss of dermis presenting as a shallow open injury with a red, pink wound bed without slough.  Wound Description (Comments): Small circular area at tail bone, center  Present on Admission: Yes    Physical Exam: Vital Signs Blood pressure 114/69, pulse 70, temperature 98.2 F (36.8 C), resp. rate 15, height 5\' 1"  (1.549 m), weight 42.5 kg, SpO2 100 %.   General: No acute distress- sitting up in bed; appropriate, but poor memory, NAD Mood and affect are appropriate Heart: RRR Lungs: CTA B/L- no W/R/R- good air movement Abdomen: Soft, NT, ND, (+)BS  Extremities: No clubbing, cyanosis, or edema Skin: No evidence of breakdown, no evidence of rash Neurologic: Cranial nerves II through XII intact, motor strength is 5/5 in bilateral deltoid, bicep, tricep, grip, hip flexor, knee extensors, ankle dorsiflexor and plantar flexor Sensory exam normal sensation to light touch and proprioception in bilateral upper and lower  extremities Cerebellar exam mild dysmetria BUE  Musculoskeletal: Full range of motion in all 4 extremities. No joint swelling  Assessment/Plan: 1. Functional deficits which require 3+ hours per day of interdisciplinary therapy in a comprehensive inpatient rehab setting.  Physiatrist is providing close team supervision and 24 hour management of active medical problems listed below.  Physiatrist and rehab team continue to assess barriers to discharge/monitor patient progress toward functional and medical goals  Care Tool:  Bathing    Body parts bathed by patient: Right arm,Left arm,Chest,Buttocks,Front perineal area,Abdomen,Face (Declined LB bathing this date)         Bathing assist Assist Level: Moderate Assistance - Patient 50 - 74%     Upper Body Dressing/Undressing Upper body dressing   What is the patient wearing?: Pull over shirt    Upper body assist Assist Level: Minimal Assistance - Patient > 75%    Lower Body Dressing/Undressing Lower body dressing      What is the patient wearing?: Incontinence brief     Lower body assist Assist for lower body dressing: Dependent - Patient 0%     Toileting Toileting    Toileting assist Assist for toileting: Dependent - Patient 0%     Transfers Chair/bed transfer  Transfers assist     Chair/bed transfer assist level: Minimal Assistance - Patient > 75%     Locomotion Ambulation   Ambulation assist      Assist level: Minimal Assistance -  Patient > 75% Assistive device: Walker-rolling Max distance: 20   Walk 10 feet activity   Assist     Assist level: Minimal Assistance - Patient > 75%     Walk 50 feet activity   Assist Walk 50 feet with 2 turns activity did not occur: Safety/medical concerns         Walk 150 feet activity   Assist Walk 150 feet activity did not occur: Safety/medical concerns         Walk 10 feet on uneven surface  activity   Assist Walk 10 feet on uneven surfaces  activity did not occur: Safety/medical concerns         Wheelchair     Assist Will patient use wheelchair at discharge?: No Type of Wheelchair: Manual    Wheelchair assist level: Maximal Assistance - Patient 25 - 49% Max wheelchair distance: 75    Wheelchair 50 feet with 2 turns activity    Assist        Assist Level: Maximal Assistance - Patient 25 - 49%   Wheelchair 150 feet activity     Assist  Wheelchair 150 feet activity did not occur: Safety/medical concerns       Blood pressure 114/69, pulse 70, temperature 98.2 F (36.8 C), resp. rate 15, height 5\' 1"  (1.549 m), weight 42.5 kg, SpO2 100 %.  Medical Problem List and Plan: 1.  Debility and hypoxic encephalopathy  - moderate to severe secondary to tonic clonic seizure, amyloid angiopathy, pneumonia and new onset Afib with RVR             -patient may  shower             -ELOS/Goals: 12-14 days- min A to mod I 2.  Antithrombotics: -DVT/anticoagulation:  Pharmaceutical: Other (comment)--on Eliquis- also for Afib with RVR             -antiplatelet therapy: N/a 3. Pain Management: N/A 4. Mood: LCSW to follow for evaluation and support.              -antipsychotic agents: N/A 5. Neuropsych: This patient is not fully capable of making decisions on her own behalf. 6. Skin/Wound Care: Routine pressure relief measures.  7. Fluids/Electrolytes/Nutrition: Monitor I/O. Check lytes in am.  8. PAF: New onset--continue Metoprolol and eliquis BID. Vitals:   12/11/20 1934 12/12/20 0446  BP: (!) 147/67 114/69  Pulse: 81 70  Resp: 16 15  Temp: 98.4 F (36.9 C) 98.2 F (36.8 C)  SpO2: 99% 100%   1/22- rate controlled- con't regimen  9. Dysphagia: On D2, honey liquids. Monitor hydration with check of lytes in am and next week.discussed normal progression to thin liquids  1/22- tolerating breakfast- con't regimen and recheck Monday  10.  Hyponateremia: Will recheck Na in am--question dilutional.  11. Acute on  chronic Iron deficiency anemia:H/H down from 11-->8.7. Check stool guaiacs. Monitor for signs of bleeding. Will add iron supplement. 12. Leukocytosis:due to PNA  Resolved 13. Seizure prophylaxis: Stable on Keppra. Tolerating without SE.     LOS: 2 days A FACE TO FACE EVALUATION WAS PERFORMED  Jvion Turgeon 12/12/2020, 12:45 PM

## 2020-12-12 NOTE — Progress Notes (Signed)
Occupational Therapy Session Note  Patient Details  Name: Kristine Francis MRN: [id number] Date of Birth: 12-06-1940  Today's Date: 12/12/2020 OT Individual Time: 5681-2751 OT Individual Time Calculation (min): 63 min    Short Term Goals: Week 1:  OT Short Term Goal 1 (Week 1): LTG = STG 2/2 ELOS  Skilled Therapeutic Interventions/Progress Updates:    Pt seen this session to focus on coordination and balance to enable her to be more independent with her self care. Pt's spouse present. Pt received in wc stating she did not need to toilet and agreeable to therapy. She stated that she washed up earlier today.  Throughout the session, pt would often keep her head turned to the left but could scan to the R with cues and did not demonstrate R field cut.  Pt worked on sit to stands to [employer] focusing on hand placement.  She has ataxia throughout her trunk and sways so focused on engaging her core.  Worked on balance stabilization with lifting 1 knee at a time with her demonstrating weakness in R hip stabilizers. Practiced standing with RW and lifting L foot to force engagement of R hip.  Her standing tolerance was very limited to a minute or so and she often had difficulty following directions verbally and did better with visual cues. For postural control and finding center of balance, she worked on slow rocks from balls of feet to heels.  Practiced stepping one foot forward and back. With all of these activities, heavy reliance on RW for support and mod A for balance.    UE coordination with donning pillow cases on pillows with extra time.  UE strength with eccentric wc pushups with stabilizing support to wc to prevent it from sliding as she relies heavily on her legs with the push up phase.    Stand pivot min to bed to work on core stabilization with modified ab crunches, knee twists for obliques, single leg extensions for coordination, bridges. Pt needed mod A to keep her feet stabilized on bed during  exercises as she had difficulty keeping them in place due to weakness and incoordination.    At end of session, noticed pt's pants were wet, checked her brief and it was soaked.  Pt was unaware.  Pt needed max A to doff and don pants in bed having great difficulty with grasping and holding onto pants and maintaining a bridge.  Pt ended up rolling so I could adjust pt for her.  Brief changed, pt cleansed with cloths with pt washing front perineal area.    Pt resting in bed with all needs met and bed alarm set.    Therapy Documentation Precautions:  Precautions Precautions: Fall,Other (comment) Precaution Comments: seizure Restrictions Weight Bearing Restrictions: No    Vital Signs: Therapy Vitals Temp: 98 F (36.7 C) Temp Source: Oral Pulse Rate: 76 Resp: 17 BP: 115/63 Patient Position (if appropriate): Sitting Oxygen Therapy SpO2: 100 % O2 Device: Room Air    Pain: no c/o pain       Therapy/Group: Individual Therapy  Jamont Mellin 12/12/2020, 4:27 PM

## 2020-12-12 NOTE — Progress Notes (Signed)
Physical Therapy Session Note  Patient Details  Name: Kristine Francis MRN: [id number] Date of Birth: [birth year]  Today's Date: 12/12/2020 PT Individual Time: 1105-1215 PT Individual Time Calculation (min): 70 min   Short Term Goals: Week 1:  PT Short Term Goal 1 (Week 1): pt to demonstrate supine<>sit CGA consistently PT Short Term Goal 2 (Week 1): pt to demonstrate functional transfers with LRAD at [facility] PT Short Term Goal 3 (Week 1): pt to demonstrate ambulation with RW for 50' min A PT Short Term Goal 4 (Week 1): pt to demonstrate dynamic standing balance at min A consistently  Skilled Therapeutic Interventions/Progress Updates:    Pt received supine in bed attempting to use telephone to call her husband. Therapist educated pt on how to use phone but no answer from husband. Pt agreeable to therapy session. Pt reports need to use bathroom and unaware that she did not have on pants - noted to be incontinent of bladder. Supine>sitting R EOB, HOB partially elevated and using bedrail, with CGA for steadying. Pt noted to have significant trunk ataxia with increased postural sway - able to maintain balance using UE support as needed with close supervision. Sit<>stands using RW with min assist for lifting/balance and max cuing for proper UE placement on AD - continues to require max cuing for proper UE placement during transfers throughout session with no carryover. Gait training ~24ft 2x to/from bathroom using RW, min assist for balance, demos excessive thoracic kyphosis with increased anterior trunk lean and hip flexion causing her to push AD too far forward requiring cuing/assist for AD management - decreased BLE step lengths and foot clearance with impaired B LE coordination. Standing with B UE support on RW and CGA for steadying during total assist LB clothing management. Sit<>stand BSC over toilet<>RW with min assist - continent void of bladder. While sitting on BSC over toilet donned LB clothing and  tennis shoes with max assist for time management. Standing with 1 UE support on RW and min assist for balance pt completed peri-care with set-up assist. Ambulated to sink as described above and performed standing hand hygiene with min assist for balance and pt attempting to lean forward and use forearm support on counter for balance. Pt's husband, [name], arriving. Transported to/from gym in w/c for time management and energy conservation. L stand pivot w/c>EOM using RW with min assist for balance and max cuing for sequencing AD. Patient participated in [facility] and demonstrates increased fall risk as noted by score of   5/56.  (<36= high risk for falls, close to 100%; 37-45 significant >80%; 46-51 moderate >50%; 52-55 lower >25%). Pt unable to maintain static standing balance without AD requiring mod/max assist due to posterior hip lean and upon attempts at correcting has impaired force regulation causing over-correction.Completed 2 bouts of TUG using RW with CGA/min assist for balance requiring 93seconds then 95 seconds demonstrating significant fall risk and pt continuing to demonstrate above gait impairments.  Participated in the following standing balance and R UE NMR tasks:  - card matching with L UE support on RW and CGA for steadying progressed to cleaning cards to complete bimanual task without UE support and min assist for balance due to increased anterior/posterior (primarily) sway - horseshoe game with L UE support on RW and CGA/min assist for steadying Pt requires frequent seated rest breaks and reports fatigue at end of session. Transported back to room and agreeable to remain sitting up in w/c for meal - left with needs  in reach, seat belt alarm on, and her husband present.   Therapy Documentation Precautions:  Precautions Precautions: Fall,Other (comment) Precaution Comments: seizure Restrictions Weight Bearing Restrictions: No  Pain: Denies pain during  session.  Balance: Standardized Balance Assessment Standardized Balance Assessment: Berg Balance Test;Timed Up and Go Test Berg Balance Test Sit to Stand: Needs minimal aid to stand or to stabilize Standing Unsupported: Unable to stand 30 seconds unassisted Sitting with Back Unsupported but Feet Supported on Floor or Stool: Able to sit 2 minutes under supervision Stand to Sit: Needs assistance to sit Transfers: Needs one person to assist Standing Unsupported with Eyes Closed: Needs help to keep from falling Standing Ubsupported with Feet Together: Needs help to attain position and unable to hold for 15 seconds From Standing, Reach Forward with Outstretched Arm: Loses balance while trying/requires external support From Standing Position, Pick up Object from Floor: Unable to try/needs assist to keep balance From Standing Position, Turn to Look Behind Over each Shoulder: Needs assist to keep from losing balance and falling Turn 360 Degrees: Needs assistance while turning Standing Unsupported, Alternately Place Feet on Step/Stool: Needs assistance to keep from falling or unable to try Standing Unsupported, One Foot in Front: Loses balance while stepping or standing Standing on One Leg: Unable to try or needs assist to prevent fall Total Score: 5 Timed Up and Go Test TUG: Normal TUG Normal TUG (seconds): 93 and 95 seconds     Therapy/Group: Individual Therapy  Tawana Scale , PT, DPT, CSRS  12/12/2020, 11:35 AM

## 2020-12-12 NOTE — Progress Notes (Signed)
Speech Language Pathology Daily Session Note  Patient Details  Name: Kristine Francis MRN: [id number] Date of Birth: 08-Apr-1941  Today's Date: 12/12/2020 SLP Individual Time: 0900-0940 SLP Individual Time Calculation (min): 40 min  Short Term Goals: Week 1: SLP Short Term Goal 1 (Week 1): Pt will consume trials of dysphagia 3 textures with efficient mastication and oral clearance and Min s/sx aspiration across 2 sessions prior to upgrade. SLP Short Term Goal 2 (Week 1): Pt will perform pharyngeal strengtheing and glottal closure exercises with Min A verbal/visual cues. SLP Short Term Goal 3 (Week 1): Pt will demonstrate ability to problem solve mildly complex functional situations with Min A verbal/visual cues. SLP Short Term Goal 4 (Week 1): Pt will recall new and/or daily functional information with Mod A cues for use of aids or strategies. SLP Short Term Goal 5 (Week 1): Pt will identify 3 current deficits (some physical, some ST related - speech, swallow, cognition) with Mod A cues. SLP Short Term Goal 6 (Week 1): Pt will use compensatory strategies for speech intelligibility to achieved 95% intelligibility in conversatoin with Supervision A cues.  Skilled Therapeutic Interventions: Pt seen for skilled ST focusing on dysphagia and cognition goals. Pt presents with AM meal, requiring re-education re: current diet texture recommendations, compensatory swallow strategies and aspiration precautions. Pt with no recall of previous MBSS, reviewed results and recs. With Dys 2 solids, occasional throat clear after swallow of crumbly, dry sausage, no s/s aspiration with other food items. Pt with min diffuse oral stasis after initial swallow of Dys 2 textures, effectively cleared with independent use of liquid or puree wash. Pt with one large coughing episode following HTL, likely due to large sip. When cued to reduce bolus size, s/s aspiration were eliminated. SLP providing memory notebook to aide in  recall and carryover of therapeutic activities. Pt oriented x4, however presents with intermittent confusion throughout and decreased recall of recent medical events/medical history. Patient stating things like "Are you Blaire?", "How many grandkids do you have?" and thinking she had been watching her grandkids at home 3 days ago. Pt was provided corrected responses to questions, however carryover of information inconsistent. Pt left in bed with alarm set and all needs within reach. Cont ST POC.   Pain Pain Assessment Pain Scale: 0-10 Pain Score: 0-No pain  Therapy/Group: Individual Therapy  Dewaine Conger 12/12/2020, 9:40 AM

## 2020-12-13 MED ORDER — INFLUENZA VAC A&B SA ADJ QUAD 0.5 ML IM PRSY
0.5000 mL | PREFILLED_SYRINGE | INTRAMUSCULAR | Status: AC
  Administered 2020-12-14: 0.5 mL via INTRAMUSCULAR
  Filled 2020-12-13: qty 0.5

## 2020-12-13 NOTE — Progress Notes (Signed)
[facility] PHYSICAL MEDICINE & REHABILITATION PROGRESS NOTE   Subjective/Complaints:   Pt reports no issues- LBM this AM.  Denies pain.   Slept well- just wants coffee- I spoke to Nursing and dietician helper- we will try to get thickened coffee for pt if possible.    ROS-   Pt denies SOB, abd pain, CP, N/V/C/D, and vision changes   Objective:   No results found. Recent Labs    12/11/20 0520  WBC 9.9  HGB 8.2*  HCT 25.4*  PLT 739*   Recent Labs    12/11/20 0520  NA 132*  K 4.1  CL 97*  CO2 26  GLUCOSE 99  BUN 12  CREATININE 0.56  CALCIUM 8.2*    Intake/Output Summary (Last 24 hours) at 12/13/2020 1307 Last data filed at 12/13/2020 0944 Gross per 24 hour  Intake 360 ml  Output 550 ml  Net -190 ml     Pressure Injury 12/10/20 Sacrum Mid Stage 2 -  Partial thickness loss of dermis presenting as a shallow open injury with a red, pink wound bed without slough. Small circular area at tail bone, center (Active)  12/10/20 1505  Location: Sacrum  Location Orientation: Mid  Staging: Stage 2 -  Partial thickness loss of dermis presenting as a shallow open injury with a red, pink wound bed without slough.  Wound Description (Comments): Small circular area at tail bone, center  Present on Admission: Yes    Physical Exam: Vital Signs Blood pressure 108/76, pulse 68, temperature 97.9 F (36.6 C), resp. rate 15, height 5\' 1"  (1.549 m), weight 38.5 kg, SpO2 98 %.   General: sitting up eating breakfast, appropriate, poor memory, NAD Mood and affect are appropriate Heart: RRR Lungs: CTA B/L- no W/R/R- good air movement Abdomen: Soft, NT, ND, (+)BS  Extremities: No clubbing, cyanosis, or edema Skin: No evidence of breakdown, no evidence of rash Neurologic: Cranial nerves II through XII intact, motor strength is 5/5 in bilateral deltoid, bicep, tricep, grip, hip flexor, knee extensors, ankle dorsiflexor and plantar flexor Sensory exam normal sensation to light touch  and proprioception in bilateral upper and lower extremities Cerebellar exam mild dysmetria BUE  Musculoskeletal: Full range of motion in all 4 extremities. No joint swelling  Assessment/Plan: 1. Functional deficits which require 3+ hours per day of interdisciplinary therapy in a comprehensive inpatient rehab setting.  Physiatrist is providing close team supervision and 24 hour management of active medical problems listed below.  Physiatrist and rehab team continue to assess barriers to discharge/monitor patient progress toward functional and medical goals  Care Tool:  Bathing    Body parts bathed by patient: Right arm,Left arm,Chest,Buttocks,Front perineal area,Abdomen,Face (Declined LB bathing this date)         Bathing assist Assist Level: Moderate Assistance - Patient 50 - 74%     Upper Body Dressing/Undressing Upper body dressing   What is the patient wearing?: Pull over shirt    Upper body assist Assist Level: Minimal Assistance - Patient > 75%    Lower Body Dressing/Undressing Lower body dressing      What is the patient wearing?: Incontinence brief     Lower body assist Assist for lower body dressing: Dependent - Patient 0%     Toileting Toileting    Toileting assist Assist for toileting: Dependent - Patient 0%     Transfers Chair/bed transfer  Transfers assist     Chair/bed transfer assist level: Minimal Assistance - Patient > 75% Chair/bed transfer assistive device:  Walker,Armrests   Locomotion Ambulation   Ambulation assist      Assist level: Minimal Assistance - Patient > 75% Assistive device: Walker-rolling Max distance: 74ft   Walk 10 feet activity   Assist     Assist level: Minimal Assistance - Patient > 75% Assistive device: Walker-rolling   Walk 50 feet activity   Assist Walk 50 feet with 2 turns activity did not occur: Safety/medical concerns         Walk 150 feet activity   Assist Walk 150 feet activity did not  occur: Safety/medical concerns         Walk 10 feet on uneven surface  activity   Assist Walk 10 feet on uneven surfaces activity did not occur: Safety/medical concerns         Wheelchair     Assist Will patient use wheelchair at discharge?: No Type of Wheelchair: Manual    Wheelchair assist level: Maximal Assistance - Patient 25 - 49% Max wheelchair distance: 75    Wheelchair 50 feet with 2 turns activity    Assist        Assist Level: Maximal Assistance - Patient 25 - 49%   Wheelchair 150 feet activity     Assist  Wheelchair 150 feet activity did not occur: Safety/medical concerns       Blood pressure 108/76, pulse 68, temperature 97.9 F (36.6 C), resp. rate 15, height 5\' 1"  (1.549 m), weight 38.5 kg, SpO2 98 %.  Medical Problem List and Plan: 1.  Debility and hypoxic encephalopathy  - moderate to severe secondary to tonic clonic seizure, amyloid angiopathy, pneumonia and new onset Afib with RVR             -patient may  shower             -ELOS/Goals: 12-14 days- min A to mod I 2.  Antithrombotics: -DVT/anticoagulation:  Pharmaceutical: Other (comment)--on Eliquis- also for Afib with RVR             -antiplatelet therapy: N/a 3. Pain Management: N/A 4. Mood: LCSW to follow for evaluation and support.              -antipsychotic agents: N/A 5. Neuropsych: This patient is not fully capable of making decisions on her own behalf.  1/23- very poor memory- con't regimen 6. Skin/Wound Care: Routine pressure relief measures.  7. Fluids/Electrolytes/Nutrition: Monitor I/O. Check lytes in am.  8. PAF: New onset--continue Metoprolol and eliquis BID. Vitals:   12/12/20 1953 12/13/20 0338  BP: 140/80 108/76  Pulse: 81 68  Resp: 15 15  Temp: 98.6 F (37 C) 97.9 F (36.6 C)  SpO2: 99% 98%   1/23- rate controlled- con't regimen 9. Dysphagia: On D2, honey liquids. Monitor hydration with check of lytes in am and next week.discussed normal progression  to thin liquids  1/22- tolerating breakfast- con't regimen and recheck Monday  1/23- pt asking for coffee- will try to arrange thickened coffee for pt  10.  Hyponateremia: Will recheck Na in am--question dilutional  1/23- Na 132- will recheck labs next week, esp since on thickened liquids.   11. Acute on chronic Iron deficiency anemia:H/H down from 11-->8.7. Check stool guaiacs. Monitor for signs of bleeding. Will add iron supplement. 12. Leukocytosis:due to PNA  Resolved 13. Seizure prophylaxis: Stable on Keppra. Tolerating without SE.     LOS: 3 days A FACE TO FACE EVALUATION WAS PERFORMED  Ramiel Forti 12/13/2020, 1:07 PM

## 2020-12-14 ENCOUNTER — Inpatient Hospital Stay (HOSPITAL_COMMUNITY): Payer: Medicare Other | Admitting: Occupational Therapy

## 2020-12-14 ENCOUNTER — Inpatient Hospital Stay (HOSPITAL_COMMUNITY): Payer: Medicare Other | Admitting: Physical Therapy

## 2020-12-14 ENCOUNTER — Inpatient Hospital Stay (HOSPITAL_COMMUNITY): Payer: Medicare Other | Admitting: Speech Pathology

## 2020-12-14 LAB — BASIC METABOLIC PANEL
Anion gap: 6 (ref 5–15)
BUN: 11 mg/dL (ref 8–23)
CO2: 28 mmol/L (ref 22–32)
Calcium: 8.3 mg/dL — ABNORMAL LOW (ref 8.9–10.3)
Chloride: 100 mmol/L (ref 98–111)
Creatinine, Ser: 0.52 mg/dL (ref 0.44–1.00)
GFR, Estimated: >60 mL/min (ref >60)
Glucose, Bld: 93 mg/dL (ref 70–99)
Potassium: 4 mmol/L (ref 3.5–5.1)
Sodium: 134 mmol/L — ABNORMAL LOW (ref 135–145)

## 2020-12-14 LAB — OCCULT BLOOD X 1 CARD TO LAB, STOOL: Fecal Occult Bld: NEGATIVE

## 2020-12-14 LAB — CBC
HCT: 26.6 % — ABNORMAL LOW (ref 36.0–46.0)
Hemoglobin: 8.4 g/dL — ABNORMAL LOW (ref 12.0–15.0)
MCH: 29.7 pg (ref 26.0–34.0)
MCHC: 31.6 g/dL (ref 30.0–36.0)
MCV: 94 fL (ref 80.0–100.0)
Platelets: 787 10*3/uL — ABNORMAL HIGH (ref 150–400)
RBC: 2.83 MIL/uL — ABNORMAL LOW (ref 3.87–5.11)
RDW: 14.4 % (ref 11.5–15.5)
WBC: 8 10*3/uL (ref 4.0–10.5)
nRBC: 0 % (ref 0.0–0.2)

## 2020-12-14 LAB — URINALYSIS, ROUTINE W REFLEX MICROSCOPIC
Bilirubin Urine: NEGATIVE
Glucose, UA: NEGATIVE mg/dL
Hgb urine dipstick: NEGATIVE
Ketones, ur: NEGATIVE mg/dL
Leukocytes,Ua: NEGATIVE
Nitrite: NEGATIVE
Protein, ur: NEGATIVE mg/dL
Specific Gravity, Urine: 1.012 (ref 1.005–1.030)
pH: 7 (ref 5.0–8.0)

## 2020-12-14 MED ORDER — PANTOPRAZOLE SODIUM 20 MG PO TBEC
20.0000 mg | DELAYED_RELEASE_TABLET | Freq: Every day | ORAL | Status: DC
  Administered 2020-12-14 – 2020-12-24 (×11): 20 mg via ORAL
  Filled 2020-12-14 (×11): qty 1

## 2020-12-14 MED ORDER — BETHANECHOL CHLORIDE 10 MG PO TABS
10.0000 mg | ORAL_TABLET | Freq: Four times a day (QID) | ORAL | Status: DC
  Administered 2020-12-14 – 2020-12-23 (×36): 10 mg via ORAL
  Filled 2020-12-14 (×36): qty 1

## 2020-12-14 NOTE — Progress Notes (Signed)
Physical Therapy Session Note  Patient Details  Name: Kristine Francis MRN: [id number] Date of Birth: [birth year]  Today's Date: 12/14/2020 PT Individual Time: 1415-1530 PT Individual Time Calculation (min): 75 min   Short Term Goals: Week 1:  PT Short Term Goal 1 (Week 1): pt to demonstrate supine<>sit CGA consistently PT Short Term Goal 2 (Week 1): pt to demonstrate functional transfers with LRAD at [facility] PT Short Term Goal 3 (Week 1): pt to demonstrate ambulation with RW for 50' min A PT Short Term Goal 4 (Week 1): pt to demonstrate dynamic standing balance at min A consistently  Skilled Therapeutic Interventions/Progress Updates:    pt received in sitting in chair beside husband and agreeable to therapy. Pt required max A for donning B shoes in sitting. Pt taken to gym total A in [facility] for time and energy. Pt directed in dynamic standing balance at [facility] for x2 activities with forward reaching in various directions with one UE supported at walker at [facility] with VC frequently for directions of tasks and safety awareness. Pt directed in x2 activities at [facility] in standing without UE support at CGA-min A for stability noted sway and x3 LOB in various directions with reaching, limited protective and righting reactions noted, min A to correct. Pt directed in 6 mins at Nustep L3 with pt reporting on shortness of breath, chest pain or adverse effects for improved reciprocal BLE and BUE movement and overall strengthening and activity tolerance; Stand pivot transfer to and from min A. Pt directed in gait training with Rolling walker for 125' min A for stability with VC for increased stride length, trunk extension, and increased B step height. Pt directed in underhand bean bag toss x15 bags to target 4' away min A for anterior/posterior sway and pelvic instability frequent VC for reaching and looking at where to retrieve  Objects and then passing bag to opposite hand before tossing. Pt then directed in gait training  200' min A with VC for attention to task, increased R hip flexion, and improved posture and head carriage. Pt directed in transfer to toilet min A with single step cues. Pt denied having to use restroom however once in sitting had BM, mod A for pericare and clothing management. Pt directed gait to bed, min A with Rolling walker and CGA for sit>supine. Pt left in bed, alarm set, All needs in reach and in good condition. Call light in hand.  Husband present.   Therapy Documentation Precautions:  Precautions Precautions: Fall,Other (comment) Precaution Comments: seizure Restrictions Weight Bearing Restrictions: No General:   Vital Signs: Therapy Vitals Temp: 97.9 F (36.6 C) Pulse Rate: 65 Resp: 14 BP: 111/80 Patient Position (if appropriate): Sitting Oxygen Therapy SpO2: 100 % O2 Device: Room Air Pain:   Mobility:   Locomotion :    Trunk/Postural Assessment :    Balance:   Exercises:   Other Treatments:      Therapy/Group: Individual Therapy  Junie Panning 12/14/2020, 3:35 PM

## 2020-12-14 NOTE — Progress Notes (Signed)
Patient apparently was voiding small amounts incontinently but stopped urinating 1/22-->bladder scan showed volume of 473 and she has required I/O caths since. Will check UA/UCS, discussed timed toileting and addition of urecholine with nurse.

## 2020-12-14 NOTE — Progress Notes (Signed)
Occupational Therapy Session Note  Patient Details  Name: Kristine Francis MRN: [id number] Date of Birth: 1940/12/07  Today's Date: 12/14/2020 OT Individual Time: 4008-6761 OT Individual Time Calculation (min): 57 min   Short Term Goals: Week 1:  OT Short Term Goal 1 (Week 1): LTG = STG 2/2 ELOS  Skilled Therapeutic Interventions/Progress Updates:    Pt greeted in bed with no c/o pain. Spouse David at bedside. Pt agreeable to start session by using the restroom. Min A for supine<sit with HOB elevated. Min A for stand pivot<w/c using the RW with vcs. Min A for stand pivot<elevated toilet using the toilet bar. Pt had continent BM, assistance for hygiene and clothing mgt. Once she returned to the w/c, pt proceeded with bathing/dressing tasks sit<stand at the sink. Vcs required for sequencing, note that when pt conversed with therapist she asked the same questions more than once and would forget that she had already completed a task and would repeat it twice (I.e. brushing her hair). CGA for sit<stands and Min A for dynamic standing balance. Pt able to use figure 4 position to wash her feet, donned footwear with some assistance with the Lt foot. Oral care completed with Min A. Pt remained in the w/c at close of session, all needs within reach and safety belt fastened. Tx focus placed on ADL retraining, functional transfers, functional cognition, and sitting/standing balance.   Therapy Documentation Precautions:  Precautions Precautions: Fall,Other (comment) Precaution Comments: seizure Restrictions Weight Bearing Restrictions: No Vital Signs: Therapy Vitals Temp: 97.9 F (36.6 C) Pulse Rate: 65 Resp: 14 BP: 111/80 Patient Position (if appropriate): Sitting Oxygen Therapy SpO2: 100 % O2 Device: Room Air ADL:     Therapy/Group: Individual Therapy  Aloys Hupfer A Briceyda Abdullah 12/14/2020, 2:16 PM

## 2020-12-14 NOTE — Progress Notes (Signed)
[place] PHYSICAL MEDICINE & REHABILITATION PROGRESS NOTE   Subjective/Complaints:  No issues overnight , pt thinks it is Friday  Discussed labwork      ROS-   Pt denies SOB, abd pain, CP, N/V/C/D, and vision changes   Objective:   No results found. Recent Labs    12/14/20 0555  WBC 8.0  HGB 8.4*  HCT 26.6*  PLT 787*   Recent Labs    12/14/20 0555  NA 134*  K 4.0  CL 100  CO2 28  GLUCOSE 93  BUN 11  CREATININE 0.52  CALCIUM 8.3*    Intake/Output Summary (Last 24 hours) at 12/14/2020 0754 Last data filed at 12/14/2020 0330 Gross per 24 hour  Intake 480 ml  Output 1600 ml  Net -1120 ml     Pressure Injury 12/10/20 Sacrum Mid Stage 2 -  Partial thickness loss of dermis presenting as a shallow open injury with a red, pink wound bed without slough. Small circular area at tail bone, center (Active)  12/10/20 1505  Location: Sacrum  Location Orientation: Mid  Staging: Stage 2 -  Partial thickness loss of dermis presenting as a shallow open injury with a red, pink wound bed without slough.  Wound Description (Comments): Small circular area at tail bone, center  Present on Admission: Yes    Physical Exam: Vital Signs Blood pressure 128/78, pulse 68, temperature 97.8 F (36.6 C), resp. rate 15, height 5\' 1"  (1.549 m), weight 40.1 kg, SpO2 97 %.   General: No acute distress Mood and affect are appropriate Heart: Regular rate and rhythm no rubs murmurs or extra sounds Lungs: Clear to auscultation, breathing unlabored, no rales or wheezes Abdomen: Positive bowel sounds, soft nontender to palpation, nondistended Extremities: No clubbing, cyanosis, or edema Skin: No evidence of breakdown, no evidence of rash  Neurologic: Cranial nerves II through XII intact, motor strength is 5/5 in bilateral deltoid, bicep, tricep, grip, hip flexor, knee extensors, ankle dorsiflexor and plantar flexor Sensory exam normal sensation to light touch and proprioception in bilateral  upper and lower extremities Cerebellar exam mild dysmetria BUE  Musculoskeletal: Full range of motion in all 4 extremities. No joint swelling  Assessment/Plan: 1. Functional deficits which require 3+ hours per day of interdisciplinary therapy in a comprehensive inpatient rehab setting.  Physiatrist is providing close team supervision and 24 hour management of active medical problems listed below.  Physiatrist and rehab team continue to assess barriers to discharge/monitor patient progress toward functional and medical goals  Care Tool:  Bathing    Body parts bathed by patient: Right arm,Left arm,Chest,Buttocks,Front perineal area,Abdomen,Face (Declined LB bathing this date)         Bathing assist Assist Level: Moderate Assistance - Patient 50 - 74%     Upper Body Dressing/Undressing Upper body dressing   What is the patient wearing?: Pull over shirt    Upper body assist Assist Level: Minimal Assistance - Patient > 75%    Lower Body Dressing/Undressing Lower body dressing      What is the patient wearing?: Incontinence brief     Lower body assist Assist for lower body dressing: Dependent - Patient 0%     Toileting Toileting    Toileting assist Assist for toileting: Maximal Assistance - Patient 25 - 49%     Transfers Chair/bed transfer  Transfers assist     Chair/bed transfer assist level: Minimal Assistance - Patient > 75% Chair/bed transfer assistive device: Walker,Armrests   Locomotion Ambulation   Ambulation assist  Assist level: Minimal Assistance - Patient > 75% Assistive device: Walker-rolling Max distance: 33ft   Walk 10 feet activity   Assist     Assist level: Minimal Assistance - Patient > 75% Assistive device: Walker-rolling   Walk 50 feet activity   Assist Walk 50 feet with 2 turns activity did not occur: Safety/medical concerns         Walk 150 feet activity   Assist Walk 150 feet activity did not occur:  Safety/medical concerns         Walk 10 feet on uneven surface  activity   Assist Walk 10 feet on uneven surfaces activity did not occur: Safety/medical concerns         Wheelchair     Assist Will patient use wheelchair at discharge?: No Type of Wheelchair: Manual    Wheelchair assist level: Maximal Assistance - Patient 25 - 49% Max wheelchair distance: 75    Wheelchair 50 feet with 2 turns activity    Assist        Assist Level: Maximal Assistance - Patient 25 - 49%   Wheelchair 150 feet activity     Assist  Wheelchair 150 feet activity did not occur: Safety/medical concerns       Blood pressure 128/78, pulse 68, temperature 97.8 F (36.6 C), resp. rate 15, height 5\' 1"  (1.549 m), weight 40.1 kg, SpO2 97 %.  Medical Problem List and Plan: 1.  Debility and hypoxic encephalopathy  - moderate to severe secondary to tonic clonic seizure, amyloid angiopathy, pneumonia and new onset Afib with RVR             -patient may  shower             -ELOS/Goals: 12-14 days- min A to mod I 2.  Antithrombotics: -DVT/anticoagulation:  Pharmaceutical: Other (comment)--on Eliquis- also for Afib with RVR             -antiplatelet therapy: N/a 3. Pain Management: N/A 4. Mood: LCSW to follow for evaluation and support.              -antipsychotic agents: N/A 5. Neuropsych: This patient is not fully capable of making decisions on her own behalf.  1/23- very poor memory- con't regimen 6. Skin/Wound Care: Routine pressure relief measures.  7. Fluids/Electrolytes/Nutrition: Monitor I/O. Check lytes in am.  8. PAF: New onset--continue Metoprolol and eliquis BID. Vitals:   12/13/20 1941 12/14/20 0335  BP: 134/85 128/78  Pulse: 80 68  Resp: 16 15  Temp: 98.5 F (36.9 C) 97.8 F (36.6 C)  SpO2: 98% 97%   Rate controlled  9. Dysphagia: On D2, honey liquids. Monitor hydration with check of lytes in am and next week.discussed normal progression to thin liquids  1/22-  tolerating breakfast- con't regimen and recheck Monday  1/23- pt asking for coffee- will try to arrange thickened coffee for pt  10.  Hyponateremia: Will recheck Na in am--question dilutional  1/23- Na 132- will recheck labs next week, esp since on thickened liquids.   11. Acute on chronic Iron deficiency anemia:H/H down from 11-->8.7. Check stool guaiacs. Monitor for signs of bleeding. Will add iron supplement. Stools heme + monitor hgb on Eliquis - add protonix - GI f/u as OP  12. Leukocytosis:due to PNA  Resolved 13. Seizure prophylaxis: Stable on Keppra. Tolerating without SE.     LOS: 4 days A FACE TO FACE EVALUATION WAS PERFORMED  Charlett Blake 12/14/2020, 7:54 AM

## 2020-12-14 NOTE — Progress Notes (Signed)
Speech Language Pathology Daily Session Note  Patient Details  Name: Kristine Francis MRN: [id number] Date of Birth: 1940/12/27  Today's Date: 12/14/2020 SLP Individual Time: 1100-1156 SLP Individual Time Calculation (min): 56 min  Short Term Goals: Week 1: SLP Short Term Goal 1 (Week 1): Pt will consume trials of dysphagia 3 textures with efficient mastication and oral clearance and Min s/sx aspiration across 2 sessions prior to upgrade. SLP Short Term Goal 2 (Week 1): Pt will perform pharyngeal strengtheing and glottal closure exercises with Min A verbal/visual cues. SLP Short Term Goal 3 (Week 1): Pt will demonstrate ability to problem solve mildly complex functional situations with Min A verbal/visual cues. SLP Short Term Goal 4 (Week 1): Pt will recall new and/or daily functional information with Mod A cues for use of aids or strategies. SLP Short Term Goal 5 (Week 1): Pt will identify 3 current deficits (some physical, some ST related - speech, swallow, cognition) with Mod A cues. SLP Short Term Goal 6 (Week 1): Pt will use compensatory strategies for speech intelligibility to achieved 95% intelligibility in conversatoin with Supervision A cues.  Skilled Therapeutic Interventions: Pt was seen for skilled ST targeting swallowing and cognitive goals. Pt's husband present for session. Cognitive interventions focused primarily on pt's short term memory and compensatory strategies. Max A verbal and visual cues required for recall and functional use of memory notebook, and to recall things even within session to record within it. Also assisted pt in creating more external aids to assist with recall and carryover of swallowing strategies and exercises that are important pieces of new information to this hospitalization. Despite in depth explanation of swallowing deficits, aspiration, and aspiration pneumonia risk, pt still with decreased awareness of the impact her swallowing will have on her at home,  as she still stated "well I can have coffee at home" when talking about discharge. Therefore, Total A required for anticipatory awareness related to swallowing. Pt performed Masako and CTAR swallowing exercises X5 each with extra time and Min A cueing for accuracy. During oral care at the sink, a prolonged cough episode noted after pt swished thin H2O in mouth to rinse out toothpaste. Discussed recommendation to use suction toothbrush for now. After pt recovered from coughing episode, she also consumed trials of ice chips, with throat clearing noted in 4/5 opportunities. Pt left sitting in chair with seatbelt alarm in place and on, needs within reach, husband still present. Continue per current plan of care.        Pain Pain Assessment Pain Scale: 0-10 Pain Score: 0-No pain  Therapy/Group: Individual Therapy  Kristine Francis 12/14/2020, 7:21 AM

## 2020-12-15 ENCOUNTER — Ambulatory Visit: Payer: Medicare Other | Admitting: Podiatry

## 2020-12-15 ENCOUNTER — Inpatient Hospital Stay (HOSPITAL_COMMUNITY): Payer: Medicare Other | Admitting: Physical Therapy

## 2020-12-15 ENCOUNTER — Inpatient Hospital Stay (HOSPITAL_COMMUNITY): Payer: Medicare Other | Admitting: Occupational Therapy

## 2020-12-15 LAB — OCCULT BLOOD X 1 CARD TO LAB, STOOL: Fecal Occult Bld: NEGATIVE

## 2020-12-15 NOTE — Progress Notes (Signed)
Physical Therapy Session Note  Patient Details  Name: Kristine Francis MRN: [id number] Date of Birth: [birth year]  Today's Date: 12/15/2020 PT Individual Time: 0907-1004 PT Individual Time Calculation (min): 57 min   Short Term Goals: Week 1:  PT Short Term Goal 1 (Week 1): pt to demonstrate supine<>sit CGA consistently PT Short Term Goal 2 (Week 1): pt to demonstrate functional transfers with [facility]AD at [facility] PT Short Term Goal 3 (Week 1): pt to demonstrate ambulation with RW for 50' min A PT Short Term Goal 4 (Week 1): pt to demonstrate dynamic standing balance at min A consistently  Skilled Therapeutic Interventions/Progress Updates:    Pt received in supported long sitting in bed with husband sitting by her side. Pt agreeable to therapy session. Transitioned to sitting EOB with close supervision - continues to demo trunk ataxia with increased anterior/posterior and lateral postural sway but able to use UEs to maintain balance with close supervision. Pt unaware that she was not wearing pants but with question cuing able to orient herself to this and verbally tell the therapist where her pants are located in room. Sitting EOB with close supervision for trunk control donned pants and socks with set-up assist and question cuing as pt only threading R LE forgetting L until questioned - donned shoes max assist for time management. Sit>stand EOB>RW with min assist for balance and cuing for safe UE placement using AD - standing with intermittent UE support on AD and min assist for balance while pulling up pants, requires increased time and appears to have difficulty telling when she has grabbed her pants with possible impaired sensation in UE. Gait ~21ft in/out bathroom using RW with constant min assist for AD management and balance - demos wide based gait with decreased  BLE step lengths and foot clearances with continued excessive thoracic kyphosis causing increased anterior lean/weight shift. Standing with  intermittent UE support on RW while completing LB clothing management as described above - continent of small BM and pt states "2 drops" from bladder. Standing at sink completed hand hygiene with min assist for balance - demos improved upright posture with decreased compensation of leaning forward on sink with forearms for balance compared to previous time completing this activity with this therapist. Gait ~156ft to/from therapy gym using RW with min assist for balance - continues to demo wide base with decreased B LE step lengths and foot clearances. Gait training ~47ft, ~14ft, and ~25ft using only R HHA during session focusing on increased balance challenge with promoting increased upright posture as pt tends to have excessive trunk flexion and anterior weight shift when using RW as opposed to leading with her pelvis while ambulating. Dynamic standing balance task with HHA to reach slightly outside BOS across body to place clothespins on basketball while also addressing B UE coordination with pt having difficulty manipulating clothespins in her hands. At end of session pt left supine in bed upon request of NT to complete I&O cath - pt left with needs in reach, bed alarm on, and her husband present.  Therapy Documentation Precautions:  Precautions Precautions: Fall,Other (comment) Precaution Comments: seizure Restrictions Weight Bearing Restrictions: No  Pain:   No reports of pain throughout session.   Therapy/Group: Individual Therapy  Tawana Scale , PT, DPT, CSRS  12/15/2020, 7:58 AM

## 2020-12-15 NOTE — Progress Notes (Signed)
Per therapy, pt void unsuccessful. Will continue toileting schedule and intermittent caths as ordered. Sheela Stack, LPN

## 2020-12-15 NOTE — Progress Notes (Signed)
[place] PHYSICAL MEDICINE & REHABILITATION PROGRESS NOTE   Subjective/Complaints: Requiring I/O caths, has trazodone and benadryl on med list but not taking these UA looks neg thus far Cx pnd  Pt doews not recall being cathed, also does not recall seeing me yesterday    ROS-   Pt denies SOB, abd pain, CP, N/V/C/D, and vision changes   Objective:   No results found. Recent Labs    12/14/20 0555  WBC 8.0  HGB 8.4*  HCT 26.6*  PLT 787*   Recent Labs    12/14/20 0555  NA 134*  K 4.0  CL 100  CO2 28  GLUCOSE 93  BUN 11  CREATININE 0.52  CALCIUM 8.3*    Intake/Output Summary (Last 24 hours) at 12/15/2020 0754 Last data filed at 12/15/2020 0400 Gross per 24 hour  Intake 480 ml  Output 1300 ml  Net -820 ml     Pressure Injury 12/10/20 Sacrum Mid Stage 2 -  Partial thickness loss of dermis presenting as a shallow open injury with a red, pink wound bed without slough. Small circular area at tail bone, center (Active)  12/10/20 1505  Location: Sacrum  Location Orientation: Mid  Staging: Stage 2 -  Partial thickness loss of dermis presenting as a shallow open injury with a red, pink wound bed without slough.  Wound Description (Comments): Small circular area at tail bone, center  Present on Admission: Yes    Physical Exam: Vital Signs Blood pressure (!) 152/77, pulse 68, temperature (!) 97.4 F (36.3 C), temperature source Oral, resp. rate 15, height 5\' 1"  (1.549 m), weight 39.9 kg, SpO2 99 %.   General: No acute distress Mood and affect are appropriate Heart: Regular rate and rhythm no rubs murmurs or extra sounds Lungs: Clear to auscultation, breathing unlabored, no rales or wheezes Abdomen: Positive bowel sounds, soft nontender to palpation, nondistended Extremities: No clubbing, cyanosis, or edema Skin: No evidence of breakdown, no evidence of rash  Neurologic: Cranial nerves II through XII intact, motor strength is 5/5 in bilateral deltoid, bicep, tricep,  grip, hip flexor, knee extensors, ankle dorsiflexor and plantar flexor Sensory exam normal sensation to light touch and proprioception in bilateral upper and lower extremities Cerebellar exam mild dysmetria BUE  Musculoskeletal: Full range of motion in all 4 extremities. No joint swelling  Assessment/Plan: 1. Functional deficits which require 3+ hours per day of interdisciplinary therapy in a comprehensive inpatient rehab setting.  Physiatrist is providing close team supervision and 24 hour management of active medical problems listed below.  Physiatrist and rehab team continue to assess barriers to discharge/monitor patient progress toward functional and medical goals  Care Tool:  Bathing    Body parts bathed by patient: Right arm,Left arm,Chest,Front perineal area,Abdomen,Face,Right upper leg,Left upper leg,Right lower leg,Left lower leg   Body parts bathed by helper: Buttocks     Bathing assist Assist Level: Minimal Assistance - Patient > 75%     Upper Body Dressing/Undressing Upper body dressing   What is the patient wearing?: Pull over shirt    Upper body assist Assist Level: Minimal Assistance - Patient > 75%    Lower Body Dressing/Undressing Lower body dressing      What is the patient wearing?: Incontinence brief,Pants     Lower body assist Assist for lower body dressing: Moderate Assistance - Patient 50 - 74%     Toileting Toileting    Toileting assist Assist for toileting: Maximal Assistance - Patient 25 - 49%  Transfers Chair/bed transfer  Transfers assist     Chair/bed transfer assist level: Minimal Assistance - Patient > 75% Chair/bed transfer assistive device: Walker,Armrests   Locomotion Ambulation   Ambulation assist      Assist level: Minimal Assistance - Patient > 75% Assistive device: Walker-rolling Max distance: 17ft   Walk 10 feet activity   Assist     Assist level: Minimal Assistance - Patient > 75% Assistive device:  Walker-rolling   Walk 50 feet activity   Assist Walk 50 feet with 2 turns activity did not occur: Safety/medical concerns         Walk 150 feet activity   Assist Walk 150 feet activity did not occur: Safety/medical concerns         Walk 10 feet on uneven surface  activity   Assist Walk 10 feet on uneven surfaces activity did not occur: Safety/medical concerns         Wheelchair     Assist Will patient use wheelchair at discharge?: No Type of Wheelchair: Manual    Wheelchair assist level: Maximal Assistance - Patient 25 - 49% Max wheelchair distance: 75    Wheelchair 50 feet with 2 turns activity    Assist        Assist Level: Maximal Assistance - Patient 25 - 49%   Wheelchair 150 feet activity     Assist  Wheelchair 150 feet activity did not occur: Safety/medical concerns       Blood pressure (!) 152/77, pulse 68, temperature (!) 97.4 F (36.3 C), temperature source Oral, resp. rate 15, height 5\' 1"  (1.549 m), weight 39.9 kg, SpO2 99 %.  Medical Problem List and Plan: 1.  Debility and hypoxic encephalopathy  - moderate to severe secondary to tonic clonic seizure, amyloid angiopathy, pneumonia and new onset Afib with RVR             -patient may  shower             -ELOS/Goals: 12-14 days- min A to mod I 2.  Antithrombotics: -DVT/anticoagulation:  Pharmaceutical: Other (comment)--on Eliquis- also for Afib with RVR             -antiplatelet therapy: N/a 3. Pain Management: N/A 4. Mood: LCSW to follow for evaluation and support.              -antipsychotic agents: N/A 5. Neuropsych: This patient is not fully capable of making decisions on her own behalf.  1/23- very poor memory- con't regimen 6. Skin/Wound Care: Routine pressure relief measures.  7. Fluids/Electrolytes/Nutrition: Monitor I/O. Check lytes in am.  8. PAF: New onset--continue Metoprolol and eliquis BID. Vitals:   12/14/20 1940 12/15/20 0345  BP: (!) 148/101 (!) 152/77   Pulse: 79 68  Resp: 20 15  Temp: 98 F (36.7 C) (!) 97.4 F (36.3 C)  SpO2: 100% 99%   Rate controlled but BP up 9. Dysphagia: On D2, honey liquids. Monitor hydration with check of lytes in am and next week.discussed normal progression to thin liquids  1/22- tolerating breakfast- con't regimen and recheck Monday  1/23- pt asking for coffee- will try to arrange thickened coffee for pt  10.  Hyponateremia: Will recheck Na in am--question dilutional  1/23- Na 132- will recheck labs next week, esp since on thickened liquids.   11. Acute on chronic Iron deficiency anemia:H/H down from 11-->8.7. Check stool guaiacs. Monitor for signs of bleeding. Will add iron supplement. Stools heme + monitor hgb on Eliquis - add protonix -  GI f/u as OP  12. Leukocytosis:due to PNA  Resolved 13. Seizure prophylaxis: Stable on Keppra. Tolerating without SE.   14.  Urinary retention added urecholine UA neg   LOS: 5 days A FACE TO FACE EVALUATION WAS PERFORMED  Charlett Blake 12/15/2020, 7:54 AM

## 2020-12-15 NOTE — Progress Notes (Signed)
Occupational Therapy Session Note  Patient Details  Name: Kristine Francis MRN: [id number] Date of Birth: 11-Feb-1941  Today's Date: 12/15/2020 OT Individual Time: 1101-1203 OT Individual Time Calculation (min): 62 min    Short Term Goals: Week 1:  OT Short Term Goal 1 (Week 1): LTG = STG 2/2 ELOS  Skilled Therapeutic Interventions/Progress Updates:    Session 1: (4098-1191) Pt completed supine to sit EOB with min assist to begin session.  She was able to state location as well as reason for hospitalization.  She did however forget when she asked therapist how long he had been working here.  After approximately 5 mins, she asked the same question again.  Next, she ambulated to the sink with min assist for oral hygiene in standing.  Increased trunk flexion noted with increased head tilt to the right.  She then transferred to the wheelchair for transport down to the ADL apartment.  She was able to then work on simulated tub/shower transfers with use of the RW and tub bench at min assist level.  Discussed use of a hand held shower to benefit for rinsing off as well.  She then transferred back to the wheelchair with min assist and was taken down to the therapy gym.  Min assist for transfer over to the therapy mat with the RW as well.  She then worked on [employer] coordination with use of the [employer].  She was able to complete it in 1:48 with the left hand and only 5 pegs in 6 mins using the RUE.  Returned to the wheelchair at end of session with min assist and therapist pushed her back to the room.  She washed her hands with setup in preparation for lunch.  Finished session with education to spouse on need for shower tub bench at discharge as well as a hand held shower.  Call button and phone in reach with safety belt in place and spouse present.    Session 2: (4782-9562) Kristine Francis was up in the wheelchair with her husband present at start of session.  She was rolled down to the therapy gym for  session via wheelchair.  Transfer to the therapy mat was completed at min assist level using the RW.  Pt continues to exhibit wide BOS in standing and with stepping transfers as well as circumduction pattern with decreased knee flexion when taking steps.  Ataxia is also present in her trunk and core.  She was able to work on standing balance during session by having to pick up playing cards from a stool on the left side using the RUE and then match them to the ones velcroed on the back of the mirror.  Min assist was needed for standing balance when reaching down to pick up the cards.  Had her also work on [place] coordination in sitting by picking up small foam pieces one at a time and placing in a cup.  She was able to complete with 95% accuracy.  Next, had her pick up two pieces, one at a time and work on manipulation from her fingertips to her palms and back.  She demonstrated decreased ability to coordination transition from palm to fingertips to place them in the cup.  Next, had her complete short distance functional mobility approximately 32' with min assist and use of the RW.  Increased trunk flexion noted again wide BOS and ataxic gait.  Finished session with return to the wheelchair and transfer back to the room and  to the bed.  Min assist for transfer to the bed with use of the RW.  Supervision for transition to supine from sitting.  Educated pt's spouse on FM coordination activity to be continued with use of the foam pieces when she is in the room.    Therapy Documentation Precautions:  Precautions Precautions: Fall,Other (comment) Precaution Comments: seizure Restrictions Weight Bearing Restrictions: No  Pain: Pain Assessment Pain Scale: Faces Pain Score: 0-No pain ADL: See Care Tool Section for some details of mobility and selfcare  Therapy/Group: Individual Therapy  Solmon Bohr OTR/L 12/15/2020, 12:17 PM

## 2020-12-15 NOTE — Progress Notes (Signed)
Initial Nutrition Assessment  DOCUMENTATION CODES:   Underweight,Severe malnutrition in context of acute illness/injury  INTERVENTION:   - Magic Cup TID with meals, each supplement provides 290 kcal and 9 grams of protein  - Continue MVI with minerals daily  - Continue to encourage adequate PO intake  NUTRITION DIAGNOSIS:   Severe Malnutrition related to acute illness (pneumonia requiring intubation, dysphagia) as evidenced by moderate fat depletion,severe muscle depletion.  GOAL:   Patient will meet greater than or equal to 90% of their needs  MONITOR:   PO intake,Supplement acceptance,Diet advancement,Labs,Weight trends,Skin  REASON FOR ASSESSMENT:   Other (underweight BMI)    ASSESSMENT:   80 year old female with PMH of HTN, asthma, hepatic steatosis, vestibulitis, CVA who was admitted on 11/25/20 with right-sided weakness, right gaze preference, and difficulty speaking. In the ED pt had difficulty handling oral secretions and had tonic clonic seizure and loss of pulse. Pt required intubation for airway protection. MRI brain done revealing multiple foci of susceptibility in bilateral cerebral hemispheres more pronounced in left occipital region question chronic microhemorrhages. Pt was extubated to 4 L oxygen by 1/8. Admitted to CIR on 1/21.   Spoke with pt and husband at bedside. Pt reports good appetite. Noted ~90% completed lunch meal tray at bedside. Pt reports that she has been eating at least this much at all previous meals. Pt's husband confirms this. Meal completions have been 30-95% for the last 8 documented meals.  Pt reports that her PO intake at home resembles her PO intake during admission. She has not noticed any recent changes in her appetite or PO intake.  Pt reports that she has always been small. She states that her UBW is 95-100 lbs. Reviewed available weight history in chart. Current weight of 39.9 kg (87.96 lbs) is consistent with weights from August 2021.  However, CIR admission weight was 42.5 kg. Pt has experienced a 2.6 kg weight loss since admission to CIR. This is a 6.1% weight loss in less than 1 week which, if accurate, is significant for timeframe. RD will continue to monitor trends.  Pt states that he likes the Magic Cup supplements but that she does get full from her meal so often does not consume all of them.  Meal Completion: 30-95% x last 8 meals  Medications reviewed and include: MVI with minerals, protonix  Labs reviewed: sodium 134, hemoglobin 8.4  UOP: 1300 ml x 24 hours  NUTRITION - FOCUSED PHYSICAL EXAM:  Flowsheet Row Most Recent Value  Orbital Region Moderate depletion  Upper Arm Region Moderate depletion  Thoracic and Lumbar Region Moderate depletion  Buccal Region Severe depletion  Temple Region Moderate depletion  Clavicle Bone Region Severe depletion  Clavicle and Acromion Bone Region Severe depletion  Scapular Bone Region Moderate depletion  Dorsal Hand Moderate depletion  Patellar Region Moderate depletion  Anterior Thigh Region Moderate depletion  Posterior Calf Region Moderate depletion  Edema (RD Assessment) None  Hair Reviewed  Eyes Reviewed  Mouth Reviewed  Skin Reviewed  Nails Reviewed       Diet Order:   Diet Order            DIET DYS 2 Room service appropriate? No; Fluid consistency: Honey Thick  Diet effective now                 EDUCATION NEEDS:   Education needs have been addressed  Skin:  Skin Assessment: Skin Integrity Issues: Stage II: sacrum  Last BM:  12/14/20  Height:  Ht Readings from Last 1 Encounters:  12/10/20 5\' 1"  (1.549 m)    Weight:   Wt Readings from Last 1 Encounters:  12/15/20 39.9 kg    BMI:  Body mass index is 16.62 kg/m.  Estimated Nutritional Needs:   Kcal:  1500-1700  Protein:  70-85 grams  Fluid:  1.5-1.7 L    Gustavus Bryant, MS, RD, LDN Inpatient Clinical Dietitian Please see AMi[facility] for contact information.

## 2020-12-16 ENCOUNTER — Inpatient Hospital Stay (HOSPITAL_COMMUNITY): Payer: Medicare Other | Admitting: Physical Therapy

## 2020-12-16 ENCOUNTER — Inpatient Hospital Stay (HOSPITAL_COMMUNITY): Payer: Medicare Other | Admitting: Occupational Therapy

## 2020-12-16 DIAGNOSIS — E43 Unspecified severe protein-calorie malnutrition: Secondary | ICD-10-CM | POA: Insufficient documentation

## 2020-12-16 LAB — URINE CULTURE: Culture: NO GROWTH

## 2020-12-16 LAB — PREALBUMIN: Prealbumin: 15.4 mg/dL — ABNORMAL LOW (ref 18–38)

## 2020-12-16 MED ORDER — ENSURE MAX PROTEIN PO LIQD
11.0000 [oz_av] | Freq: Every day | ORAL | Status: DC
  Administered 2020-12-16 – 2020-12-24 (×9): 11 [oz_av] via ORAL
  Filled 2020-12-16: qty 330

## 2020-12-16 NOTE — Progress Notes (Signed)
Occupational Therapy Session Note  Patient Details  Name: Kristine Francis MRN: [id number] Date of Birth: [birth year]  Today's Date: 12/16/2020 OT Individual Time: 1325-1425 OT Individual Time Calculation (min): 60 min    Short Term Goals: Week 1:  OT Short Term Goal 1 (Week 1): LTG = STG 2/2 ELOS  Skilled Therapeutic Interventions/Progress Updates:    Pt supine in bed, husband present at beginning part of session.  Pt has no c/o pain and agreeable to working with OT. Pt oriented to person, place, and time, but unable to recall purpose.  Pt completed supine to sit with CGA and donned socks and shoes sitting EOB.  Pt required supervision to donn socks and min assist to donn shoes including stabilizing back of shoe while pt pushed heels in and tying laces.  Instructed pt through tying shoe, placed in lap, to promote improved fine motor control. Noted pt able to complete first few steps of tying shoe, however she had difficulty with pushing loop through, therefore therapist provided forward chaining of task. Assessed sensation in bilateral hands and noted diminished sensation to light touch in both thumbs which may be limiting her during fine motor tasks like tying shoes (as well as impaired coordination); intact light touch sensation in all other digits.    CGA needed for stand pivot transfer EOB to w/c using RW and step by step VCs for RW management and safe hand placement.  Pt transported to Ortho gym and completed x 8 minutes on UBE at resistance level 1.5 to facilitate increased BUE strength and activity tolerance/endurance.  Pt then participated in various dynamic standing balance activities with cognitive and visual scanning component at [facility] system.  Pt required unilateral support from RW when standing to maintain balance with CGA.  Pt required VCs for safe hand and RW placement during sit<>stand to/from w/c each standing interval.  Pt was able to perform 5 word sequence immediate recall  consistently with occasional VCs only.  Immediate recall seems intact, however, short term memory of approximately 2-3 minute duration appears impaired evidenced by pt asking a question repetitively despite having already asked and discussed topic with therapist a few minutes prior.    Pt returned to room, seated at w/c, call bell in reach, seat belt alarm on.  Therapy Documentation Precautions:  Precautions Precautions: Fall,Other (comment) Precaution Comments: seizure Restrictions Weight Bearing Restrictions: No   Therapy/Group: Individual Therapy  Ezekiel Slocumb 12/16/2020, 2:28 PM

## 2020-12-16 NOTE — Progress Notes (Signed)
Physical Therapy Session Note  Patient Details  Name: Kristine Francis MRN: [id number] Date of Birth: Jun 07, 1941  Today's Date: 12/16/2020 PT Individual Time: 7035-0093 PT Individual Time Calculation (min): 40 min   and  Today's Date: 12/16/2020 PT Missed Time: 30 Minutes Missed Time Reason: Other (Comment) (meal)  Short Term Goals: Week 1:  PT Short Term Goal 1 (Week 1): pt to demonstrate supine<>sit CGA consistently PT Short Term Goal 2 (Week 1): pt to demonstrate functional transfers with LRAD at [facility] PT Short Term Goal 3 (Week 1): pt to demonstrate ambulation with RW for 50' min A PT Short Term Goal 4 (Week 1): pt to demonstrate dynamic standing balance at min A consistently  Skilled Therapeutic Interventions/Progress Updates:    Session 1: Pt received in bed supported in upright sitting with NT present providing supervision for breakfast. Pt declines transferring to EOB to eat meal and politely requests additional time to finish meal prior to therapy session. Left in care of NT. Missed 30 minutes of skilled physical therapy.  Session 2: Pt received sitting in w/c and agreeable to therapy session. Sit>stand w/c>RW with CGA/min assist for steadying/balance - pt with a delay in coming to full upright stand as she has to decrease postural sway prior to moving hands from arm rest to RW. Gait training ~127ft using RW with CGA/min assist for steadying - demos improved upright posture not pushing AD too far forward although continues to demo wide BOS and decreased B LE hip/knee flexion during swing (L more impaired) causing more of a stiff leg walking pattern. Gait training ~42ft using R HHA with mod assist for balance working on dynamic balance challenge - continues to demo wide BOS with impaired B LE coordination lacking hip/knee flexion in swing with increased postural sway and path deviation. Repeated sit<>stands to/from EOM with B HHA and min/mod assist for balance due to posterior lean upon  coming to stand followed by 30second static standing balance - continues to have moderate anterior/posterior postural sway in static stance with primary posterior LOB requiring mod/max assist to maintain upright. Dynamic standing balance with R HHA via repeated L LE foot taps on/off 4" step then repeated R LE foot taps progressed to alternate foot taps - mod assist for balance with pt demoing impaired L LE coordination with decreased hip/knee flexion compared to R. Educated pt on recommendation that she use RW at home upon D/C to increase balance safety (due to memory impairments will require repeated education). Sit<>supine on mat table with supervision for safety. Supine bridging 2x10 reps with ball squeeze for increased hip adductor activation - demos poor control with knees veering R requiring cuing for correction and increased glute activation. Gait training ~179ft back to room using R HHA with mod assist for balance - continues to demo above gait impairments with increased postural sway when turning with excessive anterior trunk lean requiring cuing for correction. Pt left seated in w/c with needs in reach, seat belt alarm on, and NT present to assist with meal.  Therapy Documentation Precautions:  Precautions Precautions: Fall,Other (comment) Precaution Comments: seizure Restrictions Weight Bearing Restrictions: No  Pain:  Session 2: Denies pain during session.   Therapy/Group: Individual Therapy  Tawana Scale , PT, DPT, CSRS  12/16/2020, 7:42 AM

## 2020-12-16 NOTE — Progress Notes (Addendum)
[facility] PHYSICAL MEDICINE & REHABILITATION PROGRESS NOTE   Subjective/Complaints: Starting to void Discussed low protein levels , pt states she is not "much of a meat eater"  Will eat eggs and beans    ROS-   Pt denies SOB, abd pain, CP, N/V/D   Objective:   No results found. Recent Labs    12/14/20 0555  WBC 8.0  HGB 8.4*  HCT 26.6*  PLT 787*   Recent Labs    12/14/20 0555  NA 134*  K 4.0  CL 100  CO2 28  GLUCOSE 93  BUN 11  CREATININE 0.52  CALCIUM 8.3*    Intake/Output Summary (Last 24 hours) at 12/16/2020 0813 Last data filed at 12/15/2020 1806 Gross per 24 hour  Intake 240 ml  Output 517 ml  Net -277 ml     Pressure Injury 12/10/20 Sacrum Mid Stage 2 -  Partial thickness loss of dermis presenting as a shallow open injury with a red, pink wound bed without slough. Small circular area at tail bone, center (Active)  12/10/20 1505  Location: Sacrum  Location Orientation: Mid  Staging: Stage 2 -  Partial thickness loss of dermis presenting as a shallow open injury with a red, pink wound bed without slough.  Wound Description (Comments): Small circular area at tail bone, center  Present on Admission: Yes    Physical Exam: Vital Signs Blood pressure (!) 154/87, pulse 64, temperature 97.7 F (36.5 C), resp. rate 16, height 5\' 1"  (1.549 m), weight 39.9 kg, SpO2 99 %.   General: No acute distress Mood and affect are appropriate Heart: Regular rate and rhythm no rubs murmurs or extra sounds Lungs: Clear to auscultation, breathing unlabored, no rales or wheezes Abdomen: Positive bowel sounds, soft nontender to palpation, nondistended Extremities: No clubbing, cyanosis, or edema Skin: No evidence of breakdown, no evidence of rash  Neurologic: Cranial nerves II through XII intact, motor strength is 5/5 in bilateral deltoid, bicep, tricep, grip, hip flexor, knee extensors, ankle dorsiflexor and plantar flexor Sensory exam normal sensation to light touch and  proprioception in bilateral upper and lower extremities Cerebellar exam mild dysmetria BUE  Musculoskeletal: Full range of motion in all 4 extremities. No joint swelling  Assessment/Plan: 1. Functional deficits which require 3+ hours per day of interdisciplinary therapy in a comprehensive inpatient rehab setting.  Physiatrist is providing close team supervision and 24 hour management of active medical problems listed below.  Physiatrist and rehab team continue to assess barriers to discharge/monitor patient progress toward functional and medical goals  Care Tool:  Bathing    Body parts bathed by patient: Right arm,Left arm,Chest,Front perineal area,Abdomen,Face,Right upper leg,Left upper leg,Right lower leg,Left lower leg   Body parts bathed by helper: Buttocks     Bathing assist Assist Level: Minimal Assistance - Patient > 75%     Upper Body Dressing/Undressing Upper body dressing   What is the patient wearing?: Pull over shirt    Upper body assist Assist Level: Minimal Assistance - Patient > 75%    Lower Body Dressing/Undressing Lower body dressing      What is the patient wearing?: Incontinence brief,Pants     Lower body assist Assist for lower body dressing: Moderate Assistance - Patient 50 - 74%     Toileting Toileting    Toileting assist Assist for toileting: Maximal Assistance - Patient 25 - 49%     Transfers Chair/bed transfer  Transfers assist     Chair/bed transfer assist level: Minimal Assistance -  Patient > 75% Chair/bed transfer assistive device: Museum/gallery exhibitions officer assist      Assist level: Minimal Assistance - Patient > 75% Assistive device: Walker-rolling Max distance: 118ft   Walk 10 feet activity   Assist     Assist level: Minimal Assistance - Patient > 75% Assistive device: Walker-rolling   Walk 50 feet activity   Assist Walk 50 feet with 2 turns activity did not occur: Safety/medical  concerns  Assist level: Minimal Assistance - Patient > 75% Assistive device: Walker-rolling    Walk 150 feet activity   Assist Walk 150 feet activity did not occur: Safety/medical concerns  Assist level: Minimal Assistance - Patient > 75% Assistive device: Walker-rolling    Walk 10 feet on uneven surface  activity   Assist Walk 10 feet on uneven surfaces activity did not occur: Safety/medical concerns         Wheelchair     Assist Will patient use wheelchair at discharge?: No Type of Wheelchair: Manual    Wheelchair assist level: Maximal Assistance - Patient 25 - 49% Max wheelchair distance: 75    Wheelchair 50 feet with 2 turns activity    Assist        Assist Level: Maximal Assistance - Patient 25 - 49%   Wheelchair 150 feet activity     Assist  Wheelchair 150 feet activity did not occur: Safety/medical concerns       Blood pressure (!) 154/87, pulse 64, temperature 97.7 F (36.5 C), resp. rate 16, height 5\' 1"  (1.549 m), weight 39.9 kg, SpO2 99 %.  Medical Problem List and Plan: 1. hypoxic encephalopathy  - moderate to severe secondary to tonic clonic seizure, amyloid angiopathy,              -patient may  shower             -ELOS/Goals: 12-14 days- min A to mod I 2.  Antithrombotics: -DVT/anticoagulation:  Pharmaceutical: Other (comment)--on Eliquis- also for Afib with RVR             -antiplatelet therapy: N/a 3. Pain Management: N/A 4. Mood: LCSW to follow for evaluation and support.              -antipsychotic agents: N/A 5. Neuropsych: This patient is not fully capable of making decisions on her own behalf.  1/23- very poor memory- con't regimen 6. Skin/Wound Care: Routine pressure relief measures.  7. Fluids/Electrolytes/Nutrition: Monitor I/O. Check lytes in am.  8. PAF: New onset--continue Metoprolol and eliquis BID. Vitals:   12/15/20 1952 12/16/20 0457  BP: (!) 143/84 (!) 154/87  Pulse: 78 64  Resp: 18 16  Temp: 98.1 F  (36.7 C) 97.7 F (36.5 C)  SpO2: 98% 99%   Rate controlled but BP up 9. Dysphagia: On D2, honey liquids. Monitor hydration with check of lytes in am and next week.discussed normal progression to thin liquids  1/22- tolerating breakfast- con't regimen and recheck Monday  1/23- pt asking for coffee- will try to arrange thickened coffee for pt  10.  Hyponateremia: Will recheck Na in am--question dilutional  1/23- Na 132- will recheck labs next week, esp since on thickened liquids.   11. Acute on chronic Iron deficiency anemia:H/H down from 11-->8.7. Check stool guaiacs. Monitor for signs of bleeding. Will add iron supplement. Stools heme + monitor hgb on Eliquis - add protonix - GI f/u as OP  12. Leukocytosis:due to PNA  Resolved 13. Seizure prophylaxis: Stable on  Keppra. Tolerating without SE.   14.  Urinary retention added urecholine UA neg - improved  15.  Hypoalbuminemia- will add Prostat- enc hi protein food sources LOS: 6 days A FACE TO FACE EVALUATION WAS PERFORMED  Charlett Blake 12/16/2020, 8:13 AM

## 2020-12-16 NOTE — Progress Notes (Signed)
Patient ID: Kristine Francis, female   DOB: 1941/11/01, 80 y.o.   MRN: [id number] Team Conference Report to Patient/Family  Team Conference discussion was reviewed with the patient and caregiver, including goals, any changes in plan of care and target discharge date.  Patient and caregiver express understanding and are in agreement.  The patient has a target discharge date of 12/24/20.  Dyanne Iha 12/16/2020, 1:15 PM

## 2020-12-16 NOTE — Patient Care Conference (Signed)
Inpatient RehabilitationTeam Conference and Plan of Care Update Date: 12/16/2020   Time: 10:41 AM    Patient Name: Kristine Francis      Medical Record Number: [id number]  Date of Birth: June 23, 1941 Sex: Female         Room/Bed: 4M08C/4M08C-01 Payor Info: Payor: MEDICARE / Plan: MEDICARE PART A AND B / Product Type: *No Product type* /    Admit Date/Time:  12/10/2020  2:23 PM  Primary Diagnosis:  Encephalopathy  Hospital Problems: Principal Problem:   Encephalopathy Active Problems:   Pressure injury of skin   Protein-calorie malnutrition, severe    Expected Discharge Date: Expected Discharge Date: 12/24/20  Team Members Present: Physician leading conference: Dr. [name] Care Coodinator Present: Dorien Chihuahua, RN, BSN, CRRN;Christina Sampson Goon, [place] Nurse Present: Frances Maywood, RN PT Present: Page Spiro, PT OT Present: Clyda Greener, OT SLP Present: Jettie Booze, CF-SLP PPS Coordinator present : Gunnar Fusi, SLP     Current Status/Progress Goal Weekly Team Focus  Bowel/Bladder             Swallow/Nutrition/ Hydration   dys 2 textures, honey thick liquids, Min-Mod use of compensatory swallow strategies (due to memory deficits)  Supervision  pharyngeal strengthing exercises, trials advanced textures, preparation for repeat MBSS   ADL's   Pt currently completes UB selfcare at supervision level and LB bathing at min assist.  LB dressing min to mod with transfers at min assist as well.  Ataxia noted in her trunk and pelvis in standing and with mobility.  Decreased RUE coordination as well, currently using it at a diminshed level. Decreased short term memory.  supervision to modified independent  selfcare retraining, transfer training, balance retraining, cognitive retraining, DME education, neuromuscular re-education, pt/family education, therapeutic activites   Mobility   min assist bed mobility, min assist sit<>stand and stand pivot transfers using RW, min assist  gait up to 165ft using RW, requires min to max assist for standing balance without UE support due to significant ataxia with impaired motor control and coordination  supervision to CGA at ambulatory level  activity tolerance, static and dynamic standing balance, gait training, pt/family education, transfer training, coordination, dynamic sitting balance   Communication   Mild dysarthria, Supervision A  Mod I  intelligibility strategies during functional and cognitively demanding tasks   Safety/Cognition/ Behavioral Observations  Max A memory, Mod A awareness, Min-Mod functional problem solving  Supervision  functional recall and carryover of new information/use of compensatory strategies, intellectual and emergent awareness, functional problem solving   Pain             Skin               Discharge Planning:  Discharging home with spouse and son able to provide 24/7 at MOD A level   Team Discussion: Post seizure encephalopathy. Low albumin level, encourage increased protein intake and added supplement. Progress impaired by tremors, ataxia, poor motor control and patient's reliance on adaptive equipment with intermittent confusion Patient on target to meet rehab goals: yes, currently supervision for upper body care and min assist for lower body care with supervision to mod I goals set  *See Care Plan and progress notes for long and short-term goals.   Revisions to Treatment Plan:  Address awareness issues, memory, cues for strategies with eating Teaching Needs: Cues, toileting, transfers, medications, diet, etc.   Current Barriers to Discharge: Decreased caregiver support  Possible Resolutions to Barriers: Family education     Medical Summary Current Status:  ataxia, cognitive deficits, poor nutrition, stage 2 buttocks decub, dysphagia  Barriers to Discharge: Other (comments)  Barriers to Discharge Comments: trunkal ataxia Possible Resolutions to Barriers/Weekly Focus: protein  supplement, recheck MBS   Continued Need for Acute Rehabilitation Level of Care: The patient requires daily medical management by a physician with specialized training in physical medicine and rehabilitation for the following reasons: Direction of a multidisciplinary physical rehabilitation program to maximize functional independence : Yes Medical management of patient stability for increased activity during participation in an intensive rehabilitation regime.: Yes Analysis of laboratory values and/or radiology reports with any subsequent need for medication adjustment and/or medical intervention. : Yes   I attest that I was present, lead the team conference, and concur with the assessment and plan of the team.   Dorien Chihuahua B 12/16/2020, 4:05 PM

## 2020-12-16 NOTE — Progress Notes (Signed)
Occupational Therapy Session Note  Patient Details  Name: Kristine Francis MRN: [id number] Date of Birth: 1941-06-22  Today's Date: 12/16/2020 OT Individual Time: 9381-0175 OT Individual Time Calculation (min): 69 min    Short Term Goals: Week 1:  OT Short Term Goal 1 (Week 1): LTG = STG 2/2 ELOS  Skilled Therapeutic Interventions/Progress Updates:    Pt completed bathing and dressing sit to stand at the sink during session.  She was able to transfer from supine to sit EOB with supervision.  Mod instructional cueing for scooting to the EOB and for hand placement to push up from the EOB to stand.  Min assist was needed for stand pivot transfer to the wheelchair with use of the RW for support.  She was able to complete all UB bathing with supervision as well as doffing and donning a pullover shirt.  She then completed LB undressing, bathing, and re-dressing at min assist sit to stand with mod instructional cueing for sequencing.  She continued to ask therapist the same questions over and over throughout session secondary to decreased memory.  Spouse present throughout session and supportive.  Finished with work on oral hygiene in standing with min assist.  Increased trunk flexion noted in standing with therapist assist to come back to upright posture.  She was left sitting in the wheelchair with the call button and phone in reach, spouse present, and safety belt in place.    Therapy Documentation Precautions:  Precautions Precautions: Fall,Other (comment) Precaution Comments: seizure Restrictions Weight Bearing Restrictions: No  Pain: Pain Assessment Pain Scale: Faces Pain Score: 0-No pain ADL: See Care Tool Section for some details of mobility and selfcare  Therapy/Group: Individual Therapy  Evalette Montrose OTR/L 12/16/2020, 10:53 AM

## 2020-12-17 ENCOUNTER — Inpatient Hospital Stay (HOSPITAL_COMMUNITY): Payer: Medicare Other | Admitting: Occupational Therapy

## 2020-12-17 ENCOUNTER — Inpatient Hospital Stay (HOSPITAL_COMMUNITY): Payer: Medicare Other | Admitting: Speech Pathology

## 2020-12-17 ENCOUNTER — Inpatient Hospital Stay (HOSPITAL_COMMUNITY): Payer: Medicare Other | Admitting: Physical Therapy

## 2020-12-17 MED ORDER — LEVETIRACETAM 100 MG/ML PO SOLN
500.0000 mg | Freq: Two times a day (BID) | ORAL | Status: DC
  Administered 2020-12-17 – 2020-12-24 (×14): 500 mg via ORAL
  Filled 2020-12-17 (×14): qty 5

## 2020-12-17 NOTE — Progress Notes (Signed)
Physical Therapy Weekly Progress Note  Patient Details  Name: Kristine Francis MRN: [id number] Date of Birth: 09-11-1941  Beginning of progress report period: December 11, 2020 End of progress report period: December 17, 2020  Today's Date: 12/17/2020 PT Individual Time: 0254-2706  And 1135-1208 PT Individual Time Calculation (min): 24 min and 33 min   Patient has met 2 of 4 short term goals.  [name] is progressing well with therapy demonstrating increasing independence with functional mobility. She is performing supine<>sit with supervision, sit<>stand and stand pivot transfers using RW with min assist progressing to CGA, and ambulating up to 12f using RW with min assist. She continues to demonstrate significant motor control/motor coordination impairments with significant B LE (L>R) and truncal ataxia resulting in large postural sways in standing requiring mod/max assist for balance without UE support.   Patient continues to demonstrate the following deficits muscle weakness, decreased cardiorespiratoy endurance, impaired timing and sequencing, unbalanced muscle activation, motor apraxia, ataxia, decreased coordination and decreased motor planning, decreased midline orientation, decreased attention, decreased awareness, decreased problem solving, decreased memory and delayed processing and decreased sitting balance, decreased standing balance, decreased postural control and decreased balance strategies and therefore will continue to benefit from skilled PT intervention to increase functional independence with mobility.  Patient progressing toward long term goals..  Continue plan of care.  PT Short Term Goals Week 1:  PT Short Term Goal 1 (Week 1): pt to demonstrate supine<>sit CGA consistently PT Short Term Goal 1 - Progress (Week 1): Met PT Short Term Goal 2 (Week 1): pt to demonstrate [facility] Short Term Goal 2 - Progress (Week 1): Progressing toward  goal PT Short Term Goal 3 (Week 1): pt to demonstrate ambulation with RW for 50' min A PT Short Term Goal 3 - Progress (Week 1): Met PT Short Term Goal 4 (Week 1): pt to demonstrate dynamic standing balance at min A consistently PT Short Term Goal 4 - Progress (Week 1): Progressing toward goal Week 2:  PT Short Term Goal 1 (Week 2): = to LTGs based on ELOS  Skilled Therapeutic Interventions/Progress Updates:  Ambulation/gait training;Discharge planning;Functional mobility training;Psychosocial support;Therapeutic Activities;Visual/perceptual remediation/compensation;Wheelchair propulsion/positioning;Therapeutic Exercise;Skin care/wound management;Neuromuscular re-education;Balance/vestibular training;Disease management/prevention;Cognitive remediation/compensation;UE/LE Strength taining/ROM;Splinting/orthotics;Pain management;DME/adaptive equipment instruction;Community reintegration;Functional electrical stimulation;Patient/family education;Stair training;UE/LE Coordination activities   Session 1: Pt received supported sitting in bed eating breakfast with NT present for supervision. Pt politely requesting additional time to complete meal prior to therapy session - left in care of NT. Therapist spoke with scheduling to avoid sessions prior to 8:45AM as pt requires increased time for meals.   NT notified therapist once pt finished with meal. Pt received supine in bed and agreeable to session at this time. Pt's husband, DShanon Brow present. Pt requests to get dressed for the day. Long sitting>sitting EOB with supervision and increased time to scoot hips towards EOB. Sitting EOB doffed dirty shirt and donned clean shirt with set-up assist. Continues to demo slight truncal ataxia in sitting though less significant today. Sitting EOB donned pants with significantly increased time - donned socks and shoes max assist for time management. R stand pivot transfer EOB>w/c, no AD, with pt continuing to have delayed  transition from sitting to stand as she requires time to regain balance due to ataxia causing excessive anterior/posterior postural sway - mod assist for balance while turning. Pt left seated in w/c with needs in reach, seat belt alarm on, and her husband present.  Session 2: Therapist returned  to make-up time from AM session and pt received sitting in w/c and agreeable to session. Sit>stand w/c>RW with CGA for steadying and pt demos safe placement of UEs without cuing. Gait training ~121f x2 to/from therapy gym using RW with CGA/min assist for balance - demos slight improvement in B LE hip/knee flexion during swing (typically walks with stiff legged gait), improving upright posture though continues to require cuing to sustain this, not as wide of BOS (working towards mor normal stance width), not pushing AD too far forward. Therapy session focused on sit<>stand transfers to/from chair with decreased UE support targeting balance while avoiding pt pushing backs of legs against chair - can require up to mod/max assist due to posterior LOB (when transitioning to standing and bringing weight forward onto toes to achieve midline has over activation of PFs causing significant posterior sway).  Completes the following standing balance tasks:  - static standing working on decreased postural sway with CGA/min assist - inside BOS B UE arm movements to shoulder height starting straight forward progressing to slight rotation - CGA/min assist  - holding ball with B UEs then performing flexion movements to shoulder height progressed to diagonal movements - when in slight squat can maintain balance with CGA and intermittent min assist but occasional strong posterior LOB requiring mod/max assist for recovery Gait back to room as above. Pt left seated in w/c with needs in reach, seat belt alarm on, and her husband present.  Therapy Documentation Precautions:  Precautions Precautions: Fall,Other (comment) Precaution  Comments: seizure Restrictions Weight Bearing Restrictions: No  Pain:   Session 1: No reports of pain throughout session.  Session 2: No reports of pain throughout session.  Therapy/Group: Individual Therapy  CTawana Scale, PT, DPT, CSRS  12/17/2020, 7:52 AM

## 2020-12-17 NOTE — Progress Notes (Signed)
Speech Language Pathology Daily Session Note  Patient Details  Name: Kristine Francis MRN: [id number] Date of Birth: Feb 11, 1941  Today's Date: 12/17/2020 SLP Individual Time: 1000-1057 SLP Individual Time Calculation (min): 57 min  Short Term Goals: Week 1: SLP Short Term Goal 1 (Week 1): Pt will consume trials of dysphagia 3 textures with efficient mastication and oral clearance and Min s/sx aspiration across 2 sessions prior to upgrade. SLP Short Term Goal 2 (Week 1): Pt will perform pharyngeal strengtheing and glottal closure exercises with Min A verbal/visual cues. SLP Short Term Goal 3 (Week 1): Pt will demonstrate ability to problem solve mildly complex functional situations with Min A verbal/visual cues. SLP Short Term Goal 4 (Week 1): Pt will recall new and/or daily functional information with Mod A cues for use of aids or strategies. SLP Short Term Goal 5 (Week 1): Pt will identify 3 current deficits (some physical, some ST related - speech, swallow, cognition) with Mod A cues. SLP Short Term Goal 6 (Week 1): Pt will use compensatory strategies for speech intelligibility to achieved 95% intelligibility in conversatoin with Supervision A cues.  Skilled Therapeutic Interventions: Pt was seen for skilled ST targeting dysphagia and cognitive goals. Pt's husband present for session. Pt completed oral care at the sink while standing without any coughing today. She then accepted trials of ice chips with 1 subtle throat clear noted (out of 6 piece of ice). Pt declined further trials. Pt also completed a basic medication management task in which she interpreted medicine labels to answer questions and organize a basic pill chart - Mod faded to Imbler A verbal cues were required for problem solving and error awareness. Consistent Mod A verbal cues were required for working memory within tasks, and recall of short term information throughout session. Pt required Mod A verbal cues for recall of purpose and  location of memory notebook as compensatory memory strategy, as well a to use it functionally. Pt left sitting in chair with alarm set and needs within reach. Continue per current plan of care.          Pain Pain Assessment Pain Scale: 0-10 Pain Score: 0-No pain  Therapy/Group: Individual Therapy  Arbutus Leas 12/17/2020, 7:21 AM

## 2020-12-17 NOTE — Progress Notes (Signed)
Patient ID: Kristine Francis, female   DOB: Apr 01, 1941, 80 y.o.   MRN: [id number]   Patient OP referral faxed to [facility].  [place], [place]

## 2020-12-17 NOTE — Progress Notes (Signed)
Occupational Therapy Session Note  Patient Details  Name: Kristine Francis MRN: [id number] Date of Birth: June 16, 1941  Today's Date: 12/17/2020 OT Individual Time: 1093-2355 OT Individual Time Calculation (min): 72 min    Short Term Goals: Week 1:  OT Short Term Goal 1 (Week 1): LTG = STG 2/2 ELOS  Skilled Therapeutic Interventions/Progress Updates:    Pt sitting in her wheelchair to begin session working on finishing her lunch.  Spouse and NT present as well for supervision.  She was taken down to the therapy gym via wheelchair for session.  Had her transfer to the therapy mat with min assist using the RW for support with mod instructional cueing for sequencing and hand placement.  She was able to work on RUE coordination and cognition with use of the colored pegs.  Therapist gave her a diagram with approximately 20 peg design for recreate with correct spacing and color use.  She needed overall mod assist to complete secondary to not being able to match the diagram.  She was able to manipulate the pieces with the LUE without much difficulty however.  Progressed to standing to remove pegs from the grid and place in a box.  Min assist was needed consistently for balance secondary to posterior lean.  She was able to work on a second puzzle in standing, reaching down to the left and right with min assist to pick up pegs as needed.  Mod assist again was needed to match the diagram using approximately 15-20 pegs.  Next, had her complete functional mobility with hand held assist on the right side.  She was able to ambulate with overall mod assist around the gym and back to her wheelchair X2.  She still exhibits some increased ataxia during mobility.  Returned to the room at the end of the session with pt left sitting up in the wheelchair per her request and with the call button and phone in reach.  Safety belt in place and pt's spouse present in the room.   Therapy Documentation Precautions:   Precautions Precautions: Fall,Other (comment) Precaution Comments: seizure Restrictions Weight Bearing Restrictions: No   Pain: Pain Assessment Pain Scale: Faces Pain Score: 0-No pain ADL: See Care Tool Section for some details of mobility and selfcare  Therapy/Group: Individual Therapy  Rondy Krupinski OTR/L 12/17/2020, 4:58 PM

## 2020-12-17 NOTE — Progress Notes (Signed)
[facility] PHYSICAL MEDICINE & REHABILITATION PROGRESS NOTE   Subjective/Complaints: No issues overnight, pt does not recall conversation about protein level yesterday    ROS-   Pt denies SOB, abd pain, CP, N/V/D   Objective:   No results found. No results for input(s): WBC, HGB, HCT, PLT in the last 72 hours. No results for input(s): NA, K, CL, CO2, GLUCOSE, BUN, CREATININE, CALCIUM in the last 72 hours.  Intake/Output Summary (Last 24 hours) at 12/17/2020 0754 Last data filed at 12/17/2020 8588 Gross per 24 hour  Intake 540 ml  Output 299 ml  Net 241 ml     Pressure Injury 12/10/20 Sacrum Mid Stage 2 -  Partial thickness loss of dermis presenting as a shallow open injury with a red, pink wound bed without slough. Small circular area at tail bone, center (Active)  12/10/20 1505  Location: Sacrum  Location Orientation: Mid  Staging: Stage 2 -  Partial thickness loss of dermis presenting as a shallow open injury with a red, pink wound bed without slough.  Wound Description (Comments): Small circular area at tail bone, center  Present on Admission: Yes    Physical Exam: Vital Signs Blood pressure (!) 141/77, pulse 64, temperature 97.8 F (36.6 C), resp. rate 20, height 5\' 1"  (1.549 m), weight 39 kg, SpO2 100 %.    General: No acute distress Mood and affect are appropriate Heart: Regular rate and rhythm no rubs murmurs or extra sounds Lungs: Clear to auscultation, breathing unlabored, no rales or wheezes Abdomen: Positive bowel sounds, soft nontender to palpation, nondistended Extremities: No clubbing, cyanosis, or edema Skin: No evidence of breakdown, no evidence of rash    Neurologic: Cranial nerves II through XII intact, motor strength is 5/5 in bilateral deltoid, bicep, tricep, grip, hip flexor, knee extensors, ankle dorsiflexor and plantar flexor Sensory exam normal sensation to light touch and proprioception in bilateral upper and lower extremities Cerebellar  exam mild dysmetria BUE  Musculoskeletal: Full range of motion in all 4 extremities. No joint swelling  Assessment/Plan: 1. Functional deficits which require 3+ hours per day of interdisciplinary therapy in a comprehensive inpatient rehab setting.  Physiatrist is providing close team supervision and 24 hour management of active medical problems listed below.  Physiatrist and rehab team continue to assess barriers to discharge/monitor patient progress toward functional and medical goals  Care Tool:  Bathing    Body parts bathed by patient: Right arm,Left arm,Chest,Front perineal area,Abdomen,Face,Right upper leg,Left upper leg,Right lower leg,Left lower leg,Buttocks   Body parts bathed by helper: Buttocks     Bathing assist Assist Level: Minimal Assistance - Patient > 75%     Upper Body Dressing/Undressing Upper body dressing   What is the patient wearing?: Pull over shirt    Upper body assist Assist Level: Supervision/Verbal cueing    Lower Body Dressing/Undressing Lower body dressing      What is the patient wearing?: Incontinence brief,Pants     Lower body assist Assist for lower body dressing: Minimal Assistance - Patient > 75%     Toileting Toileting    Toileting assist Assist for toileting: Maximal Assistance - Patient 25 - 49%     Transfers Chair/bed transfer  Transfers assist     Chair/bed transfer assist level: Minimal Assistance - Patient > 75% Chair/bed transfer assistive device: Armrests,Walker   Locomotion Ambulation   Ambulation assist      Assist level: Moderate Assistance - Patient 50 - 74% Assistive device: Hand held assist Max distance: 160ft  Walk 10 feet activity   Assist     Assist level: Moderate Assistance - Patient - 50 - 74% Assistive device: Hand held assist   Walk 50 feet activity   Assist Walk 50 feet with 2 turns activity did not occur: Safety/medical concerns  Assist level: Moderate Assistance - Patient - 50  - 74% Assistive device: Hand held assist    Walk 150 feet activity   Assist Walk 150 feet activity did not occur: Safety/medical concerns  Assist level: Moderate Assistance - Patient - 50 - 74% Assistive device: Hand held assist    Walk 10 feet on uneven surface  activity   Assist Walk 10 feet on uneven surfaces activity did not occur: Safety/medical concerns         Wheelchair     Assist Will patient use wheelchair at discharge?: No Type of Wheelchair: Manual    Wheelchair assist level: Maximal Assistance - Patient 25 - 49% Max wheelchair distance: 75    Wheelchair 50 feet with 2 turns activity    Assist        Assist Level: Maximal Assistance - Patient 25 - 49%   Wheelchair 150 feet activity     Assist  Wheelchair 150 feet activity did not occur: Safety/medical concerns       Blood pressure (!) 141/77, pulse 64, temperature 97.8 F (36.6 C), resp. rate 20, height 5\' 1"  (1.549 m), weight 39 kg, SpO2 100 %.  Medical Problem List and Plan: 1. hypoxic encephalopathy  - moderate to severe secondary to tonic clonic seizure, amyloid angiopathy,              -patient may  shower             -ELOS/Goals: 12-14 days- min A to mod I 2.  Antithrombotics: -DVT/anticoagulation:  Pharmaceutical: Other (comment)--on Eliquis- also for Afib with RVR             -antiplatelet therapy: N/a 3. Pain Management: N/A 4. Mood: LCSW to follow for evaluation and support.              -antipsychotic agents: N/A 5. Neuropsych: This patient is not fully capable of making decisions on her own behalf.  1/23- very poor memory- con't regimen 6. Skin/Wound Care: Routine pressure relief measures.  7. Fluids/Electrolytes/Nutrition: Monitor I/O. Check lytes in am.  8. PAF: New onset--continue Metoprolol and eliquis BID. Vitals:   12/17/20 0518 12/17/20 0518  BP: (!) 141/77 (!) 141/77  Pulse: 64 64  Resp: 20 20  Temp: 97.8 F (36.6 C) 97.8 F (36.6 C)  SpO2: 100% 100%    Rate controlled but BP up 9. Dysphagia: On D2, honey liquids. Monitor hydration with check of lytes in am and next week.discussed normal progression to thin liquids  1/22- tolerating breakfast- con't regimen and recheck Monday  1/23- pt asking for coffee- will try to arrange thickened coffee for pt  10.  Hyponateremia: Will recheck Na in am--question dilutional  1/23- Na 132- will recheck labs next week, esp since on thickened liquids.   11. Acute on chronic Iron deficiency anemia:H/H down from 11-->8.7. Check stool guaiacs. Monitor for signs of bleeding. Will add iron supplement. Stools heme + monitor hgb on Eliquis - add protonix - GI f/u as OP  12. Leukocytosis:due to PNA  Resolved 13. Seizure prophylaxis: Stable on Keppra. Tolerating without SE.   14.  Urinary retention added urecholine UA neg - improved  15.  Hypoalbuminemia- will add Prostat- enc hi  protein food sources LOS: 7 days A FACE TO FACE EVALUATION WAS PERFORMED  Charlett Blake 12/17/2020, 7:54 AM

## 2020-12-18 NOTE — Progress Notes (Signed)
Speech Language Pathology Weekly Progress and Session Note  Patient Details  Name: Kristine Francis MRN: [id number] Date of Birth: 1941/04/28  Beginning of progress report period: December 11, 2020  End of progress report period: December 18, 2020  Today's Date: 12/18/2020 SLP Individual Time: 1120-1200 SLP Individual Time Calculation (min): 40 min  Short Term Goals: Week 1: SLP Short Term Goal 1 (Week 1): Pt will consume trials of dysphagia 3 textures with efficient mastication and oral clearance and Min s/sx aspiration across 2 sessions prior to upgrade. SLP Short Term Goal 1 - Progress (Week 1): Progressing toward goal SLP Short Term Goal 2 (Week 1): Pt will perform pharyngeal strengtheing and glottal closure exercises with Min A verbal/visual cues. SLP Short Term Goal 2 - Progress (Week 1): Met SLP Short Term Goal 3 (Week 1): Pt will demonstrate ability to problem solve mildly complex functional situations with Min A verbal/visual cues. SLP Short Term Goal 3 - Progress (Week 1): Progressing toward goal SLP Short Term Goal 4 (Week 1): Pt will recall new and/or daily functional information with Mod A cues for use of aids or strategies. SLP Short Term Goal 4 - Progress (Week 1): Met SLP Short Term Goal 5 (Week 1): Pt will identify 3 current deficits (some physical, some ST related - speech, swallow, cognition) with Mod A cues. SLP Short Term Goal 5 - Progress (Week 1): Progressing toward goal SLP Short Term Goal 6 (Week 1): Pt will use compensatory strategies for speech intelligibility to achieved 95% intelligibility in conversatoin with Supervision A cues. SLP Short Term Goal 6 - Progress (Week 1): Met    New Short Term Goals: Week 2: SLP Short Term Goal 1 (Week 2): STG=LTG due to remaining length of stay  Weekly Progress Updates:   Pt has made functional gains this reporting period and has met 3 out of 6 short term goals.  Pt is currently min-mod assist for tasks due to cognitive  deficits.  Pt has demonstrated improved use of speech intelligibility strategies and return demonstration of pharyngeal strengthening exercises.  Pt is consuming dys 2, honey thick liquids diet with min cues for use of swallowing precautions.  Pt and family education is ongoing.  Pt would continue to benefit from skilled ST while inpatient in order to maximize functional independence and reduce burden of care prior to discharge.  Anticipate that pt will need 24/7 supervision at discharge in addition to [place] follow up at next level of care    Intensity: Minumum of 1-2 x/day, 30 to 90 minutes Frequency: 3 to 5 out of 7 days Duration/Length of Stay: 1.5-2 weeks Treatment/Interventions: Cognitive remediation/compensation;Cueing hierarchy;Dysphagia/aspiration precaution training;Functional tasks;Patient/family education;Speech/Language facilitation;Internal/external aids   Daily Session  Skilled Therapeutic Interventions: Pt was seen for skilled ST targeting goals for cognition and dysphagia.  Pt was able to return demonstration of her prescribed pharyngeal strengthening exercises for sets of 10-20 with min supervision verbal cues.  No overt s/s of aspiration were noted with honey thick liquids in between repetitions of exercises, min-supervision cues for use of swallowing precautions.  Pt stated that she is looking forward to swallow test early next week.  She also recalled use of memory notebook and it's location in her room.  SLP updated notebook with details from today's therapy session to facilitate carryover of daily information.  Goals updated on this date to reflect current progress and plan of care.     General    Pain Pain Assessment Pain Scale: 0-10 Pain  Score: 0-No pain  Therapy/Group: Individual Therapy  Raymone Pembroke, Selinda Orion 12/18/2020, 4:39 PM

## 2020-12-18 NOTE — Progress Notes (Signed)
[facility] PHYSICAL MEDICINE & REHABILITATION PROGRESS NOTE   Subjective/Complaints: Eating breakfast, wet voice but no coughing , good intake thus far     ROS-   Pt denies SOB, abd pain, CP, N/V/D   Objective:   No results found. No results for input(s): WBC, HGB, HCT, PLT in the last 72 hours. No results for input(s): NA, K, CL, CO2, GLUCOSE, BUN, CREATININE, CALCIUM in the last 72 hours.  Intake/Output Summary (Last 24 hours) at 12/18/2020 0807 Last data filed at 12/18/2020 0263 Gross per 24 hour  Intake 720 ml  Output 976 ml  Net -256 ml     Pressure Injury 12/10/20 Sacrum Mid Stage 2 -  Partial thickness loss of dermis presenting as a shallow open injury with a red, pink wound bed without slough. Small circular area at tail bone, center (Active)  12/10/20 1505  Location: Sacrum  Location Orientation: Mid  Staging: Stage 2 -  Partial thickness loss of dermis presenting as a shallow open injury with a red, pink wound bed without slough.  Wound Description (Comments): Small circular area at tail bone, center  Present on Admission: Yes    Physical Exam: Vital Signs Blood pressure 138/67, pulse 77, temperature 98.1 F (36.7 C), resp. rate 15, height 5\' 1"  (1.549 m), weight 38.9 kg, SpO2 100 %.    General: No acute distress Mood and affect are appropriate Heart: Regular rate and rhythm no rubs murmurs or extra sounds Lungs: Clear to auscultation, breathing unlabored, no rales or wheezes Abdomen: Positive bowel sounds, soft nontender to palpation, nondistended Extremities: No clubbing, cyanosis, or edema Skin: No evidence of breakdown, no evidence of rash    Neurologic: Cranial nerves II through XII intact, motor strength is 5/5 in bilateral deltoid, bicep, tricep, grip, hip flexor, knee extensors, ankle dorsiflexor and plantar flexor Sensory exam normal sensation to light touch and proprioception in bilateral upper and lower extremities Cerebellar exam mild dysmetria  BUE  Musculoskeletal: Full range of motion in all 4 extremities. No joint swelling  Assessment/Plan: 1. Functional deficits which require 3+ hours per day of interdisciplinary therapy in a comprehensive inpatient rehab setting.  Physiatrist is providing close team supervision and 24 hour management of active medical problems listed below.  Physiatrist and rehab team continue to assess barriers to discharge/monitor patient progress toward functional and medical goals  Care Tool:  Bathing    Body parts bathed by patient: Right arm,Left arm,Chest,Front perineal area,Abdomen,Face,Right upper leg,Left upper leg,Right lower leg,Left lower leg,Buttocks   Body parts bathed by helper: Buttocks     Bathing assist Assist Level: Minimal Assistance - Patient > 75%     Upper Body Dressing/Undressing Upper body dressing   What is the patient wearing?: Pull over shirt    Upper body assist Assist Level: Supervision/Verbal cueing    Lower Body Dressing/Undressing Lower body dressing      What is the patient wearing?: Incontinence brief,Pants     Lower body assist Assist for lower body dressing: Minimal Assistance - Patient > 75%     Toileting Toileting    Toileting assist Assist for toileting: Maximal Assistance - Patient 25 - 49%     Transfers Chair/bed transfer  Transfers assist     Chair/bed transfer assist level: Minimal Assistance - Patient > 75% Chair/bed transfer assistive device: Walker,Armrests   Locomotion Ambulation   Ambulation assist      Assist level: Minimal Assistance - Patient > 75% Assistive device: Walker-rolling Max distance: 150ft   Walk  10 feet activity   Assist     Assist level: Minimal Assistance - Patient > 75% Assistive device: Walker-rolling   Walk 50 feet activity   Assist Walk 50 feet with 2 turns activity did not occur: Safety/medical concerns  Assist level: Minimal Assistance - Patient > 75% Assistive device: Walker-rolling     Walk 150 feet activity   Assist Walk 150 feet activity did not occur: Safety/medical concerns  Assist level: Minimal Assistance - Patient > 75% Assistive device: Walker-rolling    Walk 10 feet on uneven surface  activity   Assist Walk 10 feet on uneven surfaces activity did not occur: Safety/medical concerns         Wheelchair     Assist Will patient use wheelchair at discharge?: No Type of Wheelchair: Manual    Wheelchair assist level: Maximal Assistance - Patient 25 - 49% Max wheelchair distance: 75    Wheelchair 50 feet with 2 turns activity    Assist        Assist Level: Maximal Assistance - Patient 25 - 49%   Wheelchair 150 feet activity     Assist  Wheelchair 150 feet activity did not occur: Safety/medical concerns       Blood pressure 138/67, pulse 77, temperature 98.1 F (36.7 C), resp. rate 15, height 5\' 1"  (1.549 m), weight 38.9 kg, SpO2 100 %.  Medical Problem List and Plan: 1. hypoxic encephalopathy  - moderate to severe secondary to tonic clonic seizure, amyloid angiopathy,              -patient may  shower             -ELOS/Goals: 12-14 days- min A to mod I 2.  Antithrombotics: -DVT/anticoagulation:  Pharmaceutical: Other (comment)--on Eliquis- also for Afib with RVR             -antiplatelet therapy: N/a 3. Pain Management: N/A 4. Mood: LCSW to follow for evaluation and support.              -antipsychotic agents: N/A 5. Neuropsych: This patient is not fully capable of making decisions on her own behalf.  1/23- very poor memory- con't regimen 6. Skin/Wound Care: Routine pressure relief measures.  7. Fluids/Electrolytes/Nutrition: Monitor I/O. Check lytes in am.  8. PAF: New onset--continue Metoprolol and eliquis BID. Vitals:   12/17/20 1420 12/17/20 1919  BP: 129/73 138/67  Pulse: 63 77  Resp: 19 15  Temp: 97.6 F (36.4 C) 98.1 F (36.7 C)  SpO2: 100% 100%   Rate controlled but BP up 9. Dysphagia: On D2, honey  liquids. Monitor hydration with check of lytes in am and next week.discussed normal progression to thin liquids  1/22- tolerating breakfast- con't regimen and recheck Monday  1/23- pt asking for coffee- will try to arrange thickened coffee for pt  10.  Hyponateremia: Will recheck Na in am--question dilutional  1/23- Na 132- will recheck labs next week, esp since on thickened liquids.   11. Acute on chronic Iron deficiency anemia:H/H down from 11-->8.7. Check stool guaiacs. Monitor for signs of bleeding. Will add iron supplement. Stools heme + monitor hgb on Eliquis - add protonix - GI f/u as OP  12. Leukocytosis:due to PNA  Resolved 13. Seizure prophylaxis: Stable on Keppra. Tolerating without SE.   14.  Urinary retention added urecholine UA neg - improved  15.  Hypoalbuminemia- will add Prostat- enc hi protein food sources LOS: 8 days A FACE TO FACE EVALUATION WAS PERFORMED  Luanna Salk  [place] 12/18/2020, 8:07 AM

## 2020-12-18 NOTE — Progress Notes (Signed)
Occupational Therapy Weekly Progress Note  Patient Details  Name: Kristine Francis MRN: [id number] Date of Birth: 1940-12-14  Beginning of progress report period: December 11, 2020 End of progress report period: December 18, 2020  Today's Date: 12/18/2020 OT Individual Time: 1001-1058 OT Individual Time Calculation (min): 5 min    Mrs. Game is making steady progress with OT at this time.  She needs min to mod assist for transfers, with min assist with use of the RW and with mod assist for stand pivots without.  She is able to complete sit to stand with min assist during LB selfcare with increased trunkial ataxia noted in standing.  UB bathing and dressing are supervision level.  She needs min assist for LB bathing sit to stand with mod assist for LB dressing including donning shoes and tying them.  She continues to demonstrate decreased memory, asking the same questions multiple times during session.  She currently needs overall mod instructional cueing to sequencing through bathing and dressing tasks as well.  RUE functional use is at a gross assist level with decreased ability to use the RUE for pulling up her pants or tying her shoes.  Overall feel she is progressing some, but will need 24 hour supervision from her spouse at discharge.  Goals will be downgraded from modified independent to supervision at this time.  Recommend continued CIR level therapy until discharge on 12/24/20  Patient continues to demonstrate the following deficits: muscle weakness and muscle paralysis, impaired timing and sequencing, unbalanced muscle activation, ataxia and decreased coordination, decreased awareness, decreased problem solving, decreased memory and delayed processing and decreased standing balance, hemiplegia and decreased balance strategies and therefore will continue to benefit from skilled OT intervention to enhance overall performance with BADL and Reduce care partner burden.  Patient not progressing  toward long term goals.  See goal revision..  Continue plan of care.  OT Short Term Goals Week 2:  OT Short Term Goal 1 (Week 2): Continue working on LTGs set at supervision level overall.  Skilled Therapeutic Interventions/Progress Updates:    Pt completed bathing and dressing sit to stand at the sink during session.  Min guard assist for transfer to the EOB with mod assist for stand pivot transfer to the wheelchair.  Decreased forward weightshift noted with decreased hip extension with sit to stand.  Increased ataxia noted when stepping around to the wheelchair as well.  She completed all UB bathing and dressing with supervision.  She then completed LB bathing with min assist sit to stand and mod questioning cueing to sequence task.  She needed mod assist for standing to pull pants over hips with max assist for tying her shoes.  She finished session sitting in the wheelchair with her spouse present and safety belt in place.  Call button and phone in reach.    Therapy Documentation Precautions:  Precautions Precautions: Fall,Other (comment) Precaution Comments: seizure Restrictions Weight Bearing Restrictions: No  Pain: Pain Assessment Pain Scale: Faces Pain Score: 0-No pain ADL: See Care Tool Section for some details of mobility and selfcare  Therapy/Group: Individual Therapy  Labradford Schnitker OTR/L 12/18/2020, 12:16 PM

## 2020-12-18 NOTE — Progress Notes (Signed)
Physical Therapy Session Note  Patient Details  Name: Kristine Francis MRN: [id number] Date of Birth: Jun 25, 1941  Today's Date: 12/18/2020 PT Individual Time: 1345-1500 PT Individual Time Calculation (min): 75 min   Short Term Goals: Week 2:  PT Short Term Goal 1 (Week 2): = to LTGs based on ELOS  Skilled Therapeutic Interventions/Progress Updates:    Patient received sitting up in wc, husband at side, agreeable to PT. She denies pain. PT propelling patient in wc to therapy gym for time management and energy conservation. PT applying ACE wraps to trunk to attempt to alleviate truncal ataxia noted in sitting and standing with little success and then removed. 1.5# ankle weights applied, patient ambulated 23ft x2 with MinA, RW. WBOS and ataxia noted. 3# ankle weights applied with decreasing ataxia noted, but WBOS remained. PT applying MaxiSky sling to allow patient to ambulate using it as both fall precautions and compression to trunk to minimize truncal ataxia. Improvements noted, but patient remaining with similar gait deviations. PT laying out dots for patient to follow on ground with much improved gait mechanics noted. She was able to complete this 31ft path with MaxiSky harness, 3# ankle weights, dots to follow and RW with significant improvements in gait mechanics. Patient with excellent carry over of these gait mechanics ambulating 33ft back to her wc with RW and CGA. Normal BOS noted and only mild ataxia. She remains with forward flexed trunk, which she states is premorbid, and decreased B knee flexion through swing phase. Patient will benefit from further interventions addressing her gait mechanics in turn effecting her dynamic and ambulatory balance. Patient returning to room in wc, seatbelt alarm on, call light within reach.   Therapy Documentation Precautions:  Precautions Precautions: Fall,Other (comment) Precaution Comments: seizure Restrictions Weight Bearing Restrictions:  No    Therapy/Group: Individual Therapy  Karoline Caldwell, PT, DPT, CBIS  12/18/2020, 7:52 AM

## 2020-12-19 NOTE — Progress Notes (Signed)
Occupational Therapy Session Note  Patient Details  Name: Kristine Francis MRN: [id number] Date of Birth: 10-07-1941  Today's Date: 12/19/2020 OT Individual Time: 1005-1100 OT Individual Time Calculation (min): 55 min    Short Term Goals: Week 2:  OT Short Term Goal 1 (Week 2): Continue working on [employer] at supervision level overall.  Skilled Therapeutic Interventions/Progress Updates:    Pt worked on getting dressed to start session with NT and nursing student in to assist prior to therapist entering.  She was able to complete sit to stand from pulling her pants over her hips with min assist this session.  She then completed stand pivot transfer to the wheelchair with use of the RW and min assist.  Had her complete grooming task of brushing her hair with supervision as well prior to leaving the room.  Took pt down to the ortho gym where she worked on [employer], RUE coordination, and standing balance with use of the BITs.  She was able to complete 2 trials using Visual Scanning Program to locate numbers 1-25 while standing and using the RUE.  First set she demonstrated 65% accuracy with a time of 2:35 and and average reaction time of 6.1 seconds.  On the second interval she completed it with 73% accuracy in 1:58 and with an average time of 4.7 seconds.  Rest breaks given between each interval with mod instructional cueing for hand placement with sit to stand. She next completed cognitive memory program  where she suprisingly performed with 100% accuracy on 7 trials up to 8 word recall.  However, during session pt repeatedly forgets information she has been told over and over with general conversation with therapist such as "How long have you worked here? How many children do you have?"  Next, therapist had her work on [place] coordination in standing picking up playing cards one at a times with the RUE and then sorting them into piles of different suits.  She stood with overall min guard assist without use  of her UEs for support while completing task.  Increased trunk flexion noted at times requiring mod demonstrational cueing to correct.  Pt still with trunkial ataxia as well as right knee flexion noted.  Finished session with return to the room via wheelchair where she was left sitting up with the call button and phone in reach.  Safety belt in place as well.    Therapy Documentation Precautions:  Precautions Precautions: Fall,Other (comment) Precaution Comments: seizure Restrictions Weight Bearing Restrictions: No  Pain: Pain Assessment Pain Scale: Faces Pain Score: 0-No pain ADL: See Care Tool Section for some details of mobility and selfcare  Therapy/Group: Individual Therapy  Ressie Slevin OTR/L 12/19/2020, 12:25 PM

## 2020-12-19 NOTE — Plan of Care (Signed)
  Problem: RH BLADDER ELIMINATION Goal: RH STG MANAGE BLADDER WITH ASSISTANCE Description: STG Manage Bladder With mod I Assistance Outcome: Not Progressing; bladder scan   

## 2020-12-20 MED ORDER — AMLODIPINE BESYLATE 2.5 MG PO TABS
2.5000 mg | ORAL_TABLET | Freq: Every day | ORAL | Status: DC
  Administered 2020-12-20 – 2020-12-24 (×5): 2.5 mg via ORAL
  Filled 2020-12-20 (×4): qty 1

## 2020-12-20 NOTE — Plan of Care (Signed)
  Problem: RH BLADDER ELIMINATION Goal: RH STG MANAGE BLADDER WITH ASSISTANCE Description: STG Manage Bladder With mod I Assistance Outcome: Not Progressing; bladder scan   

## 2020-12-20 NOTE — Progress Notes (Signed)
Physical Therapy Session Note  Patient Details  Name: Kristine Francis MRN: [id number] Date of Birth: 02/17/1941  Today's Date: 12/20/2020 PT Individual Time: 1400-1515 PT Individual Time Calculation (min): 75 min   Short Term Goals: Week 2:  PT Short Term Goal 1 (Week 2): = to LTGs based on ELOS  Skilled Therapeutic Interventions/Progress Updates:     Patient in bed asleep with her husband at bedside upon PT arrival. Patient easily aroused and agreeable to PT session. Patient denied pain during session.  Therapeutic Activity: Bed Mobility: Per patient's request, patient's husband donned patient's socks with total A bed level. Patient performed supine to sit with supervision in a flat bed without use of bed rail. Provided verbal cues for limiting use of bed rail to simulate home set up. Patient donned shoes with mod-max A for energy conservation/time management due to patient externally distracted during task.  Transfers: Patient performed sit to/from stand from the bed, toilet, and arm chair with CGA using RW and min A without RW. Provided verbal cues for scooting forward, forward weight shift, and hand placement x2. Patient was continent of bowl and bladder during toileting, performed peri-care and lower body dressing with total A. Patient ambulated to/from the bathroom with CGA and min A at inclined threshold due to posterior LOB. She stood at the sink to perform hand hygeine requiring cues for locating soap and paper towels and was verbose and externally distracted, requiring redirection to task.  Gait Training:  Patient ambulated 25 feet using RW with CGA, then RW was removed for another 110 feet and she continued ambulating with B hands on therapist's shoulders with min A focused on looking ahead and trunk extension for improved posture, foot clearance, and lateral hip stability. Ambulated with mildly increased BOS, mild ataxic gait, improved with target stepping to therapist's foot with  only intermittent looking down to maintain posutre. She ambulated 40 feet with 2 180 deg turns with mod A with HHA on R with decreased trunk control and lateral hip stability, focused on glut med activation in stance and righting reactions with LOB throughout. Patient ambulated 135 feet back to her room using RW with CGA and intermittent min A for RW management. Elevated RW to encourage upright posture, and cued patient on close proximity to RW for safety and focus on lateral hip stability and consistent stepping with neutral BOS throughout, noted improvement in trunk and hip control following gait training above.  Neuromuscular Re-ed: Patient performed the following activities: -sit to/from stand without upper extremity support 2x5, progressed from mod-CGA with intermittent min A for posterior LOB, focused on forward weight shift, scooting edge of chair, and foot placement prior to stand for improved balance with a functional task -reciprocal scooting forward x6 focused on head hips relationship for improved scooting EOB  -alternating step-tap to 6" step with upper extremity support on RW focused on increased knee/hip flexion, lateral hip muscle activation in stance, and consistent foot placement on colored dots, min A-CGA for balance  Patient with perseveration and poor short term memory throughout session. Frequently asked the same questions and perseverates on one topic. Patient without recall of asking the same question throughout session. Educated patient and her husband on diagnosis and general recovery. Patient recalls events leading up to arriving at the hospital, but does not recall anything following her seizure. Discussed patient's progress and patient's husband reports significant improvement since patient's admission in mobility and some cognitive recovery. Educated on follow-up therapies and expected continued progress  after d/c.   Patient in w/c with her husband in the room at end of  session with breaks locked, seat belt alarm set, and all needs within reach.    Therapy Documentation Precautions:  Precautions Precautions: Fall,Other (comment) Precaution Comments: seizure Restrictions Weight Bearing Restrictions: No   Therapy/Group: Individual Therapy  Alexandru Moorer L Gwendalyn Mcgonagle PT, DPT  12/20/2020, 4:02 PM

## 2020-12-20 NOTE — Progress Notes (Signed)
[facility] PHYSICAL MEDICINE & REHABILITATION PROGRESS NOTE   Subjective/Complaints: No issues overnight    ROS-   Pt denies SOB, abd pain, CP, N/V/D   Objective:   No results found. No results for input(s): WBC, HGB, HCT, PLT in the last 72 hours. No results for input(s): NA, K, CL, CO2, GLUCOSE, BUN, CREATININE, CALCIUM in the last 72 hours.  Intake/Output Summary (Last 24 hours) at 12/20/2020 0727 Last data filed at 12/20/2020 0600 Gross per 24 hour  Intake 720 ml  Output 100 ml  Net 620 ml     Pressure Injury 12/10/20 Sacrum Mid Stage 2 -  Partial thickness loss of dermis presenting as a shallow open injury with a red, pink wound bed without slough. Small circular area at tail bone, center (Active)  12/10/20 1505  Location: Sacrum  Location Orientation: Mid  Staging: Stage 2 -  Partial thickness loss of dermis presenting as a shallow open injury with a red, pink wound bed without slough.  Wound Description (Comments): Small circular area at tail bone, center  Present on Admission: Yes    Physical Exam: Vital Signs Blood pressure (!) 160/79, pulse 60, temperature 98.1 F (36.7 C), resp. rate 17, height 5\' 1"  (1.549 m), weight 39.2 kg, SpO2 100 %.  General: No acute distress Mood and affect are appropriate Heart: Regular rate and rhythm no rubs murmurs or extra sounds Lungs: Clear to auscultation, breathing unlabored, no rales or wheezes Abdomen: Positive bowel sounds, soft nontender to palpation, nondistended Extremities: No clubbing, cyanosis, or edema Skin: No evidence of breakdown, no evidence of rash   Neurologic: Cranial nerves II through XII intact, motor strength is 5/5 in bilateral deltoid, bicep, tricep, grip, hip flexor, knee extensors, ankle dorsiflexor and plantar flexor Sensory exam normal sensation to light touch and proprioception in bilateral upper and lower extremities Cerebellar exam mild dysmetria BUE  Musculoskeletal: Full range of motion in all  4 extremities. No joint swelling  Assessment/Plan: 1. Functional deficits which require 3+ hours per day of interdisciplinary therapy in a comprehensive inpatient rehab setting.  Physiatrist is providing close team supervision and 24 hour management of active medical problems listed below.  Physiatrist and rehab team continue to assess barriers to discharge/monitor patient progress toward functional and medical goals  Care Tool:  Bathing    Body parts bathed by patient: Right arm,Left arm,Chest,Front perineal area,Abdomen,Face,Right upper leg,Left upper leg,Right lower leg,Left lower leg,Buttocks   Body parts bathed by helper: Buttocks     Bathing assist Assist Level: Minimal Assistance - Patient > 75%     Upper Body Dressing/Undressing Upper body dressing   What is the patient wearing?: Pull over shirt    Upper body assist Assist Level: Moderate Assistance - Patient 50 - 74%    Lower Body Dressing/Undressing Lower body dressing      What is the patient wearing?: Pants     Lower body assist Assist for lower body dressing: Moderate Assistance - Patient 50 - 74%     Toileting Toileting    Toileting assist Assist for toileting: Maximal Assistance - Patient 25 - 49%     Transfers Chair/bed transfer  Transfers assist     Chair/bed transfer assist level: Minimal Assistance - Patient > 75% (RW stand pivot) Chair/bed transfer assistive device: Armrests   Locomotion Ambulation   Ambulation assist      Assist level: Contact Guard/Touching assist Assistive device: Walker-platform Max distance: 50   Walk 10 feet activity   Assist  Assist level: Contact Guard/Touching assist Assistive device: Walker-rolling   Walk 50 feet activity   Assist Walk 50 feet with 2 turns activity did not occur: Safety/medical concerns  Assist level: Contact Guard/Touching assist Assistive device: Walker-rolling    Walk 150 feet activity   Assist Walk 150 feet  activity did not occur: Safety/medical concerns  Assist level: Minimal Assistance - Patient > 75% Assistive device: Walker-rolling    Walk 10 feet on uneven surface  activity   Assist Walk 10 feet on uneven surfaces activity did not occur: Safety/medical concerns         Wheelchair     Assist Will patient use wheelchair at discharge?: No Type of Wheelchair: Manual    Wheelchair assist level: Maximal Assistance - Patient 25 - 49% Max wheelchair distance: 75    Wheelchair 50 feet with 2 turns activity    Assist        Assist Level: Maximal Assistance - Patient 25 - 49%   Wheelchair 150 feet activity     Assist  Wheelchair 150 feet activity did not occur: Safety/medical concerns       Blood pressure (!) 160/79, pulse 60, temperature 98.1 F (36.7 C), resp. rate 17, height 5\' 1"  (1.549 m), weight 39.2 kg, SpO2 100 %.  Medical Problem List and Plan: 1. hypoxic encephalopathy  - moderate to severe secondary to tonic clonic seizure, amyloid angiopathy,              -patient may  shower             -ELOS/Goals: 12-14 days- min A to mod I 2.  Antithrombotics: -DVT/anticoagulation:  Pharmaceutical: Other (comment)--on Eliquis- also for Afib with RVR             -antiplatelet therapy: N/a 3. Pain Management: N/A 4. Mood: LCSW to follow for evaluation and support.              -antipsychotic agents: N/A 5. Neuropsych: This patient is not fully capable of making decisions on her own behalf.  1/23- very poor memory- con't regimen 6. Skin/Wound Care: Routine pressure relief measures.  7. Fluids/Electrolytes/Nutrition: Monitor I/O. Check lytes in am.  8. PAF: New onset--continue Metoprolol and eliquis BID. Vitals:   12/19/20 1937 12/20/20 0455  BP: (!) 140/98 (!) 160/79  Pulse: 75 60  Resp: 16 17  Temp: 97.8 F (36.6 C) 98.1 F (36.7 C)  SpO2: 98% 100%   Rate controlled but BP up, add low dose amlodipine  9. Dysphagia: On D2, honey liquids. Monitor  hydration with check of lytes in am and next week.discussed normal progression to thin liquids  1/22- tolerating breakfast- con't regimen and recheck Monday  1/23- pt asking for coffee- will try to arrange thickened coffee for pt  10.  Hyponateremia: Will recheck Na in am--question dilutional  1/23- Na 132- will recheck labs next week, esp since on thickened liquids.   11. Acute on chronic Iron deficiency anemia:H/H down from 11-->8.7. Check stool guaiacs. Monitor for signs of bleeding. Will add iron supplement. Stools heme + monitor hgb on Eliquis - add protonix - GI f/u as OP , recheck Hgb in am  12. Leukocytosis:due to PNA  Resolved 13. Seizure prophylaxis: Stable on Keppra. Tolerating without SE.   14.  Urinary retention added urecholine UA neg - improved  15.  Hypoalbuminemia- will add Prostat- enc hi protein food sources LOS: 10 days A FACE TO FACE EVALUATION WAS PERFORMED  Charlett Blake 12/20/2020, 7:27  AM

## 2020-12-21 LAB — BASIC METABOLIC PANEL
Anion gap: 8 (ref 5–15)
BUN: 15 mg/dL (ref 8–23)
CO2: 28 mmol/L (ref 22–32)
Calcium: 9 mg/dL (ref 8.9–10.3)
Chloride: 98 mmol/L (ref 98–111)
Creatinine, Ser: 0.73 mg/dL (ref 0.44–1.00)
GFR, Estimated: >60 mL/min (ref >60)
Glucose, Bld: 95 mg/dL (ref 70–99)
Potassium: 4.1 mmol/L (ref 3.5–5.1)
Sodium: 134 mmol/L — ABNORMAL LOW (ref 135–145)

## 2020-12-21 LAB — CBC
HCT: 27.3 % — ABNORMAL LOW (ref 36.0–46.0)
Hemoglobin: 8.9 g/dL — ABNORMAL LOW (ref 12.0–15.0)
MCH: 29.9 pg (ref 26.0–34.0)
MCHC: 32.6 g/dL (ref 30.0–36.0)
MCV: 91.6 fL (ref 80.0–100.0)
Platelets: 445 10*3/uL — ABNORMAL HIGH (ref 150–400)
RBC: 2.98 MIL/uL — ABNORMAL LOW (ref 3.87–5.11)
RDW: 14.4 % (ref 11.5–15.5)
WBC: 6.6 10*3/uL (ref 4.0–10.5)
nRBC: 0 % (ref 0.0–0.2)

## 2020-12-21 NOTE — Progress Notes (Signed)
[facility] PHYSICAL MEDICINE & REHABILITATION PROGRESS NOTE   Subjective/Complaints: No complaints this morning. Denies pain, constipation, insomnia.  Husband is at bedside Therapy notes reviewed, she asks for husband's assistance during sessions    ROS-  Pt denies SOB, abd pain, CP, N/V/D, constipation   Objective:   No results found. Recent Labs    12/21/20 0509  WBC 6.6  HGB 8.9*  HCT 27.3*  PLT 445*   Recent Labs    12/21/20 0509  NA 134*  K 4.1  CL 98  CO2 28  GLUCOSE 95  BUN 15  CREATININE 0.73  CALCIUM 9.0    Intake/Output Summary (Last 24 hours) at 12/21/2020 1217 Last data filed at 12/21/2020 0851 Gross per 24 hour  Intake 580 ml  Output --  Net 580 ml     Pressure Injury 12/10/20 Sacrum Mid Stage 2 -  Partial thickness loss of dermis presenting as a shallow open injury with a red, pink wound bed without slough. Small circular area at tail bone, center (Active)  12/10/20 1505  Location: Sacrum  Location Orientation: Mid  Staging: Stage 2 -  Partial thickness loss of dermis presenting as a shallow open injury with a red, pink wound bed without slough.  Wound Description (Comments): Small circular area at tail bone, center  Present on Admission: Yes    Physical Exam: Vital Signs Blood pressure 127/88, pulse 78, temperature 98.3 F (36.8 C), resp. rate 18, height 5\' 1"  (1.549 m), weight 40.5 kg, SpO2 100 %.  Gen: no distress, normal appearing HEENT: oral mucosa pink and moist, NCAT Cardio: Reg rate Chest: normal effort, normal rate of breathing Abd: soft, non-distended Ext: no edema Psych: pleasant, normal affect Skin: intact Neurologic: Cranial nerves II through XII intact, motor strength is 5/5 in bilateral deltoid, bicep, tricep, grip, hip flexor, knee extensors, ankle dorsiflexor and plantar flexor Sensory exam normal sensation to light touch and proprioception in bilateral upper and lower extremities Cerebellar exam mild dysmetria BUE   Musculoskeletal: Full range of motion in all 4 extremities. No joint swelling  Assessment/Plan: 1. Functional deficits which require 3+ hours per day of interdisciplinary therapy in a comprehensive inpatient rehab setting.  Physiatrist is providing close team supervision and 24 hour management of active medical problems listed below.  Physiatrist and rehab team continue to assess barriers to discharge/monitor patient progress toward functional and medical goals  Care Tool:  Bathing    Body parts bathed by patient: Right arm,Left arm,Chest,Front perineal area,Abdomen,Face,Right upper leg,Left upper leg,Right lower leg,Left lower leg,Buttocks   Body parts bathed by helper: Buttocks     Bathing assist Assist Level: Minimal Assistance - Patient > 75%     Upper Body Dressing/Undressing Upper body dressing   What is the patient wearing?: Pull over shirt    Upper body assist Assist Level: Moderate Assistance - Patient 50 - 74%    Lower Body Dressing/Undressing Lower body dressing      What is the patient wearing?: Pants     Lower body assist Assist for lower body dressing: Moderate Assistance - Patient 50 - 74%     Toileting Toileting    Toileting assist Assist for toileting: Maximal Assistance - Patient 25 - 49%     Transfers Chair/bed transfer  Transfers assist     Chair/bed transfer assist level: Minimal Assistance - Patient > 75% Chair/bed transfer assistive device: Programmer, multimedia   Ambulation assist      Assist level: Minimal Assistance -  Patient > 75% Assistive device: Walker-rolling Max distance: 80   Walk 10 feet activity   Assist     Assist level: Minimal Assistance - Patient > 75% Assistive device: Walker-rolling   Walk 50 feet activity   Assist Walk 50 feet with 2 turns activity did not occur: Safety/medical concerns  Assist level: Minimal Assistance - Patient > 75% Assistive device: Walker-rolling    Walk 150  feet activity   Assist Walk 150 feet activity did not occur: Safety/medical concerns  Assist level: Minimal Assistance - Patient > 75% Assistive device: Walker-rolling    Walk 10 feet on uneven surface  activity   Assist Walk 10 feet on uneven surfaces activity did not occur: Safety/medical concerns         Wheelchair     Assist Will patient use wheelchair at discharge?: No Type of Wheelchair: Manual    Wheelchair assist level: Maximal Assistance - Patient 25 - 49% Max wheelchair distance: 75    Wheelchair 50 feet with 2 turns activity    Assist        Assist Level: Maximal Assistance - Patient 25 - 49%   Wheelchair 150 feet activity     Assist  Wheelchair 150 feet activity did not occur: Safety/medical concerns       Blood pressure 127/88, pulse 78, temperature 98.3 F (36.8 C), resp. rate 18, height 5\' 1"  (1.549 m), weight 40.5 kg, SpO2 100 %.  Medical Problem List and Plan: 1. hypoxic encephalopathy  - moderate to severe secondary to tonic clonic seizure, amyloid angiopathy,              -patient may  shower             -ELOS/Goals: 12-14 days- min A to mod I  -Continue CIR 2.  Antithrombotics: -DVT/anticoagulation:  Pharmaceutical: Other (comment)--on Eliquis- also for Afib with RVR             -antiplatelet therapy: N/a 3. Pain Management: N/A 4. Mood: LCSW to follow for evaluation and support.              -antipsychotic agents: N/A 5. Neuropsych: This patient is not fully capable of making decisions on her own behalf.  1/23- very poor memory- con't regimen 6. Skin/Wound Care: Routine pressure relief measures.  7. Fluids/Electrolytes/Nutrition: Monitor I/O. Check lytes in am.  8. PAF: New onset--continue Metoprolol and eliquis BID. Vitals:   12/21/20 0356 12/21/20 0858  BP: (!) 142/72 127/88  Pulse: 62 78  Resp: 18   Temp: 98.3 F (36.8 C)   SpO2: 100%    Rate controlled but BP up, add low dose amlodipine   1/31: BP better  controlled with amlodipine. 9. Dysphagia: On D2, honey liquids. Monitor hydration with check of lytes in am and next week.discussed normal progression to thin liquids  1/22- tolerating breakfast- con't regimen and recheck Monday  1/23- pt asking for coffee- will try to arrange thickened coffee for pt  10.  Hyponateremia: Will recheck Na in am--question dilutional  1/23- Na 132- will recheck labs next week, esp since on thickened liquids.    1/31: Na reviewed, up to 134, repeat weekly 11. Acute on chronic Iron deficiency anemia:H/H down from 11-->8.7. Check stool guaiacs. Monitor for signs of bleeding. Will add iron supplement. Stools heme + monitor hgb on Eliquis - add protonix - GI f/u as OP ,   1/31: Hgb reviewed, 8.9 up from 8.4, repeat weekly 12. Leukocytosis:due to PNA  Resolved 13.  Seizure prophylaxis: Stable on Keppra. Tolerating without SE.   14.  Urinary retention added urecholine UA neg - improved  15.  Hypoalbuminemia- will add Prostat- enc hi protein food sources LOS: 11 days A FACE TO FACE EVALUATION WAS PERFORMED  Clide Deutscher Porfiria Heinrich 12/21/2020, 12:17 PM

## 2020-12-21 NOTE — Progress Notes (Signed)
Speech Language Pathology Daily Session Note  Patient Details  Name: Kristine Francis MRN: [id number] Date of Birth: [birth year]  Today's Date: 12/21/2020 SLP Individual Time: 1440-1535 SLP Individual Time Calculation (min): 55 min  Short Term Goals: Week 2: SLP Short Term Goal 1 (Week 2): STG=LTG due to remaining length of stay  Skilled Therapeutic Interventions:Skilled ST services focused on education, swallow and cognitive skills. Pt's husband was present during treatment session. SLP provided education pertaining to planned MBS tomorrow. Pt was unable to recall swallow strategies, however after education recalled with min A verbal cues throughout the session. Pt was able to recall swallow exercises CTAR and Masako with supervision A verbal cues. Pt completed oral care via suction toothbrush, pt was preservative on task and required verbal cues to end steps. Pt consumed x5 ice chips, noting piecemeal swallow with x1 immediate throat clear noted and x1 delayed cough noted. Pt completed x10 CTAR and x5 masako exercises. SLP also facilitated problem solving skill with personal medication management, provided medication list of name/function/times per day. Pt required mod A fade to min A verbal cues for verbal problem solving times per day.SLP provided education of need for assistance with medication management and mildly complex problem solving tasks, pt's husband agreed. All questions answered to satisfaction. Pt was left with chair alarm set, husband in room and call bell within reach. Recommend to continue ST services.     Pain Pain Assessment Pain Score: 0-No pain  Therapy/Group: Individual Therapy  Haily Caley  [facility] 12/21/2020, 3:38 PM

## 2020-12-21 NOTE — Progress Notes (Signed)
Occupational Therapy Session Note  Patient Details  Name: Kristine Francis MRN: [id number] Date of Birth: [birth year]  Today's Date: 12/21/2020 OT Individual Time: 1300-1330 OT Individual Time Calculation (min): 30 min    Short Term Goals: Week 2:  OT Short Term Goal 1 (Week 2): Continue working on [employer] at supervision level overall.  Skilled Therapeutic Interventions/Progress Updates:    Patient seated in w/c finishing lunch with husband supervision level.  She is pleasant, cooperative, denies pain and repeats questions/comments t/o session.  SPT to/from mat table with CGA, cues for weight shift.  She tolerates unsupported sitting with CS for trunk/arm and leg coordination activities, standing weight shift and marching activity with min A and bilateral UE support.  Hand off to PT at close of session.    Therapy Documentation Precautions:  Precautions Precautions: Fall,Other (comment) Precaution Comments: seizure Restrictions Weight Bearing Restrictions: No   Therapy/Group: Individual Therapy  Kristine Francis 12/21/2020, 7:50 AM

## 2020-12-21 NOTE — Progress Notes (Signed)
Physical Therapy Session Note  Patient Details  Name: Kristine Francis MRN: [id number] Date of Birth: July 12, 1941  Today's Date: 12/21/2020 PT Individual Time: 1120-1205  And 1330-1400 PT Individual Time Calculation (min): 45 min  And 30 min  Short Term Goals: Week 1:  PT Short Term Goal 1 (Week 1): pt to demonstrate supine<>sit CGA consistently PT Short Term Goal 1 - Progress (Week 1): Met PT Short Term Goal 2 (Week 1): pt to demonstrate functional transfers with LRAD at [facility] PT Short Term Goal 2 - Progress (Week 1): Progressing toward goal PT Short Term Goal 3 (Week 1): pt to demonstrate ambulation with RW for 50' min A PT Short Term Goal 3 - Progress (Week 1): Met PT Short Term Goal 4 (Week 1): pt to demonstrate dynamic standing balance at min A consistently PT Short Term Goal 4 - Progress (Week 1): Progressing toward goal Week 2:  PT Short Term Goal 1 (Week 2): = to LTGs based on ELOS Week 3:     Skilled Therapeutic Interventions/Progress Updates:   AM SESSION PAIN Denies pain  Pt initially oob in wc. Awake and alert.  Asks "are we going down the hall for therapy?"  Husband at pts side.  Total assist to don socks and shoes for time efficiency.  Transported to gym for session.   Sit to stand from wc w/min assist to steady herself w/transition. Gait 53f w/RW, min assist and cues for upright posture and gaze.  Ataxic appearing gait/flexed posture, downward gaze, corrects w/cues. Turn/sit to chair w/cues for sequencing/safety.  LE coordination + standing balance task - standing w/use of RW for balance tapping dots placed on floor in semicircle pattern w/emphasis on controlled placement/modulation of movement, performed w/bilat LEs, min assist for balance.  Increased difficulty R vs L  Gait 666fw/rw as above to stairs proceeded to ascend/descend 4 stairs w/2 rails w/mn to light mod assist and cues for upright posture/has tendency to sink into heavily flexed posture without frequent  cueing.    Repeated Sit to stand x 10 reps w/use of hands and RW, performed for functional strengthening.  Pt transported back to room.  Pt left oob in wc w/alarm belt set and needs in reach, husband at side.  Pt engages in appropriate conversation during session, but poor recall/frequently repeats questions without awareness despite consistently repeated sesponses from therapist.  Very pleasant.  PM SESSION PAIN denies pain Pt initially oob in wc handed off from OT.  Introduces herself, does not recall meeting me this am.   Pt Sit to stand from wc and gait x 2002fPushing weighted grocery cart to facilitate core activation and upright posture, cues for posture but improved from am w/RW.    Standing w/min assist for balance to encourage upright posture: Alternating overhead reach x 20 bilat overhead reach x 20  Gait x [id number] grocery cart as above. Turn/sit to chair w/min assist.  Pt then transfers chair to wc w/min assist.  Transported back to room and left oob in wc w/alarm set and needs in reach, husband entering room.    Therapy Documentation Precautions:  Precautions Precautions: Fall,Other (comment) Precaution Comments: seizure Restrictions Weight Bearing Restrictions: No    Therapy/Group: Individual Therapy  BarbCallie Fielding  [place]1/2022, 12:13 PM

## 2020-12-22 ENCOUNTER — Inpatient Hospital Stay (HOSPITAL_COMMUNITY): Payer: Medicare Other

## 2020-12-22 NOTE — Progress Notes (Signed)
Speech Language Pathology Daily Session Note  Patient Details  Name: Kristine Francis MRN: [id number] Date of Birth: 03/27/1941  Today's Date: 12/22/2020 SLP Individual Time: 1430-1504 SLP Individual Time Calculation (min): 34 min  Short Term Goals: Week 2: SLP Short Term Goal 1 (Week 2): STG=LTG due to remaining length of stay  Skilled Therapeutic Interventions: Pt was seen for skilled ST targeting education regarding pt's swallow function with pt and her husband at bedside. SLP provided verbal review of MBSS results from earlier this AM, including specific chronic and acute deficits. Also provided verbal review of compensatory swallow strategies and rationale, and recommendation for pt's husband to continue to supervise intake at home to help cue to to do extra dry swallows and intermittent throat clear, due to severe short term memory deficits that limits her independence with implementing this strategies. Also provided handout and verbal review of current Dys 2 (minched/ground) texture solid recommendation and answered specific questions about how to modify food items to achieve that textures as well as specific foods items that would be in or out of compliance.  Will continue to review more detail about thickened liquids tomorrow, given that time constraints would not allow for such today. Pt left sitting in bed with alarm set and needs within reach, husband still present. Continue per current plan of care.          Pain Pain Assessment Pain Scale: 0-10 Pain Score: 0-No pain  Therapy/Group: Individual Therapy  Arbutus Leas 12/22/2020, 7:23 AM

## 2020-12-22 NOTE — Progress Notes (Signed)
[facility] PHYSICAL MEDICINE & REHABILITATION PROGRESS NOTE   Subjective/Complaints: Husband at bedside  Pt oriented to hospital but not time (March)  Discussed d/c date    ROS-  Pt denies SOB, abd pain, CP, N/V/D, constipation   Objective:   No results found. Recent Labs    12/21/20 0509  WBC 6.6  HGB 8.9*  HCT 27.3*  PLT 445*   Recent Labs    12/21/20 0509  NA 134*  K 4.1  CL 98  CO2 28  GLUCOSE 95  BUN 15  CREATININE 0.73  CALCIUM 9.0    Intake/Output Summary (Last 24 hours) at 12/22/2020 0825 Last data filed at 12/21/2020 1810 Gross per 24 hour  Intake 740 ml  Output --  Net 740 ml     Pressure Injury 12/10/20 Sacrum Mid Stage 2 -  Partial thickness loss of dermis presenting as a shallow open injury with a red, pink wound bed without slough. Small circular area at tail bone, center (Active)  12/10/20 1505  Location: Sacrum  Location Orientation: Mid  Staging: Stage 2 -  Partial thickness loss of dermis presenting as a shallow open injury with a red, pink wound bed without slough.  Wound Description (Comments): Small circular area at tail bone, center  Present on Admission: Yes    Physical Exam: Vital Signs Blood pressure 136/87, pulse 68, temperature 97.6 F (36.4 C), resp. rate 14, height 5\' 1"  (1.549 m), weight 40.2 kg, SpO2 100 %.   General: No acute distress Mood and affect are appropriate Heart: Regular rate and rhythm no rubs murmurs or extra sounds Lungs: Clear to auscultation, breathing unlabored, no rales or wheezes Abdomen: Positive bowel sounds, soft nontender to palpation, nondistended Extremities: No clubbing, cyanosis, or edema Skin: No evidence of breakdown, no evidence of rash  Neurologic: Cranial nerves II through XII intact, motor strength is 5/5 in bilateral deltoid, bicep, tricep, grip, hip flexor, knee extensors, ankle dorsiflexor and plantar flexor Sensory exam normal sensation to light touch and proprioception in bilateral  upper and lower extremities Cerebellar exam mild dysmetria BUE  Musculoskeletal: Full range of motion in all 4 extremities. No joint swelling  Assessment/Plan: 1. Functional deficits which require 3+ hours per day of interdisciplinary therapy in a comprehensive inpatient rehab setting.  Physiatrist is providing close team supervision and 24 hour management of active medical problems listed below.  Physiatrist and rehab team continue to assess barriers to discharge/monitor patient progress toward functional and medical goals  Care Tool:  Bathing    Body parts bathed by patient: Right arm,Left arm,Chest,Front perineal area,Abdomen,Face,Right upper leg,Left upper leg,Right lower leg,Left lower leg,Buttocks   Body parts bathed by helper: Buttocks     Bathing assist Assist Level: Minimal Assistance - Patient > 75%     Upper Body Dressing/Undressing Upper body dressing   What is the patient wearing?: Pull over shirt    Upper body assist Assist Level: Moderate Assistance - Patient 50 - 74%    Lower Body Dressing/Undressing Lower body dressing      What is the patient wearing?: Pants     Lower body assist Assist for lower body dressing: Moderate Assistance - Patient 50 - 74%     Toileting Toileting    Toileting assist Assist for toileting: Maximal Assistance - Patient 25 - 49%     Transfers Chair/bed transfer  Transfers assist     Chair/bed transfer assist level: Minimal Assistance - Patient > 75% Chair/bed transfer assistive device: Environmental consultant  Locomotion Ambulation   Ambulation assist      Assist level: Minimal Assistance - Patient > 75% Assistive device: Walker-rolling Max distance: 80   Walk 10 feet activity   Assist     Assist level: Minimal Assistance - Patient > 75% Assistive device: Walker-rolling   Walk 50 feet activity   Assist Walk 50 feet with 2 turns activity did not occur: Safety/medical concerns  Assist level: Minimal Assistance -  Patient > 75% Assistive device: Walker-rolling    Walk 150 feet activity   Assist Walk 150 feet activity did not occur: Safety/medical concerns  Assist level: Minimal Assistance - Patient > 75% Assistive device: Walker-rolling    Walk 10 feet on uneven surface  activity   Assist Walk 10 feet on uneven surfaces activity did not occur: Safety/medical concerns         Wheelchair     Assist Will patient use wheelchair at discharge?: No Type of Wheelchair: Manual    Wheelchair assist level: Maximal Assistance - Patient 25 - 49% Max wheelchair distance: 75    Wheelchair 50 feet with 2 turns activity    Assist        Assist Level: Maximal Assistance - Patient 25 - 49%   Wheelchair 150 feet activity     Assist  Wheelchair 150 feet activity did not occur: Safety/medical concerns       Blood pressure 136/87, pulse 68, temperature 97.6 F (36.4 C), resp. rate 14, height 5\' 1"  (1.549 m), weight 40.2 kg, SpO2 100 %.  Medical Problem List and Plan: 1. hypoxic encephalopathy  - moderate to severe secondary to tonic clonic seizure, amyloid angiopathy,              -patient may  shower             -ELOS/Goals: plan d/c 2/3  -Continue CIR, team conf in am  2.  Antithrombotics: -DVT/anticoagulation:  Pharmaceutical: Other (comment)--on Eliquis- also for Afib with RVR             -antiplatelet therapy: N/a 3. Pain Management: N/A 4. Mood: LCSW to follow for evaluation and support.              -antipsychotic agents: N/A 5. Neuropsych: This patient is not fully capable of making decisions on her own behalf.  1/23- very poor memory- con't regimen 6. Skin/Wound Care: Routine pressure relief measures.  7. Fluids/Electrolytes/Nutrition: Monitor I/O. Check lytes in am.  8. PAF: New onset--continue Metoprolol and eliquis BID. Vitals:   12/22/20 0330 12/22/20 0755  BP: (!) 143/63 136/87  Pulse: 67 68  Resp: 14   Temp: 97.6 F (36.4 C)   SpO2: 100%    Rate  controlled but BP up, add low dose amlodipine   2/1: BP better controlled with amlodipine. 9. Dysphagia: On D2, honey liquids. Monitor hydration with check of lytes in am and next week.discussed normal progression to thin liquids  1/22- tolerating breakfast- con't regimen and recheck Monday  1/23- pt asking for coffee- will try to arrange thickened coffee for pt  10.  Hyponateremia: Will recheck Na in am--question dilutional  1/23- Na 132- will recheck labs next week, esp since on thickened liquids.    1/31: Na reviewed, up to 134, repeat weekly 11. Acute on chronic Iron deficiency anemia:H/H down from 11-->8.7. Check stool guaiacs. Monitor for signs of bleeding. Will add iron supplement. Stools heme + monitor hgb on Eliquis - add protonix - GI f/u as OP ,  1/31: Hgb reviewed, 8.9 up from 8.4, repeat weekly 12. Leukocytosis:due to PNA  Resolved 13. Seizure prophylaxis: Stable on Keppra. Tolerating without SE.   14.  Urinary retention added urecholine UA neg - improved  15.  Hypoalbuminemia- will add Prostat- enc hi protein food sources LOS: 12 days A FACE TO Marion E Quetzali Heinle 12/22/2020, 8:25 AM

## 2020-12-22 NOTE — Progress Notes (Signed)
Occupational Therapy Session Note  Patient Details  Name: Kristine Francis MRN: [id number] Date of Birth: [birth year]  Today's Date: 12/22/2020 OT Individual Time: 1005-1104 OT Individual Time Calculation (min): 59 min    Short Term Goals: Week 2:  OT Short Term Goal 1 (Week 2): Continue working on [employer] at supervision level overall.  Skilled Therapeutic Interventions/Progress Updates:    Pt completed supine to sit EOB with supervision to begin session.  She continues to exhibit decreased efficiency with scooting EOB but was able to complete with min guard assist.  Pt's spouse present throughout session, so provided education throughout in preparation for discharge home on 2/3.  She was able to transfer to the wheelchair with min assist using the RW and mod demonstrational cueing for hand placement.  She then worked on grooming task of brushing her teeth at the sink with min guard assist as well in standing.  Next, she worked on dressing tasks sit to stand with supervision for UB dressing and min assist for donning pants, and socks.  She needed mod assist to donn shoes, but currently needing max assist for tying them.  Finished session with transition down to the tub room where she practiced tub transfers with use of the tub bench and her spouse assisting.  She was able to complete with min assist and increased time.  Discussed with pt and spouse techniques for showering and then drying off while sitting on the shower seat.  Also educated them on removal of throw rugs throughout and just using a towel to dry her feet of and removing it.  They voiced understanding.  Returned to the room with pt left sitting up in the wheelchair with the call button in place.  Pt still with decreased memory throughout session, asking therapist's the same questions over and over with regards to where he lives and his family.     Therapy Documentation Precautions:  Precautions Precautions: Fall,Other  (comment) Precaution Comments: seizure Restrictions Weight Bearing Restrictions: No   Pain: Pain Assessment Pain Scale: Faces Pain Score: 0-No pain ADL: See Care Tool Section for some details of mobility and selfcare  Therapy/Group: Individual Therapy  Cleophus Mendonsa OTR/L 12/22/2020, 12:39 PM

## 2020-12-22 NOTE — Progress Notes (Signed)
Physical Therapy Session Note  Patient Details  Name: Kristine Francis MRN: [id number] Date of Birth: [birth year]  Today's Date: 12/22/2020 PT Individual Time: 1130-1200 PT Individual Time Calculation (min): 30 min   Short Term Goals: Week 2:  PT Short Term Goal 1 (Week 2): = to LTGs based on ELOS  Skilled Therapeutic Interventions/Progress Updates:    Pt received seated in w/c in room, agreeable to PT session. No complaints of pain. Sit to stand with CGA to RW throughout session. Ambulation x 150 ft, x 200 ft with RW with CGA to min A needed for balance. Pt exhibits ataxic gait pattern with flexed head and trunk, cues for upright posture and safe RW management required. Pt exhibits improved overall balance with cues for posture correction with ongoing ataxia and path deviation. Standing alt L/R cone taps initially with BUE support and CGA for balance, progressing to UUE support on RW but pt unable to perform for more than a few reps before exhibiting inability to control BLE. Standing alt L/R 4" step-taps with RW and CGA initially progressing to UUE support on RW and min A needed for balance. Pt exhibits improved LE control with step-taps vs cone taps. Pt left seated in w/c in room with needs in reach, quick release belt and chair alarm in place, husband present at end of session.  Therapy Documentation Precautions:  Precautions Precautions: Fall,Other (comment) Precaution Comments: seizure Restrictions Weight Bearing Restrictions: No    Therapy/Group: Individual Therapy   Excell Seltzer, PT, DPT  12/22/2020, 12:20 PM

## 2020-12-22 NOTE — Discharge Summary (Incomplete)
Physician Discharge Summary  Patient ID: KHAMYA BOLER MRN: [id number] DOB/AGE: 1941/04/16 80 y.o.  Admit date: 12/10/2020 Discharge date: 12/22/2020  Discharge Diagnoses:  Principal Problem:   Encephalopathy Active Problems:   Pressure injury of skin   Protein-calorie malnutrition, severe   Discharged Condition: {condition:18240}  Significant Diagnostic Studies: MR BRAIN W WO CONTRAST  Result Date: 11/26/2020 CLINICAL DATA:  Seizure, nontraumatic. EXAM: MRI HEAD WITHOUT AND WITH CONTRAST TECHNIQUE: Multiplanar, multiecho pulse sequences of the brain and surrounding structures were obtained without and with intravenous contrast. CONTRAST:  87mL GADAVIST GADOBUTROL 1 MMOL/ML IV SOLN COMPARISON:  Head CT November 25, 2020. FINDINGS: Brain: No acute infarction, hemorrhage, hydrocephalus, extra-axial collection or mass lesion. Remote lacunar infarct in the right basal ganglia region. Scattered foci of T2 hyperintensity are seen within the white matter of the cerebral hemispheres and within the pons, nonspecific, most likely related to chronic small vessel ischemia. Multiple foci of susceptibility artifact are noted in the bilateral cerebral hemispheres, more pronounced in the left occipital region where there is associated subtle T2 hyperintensity of the subcortical white matter and some of the foci appear to be cortical. No focus of abnormal contrast enhancement. Vascular: Normal flow voids. Skull and upper cervical spine: Normal marrow signal. Sinuses/Orbits: Bilateral lens surgery. Paranasal sinuses are essentially clear. IMPRESSION: 1. Multiple foci of susceptibility artifact in the bilateral cerebral hemispheres, more pronounced in the left occipital region where there is associated subtle T2 hyperintensity of the subcortical white matter and some of the foci appear to be cortical. Findings may represent chronic microhemorrhages related to hypertension or amyloid angiopathy. 2. Remote lacunar  infarct in the right basal ganglia region. 3. Scattered foci of T2 hyperintensity within the white matter of the cerebral hemispheres and pons, nonspecific, most likely related to chronic small vessel ischemia. Electronically Signed   By: Pedro Earls M.D.   On: 11/26/2020 15:25   DG Chest Port 1 View  Result Date: 12/02/2020 CLINICAL DATA:  Hypoxia in this 80 year old female EXAM: PORTABLE CHEST 1 VIEW COMPARISON:  November 26, 2020 FINDINGS: Feeding tube passes through in off the field of the radiograph into the upper abdomen. Since January 6th the endotracheal tube is been removed. Cardiac leads project over the patient's chest. Cardiomediastinal contours are stable with dense opacification of the LEFT retrocardiac region. The image is rotated to the LEFT. Increased interstitial markings since the previous study with suggestion of cystic lucencies at the RIGHT lung base perhaps underlying emphysema. Obscured, partially obscured RIGHT hemidiaphragm with graded opacity in the RIGHT chest. Cardiomediastinal contours are stable. No acute skeletal process to the extent evaluated. IMPRESSION: 1. Increasing interstitial markings since the previous study along with patchy airspace opacities findings could represent asymmetric pulmonary edema or multifocal infection. 2. Worsening opacification in the retrocardiac region and at the RIGHT lung base may represent pneumonitis/pneumonia or volume loss 3. Bilateral effusions. 4. Cystic changes the RIGHT lung base, of uncertain significance, potentially related to worsening of interstitial changes overlying pulmonary emphysema. Post infectious pneumatocele or sequela of barotrauma are considered. CT of the chest may be helpful for further evaluation. These results will be called to the ordering clinician or representative by the Radiologist Assistant, and communication documented in the PACS or [employer]. Electronically Signed   By: Zetta Bills M.D.    On: 12/02/2020 09:18   DG CHEST PORT 1 VIEW  Result Date: 11/26/2020 CLINICAL DATA:  Check endotracheal tube placement EXAM: PORTABLE CHEST 1 VIEW COMPARISON:  Film  from earlier in the same day. FINDINGS: Endotracheal tube is noted 17 mm above the carina. This previously lie at approximately 3 cm above the carina. Gastric catheter it is noted extending into the stomach. Cardiac shadow is within normal limits. Decreased aeration of the right lung is noted. This may be related to some bronchial mucosal plugging. Old rib fractures are seen. IMPRESSION: Decreased aeration in the right lung although not felt to be related to endotracheal tube placement as it remains in the mid to distal trachea. No other focal abnormality is noted. Electronically Signed   By: Alcide Clever M.D.   On: 11/26/2020 11:19   DG Chest Port 1 View  Result Date: 11/26/2020 CLINICAL DATA:  Respiratory failure EXAM: PORTABLE CHEST 1 VIEW COMPARISON:  11/25/2020 FINDINGS: Endotracheal tube with tip between the clavicular heads and carina. The enteric tube at least reaches the stomach. Extensive artifact from EKG leads. Hyperinflation. Indistinct patchy right lung opacity. Background scarring and bronchiectasis seen in the apical lungs on CTA of the neck. No visible effusion or pneumothorax. IMPRESSION: 1. Unremarkable hardware positioning. 2. Increased opacity in the right lung associated with presumed right lung aspiration seen on CTA of the neck from yesterday. Electronically Signed   By: Marnee Spring M.D.   On: 11/26/2020 04:34   DG Chest Portable 1 View  Result Date: 11/25/2020 CLINICAL DATA:  Tube placement EXAM: PORTABLE CHEST 1 VIEW COMPARISON:  2013 FINDINGS: Endotracheal tube overlies the midthoracic trachea. Enteric tube passes below the diaphragm with tip out of field of view. Multiple monitoring leads/pads overlie the chest. Hyperinflated lungs with interstitial prominence likely reflecting COPD. No focal consolidation. No  pleural effusion or pneumothorax. Heart size is normal. IMPRESSION: Lines and tubes as above. COPD. No acute process in the chest. Electronically Signed   By: Guadlupe Spanish M.D.   On: 11/25/2020 15:07   DG Swallowing Func-Speech Pathology  Result Date: 12/22/2020 Objective Swallowing Evaluation: Type of Study: MBS-Modified Barium Swallow Study  Patient Details Name: IZZY ARNESON MRN: [id number] Date of Birth: [birth year] Today's Date: 12/22/2020 Past Medical History: Past Medical History: Diagnosis Date . Allergy  . Asthma   sees Dr. Eileen Stanford - as child per pt . At high risk for falls   unstable gait  . Ataxia  . Cataract   removed bilaterally  . Diverticulosis  . Emphysema  . Gait abnormality 05/14/2013  Ms.Kuehl a patient of Dr. Debby Bud presented first interpreter thousand 13 with a gait dysfunction, she also had an episodic confusion and Loss device. Exam found a mild nystagmus and she was referred to ophthalmology on 03-2812, brain MRI was normal nystagmus was a secondary diagnosis to vestibulitis, ear nose and throat has followed and had seen the patient. At the time of the nystagmus the patie . GERD (gastroesophageal reflux disease)  . Hepatic steatosis  . Hyperlipidemia   on atorvastatin  . Hypertension   on losartan  . Iron deficiency anemia, unspecified  . Kidney cysts 11/16/12  small cyst on left . Nystagmus, end-position  . Osteopenia  . Renal cyst  . Stroke ([facility]) 01/2017  mild  Past Surgical History: Past Surgical History: Procedure Laterality Date . ANAL RECTAL MANOMETRY N/A 12/20/2017  Procedure: ANO RECTAL MANOMETRY;  Surgeon: Napoleon Form, MD;  Location: [facility];  Service: Endoscopy;  Laterality: N/A; . BREAST BIOPSY Bilateral 1970's . CATARACT EXTRACTION Bilateral LT:02/14/13,RT:02/21/13 . CERVICAL SPINE SURGERY  2010 . COLONOSCOPY  03-12-14  per Dr. Juanda Chance, diverticulosis only, repeat 5  yrs  . EYE SURGERY Bilateral 01/2013&02/2013  left then right . HERNIA REPAIR  Oct '14-left,  remote-right  years ago right; left done '14 . SKIN CANCER EXCISION  05/13/13  FACE, basal cell . TONSILLECTOMY   HPI: 80 yo female admitted 11/25/20 after becoming unresponsive. [name], unable to use RUE. Intubated in ED. 2 minutes of CPR. MRI Multiple foci of susceptibility artifact in the bilateral cerebral hemispheres more pronounced in occipital region. Remote lacunar infarct in the right basal ganglia region, PMH: cervical fusion 2010, CVA (2018), HTN, HLD, gait abnormality, anemia, emphysema, asthma, GERD, diverticulosis, renal cyst, osteopenia. ETT 1/5-8/22. [facility] 12/10/20.  Subjective: Pt awake, follows directions. Perseverative on calling out for [name] (spouse) Assessment / Plan / Recommendation CHL IP CLINICAL IMPRESSIONS 12/22/2020 Clinical Impression Pt continues to present with acute on chronic primarily pharyngeal dysphagia that is largely unchanged since last MBS (12/04/20). Pt's cervical vertebrae is severely kyphotic with noted cervical hardware (2010). Consistent penetration and silent aspiration (PAS scores 6 and 8) was noted with trials of nectar and thin barium. Cued coughing and throat clearing was ineffective to fully clear aspirates. Vallecular and pyriform sinus residue also noted across textures, but more prominently with thicker textures. Honey thick barium resulted in a more timely initiation of swallow, and no airway intrusion during the swallow. However, on later imaging, a moderate degree of aspiration noted, which SLP suspects was from vallecular residue of honey barium aspirated after the swallow. During additional trials of honey, when pt was cued to perform 2 extra dry swallows, vallecular residue was minimized and no airway intrusion noted. Dysphagia 3 (mechanical soft) textures were masticated efficiently, but did result in significant vallecular residue that was difficult for pt to clear even with extra dry swallows. Given results of today's study, it is  recommended that pt continue her current Dysphagia 2 (minced/chopped) texture diet with honey thick liquids. Medications should be crushed. Pt should perform a minimum of 2 extra dry swallows for every sip/bite. Follow up interventions may consider [place] Water Protocol to assess tolerance of thin with strict oral care considerations, given that pt's dysphagia is partially chronic. Continue per current plan of care. SLP Visit Diagnosis Dysphagia, pharyngeal phase (R13.13) Attention and concentration deficit following -- Frontal lobe and executive function deficit following -- Impact on safety and function Moderate aspiration risk   CHL IP TREATMENT RECOMMENDATION 12/04/2020 Treatment Recommendations Therapy as outlined in treatment plan below   Prognosis 12/04/2020 Prognosis for Safe Diet Advancement (No Data) Barriers to Reach Goals (No Data) Barriers/Prognosis Comment -- CHL IP DIET RECOMMENDATION 12/22/2020 SLP Diet Recommendations Dysphagia 2 (Fine chop) solids;Honey thick liquids Liquid Administration via Cup;No straw Medication Administration Crushed with puree Compensations Slow rate;Small sips/bites;Multiple dry swallows after each bite/sip;Clear throat intermittently;Minimize environmental distractions Postural Changes Remain semi-upright after after feeds/meals (Comment);Seated upright at 90 degrees   CHL IP OTHER RECOMMENDATIONS 12/22/2020 Recommended Consults -- Oral Care Recommendations Oral care BID Other Recommendations Remove water pitcher;Prohibited food (jello, ice cream, thin soups);Order thickener from pharmacy;Have oral suction available   CHL IP FOLLOW UP RECOMMENDATIONS 12/08/2020 Follow up Recommendations Inpatient Rehab   CHL IP FREQUENCY AND DURATION 12/04/2020 Speech Therapy Frequency (ACUTE ONLY) min 2x/week Treatment Duration 2 weeks      CHL IP ORAL PHASE 12/22/2020 Oral Phase Impaired Oral - Pudding Teaspoon -- Oral - Pudding Cup -- Oral - Honey Teaspoon -- Oral - Honey Cup Decreased bolus  cohesion Oral - Nectar Teaspoon NT Oral -  Nectar Cup Decreased bolus cohesion Oral - Nectar Straw -- Oral - Thin Teaspoon NT Oral - Thin Cup Decreased bolus cohesion Oral - Thin Straw -- Oral - Puree -- Oral - Mech Soft WFL Oral - Regular -- Oral - Multi-Consistency -- Oral - Pill -- Oral Phase - Comment --  CHL IP PHARYNGEAL PHASE 12/22/2020 Pharyngeal Phase Impaired Pharyngeal- Pudding Teaspoon -- Pharyngeal -- Pharyngeal- Pudding Cup -- Pharyngeal -- Pharyngeal- Honey Teaspoon -- Pharyngeal -- Pharyngeal- Honey Cup Reduced epiglottic inversion;Reduced airway/laryngeal closure;Penetration/Apiration after swallow;Pharyngeal residue - valleculae;Reduced pharyngeal peristalsis Pharyngeal Material enters airway, passes BELOW cords without attempt by patient to eject out (silent aspiration) Pharyngeal- Nectar Teaspoon NT Pharyngeal -- Pharyngeal- Nectar Cup Reduced pharyngeal peristalsis;Reduced airway/laryngeal closure;Penetration/Aspiration during swallow;Pharyngeal residue - valleculae;Pharyngeal residue - pyriform;Reduced epiglottic inversion Pharyngeal Material enters airway, passes BELOW cords without attempt by patient to eject out (silent aspiration) Pharyngeal- Nectar Straw -- Pharyngeal -- Pharyngeal- Thin Teaspoon NT Pharyngeal -- Pharyngeal- Thin Cup Reduced pharyngeal peristalsis;Penetration/Aspiration during swallow;Reduced airway/laryngeal closure;Reduced epiglottic inversion;Compensatory strategies attempted (with notebox) Pharyngeal Material enters airway, passes BELOW cords without attempt by patient to eject out (silent aspiration) Pharyngeal- Thin Straw -- Pharyngeal -- Pharyngeal- Puree -- Pharyngeal -- Pharyngeal- Mechanical Soft Reduced pharyngeal peristalsis;Pharyngeal residue - valleculae;Reduced epiglottic inversion Pharyngeal -- Pharyngeal- Regular -- Pharyngeal -- Pharyngeal- Multi-consistency -- Pharyngeal -- Pharyngeal- Pill -- Pharyngeal -- Pharyngeal Comment --  CHL IP CERVICAL ESOPHAGEAL  PHASE 12/22/2020 Cervical Esophageal Phase Impaired Pudding Teaspoon -- Pudding Cup -- Honey Teaspoon -- Honey Cup -- Nectar Teaspoon -- Nectar Cup -- Nectar Straw -- Thin Teaspoon -- Thin Cup -- Thin Straw -- Puree -- Mechanical Soft -- Regular -- Multi-consistency -- Pill -- Cervical Esophageal Comment -- Arbutus Leas 12/22/2020, 9:45 AM              DG Swallowing Func-Speech Pathology  Result Date: 12/04/2020 Objective Swallowing Evaluation: Type of Study: MBS-Modified Barium Swallow Study  Patient Details Name: BRADLEY VOORHEIS MRN: [id number] Date of Birth: Oct 22, 1941 Today's Date: 12/04/2020 Time: SLP Start Time (ACUTE ONLY): 1236 -SLP Stop Time (ACUTE ONLY): 1257 SLP Time Calculation (min) (ACUTE ONLY): 21 min Past Medical History: Past Medical History: Diagnosis Date . Allergy  . Asthma   sees Dr. Tiajuana Amass - as child per pt . At high risk for falls   unstable gait  . Ataxia  . Cataract   removed bilaterally  . Diverticulosis  . Emphysema  . Gait abnormality 05/14/2013  Ms.Highsmith a patient of Dr. Linda Hedges presented first interpreter thousand 13 with a gait dysfunction, she also had an episodic confusion and Loss device. Exam found a mild nystagmus and she was referred to ophthalmology on 03-2812, brain MRI was normal nystagmus was a secondary diagnosis to vestibulitis, ear nose and throat has followed and had seen the patient. At the time of the nystagmus the patie . GERD (gastroesophageal reflux disease)  . Hepatic steatosis  . Hyperlipidemia   on atorvastatin  . Hypertension   on losartan  . Iron deficiency anemia, unspecified  . Kidney cysts 11/16/12  small cyst on left . Nystagmus, end-position  . Osteopenia  . Renal cyst  . Stroke ([place]) 01/2017  mild  Past Surgical History: Past Surgical History: Procedure Laterality Date . ANAL RECTAL MANOMETRY N/A 12/20/2017  Procedure: ANO RECTAL MANOMETRY;  Surgeon: Mauri Pole, MD;  Location: [facility];  Service: Endoscopy;  Laterality: N/A; . BREAST BIOPSY  Bilateral 1970's . CATARACT EXTRACTION Bilateral LT:02/14/13,RT:02/21/13 . CERVICAL SPINE SURGERY  2010 . COLONOSCOPY  03-12-14  per Dr. Olevia Perches, diverticulosis only, repeat 5 yrs  . EYE SURGERY Bilateral 01/2013&02/2013  left then right . HERNIA REPAIR  Oct '14-left, remote-right  years ago right; left done '14 . SKIN CANCER EXCISION  05/13/13  FACE, basal cell . TONSILLECTOMY   HPI: 80 yo female admitted 11/25/20 after becoming unresponsive. [name], unable to use RUE. Intubated in ED. 2 minutes of CPR. MRI Multiple foci of susceptibility artifact in the bilateral cerebral hemispheres more pronounced in occipital region. Remote lacunar infarct in the right basal ganglia region, PMH: CVA (2018), HTN, HLD, gait abnormality, anemia, emphysema, asthma, GERD, diverticulosis, renal cyst, osteopenia. ETT 1/5-8/22  Subjective: Pt awake, follows directions. Perseverative on calling out for [name] (spouse) Assessment / Plan / Recommendation CHL IP CLINICAL IMPRESSIONS 12/04/2020 Clinical Impression Pt exhibits an acute dysphagia on chronic cervical anomalies resulting in laryngeal penetration, brief aspiration and pharyngeal residue. Cervical vertebrae is significantly kyphotic with noted cervical hardward (2010). Despite intermittently reduced tongue base retraction and incomplete epiglottic deflection (horizontal position), she is able to protect airway during the swallow in majority of trias. The cohesiveness of bolus with mistimed onset is affected allowing penetration before swallow to vocal cords fallling below briefly. Aggregation of nectar thick to pyriform sinuses with barium squeezed into vestible and below cords briefly. Her chin tuck posture limited cervical ROM. There was insufficient sensation with penetration episodes. Residue was mildly increased with honey thick but not significant and resulted in more timely swallow through pharynx. There is a prominent cricopharyngeus muscle. Recommend Dys 2, honey thick  liquids, multiple swallows and intermittent throat clear and crushed meds. SLP Visit Diagnosis Dysphagia, pharyngeal phase (R13.13) Attention and concentration deficit following -- Frontal lobe and executive function deficit following -- Impact on safety and function Moderate aspiration risk   CHL IP TREATMENT RECOMMENDATION 12/04/2020 Treatment Recommendations Therapy as outlined in treatment plan below   Prognosis 12/04/2020 Prognosis for Safe Diet Advancement (No Data) Barriers to Reach Goals (No Data) Barriers/Prognosis Comment -- CHL IP DIET RECOMMENDATION 12/04/2020 SLP Diet Recommendations Dysphagia 2 (Fine chop) solids;Honey thick liquids Liquid Administration via Cup;No straw Medication Administration Crushed with puree Compensations Slow rate;Small sips/bites;Multiple dry swallows after each bite/sip;Clear throat intermittently Postural Changes Seated upright at 90 degrees   CHL IP OTHER RECOMMENDATIONS 12/04/2020 Recommended Consults -- Oral Care Recommendations Oral care BID Other Recommendations --   CHL IP FOLLOW UP RECOMMENDATIONS 12/04/2020 Follow up Recommendations Inpatient Rehab   CHL IP FREQUENCY AND DURATION 12/04/2020 Speech Therapy Frequency (ACUTE ONLY) min 2x/week Treatment Duration 2 weeks      CHL IP ORAL PHASE 12/04/2020 Oral Phase Impaired Oral - Pudding Teaspoon -- Oral - Pudding Cup -- Oral - Honey Teaspoon -- Oral - Honey Cup -- Oral - Nectar Teaspoon Lingual/palatal residue Oral - Nectar Cup Lingual/palatal residue Oral - Nectar Straw -- Oral - Thin Teaspoon Decreased bolus cohesion Oral - Thin Cup Decreased bolus cohesion Oral - Thin Straw -- Oral - Puree -- Oral - Mech Soft Reduced posterior propulsion Oral - Regular -- Oral - Multi-Consistency -- Oral - Pill -- Oral Phase - Comment --  CHL IP PHARYNGEAL PHASE 12/04/2020 Pharyngeal Phase Impaired Pharyngeal- Pudding Teaspoon -- Pharyngeal -- Pharyngeal- Pudding Cup -- Pharyngeal -- Pharyngeal- Honey Teaspoon -- Pharyngeal -- Pharyngeal-  Honey Cup Pharyngeal residue - pyriform;Pharyngeal residue - valleculae;Reduced epiglottic inversion;Reduced pharyngeal peristalsis Pharyngeal -- Pharyngeal- Nectar Teaspoon Reduced epiglottic inversion;Reduced pharyngeal peristalsis;Delayed swallow initiation-pyriform sinuses;Reduced tongue base retraction Pharyngeal Material  does not enter airway Pharyngeal- Nectar Cup Pharyngeal residue - valleculae;Penetration/Aspiration during swallow;Pharyngeal residue - pyriform;Reduced pharyngeal peristalsis;Reduced tongue base retraction;Reduced epiglottic inversion Pharyngeal Material enters airway, passes BELOW cords then ejected out;Material enters airway, CONTACTS cords and not ejected out Pharyngeal- Nectar Straw -- Pharyngeal -- Pharyngeal- Thin Teaspoon Reduced epiglottic inversion;Reduced pharyngeal peristalsis Pharyngeal -- Pharyngeal- Thin Cup Reduced pharyngeal peristalsis;Reduced epiglottic inversion;Penetration/Aspiration before swallow Pharyngeal Material enters airway, passes BELOW cords then ejected out;Material enters airway, CONTACTS cords and not ejected out Pharyngeal- Thin Straw -- Pharyngeal -- Pharyngeal- Puree -- Pharyngeal -- Pharyngeal- Mechanical Soft Penetration/Aspiration during swallow;Reduced epiglottic inversion Pharyngeal -- Pharyngeal- Regular -- Pharyngeal -- Pharyngeal- Multi-consistency -- Pharyngeal -- Pharyngeal- Pill -- Pharyngeal -- Pharyngeal Comment --  CHL IP CERVICAL ESOPHAGEAL PHASE 12/04/2020 Cervical Esophageal Phase Impaired Pudding Teaspoon -- Pudding Cup -- Honey Teaspoon -- Honey Cup -- Nectar Teaspoon -- Nectar Cup -- Nectar Straw -- Thin Teaspoon -- Thin Cup -- Thin Straw -- Puree -- Mechanical Soft -- Regular -- Multi-consistency -- Pill -- Cervical Esophageal Comment -- [name] 12/04/2020, 2:25 PM Orbie Pyo Litaker M.Ed Actor Pager [phone] Office [phone]              EEG adult  Result Date: 11/26/2020 [name],  MD     11/26/2020  9:57 AM Patient Name: MICHAELENE DUTAN MRN: [id number] Epilepsy Attending: [name] Referring Physician/Provider: Dr. Donnetta Simpers Date: 11/25/2020 Duration: 24.22 minutes Patient history: 80 year old female with new onset seizures.  EEG to evaluate for seizures. Level of alertness: comatose AEDs during EEG study: Keppra Technical aspects: This EEG study was done with scalp electrodes positioned according to the 10-20 International system of electrode placement. Electrical activity was acquired at a sampling rate of 500Hz  and reviewed with a high frequency filter of 70Hz  and a low frequency filter of 1Hz . EEG data were recorded continuously and digitally stored. Description: EEG showed continuous generalized 3 to 6 Hz theta-delta slowing admixed with 15 to 18 Hz generalized beta activity.  Hyperventilation and photic stimulation were not performed.   ABNORMALITY -Continuous slow, generalized IMPRESSION: This study is suggestive of moderate to severe diffuse encephalopathy, nonspecific etiology.  No seizures or epileptiform discharges were seen throughout the recording. Priyanka Barbra Sarks   Overnight EEG with video  Result Date: 11/26/2020 [name], MD     11/26/2020  1:07 PM Patient Name: MYEISHA KRUSER MRN: [id number] Epilepsy Attending: [name] Referring Physician/Provider: Dr. Donnetta Simpers Duration:  11/25/2020 1723 to 11/26/2020 1228  Patient history: 80 year old female with new onset seizures.  EEG to evaluate for seizures.  Level of alertness: comatose  AEDs during EEG study: Keppra  Technical aspects: This EEG study was done with scalp electrodes positioned according to the 10-20 International system of electrode placement. Electrical activity was acquired at a sampling rate of 500Hz  and reviewed with a high frequency filter of 70Hz  and a low frequency filter of 1Hz . EEG data were recorded continuously and digitally stored.  Description: EEG showed continuous  generalized 3 to 6 Hz theta-delta slowing admixed with 15 to 18 Hz generalized beta activity.  Sharp transients were seen in left frontotemporal region.  Hyperventilation and photic stimulation were not performed.    ABNORMALITY -Continuous slow, generalized  IMPRESSION: This study is suggestive of moderate to severe diffuse encephalopathy, nonspecific etiology.  No seizures or definite epileptiform discharges were seen throughout the recording.  [name]   ECHOCARDIOGRAM COMPLETE  Result Date: 11/27/2020  ECHOCARDIOGRAM REPORT   Patient Name:   [name] Date of Exam: 11/27/2020 Medical Rec #:  [id number]        Height:       61.0 in Accession #:    [id number]       Weight:       87.7 lb Date of Birth:  October 23, 1941        BSA:          1.331 m Patient Age:    5 years         BP:           157/88 mmHg Patient Gender: F                HR:           69 bpm. Exam Location:  Inpatient Procedure: 2D Echo, 3D Echo, Cardiac Doppler and Color Doppler Indications:    Cardiac arrest  History:        Patient has prior history of Echocardiogram examinations, most                 recent 02/20/2015. Arrythmias:Cardiac Arrest, Signs/Symptoms:Chest                 Pain; Risk Factors:Hypertension. Hypoxemia.  Sonographer:    Roseanna Rainbow RDCS Referring Phys: [id number] [facility]  Sonographer Comments: Technically difficult study due to poor echo windows and echo performed with patient supine and on artificial respirator. Low windows, no true parasternal. IMPRESSIONS  1. Left ventricular ejection fraction, by estimation, is 60 to 65%. The left ventricle has normal function. The left ventricle has no regional wall motion abnormalities. There is mild left ventricular hypertrophy. Left ventricular diastolic parameters are consistent with Grade II diastolic dysfunction (pseudonormalization).  2. Right ventricular systolic function is normal. The right ventricular size is normal. There is normal pulmonary artery systolic  pressure. The estimated right ventricular systolic pressure is 99991111 mmHg.  3. The mitral valve is normal in structure. No evidence of mitral valve regurgitation. No evidence of mitral stenosis.  4. The aortic valve is tricuspid. Aortic valve regurgitation is not visualized. Mild aortic valve sclerosis is present, with no evidence of aortic valve stenosis.  5. The inferior vena cava is dilated in size with <50% respiratory variability, suggesting right atrial pressure of 15 mmHg.  6. Small pericardial effusion along the RV. FINDINGS  Left Ventricle: Left ventricular ejection fraction, by estimation, is 60 to 65%. The left ventricle has normal function. The left ventricle has no regional wall motion abnormalities. The left ventricular internal cavity size was normal in size. There is  mild left ventricular hypertrophy. Left ventricular diastolic parameters are consistent with Grade II diastolic dysfunction (pseudonormalization). Right Ventricle: The right ventricular size is normal. No increase in right ventricular wall thickness. Right ventricular systolic function is normal. There is normal pulmonary artery systolic pressure. The tricuspid regurgitant velocity is 2.20 m/s, and  with an assumed right atrial pressure of 15 mmHg, the estimated right ventricular systolic pressure is 99991111 mmHg. Left Atrium: Left atrial size was normal in size. Right Atrium: Right atrial size was normal in size. Pericardium: Small pericardial effusion along the RV. Mitral Valve: The mitral valve is normal in structure. No evidence of mitral valve regurgitation. No evidence of mitral valve stenosis. Tricuspid Valve: The tricuspid valve is normal in structure. Tricuspid valve regurgitation is trivial. Aortic Valve: The aortic valve is tricuspid. Aortic valve regurgitation is not visualized. Mild aortic valve sclerosis  is present, with no evidence of aortic valve stenosis. Pulmonic Valve: The pulmonic valve was normal in structure. Pulmonic  valve regurgitation is not visualized. Aorta: The aortic root is normal in size and structure. Venous: The inferior vena cava is dilated in size with less than 50% respiratory variability, suggesting right atrial pressure of 15 mmHg. IAS/Shunts: No atrial level shunt detected by color flow Doppler.  LEFT VENTRICLE PLAX 2D LVIDd:         3.15 cm     Diastology LVIDs:         2.40 cm     LV e' medial:    5.70 cm/s LV PW:         1.20 cm     LV E/e' medial:  15.3 LV IVS:        1.10 cm     LV e' lateral:   5.63 cm/s LVOT diam:     1.90 cm     LV E/e' lateral: 15.5 LV SV:         52 LV SV Index:   39 LVOT Area:     2.84 cm  LV Volumes (MOD) LV vol d, MOD A2C: 42.6 ml LV vol d, MOD A4C: 17.8 ml LV vol s, MOD A2C: 12.7 ml LV vol s, MOD A4C: 5.2 ml LV SV MOD A2C:     29.9 ml LV SV MOD A4C:     17.8 ml LV SV MOD BP:      19.5 ml RIGHT VENTRICLE             IVC RV S prime:     21.30 cm/s  IVC diam: 2.50 cm TAPSE (M-mode): 1.6 cm LEFT ATRIUM             Index      RIGHT ATRIUM          Index LA diam:        3.30 cm 2.48 cm/m RA Area:     5.82 cm LA Vol (A2C):   8.7 ml  6.52 ml/m RA Volume:   9.31 ml  7.00 ml/m LA Vol (A4C):   9.8 ml  7.36 ml/m LA Biplane Vol: 9.2 ml  6.92 ml/m  AORTIC VALVE LVOT Vmax:   115.00 cm/s LVOT Vmean:  77.500 cm/s LVOT VTI:    0.182 m  AORTA Ao Root diam: 3.40 cm Ao Asc diam:  2.80 cm MITRAL VALVE               TRICUSPID VALVE MV Area (PHT): 3.08 cm    TR Peak grad:   19.4 mmHg MV Decel Time: 246 msec    TR Vmax:        220.00 cm/s MV E velocity: 87.00 cm/s MV A velocity: 63.00 cm/s  SHUNTS MV E/A ratio:  1.38        Systemic VTI:  0.18 m                            Systemic Diam: 1.90 cm Loralie Champagne MD Electronically signed by Loralie Champagne MD Signature Date/Time: 11/27/2020/12:52:26 PM    Final    CT HEAD CODE STROKE WO CONTRAST  Result Date: 11/25/2020 CLINICAL DATA:  Code stroke.  Right-sided weakness EXAM: CT HEAD WITHOUT CONTRAST TECHNIQUE: Contiguous axial images were obtained from  the base of the skull through the vertex without intravenous contrast. COMPARISON:  Correlation made with MRI brain 2018 FINDINGS: Mild motion  artifact is present. Brain: There is no acute intracranial hemorrhage, mass effect, or edema. Gray-white differentiation is preserved. 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 Additional patchy hypoattenuation in the supratentorial white matter is nonspecific but probably reflects mild chronic microvascular ischemic changes. Prominence of the ventricles and sulci reflects minor generalized parenchymal volume loss. No extra-axial collection. Vascular: No hyperdense vessel. There is mild intracranial atherosclerotic calcification at the skull base. Skull: Unremarkable. Sinuses/Orbits: Aerated.  No acute abnormality of the orbits. Other: Mastoid air cells are clear. ASPECTS (Woodman Stroke Program Early CT Score) - Ganglionic level infarction (caudate, lentiform nuclei, internal capsule, insula, M1-M3 cortex): 7 - Supraganglionic infarction (M4-M6 cortex): 3 Total score (0-10 with 10 being normal): 10 IMPRESSION: There is no acute intracranial hemorrhage or evidence of acute infarction. ASPECT score is 10. These results were communicated to Dr. Lorrin Goodell at 3:21 pm on 11/25/2020 by text page via the [facility] messaging system. Electronically Signed   By: Macy Mis M.D.   On: 11/25/2020 15:25   CT ANGIO HEAD CODE STROKE  Result Date: 11/25/2020 CLINICAL DATA:  Stroke/TIA.  Right-sided weakness and right gaze EXAM: CT ANGIOGRAPHY HEAD AND NECK TECHNIQUE: Multidetector CT imaging of the head and neck was performed using the standard protocol during bolus administration of intravenous contrast. Multiplanar CT image reconstructions and MIPs were obtained to evaluate the vascular anatomy. Carotid stenosis measurements (when applicable) are obtained utilizing NASCET criteria, using the distal internal carotid diameter as the denominator.  CONTRAST:  42mL OMNIPAQUE IOHEXOL 350 MG/ML SOLN COMPARISON:  MRA January 26, 2017. FINDINGS: CTA NECK FINDINGS Aortic arch: Great vessel origins are patent. Right carotid system: No evidence of dissection, stenosis (50% or greater) or occlusion. Tortuous ICA at the skull base. Left carotid system: No evidence of dissection, stenosis (50% or greater) or occlusion. Vertebral arteries: Codominant. No evidence of dissection, stenosis (50% or greater) or occlusion. Skeleton: ACDF spanning C4 through C7. Other neck: 1.3 cm nodule within the right thyroid, which does not require further imaging follow-up per current ACR guidelines. Otherwise, no wmass or suspicious adenopathy. Upper chest: Partially imaged consolidation in the dependent lungs bilaterally. Biapical pleuroparenchymal scarring. Diffuse bronchial wall thickening. Fluid in the stomach. Endotracheal tube terminates above the carina. Debris in the right mainstem bronchus and esophagus. Review of the MIP images confirms the above findings CTA HEAD FINDINGS Anterior circulation: No significant stenosis, proximal occlusion, aneurysm, or vascular malformation. Posterior circulation: No significant stenosis, proximal occlusion, aneurysm, or vascular malformation. Right fetal type PCA. Venous sinuses: Poorly visualized given arterial timing. Anatomic variants: Right fetal type PCA. Review of the MIP images confirms the above findings IMPRESSION: 1. No large vessel occlusion or proximal hemodynamically significant stenosis intracranially or in the neck. 2. Partially imaged consolidation in the dependent lungs bilaterally, likely aspiration given debris in the right mainstem bronchus, distal bronchi, and the esophagus. Electronically Signed   By: Margaretha Sheffield MD   On: 11/25/2020 16:04   CT ANGIO NECK CODE STROKE  Result Date: 11/25/2020 CLINICAL DATA:  Stroke/TIA.  Right-sided weakness and right gaze EXAM: CT ANGIOGRAPHY HEAD AND NECK TECHNIQUE: Multidetector CT  imaging of the head and neck was performed using the standard protocol during bolus administration of intravenous contrast. Multiplanar CT image reconstructions and MIPs were obtained to evaluate the vascular anatomy. Carotid stenosis measurements (when applicable) are obtained utilizing NASCET criteria, using the distal internal carotid diameter as the denominator. CONTRAST:  75mL OMNIPAQUE IOHEXOL 350 MG/ML SOLN COMPARISON:  MRA January 26, 2017. FINDINGS: CTA NECK FINDINGS Aortic arch: Great vessel origins are patent. Right carotid system: No evidence of dissection, stenosis (50% or greater) or occlusion. Tortuous ICA at the skull base. Left carotid system: No evidence of dissection, stenosis (50% or greater) or occlusion. Vertebral arteries: Codominant. No evidence of dissection, stenosis (50% or greater) or occlusion. Skeleton: ACDF spanning C4 through C7. Other neck: 1.3 cm nodule within the right thyroid, which does not require further imaging follow-up per current ACR guidelines. Otherwise, no wmass or suspicious adenopathy. Upper chest: Partially imaged consolidation in the dependent lungs bilaterally. Biapical pleuroparenchymal scarring. Diffuse bronchial wall thickening. Fluid in the stomach. Endotracheal tube terminates above the carina. Debris in the right mainstem bronchus and esophagus. Review of the MIP images confirms the above findings CTA HEAD FINDINGS Anterior circulation: No significant stenosis, proximal occlusion, aneurysm, or vascular malformation. Posterior circulation: No significant stenosis, proximal occlusion, aneurysm, or vascular malformation. Right fetal type PCA. Venous sinuses: Poorly visualized given arterial timing. Anatomic variants: Right fetal type PCA. Review of the MIP images confirms the above findings IMPRESSION: 1. No large vessel occlusion or proximal hemodynamically significant stenosis intracranially or in the neck. 2. Partially imaged consolidation in the dependent lungs  bilaterally, likely aspiration given debris in the right mainstem bronchus, distal bronchi, and the esophagus. Electronically Signed   By: Margaretha Sheffield MD   On: 11/25/2020 16:04    Labs:  Basic Metabolic Panel: Recent Labs  Lab 12/21/20 0509  NA 134*  K 4.1  CL 98  CO2 28  GLUCOSE 95  BUN 15  CREATININE 0.73  CALCIUM 9.0    CBC: Recent Labs  Lab 12/21/20 0509  WBC 6.6  HGB 8.9*  HCT 27.3*  MCV 91.6  PLT 445*    CBG: No results for input(s): GLUCAP in the last 168 hours.  Brief HPI:   MYLEY TREHARNE is a 80 y.o. female ***   Hospital Course: LASHARRA LUNDY was admitted to rehab 12/10/2020 for inpatient therapies to consist of PT, ST and OT at least three hours five days a week. Past admission physiatrist, therapy team and rehab RN have worked together to provide customized collaborative inpatient rehab.   Blood pressures were monitored on TID basis and   Diabetes has been monitored with ac/hs CBG checks and SSI was use prn for tighter BS control.    Rehab course: During patient's stay in rehab weekly team conferences were held to monitor patient's progress, set goals and discuss barriers to discharge. At admission, patient required  She  has had improvement in activity tolerance, balance, postural control as well as ability to compensate for deficits.      Disposition:  There are no questions and answers to display.         Diet:  Special Instructions:  Discharge Instructions    Ambulatory referral to Neurology   Complete by: As directed    An appointment is requested in approximately: 2-3 weeks. From 12/24/20.  New onset seizures   Ambulatory referral to Physical Medicine Rehab   Complete by: As directed    1-2 weeks follow up appt     Allergies as of 12/22/2020      Reactions   Contrast Media [iodinated Diagnostic Agents] Other (See Comments)   Congestion .Marland KitchenMarland Kitchenpatient stated it irritated her eyes and voice   Iohexol Other (See Comments)    Gi upset   Lisinopril Cough    Med Rec must be completed prior to  using this Magna***       Follow-up Information    Kirsteins, Luanna Salk, MD Follow up.   Specialty: Physical Medicine and Rehabilitation Why: Office will call you with follow up appointment Contact information: [place] [address] [phone]        [name], MD. Call.   Specialty: Family Medicine Why: for post hospital follow up Contact information: [place] [address] [phone]        [place] Follow up.   Why: Office will call you with follow up appointment Contact information: [address]     [place] [address] [phone]              Signed: Bary Leriche 12/22/2020, 4:21 PM

## 2020-12-22 NOTE — Progress Notes (Signed)
Modified Barium Swallow Progress Note  Patient Details  Name: Kristine Francis MRN: [id number] Date of Birth: 01/12/1941  Today's Date: 12/22/2020  Modified Barium Swallow completed.  Full report located under Chart Review in the Imaging Section.  Brief recommendations include the following:  Clinical Impression  Pt continues to present with acute on chronic primarily pharyngeal dysphagia that is largely unchanged since last MBS (12/04/20). Pt's cervical vertebrae is severely kyphotic with noted cervical hardware (2010). Consistent penetration and silent aspiration (PAS scores 6 and 8) was noted with trials of nectar and thin barium. Cued coughing and throat clearing was ineffective to fully clear aspirates. Vallecular and pyriform sinus residue also noted across textures, but more prominently with thicker textures. Honey thick barium resulted in a more timely initiation of swallow, and no airway intrusion during the swallow. However, on later imaging, a moderate degree of aspiration noted, which SLP suspects was from vallecular residue of honey barium aspirated after the swallow. During additional trials of honey, when pt was cued to perform 2 extra dry swallows, vallecular residue was minimized and no airway intrusion noted. Dysphagia 3 (mechanical soft) textures were masticated efficiently, but did result in significant vallecular residue that was difficult for pt to clear even with extra dry swallows. Given results of today's study, it is recommended that pt continue her current Dysphagia 2 (minced/chopped) texture diet with honey thick liquids. Medications should be crushed. Pt should perform a minimum of 2 extra dry swallows for every sip/bite. Follow up interventions may consider Frasier Water Protocol to assess tolerance of thin with strict oral care considerations, given that pt's dysphagia is partially chronic. Continue per current plan of care.    Swallow Evaluation Recommendations        SLP Diet Recommendations: Dysphagia 2 (Fine chop) solids;Honey thick liquids   Liquid Administration via: Cup;No straw   Medication Administration: Crushed with puree   Supervision: Patient able to self feed;Full supervision/cueing for compensatory strategies   Compensations: Slow rate;Small sips/bites;Multiple dry swallows after each bite/sip;Clear throat intermittently;Minimize environmental distractions   Postural Changes: Remain semi-upright after after feeds/meals (Comment);Seated upright at 90 degrees   Oral Care Recommendations: Oral care BID   Other Recommendations: Remove water pitcher;Prohibited food (jello, ice cream, thin soups);Order thickener from pharmacy;Have oral suction available    Arbutus Leas 12/22/2020,9:43 AM

## 2020-12-22 NOTE — Progress Notes (Signed)
Physical Therapy Session Note  Patient Details  Name: Kristine Francis MRN: [id number] Date of Birth: 1941/04/01  Today's Date: 12/22/2020 PT Individual Time: 8250-5397 PT Individual Time Calculation (min): 65 min   Short Term Goals: Week 2:  PT Short Term Goal 1 (Week 2): = to LTGs based on ELOS  Skilled Therapeutic Interventions/Progress Updates:    Pt received in supported sitting in bed with her husband, [name], present and pt agreeable to therapy session. Therapy session focused on hands-on family education/training in preparation for upcoming D/C. Supine>sitting R EOB, HOB flat and not using bedrails per home set-up with supervision. Educated husband on proper use of gait belt to assist pt with mobility and educated pt that she requires hands-on assistance for all mobility and must ensure her husband is assisting when she goes to stand. Discussed pros/cons of wheelchair vs transport chair and decided it would be most appropriate for pt to have 16x16in wheelchair for safety during community mobility to/from therapy and MD appointments - pt and husband in agreement with this plan. R stand pivot EOB>w/c using RW with pt's husband providing proper hands-on assistance.with cuing from therapist. Gait training ~137ft to main therapy gym using RW with therapist initially providing hands-on assistance while educating husband on proper positioning and hand placement - transitioned to husband providing assistance with min cuing from therapist - pt continues to demo wide BOS, ataxic gait with decreased B LE hip/knee flexion during swing, and increased trunk flexion requiring cuing for improvements and to maintain close AD management. Discussed pt having 1 step-up into house - therapist demonstrated proper technique/sequencig using RW. Pt stepped up/down on/off 8" step using RW with min assist for balance and assist for RW placement on/off step 2x with therapist - repeated 4x with husband providing hands-on  assistance with mod progressed to min cuing from therapist - determined that pt is most safe stepping up with R LE and down with L LE - pt's husband does a great job of cuing pt through sequence of AD management and LE stepping. Gait training ~168ft using RW with pt's husband providing hands-on assistance - pt demos increased R veering at this time and poorer AD management likely due to fatigue - therapist cues husband to avoid pulling up on gait belt while pt ambulating as this increases her instability. Simulated car transfer using RW (SUV height) with pt's husband providing min assist and therapist providing max cuing for sequencing and safe set-up. Pt's husband planning to take measurement of car seat height to allow more real-life simulation environment. Gait ~142ft back to room using RW with pt's husband providing min assist - therapist continuing to provide min cuing for relaxed but firm grasp on gait belt and continued to encourage pt's husband to cue her for correcting gait deviations to increase pt safety. Pt requesting to return to bed. Sit>supine supervision. Pt left supine in bed with needs in reach, bed alarm on, and her husband present.  Therapy Documentation Precautions:  Precautions Precautions: Fall,Other (comment) Precaution Comments: seizure Restrictions Weight Bearing Restrictions: No  Pain: No reports of pain throughout session.   Therapy/Group: Individual Therapy  Tawana Scale , PT, DPT, CSRS  12/22/2020, 3:30 PM

## 2020-12-22 NOTE — Progress Notes (Signed)
Nutrition Follow-up  DOCUMENTATION CODES:   Underweight,Severe malnutrition in context of acute illness/injury  INTERVENTION:   -Continue Magic cup TID with meals, each supplement provides 290 kcal and 9 grams of protein -Continue MVI with minerals daily -Continue Ensure Max po daily, each supplement provides 150 kcal and 30 grams of protein.   NUTRITION DIAGNOSIS:   Severe Malnutrition related to acute illness (pneumonia requiring intubation, dysphagia) as evidenced by moderate fat depletion,severe muscle depletion.  Ongoing  GOAL:   Patient will meet greater than or equal to 90% of their needs  Progressing   MONITOR:   PO intake,Supplement acceptance,Diet advancement,Labs,Weight trends,Skin  REASON FOR ASSESSMENT:   Other (Comment) (underweight BMI)    ASSESSMENT:   80 year old female with PMH of HTN, asthma, hepatic steatosis, vestibulitis, CVA who was admitted on 11/25/20 with right-sided weakness, right gaze preference, and difficulty speaking. In the ED pt had difficulty handling oral secretions and had tonic clonic seizure and loss of pulse. Pt required intubation for airway protection. MRI brain done revealing multiple foci of susceptibility in bilateral cerebral hemispheres more pronounced in left occipital region question chronic microhemorrhages. Pt was extubated to 4 L oxygen by 1/8. Admitted to CIR on 1/21.  2/1- s/p MBSS-continue to recommend dysphagia 2 diet with honey thick liquids  Reviewed I/O's: +740 ml x 24 hours and +2.1 L since admission  Pt being set up for lunch at time of visit. Noted meal completions have improved (PO 40-100%). She is consuming Ensure Max supplements.   Per MD notes, noted potential plan to discharge home on 2.4.22.  Medications reviewed and include Keppra.   Labs reviewed: Na: 134.   Diet Order:   Diet Order            DIET DYS 2 Room service appropriate? No; Fluid consistency: Honey Thick  Diet effective now                  EDUCATION NEEDS:   Education needs have been addressed  Skin:  Skin Assessment: Skin Integrity Issues: Skin Integrity Issues:: Stage II Stage II: sacrum  Last BM:  12/22/20  Height:   Ht Readings from Last 1 Encounters:  12/10/20 5\' 1"  (1.549 m)    Weight:   Wt Readings from Last 1 Encounters:  12/22/20 40.2 kg   BMI:  Body mass index is 16.75 kg/m.  Estimated Nutritional Needs:   Kcal:  1500-1700  Protein:  70-85 grams  Fluid:  1.5-1.7 L    Loistine Chance, RD, LDN, [place] Registered Dietitian II Certified Diabetes Care and Education Specialist Please refer to [facility] for RD and/or RD on-call/weekend/after hours pager

## 2020-12-23 LAB — OCCULT BLOOD X 1 CARD TO LAB, STOOL: Fecal Occult Bld: NEGATIVE

## 2020-12-23 MED ORDER — MELATONIN 3 MG PO TABS
3.0000 mg | ORAL_TABLET | Freq: Every day | ORAL | Status: DC
  Administered 2020-12-23: 3 mg via ORAL
  Filled 2020-12-23: qty 1

## 2020-12-23 NOTE — Patient Care Conference (Addendum)
Inpatient RehabilitationTeam Conference and Plan of Care Update Date: 12/23/2020   Time: 10:38 AM    Patient Name: Kristine Francis      Medical Record Number: [id number]  Date of Birth: 07-Nov-1941 Sex: Female         Room/Bed: 4M08C/4M08C-01 Payor Info: Payor: MEDICARE / Plan: MEDICARE PART A AND B / Product Type: *No Product type* /    Admit Date/Time:  12/10/2020  2:23 PM  Primary Diagnosis:  Encephalopathy  Hospital Problems: Principal Problem:   Encephalopathy Active Problems:   Pressure injury of skin   Protein-calorie malnutrition, severe    Expected Discharge Date: Expected Discharge Date: 12/24/20  Team Members Present: Physician leading conference: Dr. [name] Care Coodinator Present: Dorien Chihuahua, RN, BSN, CRRN;Christina Sampson Goon, [place] Nurse Present: Judee Clara, LPN PT Present: Page Spiro, PT OT Present: Clyda Greener, OT SLP Present: Jettie Booze, CF-SLP PPS Coordinator present : Gunnar Fusi, Novella Olive, PT     Current Status/Progress Goal Weekly Team Focus  Bowel/Bladder   contineent/ incontinet  of B/B, last BM-2/1. Has difficulty voiding. Have pt double void prior cath.PVR q 6 hours, I/O >350cc.  regain continence and able to void  assess q 3-4 hours to use toilet , while awake   Swallow/Nutrition/ Hydration   dys 2 textures, honey thick liquids, Min A use of compensatory swallow strategies (no change in pharyngeal function on MBS 2/1)  Supervision  pharyngeal strengthening exercises, water protocol, education about current recommendations, carryover swallow strategies   ADL's   Supervision for UB selfcare with min assist for most LB selfcare except tying shoes (max assist).  Transfers to the toilet are min assist as well as the tub shower with use of the RW.  Decreased memory as well as decreased RUE FM coordination  supervision to min assist.  selfcare retraining, transfer training, balance retraining, cognitive retraining, DME education,  neuromuscular re-educatiion, pt/family education   Mobility   supervision bed mobility, CGA/min assist sit<>stand and stand pivot transfers using RW, CGA/min assist gait up to 266f using RW, min assist 1 step using RW, continues to require up to max assist for dynamic standing balance without UE support due to significant ataxia with impaired motor control and coordination  supervision to CGA at ambulatory level  activity tolerance, static and dynamic standing balance, gait training, pt/family education, transfer training, stair navigation, discharge planning, B LE NMR and coordination   Communication   Mod I  Mod I  intelligibility goals met   Safety/Cognition/ Behavioral Observations  Mod-Max short term memory, Min A problem solving, awareness  Supervision-Min  functional carryover new information and memory strategies, intellectual and emergent awareness, functional problem solving, education   Pain   denies pain  pain<3  assess pain q shift and PRN   Skin   intact  remain intact  assess skin q shift and prn     Discharge Planning:  Discharging home with spouse and son able to provide 24/7 at MOD A level   Team Discussion: Confused at times, unsteady gait; requires cues for use of adaptive equipment. Swallowing not improved since last week. Progress limited by decreased coordination of right upper extremity, poor carryover and STMD with poor memory, poor balance recovery, trunchal ataxia and fatigue.  Patient on target to meet rehab goals: no, goals downgraded. Currently supervision for upper body care and min assist for bathing with mod assist for dressing. Able to transfer CGA- min assist depending on fatigue level and can ambulate up to  200'.  *See Care Plan and progress notes for long and short-term goals.   Revisions to Treatment Plan:  Downgraded goals No change in diet post MBS; keep on D2 honey thick diet Teaching Needs: Strategies for swallowing, safety, medications,  transfers, toileting, etc.  Current Barriers to Discharge: Decreased caregiver support and Behavior, home environment; steps to entry of home  Possible Resolutions to Barriers: Family education OP follow up services recommended    Medical Summary Current Status: Poor memory as well as occasional confusion, some sundowning issues at night.  Dysphagia has not improved  Barriers to Discharge: Medical stability;Other (comments)  Barriers to Discharge Comments: Dysphagia continues without improvement thus far Possible Resolutions to Barriers/Weekly Focus: Family teaching prior to discharge, simplify medication regimen, avoid anticholinergic medications   Continued Need for Acute Rehabilitation Level of Care: The patient requires daily medical management by a physician with specialized training in physical medicine and rehabilitation for the following reasons: Direction of a multidisciplinary physical rehabilitation program to maximize functional independence : Yes Medical management of patient stability for increased activity during participation in an intensive rehabilitation regime.: Yes Analysis of laboratory values and/or radiology reports with any subsequent need for medication adjustment and/or medical intervention. : Yes   I attest that I was present, lead the team conference, and concur with the assessment and plan of the team.   Dorien Chihuahua B 12/23/2020, 2:20 PM

## 2020-12-23 NOTE — Progress Notes (Signed)
Physical Therapy Discharge Summary  Patient Details  Name: Kristine Francis MRN: [id number] Date of Birth: December 19, 1940  Today's Date: 12/23/2020 PT Individual Time: 7829-5621 PT Individual Time Calculation (min): 79 min    Patient has met 4 of 10 long term goals due to improved activity tolerance, improved balance, improved postural control, increased strength, ability to compensate for deficits, functional use of  right upper extremity and right lower extremity, improved attention, improved awareness and improved coordination.  Patient to discharge at an ambulatory level using RW with CGA/Min Assist.   Patient's care partner attended hands-on education/training and is independent to provide the necessary physical and cognitive assistance at discharge.  Reasons goals not met: Due to pt's significant truncal and LE ataxia she continues to require CGA and/or min assist for most standing/ambulating tasks.   Recommendation:  Patient will benefit from ongoing skilled PT services in outpatient setting to continue to advance safe functional mobility, address ongoing impairments in dynamic standing balance, transfer training, gait training, activity tolerance, B LE strengthening, and minimize fall risk.  Equipment: 16x16 wheelchair (pt already has RW)  Reasons for discharge: treatment goals met and discharge from hospital  Patient/family agrees with progress made and goals achieved: Yes   Skilled Therapeutic Interventions/Progress Updates:  Pt received sitting in w/c with her husband, Kristine Francis, present and pt agreeable to therapy session. Session focused on reinforcing hands-on training and education performed during yesterday's session with pt and husband.  Transported to/from gym in w/c for time management and energy conservation. Simulated car transfer (SUV height) using RW 2x with pt's husband setting up w/c and assisting pt with min progressed to no cuing from therapist -pt requires min assist for  safety getting in/out of vehicle due to the seat height. Gait training ~86f into and through ADL apartment 2x with pt's husband providing hands-on assistance to simulate more home environment - min progressed to no cuing from therapist for proper positioning to assist pt and cuing to give pt for increased safety with AD - during 1st walk pt demos worsening AD management in this unfamiliar environment requiring increasing cues though significantly improved during 2nd walk with more familiarity with environment and the task; educated pt's husband on this likely occurring at home as well. Pt's husband reports family having already assisted with preparing safe set-up of home environment for pt to ambulate using RW with his assistance. Block practice stepping up/down on/off 1 8" step using RW to simulate home entry 2x2reps with pt's husband providing all hands-on assistance requiring min cuing 1st time but then no cuing remainder of trials with pt's husband providing pt good sequential cuing for how to manage AD and LE stepping safely. Had pt's husband manage w/c parts throughout session with min progressed to no cuing. Pt/husband report no questions concerns regarding hands-on training and pt's husband reports feeling confident/comfortable assisting pt at home. Therapist provided pt with a walker bag. [facility] increased fall risk as noted by score of  12/56.  (<36= high risk for falls, close to 100%; 37-45 significant >80%; 46-51 moderate >50%; 52-55 lower >25%). Participated in Timed Up and Go (TUG): 88 seconds, demonstrating high fall risk as indicated by requiring >13.5seconds to complete assessment. Discussed the results of these outcome measures with pt and husband. Therapist provided pt with HEP including the following exercises to be performed with husband's assistance:  - repeated sit<>stands using RW from an arm chair working on LE strengthening and motor  planning 2x10reps - standing heel raises using RW or counter support 2x10 reps - static narrow BOS stance for 30 seconds targeting balance Pt's husband reports no questions about these exercises. Transported pt back to room and left sitting in w/c with needs in reach, seat belt alarm on, and her husband present.  PT Discharge Precautions/Restrictions Precautions Precautions: Fall;Other (comment) Precaution Comments: seizure, ataxia Restrictions Weight Bearing Restrictions: No Pain Pain Assessment Pain Scale: 0-10 Pain Score: 0-No pain Perception  Perception Perception: Within Functional Limits Praxis Praxis: Impaired Praxis Impairment Details: Motor planning  Cognition Overall Cognitive Status: Impaired/Different from baseline Arousal/Alertness: Awake/alert Orientation Level: Oriented to person;Oriented to place Attention: Focused Focused Attention: Appears intact Sustained Attention: Appears intact Selective Attention: Impaired Memory: Impaired Awareness: Impaired Problem Solving: Impaired Safety/Judgment: Impaired Sensation Sensation Light Touch: Appears Intact Hot/Cold: Not tested Proprioception: Impaired by gross assessment Stereognosis: Not tested Coordination Gross Motor Movements are Fluid and Coordinated: No Coordination and Movement Description: Gross motor movements impaired due to significant truncal and LE ataxia along with mild R hemiparesis Motor  Motor Motor: Hemiplegia;Abnormal postural alignment and control;Ataxia Motor - Discharge Observations: Pt still with mild right hemiparesis as well as trunk ataxia.  Mobility Bed Mobility Bed Mobility: Supine to Sit;Sit to Supine Supine to Sit: Supervision/Verbal cueing Sit to Supine: Supervision/Verbal cueing Transfers Transfers: Sit to Stand;Stand to Sit;Stand Pivot Transfers Sit to Stand: Contact Guard/Touching assist Stand to Sit: Contact Guard/Touching assist Stand Pivot Transfers: Contact  Guard/Touching assist;Minimal Assistance - Patient > 75% Stand Pivot Transfer Details: Verbal cues for precautions/safety;Verbal cues for technique;Verbal cues for sequencing;Visual cues/gestures for sequencing;Verbal cues for gait pattern;Verbal cues for safe use of DME/AE;Tactile cues for weight shifting Transfer (Assistive device): Rolling walker Locomotion  Gait Gait Assistance: Contact Guard/Touching assist;Minimal Assistance - Patient > 75% Gait Distance (Feet): 200 Feet Assistive device: Rolling walker Gait Assistance Details: Verbal cues for safe use of DME/AE;Verbal cues for technique;Verbal cues for sequencing;Verbal cues for gait pattern;Verbal cues for precautions/safety;Visual cues/gestures for sequencing;Visual cues for safe use of DME/AE;Tactile cues for weight shifting;Tactile cues for posture Gait Gait: Yes Gait Pattern: Impaired Gait Pattern: Trunk flexed;Step-through pattern;Decreased step length - right;Lateral trunk lean to right;Poor foot clearance - right;Decreased hip/knee flexion - left;Decreased hip/knee flexion - right;Ataxic Gait velocity: decreased Stairs / Additional Locomotion Stairs: Yes Stairs Assistance: Minimal Assistance - Patient > 75% Stair Management Technique: With walker Number of Stairs: 1 Height of Stairs: 8 Ramp: Minimal Assistance - Patient >75% Curb: Minimal Assistance - Patient >75% Wheelchair Mobility Wheelchair Mobility: No  Trunk/Postural Assessment  Cervical Assessment Cervical Assessment: Exceptions to [facility] (head tilt to the right with cervical protraction) Thoracic Assessment Thoracic Assessment: Exceptions to [facility] (increased thoracic kyphosis) Lumbar Assessment Lumbar Assessment: Exceptions to [facility] (posterior pelvic tilt) Postural Control Postural Control: Deficits on evaluation Protective Responses: impaired though improved for minor perturbations with pt able to recover with decreased assistance Postural Limitations: decreased due  to ataxia  Balance Balance Balance Assessed: Yes Standardized Balance Assessment Standardized Balance Assessment: Berg Balance Test;Timed Up and Go Test Berg Balance Test Sit to Stand: Needs minimal aid to stand or to stabilize Standing Unsupported: Unable to stand 30 seconds unassisted (posterior LOB after ~10seconds) Sitting with Back Unsupported but Feet Supported on Floor or Stool: Able to sit 2 minutes under supervision Stand to Sit: Sits independently, has uncontrolled descent Transfers: Needs one person to assist Standing Unsupported with Eyes Closed: Able to stand 10 seconds with supervision Standing Ubsupported with Feet Together: Needs help to attain  position but able to stand for 30 seconds with feet together From Standing, Reach Forward with Outstretched Arm: Reaches forward but needs supervision (able to reach at least 2" forward with close supervision for safety) From Standing Position, Pick up Object from Floor: Unable to pick up and needs supervision (able to pick up with CGA for safety) From Standing Position, Turn to Look Behind Over each Shoulder: Needs assist to keep from losing balance and falling Turn 360 Degrees: Needs assistance while turning Standing Unsupported, Alternately Place Feet on Step/Stool: Needs assistance to keep from falling or unable to try Standing Unsupported, One Foot in Front: Loses balance while stepping or standing Standing on One Leg: Unable to try or needs assist to prevent fall Total Score: 12 Timed Up and Go Test TUG: Normal TUG Normal TUG (seconds): 88 (able to come to stand in 12seconds as opposed to 20seconds) Ambulance person Sitting - Balance Support: Feet supported Static Sitting - Level of Assistance: 7: Independent Dynamic Sitting Balance Dynamic Sitting - Balance Support: Feet supported Dynamic Sitting - Level of Assistance: 6: Modified independent (Device/Increase time);5: Stand by assistance Static Standing  Balance Static Standing - Balance Support: During functional activity;Bilateral upper extremity supported Static Standing - Level of Assistance: Other (comment);5: Stand by assistance (CGA) Dynamic Standing Balance Dynamic Standing - Balance Support: During functional activity;Bilateral upper extremity supported Dynamic Standing - Level of Assistance: 4: Min assist Extremity Assessment      RLE Assessment RLE Assessment: Exceptions to [facility] Active Range of Motion (AROM) Comments: WFL RLE Strength Right Hip Flexion: 4-/5 Right Knee Flexion: 4-/5 Right Knee Extension: 4+/5 Right Ankle Dorsiflexion: 4-/5 Right Ankle Plantar Flexion: 4-/5 LLE Assessment LLE Assessment: Exceptions to [facility] Active Range of Motion (AROM) Comments: WFL General Strength Comments: hip flex weak LLE Strength Left Hip Flexion: 4/5 Left Knee Flexion: 4/5 Left Knee Extension: 4+/5 Left Ankle Dorsiflexion: 4-/5 Left Ankle Plantar Flexion: 4-/5    Tawana Scale , PT, DPT, CSRS  12/23/2020, 10:39 AM

## 2020-12-23 NOTE — Progress Notes (Signed)
Patient ID: Kristine Francis, female   DOB: 02-Feb-1941, 80 y.o.   MRN: [id number] Team Conference Report to Patient/Family  Team Conference discussion was reviewed with the patient and caregiver, including goals, any changes in plan of care and target discharge date.  Patient and caregiver express understanding and are in agreement.  The patient has a target discharge date of 12/24/20.  Dyanne Iha 12/23/2020, 2:01 PM

## 2020-12-23 NOTE — Progress Notes (Signed)
Pt was confused in the middle of the night, was yelling from time to time. Emotional support was provided, re-oriented to time and place. Frequent checks were  Done, toileted. Eventually pt calm down and fall a sleep. No further episodes of confusion or yelling.  Stool was collected for ocult blood and sent to lab.

## 2020-12-23 NOTE — Progress Notes (Signed)
Patient ID: Kristine Francis, female   DOB: 1941/07/19, 80 y.o.   MRN: [id number]   Wheelchair ordered through [facility].   [place], [place]

## 2020-12-23 NOTE — Progress Notes (Signed)
Speech Language Pathology Discharge Summary  Patient Details  Name: Kristine Francis MRN: [id number] Date of Birth: [birth year]  Today's Date: 12/23/2020 SLP Individual Time: 9381-8299 SLP Individual Time Calculation (min): 40 min   Skilled Therapeutic Interventions:  Pt was seen for skilled ST targeting completion of education regarding swallowing, speech, and cognitive function with pt and her husband. Handout, verbal review, and demonstration provided for thickening liquids to honey-thick (moderately thick) consistency. Also reviewed items to avoid such as ice cream, jello, ice in drinks, etc. Pt's husband expressed he feels confident in ability to modify pt's medication, food, and liquid textures for Dys 2/honey thick diet recommendations at this time. Discussed compensatory speech intelligibility strategies including slow rate, increased vocal intensity, and overarticulation, as well as the fluctuations that pt experiences in intelligibility when more fatigued and/or engaged in cognitively demanding tasks. Explicit recommendations for full assist with medications, cooking, finances, and 24/7 supervision reviewed. Also provided handout and verbal review of compensatory strategies to increase independence in recall/carryover of daily activities/responsibilities including note taking, external aids/calendars, routines, avoiding multitasking, etc. In addition, SLP reviewed recommendation for follow up outpatient speech therapy to continue to address dysphagia, dysarthria, and cognitive impairments. All questions answered to pt and her husband's satisfaction. Continue per current plan of care.    Patient has met 5 of 7 long term goals.  Patient to discharge at overall Min;Supervision level.  Reasons goals not met: increased cueing required for functional carryover of day to day information due to severe short term memory impairment, and on functional change in pharyngeal swallow function evidenced on MBS    Clinical Impression/Discharge Summary:   Pt made functional gains and met 5 out of 7 long term goals this admission. Pt currently requires Mod-Max assist for functional carryover of new information due to severe short term memory deficits, although she demonstrates ability to use compensatory strategies for memory with less Supervision-Min A cues. She also requires Min A for basic problem solving, and Supervision A for intellectual awareness of deficits due to cognitive impairments. Pt mild dysarthria due to generalized oral weakness, although she is mostly intelligible in conversation. Due to severity of swallowing and cognitive deficits, and relatively short length of stay, speech intelligibility goals were not formally addressed in treatment sessions, and therefore goal was discontinued. Although, it is recommended she receive follow up ST treatment for this domain, in addition to cognitive and swallow function. Pt also presents with a persistent mild oral and moderately severe pharyngeal dysphagia, (acute on chronic) and unfortunately, no functional improvements noted on last MBS 12/22/20. Pt is consuming a dysphagia 2 (minced/fine chop) diet with honey thick liquids. Due to memory deficits, she requires Min A verbal cueing for use of extra dry swallows and intermittent throat clear, as well as slow rate/small bites/sips. As a result of current swallowing and cognitive-linguistic deficits, she will require 24/7 supervision for greatest safety at home. Pt has demonstrated improved basic problem solving, intellectual awareness, use of compensatory memory strategies, and participation in pharyngeal strengthening exercises. However, given dysphagia, dysarthria, and cognitive deficits still present, recommend pt continue to receive skilled ST services upon discharge. Pt and family education is complete at this time.   Care Partner:  Caregiver Able to Provide Assistance: Yes  Type of Caregiver Assistance:  Cognitive;Physical  Recommendation:  24 hour supervision/assistance;Outpatient SLP  Rationale for SLP Follow Up: Maximize cognitive function and independence;Maximize functional communication;Maximize swallowing safety;Reduce caregiver burden   Equipment: none   Reasons for discharge: Discharged from  hospital   Patient/Family Agrees with Progress Made and Goals Achieved: Yes    Arbutus Leas 12/23/2020, 7:30 AM

## 2020-12-23 NOTE — Progress Notes (Signed)
[place] PHYSICAL MEDICINE & REHABILITATION PROGRESS NOTE   Subjective/Complaints: Episodes of confusion , responds to reorientation , on trazodone at noc , voiding well on urecholine , pt does not remember episode last noc    ROS-  Pt denies SOB, abd pain, CP, N/V/D, constipation   Objective:   DG Swallowing Func-Speech Pathology  Result Date: 12/22/2020 Objective Swallowing Evaluation: Type of Study: MBS-Modified Barium Swallow Study  Patient Details Name: Kristine Francis MRN: [id number] Date of Birth: July 18, 1941 Today's Date: 12/22/2020 Past Medical History: Past Medical History: Diagnosis Date . Allergy  . Asthma   sees Dr. Tiajuana Amass - as child per pt . At high risk for falls   unstable gait  . Ataxia  . Cataract   removed bilaterally  . Diverticulosis  . Emphysema  . Gait abnormality 05/14/2013  Kristine Francis a patient of Dr. Linda Hedges presented first interpreter thousand 13 with a gait dysfunction, she also had an episodic confusion and Loss device. Exam found a mild nystagmus and she was referred to ophthalmology on 03-2812, brain MRI was normal nystagmus was a secondary diagnosis to vestibulitis, ear nose and throat has followed and had seen the patient. At the time of the nystagmus the patie . GERD (gastroesophageal reflux disease)  . Hepatic steatosis  . Hyperlipidemia   on atorvastatin  . Hypertension   on losartan  . Iron deficiency anemia, unspecified  . Kidney cysts 11/16/12  small cyst on left . Nystagmus, end-position  . Osteopenia  . Renal cyst  . Stroke ([place]) 01/2017  mild  Past Surgical History: Past Surgical History: Procedure Laterality Date . ANAL RECTAL MANOMETRY N/A 12/20/2017  Procedure: ANO RECTAL MANOMETRY;  Surgeon: Mauri Pole, MD;  Location: [facility];  Service: Endoscopy;  Laterality: N/A; . BREAST BIOPSY Bilateral 1970's . CATARACT EXTRACTION Bilateral LT:02/14/13,RT:02/21/13 . CERVICAL SPINE SURGERY  2010 . COLONOSCOPY  03-12-14  per Dr. Olevia Perches, diverticulosis only, repeat  5 yrs  . EYE SURGERY Bilateral 01/2013&02/2013  left then right . HERNIA REPAIR  Oct '14-left, remote-right  years ago right; left done '14 . SKIN CANCER EXCISION  05/13/13  FACE, basal cell . TONSILLECTOMY   HPI: 81 yo female admitted 11/25/20 after becoming unresponsive. [name], unable to use RUE. Intubated in ED. 2 minutes of CPR. MRI Multiple foci of susceptibility artifact in the bilateral cerebral hemispheres more pronounced in occipital region. Remote lacunar infarct in the right basal ganglia region, PMH: cervical fusion 2010, CVA (2018), HTN, HLD, gait abnormality, anemia, emphysema, asthma, GERD, diverticulosis, renal cyst, osteopenia. ETT 1/5-8/22. Pt admitted to [facility] 12/10/20.  Subjective: Pt awake, follows directions. Perseverative on calling out for Kristine Francis (spouse) Assessment / Plan / Recommendation CHL IP CLINICAL IMPRESSIONS 12/22/2020 Clinical Impression Pt continues to present with acute on chronic primarily pharyngeal dysphagia that is largely unchanged since last MBS (12/04/20). Pt's cervical vertebrae is severely kyphotic with noted cervical hardware (2010). Consistent penetration and silent aspiration (PAS scores 6 and 8) was noted with trials of nectar and thin barium. Cued coughing and throat clearing was ineffective to fully clear aspirates. Vallecular and pyriform sinus residue also noted across textures, but more prominently with thicker textures. Honey thick barium resulted in a more timely initiation of swallow, and no airway intrusion during the swallow. However, on later imaging, a moderate degree of aspiration noted, which SLP suspects was from vallecular residue of honey barium aspirated after the swallow. During additional trials of honey, when pt was cued to perform 2 extra  dry swallows, vallecular residue was minimized and no airway intrusion noted. Dysphagia 3 (mechanical soft) textures were masticated efficiently, but did result in significant vallecular residue that was difficult  for pt to clear even with extra dry swallows. Given results of today's study, it is recommended that pt continue her current Dysphagia 2 (minced/chopped) texture diet with honey thick liquids. Medications should be crushed. Pt should perform a minimum of 2 extra dry swallows for every sip/bite. Follow up interventions may consider [place] Water Protocol to assess tolerance of thin with strict oral care considerations, given that pt's dysphagia is partially chronic. Continue per current plan of care. SLP Visit Diagnosis Dysphagia, pharyngeal phase (R13.13) Attention and concentration deficit following -- Frontal lobe and executive function deficit following -- Impact on safety and function Moderate aspiration risk   CHL IP TREATMENT RECOMMENDATION 12/04/2020 Treatment Recommendations Therapy as outlined in treatment plan below   Prognosis 12/04/2020 Prognosis for Safe Diet Advancement (No Data) Barriers to Reach Goals (No Data) Barriers/Prognosis Comment -- CHL IP DIET RECOMMENDATION 12/22/2020 SLP Diet Recommendations Dysphagia 2 (Fine chop) solids;Honey thick liquids Liquid Administration via Cup;No straw Medication Administration Crushed with puree Compensations Slow rate;Small sips/bites;Multiple dry swallows after each bite/sip;Clear throat intermittently;Minimize environmental distractions Postural Changes Remain semi-upright after after feeds/meals (Comment);Seated upright at 90 degrees   CHL IP OTHER RECOMMENDATIONS 12/22/2020 Recommended Consults -- Oral Care Recommendations Oral care BID Other Recommendations Remove water pitcher;Prohibited food (jello, ice cream, thin soups);Order thickener from pharmacy;Have oral suction available   CHL IP FOLLOW UP RECOMMENDATIONS 12/08/2020 Follow up Recommendations Inpatient Rehab   CHL IP FREQUENCY AND DURATION 12/04/2020 Speech Therapy Frequency (ACUTE ONLY) min 2x/week Treatment Duration 2 weeks      CHL IP ORAL PHASE 12/22/2020 Oral Phase Impaired Oral - Pudding Teaspoon --  Oral - Pudding Cup -- Oral - Honey Teaspoon -- Oral - Honey Cup Decreased bolus cohesion Oral - Nectar Teaspoon NT Oral - Nectar Cup Decreased bolus cohesion Oral - Nectar Straw -- Oral - Thin Teaspoon NT Oral - Thin Cup Decreased bolus cohesion Oral - Thin Straw -- Oral - Puree -- Oral - Mech Soft WFL Oral - Regular -- Oral - Multi-Consistency -- Oral - Pill -- Oral Phase - Comment --  CHL IP PHARYNGEAL PHASE 12/22/2020 Pharyngeal Phase Impaired Pharyngeal- Pudding Teaspoon -- Pharyngeal -- Pharyngeal- Pudding Cup -- Pharyngeal -- Pharyngeal- Honey Teaspoon -- Pharyngeal -- Pharyngeal- Honey Cup Reduced epiglottic inversion;Reduced airway/laryngeal closure;Penetration/Apiration after swallow;Pharyngeal residue - valleculae;Reduced pharyngeal peristalsis Pharyngeal Material enters airway, passes BELOW cords without attempt by patient to eject out (silent aspiration) Pharyngeal- Nectar Teaspoon NT Pharyngeal -- Pharyngeal- Nectar Cup Reduced pharyngeal peristalsis;Reduced airway/laryngeal closure;Penetration/Aspiration during swallow;Pharyngeal residue - valleculae;Pharyngeal residue - pyriform;Reduced epiglottic inversion Pharyngeal Material enters airway, passes BELOW cords without attempt by patient to eject out (silent aspiration) Pharyngeal- Nectar Straw -- Pharyngeal -- Pharyngeal- Thin Teaspoon NT Pharyngeal -- Pharyngeal- Thin Cup Reduced pharyngeal peristalsis;Penetration/Aspiration during swallow;Reduced airway/laryngeal closure;Reduced epiglottic inversion;Compensatory strategies attempted (with notebox) Pharyngeal Material enters airway, passes BELOW cords without attempt by patient to eject out (silent aspiration) Pharyngeal- Thin Straw -- Pharyngeal -- Pharyngeal- Puree -- Pharyngeal -- Pharyngeal- Mechanical Soft Reduced pharyngeal peristalsis;Pharyngeal residue - valleculae;Reduced epiglottic inversion Pharyngeal -- Pharyngeal- Regular -- Pharyngeal -- Pharyngeal- Multi-consistency -- Pharyngeal --  Pharyngeal- Pill -- Pharyngeal -- Pharyngeal Comment --  CHL IP CERVICAL ESOPHAGEAL PHASE 12/22/2020 Cervical Esophageal Phase Impaired Pudding Teaspoon -- Pudding Cup -- Honey Teaspoon -- Honey Cup -- Nectar Teaspoon -- Nectar Cup -- [employer]  Straw -- Thin Teaspoon -- Thin Cup -- Thin Straw -- Puree -- Mechanical Soft -- Regular -- Multi-consistency -- Pill -- Cervical Esophageal Comment -- Arbutus Leas 12/22/2020, 9:45 AM              Recent Labs    12/21/20 0509  WBC 6.6  HGB 8.9*  HCT 27.3*  PLT 445*   Recent Labs    12/21/20 0509  NA 134*  K 4.1  CL 98  CO2 28  GLUCOSE 95  BUN 15  CREATININE 0.73  CALCIUM 9.0    Intake/Output Summary (Last 24 hours) at 12/23/2020 0817 Last data filed at 12/23/2020 0140 Gross per 24 hour  Intake 530 ml  Output 1 ml  Net 529 ml     Pressure Injury 12/10/20 Sacrum Mid Stage 2 -  Partial thickness loss of dermis presenting as a shallow open injury with a red, pink wound bed without slough. Small circular area at tail bone, center (Active)  12/10/20 1505  Location: Sacrum  Location Orientation: Mid  Staging: Stage 2 -  Partial thickness loss of dermis presenting as a shallow open injury with a red, pink wound bed without slough.  Wound Description (Comments): Small circular area at tail bone, center  Present on Admission: Yes    Physical Exam: Vital Signs Blood pressure 139/74, pulse 67, temperature 97.9 F (36.6 C), resp. rate 14, height '5\' 1"'  (1.549 m), weight 39.5 kg, SpO2 99 %.   General: No acute distress Mood and affect are appropriate Heart: Regular rate and rhythm no rubs murmurs or extra sounds Lungs: Clear to auscultation, breathing unlabored, no rales or wheezes Abdomen: Positive bowel sounds, soft nontender to palpation, nondistended Extremities: No clubbing, cyanosis, or edema Skin: No evidence of breakdown, no evidence of rash  Neurologic: Cranial nerves II through XII intact, motor strength is 5/5 in bilateral deltoid,  bicep, tricep, grip, hip flexor, knee extensors, ankle dorsiflexor and plantar flexor Sensory exam normal sensation to light touch and proprioception in bilateral upper and lower extremities Cerebellar exam mild dysmetria BUE  Musculoskeletal: Full range of motion in all 4 extremities. No joint swelling  Assessment/Plan: 1. Functional deficits which require 3+ hours per day of interdisciplinary therapy in a comprehensive inpatient rehab setting.  Physiatrist is providing close team supervision and 24 hour management of active medical problems listed below.  Physiatrist and rehab team continue to assess barriers to discharge/monitor patient progress toward functional and medical goals  Care Tool:  Bathing    Body parts bathed by patient: Right arm,Left arm,Chest,Front perineal area,Abdomen,Face,Right upper leg,Left upper leg,Right lower leg,Left lower leg,Buttocks   Body parts bathed by helper: Buttocks     Bathing assist Assist Level: Minimal Assistance - Patient > 75%     Upper Body Dressing/Undressing Upper body dressing   What is the patient wearing?: Pull over shirt    Upper body assist Assist Level: Supervision/Verbal cueing    Lower Body Dressing/Undressing Lower body dressing      What is the patient wearing?: Pants     Lower body assist Assist for lower body dressing: Minimal Assistance - Patient > 75%     Toileting Toileting    Toileting assist Assist for toileting: Maximal Assistance - Patient 25 - 49%     Transfers Chair/bed transfer  Transfers assist     Chair/bed transfer assist level: Minimal Assistance - Patient > 75% Chair/bed transfer assistive device: Walker,Armrests   Locomotion Ambulation   Ambulation assist  Assist level: Minimal Assistance - Patient > 75% Assistive device: Walker-rolling Max distance: 158f   Walk 10 feet activity   Assist     Assist level: Contact Guard/Touching assist Assistive device:  Walker-rolling   Walk 50 feet activity   Assist Walk 50 feet with 2 turns activity did not occur: Safety/medical concerns  Assist level: Contact Guard/Touching assist Assistive device: Walker-rolling    Walk 150 feet activity   Assist Walk 150 feet activity did not occur: Safety/medical concerns  Assist level: Minimal Assistance - Patient > 75% Assistive device: Walker-rolling    Walk 10 feet on uneven surface  activity   Assist Walk 10 feet on uneven surfaces activity did not occur: Safety/medical concerns         Wheelchair     Assist Will patient use wheelchair at discharge?: No Type of Wheelchair: Manual    Wheelchair assist level: Maximal Assistance - Patient 25 - 49% Max wheelchair distance: 75    Wheelchair 50 feet with 2 turns activity    Assist        Assist Level: Maximal Assistance - Patient 25 - 49%   Wheelchair 150 feet activity     Assist  Wheelchair 150 feet activity did not occur: Safety/medical concerns       Blood pressure 139/74, pulse 67, temperature 97.9 F (36.6 C), resp. rate 14, height '5\' 1"'  (1.549 m), weight 39.5 kg, SpO2 99 %.  Medical Problem List and Plan: 1. hypoxic encephalopathy  - moderate to severe secondary to tonic clonic seizure, amyloid angiopathy,              -patient may  shower             -ELOS/Goals: plan d/c 2/3  -Continue CIR, team conf - Team conference today please see physician documentation under team conference tab, met with team  to discuss problems,progress, and goals. Formulized individual treatment plan based on medical history, underlying problem and comorbidities. Plan d/c in am  2.  Antithrombotics: -DVT/anticoagulation:  Pharmaceutical: Other (comment)--on Eliquis- also for Afib with RVR             -antiplatelet therapy: N/a 3. Pain Management: N/A 4. Mood: LCSW to follow for evaluation and support.              -antipsychotic agents: N/A 5. Neuropsych: This patient is not fully  capable of making decisions on her own behalf.  1/23- very poor memory- con't regimen 6. Skin/Wound Care: Routine pressure relief measures.  7. Fluids/Electrolytes/Nutrition: Monitor I/O. Check lytes in am.  8. PAF: New onset--continue Metoprolol and eliquis BID. Vitals:   12/22/20 1946 12/23/20 0332  BP: 105/61 139/74  Pulse: 68 67  Resp: 20 14  Temp: (!) 97.4 F (36.3 C) 97.9 F (36.6 C)  SpO2: 100% 99%   Rate controlled but BP up, add low dose amlodipine   2/1: BP better controlled with amlodipine. 9. Dysphagia: On D2, honey liquids. Monitor hydration with check of lytes in am and next week.discussed normal progression to thin liquids  1/22- tolerating breakfast- con't regimen and recheck Monday  1/23- pt asking for coffee- will try to arrange thickened coffee for pt  10.  Hyponateremia: Will recheck Na in am--question dilutional  1/23- Na 132- will recheck labs next week, esp since on thickened liquids.    1/31: Na reviewed, up to 134, repeat weekly 11. Acute on chronic Iron deficiency anemia:H/H down from 11-->8.7. Check stool guaiacs. Monitor for signs of bleeding.  Will add iron supplement. Stools heme + monitor hgb on Eliquis - add protonix - GI f/u as OP ,   1/31: Hgb reviewed, 8.9 up from 8.4, repeat weekly 12. Leukocytosis:due to PNA  Resolved 13. Seizure prophylaxis: Stable on Keppra. Tolerating without SE.   14.  Urinary retention added urecholine UA neg - improved , will d/c urecholine and observe 15.  Hypoalbuminemia- will add Prostat- enc hi protein food sources 16.  Insomnia - some confusion at noc, may be anticholinergic effect of trazodone, switch to melatonin LOS: 13 days A FACE TO FACE EVALUATION WAS PERFORMED  Charlett Blake 12/23/2020, 8:17 AM

## 2020-12-23 NOTE — Progress Notes (Signed)
Occupational Therapy Discharge Summary  Patient Details  Name: Kristine Francis MRN: [id number] Date of Birth: 20-Feb-1941  Today's Date: 12/23/2020 OT Individual Time: 1105-1200 OT Individual Time Calculation (min): 55 min   Session Note:  Pt completed supine to sit EOB with min guard assist and then scooted to the edge at the same level.  When asked, she voiced the need to toilet, so had her ambulate into the bathroom with min guard assist and use of the RW.   Mod demonstrational cueing for upright posture and head in the middle as she tends to hold her head tilted to the right.  She needed min assist for clothing management and toilet hygiene with slight bowel incontinence noted in the brief.  She was able to complete ambulation back out to the sink with min guard for washing hands in standing.  Pt still with slight trunk ataxia during standing and with mobility, but did not demonstrate LOB.  Once complete, had her transfer to the wheelchair where she was taken down to the therapy gym for work on [place] coordination.  She was able to work on placing small pegs in peg board with the RUE, but needed increased time secondary to not being able to efficiently pick up the pegs or place them without multiple attempts.  Finished session with return to the wheelchair with min guard assist.  She was taken back to the room via wheelchair where she was positioned in the wheelchair with her spouse present for supervision and with the call button and phone in reach.    Patient has met 3 of 12 long term goals due to improved balance, postural control, ability to compensate for deficits, functional use of  RIGHT upper extremity, improved attention, improved awareness and improved coordination.  Patient to discharge at [facility] Assist level.  Patient's care partner is independent to provide the necessary physical and cognitive assistance at discharge.    Reasons goals not met: Pt continues to need min assist to min  guard for most transfers and selfcare with goals currently at supervision to modified independent  Recommendation:  Patient will benefit from ongoing skilled OT services in outpatient setting to continue to advance functional skills in the area of BADL and Reduce care partner burden.  Pt still continues to demonstrate right hemiparesis with decreased overall balance as well as decreased memory.  Feel she currently needs overall min assist for functional tasks and will benefit from outpatient OT for continued progression with all of these deficits in order to increase ADL independence.    Equipment: 3:1, tub bench  Reasons for discharge: treatment goals met and discharge from hospital  Patient/family agrees with progress made and goals achieved: Yes  OT Discharge Precautions/Restrictions  Precautions Precautions: Fall;Other (comment) Restrictions Weight Bearing Restrictions: No   Pain Pain Assessment Pain Scale: Faces Pain Score: 0-No pain ADL ADL Eating: Set up Where Assessed-Eating: Wheelchair Grooming: Setup Where Assessed-Grooming: Wheelchair Upper Body Bathing: Supervision/safety Where Assessed-Upper Body Bathing: Wheelchair Lower Body Bathing: Contact guard Where Assessed-Lower Body Bathing: Wheelchair,Sitting at sink,Standing at sink Upper Body Dressing: Supervision/safety Where Assessed-Upper Body Dressing: Wheelchair Lower Body Dressing: Minimal assistance Where Assessed-Lower Body Dressing: Wheelchair,Sitting at sink,Standing at sink Toileting: Minimal assistance Where Assessed-Toileting: Research scientist (life sciences): Therapist, music Method: Counselling psychologist: Radiographer, therapeutic: Minimal Museum/gallery conservator Method: Librarian, academic: Facilities manager: Environmental education officer Method: Radiographer, therapeutic: Civil engineer, contracting  with  back Vision Baseline Vision/History: Wears glasses Wears Glasses: Reading only Patient Visual Report: No change from baseline Vision Assessment?: Yes Eye Alignment: Within Functional Limits Ocular Range of Motion: Within Functional Limits Alignment/Gaze Preference: Within Defined Limits Tracking/Visual Pursuits: Decreased smoothness of horizontal tracking;Decreased smoothness of vertical tracking;Other (comment) (direction changing end range nystagmus on both the left and right.) Convergence: Impaired (comment) Visual Fields: No apparent deficits Perception  Perception: Within Functional Limits Praxis Praxis: Intact Cognition Overall Cognitive Status: Impaired/Different from baseline Arousal/Alertness: Awake/alert Attention: Sustained;Selective Sustained Attention: Appears intact Selective Attention: Impaired Memory: Impaired Memory Impairment: Decreased recall of new information;Decreased short term memory Decreased Short Term Memory: Verbal basic;Functional basic Awareness Impairment: Emergent impairment Problem Solving: Impaired Safety/Judgment: Impaired Sensation Sensation Light Touch: Appears Intact Hot/Cold: Appears Intact Proprioception: Not tested Stereognosis: Impaired Detail Stereognosis Impaired Details: Impaired RUE Coordination Gross Motor Movements are Fluid and Coordinated: No Fine Motor Movements are Fluid and Coordinated: No Coordination and Movement Description: Decreased RUE FM and gross motor coordination but she uses the UE at a diminshed level for selfcare tasks. 9 Hole Peg Test: 2:06 on the left and 6:20 for 8 pegs on the right to place them only. Motor  Motor Motor: Hemiplegia;Abnormal postural alignment and control;Ataxia Motor - Discharge Observations: Pt still with mild right hemiparesis as well as trunk ataxia. Mobility  Bed Mobility Bed Mobility: Rolling Right;Right Sidelying to Sit Rolling Right: Supervision/verbal cueing Right Sidelying  to Sit: Supervision/Verbal cueing Sitting - Scoot to Edge of Bed: Contact Guard/Touching assist (increased time) Transfers Sit to Stand: Contact Guard/Touching assist Stand to Sit: Contact Guard/Touching assist  Trunk/Postural Assessment  Cervical Assessment Cervical Assessment: Exceptions to [facility] (head tilt to the right with cervical protraction) Thoracic Assessment Thoracic Assessment: Exceptions to [facility] (thoracic kyphosis) Lumbar Assessment Lumbar Assessment: Exceptions to [facility] (posterior pelvic tilt)  Balance Balance Balance Assessed: Yes Static Sitting Balance Static Sitting - Balance Support: Feet supported Static Sitting - Level of Assistance: 7: Independent Dynamic Sitting Balance Dynamic Sitting - Balance Support: Feet supported Dynamic Sitting - Level of Assistance: 6: Modified independent (Device/Increase time) Static Standing Balance Static Standing - Balance Support: During functional activity Static Standing - Level of Assistance: 5: Stand by assistance Dynamic Standing Balance Dynamic Standing - Balance Support: During functional activity;Bilateral upper extremity supported Dynamic Standing - Level of Assistance: 4: Min assist Extremity/Trunk Assessment RUE Assessment RUE Assessment: Exceptions to [facility] Active Range of Motion (AROM) Comments: shoulder flexion AROM 0-140 degrees, all other joints AROM WFLs General Strength Comments: strength 4/5 throughout.  slower finger to nose testing compared to the LUE with decreased FM coordination noted.  Pt is unable to tie her shoes at this time. LUE Assessment LUE Assessment: Within Functional Limits Active Range of Motion (AROM) Comments: shoulder flexion 0-130 degrees with all other joints AROM WFLs General Strength Comments: 4+/5 shoulder flexion, grip strength   Kay Shippy OTR/L 12/23/2020, 12:31 PM

## 2020-12-24 ENCOUNTER — Telehealth: Payer: Self-pay | Admitting: Family Medicine

## 2020-12-24 ENCOUNTER — Other Ambulatory Visit (HOSPITAL_COMMUNITY): Payer: Self-pay | Admitting: Physical Medicine and Rehabilitation

## 2020-12-24 MED ORDER — APIXABAN 5 MG PO TABS: 5.0000 mg | ORAL_TABLET | Freq: Two times a day (BID) | ORAL | 0 refills | Status: DC

## 2020-12-24 MED ORDER — ATORVASTATIN CALCIUM 10 MG PO TABS: 10.0000 mg | ORAL_TABLET | Freq: Every day | ORAL | 0 refills | Status: DC

## 2020-12-24 MED ORDER — MELATONIN 3 MG PO TABS: 3.0000 mg | ORAL_TABLET | Freq: Every day | ORAL | 0 refills | Status: DC

## 2020-12-24 MED ORDER — METOPROLOL TARTRATE 25 MG PO TABS: 25.0000 mg | ORAL_TABLET | Freq: Two times a day (BID) | ORAL | 0 refills | Status: DC

## 2020-12-24 MED ORDER — LEVETIRACETAM 500 MG PO TABS: 500.0000 mg | ORAL_TABLET | Freq: Two times a day (BID) | ORAL | 2 refills | Status: DC

## 2020-12-24 MED ORDER — AMLODIPINE BESYLATE 2.5 MG PO TABS: 2.5000 mg | ORAL_TABLET | Freq: Every day | ORAL | 0 refills | Status: DC

## 2020-12-24 MED ORDER — THICK-IT PO PACK: 1.0000 | PACK | Freq: Four times a day (QID) | ORAL | 0 refills | Status: AC | PRN

## 2020-12-24 MED ORDER — PANTOPRAZOLE SODIUM 20 MG PO TBEC: 20.0000 mg | DELAYED_RELEASE_TABLET | Freq: Every day | ORAL | 0 refills | Status: DC

## 2020-12-24 MED FILL — ELIQUIS 5 MG TABLET: 5 | 30 days supply | Qty: 60 | Fill #0

## 2020-12-24 NOTE — Telephone Encounter (Signed)
Transition Care Management Follow-up Telephone Call  Date of discharge and from where: 12/24/20 from [facility]   How have you been since you were released from the hospital? Patient's husband states patient was doing well, she was having lunch at the time of call.  Any questions or concerns? No  Items Reviewed:  Did the pt receive and understand the discharge instructions provided? Yes   Medications obtained and verified? Yes   Other? No   Any new allergies since your discharge? No   Dietary orders reviewed? Yes  Do you have support at home? Yes   Home Care and Equipment/Supplies: Were home health services ordered? not applicable If so, what is the name of the agency? N/A  Has the agency set up a time to come to the patient's home? not applicable Were any new equipment or medical supplies ordered?  No What is the name of the medical supply agency? N/A Were you able to get the supplies/equipment? not applicable Do you have any questions related to the use of the equipment or supplies? No  Functional Questionnaire: (I = Independent and D = Dependent) ADLs: D  Bathing/Dressing- I with assistance  Meal Prep- D  Eating- I  Maintaining continence- I  Transferring/Ambulation- D uses wheelchair/ walker  Managing Meds- D  Follow up appointments reviewed:   PCP Hospital f/u appt confirmed? Yes  Scheduled to see Dr. Sarajane Jews  on 12/30/2020  @ 1:00 PM.  [facility] f/u appt confirmed? No    Are transportation arrangements needed? No   If their condition worsens, is the pt aware to call PCP or go to the Emergency Dept.? Yes  Was the patient provided with contact information for the PCP's office or ED? Yes  Was to pt encouraged to call back with questions or concerns? Yes

## 2020-12-24 NOTE — Progress Notes (Signed)
Inpatient Rehabilitation Care Coordinator Discharge Note  The overall goal for the admission was met for:   Discharge location: Yes, home   Length of Stay: Yes, 14 Days  Discharge activity level: Yes  Home/community participation: Yes  Services provided included: MD, RD, PT, OT, SLP, RN, CM, TR, Pharmacy, Neuropsych and SW  Financial Services: Medicare  Choices offered to/list presented to:patient/spouse   Follow-up services arranged: Outpatient: [facility]   Comments (or additional information): PT OT [facility] Wheelchair, Shower Cahir, [facility] (DME delivered to home)  Patient/Family verbalized understanding of follow-up arrangements: Yes  Individual responsible for coordination of the follow-up plan: David, [phone]  Confirmed correct DME delivered:  J  12/24/2020     J  

## 2020-12-24 NOTE — Discharge Summary (Signed)
Physician Discharge Summary  Patient ID: Kristine Francis MRN: [id number] DOB/AGE: Nov 01, 1941 80 y.o.  Admit date: 12/10/2020 Discharge date: 12/24/2020  Discharge Diagnoses:  Principal Problem:   Encephalopathy Active Problems:   Essential hypertension   Protein-calorie malnutrition, severe   Heme + stool   Hyponatremia   Anemia   Discharged Condition:  Stable   Significant Diagnostic Studies:  DG Swallowing Func-Speech Pathology  Result Date: 12/22/2020 Objective Swallowing Evaluation: Type of Study: MBS-Modified Barium Swallow Study  Patient Details Name: Kristine Francis MRN: [id number] Date of Birth: [birth year] Today's Date: 12/22/2020 Past Medical History: Past Medical History: Diagnosis Date . Allergy  . Asthma   sees Dr. Tiajuana Amass - as child per pt . At high risk for falls   unstable gait  . Ataxia  . Cataract   removed bilaterally  . Diverticulosis  . Emphysema  . Gait abnormality 05/14/2013  Ms.Baro a patient of Dr. Linda Hedges presented first interpreter thousand 13 with a gait dysfunction, she also had an episodic confusion and Loss device. Exam found a mild nystagmus and she was referred to ophthalmology on 03-2812, brain MRI was normal nystagmus was a secondary diagnosis to vestibulitis, ear nose and throat has followed and had seen the patient. At the time of the nystagmus the patie . GERD (gastroesophageal reflux disease)  . Hepatic steatosis  . Hyperlipidemia   on atorvastatin  . Hypertension   on losartan  . Iron deficiency anemia, unspecified  . Kidney cysts 11/16/12  small cyst on left . Nystagmus, end-position  . Osteopenia  . Renal cyst  . Stroke ([place]) 01/2017  mild  Past Surgical History: Past Surgical History: Procedure Laterality Date . ANAL RECTAL MANOMETRY N/A 12/20/2017  Procedure: ANO RECTAL MANOMETRY;  Surgeon: Mauri Pole, MD;  Location: [facility];  Service: Endoscopy;  Laterality: N/A; . BREAST BIOPSY Bilateral 1970's . CATARACT EXTRACTION Bilateral  LT:02/14/13,RT:02/21/13 . CERVICAL SPINE SURGERY  2010 . COLONOSCOPY  03-12-14  per Dr. Olevia Perches, diverticulosis only, repeat 5 yrs  . EYE SURGERY Bilateral 01/2013&02/2013  left then right . HERNIA REPAIR  Oct '14-left, remote-right  years ago right; left done '14 . SKIN CANCER EXCISION  05/13/13  FACE, basal cell . TONSILLECTOMY   HPI: 80 yo female admitted 11/25/20 after becoming unresponsive. [name], unable to use RUE. Intubated in ED. 2 minutes of CPR. MRI Multiple foci of susceptibility artifact in the bilateral cerebral hemispheres more pronounced in occipital region. Remote lacunar infarct in the right basal ganglia region, PMH: cervical fusion 2010, CVA (2018), HTN, HLD, gait abnormality, anemia, emphysema, asthma, GERD, diverticulosis, renal cyst, osteopenia. ETT 1/5-8/22. Pt admitted to [facility] 12/10/20.  Subjective: Pt awake, follows directions. Perseverative on calling out for [name] (spouse) Assessment / Plan / Recommendation CHL IP CLINICAL IMPRESSIONS 12/22/2020 Clinical Impression Pt continues to present with acute on chronic primarily pharyngeal dysphagia that is largely unchanged since last MBS (12/04/20). Pt's cervical vertebrae is severely kyphotic with noted cervical hardware (2010). Consistent penetration and silent aspiration (PAS scores 6 and 8) was noted with trials of nectar and thin barium. Cued coughing and throat clearing was ineffective to fully clear aspirates. Vallecular and pyriform sinus residue also noted across textures, but more prominently with thicker textures. Honey thick barium resulted in a more timely initiation of swallow, and no airway intrusion during the swallow. However, on later imaging, a moderate degree of aspiration noted, which SLP suspects was from vallecular residue of honey barium aspirated after the swallow. During additional  trials of honey, when pt was cued to perform 2 extra dry swallows, vallecular residue was minimized and no airway intrusion noted. Dysphagia 3  (mechanical soft) textures were masticated efficiently, but did result in significant vallecular residue that was difficult for pt to clear even with extra dry swallows. Given results of today's study, it is recommended that pt continue her current Dysphagia 2 (minced/chopped) texture diet with honey thick liquids. Medications should be crushed. Pt should perform a minimum of 2 extra dry swallows for every sip/bite. Follow up interventions may consider Fr[place] Water Protocol to assess tolerance of thin with strict oral care considerations, given that pt's dysphagia is partially chronic. Continue per current plan of care. SLP Visit Diagnosis Dysphagia, pharyngeal phase (R13.13) Attention and concentration deficit following -- Frontal lobe and executive function deficit following -- Impact on safety and function Moderate aspiration risk   CHL IP TREATMENT RECOMMENDATION 12/04/2020 Treatment Recommendations Therapy as outlined in treatment plan below   Prognosis 12/04/2020 Prognosis for Safe Diet Advancement (No Data) Barriers to Reach Goals (No Data) Barriers/Prognosis Comment -- CHL IP DIET RECOMMENDATION 12/22/2020 SLP Diet Recommendations Dysphagia 2 (Fine chop) solids;Honey thick liquids Liquid Administration via Cup;No straw Medication Administration Crushed with puree Compensations Slow rate;Small sips/bites;Multiple dry swallows after each bite/sip;Clear throat intermittently;Minimize environmental distractions Postural Changes Remain semi-upright after after feeds/meals (Comment);Seated upright at 90 degrees   CHL IP OTHER RECOMMENDATIONS 12/22/2020 Recommended Consults -- Oral Care Recommendations Oral care BID Other Recommendations Remove water pitcher;Prohibited food (jello, ice cream, thin soups);Order thickener from pharmacy;Have oral suction available   CHL IP FOLLOW UP RECOMMENDATIONS 12/08/2020 Follow up Recommendations Inpatient Rehab   CHL IP FREQUENCY AND DURATION 12/04/2020 Speech Therapy Frequency (ACUTE  ONLY) min 2x/week Treatment Duration 2 weeks      CHL IP ORAL PHASE 12/22/2020 Oral Phase Impaired Oral - Pudding Teaspoon -- Oral - Pudding Cup -- Oral - Honey Teaspoon -- Oral - Honey Cup Decreased bolus cohesion Oral - Nectar Teaspoon NT Oral - Nectar Cup Decreased bolus cohesion Oral - Nectar Straw -- Oral - Thin Teaspoon NT Oral - Thin Cup Decreased bolus cohesion Oral - Thin Straw -- Oral - Puree -- Oral - Mech Soft WFL Oral - Regular -- Oral - Multi-Consistency -- Oral - Pill -- Oral Phase - Comment --  CHL IP PHARYNGEAL PHASE 12/22/2020 Pharyngeal Phase Impaired Pharyngeal- Pudding Teaspoon -- Pharyngeal -- Pharyngeal- Pudding Cup -- Pharyngeal -- Pharyngeal- Honey Teaspoon -- Pharyngeal -- Pharyngeal- Honey Cup Reduced epiglottic inversion;Reduced airway/laryngeal closure;Penetration/Apiration after swallow;Pharyngeal residue - valleculae;Reduced pharyngeal peristalsis Pharyngeal Material enters airway, passes BELOW cords without attempt by patient to eject out (silent aspiration) Pharyngeal- Nectar Teaspoon NT Pharyngeal -- Pharyngeal- Nectar Cup Reduced pharyngeal peristalsis;Reduced airway/laryngeal closure;Penetration/Aspiration during swallow;Pharyngeal residue - valleculae;Pharyngeal residue - pyriform;Reduced epiglottic inversion Pharyngeal Material enters airway, passes BELOW cords without attempt by patient to eject out (silent aspiration) Pharyngeal- Nectar Straw -- Pharyngeal -- Pharyngeal- Thin Teaspoon NT Pharyngeal -- Pharyngeal- Thin Cup Reduced pharyngeal peristalsis;Penetration/Aspiration during swallow;Reduced airway/laryngeal closure;Reduced epiglottic inversion;Compensatory strategies attempted (with notebox) Pharyngeal Material enters airway, passes BELOW cords without attempt by patient to eject out (silent aspiration) Pharyngeal- Thin Straw -- Pharyngeal -- Pharyngeal- Puree -- Pharyngeal -- Pharyngeal- Mechanical Soft Reduced pharyngeal peristalsis;Pharyngeal residue - valleculae;Reduced  epiglottic inversion Pharyngeal -- Pharyngeal- Regular -- Pharyngeal -- Pharyngeal- Multi-consistency -- Pharyngeal -- Pharyngeal- Pill -- Pharyngeal -- Pharyngeal Comment --  CHL IP CERVICAL ESOPHAGEAL PHASE 12/22/2020 Cervical Esophageal Phase Impaired Pudding Teaspoon -- Pudding Cup -- Honey Teaspoon --  Honey Cup -- Nectar Teaspoon -- Nectar Cup -- Nectar Straw -- Thin Teaspoon -- Thin Cup -- Thin Straw -- Puree -- Mechanical Soft -- Regular -- Multi-consistency -- Pill -- Cervical Esophageal Comment -- Arbutus Leas 12/22/2020, 9:45 AM                Labs:  Basic Metabolic Panel: BMP Latest Ref Rng & Units 12/21/2020 12/14/2020 12/11/2020  Glucose 70 - 99 mg/dL 95 93 99  BUN 8 - 23 mg/dL 15 11 12   Creatinine 0.44 - 1.00 mg/dL 0.73 0.52 0.56  Sodium 135 - 145 mmol/L 134(L) 134(L) 132(L)  Potassium 3.5 - 5.1 mmol/L 4.1 4.0 4.1  Chloride 98 - 111 mmol/L 98 100 97(L)  CO2 22 - 32 mmol/L 28 28 26   Calcium 8.9 - 10.3 mg/dL 9.0 8.3(L) 8.2(L)    CBC: CBC Latest Ref Rng & Units 12/21/2020 12/14/2020 12/11/2020  WBC 4.0 - 10.5 K/uL 6.6 8.0 9.9  Hemoglobin 12.0 - 15.0 g/dL 8.9(L) 8.4(L) 8.2(L)  Hematocrit 36.0 - 46.0 % 27.3(L) 26.6(L) 25.4(L)  Platelets 150 - 400 K/uL 445(H) 787(H) 739(H)    CBG: No results for input(s): GLUCAP in the last 168 hours.  Brief HPI:   Kristine Francis is a 80 y.o. female with history of COPD, stroke, hypertension, vestibulitis, CVA who was admitted on 11/25/2020 with right-sided weakness, right gaze preference and difficulty speaking.  In ED she was noted to have difficulty handling oral secretions and had tonic-clonic seizure with loss of pulse requiring 2 minutes of CPR as well as intubation.  She received IV Keppra and CTA head/CT head was negative for LVO or acute changes.  MRI brain done revealing multiple foci of susceptibility in bilateral cerebral hemispheres more pronounced in left occipital region question chronic microhemorrhages related to HTN or amyloid  angiopathy.  She was treated with antibiotics due to concerns of aspiration pneumonia and Eliquis added due to development of A. fib.  EEG was negative for seizures and showed evidence of moderate to severe encephalopathy.  She tolerated extubation and delirium has resolved.  She was started on dysphagia to honey liquids due to acute on chronic dysphagia.  Therapy was ongoing and patient was limited by weakness, ataxia, vertigo with gait instability as well as cognitive deficits.  CIR was recommended due to functional decline.   Hospital Course: Kristine Francis was admitted to rehab 12/10/2020 for inpatient therapies to consist of PT, ST and OT at least three hours five days a week. Past admission physiatrist, therapy team and rehab RN have worked together to provide customized collaborative inpatient rehab. Blood pressures/heart rate were monitored on TID basis and amlodipine was added for better BP control.  She was maintained on Eliquis during her stay and serial CBC shows H&H improving.  Stool guaiacs x2 ordered for work-up and was heme positive stools for 2 out of 5 stools. Protonix was added for GI prophylaxis with recommendations to follow-up with PCP/GI after discharge. Serial follow-up BMET showed hyponatremia to be slowly improving.  Renal status has been monitored especially due to honey liquids on board.   Patient's p.o. intake has been good and she is able to maintain her hydration without need for IV fluids.  Husband has been educated on importance of pushing fluids to maintain adequate hydration as she did not show improvement on her MBS and currently continues on modified diet.  Urecholine was added briefly for urinary retention.  She is currently voiding without signs of retention  and Urecholine was discontinued.  She has had confusion at nights therefore melatonin was added to help with sleep-wake disruption.  She has been seizure-free during her stay.  Cognition has been improving however she  continues to have impairments in memory and recall.  She has made gains during his stay and currently requires min assist overall. She will continue to receive follow up outpatient PT, OT and ST at [facility] after discharge.    Rehab course: During patient's stay in rehab weekly team conferences were held to monitor patient's progress, set goals and discuss barriers to discharge. At admission, patient required min to max assist with ADL tasks and  Min assist with mobility. She exhibited mild dysarthria with moderate to severe impairments in STM and decreased awareness of deficits. She exhibited mild oral and moderate acute on chronic pharyngeal dysphagia. She  has had improvement in activity tolerance, balance, postural control as well as ability to compensate for deficits. she has had improvement in functional use RUE  and RLE as well as improvement in awareness. She is able to complete ADL tasks with min assist. She requires CGA  for tranfers and to ambulate 200' with use of RW. Family education was completed with husband.    Disposition: Home  Diet: Dysphagia 2, Honey liquids.   Special Instructions: 1. Limit distractions at meals. Full supervision with meals.  2.  Recommend repeat CBC and BMET in 2 weeks for monitoring of H&H as well as electrolytes. 3.  Recommend follow-up with GI for work-up of heme positive stools.   Discharge Instructions    Ambulatory referral to Neurology   Complete by: As directed    An appointment is requested in approximately: 2-3 weeks. From 12/24/20.  New onset seizures   Ambulatory referral to Physical Medicine Rehab   Complete by: As directed    1-2 weeks follow up appt     Allergies as of 12/24/2020      Reactions   Contrast Media [iodinated Diagnostic Agents] Other (See Comments)   Congestion .Marland KitchenMarland Kitchenpatient stated it irritated her eyes and voice   Iohexol Other (See Comments)   Gi upset   Lisinopril Cough      Medication List    TAKE these  medications   acetaminophen 325 MG tablet Commonly known as: TYLENOL Take 1-2 tablets (325-650 mg total) by mouth every 4 (four) hours as needed for mild pain.   amLODipine 2.5 MG tablet Commonly known as: NORVASC Take 1 tablet (2.5 mg total) by mouth daily.   apixaban 5 MG Tabs tablet Commonly known as: ELIQUIS Take 1 tablet (5 mg total) by mouth 2 (two) times daily.   atorvastatin 10 MG tablet Commonly known as: LIPITOR Take 1 tablet (10 mg total) by mouth daily.   EPINEPHrine 0.3 mg/0.3 mL Soaj injection Commonly known as: EPI-PEN Inject 0.3 mg into the muscle as needed for anaphylaxis.   levETIRAcetam 500 MG tablet Commonly known as: KEPPRA Take 1 tablet (500 mg total) by mouth 2 (two) times daily.   lip balm ointment Apply topically as needed for lip care.   melatonin 3 MG Tabs tablet Take 1 tablet (3 mg total) by mouth at bedtime.   metoprolol tartrate 25 MG tablet Commonly known as: LOPRESSOR Take 1 tablet (25 mg total) by mouth 2 (two) times daily.   multivitamin with minerals Tabs tablet Take 1 tablet by mouth daily.   pantoprazole 20 MG tablet Commonly known as: PROTONIX Take 1 tablet (20 mg total) by  mouth daily.   Thick-It Pack Generic drug: STARCH-MALTO DEXTRIN Take 1 each by mouth 4 (four) times daily as needed. Thicken liquids to honey consistency       Follow-up Information    Kirsteins, Luanna Salk, MD Follow up.   Specialty: Physical Medicine and Rehabilitation Why: Office will call you with follow up appointment Contact information: [place] [address] [phone]        [name], MD. Call.   Specialty: Family Medicine Why: for post hospital follow up Contact information: [place] [address] [phone]        [facility] Follow up.   Why: Office will call you with follow up appointment Contact information: [address]     [place] [phone]              Signed: Bary Leriche 12/27/2020, 5:14 PM

## 2020-12-24 NOTE — Progress Notes (Signed)
[place] PHYSICAL MEDICINE & REHABILITATION PROGRESS NOTE   Subjective/Complaints: Voiding well, PVR 0 off urecholine  Pt aware of d/c today but not oriented to time    ROS-  Pt denies SOB, abd pain, CP, N/V/D, constipation   Objective:   DG Swallowing Func-Speech Pathology  Result Date: 12/22/2020 Objective Swallowing Evaluation: Type of Study: MBS-Modified Barium Swallow Study  Patient Details Name: Kristine Francis MRN: [id number] Date of Birth: 02-21-1941 Today's Date: 12/22/2020 Past Medical History: Past Medical History: Diagnosis Date . Allergy  . Asthma   sees Dr. Tiajuana Amass - as child per pt . At high risk for falls   unstable gait  . Ataxia  . Cataract   removed bilaterally  . Diverticulosis  . Emphysema  . Gait abnormality 05/14/2013  Ms.Dooms a patient of Dr. Linda Hedges presented first interpreter thousand 13 with a gait dysfunction, she also had an episodic confusion and Loss device. Exam found a mild nystagmus and she was referred to ophthalmology on 03-2812, brain MRI was normal nystagmus was a secondary diagnosis to vestibulitis, ear nose and throat has followed and had seen the patient. At the time of the nystagmus the patie . GERD (gastroesophageal reflux disease)  . Hepatic steatosis  . Hyperlipidemia   on atorvastatin  . Hypertension   on losartan  . Iron deficiency anemia, unspecified  . Kidney cysts 11/16/12  small cyst on left . Nystagmus, end-position  . Osteopenia  . Renal cyst  . Stroke ([place]) 01/2017  mild  Past Surgical History: Past Surgical History: Procedure Laterality Date . ANAL RECTAL MANOMETRY N/A 12/20/2017  Procedure: ANO RECTAL MANOMETRY;  Surgeon: Mauri Pole, MD;  Location: [facility];  Service: Endoscopy;  Laterality: N/A; . BREAST BIOPSY Bilateral 1970's . CATARACT EXTRACTION Bilateral LT:02/14/13,RT:02/21/13 . CERVICAL SPINE SURGERY  2010 . COLONOSCOPY  03-12-14  per Dr. Olevia Perches, diverticulosis only, repeat 5 yrs  . EYE SURGERY Bilateral 01/2013&02/2013  left then  right . HERNIA REPAIR  Oct '14-left, remote-right  years ago right; left done '14 . SKIN CANCER EXCISION  05/13/13  FACE, basal cell . TONSILLECTOMY   HPI: 80 yo female admitted 11/25/20 after becoming unresponsive. [name], unable to use RUE. Intubated in ED. 2 minutes of CPR. MRI Multiple foci of susceptibility artifact in the bilateral cerebral hemispheres more pronounced in occipital region. Remote lacunar infarct in the right basal ganglia region, PMH: cervical fusion 2010, CVA (2018), HTN, HLD, gait abnormality, anemia, emphysema, asthma, GERD, diverticulosis, renal cyst, osteopenia. ETT 1/5-8/22. Pt admitted to [facility] 12/10/20.  Subjective: Pt awake, follows directions. Perseverative on calling out for [name] (spouse) Assessment / Plan / Recommendation CHL IP CLINICAL IMPRESSIONS 12/22/2020 Clinical Impression Pt continues to present with acute on chronic primarily pharyngeal dysphagia that is largely unchanged since last MBS (12/04/20). Pt's cervical vertebrae is severely kyphotic with noted cervical hardware (2010). Consistent penetration and silent aspiration (PAS scores 6 and 8) was noted with trials of nectar and thin barium. Cued coughing and throat clearing was ineffective to fully clear aspirates. Vallecular and pyriform sinus residue also noted across textures, but more prominently with thicker textures. Honey thick barium resulted in a more timely initiation of swallow, and no airway intrusion during the swallow. However, on later imaging, a moderate degree of aspiration noted, which SLP suspects was from vallecular residue of honey barium aspirated after the swallow. During additional trials of honey, when pt was cued to perform 2 extra dry swallows, vallecular residue was minimized and no  airway intrusion noted. Dysphagia 3 (mechanical soft) textures were masticated efficiently, but did result in significant vallecular residue that was difficult for pt to clear even with extra dry swallows. Given  results of today's study, it is recommended that pt continue her current Dysphagia 2 (minced/chopped) texture diet with honey thick liquids. Medications should be crushed. Pt should perform a minimum of 2 extra dry swallows for every sip/bite. Follow up interventions may consider [place] Water Protocol to assess tolerance of thin with strict oral care considerations, given that pt's dysphagia is partially chronic. Continue per current plan of care. SLP Visit Diagnosis Dysphagia, pharyngeal phase (R13.13) Attention and concentration deficit following -- Frontal lobe and executive function deficit following -- Impact on safety and function Moderate aspiration risk   CHL IP TREATMENT RECOMMENDATION 12/04/2020 Treatment Recommendations Therapy as outlined in treatment plan below   Prognosis 12/04/2020 Prognosis for Safe Diet Advancement (No Data) Barriers to Reach Goals (No Data) Barriers/Prognosis Comment -- CHL IP DIET RECOMMENDATION 12/22/2020 SLP Diet Recommendations Dysphagia 2 (Fine chop) solids;Honey thick liquids Liquid Administration via Cup;No straw Medication Administration Crushed with puree Compensations Slow rate;Small sips/bites;Multiple dry swallows after each bite/sip;Clear throat intermittently;Minimize environmental distractions Postural Changes Remain semi-upright after after feeds/meals (Comment);Seated upright at 90 degrees   CHL IP OTHER RECOMMENDATIONS 12/22/2020 Recommended Consults -- Oral Care Recommendations Oral care BID Other Recommendations Remove water pitcher;Prohibited food (jello, ice cream, thin soups);Order thickener from pharmacy;Have oral suction available   CHL IP FOLLOW UP RECOMMENDATIONS 12/08/2020 Follow up Recommendations Inpatient Rehab   CHL IP FREQUENCY AND DURATION 12/04/2020 Speech Therapy Frequency (ACUTE ONLY) min 2x/week Treatment Duration 2 weeks      CHL IP ORAL PHASE 12/22/2020 Oral Phase Impaired Oral - Pudding Teaspoon -- Oral - Pudding Cup -- Oral - Honey Teaspoon -- Oral  - Honey Cup Decreased bolus cohesion Oral - Nectar Teaspoon NT Oral - Nectar Cup Decreased bolus cohesion Oral - Nectar Straw -- Oral - Thin Teaspoon NT Oral - Thin Cup Decreased bolus cohesion Oral - Thin Straw -- Oral - Puree -- Oral - Mech Soft WFL Oral - Regular -- Oral - Multi-Consistency -- Oral - Pill -- Oral Phase - Comment --  CHL IP PHARYNGEAL PHASE 12/22/2020 Pharyngeal Phase Impaired Pharyngeal- Pudding Teaspoon -- Pharyngeal -- Pharyngeal- Pudding Cup -- Pharyngeal -- Pharyngeal- Honey Teaspoon -- Pharyngeal -- Pharyngeal- Honey Cup Reduced epiglottic inversion;Reduced airway/laryngeal closure;Penetration/Apiration after swallow;Pharyngeal residue - valleculae;Reduced pharyngeal peristalsis Pharyngeal Material enters airway, passes BELOW cords without attempt by patient to eject out (silent aspiration) Pharyngeal- Nectar Teaspoon NT Pharyngeal -- Pharyngeal- Nectar Cup Reduced pharyngeal peristalsis;Reduced airway/laryngeal closure;Penetration/Aspiration during swallow;Pharyngeal residue - valleculae;Pharyngeal residue - pyriform;Reduced epiglottic inversion Pharyngeal Material enters airway, passes BELOW cords without attempt by patient to eject out (silent aspiration) Pharyngeal- Nectar Straw -- Pharyngeal -- Pharyngeal- Thin Teaspoon NT Pharyngeal -- Pharyngeal- Thin Cup Reduced pharyngeal peristalsis;Penetration/Aspiration during swallow;Reduced airway/laryngeal closure;Reduced epiglottic inversion;Compensatory strategies attempted (with notebox) Pharyngeal Material enters airway, passes BELOW cords without attempt by patient to eject out (silent aspiration) Pharyngeal- Thin Straw -- Pharyngeal -- Pharyngeal- Puree -- Pharyngeal -- Pharyngeal- Mechanical Soft Reduced pharyngeal peristalsis;Pharyngeal residue - valleculae;Reduced epiglottic inversion Pharyngeal -- Pharyngeal- Regular -- Pharyngeal -- Pharyngeal- Multi-consistency -- Pharyngeal -- Pharyngeal- Pill -- Pharyngeal -- Pharyngeal Comment --   CHL IP CERVICAL ESOPHAGEAL PHASE 12/22/2020 Cervical Esophageal Phase Impaired Pudding Teaspoon -- Pudding Cup -- Honey Teaspoon -- Honey Cup -- Nectar Teaspoon -- Nectar Cup -- Nectar Straw -- Thin Teaspoon -- Thin Cup --  Thin Straw -- Puree -- Mechanical Soft -- Regular -- Multi-consistency -- Pill -- Cervical Esophageal Comment -- Arbutus Leas 12/22/2020, 9:45 AM              No results for input(s): WBC, HGB, HCT, PLT in the last 72 hours. No results for input(s): NA, K, CL, CO2, GLUCOSE, BUN, CREATININE, CALCIUM in the last 72 hours.  Intake/Output Summary (Last 24 hours) at 12/24/2020 0816 Last data filed at 12/23/2020 1815 Gross per 24 hour  Intake 240 ml  Output -  Net 240 ml     Pressure Injury 12/10/20 Sacrum Mid Stage 2 -  Partial thickness loss of dermis presenting as a shallow open injury with a red, pink wound bed without slough. Small circular area at tail bone, center (Active)  12/10/20 1505  Location: Sacrum  Location Orientation: Mid  Staging: Stage 2 -  Partial thickness loss of dermis presenting as a shallow open injury with a red, pink wound bed without slough.  Wound Description (Comments): Small circular area at tail bone, center  Present on Admission: Yes    Physical Exam: Vital Signs Blood pressure 118/60, pulse 65, temperature 97.6 F (36.4 C), resp. rate 17, height 5\' 1"  (1.549 m), weight 38.2 kg, SpO2 99 %.    General: No acute distress Mood and affect are appropriate Heart: Regular rate and rhythm no rubs murmurs or extra sounds Lungs: Clear to auscultation, breathing unlabored, no rales or wheezes Abdomen: Positive bowel sounds, soft nontender to palpation, nondistended Extremities: No clubbing, cyanosis, or edema Skin: No evidence of breakdown, no evidence of rash   Assessment/Plan:  1. Functional deficits due to hypoxic encephalopathy  Stable for D/C today F/u PCP in 3-4 weeks F/u PM&R 2 weeks F/u neuro 2-3 wks See D/C summary  See D/C  instructions  Care Tool:  Bathing    Body parts bathed by patient: Right arm,Left arm,Chest,Front perineal area,Abdomen,Face,Right upper leg,Left upper leg,Right lower leg,Left lower leg,Buttocks   Body parts bathed by helper: Buttocks     Bathing assist Assist Level: Contact Guard/Touching assist     Upper Body Dressing/Undressing Upper body dressing   What is the patient wearing?: Pull over shirt    Upper body assist Assist Level: Supervision/Verbal cueing    Lower Body Dressing/Undressing Lower body dressing      What is the patient wearing?: Pants,Incontinence brief     Lower body assist Assist for lower body dressing: Minimal Assistance - Patient > 75%     Toileting Toileting    Toileting assist Assist for toileting: Minimal Assistance - Patient > 75%     Transfers Chair/bed transfer  Transfers assist     Chair/bed transfer assist level: Contact Guard/Touching assist Chair/bed transfer assistive device: Programmer, multimedia   Ambulation assist      Assist level: Minimal Assistance - Patient > 75% Assistive device: Walker-rolling Max distance: 215ft   Walk 10 feet activity   Assist     Assist level: Contact Guard/Touching assist Assistive device: Walker-rolling   Walk 50 feet activity   Assist Walk 50 feet with 2 turns activity did not occur: Safety/medical concerns  Assist level: Contact Guard/Touching assist Assistive device: Walker-rolling    Walk 150 feet activity   Assist Walk 150 feet activity did not occur: Safety/medical concerns  Assist level: Minimal Assistance - Patient > 75% Assistive device: Walker-rolling    Walk 10 feet on uneven surface  activity   Assist Walk 10 feet on  uneven surfaces activity did not occur: Safety/medical concerns   Assist level: Minimal Assistance - Patient > 75% Assistive device: Aeronautical engineer Will patient use wheelchair at discharge?:  No Type of Wheelchair: Manual    Wheelchair assist level: Maximal Assistance - Patient 25 - 49% Max wheelchair distance: 75    Wheelchair 50 feet with 2 turns activity    Assist        Assist Level: Maximal Assistance - Patient 25 - 49%   Wheelchair 150 feet activity     Assist  Wheelchair 150 feet activity did not occur: Safety/medical concerns       Blood pressure 118/60, pulse 65, temperature 97.6 F (36.4 C), resp. rate 17, height 5\' 1"  (1.549 m), weight 38.2 kg, SpO2 99 %.  Medical Problem List and Plan: 1. hypoxic encephalopathy  - moderate to severe secondary to tonic clonic seizure, amyloid angiopathy,              -d/c home today  2.  Antithrombotics: -DVT/anticoagulation:  Pharmaceutical: Other (comment)--on Eliquis- also for Afib with RVR             -antiplatelet therapy: N/a 3. Pain Management: N/A 4. Mood: LCSW to follow for evaluation and support.              -antipsychotic agents: N/A 5. Neuropsych: This patient is not fully capable of making decisions on her own behalf.  1/23- very poor memory- con't regimen 6. Skin/Wound Care: Routine pressure relief measures.  7. Fluids/Electrolytes/Nutrition: Monitor I/O. Check lytes in am.  8. PAF: New onset--continue Metoprolol and eliquis BID. Vitals:   12/23/20 1919 12/24/20 0328  BP: 124/72 118/60  Pulse: 64 65  Resp: 20 17  Temp: 98.1 F (36.7 C) 97.6 F (36.4 C)  SpO2: 100% 99%   Rate controlled but BP up, add low dose amlodipine   2/1: BP better controlled with amlodipine. 9. Dysphagia: On D2, honey liquids. Monitor hydration with check of lytes in am and next week.discussed normal progression to thin liquids  1/22- tolerating breakfast- con't regimen and recheck Monday  1/23- pt asking for coffee- will try to arrange thickened coffee for pt  10.  Hyponateremia: Will recheck Na in am--question dilutional  1/23- Na 132- will recheck labs next week, esp since on thickened liquids.    1/31: Na  reviewed, up to 134, repeat weekly 11. Acute on chronic Iron deficiency anemia:H/H down from 11-->8.7. Check stool guaiacs. Monitor for signs of bleeding. Will add iron supplement. Stools heme + monitor hgb on Eliquis - add protonix - GI f/u as OP ,   1/31: Hgb reviewed, 8.9 up from 8.4, repeat weekly 12. Leukocytosis:due to PNA  Resolved 13. Seizure prophylaxis: Stable on Keppra. Tolerating without SE.   14.  Urinary retention added urecholine UA neg - improved , will d/c urecholine and observe 15.  Hypoalbuminemia- will add Prostat- enc hi protein food sources 16.  Insomnia - some confusion at noc, may be anticholinergic effect of trazodone, switch to melatonin LOS: 14 days A FACE TO [facility] 12/24/2020, 8:16 AM

## 2020-12-24 NOTE — Progress Notes (Signed)
Patient discharged home with husband, all belongings sent with patient. No complications noted at this time.  Kristine Francis

## 2020-12-27 DIAGNOSIS — R195 Other fecal abnormalities: Secondary | ICD-10-CM

## 2020-12-27 DIAGNOSIS — E871 Hypo-osmolality and hyponatremia: Secondary | ICD-10-CM

## 2020-12-27 DIAGNOSIS — D649 Anemia, unspecified: Secondary | ICD-10-CM

## 2020-12-29 ENCOUNTER — Telehealth (HOSPITAL_COMMUNITY): Payer: Self-pay

## 2020-12-29 NOTE — Telephone Encounter (Signed)
Pharmacy Transitions of Care Follow-up Telephone Call  Date of discharge: 12/24/20  Discharge Diagnosis: Encephalopathy  How have you been since you were released from the hospital? Spoke with husband. Patient is having memory struggles but is doing well with eating and living in the house. Struggles to walk so trying to get a wheelchair to help her outside the house. Recommended calling PT office to see if they could give her a recommendation. Patient aware of s/sw of bleeding to watch for   Medication changes made at discharge: yes  Medication changes obtained and verified? yes    Medication Accessibility:  Home Pharmacy: [facility]  Was the patient provided with refills on discharged medications? no  . Is the patient able to afford medications? yes    Medication Review:  APIXABEN (ELIQUIS)  Apixaban 5 mg BID 12/24/20  - Discussed importance of taking medication around the same time everyday  - Advised patient of medications to avoid (NSAIDs, ASA)  - Educated that Tylenol (acetaminophen) will be the preferred analgesic to prevent risk of bleeding  - Emphasized importance of monitoring for signs and symptoms of bleeding (abnormal bruising, prolonged bleeding, nose bleeds, bleeding from gums, discolored urine, black tarry stools)   Follow-up Appointments:  Has F/U with Dr. Sarajane Jews in [facility] on 12/30/20 and with OT in Rehabilitation on 01/04/21   If their condition worsens, is the pt aware to call PCP or go to the Emergency Dept.? yes  Final Patient Assessment: Patient is doing well. Has son and husband helping with her medications. Has follow ups scheduled and will get refills at next follow up.

## 2020-12-30 ENCOUNTER — Ambulatory Visit (INDEPENDENT_AMBULATORY_CARE_PROVIDER_SITE_OTHER): Payer: Medicare Other | Admitting: Family Medicine

## 2020-12-30 ENCOUNTER — Other Ambulatory Visit: Payer: Self-pay

## 2020-12-30 ENCOUNTER — Encounter: Payer: Self-pay | Admitting: Family Medicine

## 2020-12-30 VITALS — BP 110/78 | Temp 98.6°F | Wt 87.6 lb

## 2020-12-30 DIAGNOSIS — J9601 Acute respiratory failure with hypoxia: Secondary | ICD-10-CM

## 2020-12-30 DIAGNOSIS — G9340 Encephalopathy, unspecified: Secondary | ICD-10-CM | POA: Diagnosis not present

## 2020-12-30 DIAGNOSIS — J449 Chronic obstructive pulmonary disease, unspecified: Secondary | ICD-10-CM

## 2020-12-30 DIAGNOSIS — I480 Paroxysmal atrial fibrillation: Secondary | ICD-10-CM | POA: Diagnosis not present

## 2020-12-30 DIAGNOSIS — G119 Hereditary ataxia, unspecified: Secondary | ICD-10-CM | POA: Diagnosis not present

## 2020-12-30 DIAGNOSIS — T17908S Unspecified foreign body in respiratory tract, part unspecified causing other injury, sequela: Secondary | ICD-10-CM

## 2020-12-30 DIAGNOSIS — G40409 Other generalized epilepsy and epileptic syndromes, not intractable, without status epilepticus: Secondary | ICD-10-CM | POA: Diagnosis not present

## 2020-12-30 LAB — CBC WITH DIFFERENTIAL/PLATELET
Basophils Absolute: 0.1 10*3/uL (ref 0.0–0.1)
Basophils Relative: 0.8 % (ref 0.0–3.0)
Eosinophils Absolute: 0.2 10*3/uL (ref 0.0–0.7)
Eosinophils Relative: 2.8 % (ref 0.0–5.0)
HCT: 32.1 % — ABNORMAL LOW (ref 36.0–46.0)
Hemoglobin: 10.9 g/dL — ABNORMAL LOW (ref 12.0–15.0)
Lymphocytes Relative: 18.2 % (ref 12.0–46.0)
Lymphs Abs: 1.4 10*3/uL (ref 0.7–4.0)
MCHC: 34.1 g/dL (ref 30.0–36.0)
MCV: 86.7 fl (ref 78.0–100.0)
Monocytes Absolute: 0.7 10*3/uL (ref 0.1–1.0)
Monocytes Relative: 9.4 % (ref 3.0–12.0)
Neutro Abs: 5.2 10*3/uL (ref 1.4–7.7)
Neutrophils Relative %: 68.8 % (ref 43.0–77.0)
Platelets: 332 10*3/uL (ref 150.0–400.0)
RBC: 3.7 Mil/uL — ABNORMAL LOW (ref 3.87–5.11)
RDW: 15.1 % (ref 11.5–15.5)
WBC: 7.6 10*3/uL (ref 4.0–10.5)

## 2020-12-30 LAB — BASIC METABOLIC PANEL
BUN: 11 mg/dL (ref 6–23)
CO2: 29 mEq/L (ref 19–32)
Calcium: 9.2 mg/dL (ref 8.4–10.5)
Chloride: 100 mEq/L (ref 96–112)
Creatinine, Ser: 0.56 mg/dL (ref 0.40–1.20)
GFR: 87.27 mL/min (ref >60.00)
Glucose, Bld: 96 mg/dL (ref 70–99)
Potassium: 4.7 mEq/L (ref 3.5–5.1)
Sodium: 135 mEq/L (ref 135–145)

## 2020-12-30 NOTE — Progress Notes (Signed)
   Subjective:    Patient ID: Kristine Francis, female    DOB: 05/15/1941, 80 y.o.   MRN: [id number]  HPI Here with her husband to follow up on a hospital stay from 11-25-20 to 12-10-20 for a grand mal seizure which likely resulted from a TIA. She was exhibiting stroke-like symptoms on arrival to the ED and then had the seizure in the ED. This led to a cardiac arrest and she required CPR to get it started again. A brain MRI showed many areas of chronic microhemorrhages but no acute strokes. She remained encephalopathic for several weeks, but she slowly improved. She was found to have paroxysmal atrial fibrillation and was started on Eliquis. Her BP and heart rate have been well controlled. She then stayed in rehab under the care of Dr. Willy Eddy until she was able to go home on 12-24-20. Since then she has done well, she is eating foods and drinking thickened liquids well. She sleeps well on Melatonin. No further signs of seizure activity.    Review of Systems  Constitutional: Negative.   Respiratory: Negative.   Cardiovascular: Negative.   Gastrointestinal: Negative.   Genitourinary: Negative.   Musculoskeletal: Positive for gait problem.  Neurological: Positive for weakness.       Objective:   Physical Exam Constitutional:      Comments: Frail. Walks with a walker   Cardiovascular:     Rate and Rhythm: Normal rate.     Pulses: Normal pulses.     Heart sounds: Normal heart sounds.  Pulmonary:     Effort: Pulmonary effort is normal. No respiratory distress.     Breath sounds: No stridor. No rhonchi or rales.     Comments: Soft scattered wheezes  Neurological:     Mental Status: She is alert and oriented to person, place, and time. Mental status is at baseline.           Assessment & Plan:  She is doing well after a grand mal seizure which caused encephalopathy. She is getting PT, OT, and ST at home. She is taking Keppra. She is scheduled to see Dr. Larey Seat (Neurology) on  02-01-21. Her PAF is well controlled. We will get a CBC to follow anemia and a BMET to follow renal function and electrolytes.  [name], MD

## 2021-01-04 ENCOUNTER — Encounter: Payer: Self-pay | Admitting: Speech Pathology

## 2021-01-04 ENCOUNTER — Ambulatory Visit: Payer: Medicare Other

## 2021-01-04 ENCOUNTER — Other Ambulatory Visit: Payer: Self-pay

## 2021-01-04 ENCOUNTER — Encounter: Payer: Self-pay | Admitting: Occupational Therapy

## 2021-01-04 ENCOUNTER — Ambulatory Visit: Payer: Medicare Other | Admitting: Speech Pathology

## 2021-01-04 ENCOUNTER — Ambulatory Visit: Payer: Medicare Other | Attending: Physician Assistant | Admitting: Occupational Therapy

## 2021-01-04 DIAGNOSIS — R270 Ataxia, unspecified: Secondary | ICD-10-CM | POA: Diagnosis not present

## 2021-01-04 DIAGNOSIS — R2689 Other abnormalities of gait and mobility: Secondary | ICD-10-CM | POA: Diagnosis not present

## 2021-01-04 DIAGNOSIS — R41841 Cognitive communication deficit: Secondary | ICD-10-CM

## 2021-01-04 DIAGNOSIS — R260 Ataxic gait: Secondary | ICD-10-CM | POA: Insufficient documentation

## 2021-01-04 DIAGNOSIS — R1312 Dysphagia, oropharyngeal phase: Secondary | ICD-10-CM

## 2021-01-04 DIAGNOSIS — R41840 Attention and concentration deficit: Secondary | ICD-10-CM | POA: Insufficient documentation

## 2021-01-04 DIAGNOSIS — R2681 Unsteadiness on feet: Secondary | ICD-10-CM | POA: Diagnosis not present

## 2021-01-04 DIAGNOSIS — G40409 Other generalized epilepsy and epileptic syndromes, not intractable, without status epilepticus: Secondary | ICD-10-CM | POA: Diagnosis not present

## 2021-01-04 DIAGNOSIS — R471 Dysarthria and anarthria: Secondary | ICD-10-CM

## 2021-01-04 DIAGNOSIS — M6281 Muscle weakness (generalized): Secondary | ICD-10-CM

## 2021-01-04 DIAGNOSIS — G9340 Encephalopathy, unspecified: Secondary | ICD-10-CM | POA: Insufficient documentation

## 2021-01-04 DIAGNOSIS — R278 Other lack of coordination: Secondary | ICD-10-CM | POA: Insufficient documentation

## 2021-01-04 NOTE — Therapy (Signed)
[place]. [place], [place], [address] Phone: [phone]   Fax:  [phone]  Physical Therapy Evaluation  Patient Details  Name: Kristine Francis MRN: [id number] Date of Birth: 1941/01/24 Referring Provider (PT): Cathlyn Parsons, PA-C   Encounter Date: 01/04/2021   PT End of Session - 01/04/21 1615    Visit Number 1    Number of Visits 17    Date for PT Re-Evaluation 03/01/21    Authorization Type Medicare Primary; [employer] Secondary (no PTA); 10th visit PN    PT Start Time 1400    PT Stop Time 1445    PT Time Calculation (min) 45 min    Equipment Utilized During Treatment Gait belt   FWW, manual WC   Activity Tolerance Patient tolerated treatment well    Behavior During Therapy WFL for tasks assessed/performed           Past Medical History:  Diagnosis Date  . Allergy   . Asthma    sees Dr. Tiajuana Amass - as child per pt  . At high risk for falls    unstable gait   . Ataxia   . Cataract    removed bilaterally   . Diverticulosis   . Emphysema   . Gait abnormality 05/14/2013   Ms.Gabor a patient of Dr. Linda Hedges presented first interpreter thousand 13 with a gait dysfunction, she also had an episodic confusion and Loss device. Exam found a mild nystagmus and she was referred to ophthalmology on 03-2812, brain MRI was normal nystagmus was a secondary diagnosis to vestibulitis, ear nose and throat has followed and had seen the patient. At the time of the nystagmus the patie  . GERD (gastroesophageal reflux disease)   . Hepatic steatosis   . Hyperlipidemia    on atorvastatin   . Hypertension    on losartan   . Iron deficiency anemia, unspecified   . Kidney cysts 11/16/12   small cyst on left  . Nystagmus, end-position   . Osteopenia   . Renal cyst   . Stroke ([place]) 01/2017   mild     Past Surgical History:  Procedure Laterality Date  . ANAL RECTAL MANOMETRY N/A 12/20/2017   Procedure: ANO RECTAL  MANOMETRY;  Surgeon: Mauri Pole, MD;  Location: [facility];  Service: Endoscopy;  Laterality: N/A;  . BREAST BIOPSY Bilateral 1970's  . CATARACT EXTRACTION Bilateral LT:02/14/13,RT:02/21/13  . CERVICAL SPINE SURGERY  2010  . COLONOSCOPY  03-12-14   per Dr. Olevia Perches, diverticulosis only, repeat 5 yrs   . EYE SURGERY Bilateral 01/2013&02/2013   left then right  . HERNIA REPAIR  Oct '14-left, remote-right   years ago right; left done '14  . SKIN CANCER EXCISION  05/13/13   FACE, basal cell  . TONSILLECTOMY      There were no vitals filed for this visit.    Subjective Assessment - 01/04/21 1406    Subjective Pt was recently discharge from IP rehab on 12/24/2020 post TIA with [facility] Seizure leading to encephalopathy. PMH includes previous CVA in 2018 followed by physical therapy treatment for cerebellar ataxia, abnormal gait, dizziness in 2020 most recently.  Pt has been home from hospital for about 1 week. She Has been using walker or touching things for balance to get around at home.    Patient is accompained by: Family member   [name], husband   Pertinent History HTN, renal cyst, osteopenia, nystagmus, kidney cysts, iron deficiency anemia, hepatic  steatosis, gait abnormality    Limitations Walking;Standing    Patient Stated Goals To walk without falling and to walk more normally    Currently in Pain? No/denies    Pain Score 0-No pain              [facility] PT Assessment - 01/04/21 1409      Assessment   Medical Diagnosis G93.40 (ICD-10-CM) - Encephalopathy, unspecified    Referring Provider (PT) Angiulli, Lavon Paganini, PA-C    Hand Dominance Right    Next MD Visit Plan to follow up with neuro Dr. Larey Seat (Neurology) on 02-01-21    Prior Therapy Yes, IP rehab      Precautions   Precautions Fall   Seizure     Balance Screen   Has the patient fallen in the past 6 months No    Has the patient had a decrease in activity level because of a fear of falling?  No    Is the  patient reluctant to leave their home because of a fear of falling?  No      Home Environment   Home Equipment --   walking stick, FWW, WC   Additional Comments House, with husband. 1 step to get in/out without HR but uses dorr for balance. 1 level home      Prior Function   Vocation Retired    Leisure Going to church and [employer], tennis 1x/week      Cognition   Overall Cognitive Status Within Functional Limits for tasks assessed    Memory Impaired    Memory Impairment Decreased short term Therapist, nutritional and Movement Description Gross motor movements impaired due to significant truncal and LE ataxia along with mild R hemiparesis    Heel Shin Test dysmetria, grossly ataxic quality of movement      Posture/Postural Control   Postural Limitations Rounded Shoulders;Forward head;Decreased lumbar lordosis;Increased thoracic kyphosis;Posterior pelvic tilt      ROM / Strength   AROM / PROM / Strength Strength      Strength   Right Hip Flexion 4-/5    Right Hip ABduction 3+/5    Right Hip ADduction 3+/5    Left Hip Flexion 4/5    Left Hip ABduction 4-/5    Left Hip ADduction 4-/5    Right Knee Flexion 4-/5    Right Knee Extension 4+/5    Left Knee Flexion 4/5    Left Knee Extension 4+/5    Right Ankle Dorsiflexion 4-/5    Right Ankle Plantar Flexion 4-/5    Left Ankle Dorsiflexion 4-/5    Left Ankle Plantar Flexion 4-/5      Transfers   Sit to Stand 6: Modified independent (Device/Increase time);5: Supervision;4: Min guard    Five time sit to stand comments  5TSTS: 21 seconds with FWW, gait belt and CGA. Moderate truncal ataxia. decreased eccentric control. Requires CGA for safety, cues for safe hand placement with transfers.      Ambulation/Gait   Ambulation Distance (Feet) --   25 ft x 2   Assistive device Rolling walker    Gait Pattern Step-through pattern;Ataxic;Decreased trunk rotation;Trunk flexed;Wide base of support    Stairs --   to be  assessed   Gait Comments --      Berg Balance Test   Sit to Stand Able to stand  independently using hands    Standing Unsupported Able to stand 2 minutes with supervision  Sitting with Back Unsupported but Feet Supported on Floor or Stool Able to sit safely and securely 2 minutes    Stand to Sit Uses backs of legs against chair to control descent    Transfers Able to transfer with verbal cueing and /or supervision    Standing Unsupported with Eyes Closed Able to stand 10 seconds with supervision    Standing Unsupported with Feet Together Needs help to attain position but able to stand for 30 seconds with feet together    From Standing, Reach Forward with Outstretched Arm Reaches forward but needs supervision    From Standing Position, Pick up Object from Floor Able to pick up shoe, needs supervision    From Standing Position, Turn to Look Behind Over each Shoulder Needs supervision when turning    Turn 360 Degrees Needs assistance while turning    Standing Unsupported, Alternately Place Feet on Step/Stool Needs assistance to keep from falling or unable to try    Standing Unsupported, One Foot in [employer] balance while stepping or standing    Standing on One Leg Tries to lift leg/unable to hold 3 seconds but remains standing independently    Total Score 24    Berg comment: 24/56. <36 indicates high risk of falls.                      Objective measurements completed on examination: See above findings.               PT Education - 01/04/21 1700    Education Details Falls prevention and safe transfers in the home, PT POC, Initial HEP: Access Code: [email or web address] Supine Bridge - 1 x daily - 5 x weekly - 2 sets - 10 reps  Sit to Stand with Counter Support - 1 x daily - 5 x weekly - 2 sets - 10 reps  Seated March with Resistance - 1 x daily - 5 x weekly - 2 sets - 10 reps  Seated Hip Abduction with Resistance - 1 x daily - 5 x weekly - 2 sets - 10 reps    Person(s)  Educated Patient;Spouse    Methods Explanation;Demonstration;Handout    Comprehension Verbalized understanding;Returned demonstration;Need further instruction            PT Short Term Goals - 01/04/21 1639      PT SHORT TERM GOAL #1   Title Pt and caregiver/spouse independent with initiating initial HEP    Time 2    Period Weeks    Status New    Target Date 01/18/21      PT SHORT TERM GOAL #2   Title Pt will improve five time sit to stand from mat, no UE support to </= 15 seconds but with improved anterior weight shift and decreased posterior LOB    Time 4    Period Weeks    Status New    Target Date 02/01/21      PT SHORT TERM GOAL #3   Title Pt will demo improved static standing balance to at least 1 minute with close supervision and minimal sway, without LOB    Time 4    Period Weeks    Status New    Target Date 02/01/21      PT SHORT TERM GOAL #4   Title Pt and caregiver will demo good understanding of falls prevention and safe functional mobility within the home to prevent falls    Time 2    Period Weeks  Status New    Target Date 01/18/21             PT Long Term Goals - 01/04/21 1640      PT LONG TERM GOAL #1   Title Pt will demonstrate independence with final HEP    Time 8    Period Weeks    Status New    Target Date 03/01/21      PT LONG TERM GOAL #2   Title Pt will improve five time sit to stand without use of UE to </= 13 seconds and no posterior LOB    Time 8    Period Weeks    Status New    Target Date 03/01/21      PT LONG TERM GOAL #3   Title Pt will demo BLE strength at least 4+/5    Time 8    Period Weeks    Status New    Target Date 03/01/21      PT LONG TERM GOAL #4   Title Pt will improve BERG by at least 10  points to indicate decreased falls risk    Baseline 24/56    Time 8    Period Weeks    Status New    Target Date 03/01/21      PT LONG TERM GOAL #5   Title Pt will ambulate and complete direction changes with least  restrictive assistive device safely without LOB to facilitate improved safety with community negotiation    Time 8    Period Weeks    Status New    Target Date 03/01/21                  Plan - 01/04/21 1616    Clinical Impression Statement Pt is a 80 year old female referred to physical therapy for evaluation post encephalopathy following TIA and grand mal seizure January 2021. History is significant for previous PT treatment in 2020 for cerebellar ataxia, sensory neuropathy, and sensory ataxia following CVA in 2018.   The following deficits were noted during pt's exam: impaired posture and postural control, impaired coordination, timing and sequencing, impaired balance, ataxic gait, muscle weakness (right UE/LE>Left), general deconditioning, and impaired balance. Kristine Francis currently presents as a significant falls risk at this time scoring 24/56 on the Berg and 5 Times Sit to Stand of 21 seconds with UE support and close guarding for safety. Her movements are grossly ataxia and uncoordinated. She repeatedly demonstrated posterior LOB with sit > stand and required frequent reminders for hand placement with transfers for safety.  She is unable to safely take a step without walker demonstrating significant LOB (with close guarding, min to mod A needed). However she reports frequently attempting to walk distances in the home while holding on to furniture/walls for balance and no falls reported since being home or prior to recent hospitalization.  With ambulating with forward wheeled walker, Kristine Francis demonstrated abnormal gait with gross instability, moderate deviations from straight path, forward flexed and difficulty with pacing. Pt has some memory deficits and seems to have overall decreased insight into limitations at this time  requiring close supervision to assistance for safe functional mobility.  Pt would benefit from skilled PT to address these impairments and functional limitations to maximize  functional strength,  mobility independence and reduce falls risk.    Personal Factors and Comorbidities Comorbidity 3+    Comorbidities HTN, renal cyst, osteopenia, nystagmus, kidney cysts, iron deficiency anemia, hepatic steatosis, gait abnormality.    Examination-Activity  Limitations Locomotion Level;Transfers;Stairs;Toileting    Examination-Participation Restrictions Church;Community Activity;Cleaning    Stability/Clinical Decision Making Evolving/Moderate complexity    Clinical Decision Making Moderate    Rehab Potential Fair    PT Frequency 2x / week    PT Duration 8 weeks    PT Treatment/Interventions ADLs/Self Care Home Management;Aquatic Therapy;DME Instruction;Gait training;Stair training;Functional mobility training;Therapeutic activities;Therapeutic exercise;Balance training;Neuromuscular re-education;Patient/family education;Vestibular;Visual/perceptual remediation/compensation;Manual techniques;Taping    PT Next Visit Plan Check HEP. Progressive TE and balance/neurore-ed exercises as tolerated. Frequent cues for safety. Standing balance/vestibular/multi-sensory balance activities, postural exercises, dynamic balance/ balance recovery.    Consulted and Agree with Plan of Care Patient , spouse david          Patient will benefit from skilled therapeutic intervention in order to improve the following deficits and impairments:  Abnormal gait,Decreased balance,Decreased coordination,Decreased strength,Difficulty walking,Impaired tone,Impaired UE functional use,Impaired vision/preception,Postural dysfunction,Decreased mobility,Decreased safety awareness,Decreased endurance  Visit Diagnosis: Encephalopathy - Plan: PT plan of care cert/re-cert  Muscle weakness (generalized) - Plan: PT plan of care cert/re-cert  Other abnormalities of gait and mobility - Plan: PT plan of care cert/re-cert  Other lack of coordination - Plan: PT plan of care cert/re-cert  Ataxic gait - Plan: PT plan  of care cert/re-cert  Grand mal seizure [facility]) - Plan: PT plan of care cert/re-cert  Unsteadiness on feet - Plan: PT plan of care cert/re-cert     Problem List Patient Active Problem List   Diagnosis Date Noted  . PAF (paroxysmal atrial fibrillation) ([place]) 12/30/2020  . Heme + stool 12/27/2020  . Hyponatremia 12/27/2020  . Anemia 12/27/2020  . Protein-calorie malnutrition, severe 12/16/2020  . Encephalopathy 12/10/2020  . Grand mal seizure ([place]) 12/02/2020  . COPD (chronic obstructive pulmonary disease) ([place]) 12/02/2020  . Malnutrition of moderate degree 11/28/2020  . Acute encephalopathy   . Acute respiratory failure with hypoxia ([place])   . Aspiration into airway   . Cardiac arrest ([place])   . Groin mass 08/20/2020  . Falls infrequently 03/27/2019  . Sensory neuropathy 02/01/2019  . 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 Cerebellar ataxia ([place]) 08/23/2018  . Sensory polyneuropathy 08/23/2018  . Incontinence of feces   . Visual disturbances 02/06/2017  . Essential hypertension 11/07/2016  . Hyperlipidemia 04/24/2015  . Dysuria 02/11/2015  . Acute cystitis without hematuria 02/11/2015  . Chest pain 02/06/2015  . Left inguinal hernia 08/02/2013  . Gait abnormality 05/14/2013  . Gait disorder   . Nystagmus, end-position   . Routine health maintenance 12/13/2011  . LEG CRAMPS, NOCTURNAL 12/07/2010  . CALLUS, TOE 03/19/2010  . hip pain 03/19/2010  . ATAXIA 11/17/2008  . CERVICAL RADICULOPATHY, RIGHT 10/21/2008  . DIVERTICULOSIS OF COLON 08/14/2008  . RENAL CYST 08/14/2008  . ANEMIA-IRON DEFICIENCY 06/16/2007  . Asthma 06/16/2007  . GERD 06/16/2007  . OSTEOPENIA 06/16/2007    Hall Busing, PT, DPT 01/04/2021, 5:09 PM  [place]. [place], [place], [address] Phone: [phone]   Fax:  [phone]  Name: Kristine Francis MRN: [id number] Date of Birth: 01/06/1941

## 2021-01-04 NOTE — Patient Instructions (Addendum)
Continue with dysphagia exercises at home.   Continue honey thick, minced and moist diet at home.

## 2021-01-04 NOTE — Therapy (Signed)
[place]. [place], [place], [address] Phone: [phone]   Fax:  [phone]  Speech Language Pathology Evaluation  Patient Details  Name: Kristine Francis MRN: [id number] Date of Birth: 1941-09-01 No data recorded  Encounter Date: 01/04/2021   End of Session - 01/04/21 1633    Visit Number 1    Number of Visits 25    Date for SLP Re-Evaluation 04/03/21    SLP Start Time 3    SLP Stop Time  1628    SLP Time Calculation (min) 54 min    Activity Tolerance Patient tolerated treatment well           Past Medical History:  Diagnosis Date  . Allergy   . Asthma    sees Dr. Tiajuana Amass - as child per pt  . At high risk for falls    unstable gait   . Ataxia   . Cataract    removed bilaterally   . Diverticulosis   . Emphysema   . Gait abnormality 05/14/2013   [name] a patient of Dr. Linda Hedges presented first interpreter thousand 13 with a gait dysfunction, she also had an episodic confusion and Loss device. Exam found a mild nystagmus and she was referred to ophthalmology on 03-2812, brain MRI was normal nystagmus was a secondary diagnosis to vestibulitis, ear nose and throat has followed and had seen the patient. At the time of the nystagmus the patie  . GERD (gastroesophageal reflux disease)   . Hepatic steatosis   . Hyperlipidemia    on atorvastatin   . Hypertension    on losartan   . Iron deficiency anemia, unspecified   . Kidney cysts 11/16/12   small cyst on left  . Nystagmus, end-position   . Osteopenia   . Renal cyst   . Stroke ([place]) 01/2017   mild     Past Surgical History:  Procedure Laterality Date  . ANAL RECTAL MANOMETRY N/A 12/20/2017   Procedure: ANO RECTAL MANOMETRY;  Surgeon: Mauri Pole, MD;  Location: [facility];  Service: Endoscopy;  Laterality: N/A;  . BREAST BIOPSY Bilateral 1970's  . CATARACT EXTRACTION Bilateral LT:02/14/13,RT:02/21/13  . CERVICAL SPINE SURGERY  2010   . COLONOSCOPY  03-12-14   per Dr. Olevia Perches, diverticulosis only, repeat 5 yrs   . EYE SURGERY Bilateral 01/2013&02/2013   left then right  . HERNIA REPAIR  Oct '14-left, remote-right   years ago right; left done '14  . SKIN CANCER EXCISION  05/13/13   FACE, basal cell  . TONSILLECTOMY      There were no vitals filed for this visit.   Subjective Assessment - 01/04/21 1541    Subjective "She'll forget if I don't give her the medicines".    Currently in Pain? No/denies              SLP Evaluation [facility] - 01/04/21 1541      SLP Visit Information   SLP Received On 01/04/21    Onset Date 11/25/2020    Medical Diagnosis Encephalopathy      Subjective   Patient/Family Stated Goal "I want to work on my thinking skills." Medication, memory.      General Information   HPI 80 yo female admitted 11/25/20 after becoming unresponsive. [name], unable to use RUE. Intubated in ED. 2 minutes of CPR. MRI Multiple foci of susceptibility artifact in the bilateral cerebral hemispheres more pronounced in occipital region. Remote lacunar infarct in the  right basal ganglia region, PMH: cervical fusion 2010, CVA (2018), HTN, HLD, gait abnormality, anemia, emphysema, asthma, GERD, diverticulosis, renal cyst, osteopenia. ETT 1/5-8/22. Pt admitted to [facility] 12/10/20.      Balance Screen   Has the patient fallen in the past 6 months No    Has the patient had a decrease in activity level because of a fear of falling?  No    Is the patient reluctant to leave their home because of a fear of falling?  No      Prior Functional Status   Cognitive/Linguistic Baseline Within functional limits    Type of Home House     Lives With Spouse      Cognition   Overall Cognitive Status Within Functional Limits for tasks assessed    Attention Sustained    Memory Impaired    Memory Impairment Decreased short term memory    Awareness Impaired    Executive Function Reasoning;Sequencing;Organizing      Oral Motor/Sensory  Function   Overall Oral Motor/Sensory Function Appears within functional limits for tasks assessed      Motor Speech   Overall Motor Speech Impaired    Respiration Within functional limits    Phonation Normal    Resonance Within functional limits    Articulation Impaired    Level of Impairment Conversation    Intelligibility Intelligibility reduced    Word 75-100% accurate    Phrase 75-100% accurate    Sentence 75-100% accurate    Conversation 75-100% accurate    Motor Planning Witnin functional limits    Motor Speech Errors Not applicable    Effective Techniques Over-articulate;Increased vocal intensity      Standardized Assessments   Standardized Assessments  Other Assessment   SLUMS     Assessment   SLP Visit Diagnosis Dysarthria and anarthria (R47.1);Cognitive communication deficit (R41.841);Dysphagia, oropharyngeal phase (R13.12)             SLU Mental Status (SLUMS Examination)  Orientation: 3/3 Delayed Recall w/ Interference: 1/5 Numeric Calculation and Registration: 1/3 Immediate Recall w/ Interference (Generative naming): 2/3 Registration and Digit Span: 2/2 Visual Spatial/Exec Functioning: 2/6 Executive Functioning/Extrapolation:  6/8 Total: 17/30              SLP Education - 01/04/21 1633    Education Details Provided edu on cognitive-communication deficit, dysarthria, and dysphagia.    Person(s) Educated Patient;Spouse    Methods Explanation;Demonstration    Comprehension Verbalized understanding;Verbal cues required;Tactile cues required;Need further instruction            SLP Short Term Goals - 01/04/21 1644      SLP SHORT TERM GOAL #1   Title Patient will tell SLP 3 areas of deficit/challenge for her given 2 verbal cues.    Time 6    Period Weeks    Status New      SLP SHORT TERM GOAL #2   Title Patient will recall personally-relevant information post >5 minute delay.    Baseline <5 min    Time 6    Period Weeks    Status New       SLP SHORT TERM GOAL #3   Title The patient will complete swallowing maneuvers (supraglottic swallow, Mendelson maneuver, effortful swallow, etc.) to improve oral motor weakness, tongue base retraction, hyolaryngeal excursion, airway protection, and clearance of the bolus through the pharynx with moderate verbal, visual and tactile cues.    Time 6    Period Weeks    Status New  SLP Long Term Goals - 01/04/21 1642      SLP LONG TERM GOAL #1   Title Client will utilize compensatory strategies with optimum safety and efficiency of swallowing function of P.O. intake without overt signs and symptoms of aspiration for the highest appropriate diet level.    Baseline honey thick/dysphagia 2/minced and moist    Time 12    Period Weeks    Status New      SLP LONG TERM GOAL #2   Title Patient will demonstrate use of memory strategies to schedule activities, recall weekly events and items to maintain safety to participate socially in functional living environment    Time 12    Period Weeks    Status New      SLP LONG TERM GOAL #3   Title Patient will demonstrate use of information processing and self monitoring during daily living activities to improve safety and awareness in functional living environment    Time 12    Period Weeks    Status New      SLP LONG TERM GOAL #4   Title Patient will develop functional attention skills to effectively attend to and communicate in tasks of daily living in their functional living environment.    Time 12    Period Weeks    Status New            Plan - 01/04/21 1635    Clinical Impression Statement Pt is a 81 yo female with hx of encephalopathy 2/2 to seizures. Hx of intubation and ventilator use. Dysphagia. Dysarthria. Cognitive deficit. Pt was given the SLUMS which indicated a deficit in attention, short term memory, problem solving, and executive functioning skills. Pt husband is responsible for all ADLs due to significant memory  deficits. Pt reports she would like to be more self-sufficient with medications and her thinking skills. Previous documentation and MBS rec dysphagia 2 diet and honey thick liquids. SLP to address "minced and moist (IDDSI)" terminology with patient and spouse. Oropharyngeal dysphagia to be addressed in treatment. Dysarthria noted in conversation with impaired articulation. Gurgly vocal quality noted. "Silent aspiration" addressed with patient and spouse. SLP rec skilled speech services to address cognitive-linguistic deficit, dysphagia, and dysarthria treatments to increase quality of life and participation.    Speech Therapy Frequency 2x / week    Duration 12 weeks    Treatment/Interventions Aspiration precaution training;Trials of upgraded texture/liquids;Compensatory strategies;Pharyngeal strengthening exercises;Oral motor exercises;Cueing hierarchy;Functional tasks;Patient/family education;Diet toleration management by SLP;Environmental controls;Cognitive reorganization;Compensatory techniques;Internal/external aids;SLP instruction and feedback    Potential to Achieve Goals Fair    Potential Considerations Ability to learn/carryover information    SLP Home Exercise Plan Continue w/ dysphagia exercises until next session.    Consulted and Agree with Plan of Care Patient;Family member/caregiver    Family Member Consulted [name] (spouse)           Patient will benefit from skilled therapeutic intervention in order to improve the following deficits and impairments:   Dysphagia, oropharyngeal phase  Cognitive communication deficit  Dysarthria and anarthria    Problem List Patient Active Problem List   Diagnosis Date Noted  . PAF (paroxysmal atrial fibrillation) ([place]) 12/30/2020  . Heme + stool 12/27/2020  . Hyponatremia 12/27/2020  . Anemia 12/27/2020  . Protein-calorie malnutrition, severe 12/16/2020  . Encephalopathy 12/10/2020  . Grand mal seizure ([place]) 12/02/2020  . COPD (chronic  obstructive pulmonary disease) ([place]) 12/02/2020  . Malnutrition of moderate degree 11/28/2020  . Acute encephalopathy   . Acute  respiratory failure with hypoxia ([place])   . Aspiration into airway   . Cardiac arrest ([place])   . Groin mass 08/20/2020  . Falls infrequently 03/27/2019  . Sensory neuropathy 02/01/2019  . 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 Cerebellar ataxia ([place]) 08/23/2018  . Sensory polyneuropathy 08/23/2018  . Incontinence of feces   . Visual disturbances 02/06/2017  . Essential hypertension 11/07/2016  . Hyperlipidemia 04/24/2015  . Dysuria 02/11/2015  . Acute cystitis without hematuria 02/11/2015  . Chest pain 02/06/2015  . Left inguinal hernia 08/02/2013  . Gait abnormality 05/14/2013  . Gait disorder   . Nystagmus, end-position   . Routine health maintenance 12/13/2011  . LEG CRAMPS, NOCTURNAL 12/07/2010  . CALLUS, TOE 03/19/2010  . hip pain 03/19/2010  . ATAXIA 11/17/2008  . CERVICAL RADICULOPATHY, RIGHT 10/21/2008  . DIVERTICULOSIS OF COLON 08/14/2008  . RENAL CYST 08/14/2008  . ANEMIA-IRON DEFICIENCY 06/16/2007  . Asthma 06/16/2007  . GERD 06/16/2007  . OSTEOPENIA 06/16/2007    Verdene Lennert, MS [place], CBIS 01/04/2021, 4:51 PM  [place]. [place], [place], [address] Phone: [phone]   Fax:  [phone]  Name: UCHECHI DENISON MRN: [id number] Date of Birth: 05-12-1941

## 2021-01-05 ENCOUNTER — Encounter: Payer: Medicare Other | Attending: Registered Nurse | Admitting: Registered Nurse

## 2021-01-05 ENCOUNTER — Other Ambulatory Visit: Payer: Self-pay

## 2021-01-05 ENCOUNTER — Encounter: Payer: Medicare Other | Admitting: Occupational Therapy

## 2021-01-05 ENCOUNTER — Encounter: Payer: Self-pay | Admitting: Registered Nurse

## 2021-01-05 VITALS — BP 155/80 | HR 74 | Temp 98.0°F | Ht 61.0 in | Wt 89.2 lb

## 2021-01-05 DIAGNOSIS — I10 Essential (primary) hypertension: Secondary | ICD-10-CM | POA: Insufficient documentation

## 2021-01-05 DIAGNOSIS — G9340 Encephalopathy, unspecified: Secondary | ICD-10-CM | POA: Diagnosis not present

## 2021-01-05 DIAGNOSIS — R195 Other fecal abnormalities: Secondary | ICD-10-CM | POA: Insufficient documentation

## 2021-01-05 DIAGNOSIS — R569 Unspecified convulsions: Secondary | ICD-10-CM | POA: Diagnosis not present

## 2021-01-05 DIAGNOSIS — I480 Paroxysmal atrial fibrillation: Secondary | ICD-10-CM | POA: Diagnosis not present

## 2021-01-05 NOTE — Therapy (Signed)
[place]. [place], [place], [address] Phone: [phone]   Fax:  [phone]  Occupational Therapy Evaluation  Patient Details  Name: Kristine Francis MRN: [id number] Date of Birth: [birth year] Referring Provider (OT): Lauraine Rinne, PA-C   Encounter Date: 01/04/2021   OT End of Session - 01/05/21 2124    Visit Number 1    Number of Visits 13    Date for OT Re-Evaluation --    Authorization Type Medicare    Progress Note Due on Visit 10    OT Start Time 1446    OT Stop Time 1532    OT Time Calculation (min) 46 min    Activity Tolerance Patient tolerated treatment well    Behavior During Therapy [facility] for tasks assessed/performed           Past Medical History:  Diagnosis Date  . Allergy   . Asthma    sees Dr. Tiajuana Amass - as child per pt  . At high risk for falls    unstable gait   . Ataxia   . Cataract    removed bilaterally   . Diverticulosis   . Emphysema   . Gait abnormality 05/14/2013   Ms.Fenley a patient of Dr. Linda Hedges presented first interpreter thousand 13 with a gait dysfunction, she also had an episodic confusion and Loss device. Exam found a mild nystagmus and she was referred to ophthalmology on 03-2812, brain MRI was normal nystagmus was a secondary diagnosis to vestibulitis, ear nose and throat has followed and had seen the patient. At the time of the nystagmus the patie  . GERD (gastroesophageal reflux disease)   . Hepatic steatosis   . Hyperlipidemia    on atorvastatin   . Hypertension    on losartan   . Iron deficiency anemia, unspecified   . Kidney cysts 11/16/12   small cyst on left  . Nystagmus, end-position   . Osteopenia   . Renal cyst   . Stroke ([place]) 01/2017   mild     Past Surgical History:  Procedure Laterality Date  . ANAL RECTAL MANOMETRY N/A 12/20/2017   Procedure: ANO RECTAL MANOMETRY;  Surgeon: Mauri Pole, MD;  Location: [facility];  Service:  Endoscopy;  Laterality: N/A;  . BREAST BIOPSY Bilateral 1970's  . CATARACT EXTRACTION Bilateral LT:02/14/13,RT:02/21/13  . CERVICAL SPINE SURGERY  2010  . COLONOSCOPY  03-12-14   per Dr. Olevia Perches, diverticulosis only, repeat 5 yrs   . EYE SURGERY Bilateral 01/2013&02/2013   left then right  . HERNIA REPAIR  Oct '14-left, remote-right   years ago right; left done '14  . SKIN CANCER EXCISION  05/13/13   FACE, basal cell  . TONSILLECTOMY      There were no vitals filed for this visit.   Subjective Assessment - 01/04/21 1456    Subjective  Pt reports that she was very independent prior recent hospitalization. Per [facility] ED physician note, pt was admitted to the hospital 11/25/20 presenting with R-sided weakness, gaze deviation, and aphasia. While in the hospital, she had a witnessed grand mal seizure which resulted in encephalopathy. Pt and her husband report that she was in the hospital for about 1 month before being d/c home.    Patient is accompanied by: Family member   [name] (husband)   Pertinent History CVA (March 2018), ataxia, COPD, osteopenia, neuropathy, gait abnormality, asthma, iron deficiency anemia, GERD, and hx nystagmus    Patient Stated Goals Get  back to being independent; play tennis again    Currently in Pain? No/denies             [facility] OT Assessment - 01/04/21 1457      Assessment   Medical Diagnosis Encephalopathy    Referring Provider (OT) Lauraine Rinne, PA-C    Onset Date/Surgical Date 11/25/20    Hand Dominance Right    Next MD Visit 01/05/21    Prior Therapy Yes      Precautions   Precautions Fall    Precaution Comments Seizure      Balance Screen   Has the patient fallen in the past 6 months Yes      Home  Environment   Family/patient expects to be discharged to: Private residence    Type of [place] One level    Bathroom Shower/Tub Tub/Shower unit    [place] - 2 wheels;Shower seat;Grab bars - toilet;Grab bars -  tub/shower;Wheelchair - manual   Has BSC but does not use it; Uses rollator for community mobility   Lives With Spouse   3 granddaughters (7, 31, 3) come every Wednesday     Prior Function   Level of Independence Independent    Vocation Retired   [employer] newspaper   Leisure Play tennis 1x/week, going to church      ADL   Eating/Feeding Supervision/safety   Currently on thickened liquids   Grooming Set up    Upper Body Bathing Supervision/safety    Lower Body Bathing Supervision/safety    Upper Body Dressing Supervision/safety    Lower Body Dressing Supervision/safety    Toilet Transfer --   Pt reports going to the bathroom w/out assist   Tub/Shower Transfer Min guard      IADL   Prior Level of Function Light Housekeeping Mod I    Light Housekeeping Needs help with all home maintenance tasks   Pt reports wanting to return to vacuuming   Prior Level of Function Meal Prep Mod I    Meal Prep Needs to have meals prepared and served    Prior Level of Function Community Mobility Has not driven recently for an extended period of time    Education officer, environmental on family or friends for transportation    Prior Level of Function Medication Managment Independent    Medication Management Is not capable of dispensing or managing own medication   Husband manages medications     Written Expression   Dominant Hand Right    Handwriting --   Pt reports over the past couple of months, writing was decreasingly legible and taking extra time     Vision - History   Baseline Vision Wears glasses only for reading    Visual History Cataracts   Bilateral corrective surgery     Vision Assessment   Comment To be assessed in functional context      Cognition   Overall Cognitive Status Cognition to be further assessed in functional context PRN    Awareness Impaired    Cognition Comments Deficits observed with sustained attention and working Hospital doctor Appears  Intact    Additional Comments Pt reports no deficits with sensation      Coordination   Gross Motor Movements are Fluid and Coordinated No    Fine Motor Movements are Fluid and Coordinated No    Coordination and Movement Description Ataxia    Finger  Nose Finger Test [facility]    9 Hole Peg Test Right;Left   Ataxia and decreased sustained attention demo'd during activity   Right 9 Hole Peg Test 3 min, 52 sec    Left 9 Hole Peg Test 1, min, 58 sec      AROM   Overall AROM  Within functional limits for tasks performed    Overall AROM Comments BUE WFL      Hand Function   Comment Pt reports decreased strength; will continue to assess in a functional context            OT Education - 01/05/21 2121    Education Details Education provided on the role and purpose of OT, as well as potential interventions and goal areas.    Person(s) Educated Patient;Spouse    Methods Explanation    Comprehension Verbalized understanding            OT Short Term Goals - 01/05/21 2215      OT SHORT TERM GOAL #1   Title Pt will be able to complete initial HEP with Min A from caregiver    Baseline No current HEP    Time 3    Period Weeks    Status New    Target Date 01/26/21      OT SHORT TERM GOAL #2   Title Pt will be able to complete asymmetrical bilateral coordination activities independently in at least 3/4 trials to improve functional UE use during ADLs.    Baseline Decreased BUE GM coordination    Time 3    Period Weeks    Status New             OT Long Term Goals - 01/05/21 2226      OT LONG TERM GOAL #1   Title Pt will complete HEP designed for BUE strength and coordination with Mod I and report carryover to home    Baseline No current HEP    Time 6    Period Weeks    Status New    Target Date 02/19/21      OT LONG TERM GOAL #2   Title Pt will demonstrate preparing a simple meal safely with Min A    Baseline Currently unable to prepare meals safely    Time 6    Period Weeks     Status New      OT LONG TERM GOAL #3   Title Pt will be able to safely complete UB/LB dressing with Mod I at least 75% of the time    Baseline Currently SPV/SBA for dressing    Time 6    Period Weeks    Status New      OT LONG TERM GOAL #4   Title Pt will be able to manipulate clothing fasteners without assistance in at least 4/5 trials    Baseline Decreased [place] as evidenced by time on 9-Hole Peg Test    Time 6    Period Weeks    Status New      OT LONG TERM GOAL #5   Title Pt will verbalize understanding of at least 3 compensatory/safety strategies to improve safety with ADLs and functional recall    Baseline Decreased safety awareness and recall; no current compensatory strategies for ADLs    Time 6    Period Weeks    Status New            Plan - 01/05/21 2203    Clinical Impression Statement Pt  is a 80 y.o. female who presents to OPOT due to encephalopathy and ataxia. Significant PMHx including grand mal seizure witnessed during most recent hospital stay (11/25/20), CVA (2018), COPD, osteopenia, neuropathy, gait abnormality, asthma, iron deficiency anemia, GERD, and hx of nystagmus. Pt currently lives with her husband in their private residence and reports primarily using furniture and steady surfaces for functional in-home mobility. Pt will benefit from skilled occupational therapy services to address strength and coordination, [facility] and dexterity, safety awareness, adaptive/compensatory strategies, and functional BUE use to improve safety and participation in ADLs and IADLs.    OT Occupational Profile and History Detailed Assessment- Review of Records and additional review of physical, cognitive, psychosocial history related to current functional performance    Occupational performance deficits (Please refer to evaluation for details): ADL's;IADL's;Leisure    Body Structure / Function / Physical Skills ADL;Decreased knowledge of precautions;ROM;UE functional use;FMC;Dexterity;Body  mechanics;Decreased knowledge of use of DME;Balance;Vision;GMC;Pain;Strength;IADL;Coordination    Cognitive Skills Attention;Problem Solve;Safety Awareness    Rehab Potential Good    Clinical Decision Making Several treatment options, min-mod task modification necessary    Comorbidities Affecting Occupational Performance: Presence of comorbidities impacting occupational performance    Modification or Assistance to Complete Evaluation  Min-Moderate modification of tasks or assist with assess necessary to complete eval    OT Frequency 2x / week    OT Duration 6 weeks    OT Treatment/Interventions Self-care/ADL training;Therapeutic exercise;Visual/perceptual remediation/compensation;Aquatic Therapy;Moist Heat;Neuromuscular education;Patient/family education;Energy conservation;Therapeutic activities;Fluidtherapy;Cryotherapy;DME and/or AE instruction;Manual Therapy;Passive range of motion;Cognitive remediation/compensation    Plan Review goals; Assess strength, vision, and GMC in functional context    Consulted and Agree with Plan of Care Patient;Family member/caregiver    Family Member Consulted Husband (Richard)           Patient will benefit from skilled therapeutic intervention in order to improve the following deficits and impairments:   Body Structure / Function / Physical Skills: ADL,Decreased knowledge of precautions,ROM,UE functional use,FMC,Dexterity,Body mechanics,Decreased knowledge of use of DME,Balance,Vision,GMC,Pain,Strength,IADL,Coordination Cognitive Skills: Attention,Problem Solve,Safety Awareness     Visit Diagnosis: Ataxia  Muscle weakness (generalized)  Attention and concentration deficit  Other lack of coordination    Problem List Patient Active Problem List   Diagnosis Date Noted  . PAF (paroxysmal atrial fibrillation) ([place]) 12/30/2020  . Heme + stool 12/27/2020  . Hyponatremia 12/27/2020  . Anemia 12/27/2020  . Protein-calorie malnutrition, severe  12/16/2020  . Encephalopathy 12/10/2020  . Grand mal seizure ([place]) 12/02/2020  . COPD (chronic obstructive pulmonary disease) ([place]) 12/02/2020  . Malnutrition of moderate degree 11/28/2020  . Acute encephalopathy   . Acute respiratory failure with hypoxia ([place])   . Aspiration into airway   . Cardiac arrest ([place])   . Groin mass 08/20/2020  . Falls infrequently 03/27/2019  . Sensory neuropathy 02/01/2019  . 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 Cerebellar ataxia ([place]) 08/23/2018  . Sensory polyneuropathy 08/23/2018  . Incontinence of feces   . Visual disturbances 02/06/2017  . Essential hypertension 11/07/2016  . Hyperlipidemia 04/24/2015  . Dysuria 02/11/2015  . Acute cystitis without hematuria 02/11/2015  . Chest pain 02/06/2015  . Left inguinal hernia 08/02/2013  . Gait abnormality 05/14/2013  . Gait disorder   . Nystagmus, end-position   . Routine health maintenance 12/13/2011  . LEG CRAMPS, NOCTURNAL 12/07/2010  . CALLUS, TOE 03/19/2010  . hip pain 03/19/2010  . ATAXIA 11/17/2008  . CERVICAL RADICULOPATHY, RIGHT 10/21/2008  . DIVERTICULOSIS OF COLON 08/14/2008  . RENAL CYST 08/14/2008  . ANEMIA-IRON DEFICIENCY 06/16/2007  .  Asthma 06/16/2007  . GERD 06/16/2007  . OSTEOPENIA 06/16/2007     Kathrine Cords, OTR/L, MSOT 01/05/2021, 10:39 PM  [place]. [place], [place], [address] Phone: [phone]   Fax:  [phone]  Name: Kristine Francis MRN: [id number] Date of Birth: 02/09/1941

## 2021-01-05 NOTE — Progress Notes (Signed)
Subjective:    Patient ID: Kristine Francis, female    DOB: April 22, 1941, 79 y.o.   MRN: [id number]  HPI: Kristine Francis is a 80 y.o. female who is here for Hospital follow up appointment of her acute encephalopathy, new onset of seizures, Essential Hypertension, PAF, Seizures and Heme + stool. She was brought to [facility] ED on 11/25/2020, she was unresponsive earlier of the day on 01/05/ 2022, also had a seizure, and lost pulses, she received CPR for 2 minutes. Neurology was consulted.  CT Head WO Contrast:   IMPRESSION: There is no acute intracranial hemorrhage or evidence of acute infarction. ASPECT score is 10.  CT Angio Neck: CT Head IMPRESSION: 1. No large vessel occlusion or proximal hemodynamically significant stenosis intracranially or in the neck. 2. Partially imaged consolidation in the dependent lungs bilaterally, likely aspiration given debris in the right mainstem bronchus, distal bronchi, and the esophagus.  MR Brain W/WO Contrast:  IMPRESSION: 1. Multiple foci of susceptibility artifact in the bilateral cerebral hemispheres, more pronounced in the left occipital region where there is associated subtle T2 hyperintensity of the subcortical white matter and some of the foci appear to be cortical. Findings may represent chronic microhemorrhages related to hypertension or amyloid angiopathy. 2. Remote lacunar infarct in the right basal ganglia region. 3. Scattered foci of T2 hyperintensity within the white matter of the cerebral hemispheres and pons, nonspecific, most likely related to chronic small vessel ischemia.  On 11/26/2020:   Overnight EEG: with Video  IMPRESSION: This study is suggestive of moderate to severe diffuse encephalopathy, nonspecific etiology.  No seizures or epileptiform discharges were seen throughout the recording.  Kristine Francis  She received IV Keppra and was maintained on Keppra P.O. Nephrology Following.  Ms. Paddock was admitted to  inpatient rehabilitation on 12/10/2020 and discharged home on 12/24/2020. She is going to outpatient Therapy at [facility]. She denies any pain. She rates her pain 0. Also reports she has a good appetite.   Husband in room, all questions answered.     Pain Inventory Average Pain 0 Pain Right Now 0 My pain is constant and only weakness on the right side of the body  LOCATION OF PAIN  No Pain  BOWEL Number of stools per week: 7 Oral laxative use No  Type of laxative None Enema or suppository use No  History of colostomy No  Incontinent No   BLADDER Normal In and out cath, frequency  Able to self cath N/A Bladder incontinence No  Frequent urination No  Leakage with coughing No  Difficulty starting stream No  Incomplete bladder emptying No    Mobility use a walker how many minutes can you walk? 280 steps ability to climb steps?  yes do you drive?  no use a wheelchair  Function retired I need assistance with the following:  dressing, bathing and shopping Do you have any goals in this area?  yes  Neuro/Psych weakness trouble walking  Prior Studies Any changes since last visit?  no New Patient  Physicians involved in your care Any changes since last visit?  no New Patient   Family History  Problem Relation Age of Onset  . Colon cancer Father   . Coronary artery disease Father   . Heart block Father   . Pancreatic cancer Mother   . Throat cancer Mother   . Colon cancer Paternal Grandmother   . Colon cancer Paternal Aunt        x 2  .  Cancer - Lung Paternal Aunt   . Breast cancer Paternal Aunt   . Colon cancer Paternal Aunt   . Ovarian cancer Maternal Aunt   . Heart attack Paternal Grandfather   . Esophageal cancer Other   . Colon polyps Neg Hx   . Rectal cancer Neg Hx   . Stomach cancer Neg Hx    Social History   Socioeconomic History  . Marital status: Married    Spouse name: Not on file  . Number of children: 3  . Years of education: 71  .  Highest education level: Not on file  Occupational History  . 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 Smoking status: Never Smoker  . Smokeless tobacco: Never Used  Substance and Sexual Activity  . Alcohol use: No    Alcohol/week: 0.0 standard drinks  . Drug use: No  . Sexual activity: Yes    Partners: Male  Other Topics Concern  . 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 married - 1965. 3 sons - '66, '69, '70. Sons in good health. Marriage in good health. Enjoys retirement - remains active.         Social Determinants of Health   Financial Resource Strain: Not on file  Food Insecurity: Not on file  Transportation Needs: Not on file  Physical Activity: Not on file  Stress: Not on file  Social Connections: Not on file   Past Surgical History:  Procedure Laterality Date  . ANAL RECTAL MANOMETRY N/A 12/20/2017   Procedure: ANO RECTAL MANOMETRY;  Surgeon: Mauri Pole, MD;  Location: [facility];  Service: Endoscopy;  Laterality: N/A;  . BREAST BIOPSY Bilateral 1970's  . CATARACT EXTRACTION Bilateral LT:02/14/13,RT:02/21/13  . CERVICAL SPINE SURGERY  2010  . COLONOSCOPY  03-12-14   per Dr. Olevia Perches, diverticulosis only, repeat 5 yrs   . EYE SURGERY Bilateral 01/2013&02/2013   left then right  . HERNIA REPAIR  Oct '14-left, remote-right   years ago right; left done '14  . SKIN CANCER EXCISION  05/13/13   FACE, basal cell  . TONSILLECTOMY     Past Medical History:  Diagnosis Date  . Allergy   . Asthma    sees Dr. Tiajuana Amass - as child per pt  . At high risk for falls    unstable gait   . Ataxia   . Cataract    removed bilaterally   . Diverticulosis   . Emphysema   . Gait abnormality 05/14/2013   Ms.Massiah a patient of Dr. Linda Hedges presented first interpreter thousand 13 with a gait dysfunction, she also had an episodic confusion and Loss device. Exam found  a mild nystagmus and she was referred to ophthalmology on 03-2812, brain MRI was normal nystagmus was a secondary diagnosis to vestibulitis, ear nose and throat has followed and had seen the patient. At the time of the nystagmus the patie  . GERD (gastroesophageal reflux disease)   . Hepatic steatosis   . Hyperlipidemia    on atorvastatin   . Hypertension    on losartan   . Iron deficiency anemia, unspecified   . Kidney cysts 11/16/12   small cyst on left  . Nystagmus, end-position   . Osteopenia   . Renal cyst   . Stroke ([place]) 01/2017   mild    There were no vitals taken for this visit.  Opioid Risk Score:   Fall Risk Score:  `1  Depression screen PHQ 2/9  Depression screen [facility] 2/9 09/18/2018 08/25/2017 02/02/2017 02/02/2017 12/11/2015 11/10/2014  Decreased Interest 0 0 0 0 0 0  Down, Depressed, Hopeless 0 0 0 0 0 0  PHQ - 2 Score 0 0 0 0 0 0  Some recent data might be hidden   Review of Systems  HENT: Positive for trouble swallowing.   Musculoskeletal: Positive for gait problem.       Numbness on the right side  All other systems reviewed and are negative.      Objective:   Physical Exam Vitals and nursing note reviewed.  Constitutional:      Appearance: Normal appearance.     Comments: Frail  Cardiovascular:     Rate and Rhythm: Normal rate and regular rhythm.     Pulses: Normal pulses.     Heart sounds: Normal heart sounds.  Pulmonary:     Effort: Pulmonary effort is normal.     Breath sounds: Normal breath sounds.  Musculoskeletal:     Cervical back: Normal range of motion and neck supple.     Comments: Normal Muscle Bulk and Muscle Testing Reveals:  Upper Extremities: Full ROM and Muscle Strength 5/5  Lower Extremities: Full ROM and Muscle Strength 5/5 Arrived in wheelchair   Skin:    General: Skin is warm and dry.  Neurological:     Mental Status: She is alert and oriented to person, place, and time.  Psychiatric:        Mood and Affect: Mood normal.         Behavior: Behavior normal.           Assessment & Plan:  1.Acute encephalopathy: new onset of seizures: Continue Outpatient Therapy at [facility]. Continue current medication regimen. Has a scheduled appointment with Neurology. Continue to monitor.  2, Essential Hypertension: Continue current medication regimen. PCP following.  3. PAF: Continue current medication regimen. PCP Following. Continue to Monitor. 4.Seizures: Continue current medication regimen. Has a scheduled appointment with Neurology. Continue to Monitor.  5.  Heme + stool.Protonix was added for GI Prophylaxis: Mr. Amrhein states Dr Sarajane Jews checked her CBC. PCP Following. Continue to monitor.   F/U with Dr Letta Pate in 4- 6 weeks

## 2021-01-12 ENCOUNTER — Telehealth: Payer: Self-pay | Admitting: Family Medicine

## 2021-01-12 ENCOUNTER — Other Ambulatory Visit: Payer: Self-pay | Admitting: Physical Medicine and Rehabilitation

## 2021-01-12 ENCOUNTER — Other Ambulatory Visit: Payer: Self-pay | Admitting: Family Medicine

## 2021-01-12 NOTE — Telephone Encounter (Signed)
Patient's husband has called stating that she is out of Rehab and needs refills on Metoprolol 25 mg, Atorvastatin 10 mg,  Amlodipine 25 mg, pantoprazole 20mg .    Please review and advise.  Thanks. Dm/cma

## 2021-01-12 NOTE — Telephone Encounter (Signed)
Pt  was released from rehab  Would like a refill on all medication , not sure which one please call husband

## 2021-01-12 NOTE — Telephone Encounter (Signed)
Denied refill request, needs to go to PCP, pharmacy aware.

## 2021-01-12 NOTE — Telephone Encounter (Signed)
Received refill request from pharmacy and forwarded to Dr Sarajane Jews and will be addressed then. Dm/cma

## 2021-01-13 ENCOUNTER — Encounter: Payer: Self-pay | Admitting: Physical Therapy

## 2021-01-13 ENCOUNTER — Ambulatory Visit: Payer: Medicare Other | Admitting: Physical Therapy

## 2021-01-13 ENCOUNTER — Other Ambulatory Visit: Payer: Self-pay

## 2021-01-13 ENCOUNTER — Telehealth: Payer: Self-pay

## 2021-01-13 ENCOUNTER — Encounter: Payer: Self-pay | Admitting: Speech Pathology

## 2021-01-13 ENCOUNTER — Encounter: Payer: Self-pay | Admitting: Occupational Therapy

## 2021-01-13 ENCOUNTER — Ambulatory Visit: Payer: Medicare Other | Admitting: Occupational Therapy

## 2021-01-13 ENCOUNTER — Ambulatory Visit: Payer: Medicare Other | Admitting: Speech Pathology

## 2021-01-13 DIAGNOSIS — R270 Ataxia, unspecified: Secondary | ICD-10-CM

## 2021-01-13 DIAGNOSIS — R41840 Attention and concentration deficit: Secondary | ICD-10-CM

## 2021-01-13 DIAGNOSIS — M6281 Muscle weakness (generalized): Secondary | ICD-10-CM | POA: Diagnosis not present

## 2021-01-13 DIAGNOSIS — R278 Other lack of coordination: Secondary | ICD-10-CM | POA: Diagnosis not present

## 2021-01-13 DIAGNOSIS — R2689 Other abnormalities of gait and mobility: Secondary | ICD-10-CM

## 2021-01-13 DIAGNOSIS — R41841 Cognitive communication deficit: Secondary | ICD-10-CM

## 2021-01-13 DIAGNOSIS — R260 Ataxic gait: Secondary | ICD-10-CM

## 2021-01-13 MED ORDER — MELATONIN 3 MG PO TABS: 3.0000 mg | ORAL_TABLET | Freq: Every day | ORAL | 11 refills | Status: DC

## 2021-01-13 MED ORDER — LEVETIRACETAM 500 MG PO TABS: 500.0000 mg | ORAL_TABLET | Freq: Two times a day (BID) | ORAL | 11 refills | Status: DC

## 2021-01-13 MED ORDER — APIXABAN 5 MG PO TABS: 5.0000 mg | ORAL_TABLET | Freq: Two times a day (BID) | ORAL | 11 refills | Status: DC

## 2021-01-13 NOTE — Therapy (Signed)
[facility]. [place], [place], [address] Phone: [phone]   Fax:  [phone]  Speech Language Pathology Treatment  Patient Details  Name: Kristine Francis MRN: [id number] Date of Birth: December 16, 1940 No data recorded  Encounter Date: 01/13/2021   End of Session - 01/13/21 1609    Visit Number 2    Number of Visits 25    Date for SLP Re-Evaluation 04/03/21    SLP Start Time 1530    SLP Stop Time  1615    SLP Time Calculation (min) 45 min    Activity Tolerance Patient tolerated treatment well           Past Medical History:  Diagnosis Date  . Allergy   . Asthma    sees Dr. Tiajuana Amass - as child per pt  . At high risk for falls    unstable gait   . Ataxia   . Cataract    removed bilaterally   . Diverticulosis   . Emphysema   . Gait abnormality 05/14/2013   Ms.Nappi a patient of Dr. Linda Hedges presented first interpreter thousand 13 with a gait dysfunction, she also had an episodic confusion and Loss device. Exam found a mild nystagmus and she was referred to ophthalmology on 03-2812, brain MRI was normal nystagmus was a secondary diagnosis to vestibulitis, ear nose and throat has followed and had seen the patient. At the time of the nystagmus the patie  . GERD (gastroesophageal reflux disease)   . Hepatic steatosis   . Hyperlipidemia    on atorvastatin   . Hypertension    on losartan   . Iron deficiency anemia, unspecified   . Kidney cysts 11/16/12   small cyst on left  . Nystagmus, end-position   . Osteopenia   . Renal cyst   . Stroke ([place]) 01/2017   mild     Past Surgical History:  Procedure Laterality Date  . ANAL RECTAL MANOMETRY N/A 12/20/2017   Procedure: ANO RECTAL MANOMETRY;  Surgeon: Mauri Pole, MD;  Location: [facility];  Service: Endoscopy;  Laterality: N/A;  . BREAST BIOPSY Bilateral 1970's  . CATARACT EXTRACTION Bilateral LT:02/14/13,RT:02/21/13  . CERVICAL SPINE SURGERY  2010   . COLONOSCOPY  03-12-14   per Dr. Olevia Perches, diverticulosis only, repeat 5 yrs   . EYE SURGERY Bilateral 01/2013&02/2013   left then right  . HERNIA REPAIR  Oct '14-left, remote-right   years ago right; left done '14  . SKIN CANCER EXCISION  05/13/13   FACE, basal cell  . TONSILLECTOMY      There were no vitals filed for this visit.   Subjective Assessment - 01/13/21 1534    Subjective "I wasn't tired until I got here."    Patient is accompained by: Family member    Currently in Pain? No/denies                 ADULT SLP TREATMENT - 01/13/21 1601      General Information   Behavior/Cognition Alert;Cooperative;Pleasant mood;Requires cueing      Treatment Provided   Treatment provided Dysphagia      Cognitive-Linquistic Treatment   Treatment focused on Other (comment)    Skilled Treatment Treatment focused on dysphagia. Provided education on dysphagia and aspiration. Pt and pt spouse reported she does not enjoy thickening. SLP spoke with patient and patient family about possibility of starting FFWP and to bring a toothbrush and toothpaste next session to try to improve quality  of life. Trialed effortful swallows and encouraged patient to complete exercises at home. Spaced retrieval was utilized to encourage pt to recall protcols.      Assessment / Recommendations / Plan   Plan Continue with current plan of care      Progression Toward Goals   Progression toward goals Progressing toward goals              SLP Short Term Goals - 01/13/21 1610      SLP SHORT TERM GOAL #1   Title Patient will tell SLP 3 areas of deficit/challenge for her given 2 verbal cues.    Time 6    Period Weeks    Status On-going      SLP SHORT TERM GOAL #2   Title Patient will recall personally-relevant information post >5 minute delay.    Baseline <5 min    Time 6    Period Weeks    Status On-going      SLP SHORT TERM GOAL #3   Title The patient will complete swallowing maneuvers  (supraglottic swallow, Mendelson maneuver, effortful swallow, etc.) to improve oral motor weakness, tongue base retraction, hyolaryngeal excursion, airway protection, and clearance of the bolus through the pharynx with moderate verbal, visual and tactile cues.    Time 6    Period Weeks    Status On-going            SLP Long Term Goals - 01/13/21 1610      SLP LONG TERM GOAL #1   Title Client will utilize compensatory strategies with optimum safety and efficiency of swallowing function of P.O. intake without overt signs and symptoms of aspiration for the highest appropriate diet level.    Baseline honey thick/dysphagia 2/minced and moist    Time 12    Period Weeks    Status On-going      SLP LONG TERM GOAL #2   Title Patient will demonstrate use of memory strategies to schedule activities, recall weekly events and items to maintain safety to participate socially in functional living environment    Time 12    Period Weeks    Status On-going      SLP LONG TERM GOAL #3   Title Patient will demonstrate use of information processing and self monitoring during daily living activities to improve safety and awareness in functional living environment    Time 12    Period Weeks    Status On-going      SLP LONG TERM GOAL #4   Title Patient will develop functional attention skills to effectively attend to and communicate in tasks of daily living in their functional living environment.    Time 12    Period Weeks    Status On-going            Plan - 01/13/21 1610    Clinical Impression Statement Pt is a 80 yo female with hx of encephalopathy 2/2 to seizures. Hx of intubation and ventilator use. Dysphagia. Dysarthria. Cognitive deficit. Pt was given the SLUMS which indicated a deficit in attention, short term memory, problem solving, and executive functioning skills. Pt husband is responsible for all ADLs due to significant memory deficits. Pt reports she would like to be more  self-sufficient with medications and her thinking skills. Previous documentation and MBS rec dysphagia 2 diet and honey thick liquids. SLP to address "minced and moist (IDDSI)" terminology with patient and spouse. Oropharyngeal dysphagia to be addressed in treatment. Dysarthria noted in conversation with impaired articulation.  [place] vocal quality noted. "Silent aspiration" addressed with patient and spouse. SLP rec skilled speech services to address cognitive-linguistic deficit, dysphagia, and dysarthria treatments to increase quality of life and participation.    Speech Therapy Frequency 2x / week    Duration 12 weeks    Treatment/Interventions Aspiration precaution training;Trials of upgraded texture/liquids;Compensatory strategies;Pharyngeal strengthening exercises;Oral motor exercises;Cueing hierarchy;Functional tasks;Patient/family education;Diet toleration management by SLP;Environmental controls;Cognitive reorganization;Compensatory techniques;Internal/external aids;SLP instruction and feedback    Potential to Achieve Goals Fair    Potential Considerations Ability to learn/carryover information    SLP Home Exercise Plan Continue w/ dysphagia exercises until next session.    Consulted and Agree with Plan of Care Patient;Family member/caregiver    Family Member Consulted [name] (spouse)           Patient will benefit from skilled therapeutic intervention in order to improve the following deficits and impairments:   Cognitive communication deficit    Problem List Patient Active Problem List   Diagnosis Date Noted  . PAF (paroxysmal atrial fibrillation) ([place]) 12/30/2020  . Heme + stool 12/27/2020  . Hyponatremia 12/27/2020  . Anemia 12/27/2020  . Protein-calorie malnutrition, severe 12/16/2020  . Encephalopathy 12/10/2020  . Grand mal seizure ([place]) 12/02/2020  . COPD (chronic obstructive pulmonary disease) ([place]) 12/02/2020  . Malnutrition of moderate degree 11/28/2020  . Acute  encephalopathy   . Acute respiratory failure with hypoxia ([place])   . Aspiration into airway   . Cardiac arrest ([place])   . Groin mass 08/20/2020  . Falls infrequently 03/27/2019  . Sensory neuropathy 02/01/2019  . 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 Cerebellar ataxia ([place]) 08/23/2018  . Sensory polyneuropathy 08/23/2018  . Incontinence of feces   . Visual disturbances 02/06/2017  . Essential hypertension 11/07/2016  . Hyperlipidemia 04/24/2015  . Dysuria 02/11/2015  . Acute cystitis without hematuria 02/11/2015  . Chest pain 02/06/2015  . Left inguinal hernia 08/02/2013  . Gait abnormality 05/14/2013  . Gait disorder   . Nystagmus, end-position   . Routine health maintenance 12/13/2011  . LEG CRAMPS, NOCTURNAL 12/07/2010  . CALLUS, TOE 03/19/2010  . hip pain 03/19/2010  . ATAXIA 11/17/2008  . CERVICAL RADICULOPATHY, RIGHT 10/21/2008  . DIVERTICULOSIS OF COLON 08/14/2008  . RENAL CYST 08/14/2008  . ANEMIA-IRON DEFICIENCY 06/16/2007  . Asthma 06/16/2007  . GERD 06/16/2007  . OSTEOPENIA 06/16/2007    Verdene Lennert MS, [place], [facility]  01/13/2021, 4:12 PM  [facility]. [place], [place], [address] Phone: [phone]   Fax:  [phone]   Name: Kristine Francis MRN: [id number] Date of Birth: 10/11/1941

## 2021-01-13 NOTE — Addendum Note (Signed)
Addended by: [name] A on: 01/13/2021 12:35 PM   Modules accepted: Orders

## 2021-01-13 NOTE — Patient Instructions (Signed)
Complete effortful swallows with meal times.

## 2021-01-13 NOTE — Telephone Encounter (Signed)
Spoke to pt's husband, he also needsrefills on  the following meds that were given to her while in the hospital: Eliquis 5 mg,  Melatonin 3 mg,  Levetiracetam 500 mg   Please review and advise.  Thanks.  Dm/cma

## 2021-01-13 NOTE — Therapy (Signed)
[place] [place], [place], [address] Phone: [phone]   Fax:  [phone]  Physical Therapy Treatment  Patient Details  Name: Kristine Francis MRN: [id number] Date of Birth: 18-Jul-1941 Referring Provider (PT): Cathlyn Parsons, PA-C   Encounter Date: 01/13/2021   PT End of Session - 01/13/21 1506    Visit Number 2    Number of Visits 17    Date for PT Re-Evaluation 03/01/21    Authorization Type Medicare Primary; [employer] Secondary (no PTA); 10th visit PN    PT Start Time 1400    PT Stop Time 1445    PT Time Calculation (min) 45 min    Equipment Utilized During Treatment Gait belt    Activity Tolerance Patient tolerated treatment well    Behavior During Therapy [facility] for tasks assessed/performed           Past Medical History:  Diagnosis Date  . Allergy   . Asthma    sees Dr. Tiajuana Amass - as child per pt  . At high risk for falls    unstable gait   . Ataxia   . Cataract    removed bilaterally   . Diverticulosis   . Emphysema   . Gait abnormality 05/14/2013   Ms.Rabadi a patient of Dr. Linda Hedges presented first interpreter thousand 13 with a gait dysfunction, she also had an episodic confusion and Loss device. Exam found a mild nystagmus and she was referred to ophthalmology on 03-2812, brain MRI was normal nystagmus was a secondary diagnosis to vestibulitis, ear nose and throat has followed and had seen the patient. At the time of the nystagmus the patie  . GERD (gastroesophageal reflux disease)   . Hepatic steatosis   . Hyperlipidemia    on atorvastatin   . Hypertension    on losartan   . Iron deficiency anemia, unspecified   . Kidney cysts 11/16/12   small cyst on left  . Nystagmus, end-position   . Osteopenia   . Renal cyst   . Stroke ([place]) 01/2017   mild     Past Surgical History:  Procedure Laterality Date  . ANAL RECTAL MANOMETRY N/A 12/20/2017   Procedure: ANO RECTAL MANOMETRY;   Surgeon: Mauri Pole, MD;  Location: [facility];  Service: Endoscopy;  Laterality: N/A;  . BREAST BIOPSY Bilateral 1970's  . CATARACT EXTRACTION Bilateral LT:02/14/13,RT:02/21/13  . CERVICAL SPINE SURGERY  2010  . COLONOSCOPY  03-12-14   per Dr. Olevia Perches, diverticulosis only, repeat 5 yrs   . EYE SURGERY Bilateral 01/2013&02/2013   left then right  . HERNIA REPAIR  Oct '14-left, remote-right   years ago right; left done '14  . SKIN CANCER EXCISION  05/13/13   FACE, basal cell  . TONSILLECTOMY      There were no vitals filed for this visit.   Subjective Assessment - 01/13/21 1418    Subjective Pt husband reports no new changes this rx. States pt does not like feel of TB; difficult to maintain attention for ex's.    Currently in Pain? No/denies    Pain Score 0-No pain                             [facility] Adult PT Treatment/Exercise - 01/13/21 0001      Ambulation/Gait   Gait Comments gait with RW; CGA-minA. Widened BOS, mild ataxia, RW helps to stabilize. Cues for sequencing esp with  turning to sit      High Level Balance   High Level Balance Activities Side stepping;Backward walking;Turns;Marching forwards    High Level Balance Comments occasional use of HR; CGA for all high level balance activities      Exercises   Exercises Other Exercises;Knee/Hip      Knee/Hip Exercises: Aerobic   Nustep L5 x 6 min      Knee/Hip Exercises: Seated   Long Arc Quad Both;2 sets;10 reps    Long Arc Quad Weight 2 lbs.    Clamshell with TheraBand Yellow   2x10   Marching Both;2 sets;10 reps    Marching Weights 2 lbs.    Sit to Sand 20 reps;with UE support;without UE support   3x10 2 sets with UE support, 1 set w/o UE support CGA for all                   PT Short Term Goals - 01/13/21 1511      PT SHORT TERM GOAL #1   Title Pt and caregiver/spouse independent with initiating initial HEP    Baseline spouse reports difficult to get pt to maintain attention for  HEP; educated on providing visual cuing    Time 2    Period Weeks    Status On-going    Target Date 01/18/21      PT SHORT TERM GOAL #2   Title Pt will improve five time sit to stand from mat, no UE support to </= 15 seconds but with improved anterior weight shift and decreased posterior LOB    Baseline able to perform with no UE support but does demo lack of anterior weight shift and occasional posterior LOB    Time 4    Period Weeks    Status On-going    Target Date 02/01/21      PT SHORT TERM GOAL #3   Title Pt will demo improved static standing balance to at least 1 minute with close supervision and minimal sway, without LOB    Baseline ~20 sec this rx    Time 4    Period Weeks    Status On-going    Target Date 02/01/21      PT SHORT TERM GOAL #4   Title Pt and caregiver will demo good understanding of falls prevention and safe functional mobility within the home to prevent falls    Time 2    Period Weeks    Status On-going    Target Date 01/18/21             PT Long Term Goals - 01/04/21 1640      PT LONG TERM GOAL #1   Title Pt will demonstrate independence with final HEP    Time 8    Period Weeks    Status New    Target Date 03/01/21      PT LONG TERM GOAL #2   Title Pt will improve five time sit to stand without use of UE to </= 13 seconds and no posterior LOB    Time 8    Period Weeks    Status New    Target Date 03/01/21      PT LONG TERM GOAL #3   Title Pt will demo BLE strength at least 4+/5    Time 8    Period Weeks    Status New    Target Date 03/01/21      PT LONG TERM GOAL #4   Title Pt will  improve BERG by at least 10  points to indicate decreased falls risk    Baseline 24/56    Time 8    Period Weeks    Status New    Target Date 03/01/21      PT LONG TERM GOAL #5   Title Pt will ambulate and complete direction changes with least restrictive assistive device safely without LOB to facilitate improved safety with community negotiation     Time 8    Period Weeks    Status New    Target Date 03/01/21                 Plan - 01/13/21 1507    Clinical Impression Statement Pt tolerated progression to TE well. Requires frequent tactile and verbal cues for ex's. Husband notes difficult to get pt to do HEP; pt able to do all ex's in clinic but needs demo and visual cues to complete and maintain attention. Some fatigue of postural muscles with seated ex's and cues for upright posture. Ambulation around clinic x75 ft x3 with RW and CGA; cues to keep RW close and for attention to LLE. Widened BOS and occasional kicks RW with LLE. Pt requires significant cues for LE/AD sequencing with turning to sit. Balance/strength ex's in II bars with CGA; able to complete some without UE assist. Does demo ataxia and occasional LOB so keep close supervision-CGA. More difficulty with sidestepping to R side. Continue to progress functional strengthening and balance ex's.    PT Treatment/Interventions ADLs/Self Care Home Management;Aquatic Therapy;DME Instruction;Gait training;Stair training;Functional mobility training;Therapeutic activities;Therapeutic exercise;Balance training;Neuromuscular re-education;Patient/family education;Vestibular;Visual/perceptual remediation/compensation;Manual techniques;Taping    PT Next Visit Plan Assess HEP. Progressive TE and balance/neurore-ed exercises as tolerated. Frequent cues for safety. Standing balance/vestibular/multi-sensory balance activities, postural exercises, dynamic balance/ balance recovery.    Consulted and Agree with Plan of Care Patient           Patient will benefit from skilled therapeutic intervention in order to improve the following deficits and impairments:  Abnormal gait,Decreased balance,Decreased coordination,Decreased strength,Difficulty walking,Impaired tone,Impaired UE functional use,Impaired vision/preception,Postural dysfunction,Decreased mobility,Decreased safety awareness,Decreased  endurance  Visit Diagnosis: Muscle weakness (generalized)  Other abnormalities of gait and mobility  Other lack of coordination  Ataxic gait     Problem List Patient Active Problem List   Diagnosis Date Noted  . PAF (paroxysmal atrial fibrillation) ([place]) 12/30/2020  . Heme + stool 12/27/2020  . Hyponatremia 12/27/2020  . Anemia 12/27/2020  . Protein-calorie malnutrition, severe 12/16/2020  . Encephalopathy 12/10/2020  . Grand mal seizure ([place]) 12/02/2020  . COPD (chronic obstructive pulmonary disease) ([place]) 12/02/2020  . Malnutrition of moderate degree 11/28/2020  . Acute encephalopathy   . Acute respiratory failure with hypoxia ([place])   . Aspiration into airway   . Cardiac arrest ([place])   . Groin mass 08/20/2020  . Falls infrequently 03/27/2019  . Sensory neuropathy 02/01/2019  . 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 Cerebellar ataxia ([place]) 08/23/2018  . Sensory polyneuropathy 08/23/2018  . Incontinence of feces   . Visual disturbances 02/06/2017  . Essential hypertension 11/07/2016  . Hyperlipidemia 04/24/2015  . Dysuria 02/11/2015  . Acute cystitis without hematuria 02/11/2015  . Chest pain 02/06/2015  . Left inguinal hernia 08/02/2013  . Gait abnormality 05/14/2013  . Gait disorder   . Nystagmus, end-position   . Routine health maintenance 12/13/2011  . LEG CRAMPS, NOCTURNAL 12/07/2010  . CALLUS, TOE 03/19/2010  . hip pain 03/19/2010  . ATAXIA 11/17/2008  . CERVICAL RADICULOPATHY, RIGHT 10/21/2008  .  DIVERTICULOSIS OF COLON 08/14/2008  . RENAL CYST 08/14/2008  . ANEMIA-IRON DEFICIENCY 06/16/2007  . Asthma 06/16/2007  . GERD 06/16/2007  . OSTEOPENIA 06/16/2007   Amador Cunas, PT, DPT Donald Prose Krystalyn Kubota 01/13/2021, 3:14 PM  [address] [place]. [place], [place], [address] Phone: [phone]   Fax:  [phone]  Name: Kristine Francis MRN: [id number] Date of Birth: Feb 05, 1941

## 2021-01-13 NOTE — Telephone Encounter (Signed)
Done

## 2021-01-14 ENCOUNTER — Ambulatory Visit: Payer: Medicare Other | Admitting: Occupational Therapy

## 2021-01-14 ENCOUNTER — Encounter: Payer: Self-pay | Admitting: Speech Pathology

## 2021-01-14 ENCOUNTER — Encounter: Payer: Self-pay | Admitting: Physical Therapy

## 2021-01-14 ENCOUNTER — Ambulatory Visit: Payer: Medicare Other | Admitting: Physical Therapy

## 2021-01-14 ENCOUNTER — Encounter: Payer: Self-pay | Admitting: Occupational Therapy

## 2021-01-14 ENCOUNTER — Ambulatory Visit: Payer: Medicare Other | Admitting: Speech Pathology

## 2021-01-14 VITALS — BP 149/85 | HR 52

## 2021-01-14 DIAGNOSIS — R270 Ataxia, unspecified: Secondary | ICD-10-CM

## 2021-01-14 DIAGNOSIS — R260 Ataxic gait: Secondary | ICD-10-CM

## 2021-01-14 DIAGNOSIS — M6281 Muscle weakness (generalized): Secondary | ICD-10-CM

## 2021-01-14 DIAGNOSIS — R2689 Other abnormalities of gait and mobility: Secondary | ICD-10-CM

## 2021-01-14 DIAGNOSIS — R41840 Attention and concentration deficit: Secondary | ICD-10-CM

## 2021-01-14 DIAGNOSIS — R278 Other lack of coordination: Secondary | ICD-10-CM | POA: Diagnosis not present

## 2021-01-14 DIAGNOSIS — R41841 Cognitive communication deficit: Secondary | ICD-10-CM | POA: Diagnosis not present

## 2021-01-14 NOTE — Therapy (Signed)
[place] [place], [place], [address] Phone: [phone]   Fax:  [phone]  Occupational Therapy Treatment  Patient Details  Name: Kristine Francis MRN: [id number] Date of Birth: [birth year] Referring Provider (OT): Lauraine Rinne, PA-C   Encounter Date: 01/13/2021   OT End of Session - 01/14/21 0808    Visit Number 2    Number of Visits 13    Authorization Type Medicare    Progress Note Due on Visit 10    OT Start Time 2683    OT Stop Time 1531    OT Time Calculation (min) 46 min    Activity Tolerance Patient tolerated treatment well    Behavior During Therapy [facility] for tasks assessed/performed           Past Medical History:  Diagnosis Date  . Allergy   . Asthma    sees Dr. Tiajuana Amass - as child per pt  . At high risk for falls    unstable gait   . Ataxia   . Cataract    removed bilaterally   . Diverticulosis   . Emphysema   . Gait abnormality 05/14/2013   Ms.Kluge a patient of Dr. Linda Hedges presented first interpreter thousand 13 with a gait dysfunction, she also had an episodic confusion and Loss device. Exam found a mild nystagmus and she was referred to ophthalmology on 03-2812, brain MRI was normal nystagmus was a secondary diagnosis to vestibulitis, ear nose and throat has followed and had seen the patient. At the time of the nystagmus the patie  . GERD (gastroesophageal reflux disease)   . Hepatic steatosis   . Hyperlipidemia    on atorvastatin   . Hypertension    on losartan   . Iron deficiency anemia, unspecified   . Kidney cysts 11/16/12   small cyst on left  . Nystagmus, end-position   . Osteopenia   . Renal cyst   . Stroke ([place]) 01/2017   mild     Past Surgical History:  Procedure Laterality Date  . ANAL RECTAL MANOMETRY N/A 12/20/2017   Procedure: ANO RECTAL MANOMETRY;  Surgeon: Mauri Pole, MD;  Location: [facility];  Service: Endoscopy;  Laterality: N/A;  . BREAST  BIOPSY Bilateral 1970's  . CATARACT EXTRACTION Bilateral LT:02/14/13,RT:02/21/13  . CERVICAL SPINE SURGERY  2010  . COLONOSCOPY  03-12-14   per Dr. Olevia Perches, diverticulosis only, repeat 5 yrs   . EYE SURGERY Bilateral 01/2013&02/2013   left then right  . HERNIA REPAIR  Oct '14-left, remote-right   years ago right; left done '14  . SKIN CANCER EXCISION  05/13/13   FACE, basal cell  . TONSILLECTOMY      There were no vitals filed for this visit.   Subjective Assessment - 01/13/21 1448    Subjective  Pt stated "I can still do all of these things" when talking about activities including dressing, vacuuming, and dishwashing. Per husbands report, pt is stubborn at home and does not believe she is at a risk for falls.    Patient is accompanied by: Family member   K[place]ine [place] (husband)   Pertinent History CVA (March 2018), ataxia, COPD, osteopenia, neuropathy, gait abnormality, asthma, iron deficiency anemia, GERD, and hx nystagmus    Patient Stated Goals Get back to being independent; play tennis again    Currently in Pain? No/denies            OT Treatments/Exercises (OP) - 01/14/21 4196  ADLs   ADL Comments OT reviewed all goals with pt    Increased Safety Strategies OT discussed fall prevention and safety with in-home functional mobility as well as safety during ADLs and IADLs   Pt demo's decreased awareness of deficits and fall risk     Visual/Perceptual Exercises   Copy this Image PVC    PVC Copying PVC pipe tree pattern completed with Max A; pt required verbal and visual cues, modeling, extra time, and grading to complete task successfully            OT Education - 01/14/21 0806    Education Details Education provided on both fall risk and fall prevention, as well as safety strategies when participating in ADL/IADLs    Person(s) Educated Patient;Spouse    Methods Explanation    Comprehension Verbalized understanding            OT Short Term Goals - 01/14/21 0818      OT  SHORT TERM GOAL #1   Title Pt will be able to complete initial HEP with Min A from caregiver    Baseline No current HEP    Time 3    Period Weeks    Status On-going    Target Date 01/26/21      OT SHORT TERM GOAL #2   Title Pt will be able to complete asymmetrical bilateral coordination activities independently in at least 3/4 trials to improve functional UE use during ADLs.    Baseline Decreased BUE GM coordination    Time 3    Period Weeks    Status On-going             OT Long Term Goals - 01/14/21 0819      OT LONG TERM GOAL #1   Title Pt will complete HEP designed for BUE strength and coordination with Mod I and report carryover to home    Baseline No current HEP    Time 6    Period Weeks    Status On-going      OT LONG TERM GOAL #2   Title Pt will demonstrate preparing a simple meal safely with Min A    Baseline Currently unable to prepare meals safely    Time 6    Period Weeks    Status On-going      OT LONG TERM GOAL #3   Title Pt will be able to safely complete UB/LB dressing with Mod I at least 75% of the time    Baseline Currently SPV/SBA for dressing    Time 6    Period Weeks    Status On-going      OT LONG TERM GOAL #4   Title Pt will be able to manipulate clothing fasteners without assistance in at least 4/5 trials    Baseline Decreased [place] as evidenced by time on 9-Hole Peg Test    Time 6    Period Weeks    Status On-going      OT LONG TERM GOAL #5   Title Pt will verbalize understanding of at least 3 compensatory/safety strategies to improve safety with ADLs and functional recall    Baseline Decreased safety awareness and recall; no current compensatory strategies for ADLs    Time 6    Period Weeks    Status On-going            Plan - 01/14/21 3295    Clinical Impression Statement Pt demo's decreased awareness of deficits and precautions and is not compliant  with safety strategies at home, per husband's report. During PVC pipe tree  activity, pt demo'd significant difficulty copying pattern, requiring max verbal and visual cues, as well as grading throughout, to complete activity successfully.    OT Occupational Profile and History Detailed Assessment- Review of Records and additional review of physical, cognitive, psychosocial history related to current functional performance    Occupational performance deficits (Please refer to evaluation for details): ADL's;IADL's;Leisure    Body Structure / Function / Physical Skills ADL;Decreased knowledge of precautions;ROM;UE functional use;FMC;Dexterity;Body mechanics;Decreased knowledge of use of DME;Balance;Vision;GMC;Pain;Strength;IADL;Coordination    Cognitive Skills Attention;Problem Solve;Safety Awareness    Rehab Potential Good    Clinical Decision Making Several treatment options, min-mod task modification necessary    Comorbidities Affecting Occupational Performance: Presence of comorbidities impacting occupational performance    Modification or Assistance to Complete Evaluation  Min-Moderate modification of tasks or assist with assess necessary to complete eval    OT Frequency 2x / week    OT Duration 6 weeks    OT Treatment/Interventions Self-care/ADL training;Therapeutic exercise;Visual/perceptual remediation/compensation;Aquatic Therapy;Moist Heat;Neuromuscular education;Patient/family education;Energy conservation;Therapeutic activities;Fluidtherapy;Cryotherapy;DME and/or AE instruction;Manual Therapy;Passive range of motion;Cognitive remediation/compensation    Plan Assess vision, visual perception    Consulted and Agree with Plan of Care Patient;Family member/caregiver    Family Member Consulted Husband (Richard)           Patient will benefit from skilled therapeutic intervention in order to improve the following deficits and impairments:   Body Structure / Function / Physical Skills: ADL,Decreased knowledge of precautions,ROM,UE functional use,FMC,Dexterity,Body  mechanics,Decreased knowledge of use of DME,Balance,Vision,GMC,Pain,Strength,IADL,Coordination Cognitive Skills: Attention,Problem Solve,Safety Awareness     Visit Diagnosis: Ataxia  Muscle weakness (generalized)  Attention and concentration deficit  Other lack of coordination    Problem List Patient Active Problem List   Diagnosis Date Noted  . PAF (paroxysmal atrial fibrillation) ([place]) 12/30/2020  . Heme + stool 12/27/2020  . Hyponatremia 12/27/2020  . Anemia 12/27/2020  . Protein-calorie malnutrition, severe 12/16/2020  . Encephalopathy 12/10/2020  . Grand mal seizure ([place]) 12/02/2020  . COPD (chronic obstructive pulmonary disease) ([place]) 12/02/2020  . Malnutrition of moderate degree 11/28/2020  . Acute encephalopathy   . Acute respiratory failure with hypoxia ([place])   . Aspiration into airway   . Cardiac arrest ([place])   . Groin mass 08/20/2020  . Falls infrequently 03/27/2019  . Sensory neuropathy 02/01/2019  . 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 Cerebellar ataxia ([place]) 08/23/2018  . Sensory polyneuropathy 08/23/2018  . Incontinence of feces   . Visual disturbances 02/06/2017  . Essential hypertension 11/07/2016  . Hyperlipidemia 04/24/2015  . Dysuria 02/11/2015  . Acute cystitis without hematuria 02/11/2015  . Chest pain 02/06/2015  . Left inguinal hernia 08/02/2013  . Gait abnormality 05/14/2013  . Gait disorder   . Nystagmus, end-position   . Routine health maintenance 12/13/2011  . LEG CRAMPS, NOCTURNAL 12/07/2010  . CALLUS, TOE 03/19/2010  . hip pain 03/19/2010  . ATAXIA 11/17/2008  . CERVICAL RADICULOPATHY, RIGHT 10/21/2008  . DIVERTICULOSIS OF COLON 08/14/2008  . RENAL CYST 08/14/2008  . ANEMIA-IRON DEFICIENCY 06/16/2007  . Asthma 06/16/2007  . GERD 06/16/2007  . OSTEOPENIA 06/16/2007     Kathrine Cords, OTR/L, MSOT 01/14/2021, 8:33 AM  [address] [place], [place],  [address] Phone: [phone]   Fax:  [phone]  Name: MAEZIE JUSTIN MRN: [id number] Date of Birth: September 12, 1941

## 2021-01-14 NOTE — Therapy (Signed)
[facility] [place], [place], [address] Phone: [phone]   Fax:  [phone]  Physical Therapy Treatment  Patient Details  Name: Kristine Francis MRN: [id number] Date of Birth: [birth year] Referring Provider (PT): Cathlyn Parsons, PA-C   Encounter Date: 01/14/2021   PT End of Session - 01/14/21 1428    Visit Number 3    Number of Visits 17    Date for PT Re-Evaluation 03/01/21    PT Start Time 3557    PT Stop Time 1400    PT Time Calculation (min) 45 min    Equipment Utilized During Treatment Gait belt    Activity Tolerance Patient limited by fatigue    Behavior During Therapy [facility] for tasks assessed/performed           Past Medical History:  Diagnosis Date  . Allergy   . Asthma    sees Dr. Tiajuana Amass - as child per pt  . At high risk for falls    unstable gait   . Ataxia   . Cataract    removed bilaterally   . Diverticulosis   . Emphysema   . Gait abnormality 05/14/2013   Kristine Francis a patient of Dr. Linda Hedges presented first interpreter thousand 13 with a gait dysfunction, she also had an episodic confusion and Loss device. Exam found a mild nystagmus and she was referred to ophthalmology on 03-2812, brain MRI was normal nystagmus was a secondary diagnosis to vestibulitis, ear nose and throat has followed and had seen the patient. At the time of the nystagmus the patie  . GERD (gastroesophageal reflux disease)   . Hepatic steatosis   . Hyperlipidemia    on atorvastatin   . Hypertension    on losartan   . Iron deficiency anemia, unspecified   . Kidney cysts 11/16/12   small cyst on left  . Nystagmus, end-position   . Osteopenia   . Renal cyst   . Stroke ([place]) 01/2017   mild     Past Surgical History:  Procedure Laterality Date  . ANAL RECTAL MANOMETRY N/A 12/20/2017   Procedure: ANO RECTAL MANOMETRY;  Surgeon: Mauri Pole, MD;  Location: [facility];  Service: Endoscopy;  Laterality:  N/A;  . BREAST BIOPSY Bilateral 1970's  . CATARACT EXTRACTION Bilateral LT:02/14/13,RT:02/21/13  . CERVICAL SPINE SURGERY  2010  . COLONOSCOPY  03-12-14   per Dr. Olevia Perches, diverticulosis only, repeat 5 yrs   . EYE SURGERY Bilateral 01/2013&02/2013   left then right  . HERNIA REPAIR  Oct '14-left, remote-right   years ago right; left done '14  . SKIN CANCER EXCISION  05/13/13   FACE, basal cell  . TONSILLECTOMY      Vitals:   01/14/21 1326  BP: (!) 149/85  Pulse: (!) 52  SpO2: 100%     Subjective Assessment - 01/14/21 1326    Subjective Pt husband reports no new changes this rx.    Currently in Pain? No/denies    Pain Score 0-No pain                             [facility] Adult PT Treatment/Exercise - 01/14/21 0001      Ambulation/Gait   Gait Comments gait with RW; CGA-minA. Widened BOS, mild ataxia, RW helps to stabilize. Cues for sequencing esp with turning to sit      Knee/Hip Exercises: Aerobic   Nustep L5 x 7 min  Knee/Hip Exercises: Seated   Long Arc Quad Both;2 sets;10 reps    Long Arc Quad Weight 2 lbs.    Clamshell with TheraBand Green   1x15   Marching Both;2 sets;10 reps    Marching Weights 2 lbs.    Sit to Sand 20 reps;without UE support   holding ball                   PT Short Term Goals - 01/13/21 1511      PT SHORT TERM GOAL #1   Title Pt and caregiver/spouse independent with initiating initial HEP    Baseline spouse reports difficult to get pt to maintain attention for HEP; educated on providing visual cuing    Time 2    Period Weeks    Status On-going    Target Date 01/18/21      PT SHORT TERM GOAL #2   Title Pt will improve five time sit to stand from mat, no UE support to </= 15 seconds but with improved anterior weight shift and decreased posterior LOB    Baseline able to perform with no UE support but does demo lack of anterior weight shift and occasional posterior LOB    Time 4    Period Weeks    Status On-going     Target Date 02/01/21      PT SHORT TERM GOAL #3   Title Pt will demo improved static standing balance to at least 1 minute with close supervision and minimal sway, without LOB    Baseline ~20 sec this rx    Time 4    Period Weeks    Status On-going    Target Date 02/01/21      PT SHORT TERM GOAL #4   Title Pt and caregiver will demo good understanding of falls prevention and safe functional mobility within the home to prevent falls    Time 2    Period Weeks    Status On-going    Target Date 01/18/21             PT Long Term Goals - 01/04/21 1640      PT LONG TERM GOAL #1   Title Pt will demonstrate independence with final HEP    Time 8    Period Weeks    Status New    Target Date 03/01/21      PT LONG TERM GOAL #2   Title Pt will improve five time sit to stand without use of UE to </= 13 seconds and no posterior LOB    Time 8    Period Weeks    Status New    Target Date 03/01/21      PT LONG TERM GOAL #3   Title Pt will demo BLE strength at least 4+/5    Time 8    Period Weeks    Status New    Target Date 03/01/21      PT LONG TERM GOAL #4   Title Pt will improve BERG by at least 10  points to indicate decreased falls risk    Baseline 24/56    Time 8    Period Weeks    Status New    Target Date 03/01/21      PT LONG TERM GOAL #5   Title Pt will ambulate and complete direction changes with least restrictive assistive device safely without LOB to facilitate improved safety with community negotiation    Time 8    Period Weeks  Status New    Target Date 03/01/21                 Plan - 01/14/21 1428    Clinical Impression Statement Pt demos some carryover from prior session to this for STS with no UE assist; still requires CGA d/t truncal ataxia. Requires very specific cues to sequence turns. Cuing to maintain RW within BOS. Pt experienced coughing fit about 2/3 through session; difficulty taking breaths and took 3-4 minutes to stop coughing. Pt  O2 stayed between 98-100% and HR of 50-52 bpm. Husband states low HR has been typical for pt since hospitalization. BP was slightly elevated; husband plans to take again after returning home. By end of session pt breathing normally, no coughing, no reports of lightheadedness, vitals WNL.    PT Treatment/Interventions ADLs/Self Care Home Management;Aquatic Therapy;DME Instruction;Gait training;Stair training;Functional mobility training;Therapeutic activities;Therapeutic exercise;Balance training;Neuromuscular re-education;Patient/family education;Vestibular;Visual/perceptual remediation/compensation;Manual techniques;Taping    PT Next Visit Plan Assess HEP. Progressive TE and balance/neurore-ed exercises as tolerated. Frequent cues for safety. Standing balance/vestibular/multi-sensory balance activities, postural exercises, dynamic balance/ balance recovery.    Consulted and Agree with Plan of Care Patient           Patient will benefit from skilled therapeutic intervention in order to improve the following deficits and impairments:  Abnormal gait,Decreased balance,Decreased coordination,Decreased strength,Difficulty walking,Impaired tone,Impaired UE functional use,Impaired vision/preception,Postural dysfunction,Decreased mobility,Decreased safety awareness,Decreased endurance  Visit Diagnosis: Muscle weakness (generalized)  Other lack of coordination  Other abnormalities of gait and mobility  Ataxic gait     Problem List Patient Active Problem List   Diagnosis Date Noted  . PAF (paroxysmal atrial fibrillation) ([place]) 12/30/2020  . Heme + stool 12/27/2020  . Hyponatremia 12/27/2020  . Anemia 12/27/2020  . Protein-calorie malnutrition, severe 12/16/2020  . Encephalopathy 12/10/2020  . Grand mal seizure ([place]) 12/02/2020  . COPD (chronic obstructive pulmonary disease) ([place]) 12/02/2020  . Malnutrition of moderate degree 11/28/2020  . Acute encephalopathy   . Acute respiratory failure  with hypoxia ([place])   . Aspiration into airway   . Cardiac arrest ([place])   . Groin mass 08/20/2020  . Falls infrequently 03/27/2019  . Sensory neuropathy 02/01/2019  . 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 Cerebellar ataxia ([place]) 08/23/2018  . Sensory polyneuropathy 08/23/2018  . Incontinence of feces   . Visual disturbances 02/06/2017  . Essential hypertension 11/07/2016  . Hyperlipidemia 04/24/2015  . Dysuria 02/11/2015  . Acute cystitis without hematuria 02/11/2015  . Chest pain 02/06/2015  . Left inguinal hernia 08/02/2013  . Gait abnormality 05/14/2013  . Gait disorder   . Nystagmus, end-position   . Routine health maintenance 12/13/2011  . LEG CRAMPS, NOCTURNAL 12/07/2010  . CALLUS, TOE 03/19/2010  . hip pain 03/19/2010  . ATAXIA 11/17/2008  . CERVICAL RADICULOPATHY, RIGHT 10/21/2008  . DIVERTICULOSIS OF COLON 08/14/2008  . RENAL CYST 08/14/2008  . ANEMIA-IRON DEFICIENCY 06/16/2007  . Asthma 06/16/2007  . GERD 06/16/2007  . OSTEOPENIA 06/16/2007   Amador Cunas, PT, DPT Donald Prose Quincey [place] 01/14/2021, 2:34 PM  [place]. [place], [place], [address] Phone: [phone]   Fax:  [phone]  Name: TYLIAH SCHLERETH MRN: [id number] Date of Birth: June 05, 1941

## 2021-01-14 NOTE — Therapy (Signed)
[facility]. [place], [place], [address] Phone: [phone]   Fax:  [phone]  Speech Language Pathology Treatment  Patient Details  Name: Kristine Francis MRN: [id number] Date of Birth: 10/04/1941 No data recorded  Encounter Date: 01/14/2021   End of Session - 01/14/21 1522    Visit Number 3    Number of Visits 25    Date for SLP Re-Evaluation 04/03/21    SLP Start Time 1445    SLP Stop Time  2671    SLP Time Calculation (min) 45 min    Activity Tolerance Patient tolerated treatment well           Past Medical History:  Diagnosis Date  . Allergy   . Asthma    sees Dr. Tiajuana Amass - as child per pt  . At high risk for falls    unstable gait   . Ataxia   . Cataract    removed bilaterally   . Diverticulosis   . Emphysema   . Gait abnormality 05/14/2013   Ms.Opfer a patient of Dr. Linda Hedges presented first interpreter thousand 13 with a gait dysfunction, she also had an episodic confusion and Loss device. Exam found a mild nystagmus and she was referred to ophthalmology on 03-2812, brain MRI was normal nystagmus was a secondary diagnosis to vestibulitis, ear nose and throat has followed and had seen the patient. At the time of the nystagmus the patie  . GERD (gastroesophageal reflux disease)   . Hepatic steatosis   . Hyperlipidemia    on atorvastatin   . Hypertension    on losartan   . Iron deficiency anemia, unspecified   . Kidney cysts 11/16/12   small cyst on left  . Nystagmus, end-position   . Osteopenia   . Renal cyst   . Stroke ([place]) 01/2017   mild     Past Surgical History:  Procedure Laterality Date  . ANAL RECTAL MANOMETRY N/A 12/20/2017   Procedure: ANO RECTAL MANOMETRY;  Surgeon: Mauri Pole, MD;  Location: [facility];  Service: Endoscopy;  Laterality: N/A;  . BREAST BIOPSY Bilateral 1970's  . CATARACT EXTRACTION Bilateral LT:02/14/13,RT:02/21/13  . CERVICAL SPINE SURGERY  2010   . COLONOSCOPY  03-12-14   per Dr. Olevia Perches, diverticulosis only, repeat 5 yrs   . EYE SURGERY Bilateral 01/2013&02/2013   left then right  . HERNIA REPAIR  Oct '14-left, remote-right   years ago right; left done '14  . SKIN CANCER EXCISION  05/13/13   FACE, basal cell  . TONSILLECTOMY      There were no vitals filed for this visit.          ADULT SLP TREATMENT - 01/14/21 0001      General Information   Behavior/Cognition Alert;Cooperative;Pleasant mood;Requires cueing      Treatment Provided   Treatment provided Dysphagia      Cognitive-Linquistic Treatment   Treatment focused on Cognition    Skilled Treatment Treatment focused on dysphagia and cognition. Trialed with peanut butter. Pt successfuly cleared from her mouth with a honey thick liquid. To trial FFWP next session. Sequenced information with min A. Memory for recall of thickened vs thin liquids used - spaced retrieval.      Assessment / Recommendations / Plan   Plan Continue with current plan of care      Progression Toward Goals   Progression toward goals Progressing toward goals  SLP Short Term Goals - 01/14/21 1525      SLP SHORT TERM GOAL #1   Title Patient will tell SLP 3 areas of deficit/challenge for her given 2 verbal cues.    Time 5    Period Weeks    Status On-going      SLP SHORT TERM GOAL #2   Title Patient will recall personally-relevant information post >5 minute delay.    Baseline <5 min    Time 5    Period Weeks    Status On-going      SLP SHORT TERM GOAL #3   Title The patient will complete swallowing maneuvers (supraglottic swallow, Mendelson maneuver, effortful swallow, etc.) to improve oral motor weakness, tongue base retraction, hyolaryngeal excursion, airway protection, and clearance of the bolus through the pharynx with moderate verbal, visual and tactile cues.    Time 5    Period Weeks    Status On-going            SLP Long Term Goals - 01/14/21 1525       SLP LONG TERM GOAL #1   Title Client will utilize compensatory strategies with optimum safety and efficiency of swallowing function of P.O. intake without overt signs and symptoms of aspiration for the highest appropriate diet level.    Baseline honey thick/dysphagia 2/minced and moist    Time 11    Period Weeks    Status On-going      SLP LONG TERM GOAL #2   Title Patient will demonstrate use of memory strategies to schedule activities, recall weekly events and items to maintain safety to participate socially in functional living environment    Time 11    Period Weeks    Status On-going      SLP LONG TERM GOAL #3   Title Patient will demonstrate use of information processing and self monitoring during daily living activities to improve safety and awareness in functional living environment    Time 11    Period Weeks    Status On-going      SLP LONG TERM GOAL #4   Title Patient will develop functional attention skills to effectively attend to and communicate in tasks of daily living in their functional living environment.    Time 11    Period Weeks    Status On-going            Plan - 01/14/21 1524    Clinical Impression Statement Pt is a 80 yo female with hx of encephalopathy 2/2 to seizures. Hx of intubation and ventilator use. Dysphagia. Dysarthria. Cognitive deficit. Pt was given the SLUMS which indicated a deficit in attention, short term memory, problem solving, and executive functioning skills. Pt husband is responsible for all ADLs due to significant memory deficits. Pt reports she would like to be more self-sufficient with medications and her thinking skills. Previous documentation and MBS rec dysphagia 2 diet and honey thick liquids. SLP to address "minced and moist (IDDSI)" terminology with patient and spouse. Oropharyngeal dysphagia to be addressed in treatment. Dysarthria noted in conversation with impaired articulation. Gurgly vocal quality noted. "Silent aspiration"  addressed with patient and spouse. SLP rec skilled speech services to address cognitive-linguistic deficit, dysphagia, and dysarthria treatments to increase quality of life and participation.    Speech Therapy Frequency 2x / week    Duration 12 weeks    Treatment/Interventions Aspiration precaution training;Trials of upgraded texture/liquids;Compensatory strategies;Pharyngeal strengthening exercises;Oral motor exercises;Cueing hierarchy;Functional tasks;Patient/family education;Diet toleration management by SLP;Environmental controls;Cognitive reorganization;Compensatory techniques;Internal/external  aids;SLP instruction and feedback    Potential to Achieve Goals Fair    Potential Considerations Ability to learn/carryover information    SLP Home Exercise Plan Continue w/ dysphagia exercises until next session.    Consulted and Agree with Plan of Care Patient;Family member/caregiver    Family Member Consulted [name] (spouse)           Patient will benefit from skilled therapeutic intervention in order to improve the following deficits and impairments:   Cognitive communication deficit    Problem List Patient Active Problem List   Diagnosis Date Noted  . PAF (paroxysmal atrial fibrillation) ([place]) 12/30/2020  . Heme + stool 12/27/2020  . Hyponatremia 12/27/2020  . Anemia 12/27/2020  . Protein-calorie malnutrition, severe 12/16/2020  . Encephalopathy 12/10/2020  . Grand mal seizure ([place]) 12/02/2020  . COPD (chronic obstructive pulmonary disease) ([place]) 12/02/2020  . Malnutrition of moderate degree 11/28/2020  . Acute encephalopathy   . Acute respiratory failure with hypoxia ([place])   . Aspiration into airway   . Cardiac arrest ([place])   . Groin mass 08/20/2020  . Falls infrequently 03/27/2019  . Sensory neuropathy 02/01/2019  . 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 Cerebellar ataxia ([place]) 08/23/2018  . Sensory polyneuropathy 08/23/2018  . Incontinence of feces   . Visual disturbances  02/06/2017  . Essential hypertension 11/07/2016  . Hyperlipidemia 04/24/2015  . Dysuria 02/11/2015  . Acute cystitis without hematuria 02/11/2015  . Chest pain 02/06/2015  . Left inguinal hernia 08/02/2013  . Gait abnormality 05/14/2013  . Gait disorder   . Nystagmus, end-position   . Routine health maintenance 12/13/2011  . LEG CRAMPS, NOCTURNAL 12/07/2010  . CALLUS, TOE 03/19/2010  . hip pain 03/19/2010  . ATAXIA 11/17/2008  . CERVICAL RADICULOPATHY, RIGHT 10/21/2008  . DIVERTICULOSIS OF COLON 08/14/2008  . RENAL CYST 08/14/2008  . ANEMIA-IRON DEFICIENCY 06/16/2007  . Asthma 06/16/2007  . GERD 06/16/2007  . OSTEOPENIA 06/16/2007    Verdene Lennert MS, [place], CBIS  01/14/2021, 3:30 PM  [facility]. [place], [place], [address] Phone: [phone]   Fax:  [phone]   Name: Kristine Francis MRN: [id number] Date of Birth: 06-21-1941

## 2021-01-15 NOTE — Therapy (Signed)
[address]. [place], [place], [address] Phone: [phone]   Fax:  [phone]  Occupational Therapy Treatment  Patient Details  Name: Kristine Francis MRN: [id number] Date of Birth: October 11, 1941 Referring Provider (OT): Lauraine Rinne, PA-C   Encounter Date: 01/14/2021   OT End of Session - 01/14/21 1640    Visit Number 3    Number of Visits 13    Authorization Type Medicare    Progress Note Due on Visit 10    OT Start Time 1401    OT Stop Time 1444    OT Time Calculation (min) 43 min    Activity Tolerance Patient tolerated treatment well    Behavior During Therapy [facility] for tasks assessed/performed           Past Medical History:  Diagnosis Date  . Allergy   . Asthma    sees Dr. Tiajuana Amass - as child per pt  . At high risk for falls    unstable gait   . Ataxia   . Cataract    removed bilaterally   . Diverticulosis   . Emphysema   . Gait abnormality 05/14/2013   Ms.Ord a patient of Dr. Linda Hedges presented first interpreter thousand 13 with a gait dysfunction, she also had an episodic confusion and Loss device. Exam found a mild nystagmus and she was referred to ophthalmology on 03-2812, brain MRI was normal nystagmus was a secondary diagnosis to vestibulitis, ear nose and throat has followed and had seen the patient. At the time of the nystagmus the patie  . GERD (gastroesophageal reflux disease)   . Hepatic steatosis   . Hyperlipidemia    on atorvastatin   . Hypertension    on losartan   . Iron deficiency anemia, unspecified   . Kidney cysts 11/16/12   small cyst on left  . Nystagmus, end-position   . Osteopenia   . Renal cyst   . Stroke ([place]) 01/2017   mild     Past Surgical History:  Procedure Laterality Date  . ANAL RECTAL MANOMETRY N/A 12/20/2017   Procedure: ANO RECTAL MANOMETRY;  Surgeon: Mauri Pole, MD;  Location: [facility];  Service: Endoscopy;  Laterality: N/A;  . BREAST  BIOPSY Bilateral 1970's  . CATARACT EXTRACTION Bilateral LT:02/14/13,RT:02/21/13  . CERVICAL SPINE SURGERY  2010  . COLONOSCOPY  03-12-14   per Dr. Olevia Perches, diverticulosis only, repeat 5 yrs   . EYE SURGERY Bilateral 01/2013&02/2013   left then right  . HERNIA REPAIR  Oct '14-left, remote-right   years ago right; left done '14  . SKIN CANCER EXCISION  05/13/13   FACE, basal cell  . TONSILLECTOMY      There were no vitals filed for this visit.   Subjective Assessment - 01/14/21 1623    Subjective  During pegboard activity, pt stated that copying the pattern and getting the pegs to go in the board were challenging. Pt also did not recall recent encephalopathy, but was able to remember when prompted by her husband.    Patient is accompanied by: Family member   Kristine Francis (husband)   Pertinent History CVA (March 2018), ataxia, COPD, osteopenia, neuropathy, gait abnormality, asthma, iron deficiency anemia, GERD, and hx nystagmus    Patient Stated Goals Get back to being independent; play tennis again    Currently in Pain? No/denies            OT Treatments/Exercises (OP) - 01/14/21 2025  ADLs   Increased Safety Strategies OT reviewed fall prevention and safety with in-home functional mobility as well as safety during ADLs and IADLs      Visual/Perceptual Exercises   Copy this Image Pegboard    Pegboard Medium-level pegboard pattern completed with Mod A; pt required verbal and visual cues, visual blocking, modeling, extra time, and grading to complete task successfully   Pt demo'd poor short-term visual memory and spatial relations during activity   Other Exercises OT assessed and provided education on visual tracking, saccades, and diplopia   Nystagmus w/ visual tracking, primarily w/ horizontal; pt reported diplopia at end-range of both L and R horizontal tracking. Difficulty w/ downward tracking, pt exhibited upward head tilt and required verbal cues to track target in inferior visual field            OT Education - 01/14/21 1635    Education Details Education provided on benefits of therapeutic activities completed this session, as well as on observed visual deficits and limitations related to pt's current fall risk status.    Person(s) Educated Patient;Spouse    Methods Explanation    Comprehension Verbalized understanding            OT Short Term Goals - 01/14/21 0818      OT SHORT TERM GOAL #1   Title Pt will be able to complete initial HEP with Min A from caregiver    Baseline No current HEP    Time 3    Period Weeks    Status On-going    Target Date 01/26/21      OT SHORT TERM GOAL #2   Title Pt will be able to complete asymmetrical bilateral coordination activities independently in at least 3/4 trials to improve functional UE use during ADLs.    Baseline Decreased BUE GM coordination    Time 3    Period Weeks    Status On-going            OT Long Term Goals - 01/14/21 0819      OT LONG TERM GOAL #1   Title Pt will complete HEP designed for BUE strength and coordination with Mod I and report carryover to home    Baseline No current HEP    Time 6    Period Weeks    Status On-going      OT LONG TERM GOAL #2   Title Pt will demonstrate preparing a simple meal safely with Min A    Baseline Currently unable to prepare meals safely    Time 6    Period Weeks    Status On-going      OT LONG TERM GOAL #3   Title Pt will be able to safely complete UB/LB dressing with Mod I at least 75% of the time    Baseline Currently SPV/SBA for dressing    Time 6    Period Weeks    Status On-going      OT LONG TERM GOAL #4   Title Pt will be able to manipulate clothing fasteners without assistance in at least 4/5 trials    Baseline Decreased [place] as evidenced by time on 9-Hole Peg Test    Time 6    Period Weeks    Status On-going      OT LONG TERM GOAL #5   Title Pt will verbalize understanding of at least 3 compensatory/safety strategies to improve  safety with ADLs and functional recall    Baseline Decreased safety awareness and  recall; no current compensatory strategies for ADLs    Time 6    Period Weeks    Status On-going            Plan - 01/14/21 1647    Clinical Impression Statement OT graded down visual perceptual activity this session, due to difficulty with PVC pipe tree activity observed previous session. Pt required breakdown of task, verbal and visual cues, grading, and modeling to complete pegboard pattern activity successfully. Pt continues to demo decreased awareness of deficits, limitations, and precations.    OT Occupational Profile and History Detailed Assessment- Review of Records and additional review of physical, cognitive, psychosocial history related to current functional performance    Occupational performance deficits (Please refer to evaluation for details): ADL's;IADL's;Leisure    Body Structure / Function / Physical Skills ADL;Decreased knowledge of precautions;ROM;UE functional use;FMC;Dexterity;Body mechanics;Decreased knowledge of use of DME;Balance;Vision;GMC;Pain;Strength;IADL;Coordination    Cognitive Skills Attention;Problem Solve;Safety Awareness    Rehab Potential Good    Clinical Decision Making Several treatment options, min-mod task modification necessary    Comorbidities Affecting Occupational Performance: Presence of comorbidities impacting occupational performance    Modification or Assistance to Complete Evaluation  Min-Moderate modification of tasks or assist with assess necessary to complete eval    OT Frequency 2x / week    OT Duration 6 weeks    OT Treatment/Interventions Self-care/ADL training;Therapeutic exercise;Visual/perceptual remediation/compensation;Aquatic Therapy;Moist Heat;Neuromuscular education;Patient/family education;Energy conservation;Therapeutic activities;Fluidtherapy;Cryotherapy;DME and/or AE instruction;Manual Therapy;Passive range of motion;Cognitive  remediation/compensation    Plan Address IADLs    Consulted and Agree with Plan of Care Patient;Family member/caregiver    Family Member Consulted Husband (Richard)           Patient will benefit from skilled therapeutic intervention in order to improve the following deficits and impairments:   Body Structure / Function / Physical Skills: ADL,Decreased knowledge of precautions,ROM,UE functional use,FMC,Dexterity,Body mechanics,Decreased knowledge of use of DME,Balance,Vision,GMC,Pain,Strength,IADL,Coordination Cognitive Skills: Attention,Problem Solve,Safety Awareness     Visit Diagnosis: Ataxia  Muscle weakness (generalized)  Other lack of coordination  Attention and concentration deficit    Problem List Patient Active Problem List   Diagnosis Date Noted  . PAF (paroxysmal atrial fibrillation) ([place]) 12/30/2020  . Heme + stool 12/27/2020  . Hyponatremia 12/27/2020  . Anemia 12/27/2020  . Protein-calorie malnutrition, severe 12/16/2020  . Encephalopathy 12/10/2020  . Grand mal seizure ([place]) 12/02/2020  . COPD (chronic obstructive pulmonary disease) ([place]) 12/02/2020  . Malnutrition of moderate degree 11/28/2020  . Acute encephalopathy   . Acute respiratory failure with hypoxia ([place])   . Aspiration into airway   . Cardiac arrest ([place])   . Groin mass 08/20/2020  . Falls infrequently 03/27/2019  . Sensory neuropathy 02/01/2019  . 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 Cerebellar ataxia ([place]) 08/23/2018  . Sensory polyneuropathy 08/23/2018  . Incontinence of feces   . Visual disturbances 02/06/2017  . Essential hypertension 11/07/2016  . Hyperlipidemia 04/24/2015  . Dysuria 02/11/2015  . Acute cystitis without hematuria 02/11/2015  . Chest pain 02/06/2015  . Left inguinal hernia 08/02/2013  . Gait abnormality 05/14/2013  . Gait disorder   . Nystagmus, end-position   . Routine health maintenance 12/13/2011  . LEG CRAMPS, NOCTURNAL 12/07/2010  . CALLUS, TOE 03/19/2010   . hip pain 03/19/2010  . ATAXIA 11/17/2008  . CERVICAL RADICULOPATHY, RIGHT 10/21/2008  . DIVERTICULOSIS OF COLON 08/14/2008  . RENAL CYST 08/14/2008  . ANEMIA-IRON DEFICIENCY 06/16/2007  . Asthma 06/16/2007  . GERD 06/16/2007  . OSTEOPENIA 06/16/2007  Kathrine Cords, OTR/L, MSOT 01/15/2021, 8:49 PM  [address]. [place], [place], [address] Phone: [phone]   Fax:  [phone]  Name: MERCADIES CO MRN: [id number] Date of Birth: [birth year]

## 2021-01-18 ENCOUNTER — Ambulatory Visit: Payer: Medicare Other | Admitting: Physical Therapy

## 2021-01-18 ENCOUNTER — Ambulatory Visit: Payer: Medicare Other | Admitting: Speech Pathology

## 2021-01-18 ENCOUNTER — Encounter: Payer: Self-pay | Admitting: Occupational Therapy

## 2021-01-18 ENCOUNTER — Encounter: Payer: Self-pay | Admitting: Physical Therapy

## 2021-01-18 ENCOUNTER — Ambulatory Visit: Payer: Medicare Other | Admitting: Occupational Therapy

## 2021-01-18 ENCOUNTER — Encounter: Payer: Self-pay | Admitting: Speech Pathology

## 2021-01-18 ENCOUNTER — Other Ambulatory Visit: Payer: Self-pay

## 2021-01-18 VITALS — BP 145/94

## 2021-01-18 DIAGNOSIS — R41840 Attention and concentration deficit: Secondary | ICD-10-CM

## 2021-01-18 DIAGNOSIS — R41841 Cognitive communication deficit: Secondary | ICD-10-CM | POA: Diagnosis not present

## 2021-01-18 DIAGNOSIS — R278 Other lack of coordination: Secondary | ICD-10-CM

## 2021-01-18 DIAGNOSIS — M6281 Muscle weakness (generalized): Secondary | ICD-10-CM | POA: Diagnosis not present

## 2021-01-18 DIAGNOSIS — R270 Ataxia, unspecified: Secondary | ICD-10-CM | POA: Diagnosis not present

## 2021-01-18 DIAGNOSIS — R2689 Other abnormalities of gait and mobility: Secondary | ICD-10-CM | POA: Diagnosis not present

## 2021-01-18 NOTE — Therapy (Signed)
[address]. [place], [place], [address] Phone: [phone]   Fax:  [phone]  Physical Therapy Treatment  Patient Details  Name: Kristine Francis MRN: [id number] Date of Birth: 1941-11-08 Referring Provider (PT): Cathlyn Parsons, PA-C   Encounter Date: 01/18/2021   PT End of Session - 01/18/21 1629    Visit Number 4    Number of Visits 17    Date for PT Re-Evaluation 03/01/21    PT Start Time 1517    PT Stop Time 1611    PT Time Calculation (min) 41 min    Equipment Utilized During Treatment Gait belt    Activity Tolerance Patient tolerated treatment well    Behavior During Therapy [facility] for tasks assessed/performed           Past Medical History:  Diagnosis Date  . Allergy   . Asthma    sees Dr. Tiajuana Amass - as child per pt  . At high risk for falls    unstable gait   . Ataxia   . Cataract    removed bilaterally   . Diverticulosis   . Emphysema   . Gait abnormality 05/14/2013   Ms.Ahmed a patient of Dr. Linda Hedges presented first interpreter thousand 13 with a gait dysfunction, she also had an episodic confusion and Loss device. Exam found a mild nystagmus and she was referred to ophthalmology on 03-2812, brain MRI was normal nystagmus was a secondary diagnosis to vestibulitis, ear nose and throat has followed and had seen the patient. At the time of the nystagmus the patie  . GERD (gastroesophageal reflux disease)   . Hepatic steatosis   . Hyperlipidemia    on atorvastatin   . Hypertension    on losartan   . Iron deficiency anemia, unspecified   . Kidney cysts 11/16/12   small cyst on left  . Nystagmus, end-position   . Osteopenia   . Renal cyst   . Stroke ([place]) 01/2017   mild     Past Surgical History:  Procedure Laterality Date  . ANAL RECTAL MANOMETRY N/A 12/20/2017   Procedure: ANO RECTAL MANOMETRY;  Surgeon: Mauri Pole, MD;  Location: [facility];  Service: Endoscopy;   Laterality: N/A;  . BREAST BIOPSY Bilateral 1970's  . CATARACT EXTRACTION Bilateral LT:02/14/13,RT:02/21/13  . CERVICAL SPINE SURGERY  2010  . COLONOSCOPY  03-12-14   per Dr. Olevia Perches, diverticulosis only, repeat 5 yrs   . EYE SURGERY Bilateral 01/2013&02/2013   left then right  . HERNIA REPAIR  Oct '14-left, remote-right   years ago right; left done '14  . SKIN CANCER EXCISION  05/13/13   FACE, basal cell  . TONSILLECTOMY      Vitals:   01/18/21 1533 01/18/21 1551 01/18/21 1553  BP: 127/84 (!) 159/94 (!) 145/94     Subjective Assessment - 01/18/21 1533    Subjective Pt husband reports no new changes this rx.    Currently in Pain? No/denies                             [facility] Adult PT Treatment/Exercise - 01/18/21 1533      Ambulation/Gait   Gait Comments gait with RW; CGA-minA. Widened BOS, mild ataxia, RW helps to stabilize. Cues for sequencing esp with turning to sit      Knee/Hip Exercises: Aerobic   Nustep L5 x 7 min      Knee/Hip Exercises:  Seated   Long Arc Quad Both;2 sets;10 reps    Long Arc Quad Weight 2 lbs.    Ball Squeeze 1x15 3 sec hold    Clamshell with TheraBand Green    Marching Both;2 sets;10 reps    [employer] 2 lbs.    Hamstring Curl Both;2 sets;10 reps    Hamstring Limitations red tb    Sit to Sand 2 sets;10 reps;without UE support   holding ball to avoid using UEs                   PT Short Term Goals - 01/13/21 1511      PT SHORT TERM GOAL #1   Title Pt and caregiver/spouse independent with initiating initial HEP    Baseline spouse reports difficult to get pt to maintain attention for HEP; educated on providing visual cuing    Time 2    Period Weeks    Status On-going    Target Date 01/18/21      PT SHORT TERM GOAL #2   Title Pt will improve five time sit to stand from mat, no UE support to </= 15 seconds but with improved anterior weight shift and decreased posterior LOB    Baseline able to perform with no UE  support but does demo lack of anterior weight shift and occasional posterior LOB    Time 4    Period Weeks    Status On-going    Target Date 02/01/21      PT SHORT TERM GOAL #3   Title Pt will demo improved static standing balance to at least 1 minute with close supervision and minimal sway, without LOB    Baseline ~20 sec this rx    Time 4    Period Weeks    Status On-going    Target Date 02/01/21      PT SHORT TERM GOAL #4   Title Pt and caregiver will demo good understanding of falls prevention and safe functional mobility within the home to prevent falls    Time 2    Period Weeks    Status On-going    Target Date 01/18/21             PT Long Term Goals - 01/04/21 1640      PT LONG TERM GOAL #1   Title Pt will demonstrate independence with final HEP    Time 8    Period Weeks    Status New    Target Date 03/01/21      PT LONG TERM GOAL #2   Title Pt will improve five time sit to stand without use of UE to </= 13 seconds and no posterior LOB    Time 8    Period Weeks    Status New    Target Date 03/01/21      PT LONG TERM GOAL #3   Title Pt will demo BLE strength at least 4+/5    Time 8    Period Weeks    Status New    Target Date 03/01/21      PT LONG TERM GOAL #4   Title Pt will improve BERG by at least 10  points to indicate decreased falls risk    Baseline 24/56    Time 8    Period Weeks    Status New    Target Date 03/01/21      PT LONG TERM GOAL #5   Title Pt will ambulate and complete direction changes  with least restrictive assistive device safely without LOB to facilitate improved safety with community negotiation    Time 8    Period Weeks    Status New    Target Date 03/01/21                 Plan - 01/18/21 1629    Clinical Impression Statement Pt tolerated treatment well today. Reports of low BP from prior therapy session; checked at beginning of session and WFL. Cuing to maintain RW within BOS with ambulation; some inattention to  L side. Cues for upright sitting with seated ex's; tendency to lean back as back/core fatigues. Occasional posterior LOB with STS exercise. Limited carryover.    PT Treatment/Interventions ADLs/Self Care Home Management;Aquatic Therapy;DME Instruction;Gait training;Stair training;Functional mobility training;Therapeutic activities;Therapeutic exercise;Balance training;Neuromuscular re-education;Patient/family education;Vestibular;Visual/perceptual remediation/compensation;Manual techniques;Taping    PT Next Visit Plan Assess HEP. Progressive TE and balance/neurore-ed exercises as tolerated. Frequent cues for safety. Standing balance/vestibular/multi-sensory balance activities, postural exercises, dynamic balance/ balance recovery.    Consulted and Agree with Plan of Care Patient           Patient will benefit from skilled therapeutic intervention in order to improve the following deficits and impairments:  Abnormal gait,Decreased balance,Decreased coordination,Decreased strength,Difficulty walking,Impaired tone,Impaired UE functional use,Impaired vision/preception,Postural dysfunction,Decreased mobility,Decreased safety awareness,Decreased endurance  Visit Diagnosis: Ataxia  Muscle weakness (generalized)  Other lack of coordination  Other abnormalities of gait and mobility     Problem List Patient Active Problem List   Diagnosis Date Noted  . PAF (paroxysmal atrial fibrillation) ([place]) 12/30/2020  . Heme + stool 12/27/2020  . Hyponatremia 12/27/2020  . Anemia 12/27/2020  . Protein-calorie malnutrition, severe 12/16/2020  . Encephalopathy 12/10/2020  . Grand mal seizure ([place]) 12/02/2020  . COPD (chronic obstructive pulmonary disease) ([place]) 12/02/2020  . Malnutrition of moderate degree 11/28/2020  . Acute encephalopathy   . Acute respiratory failure with hypoxia ([place])   . Aspiration into airway   . Cardiac arrest ([place])   . Groin mass 08/20/2020  . Falls infrequently 03/27/2019   . Sensory neuropathy 02/01/2019  . 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 Cerebellar ataxia ([place]) 08/23/2018  . Sensory polyneuropathy 08/23/2018  . Incontinence of feces   . Visual disturbances 02/06/2017  . Essential hypertension 11/07/2016  . Hyperlipidemia 04/24/2015  . Dysuria 02/11/2015  . Acute cystitis without hematuria 02/11/2015  . Chest pain 02/06/2015  . Left inguinal hernia 08/02/2013  . Gait abnormality 05/14/2013  . Gait disorder   . Nystagmus, end-position   . Routine health maintenance 12/13/2011  . LEG CRAMPS, NOCTURNAL 12/07/2010  . CALLUS, TOE 03/19/2010  . hip pain 03/19/2010  . ATAXIA 11/17/2008  . CERVICAL RADICULOPATHY, RIGHT 10/21/2008  . DIVERTICULOSIS OF COLON 08/14/2008  . RENAL CYST 08/14/2008  . ANEMIA-IRON DEFICIENCY 06/16/2007  . Asthma 06/16/2007  . GERD 06/16/2007  . OSTEOPENIA 06/16/2007   Amador Cunas, PT, DPT Donald Prose Massiah Minjares 01/18/2021, 4:36 PM  [facility]. [place], [place], [address] Phone: [phone]   Fax:  [phone]  Name: ANICIA LEUTHOLD MRN: [id number] Date of Birth: 07-17-1941

## 2021-01-18 NOTE — Therapy (Signed)
[facility]. [place], [place], [address] Phone: [phone]   Fax:  [phone]  Occupational Therapy Treatment  Patient Details  Name: Kristine Francis MRN: [id number] Date of Birth: 03-20-1941 Referring Provider (OT): Lauraine Rinne, PA-C   Encounter Date: 01/18/2021   OT End of Session - 01/18/21 1507    Visit Number 4    Number of Visits 13    Authorization Type Medicare    Progress Note Due on Visit 10    OT Start Time 1447   Previous therapy session ran over   OT Stop Time 1530    OT Time Calculation (min) 43 min    Activity Tolerance Patient tolerated treatment well    Behavior During Therapy [facility] for tasks assessed/performed           Past Medical History:  Diagnosis Date  . Allergy   . Asthma    sees Dr. Tiajuana Amass - as child per pt  . At high risk for falls    unstable gait   . Ataxia   . Cataract    removed bilaterally   . Diverticulosis   . Emphysema   . Gait abnormality 05/14/2013   Ms.Lagunes a patient of Dr. Linda Hedges presented first interpreter thousand 13 with a gait dysfunction, she also had an episodic confusion and Loss device. Exam found a mild nystagmus and she was referred to ophthalmology on 03-2812, brain MRI was normal nystagmus was a secondary diagnosis to vestibulitis, ear nose and throat has followed and had seen the patient. At the time of the nystagmus the patie  . GERD (gastroesophageal reflux disease)   . Hepatic steatosis   . Hyperlipidemia    on atorvastatin   . Hypertension    on losartan   . Iron deficiency anemia, unspecified   . Kidney cysts 11/16/12   small cyst on left  . Nystagmus, end-position   . Osteopenia   . Renal cyst   . Stroke ([place]) 01/2017   mild     Past Surgical History:  Procedure Laterality Date  . ANAL RECTAL MANOMETRY N/A 12/20/2017   Procedure: ANO RECTAL MANOMETRY;  Surgeon: Mauri Pole, MD;  Location: [facility];  Service:  Endoscopy;  Laterality: N/A;  . BREAST BIOPSY Bilateral 1970's  . CATARACT EXTRACTION Bilateral LT:02/14/13,RT:02/21/13  . CERVICAL SPINE SURGERY  2010  . COLONOSCOPY  03-12-14   per Dr. Olevia Perches, diverticulosis only, repeat 5 yrs   . EYE SURGERY Bilateral 01/2013&02/2013   left then right  . HERNIA REPAIR  Oct '14-left, remote-right   years ago right; left done '14  . SKIN CANCER EXCISION  05/13/13   FACE, basal cell  . TONSILLECTOMY      There were no vitals filed for this visit.   Subjective Assessment - 01/18/21 1510    Subjective  SLP reported that pt seemed lethargic and less responsive that how she typically presents, also mentioning that her BP recording was low, but that she appeared to start feeling better within last approx. 5 minutes.    Patient is accompanied by: Family member   [name] (husband)   Pertinent History CVA (March 2018), ataxia, COPD, osteopenia, neuropathy, gait abnormality, asthma, iron deficiency anemia, GERD, and hx nystagmus    Patient Stated Goals Get back to being independent; play tennis again    Currently in Pain? No/denies            OT Treatments/Exercises (OP) - 01/18/21 1501  Bed Mobility   Supine to Sit Minimal Assistance - Patient > 75%   Required verbal cues to roll to correct side and assistance w/ BLEs when transitioning to sitting   Sitting - Scoot to Edge of Bed Contact Guard/Touching assist    Sit to Supine Minimal Assistance - Patient > 75%   Required assist w/ BLEs     Shoulder Exercises: Supine   Horizontal ABduction Strengthening;Both;15 reps;Weights    Horizontal ABduction Weight (lbs) 1 lb dowel rod    Flexion Strengthening;Both;15 reps;Weights    Shoulder Flexion Weight (lbs) 1 lb dowel rod    Diagonals Strengthening;Both;15 reps;Weights    Diagonals Weight (lbs) 1 lb dowel rod    Other Supine Exercises Chest press strengthening completed 15x w/ BUE holding 1 lb dowel rod    Other Supine Exercises Bicep curls completed 15x w/  1 lb dowel rod while in supine            OT Short Term Goals - 01/14/21 0818      OT SHORT TERM GOAL #1   Title Pt will be able to complete initial HEP with Min A from caregiver    Baseline No current HEP    Time 3    Period Weeks    Status On-going    Target Date 01/26/21      OT SHORT TERM GOAL #2   Title Pt will be able to complete asymmetrical bilateral coordination activities independently in at least 3/4 trials to improve functional UE use during ADLs.    Baseline Decreased BUE GM coordination    Time 3    Period Weeks    Status On-going            OT Long Term Goals - 01/14/21 0819      OT LONG TERM GOAL #1   Title Pt will complete HEP designed for BUE strength and coordination with Mod I and report carryover to home    Baseline No current HEP    Time 6    Period Weeks    Status On-going      OT LONG TERM GOAL #2   Title Pt will demonstrate preparing a simple meal safely with Min A    Baseline Currently unable to prepare meals safely    Time 6    Period Weeks    Status On-going      OT LONG TERM GOAL #3   Title Pt will be able to safely complete UB/LB dressing with Mod I at least 75% of the time    Baseline Currently SPV/SBA for dressing    Time 6    Period Weeks    Status On-going      OT LONG TERM GOAL #4   Title Pt will be able to manipulate clothing fasteners without assistance in at least 4/5 trials    Baseline Decreased [place] as evidenced by time on 9-Hole Peg Test    Time 6    Period Weeks    Status On-going      OT LONG TERM GOAL #5   Title Pt will verbalize understanding of at least 3 compensatory/safety strategies to improve safety with ADLs and functional recall    Baseline Decreased safety awareness and recall; no current compensatory strategies for ADLs    Time 6    Period Weeks    Status On-going            Plan - 01/18/21 1758    Clinical Impression Statement Due  to low BP recording and pt not feeling well during previous  session, OT transferred pt to therapy mat for gentle BUE strengthening this session. Pt responded well and appeared to improve throughout session; BP was taken when transitioning to PT and recording was 145/94, which is more typical for pt, per husband's report.    OT Occupational Profile and History Detailed Assessment- Review of Records and additional review of physical, cognitive, psychosocial history related to current functional performance    Occupational performance deficits (Please refer to evaluation for details): ADL's;IADL's;Leisure    Body Structure / Function / Physical Skills ADL;Decreased knowledge of precautions;ROM;UE functional use;FMC;Dexterity;Body mechanics;Decreased knowledge of use of DME;Balance;Vision;GMC;Pain;Strength;IADL;Coordination    Cognitive Skills Attention;Problem Solve;Safety Awareness    Rehab Potential Good    Clinical Decision Making Several treatment options, min-mod task modification necessary    Comorbidities Affecting Occupational Performance: Presence of comorbidities impacting occupational performance    Modification or Assistance to Complete Evaluation  Min-Moderate modification of tasks or assist with assess necessary to complete eval    OT Frequency 2x / week    OT Duration 6 weeks    OT Treatment/Interventions Self-care/ADL training;Therapeutic exercise;Visual/perceptual remediation/compensation;Aquatic Therapy;Moist Heat;Neuromuscular education;Patient/family education;Energy conservation;Therapeutic activities;Fluidtherapy;Cryotherapy;DME and/or AE instruction;Manual Therapy;Passive range of motion;Cognitive remediation/compensation    Plan Address IADLs    Consulted and Agree with Plan of Care Patient;Family member/caregiver    Family Member Consulted Husband (Richard)           Patient will benefit from skilled therapeutic intervention in order to improve the following deficits and impairments:   Body Structure / Function / Physical Skills:  ADL,Decreased knowledge of precautions,ROM,UE functional use,FMC,Dexterity,Body mechanics,Decreased knowledge of use of DME,Balance,Vision,GMC,Pain,Strength,IADL,Coordination Cognitive Skills: Attention,Problem Solve,Safety Awareness     Visit Diagnosis: Ataxia  Muscle weakness (generalized)  Other lack of coordination  Attention and concentration deficit    Problem List Patient Active Problem List   Diagnosis Date Noted  . PAF (paroxysmal atrial fibrillation) ([place]) 12/30/2020  . Heme + stool 12/27/2020  . Hyponatremia 12/27/2020  . Anemia 12/27/2020  . Protein-calorie malnutrition, severe 12/16/2020  . Encephalopathy 12/10/2020  . Grand mal seizure ([place]) 12/02/2020  . COPD (chronic obstructive pulmonary disease) ([place]) 12/02/2020  . Malnutrition of moderate degree 11/28/2020  . Acute encephalopathy   . Acute respiratory failure with hypoxia ([place])   . Aspiration into airway   . Cardiac arrest ([place])   . Groin mass 08/20/2020  . Falls infrequently 03/27/2019  . Sensory neuropathy 02/01/2019  . 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 Cerebellar ataxia ([place]) 08/23/2018  . Sensory polyneuropathy 08/23/2018  . Incontinence of feces   . Visual disturbances 02/06/2017  . Essential hypertension 11/07/2016  . Hyperlipidemia 04/24/2015  . Dysuria 02/11/2015  . Acute cystitis without hematuria 02/11/2015  . Chest pain 02/06/2015  . Left inguinal hernia 08/02/2013  . Gait abnormality 05/14/2013  . Gait disorder   . Nystagmus, end-position   . Routine health maintenance 12/13/2011  . LEG CRAMPS, NOCTURNAL 12/07/2010  . CALLUS, TOE 03/19/2010  . hip pain 03/19/2010  . ATAXIA 11/17/2008  . CERVICAL RADICULOPATHY, RIGHT 10/21/2008  . DIVERTICULOSIS OF COLON 08/14/2008  . RENAL CYST 08/14/2008  . ANEMIA-IRON DEFICIENCY 06/16/2007  . Asthma 06/16/2007  . GERD 06/16/2007  . OSTEOPENIA 06/16/2007      Kathrine Cords, OTR/L, MSOT 01/18/2021, 6:08 PM  [place]. [place], [place], [address] Phone: [phone]   Fax:  [phone]  Name: Kristine Francis MRN:  [id number] Date of Birth: Jul 25, 1941

## 2021-01-18 NOTE — Therapy (Signed)
[facility]. [place], [place], [address] Phone: [phone]   Fax:  [phone]  Speech Language Pathology Treatment  Patient Details  Name: Kristine Francis MRN: [id number] Date of Birth: 1941-09-29 No data recorded  Encounter Date: 01/18/2021   End of Session - 01/18/21 1420    Visit Number 4    Number of Visits 25    Date for SLP Re-Evaluation 04/03/21    SLP Start Time 75    SLP Stop Time  1445    SLP Time Calculation (min) 36 min    Activity Tolerance Patient tolerated treatment well           Past Medical History:  Diagnosis Date  . Allergy   . Asthma    sees Dr. Tiajuana Francis - as child per pt  . At high risk for falls    unstable gait   . Ataxia   . Cataract    removed bilaterally   . Diverticulosis   . Emphysema   . Gait abnormality 05/14/2013   Ms.Kristine Francis a patient of Dr. Linda Francis presented first interpreter thousand 13 with a gait dysfunction, she also had an episodic confusion and Loss device. Exam found a mild nystagmus and she was referred to ophthalmology on 03-2812, brain MRI was normal nystagmus was a secondary diagnosis to vestibulitis, ear nose and throat has followed and had seen the patient. At the time of the nystagmus the patie  . GERD (gastroesophageal reflux disease)   . Hepatic steatosis   . Hyperlipidemia    on atorvastatin   . Hypertension    on losartan   . Iron deficiency anemia, unspecified   . Kidney cysts 11/16/12   small cyst on left  . Nystagmus, end-position   . Osteopenia   . Renal cyst   . Stroke ([place]) 01/2017   mild     Past Surgical History:  Procedure Laterality Date  . ANAL RECTAL MANOMETRY N/A 12/20/2017   Procedure: ANO RECTAL MANOMETRY;  Surgeon: Mauri Pole, MD;  Location: [facility];  Service: Endoscopy;  Laterality: N/A;  . BREAST BIOPSY Bilateral 1970's  . CATARACT EXTRACTION Bilateral LT:02/14/13,RT:02/21/13  . CERVICAL SPINE SURGERY  2010   . COLONOSCOPY  03-12-14   per Dr. Olevia Perches, diverticulosis only, repeat 5 yrs   . EYE SURGERY Bilateral 01/2013&02/2013   left then right  . HERNIA REPAIR  Oct '14-left, remote-right   years ago right; left done '14  . SKIN CANCER EXCISION  05/13/13   FACE, basal cell  . TONSILLECTOMY      There were no vitals filed for this visit.   Subjective Assessment - 01/18/21 1412    Subjective "You had a pretty big weekend."    Currently in Pain? No/denies                 ADULT SLP TREATMENT - 01/18/21 0001      General Information   Behavior/Cognition Alert;Cooperative;Pleasant mood;Requires cueing      Treatment Provided   Treatment provided Cognitive-Linquistic      Cognitive-Linquistic Treatment   Treatment focused on Cognition    Skilled Treatment Treatment focused spaced retrieval and divergent naming for concrete categories. Pt was able to name 3 independently and required extensive verbal cueing to name 8. 30 min into the session, pt became lethargic and was taking increased cueing. SLP retrieved director to take blood pressure manually. Pt blood pressured noted to be 80/70. Spouse reported that is "  low for her".  Pt and spouse directed to reach out to doctor regarding blood pressure drop.      Progression Toward Goals   Progression toward goals Progressing toward goals              SLP Short Term Goals - 01/18/21 1423      SLP SHORT TERM GOAL #1   Title Patient will tell SLP 3 areas of deficit/challenge for her given 2 verbal cues.    Time 5    Period Weeks    Status On-going      SLP SHORT TERM GOAL #2   Title Patient will recall personally-relevant information post >5 minute delay.    Baseline <5 min    Time 5    Period Weeks    Status On-going      SLP SHORT TERM GOAL #3   Title The patient will complete swallowing maneuvers (supraglottic swallow, Mendelson maneuver, effortful swallow, etc.) to improve oral motor weakness, tongue base retraction,  hyolaryngeal excursion, airway protection, and clearance of the bolus through the pharynx with moderate verbal, visual and tactile cues.    Time 5    Period Weeks    Status On-going            SLP Long Term Goals - 01/18/21 1423      SLP LONG TERM GOAL #1   Title Client will utilize compensatory strategies with optimum safety and efficiency of swallowing function of P.O. intake without overt signs and symptoms of aspiration for the highest appropriate diet level.    Baseline honey thick/dysphagia 2/minced and moist    Time 11    Period Weeks    Status On-going      SLP LONG TERM GOAL #2   Title Patient will demonstrate use of memory strategies to schedule activities, recall weekly events and items to maintain safety to participate socially in functional living environment    Time 11    Period Weeks    Status On-going      SLP LONG TERM GOAL #3   Title Patient will demonstrate use of information processing and self monitoring during daily living activities to improve safety and awareness in functional living environment    Time 11    Period Weeks    Status On-going      SLP LONG TERM GOAL #4   Title Patient will develop functional attention skills to effectively attend to and communicate in tasks of daily living in their functional living environment.    Time 11    Period Weeks    Status On-going            Plan - 01/18/21 1422    Clinical Impression Statement Pt is a 80 yo female with hx of encephalopathy 2/2 to seizures. Hx of intubation and ventilator use. Dysphagia. Dysarthria. Cognitive deficit. Pt was given the SLUMS which indicated a deficit in attention, short term memory, problem solving, and executive functioning skills. Pt husband is responsible for all ADLs due to significant memory deficits. Pt reports she would like to be more self-sufficient with medications and her thinking skills. Previous documentation and MBS rec dysphagia 2 diet and honey thick liquids. SLP  to address "minced and moist (IDDSI)" terminology with patient and spouse. Oropharyngeal dysphagia to be addressed in treatment. Dysarthria noted in conversation with impaired articulation. Gurgly vocal quality noted. "Silent aspiration" addressed with patient and spouse. SLP rec skilled speech services to address cognitive-linguistic deficit, dysphagia, and dysarthria treatments to  increase quality of life and participation.    Speech Therapy Frequency 2x / week    Duration 12 weeks    Treatment/Interventions Aspiration precaution training;Trials of upgraded texture/liquids;Compensatory strategies;Pharyngeal strengthening exercises;Oral motor exercises;Cueing hierarchy;Functional tasks;Patient/family education;Diet toleration management by SLP;Environmental controls;Cognitive reorganization;Compensatory techniques;Internal/external aids;SLP instruction and feedback    Potential to Achieve Goals Fair    Potential Considerations Ability to learn/carryover information    Consulted and Agree with Plan of Care Patient;Family member/caregiver    Family Member Consulted [name] (spouse)           Patient will benefit from skilled therapeutic intervention in order to improve the following deficits and impairments:   Cognitive communication deficit    Problem List Patient Active Problem List   Diagnosis Date Noted  . PAF (paroxysmal atrial fibrillation) ([place]) 12/30/2020  . Heme + stool 12/27/2020  . Hyponatremia 12/27/2020  . Anemia 12/27/2020  . Protein-calorie malnutrition, severe 12/16/2020  . Encephalopathy 12/10/2020  . Grand mal seizure ([place]) 12/02/2020  . COPD (chronic obstructive pulmonary disease) ([place]) 12/02/2020  . Malnutrition of moderate degree 11/28/2020  . Acute encephalopathy   . Acute respiratory failure with hypoxia ([place])   . Aspiration into airway   . Cardiac arrest ([place])   . Groin mass 08/20/2020  . Falls infrequently 03/27/2019  . Sensory neuropathy 02/01/2019  .  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 Cerebellar ataxia ([place]) 08/23/2018  . Sensory polyneuropathy 08/23/2018  . Incontinence of feces   . Visual disturbances 02/06/2017  . Essential hypertension 11/07/2016  . Hyperlipidemia 04/24/2015  . Dysuria 02/11/2015  . Acute cystitis without hematuria 02/11/2015  . Chest pain 02/06/2015  . Left inguinal hernia 08/02/2013  . Gait abnormality 05/14/2013  . Gait disorder   . Nystagmus, end-position   . Routine health maintenance 12/13/2011  . LEG CRAMPS, NOCTURNAL 12/07/2010  . CALLUS, TOE 03/19/2010  . hip pain 03/19/2010  . ATAXIA 11/17/2008  . CERVICAL RADICULOPATHY, RIGHT 10/21/2008  . DIVERTICULOSIS OF COLON 08/14/2008  . RENAL CYST 08/14/2008  . ANEMIA-IRON DEFICIENCY 06/16/2007  . Asthma 06/16/2007  . GERD 06/16/2007  . OSTEOPENIA 06/16/2007    Verdene Lennert MS, [place], CBIS  01/18/2021, 3:40 PM  [facility]. [place], [place], [address] Phone: [phone]   Fax:  [phone]   Name: Kristine Francis MRN: [id number] Date of Birth: 02-24-1941

## 2021-01-20 ENCOUNTER — Encounter: Payer: Self-pay | Admitting: Occupational Therapy

## 2021-01-20 ENCOUNTER — Encounter: Payer: Self-pay | Admitting: Speech Pathology

## 2021-01-20 ENCOUNTER — Ambulatory Visit: Payer: Medicare Other | Attending: Physician Assistant

## 2021-01-20 ENCOUNTER — Ambulatory Visit: Payer: Medicare Other | Admitting: Occupational Therapy

## 2021-01-20 ENCOUNTER — Ambulatory Visit: Payer: Medicare Other | Admitting: Speech Pathology

## 2021-01-20 ENCOUNTER — Other Ambulatory Visit: Payer: Self-pay

## 2021-01-20 DIAGNOSIS — R41841 Cognitive communication deficit: Secondary | ICD-10-CM

## 2021-01-20 DIAGNOSIS — R270 Ataxia, unspecified: Secondary | ICD-10-CM | POA: Diagnosis not present

## 2021-01-20 DIAGNOSIS — R278 Other lack of coordination: Secondary | ICD-10-CM | POA: Insufficient documentation

## 2021-01-20 DIAGNOSIS — R1312 Dysphagia, oropharyngeal phase: Secondary | ICD-10-CM

## 2021-01-20 DIAGNOSIS — M6281 Muscle weakness (generalized): Secondary | ICD-10-CM | POA: Insufficient documentation

## 2021-01-20 DIAGNOSIS — R2681 Unsteadiness on feet: Secondary | ICD-10-CM | POA: Diagnosis not present

## 2021-01-20 DIAGNOSIS — R41840 Attention and concentration deficit: Secondary | ICD-10-CM

## 2021-01-20 DIAGNOSIS — G40409 Other generalized epilepsy and epileptic syndromes, not intractable, without status epilepticus: Secondary | ICD-10-CM | POA: Insufficient documentation

## 2021-01-20 DIAGNOSIS — R260 Ataxic gait: Secondary | ICD-10-CM | POA: Insufficient documentation

## 2021-01-20 DIAGNOSIS — G9340 Encephalopathy, unspecified: Secondary | ICD-10-CM | POA: Diagnosis not present

## 2021-01-20 DIAGNOSIS — R2689 Other abnormalities of gait and mobility: Secondary | ICD-10-CM | POA: Insufficient documentation

## 2021-01-20 NOTE — Therapy (Signed)
[facility] [place], [place], [address] Phone: [phone]   Fax:  [phone]  Speech Language Pathology Treatment  Patient Details  Name: Kristine Francis MRN: [id number] Date of Birth: 12/24/1940 No data recorded  Encounter Date: 01/20/2021   End of Session - 01/20/21 1520    Visit Number 5    Number of Visits 25    Date for SLP Re-Evaluation 04/03/21    SLP Start Time 1445    SLP Stop Time  0998    SLP Time Calculation (min) 45 min    Activity Tolerance Patient tolerated treatment well           Past Medical History:  Diagnosis Date  . Allergy   . Asthma    sees Dr. Tiajuana Amass - as child per pt  . At high risk for falls    unstable gait   . Ataxia   . Cataract    removed bilaterally   . Diverticulosis   . Emphysema   . Gait abnormality 05/14/2013   Ms.Virgen a patient of Dr. Linda Hedges presented first interpreter thousand 13 with a gait dysfunction, she also had an episodic confusion and Loss device. Exam found a mild nystagmus and she was referred to ophthalmology on 03-2812, brain MRI was normal nystagmus was a secondary diagnosis to vestibulitis, ear nose and throat has followed and had seen the patient. At the time of the nystagmus the patie  . GERD (gastroesophageal reflux disease)   . Hepatic steatosis   . Hyperlipidemia    on atorvastatin   . Hypertension    on losartan   . Iron deficiency anemia, unspecified   . Kidney cysts 11/16/12   small cyst on left  . Nystagmus, end-position   . Osteopenia   . Renal cyst   . Stroke ([place]) 01/2017   mild     Past Surgical History:  Procedure Laterality Date  . ANAL RECTAL MANOMETRY N/A 12/20/2017   Procedure: ANO RECTAL MANOMETRY;  Surgeon: Mauri Pole, MD;  Location: [facility];  Service: Endoscopy;  Laterality: N/A;  . BREAST BIOPSY Bilateral 1970's  . CATARACT EXTRACTION Bilateral LT:02/14/13,RT:02/21/13  . CERVICAL SPINE SURGERY  2010  .  COLONOSCOPY  03-12-14   per Dr. Olevia Perches, diverticulosis only, repeat 5 yrs   . EYE SURGERY Bilateral 01/2013&02/2013   left then right  . HERNIA REPAIR  Oct '14-left, remote-right   years ago right; left done '14  . SKIN CANCER EXCISION  05/13/13   FACE, basal cell  . TONSILLECTOMY      There were no vitals filed for this visit.   Subjective Assessment - 01/20/21 1518    Subjective "I am feeling good."    Patient is accompained by: Family member    Currently in Pain? No/denies                 ADULT SLP TREATMENT - 01/20/21 1508      General Information   Behavior/Cognition Alert;Cooperative;Pleasant mood;Requires cueing      Treatment Provided   Treatment provided Cognitive-Linquistic;Dysphagia      Cognitive-Linquistic Treatment   Treatment focused on Cognition    Skilled Treatment Treatment focused on visual attention and memory during a functional activity. Pt required moderate cueing throughout session. Also addressed dysphagia. Pt worked on effortful swallows wth apple sauce (preferred food).      Assessment / Recommendations / Plan   Plan Continue with current plan of care  Progression Toward Goals   Progression toward goals Progressing toward goals            SLP Education - 01/20/21 1519    Education Details Provided edu on cognitive impairments.    Person(s) Educated Patient;Spouse    Methods Explanation    Comprehension Verbalized understanding;Need further instruction            SLP Short Term Goals - 01/20/21 1523      SLP SHORT TERM GOAL #1   Title Patient will tell SLP 3 areas of deficit/challenge for her given 2 verbal cues.    Time 5    Period Weeks    Status On-going      SLP SHORT TERM GOAL #2   Title Patient will recall personally-relevant information post >5 minute delay.    Baseline <5 min    Time 5    Period Weeks    Status On-going      SLP SHORT TERM GOAL #3   Title The patient will complete swallowing maneuvers  (supraglottic swallow, Mendelson maneuver, effortful swallow, etc.) to improve oral motor weakness, tongue base retraction, hyolaryngeal excursion, airway protection, and clearance of the bolus through the pharynx with moderate verbal, visual and tactile cues.    Time 5    Period Weeks    Status On-going            SLP Long Term Goals - 01/20/21 1524      SLP LONG TERM GOAL #1   Title Client will utilize compensatory strategies with optimum safety and efficiency of swallowing function of P.O. intake without overt signs and symptoms of aspiration for the highest appropriate diet level.    Baseline honey thick/dysphagia 2/minced and moist    Time 11    Period Weeks    Status On-going      SLP LONG TERM GOAL #2   Title Patient will demonstrate use of memory strategies to schedule activities, recall weekly events and items to maintain safety to participate socially in functional living environment    Time 11    Period Weeks    Status On-going      SLP LONG TERM GOAL #3   Title Patient will demonstrate use of information processing and self monitoring during daily living activities to improve safety and awareness in functional living environment    Time 11    Period Weeks    Status On-going      SLP LONG TERM GOAL #4   Title Patient will develop functional attention skills to effectively attend to and communicate in tasks of daily living in their functional living environment.    Time 11    Period Weeks    Status On-going            Plan - 01/20/21 1521    Clinical Impression Statement Pt is a 80 yo female with hx of encephalopathy 2/2 to seizures. Hx of intubation and ventilator use. Dysphagia. Dysarthria. Cognitive deficit. Pt was given the SLUMS which indicated a deficit in attention, short term memory, problem solving, and executive functioning skills. Pt husband is responsible for all ADLs due to significant memory deficits. Pt reports she would like to be more  self-sufficient with medications and her thinking skills. Previous documentation and MBS rec dysphagia 2 diet and honey thick liquids. SLP to address "minced and moist (IDDSI)" terminology with patient and spouse. Oropharyngeal dysphagia to be addressed in treatment. Dysarthria noted in conversation with impaired articulation. Gurgly vocal quality noted. "  Silent aspiration" addressed with patient and spouse. SLP rec skilled speech services to address cognitive-linguistic deficit, dysphagia, and dysarthria treatments to increase quality of life and participation.    Speech Therapy Frequency 2x / week    Duration 12 weeks    Treatment/Interventions Aspiration precaution training;Trials of upgraded texture/liquids;Compensatory strategies;Pharyngeal strengthening exercises;Oral motor exercises;Cueing hierarchy;Functional tasks;Patient/family education;Diet toleration management by SLP;Environmental controls;Cognitive reorganization;Compensatory techniques;Internal/external aids;SLP instruction and feedback    Potential to Achieve Goals Fair    Potential Considerations Ability to learn/carryover information    Consulted and Agree with Plan of Care Patient;Family member/caregiver    Family Member Consulted [name] (spouse)           Patient will benefit from skilled therapeutic intervention in order to improve the following deficits and impairments:   Cognitive communication deficit  Dysphagia, oropharyngeal phase    Problem List Patient Active Problem List   Diagnosis Date Noted  . PAF (paroxysmal atrial fibrillation) ([employer]) 12/30/2020  . Heme + stool 12/27/2020  . Hyponatremia 12/27/2020  . Anemia 12/27/2020  . Protein-calorie malnutrition, severe 12/16/2020  . Encephalopathy 12/10/2020  . Grand mal seizure ([place]) 12/02/2020  . COPD (chronic obstructive pulmonary disease) ([place]) 12/02/2020  . Malnutrition of moderate degree 11/28/2020  . Acute encephalopathy   . Acute respiratory failure with  hypoxia ([place])   . Aspiration into airway   . Cardiac arrest ([place])   . Groin mass 08/20/2020  . Falls infrequently 03/27/2019  . Sensory neuropathy 02/01/2019  . 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 Cerebellar ataxia ([facility] [place]) 08/23/2018  . Sensory polyneuropathy 08/23/2018  . Incontinence of feces   . Visual disturbances 02/06/2017  . Essential hypertension 11/07/2016  . Hyperlipidemia 04/24/2015  . Dysuria 02/11/2015  . Acute cystitis without hematuria 02/11/2015  . Chest pain 02/06/2015  . Left inguinal hernia 08/02/2013  . Gait abnormality 05/14/2013  . Gait disorder   . Nystagmus, end-position   . Routine health maintenance 12/13/2011  . LEG CRAMPS, NOCTURNAL 12/07/2010  . CALLUS, TOE 03/19/2010  . hip pain 03/19/2010  . ATAXIA 11/17/2008  . CERVICAL RADICULOPATHY, RIGHT 10/21/2008  . DIVERTICULOSIS OF COLON 08/14/2008  . RENAL CYST 08/14/2008  . ANEMIA-IRON DEFICIENCY 06/16/2007  . Asthma 06/16/2007  . GERD 06/16/2007  . OSTEOPENIA 06/16/2007    Verdene Lennert MS, [place], CBIS  01/20/2021, 3:29 PM  [facility]. [place], [place], [address] Phone: [phone]   Fax:  [phone]   Name: Kristine Francis MRN: [id number] Date of Birth: [birth year]

## 2021-01-20 NOTE — Therapy (Signed)
[facility]. [place], [place], [address] Phone: [phone]   Fax:  [phone]  Occupational Therapy Treatment  Patient Details  Name: Kristine Francis MRN: [id number] Date of Birth: 06/27/1941 Referring Provider (OT): Lauraine Rinne, PA-C   Encounter Date: 01/20/2021   OT End of Session - 01/20/21 1719    Visit Number 5    Number of Visits 13    Date for OT Re-Evaluation 02/22/21    Authorization Type Medicare    Progress Note Due on Visit 10    OT Start Time 1401    OT Stop Time 1445    OT Time Calculation (min) 44 min    Activity Tolerance Patient tolerated treatment well    Behavior During Therapy [facility] for tasks assessed/performed           Past Medical History:  Diagnosis Date  . Allergy   . Asthma    sees Dr. Tiajuana Amass - as child per pt  . At high risk for falls    unstable gait   . Ataxia   . Cataract    removed bilaterally   . Diverticulosis   . Emphysema   . Gait abnormality 05/14/2013   Ms.Stegmaier a patient of Dr. Linda Hedges presented first interpreter thousand 13 with a gait dysfunction, she also had an episodic confusion and Loss device. Exam found a mild nystagmus and she was referred to ophthalmology on 03-2812, brain MRI was normal nystagmus was a secondary diagnosis to vestibulitis, ear nose and throat has followed and had seen the patient. At the time of the nystagmus the patie  . GERD (gastroesophageal reflux disease)   . Hepatic steatosis   . Hyperlipidemia    on atorvastatin   . Hypertension    on losartan   . Iron deficiency anemia, unspecified   . Kidney cysts 11/16/12   small cyst on left  . Nystagmus, end-position   . Osteopenia   . Renal cyst   . Stroke ([place]) 01/2017   mild     Past Surgical History:  Procedure Laterality Date  . ANAL RECTAL MANOMETRY N/A 12/20/2017   Procedure: ANO RECTAL MANOMETRY;  Surgeon: Mauri Pole, MD;  Location: [facility];  Service:  Endoscopy;  Laterality: N/A;  . BREAST BIOPSY Bilateral 1970's  . CATARACT EXTRACTION Bilateral LT:02/14/13,RT:02/21/13  . CERVICAL SPINE SURGERY  2010  . COLONOSCOPY  03-12-14   per Dr. Olevia Perches, diverticulosis only, repeat 5 yrs   . EYE SURGERY Bilateral 01/2013&02/2013   left then right  . HERNIA REPAIR  Oct '14-left, remote-right   years ago right; left done '14  . SKIN CANCER EXCISION  05/13/13   FACE, basal cell  . TONSILLECTOMY      There were no vitals filed for this visit.   Subjective Assessment - 01/20/21 1716    Subjective  Pt reports she has been doing well, but that she does not remember not feeling well at previous visit.    Patient is accompanied by: Family member   Kristine Francis (husband)   Pertinent History CVA (March 2018), ataxia, COPD, osteopenia, neuropathy, gait abnormality, asthma, iron deficiency anemia, GERD, and hx nystagmus    Patient Stated Goals Get back to being independent; play tennis again    Currently in Pain? No/denies            OT Treatments/Exercises (OP) - 01/20/21 1727      ADLs   UB Dressing SPV donning overhead shirt and  button-up shirt, requiring assist pulling down shirt in the back; Mod I w/ doffing   Completed sitting in w/c   LB Dressing SPV w/ donning/doffing pants for safety due to increased fall risk      Shoulder Exercises: Seated   Row Strengthening;Both;10 reps;Weights   Chest press; 2 sets of 10 reps   Row Weight (lbs) 1 lb dowel rod    Flexion Strengthening;Both;10 reps;Weights   Overhead press; 2 sets of 10 reps   Flexion Weight (lbs) 1 lb dowel rod      Elbow Exercises   Bar Weights/Barbell (Elbow Flexion) 1 lb   Bicep curls w/ 1 lb dowel rod; 2 sets of 15 reps     Wrist Exercises   Other wrist exercises Bilateral AROM of wrist extension strengthening; completed 1 set of 25 reps individually            OT Education - 01/20/21 1734    Education Details OT introduced exercises to be included in HEP designed for BUE strength  and coordination; handout provided    Person(s) Educated Patient;Spouse    Methods Explanation;Handout;Demonstration    Comprehension Verbalized understanding;Returned demonstration            OT Short Term Goals - 01/14/21 0818      OT SHORT TERM GOAL #1   Title Pt will be able to complete initial HEP with Min A from caregiver    Baseline No current HEP    Time 3    Period Weeks    Status On-going    Target Date 01/26/21      OT SHORT TERM GOAL #2   Title Pt will be able to complete asymmetrical bilateral coordination activities independently in at least 3/4 trials to improve functional UE use during ADLs.    Baseline Decreased BUE GM coordination    Time 3    Period Weeks    Status On-going             OT Long Term Goals - 01/14/21 0819      OT LONG TERM GOAL #1   Title Pt will complete HEP designed for BUE strength and coordination with Mod I and report carryover to home    Baseline No current HEP    Time 6    Period Weeks    Status On-going      OT LONG TERM GOAL #2   Title Pt will demonstrate preparing a simple meal safely with Min A    Baseline Currently unable to prepare meals safely    Time 6    Period Weeks    Status On-going      OT LONG TERM GOAL #3   Title Pt will be able to safely complete UB/LB dressing with Mod I at least 75% of the time    Baseline Currently SPV/SBA for dressing    Time 6    Period Weeks    Status On-going      OT LONG TERM GOAL #4   Title Pt will be able to manipulate clothing fasteners without assistance in at least 4/5 trials    Baseline Decreased [place] as evidenced by time on 9-Hole Peg Test    Time 6    Period Weeks    Status On-going      OT LONG TERM GOAL #5   Title Pt will verbalize understanding of at least 3 compensatory/safety strategies to improve safety with ADLs and functional recall    Baseline Decreased safety awareness  and recall; no current compensatory strategies for ADLs    Time 6    Period Weeks     Status On-going            Plan - 01/20/21 1719    Clinical Impression Statement Pt continues to demonstrate decreased short-term and working memory, which impacts functional activities. Due to pt's report of not requiring assistance w/ ADLs at home, OT asked pt to demonstrated UB dressing w/ overhead shirt, open-front shirt, and pull-on pants; pt required assist pulling down her shirt in the back during both trials, which likely was impacted by attempting to don a shirt over another shirt.    OT Occupational Profile and History Detailed Assessment- Review of Records and additional review of physical, cognitive, psychosocial history related to current functional performance    Occupational performance deficits (Please refer to evaluation for details): ADL's;IADL's;Leisure    Body Structure / Function / Physical Skills ADL;Decreased knowledge of precautions;ROM;UE functional use;FMC;Dexterity;Body mechanics;Decreased knowledge of use of DME;Balance;Vision;GMC;Pain;Strength;IADL;Coordination    Cognitive Skills Attention;Problem Solve;Safety Awareness    Rehab Potential Good    Clinical Decision Making Several treatment options, min-mod task modification necessary    Comorbidities Affecting Occupational Performance: Presence of comorbidities impacting occupational performance    Modification or Assistance to Complete Evaluation  Min-Moderate modification of tasks or assist with assess necessary to complete eval    OT Frequency 2x / week    OT Duration 6 weeks    OT Treatment/Interventions Self-care/ADL training;Therapeutic exercise;Visual/perceptual remediation/compensation;Aquatic Therapy;Moist Heat;Neuromuscular education;Patient/family education;Energy conservation;Therapeutic activities;Fluidtherapy;Cryotherapy;DME and/or AE instruction;Manual Therapy;Passive range of motion;Cognitive remediation/compensation    Plan Address IADLs and safety/fall prevention    Consulted and Agree with Plan of  Care Patient;Family member/caregiver    Family Member Consulted Husband (Richard)           Patient will benefit from skilled therapeutic intervention in order to improve the following deficits and impairments:   Body Structure / Function / Physical Skills: ADL,Decreased knowledge of precautions,ROM,UE functional use,FMC,Dexterity,Body mechanics,Decreased knowledge of use of DME,Balance,Vision,GMC,Pain,Strength,IADL,Coordination Cognitive Skills: Attention,Problem Solve,Safety Awareness     Visit Diagnosis: Ataxia  Muscle weakness (generalized)  Other lack of coordination  Attention and concentration deficit    Problem List Patient Active Problem List   Diagnosis Date Noted  . PAF (paroxysmal atrial fibrillation) ([place]) 12/30/2020  . Heme + stool 12/27/2020  . Hyponatremia 12/27/2020  . Anemia 12/27/2020  . Protein-calorie malnutrition, severe 12/16/2020  . Encephalopathy 12/10/2020  . Grand mal seizure ([place]) 12/02/2020  . COPD (chronic obstructive pulmonary disease) ([place]) 12/02/2020  . Malnutrition of moderate degree 11/28/2020  . Acute encephalopathy   . Acute respiratory failure with hypoxia ([place])   . Aspiration into airway   . Cardiac arrest ([place])   . Groin mass 08/20/2020  . Falls infrequently 03/27/2019  . Sensory neuropathy 02/01/2019  . 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 Cerebellar ataxia ([place]) 08/23/2018  . Sensory polyneuropathy 08/23/2018  . Incontinence of feces   . Visual disturbances 02/06/2017  . Essential hypertension 11/07/2016  . Hyperlipidemia 04/24/2015  . Dysuria 02/11/2015  . Acute cystitis without hematuria 02/11/2015  . Chest pain 02/06/2015  . Left inguinal hernia 08/02/2013  . Gait abnormality 05/14/2013  . Gait disorder   . Nystagmus, end-position   . Routine health maintenance 12/13/2011  . LEG CRAMPS, NOCTURNAL 12/07/2010  . CALLUS, TOE 03/19/2010  . hip pain 03/19/2010  . ATAXIA 11/17/2008  . CERVICAL RADICULOPATHY, RIGHT  10/21/2008  . DIVERTICULOSIS OF COLON 08/14/2008  . RENAL  CYST 08/14/2008  . ANEMIA-IRON DEFICIENCY 06/16/2007  . Asthma 06/16/2007  . GERD 06/16/2007  . OSTEOPENIA 06/16/2007     Kathrine Cords, OTR/L, MSOT 01/20/2021, 5:35 PM  [facility]. [place], [place], [address] Phone: [phone]   Fax:  [phone]  Name: SILVANNA OHMER MRN: [id number] Date of Birth: 1941-09-15

## 2021-01-20 NOTE — Therapy (Signed)
[place]. [place], [place], [address] Phone: [phone]   Fax:  [phone]  Physical Therapy Treatment  Patient Details  Name: Kristine Francis MRN: [id number] Date of Birth: [birth year] Referring Provider (PT): Kristine Francis   Encounter Date: 01/20/2021   PT End of Session - 01/20/21 1550    Visit Number 5    Number of Visits 17    Date for PT Re-Evaluation 03/01/21    Authorization Type Medicare Primary; [employer] Secondary; 10th visit PN    PT Start Time 1533    PT Stop Time 1612    PT Time Calculation (min) 39 min    Equipment Utilized During Treatment Gait belt    Activity Tolerance Patient tolerated treatment well    Behavior During Therapy [facility] for tasks assessed/performed           Past Medical History:  Diagnosis Date  . Allergy   . Asthma    sees Dr. Tiajuana Francis - as child per pt  . At high risk for falls    unstable gait   . Ataxia   . Cataract    removed bilaterally   . Diverticulosis   . Emphysema   . Gait abnormality 05/14/2013   KristineFrancis a patient of Kristine Francis presented first interpreter thousand 13 with a gait dysfunction, she also had an episodic confusion and Loss device. Exam found a mild nystagmus and she was referred to ophthalmology on 03-2812, brain MRI was normal nystagmus was a secondary diagnosis to vestibulitis, ear nose and throat has followed and had seen the patient. At the time of the nystagmus the patie  . GERD (gastroesophageal reflux disease)   . Hepatic steatosis   . Hyperlipidemia    on atorvastatin   . Hypertension    on losartan   . Iron deficiency anemia, unspecified   . Kidney cysts 11/16/12   small cyst on left  . Nystagmus, end-position   . Osteopenia   . Renal cyst   . Stroke ([place]) 01/2017   mild     Past Surgical History:  Procedure Laterality Date  . ANAL RECTAL MANOMETRY N/A 12/20/2017   Procedure: ANO RECTAL MANOMETRY;  Surgeon:  Kristine Pole, MD;  Location: [facility];  Service: Endoscopy;  Laterality: N/A;  . BREAST BIOPSY Bilateral 1970's  . CATARACT EXTRACTION Bilateral LT:02/14/13,RT:02/21/13  . CERVICAL SPINE SURGERY  2010  . COLONOSCOPY  03-12-14   per Dr. Olevia Francis, diverticulosis only, repeat 5 yrs   . EYE SURGERY Bilateral 01/2013&02/2013   left then right  . HERNIA REPAIR  Oct '14-left, remote-right   years ago right; left done '14  . SKIN CANCER EXCISION  05/13/13   FACE, basal cell  . TONSILLECTOMY      There were no vitals filed for this visit.   Subjective Assessment - 01/20/21 1550    Subjective Pt husband reports no new changes this rx.    Patient is accompained by: Family member    Pertinent History HTN, renal cyst, osteopenia, nystagmus, kidney cysts, iron deficiency anemia, hepatic steatosis, gait abnormality    Patient Stated Goals To walk without falling and to walk more normally    Currently in Pain? No/denies  Ambulation/Gait     Gait Comments Short distances to seats, Nustep, functional distances in clinic with  with RW and gait belt; CGA-minA. Widened BOS, mild ataxia, RW helps to stabilize. Cues for sequencing esp with turning to sit          Knee/Hip Exercises: Aerobic    Nustep L3 x 6 minutes          Knee/Hip Exercises: Seated    Long Arc Quad Both;2 sets;10 reps     Long Arc Quad Weight 2 lbs.     Ball Squeeze 1x15 3 sec hold     Clamshell with [employer]ra[employer] Green     Marching Both;2 sets;10 reps     [employer] 2 lbs.     Hamstring Curl Both;2 sets;10 reps     Hamstring Limitations red tb     Sit to Stand    2 sets;10 reps;without UE support   holding ball to avoid using UEs       Standing balance/tolerance: Static standing  without UE support, CGA 1 minute x 3  Seated trunk extension x 10            PT Short Term Goals - 01/13/21 1511      PT SHORT TERM GOAL #1   Title Pt and caregiver/spouse independent  with initiating initial HEP    Baseline spouse reports difficult to get pt to maintain attention for HEP; educated on providing visual cuing    Time 2    Period Weeks    Status On-going    Target Date 01/18/21      PT SHORT TERM GOAL #2   Title Pt will improve five time sit to stand from mat, no UE support to </= 15 seconds but with improved anterior weight shift and decreased posterior LOB    Baseline able to perform with no UE support but does demo lack of anterior weight shift and occasional posterior LOB    Time 4    Period Weeks    Status On-going    Target Date 02/01/21      PT SHORT TERM GOAL #3   Title Pt will demo improved static standing balance to at least 1 minute with close supervision and minimal sway, without LOB    Baseline ~20 sec this rx    Time 4    Period Weeks    Status On-going    Target Date 02/01/21      PT SHORT TERM GOAL #4   Title Pt and caregiver will demo good understanding of falls prevention and safe functional mobility within the home to prevent falls    Time 2    Period Weeks    Status On-going    Target Date 01/18/21             PT Long Term Goals - 01/04/21 1640      PT LONG TERM GOAL #1   Title Pt will demonstrate independence with final HEP    Time 8    Period Weeks    Status New    Target Date 03/01/21      PT LONG TERM GOAL #2   Title Pt will improve five time sit to stand without use of UE to </= 13 seconds and no posterior LOB    Time 8    Period Weeks    Status New    Target Date 03/01/21      PT LONG TERM GOAL #3   Title Pt will  demo BLE strength at least 4+/5    Time 8    Period Weeks    Status New    Target Date 03/01/21      PT LONG TERM GOAL #4   Title Pt will improve BERG by at least 10  points to indicate decreased falls risk    Baseline 24/56    Time 8    Period Weeks    Status New    Target Date 03/01/21      PT LONG TERM GOAL #5   Title Pt will ambulate and complete direction changes with least  restrictive assistive device safely without LOB to facilitate improved safety with community negotiation    Time 8    Period Weeks    Status New    Target Date 03/01/21                 Plan - 01/20/21 1552    Clinical Impression Statement Kristine Francis tolerated all exercises well today.  No pain reported throughout today's session. Cuing for pacing and to maintain RW within BOS with ambulation; some inattention to L side. Cues for upright sitting with seated ex's; tendency to lean back as back/core fatigues. Increased reps of upright standing tolerance today with cues for posture.  Occasional posterior LOB with STS exercise. Limited carryover.    Personal Factors and Comorbidities Comorbidity 3+    Comorbidities HTN, renal cyst, osteopenia, nystagmus, kidney cysts, iron deficiency anemia, hepatic steatosis, gait abnormality.    Examination-Activity Limitations Locomotion Level;Transfers;Stairs;Toileting    Examination-Participation Restrictions Church;Community Activity;Cleaning    Rehab Potential Fair    PT Frequency 2x / week    PT Duration 8 weeks    PT Treatment/Interventions ADLs/Self Care Home Management;Aquatic Therapy;DME Instruction;Gait training;Stair training;Functional mobility training;Therapeutic activities;Therapeutic exercise;Balance training;Neuromuscular re-education;Patient/family education;Vestibular;Visual/perceptual remediation/compensation;Manual techniques;Taping    PT Next Visit Plan Assess HEP. Progressive TE and balance/neurore-ed exercises as tolerated. Frequent cues for safety. Standing balance/vestibular/multi-sensory balance activities, postural exercises, dynamic balance/ balance recovery.    Consulted and Agree with Plan of Care Patient;Family member/caregiver    Family Member Consulted husband           Patient will benefit from skilled therapeutic intervention in order to improve the following deficits and impairments:  Abnormal gait,Decreased  balance,Decreased coordination,Decreased strength,Difficulty walking,Impaired tone,Impaired UE functional use,Impaired vision/preception,Postural dysfunction,Decreased mobility,Decreased safety awareness,Decreased endurance  Visit Diagnosis: Muscle weakness (generalized)  Encephalopathy  Unsteadiness on feet  Ataxic gait  Grand mal seizure ([place])  Other abnormalities of gait and mobility     Problem List Patient Active Problem List   Diagnosis Date Noted  . PAF (paroxysmal atrial fibrillation) ([place]) 12/30/2020  . Heme + stool 12/27/2020  . Hyponatremia 12/27/2020  . Anemia 12/27/2020  . Protein-calorie malnutrition, severe 12/16/2020  . Encephalopathy 12/10/2020  . Grand mal seizure ([place]) 12/02/2020  . COPD (chronic obstructive pulmonary disease) ([place]) 12/02/2020  . Malnutrition of moderate degree 11/28/2020  . Acute encephalopathy   . Acute respiratory failure with hypoxia ([place])   . Aspiration into airway   . Cardiac arrest ([place])   . Groin mass 08/20/2020  . Falls infrequently 03/27/2019  . Sensory neuropathy 02/01/2019  . 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 Cerebellar ataxia ([place]) 08/23/2018  . Sensory polyneuropathy 08/23/2018  . Incontinence of feces   . Visual disturbances 02/06/2017  . Essential hypertension 11/07/2016  . Hyperlipidemia 04/24/2015  . Dysuria 02/11/2015  . Acute cystitis without hematuria 02/11/2015  . Chest pain 02/06/2015  . Left inguinal hernia 08/02/2013  .  Gait abnormality 05/14/2013  . Gait disorder   . Nystagmus, end-position   . Routine health maintenance 12/13/2011  . LEG CRAMPS, NOCTURNAL 12/07/2010  . CALLUS, TOE 03/19/2010  . hip pain 03/19/2010  . ATAXIA 11/17/2008  . CERVICAL RADICULOPATHY, RIGHT 10/21/2008  . DIVERTICULOSIS OF COLON 08/14/2008  . RENAL CYST 08/14/2008  . ANEMIA-IRON DEFICIENCY 06/16/2007  . Asthma 06/16/2007  . GERD 06/16/2007  . OSTEOPENIA 06/16/2007    Hall Busing, PT, DPT 01/20/2021, 5:57  PM  [facility]. [place], [place], [address] Phone: [phone]   Fax:  [phone]  Name: Kristine Francis MRN: [id number] Date of Birth: 08-08-1941

## 2021-01-20 NOTE — Therapy (Deleted)
[facility]. [place], [place], [address] Phone: [phone]   Fax:  [phone]  Occupational Therapy Treatment  Patient Details  Name: Kristine Francis MRN: [id number] Date of Birth: 06/27/1941 Referring Provider (OT): Lauraine Rinne, PA-C   Encounter Date: 01/20/2021   OT End of Session - 01/20/21 1719    Visit Number 5    Number of Visits 13    Date for OT Re-Evaluation 02/22/21    Authorization Type Medicare    Progress Note Due on Visit 10    OT Start Time 1401    OT Stop Time 1445    OT Time Calculation (min) 44 min    Activity Tolerance Patient tolerated treatment well    Behavior During Therapy [facility] for tasks assessed/performed           Past Medical History:  Diagnosis Date  . Allergy   . Asthma    sees Dr. Tiajuana Amass - as child per pt  . At high risk for falls    unstable gait   . Ataxia   . Cataract    removed bilaterally   . Diverticulosis   . Emphysema   . Gait abnormality 05/14/2013   Ms.Stegmaier a patient of Dr. Linda Hedges presented first interpreter thousand 13 with a gait dysfunction, she also had an episodic confusion and Loss device. Exam found a mild nystagmus and she was referred to ophthalmology on 03-2812, brain MRI was normal nystagmus was a secondary diagnosis to vestibulitis, ear nose and throat has followed and had seen the patient. At the time of the nystagmus the patie  . GERD (gastroesophageal reflux disease)   . Hepatic steatosis   . Hyperlipidemia    on atorvastatin   . Hypertension    on losartan   . Iron deficiency anemia, unspecified   . Kidney cysts 11/16/12   small cyst on left  . Nystagmus, end-position   . Osteopenia   . Renal cyst   . Stroke ([place]) 01/2017   mild     Past Surgical History:  Procedure Laterality Date  . ANAL RECTAL MANOMETRY N/A 12/20/2017   Procedure: ANO RECTAL MANOMETRY;  Surgeon: Mauri Pole, MD;  Location: [facility];  Service:  Endoscopy;  Laterality: N/A;  . BREAST BIOPSY Bilateral 1970's  . CATARACT EXTRACTION Bilateral LT:02/14/13,RT:02/21/13  . CERVICAL SPINE SURGERY  2010  . COLONOSCOPY  03-12-14   per Dr. Olevia Perches, diverticulosis only, repeat 5 yrs   . EYE SURGERY Bilateral 01/2013&02/2013   left then right  . HERNIA REPAIR  Oct '14-left, remote-right   years ago right; left done '14  . SKIN CANCER EXCISION  05/13/13   FACE, basal cell  . TONSILLECTOMY      There were no vitals filed for this visit.   Subjective Assessment - 01/20/21 1716    Subjective  Pt reports she has been doing well, but that she does not remember not feeling well at previous visit.    Patient is accompanied by: Family member   [name] (husband)   Pertinent History CVA (March 2018), ataxia, COPD, osteopenia, neuropathy, gait abnormality, asthma, iron deficiency anemia, GERD, and hx nystagmus    Patient Stated Goals Get back to being independent; play tennis again    Currently in Pain? No/denies            OT Treatments/Exercises (OP) - 01/20/21 1727      ADLs   UB Dressing SPV donning overhead shirt and  button-up shirt, requiring assist pulling down shirt in the back; Mod I w/ doffing   Completed sitting in w/c   LB Dressing SPV w/ donning/doffing pants for safety due to increased fall risk      Shoulder Exercises: Seated   Row Strengthening;Both;10 reps;Weights   Chest press; 2 sets of 10 reps   Row Weight (lbs) 1 lb dowel rod    Flexion Strengthening;Both;10 reps;Weights   Overhead press; 2 sets of 10 reps   Flexion Weight (lbs) 1 lb dowel rod      Elbow Exercises   Bar Weights/Barbell (Elbow Flexion) 1 lb   Bicep curls w/ 1 lb dowel rod; 2 sets of 15 reps     Wrist Exercises   Other wrist exercises Bilateral AROM of wrist extension strengthening; completed 1 set of 25 reps individually             OT Short Term Goals - 01/14/21 0818      OT SHORT TERM GOAL #1   Title Pt will be able to complete initial HEP with Min  A from caregiver    Baseline No current HEP    Time 3    Period Weeks    Status On-going    Target Date 01/26/21      OT SHORT TERM GOAL #2   Title Pt will be able to complete asymmetrical bilateral coordination activities independently in at least 3/4 trials to improve functional UE use during ADLs.    Baseline Decreased BUE GM coordination    Time 3    Period Weeks    Status On-going             OT Long Term Goals - 01/14/21 0819      OT LONG TERM GOAL #1   Title Pt will complete HEP designed for BUE strength and coordination with Mod I and report carryover to home    Baseline No current HEP    Time 6    Period Weeks    Status On-going      OT LONG TERM GOAL #2   Title Pt will demonstrate preparing a simple meal safely with Min A    Baseline Currently unable to prepare meals safely    Time 6    Period Weeks    Status On-going      OT LONG TERM GOAL #3   Title Pt will be able to safely complete UB/LB dressing with Mod I at least 75% of the time    Baseline Currently SPV/SBA for dressing    Time 6    Period Weeks    Status On-going      OT LONG TERM GOAL #4   Title Pt will be able to manipulate clothing fasteners without assistance in at least 4/5 trials    Baseline Decreased [place] as evidenced by time on 9-Hole Peg Test    Time 6    Period Weeks    Status On-going      OT LONG TERM GOAL #5   Title Pt will verbalize understanding of at least 3 compensatory/safety strategies to improve safety with ADLs and functional recall    Baseline Decreased safety awareness and recall; no current compensatory strategies for ADLs    Time 6    Period Weeks    Status On-going            Plan - 01/20/21 1719    Clinical Impression Statement Pt continues to demonstrate decreased short-term and working memory, which  impacts functional activities. Due to pt's report of not requiring assistance w/ ADLs at home, OT asked pt to demonstrated UB dressing w/ overhead shirt,  open-front shirt, and pull-on pants; pt required assist pulling down her shirt in the back during both trials, which likely was impacted by attempting to don a shirt over another shirt.    OT Occupational Profile and History Detailed Assessment- Review of Records and additional review of physical, cognitive, psychosocial history related to current functional performance    Occupational performance deficits (Please refer to evaluation for details): ADL's;IADL's;Leisure    Body Structure / Function / Physical Skills ADL;Decreased knowledge of precautions;ROM;UE functional use;FMC;Dexterity;Body mechanics;Decreased knowledge of use of DME;Balance;Vision;GMC;Pain;Strength;IADL;Coordination    Cognitive Skills Attention;Problem Solve;Safety Awareness    Rehab Potential Good    Clinical Decision Making Several treatment options, min-mod task modification necessary    Comorbidities Affecting Occupational Performance: Presence of comorbidities impacting occupational performance    Modification or Assistance to Complete Evaluation  Min-Moderate modification of tasks or assist with assess necessary to complete eval    OT Frequency 2x / week    OT Duration 6 weeks    OT Treatment/Interventions Self-care/ADL training;Therapeutic exercise;Visual/perceptual remediation/compensation;Aquatic Therapy;Moist Heat;Neuromuscular education;Patient/family education;Energy conservation;Therapeutic activities;Fluidtherapy;Cryotherapy;DME and/or AE instruction;Manual Therapy;Passive range of motion;Cognitive remediation/compensation    Plan Address IADLs and safety/fall prevention    Consulted and Agree with Plan of Care Patient;Family member/caregiver    Family Member Consulted Husband (Richard)           Patient will benefit from skilled therapeutic intervention in order to improve the following deficits and impairments:   Body Structure / Function / Physical Skills: ADL,Decreased knowledge of precautions,ROM,UE  functional use,FMC,Dexterity,Body mechanics,Decreased knowledge of use of DME,Balance,Vision,GMC,Pain,Strength,IADL,Coordination Cognitive Skills: Attention,Problem Solve,Safety Awareness     Visit Diagnosis: Ataxia  Muscle weakness (generalized)  Other lack of coordination  Attention and concentration deficit    Problem List Patient Active Problem List   Diagnosis Date Noted  . PAF (paroxysmal atrial fibrillation) ([place]) 12/30/2020  . Heme + stool 12/27/2020  . Hyponatremia 12/27/2020  . Anemia 12/27/2020  . Protein-calorie malnutrition, severe 12/16/2020  . Encephalopathy 12/10/2020  . Grand mal seizure ([place]) 12/02/2020  . COPD (chronic obstructive pulmonary disease) ([place]) 12/02/2020  . Malnutrition of moderate degree 11/28/2020  . Acute encephalopathy   . Acute respiratory failure with hypoxia ([place])   . Aspiration into airway   . Cardiac arrest ([place])   . Groin mass 08/20/2020  . Falls infrequently 03/27/2019  . Sensory neuropathy 02/01/2019  . 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 Cerebellar ataxia ([place]) 08/23/2018  . Sensory polyneuropathy 08/23/2018  . Incontinence of feces   . Visual disturbances 02/06/2017  . Essential hypertension 11/07/2016  . Hyperlipidemia 04/24/2015  . Dysuria 02/11/2015  . Acute cystitis without hematuria 02/11/2015  . Chest pain 02/06/2015  . Left inguinal hernia 08/02/2013  . Gait abnormality 05/14/2013  . Gait disorder   . Nystagmus, end-position   . Routine health maintenance 12/13/2011  . LEG CRAMPS, NOCTURNAL 12/07/2010  . CALLUS, TOE 03/19/2010  . hip pain 03/19/2010  . ATAXIA 11/17/2008  . CERVICAL RADICULOPATHY, RIGHT 10/21/2008  . DIVERTICULOSIS OF COLON 08/14/2008  . RENAL CYST 08/14/2008  . ANEMIA-IRON DEFICIENCY 06/16/2007  . Asthma 06/16/2007  . GERD 06/16/2007  . OSTEOPENIA 06/16/2007     Kathrine Cords, OTR/L, MSOT 01/20/2021, 5:33 PM  [place]. [place], [place], [address] Phone: [phone]   Fax:  [phone]  Name: ZAHRAH SUTHERLIN MRN: [id number] Date of Birth: September 10, 1941

## 2021-01-25 ENCOUNTER — Ambulatory Visit: Payer: Medicare Other

## 2021-01-25 ENCOUNTER — Other Ambulatory Visit: Payer: Self-pay

## 2021-01-25 ENCOUNTER — Ambulatory Visit: Payer: Medicare Other | Admitting: Speech Pathology

## 2021-01-25 ENCOUNTER — Ambulatory Visit: Payer: Medicare Other | Admitting: Occupational Therapy

## 2021-01-25 ENCOUNTER — Encounter: Payer: Self-pay | Admitting: Speech Pathology

## 2021-01-25 ENCOUNTER — Encounter: Payer: Self-pay | Admitting: Occupational Therapy

## 2021-01-25 DIAGNOSIS — G40409 Other generalized epilepsy and epileptic syndromes, not intractable, without status epilepticus: Secondary | ICD-10-CM | POA: Diagnosis not present

## 2021-01-25 DIAGNOSIS — R278 Other lack of coordination: Secondary | ICD-10-CM

## 2021-01-25 DIAGNOSIS — G9340 Encephalopathy, unspecified: Secondary | ICD-10-CM

## 2021-01-25 DIAGNOSIS — R260 Ataxic gait: Secondary | ICD-10-CM

## 2021-01-25 DIAGNOSIS — R2689 Other abnormalities of gait and mobility: Secondary | ICD-10-CM | POA: Diagnosis not present

## 2021-01-25 DIAGNOSIS — R2681 Unsteadiness on feet: Secondary | ICD-10-CM

## 2021-01-25 DIAGNOSIS — M6281 Muscle weakness (generalized): Secondary | ICD-10-CM

## 2021-01-25 DIAGNOSIS — R41841 Cognitive communication deficit: Secondary | ICD-10-CM

## 2021-01-25 DIAGNOSIS — R270 Ataxia, unspecified: Secondary | ICD-10-CM

## 2021-01-25 DIAGNOSIS — R41840 Attention and concentration deficit: Secondary | ICD-10-CM

## 2021-01-25 NOTE — Therapy (Signed)
[place] [place], [place], [address] Phone: [phone]   Fax:  [phone]  Speech Language Pathology Treatment  Patient Details  Name: Kristine Francis MRN: [id number] Date of Birth: 13-Mar-1941 No data recorded  Encounter Date: 01/25/2021    Past Medical History:  Diagnosis Date  . Allergy   . Asthma    sees Dr. Tiajuana Amass - as child per pt  . At high risk for falls    unstable gait   . Ataxia   . Cataract    removed bilaterally   . Diverticulosis   . Emphysema   . Gait abnormality 05/14/2013   Ms.Pfahler a patient of Dr. Linda Hedges presented first interpreter thousand 13 with a gait dysfunction, she also had an episodic confusion and Loss device. Exam found a mild nystagmus and she was referred to ophthalmology on 03-2812, brain MRI was normal nystagmus was a secondary diagnosis to vestibulitis, ear nose and throat has followed and had seen the patient. At the time of the nystagmus the patie  . GERD (gastroesophageal reflux disease)   . Hepatic steatosis   . Hyperlipidemia    on atorvastatin   . Hypertension    on losartan   . Iron deficiency anemia, unspecified   . Kidney cysts 11/16/12   small cyst on left  . Nystagmus, end-position   . Osteopenia   . Renal cyst   . Stroke ([place]) 01/2017   mild     Past Surgical History:  Procedure Laterality Date  . ANAL RECTAL MANOMETRY N/A 12/20/2017   Procedure: ANO RECTAL MANOMETRY;  Surgeon: Mauri Pole, MD;  Location: [facility];  Service: Endoscopy;  Laterality: N/A;  . BREAST BIOPSY Bilateral 1970's  . CATARACT EXTRACTION Bilateral LT:02/14/13,RT:02/21/13  . CERVICAL SPINE SURGERY  2010  . COLONOSCOPY  03-12-14   per Dr. Olevia Perches, diverticulosis only, repeat 5 yrs   . EYE SURGERY Bilateral 01/2013&02/2013   left then right  . HERNIA REPAIR  Oct '14-left, remote-right   years ago right; left done '14  . SKIN CANCER EXCISION  05/13/13   FACE, basal cell   . TONSILLECTOMY      There were no vitals filed for this visit.   Subjective Assessment - 01/25/21 1409    Subjective "I had a great weekend."    Patient is accompained by: Family member    Currently in Pain? No/denies                 ADULT SLP TREATMENT - 01/25/21 1436      General Information   Behavior/Cognition Alert;Cooperative;Pleasant mood;Requires cueing      Treatment Provided   Treatment provided Cognitive-Linquistic      Cognitive-Linquistic Treatment   Treatment focused on Cognition    Skilled Treatment Treatment focused on preparing information for memory book and divergent naming of personally-relevant information. Phonetic cues were helpful. To bring memory book next session.      Assessment / Recommendations / Plan   Plan Continue with current plan of care      Progression Toward Goals   Progression toward goals Progressing toward goals              SLP Short Term Goals - 01/25/21 1439      SLP SHORT TERM GOAL #1   Title Patient will tell SLP 3 areas of deficit/challenge for her given 2 verbal cues.    Time 4    Period Weeks  Status On-going      SLP SHORT TERM GOAL #2   Title Patient will recall personally-relevant information post >5 minute delay.    Baseline <5 min    Time 4    Period Weeks    Status On-going      SLP SHORT TERM GOAL #3   Title The patient will complete swallowing maneuvers (supraglottic swallow, Mendelson maneuver, effortful swallow, etc.) to improve oral motor weakness, tongue base retraction, hyolaryngeal excursion, airway protection, and clearance of the bolus through the pharynx with moderate verbal, visual and tactile cues.    Time 4    Period Weeks    Status On-going            SLP Long Term Goals - 01/25/21 1439      SLP LONG TERM GOAL #1   Title Client will utilize compensatory strategies with optimum safety and efficiency of swallowing function of P.O. intake without overt signs and symptoms of  aspiration for the highest appropriate diet level.    Baseline honey thick/dysphagia 2/minced and moist    Time 10    Period Weeks    Status On-going      SLP LONG TERM GOAL #2   Title Patient will demonstrate use of memory strategies to schedule activities, recall weekly events and items to maintain safety to participate socially in functional living environment    Time 10    Period Weeks    Status On-going      SLP LONG TERM GOAL #3   Title Patient will demonstrate use of information processing and self monitoring during daily living activities to improve safety and awareness in functional living environment    Time 10    Period Weeks    Status On-going      SLP LONG TERM GOAL #4   Title Patient will develop functional attention skills to effectively attend to and communicate in tasks of daily living in their functional living environment.    Time 10    Period Weeks    Status On-going            Plan - 01/25/21 1438    Clinical Impression Statement Pt is a 80 yo female with hx of encephalopathy 2/2 to seizures. Hx of intubation and ventilator use. Dysphagia. Dysarthria. Cognitive deficit. Pt was given the SLUMS which indicated a deficit in attention, short term memory, problem solving, and executive functioning skills. Pt husband is responsible for all ADLs due to significant memory deficits. Pt reports she would like to be more self-sufficient with medications and her thinking skills. Previous documentation and MBS rec dysphagia 2 diet and honey thick liquids. SLP to address "minced and moist (IDDSI)" terminology with patient and spouse. Oropharyngeal dysphagia to be addressed in treatment. Dysarthria noted in conversation with impaired articulation. Gurgly vocal quality noted. "Silent aspiration" addressed with patient and spouse. SLP rec skilled speech services to address cognitive-linguistic deficit, dysphagia, and dysarthria treatments to increase quality of life and  participation.    Speech Therapy Frequency 2x / week    Duration 12 weeks    Treatment/Interventions Aspiration precaution training;Trials of upgraded texture/liquids;Compensatory strategies;Pharyngeal strengthening exercises;Oral motor exercises;Cueing hierarchy;Functional tasks;Patient/family education;Diet toleration management by SLP;Environmental controls;Cognitive reorganization;Compensatory techniques;Internal/external aids;SLP instruction and feedback    Potential to Achieve Goals Fair    Potential Considerations Ability to learn/carryover information    Consulted and Agree with Plan of Care Patient;Family member/caregiver    Family Member Consulted [name] (spouse)  Patient will benefit from skilled therapeutic intervention in order to improve the following deficits and impairments:   Cognitive communication deficit    Problem List Patient Active Problem List   Diagnosis Date Noted  . PAF (paroxysmal atrial fibrillation) ([facility]) 12/30/2020  . Heme + stool 12/27/2020  . Hyponatremia 12/27/2020  . Anemia 12/27/2020  . Protein-calorie malnutrition, severe 12/16/2020  . Encephalopathy 12/10/2020  . Grand mal seizure ([facility]) 12/02/2020  . COPD (chronic obstructive pulmonary disease) ([facility]) 12/02/2020  . Malnutrition of moderate degree 11/28/2020  . Acute encephalopathy   . Acute respiratory failure with hypoxia ([facility])   . Aspiration into airway   . Cardiac arrest ([facility])   . Groin mass 08/20/2020  . Falls infrequently 03/27/2019  . Sensory neuropathy 02/01/2019  . 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 Cerebellar ataxia ([facility]) 08/23/2018  . Sensory polyneuropathy 08/23/2018  . Incontinence of feces   . Visual disturbances 02/06/2017  . Essential hypertension 11/07/2016  . Hyperlipidemia 04/24/2015  . Dysuria 02/11/2015  . Acute cystitis without hematuria 02/11/2015  . Chest pain 02/06/2015  . Left inguinal hernia 08/02/2013  . Gait abnormality 05/14/2013  . Gait  disorder   . Nystagmus, end-position   . Routine health maintenance 12/13/2011  . LEG CRAMPS, NOCTURNAL 12/07/2010  . CALLUS, TOE 03/19/2010  . hip pain 03/19/2010  . ATAXIA 11/17/2008  . CERVICAL RADICULOPATHY, RIGHT 10/21/2008  . DIVERTICULOSIS OF COLON 08/14/2008  . RENAL CYST 08/14/2008  . ANEMIA-IRON DEFICIENCY 06/16/2007  . Asthma 06/16/2007  . GERD 06/16/2007  . OSTEOPENIA 06/16/2007    Verdene Lennert MS, [place], CBIS  01/25/2021, 2:41 PM  [place]. [place], [place], [address] Phone: [phone]   Fax:  [phone]   Name: Kristine Francis MRN: [id number] Date of Birth: 1941-10-13

## 2021-01-25 NOTE — Therapy (Signed)
[place]. [place], [place], [address] Phone: [phone]   Fax:  [phone]  Physical Therapy Treatment  Patient Details  Name: Kristine Francis MRN: [id number] Date of Birth: 11/28/1940 Referring Provider (PT): Cathlyn Parsons, PA-C   Encounter Date: 01/25/2021   PT End of Session - 01/25/21 1543    Visit Number 6    Number of Visits 17    Date for PT Re-Evaluation 03/01/21    Authorization Type Medicare Primary; [employer] Secondary; 10th visit PN    PT Start Time 1532    PT Stop Time 1615    PT Time Calculation (min) 43 min    Equipment Utilized During Treatment Gait belt    Activity Tolerance Patient tolerated treatment well    Behavior During Therapy [facility] for tasks assessed/performed           Past Medical History:  Diagnosis Date   Allergy    Asthma    sees Dr. Tiajuana Amass - as child per pt   At high risk for falls    unstable gait    Ataxia    Cataract    removed bilaterally    Diverticulosis    Emphysema    Gait abnormality 05/14/2013   [name] a patient of Dr. Linda Hedges presented first interpreter thousand 13 with a gait dysfunction, she also had an episodic confusion and Loss device. Exam found a mild nystagmus and she was referred to ophthalmology on 03-2812, brain MRI was normal nystagmus was a secondary diagnosis to vestibulitis, ear nose and throat has followed and had seen the patient. At the time of the nystagmus the patie   GERD (gastroesophageal reflux disease)    Hepatic steatosis    Hyperlipidemia    on atorvastatin    Hypertension    on losartan    Iron deficiency anemia, unspecified    Kidney cysts 11/16/12   small cyst on left   Nystagmus, end-position    Osteopenia    Renal cyst    Stroke ([place]) 01/2017   mild     Past Surgical History:  Procedure Laterality Date   ANAL RECTAL MANOMETRY N/A 12/20/2017   Procedure: ANO RECTAL MANOMETRY;  Surgeon:  Mauri Pole, MD;  Location: [facility];  Service: Endoscopy;  Laterality: N/A;   BREAST BIOPSY Bilateral 1970's   CATARACT EXTRACTION Bilateral LT:02/14/13,RT:02/21/13   CERVICAL SPINE SURGERY  2010   COLONOSCOPY  03-12-14   per Dr. Olevia Perches, diverticulosis only, repeat 5 yrs    EYE SURGERY Bilateral 01/2013&02/2013   left then right   HERNIA REPAIR  Oct '14-left, remote-right   years ago right; left done '14   SKIN CANCER EXCISION  05/13/13   FACE, basal cell   TONSILLECTOMY      There were no vitals filed for this visit.   Subjective Assessment - 01/25/21 1542    Subjective Pt and husband report she fell on her bottom the other day in the kitchen when pushing the walker out to far in front of her. Otherwise no new changes.    Patient is accompained by: Family member    Pertinent History HTN, renal cyst, osteopenia, nystagmus, kidney cysts, iron deficiency anemia, hepatic steatosis, gait abnormality    Limitations Walking;Standing    Patient Stated Goals To walk without falling and to walk more normally    Currently in Pain? No/denies  [facility] Adult PT Treatment/Exercise - 01/25/21 1546      Transfers   Sit to Stand 4: Min guard   from mat table, gait bel donned, min guarding and tactile cues for midline and upright trunk for balance.     Ambulation/Gait   Ambulation/Gait Assistance Details CGA with cues for pacing with RW    Ambulation Distance (Feet) 300 Feet   with turns   Gait Comments gait with RW; CGA-minA. Widened BOS, mild ataxia, RW helps to stabilize. Cues for sequencing esp with turning to sit      High Level Balance   High Level Balance Activities Side stepping;Backward walking    High Level Balance Comments Standing balance with ball tosses 10 x 2 with CGA to min A for balance      Knee/Hip Exercises: Aerobic   Nustep L4 x 7 minutes      Knee/Hip Exercises: Seated   Other Seated Knee/Hip Exercises Seated trunk rotation  x10 B  wit red pball to tap bones R/L              PT Short Term Goals - 01/13/21 1511      PT SHORT TERM GOAL #1   Title Pt and caregiver/spouse independent with initiating initial HEP    Baseline spouse reports difficult to get pt to maintain attention for HEP; educated on providing visual cuing    Time 2    Period Weeks    Status On-going    Target Date 01/18/21      PT SHORT TERM GOAL #2   Title Pt will improve five time sit to stand from mat, no UE support to </= 15 seconds but with improved anterior weight shift and decreased posterior LOB    Baseline able to perform with no UE support but does demo lack of anterior weight shift and occasional posterior LOB    Time 4    Period Weeks    Status On-going    Target Date 02/01/21      PT SHORT TERM GOAL #3   Title Pt will demo improved static standing balance to at least 1 minute with close supervision and minimal sway, without LOB    Baseline ~20 sec this rx    Time 4    Period Weeks    Status On-going    Target Date 02/01/21      PT SHORT TERM GOAL #4   Title Pt and caregiver will demo good understanding of falls prevention and safe functional mobility within the home to prevent falls    Time 2    Period Weeks    Status On-going    Target Date 01/18/21             PT Long Term Goals - 01/04/21 1640      PT LONG TERM GOAL #1   Title Pt will demonstrate independence with final HEP    Time 8    Period Weeks    Status New    Target Date 03/01/21      PT LONG TERM GOAL #2   Title Pt will improve five time sit to stand without use of UE to </= 13 seconds and no posterior LOB    Time 8    Period Weeks    Status New    Target Date 03/01/21      PT LONG TERM GOAL #3   Title Pt will demo BLE strength at least 4+/5    Time 8  Period Weeks    Status New    Target Date 03/01/21      PT LONG TERM GOAL #4   Title Pt will improve BERG by at least 10  points to indicate decreased falls risk    Baseline 24/56     Time 8    Period Weeks    Status New    Target Date 03/01/21      PT LONG TERM GOAL #5   Title Pt will ambulate and complete direction changes with least restrictive assistive device safely without LOB to facilitate improved safety with community negotiation    Time 8    Period Weeks    Status New    Target Date 03/01/21                 Plan - 01/25/21 1544    Clinical Impression Statement Kristine Francis tolerated today's session nicely today. Continued cues for pacing with ambulation to maintain RW at appropriate distance and avoid pushing it away from her. Worked on static balance with upright posture - able to achieve but begins to slouch/crouch with fatigue.  Able to complete ball tosses in standing on firm ground today with guarding and moderate sway, no large LOB. Practiced forward, backward and sidestepping with no more than 1 UE support on parallel bars today with mod cues to encourage increased step length backward (initially taking left step smaller than right). She will benefit from continued balance, strength and endurance  training to improve safe upright mobility and prevent falls    Personal Factors and Comorbidities Comorbidity 3+    Comorbidities HTN, renal cyst, osteopenia, nystagmus, kidney cysts, iron deficiency anemia, hepatic steatosis, gait abnormality.    Examination-Activity Limitations Locomotion Level;Transfers;Stairs;Toileting    Examination-Participation Restrictions Church;Community Activity;Cleaning    Rehab Potential Fair    PT Frequency 2x / week    PT Duration 8 weeks    PT Treatment/Interventions ADLs/Self Care Home Management;Aquatic Therapy;DME Instruction;Gait training;Stair training;Functional mobility training;Therapeutic activities;Therapeutic exercise;Balance training;Neuromuscular re-education;Patient/family education;Vestibular;Visual/perceptual remediation/compensation;Manual techniques;Taping    PT Next Visit Plan Progressive TE and  balance/neurore-ed exercises as tolerated. Frequent cues for safety. Standing balance/vestibular/multi-sensory balance activities, postural exercises, dynamic balance/ balance recovery.    Consulted and Agree with Plan of Care Patient;Family member/caregiver    Family Member Consulted husband           Patient will benefit from skilled therapeutic intervention in order to improve the following deficits and impairments:  Abnormal gait,Decreased balance,Decreased coordination,Decreased strength,Difficulty walking,Impaired tone,Impaired UE functional use,Impaired vision/preception,Postural dysfunction,Decreased mobility,Decreased safety awareness,Decreased endurance  Visit Diagnosis: Unsteadiness on feet  Ataxic gait  Other abnormalities of gait and mobility  Muscle weakness (generalized)  Encephalopathy  Grand mal seizure ([place])  Other lack of coordination     Problem List Patient Active Problem List   Diagnosis Date Noted   PAF (paroxysmal atrial fibrillation) ([facility]) 12/30/2020   Heme + stool 12/27/2020   Hyponatremia 12/27/2020   Anemia 12/27/2020   Protein-calorie malnutrition, severe 12/16/2020   Encephalopathy 12/10/2020   Grand mal seizure ([facility]) 12/02/2020   COPD (chronic obstructive pulmonary disease) ([place]) 12/02/2020   Malnutrition of moderate degree 11/28/2020   Acute encephalopathy    Acute respiratory failure with hypoxia ([facility])    Aspiration into airway    Cardiac arrest ([place])    Groin mass 08/20/2020   Falls infrequently 03/27/2019   Sensory neuropathy 02/01/2019   Luetscher's syndrome 08/23/2018   Cerebellar ataxia ([facility] [place]) 08/23/2018   Sensory polyneuropathy 08/23/2018   Incontinence  of feces    Visual disturbances 02/06/2017   Essential hypertension 11/07/2016   Hyperlipidemia 04/24/2015   Dysuria 02/11/2015   Acute cystitis without hematuria 02/11/2015   Chest pain 02/06/2015   Left inguinal hernia 08/02/2013   Gait  abnormality 05/14/2013   Gait disorder    Nystagmus, end-position    Routine health maintenance 12/13/2011   LEG CRAMPS, NOCTURNAL 12/07/2010   CALLUS, TOE 03/19/2010   hip pain 03/19/2010   ATAXIA 11/17/2008   CERVICAL RADICULOPATHY, RIGHT 10/21/2008   DIVERTICULOSIS OF COLON 08/14/2008   RENAL CYST 08/14/2008   ANEMIA-IRON DEFICIENCY 06/16/2007   Asthma 06/16/2007   GERD 06/16/2007   OSTEOPENIA 06/16/2007    Hall Busing, PT, DPT 01/25/2021, 4:27 PM  [place]. [place], [place], [address] Phone: [phone]   Fax:  [phone]  Name: Kristine Francis MRN: [id number] Date of Birth: 1941-09-19

## 2021-01-25 NOTE — Therapy (Signed)
[facility] [place], [place], [address] Phone: [phone]   Fax:  [phone]  Occupational Therapy Treatment  Patient Details  Name: Kristine Francis MRN: [id number] Date of Birth: 11-21-1941 Referring Provider (OT): Lauraine Rinne, PA-C   Encounter Date: 01/25/2021   OT End of Session - 01/25/21 1648    Visit Number 6    Number of Visits 13    Date for OT Re-Evaluation 02/22/21    Authorization Type Medicare    Progress Note Due on Visit 10    OT Start Time 1447    OT Stop Time 1532    OT Time Calculation (min) 45 min    Activity Tolerance Patient tolerated treatment well    Behavior During Therapy [facility] for tasks assessed/performed           Past Medical History:  Diagnosis Date  . Allergy   . Asthma    sees Dr. Tiajuana Amass - as child per pt  . At high risk for falls    unstable gait   . Ataxia   . Cataract    removed bilaterally   . Diverticulosis   . Emphysema   . Gait abnormality 05/14/2013   Ms.Mondesir a patient of Dr. Linda Hedges presented first interpreter thousand 13 with a gait dysfunction, she also had an episodic confusion and Loss device. Exam found a mild nystagmus and she was referred to ophthalmology on 03-2812, brain MRI was normal nystagmus was a secondary diagnosis to vestibulitis, ear nose and throat has followed and had seen the patient. At the time of the nystagmus the patie  . GERD (gastroesophageal reflux disease)   . Hepatic steatosis   . Hyperlipidemia    on atorvastatin   . Hypertension    on losartan   . Iron deficiency anemia, unspecified   . Kidney cysts 11/16/12   small cyst on left  . Nystagmus, end-position   . Osteopenia   . Renal cyst   . Stroke ([place]) 01/2017   mild     Past Surgical History:  Procedure Laterality Date  . ANAL RECTAL MANOMETRY N/A 12/20/2017   Procedure: ANO RECTAL MANOMETRY;  Surgeon: Mauri Pole, MD;  Location: [facility];  Service:  Endoscopy;  Laterality: N/A;  . BREAST BIOPSY Bilateral 1970's  . CATARACT EXTRACTION Bilateral LT:02/14/13,RT:02/21/13  . CERVICAL SPINE SURGERY  2010  . COLONOSCOPY  03-12-14   per Dr. Olevia Perches, diverticulosis only, repeat 5 yrs   . EYE SURGERY Bilateral 01/2013&02/2013   left then right  . HERNIA REPAIR  Oct '14-left, remote-right   years ago right; left done '14  . SKIN CANCER EXCISION  05/13/13   FACE, basal cell  . TONSILLECTOMY      There were no vitals filed for this visit.   Subjective Assessment - 01/25/21 1449    Subjective  Pt stated, "it's very frustrating for me that I can't do the things I like to do"    Patient is accompanied by: Family member   [name] (husband)   Pertinent History CVA (March 2018), ataxia, COPD, osteopenia, neuropathy, gait abnormality, asthma, iron deficiency anemia, GERD, and hx nystagmus    Patient Stated Goals Get back to being independent; play tennis again    Currently in Pain? No/denies            OT Treatments/Exercises (OP) - 01/25/21 1656      ADLs   Home Maintenance Simulated vacuuming w/ 3# dowel rod while  seated in w/c due to pt's desire to return to vacuuming at home; OT provided coaching for functional w/c mobility and problem-solving for home carryover   Handout provided as visual cue to recall safety strategies during vacuuming; 3# dowel used to facilitate UE strengthening   ADL Education Given Yes    Compensatory Strategies OT introduced energy conservation strategies (planning, pacing, prioritizing, positioning) and discussed benefit of application during home maintenance tasks   Due to limitations w/ memory, will continue to review strategies in-session prior to administrating handout     Exercises   Exercises Theraputty      Theraputty   Theraputty - Grip Grip-and-pull exercise completed 10x w/ yellow putty; verbal cues required for attention to task   Putty not administered to pt           OT Education - 01/25/21 1641     Education Details Energy conservation strategies introduced and discussed w/ pt, including examples of how to incorporate them into daily activities.    Person(s) Educated Patient;Spouse    Methods Explanation    Comprehension Verbalized understanding;Need further instruction            OT Short Term Goals - 01/14/21 0818      OT SHORT TERM GOAL #1   Title Pt will be able to complete initial HEP with Min A from caregiver    Baseline No current HEP    Time 3    Period Weeks    Status On-going    Target Date 01/26/21      OT SHORT TERM GOAL #2   Title Pt will be able to complete asymmetrical bilateral coordination activities independently in at least 3/4 trials to improve functional UE use during ADLs.    Baseline Decreased BUE GM coordination    Time 3    Period Weeks    Status On-going            OT Long Term Goals - 01/14/21 0819      OT LONG TERM GOAL #1   Title Pt will complete HEP designed for BUE strength and coordination with Mod I and report carryover to home    Baseline No current HEP    Time 6    Period Weeks    Status On-going      OT LONG TERM GOAL #2   Title Pt will demonstrate preparing a simple meal safely with Min A    Baseline Currently unable to prepare meals safely    Time 6    Period Weeks    Status On-going      OT LONG TERM GOAL #3   Title Pt will be able to safely complete UB/LB dressing with Mod I at least 75% of the time    Baseline Currently SPV/SBA for dressing    Time 6    Period Weeks    Status On-going      OT LONG TERM GOAL #4   Title Pt will be able to manipulate clothing fasteners without assistance in at least 4/5 trials    Baseline Decreased [place] as evidenced by time on 9-Hole Peg Test    Time 6    Period Weeks    Status On-going      OT LONG TERM GOAL #5   Title Pt will verbalize understanding of at least 3 compensatory/safety strategies to improve safety with ADLs and functional recall    Baseline Decreased safety  awareness and recall; no current compensatory strategies for ADLs  Time 6    Period Weeks    Status On-going            Plan - 01/25/21 1649    Clinical Impression Statement Pt continues to demonstrate decreased strength, short-term/working memory, and intellectual awareness, which impact safety during functional activities. OT focused on BUE strength and simulation of safe participation in IADLs this session; handout provided to facilitate carryover to home.    OT Occupational Profile and History Detailed Assessment- Review of Records and additional review of physical, cognitive, psychosocial history related to current functional performance    Occupational performance deficits (Please refer to evaluation for details): ADL's;IADL's;Leisure    Body Structure / Function / Physical Skills ADL;Decreased knowledge of precautions;ROM;UE functional use;FMC;Dexterity;Body mechanics;Decreased knowledge of use of DME;Balance;Vision;GMC;Pain;Strength;IADL;Coordination    Cognitive Skills Attention;Problem Solve;Safety Awareness    Rehab Potential Good    Clinical Decision Making Several treatment options, min-mod task modification necessary    Comorbidities Affecting Occupational Performance: Presence of comorbidities impacting occupational performance    Modification or Assistance to Complete Evaluation  Min-Moderate modification of tasks or assist with assess necessary to complete eval    OT Frequency 2x / week    OT Duration 6 weeks    OT Treatment/Interventions Self-care/ADL training;Therapeutic exercise;Visual/perceptual remediation/compensation;Aquatic Therapy;Moist Heat;Neuromuscular education;Patient/family education;Energy conservation;Therapeutic activities;Fluidtherapy;Cryotherapy;DME and/or AE instruction;Manual Therapy;Passive range of motion;Cognitive remediation/compensation    Plan IADLs; safety/fall prevention; review energy conservation    Consulted and Agree with Plan of Care  Patient;Family member/caregiver    Family Member Consulted Husband (Richard)           Patient will benefit from skilled therapeutic intervention in order to improve the following deficits and impairments:   Body Structure / Function / Physical Skills: ADL,Decreased knowledge of precautions,ROM,UE functional use,FMC,Dexterity,Body mechanics,Decreased knowledge of use of DME,Balance,Vision,GMC,Pain,Strength,IADL,Coordination Cognitive Skills: Attention,Problem Solve,Safety Awareness     Visit Diagnosis: Ataxia  Muscle weakness (generalized)  Other lack of coordination  Attention and concentration deficit    Problem List Patient Active Problem List   Diagnosis Date Noted  . PAF (paroxysmal atrial fibrillation) ([place]) 12/30/2020  . Heme + stool 12/27/2020  . Hyponatremia 12/27/2020  . Anemia 12/27/2020  . Protein-calorie malnutrition, severe 12/16/2020  . Encephalopathy 12/10/2020  . Grand mal seizure ([place]) 12/02/2020  . COPD (chronic obstructive pulmonary disease) ([place]) 12/02/2020  . Malnutrition of moderate degree 11/28/2020  . Acute encephalopathy   . Acute respiratory failure with hypoxia ([place])   . Aspiration into airway   . Cardiac arrest ([place])   . Groin mass 08/20/2020  . Falls infrequently 03/27/2019  . Sensory neuropathy 02/01/2019  . 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 Cerebellar ataxia ([place]) 08/23/2018  . Sensory polyneuropathy 08/23/2018  . Incontinence of feces   . Visual disturbances 02/06/2017  . Essential hypertension 11/07/2016  . Hyperlipidemia 04/24/2015  . Dysuria 02/11/2015  . Acute cystitis without hematuria 02/11/2015  . Chest pain 02/06/2015  . Left inguinal hernia 08/02/2013  . Gait abnormality 05/14/2013  . Gait disorder   . Nystagmus, end-position   . Routine health maintenance 12/13/2011  . LEG CRAMPS, NOCTURNAL 12/07/2010  . CALLUS, TOE 03/19/2010  . hip pain 03/19/2010  . ATAXIA 11/17/2008  . CERVICAL RADICULOPATHY, RIGHT  10/21/2008  . DIVERTICULOSIS OF COLON 08/14/2008  . RENAL CYST 08/14/2008  . ANEMIA-IRON DEFICIENCY 06/16/2007  . Asthma 06/16/2007  . GERD 06/16/2007  . OSTEOPENIA 06/16/2007     Kathrine Cords, OTR/L, MSOT 01/25/2021, 5:09 PM  [facility]  [address]. [place], [place], [address] Phone: [phone]   Fax:  [phone]  Name: Kristine Francis MRN: [id number] Date of Birth: 1941-05-26

## 2021-01-25 NOTE — Patient Instructions (Signed)
Find memory book and bring to next session!

## 2021-01-27 ENCOUNTER — Encounter: Payer: Self-pay | Admitting: Physical Therapy

## 2021-01-27 ENCOUNTER — Ambulatory Visit: Payer: Medicare Other | Admitting: Speech Pathology

## 2021-01-27 ENCOUNTER — Ambulatory Visit: Payer: Medicare Other | Admitting: Physical Therapy

## 2021-01-27 ENCOUNTER — Other Ambulatory Visit: Payer: Self-pay

## 2021-01-27 ENCOUNTER — Encounter: Payer: Self-pay | Admitting: Speech Pathology

## 2021-01-27 ENCOUNTER — Ambulatory Visit: Payer: Medicare Other | Admitting: Occupational Therapy

## 2021-01-27 ENCOUNTER — Encounter: Payer: Self-pay | Admitting: Occupational Therapy

## 2021-01-27 DIAGNOSIS — R2681 Unsteadiness on feet: Secondary | ICD-10-CM

## 2021-01-27 DIAGNOSIS — M6281 Muscle weakness (generalized): Secondary | ICD-10-CM

## 2021-01-27 DIAGNOSIS — R278 Other lack of coordination: Secondary | ICD-10-CM

## 2021-01-27 DIAGNOSIS — R260 Ataxic gait: Secondary | ICD-10-CM | POA: Diagnosis not present

## 2021-01-27 DIAGNOSIS — R41840 Attention and concentration deficit: Secondary | ICD-10-CM

## 2021-01-27 DIAGNOSIS — R41841 Cognitive communication deficit: Secondary | ICD-10-CM

## 2021-01-27 DIAGNOSIS — R270 Ataxia, unspecified: Secondary | ICD-10-CM

## 2021-01-27 DIAGNOSIS — R2689 Other abnormalities of gait and mobility: Secondary | ICD-10-CM | POA: Diagnosis not present

## 2021-01-27 DIAGNOSIS — R1312 Dysphagia, oropharyngeal phase: Secondary | ICD-10-CM

## 2021-01-27 DIAGNOSIS — G40409 Other generalized epilepsy and epileptic syndromes, not intractable, without status epilepticus: Secondary | ICD-10-CM | POA: Diagnosis not present

## 2021-01-27 DIAGNOSIS — G9340 Encephalopathy, unspecified: Secondary | ICD-10-CM | POA: Diagnosis not present

## 2021-01-27 NOTE — Therapy (Signed)
[facility]. [place], [place], [address] Phone: [phone]   Fax:  [phone]  Physical Therapy Treatment  Patient Details  Name: Kristine Francis MRN: [id number] Date of Birth: 10-30-1941 Referring Provider (PT): Cathlyn Parsons, PA-C   Encounter Date: 01/27/2021   PT End of Session - 01/27/21 1623    Visit Number 7    Number of Visits 17    Date for PT Re-Evaluation 03/01/21    PT Start Time 6962    PT Stop Time 1614    PT Time Calculation (min) 44 min    Equipment Utilized During Treatment Gait belt    Activity Tolerance Patient tolerated treatment well    Behavior During Therapy [facility] for tasks assessed/performed           Past Medical History:  Diagnosis Date  . Allergy   . Asthma    sees Dr. Tiajuana Amass - as child per pt  . At high risk for falls    unstable gait   . Ataxia   . Cataract    removed bilaterally   . Diverticulosis   . Emphysema   . Gait abnormality 05/14/2013   [name] a patient of Dr. Linda Hedges presented first interpreter thousand 13 with a gait dysfunction, she also had an episodic confusion and Loss device. Exam found a mild nystagmus and she was referred to ophthalmology on 03-2812, brain MRI was normal nystagmus was a secondary diagnosis to vestibulitis, ear nose and throat has followed and had seen the patient. At the time of the nystagmus the patie  . GERD (gastroesophageal reflux disease)   . Hepatic steatosis   . Hyperlipidemia    on atorvastatin   . Hypertension    on losartan   . Iron deficiency anemia, unspecified   . Kidney cysts 11/16/12   small cyst on left  . Nystagmus, end-position   . Osteopenia   . Renal cyst   . Stroke ([place]) 01/2017   mild     Past Surgical History:  Procedure Laterality Date  . ANAL RECTAL MANOMETRY N/A 12/20/2017   Procedure: ANO RECTAL MANOMETRY;  Surgeon: Mauri Pole, MD;  Location: [facility];  Service: Endoscopy;   Laterality: N/A;  . BREAST BIOPSY Bilateral 1970's  . CATARACT EXTRACTION Bilateral LT:02/14/13,RT:02/21/13  . CERVICAL SPINE SURGERY  2010  . COLONOSCOPY  03-12-14   per Dr. Olevia Perches, diverticulosis only, repeat 5 yrs   . EYE SURGERY Bilateral 01/2013&02/2013   left then right  . HERNIA REPAIR  Oct '14-left, remote-right   years ago right; left done '14  . SKIN CANCER EXCISION  05/13/13   FACE, basal cell  . TONSILLECTOMY      There were no vitals filed for this visit.   Subjective Assessment - 01/27/21 1538    Subjective Pt and husband report no new falls    Currently in Pain? No/denies                             [facility] Adult PT Treatment/Exercise - 01/27/21 1539      Ambulation/Gait   Ambulation Distance (Feet) 300 Feet    Gait Comments gait with RW; CGA-minA. Widened BOS, mild ataxia, RW helps to stabilize. Cues for sequencing esp with turning to sit      High Level Balance   High Level Balance Activities Side stepping;Backward walking    High Level Balance Comments rhomberg  no UE assist EO, walking fwd/bkwd, sidestepping      Knee/Hip Exercises: Aerobic   Nustep L5 x 7 min      Knee/Hip Exercises: Seated   Other Seated Knee/Hip Exercises Seated trunk rotation  x10 B wit red pball to tap bones R/L    Sit to Sand 2 sets;10 reps;without UE support   holding red weighted ball                   PT Short Term Goals - 01/13/21 1511      PT SHORT TERM GOAL #1   Title Pt and caregiver/spouse independent with initiating initial HEP    Baseline spouse reports difficult to get pt to maintain attention for HEP; educated on providing visual cuing    Time 2    Period Weeks    Status On-going    Target Date 01/18/21      PT SHORT TERM GOAL #2   Title Pt will improve five time sit to stand from mat, no UE support to </= 15 seconds but with improved anterior weight shift and decreased posterior LOB    Baseline able to perform with no UE support but does  demo lack of anterior weight shift and occasional posterior LOB    Time 4    Period Weeks    Status On-going    Target Date 02/01/21      PT SHORT TERM GOAL #3   Title Pt will demo improved static standing balance to at least 1 minute with close supervision and minimal sway, without LOB    Baseline ~20 sec this rx    Time 4    Period Weeks    Status On-going    Target Date 02/01/21      PT SHORT TERM GOAL #4   Title Pt and caregiver will demo good understanding of falls prevention and safe functional mobility within the home to prevent falls    Time 2    Period Weeks    Status On-going    Target Date 01/18/21             PT Long Term Goals - 01/04/21 1640      PT LONG TERM GOAL #1   Title Pt will demonstrate independence with final HEP    Time 8    Period Weeks    Status New    Target Date 03/01/21      PT LONG TERM GOAL #2   Title Pt will improve five time sit to stand without use of UE to </= 13 seconds and no posterior LOB    Time 8    Period Weeks    Status New    Target Date 03/01/21      PT LONG TERM GOAL #3   Title Pt will demo BLE strength at least 4+/5    Time 8    Period Weeks    Status New    Target Date 03/01/21      PT LONG TERM GOAL #4   Title Pt will improve BERG by at least 10  points to indicate decreased falls risk    Baseline 24/56    Time 8    Period Weeks    Status New    Target Date 03/01/21      PT LONG TERM GOAL #5   Title Pt will ambulate and complete direction changes with least restrictive assistive device safely without LOB to facilitate improved safety with community negotiation  Time 8    Period Weeks    Status New    Target Date 03/01/21                 Plan - 01/27/21 1623    Clinical Impression Statement Pt with limited carryover from session to session but can recall cues given with a session. Able to perform STS with no UE assist holding red weighted ball with CGA. Cues for increased step length with  backwards walking. CGA with occasional minA for all high level balance activities. Cues for reduced shoulder elevation and keeping RW close to body with ambulation.    PT Treatment/Interventions ADLs/Self Care Home Management;Aquatic Therapy;DME Instruction;Gait training;Stair training;Functional mobility training;Therapeutic activities;Therapeutic exercise;Balance training;Neuromuscular re-education;Patient/family education;Vestibular;Visual/perceptual remediation/compensation;Manual techniques;Taping    PT Next Visit Plan Progressive TE and balance/neurore-ed exercises as tolerated. Frequent cues for safety. Standing balance/vestibular/multi-sensory balance activities, postural exercises, dynamic balance/ balance recovery.    Consulted and Agree with Plan of Care Patient;Family member/caregiver    Family Member Consulted husband           Patient will benefit from skilled therapeutic intervention in order to improve the following deficits and impairments:  Abnormal gait,Decreased balance,Decreased coordination,Decreased strength,Difficulty walking,Impaired tone,Impaired UE functional use,Impaired vision/preception,Postural dysfunction,Decreased mobility,Decreased safety awareness,Decreased endurance  Visit Diagnosis: Muscle weakness (generalized)  Ataxia  Other lack of coordination  Unsteadiness on feet  Other abnormalities of gait and mobility     Problem List Patient Active Problem List   Diagnosis Date Noted  . PAF (paroxysmal atrial fibrillation) ([place]) 12/30/2020  . Heme + stool 12/27/2020  . Hyponatremia 12/27/2020  . Anemia 12/27/2020  . Protein-calorie malnutrition, severe 12/16/2020  . Encephalopathy 12/10/2020  . Grand mal seizure ([place]) 12/02/2020  . COPD (chronic obstructive pulmonary disease) ([place]) 12/02/2020  . Malnutrition of moderate degree 11/28/2020  . Acute encephalopathy   . Acute respiratory failure with hypoxia ([place])   . Aspiration into airway   .  Cardiac arrest ([place])   . Groin mass 08/20/2020  . Falls infrequently 03/27/2019  . Sensory neuropathy 02/01/2019  . 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 Cerebellar ataxia ([place]) 08/23/2018  . Sensory polyneuropathy 08/23/2018  . Incontinence of feces   . Visual disturbances 02/06/2017  . Essential hypertension 11/07/2016  . Hyperlipidemia 04/24/2015  . Dysuria 02/11/2015  . Acute cystitis without hematuria 02/11/2015  . Chest pain 02/06/2015  . Left inguinal hernia 08/02/2013  . Gait abnormality 05/14/2013  . Gait disorder   . Nystagmus, end-position   . Routine health maintenance 12/13/2011  . LEG CRAMPS, NOCTURNAL 12/07/2010  . CALLUS, TOE 03/19/2010  . hip pain 03/19/2010  . ATAXIA 11/17/2008  . CERVICAL RADICULOPATHY, RIGHT 10/21/2008  . DIVERTICULOSIS OF COLON 08/14/2008  . RENAL CYST 08/14/2008  . ANEMIA-IRON DEFICIENCY 06/16/2007  . Asthma 06/16/2007  . GERD 06/16/2007  . OSTEOPENIA 06/16/2007   Amador Cunas, PT, DPT [facility] 01/27/2021, 4:26 PM  [facility]. [place], [place], [address] Phone: [phone]   Fax:  [phone]  Name: MARYSOL WELLNITZ MRN: [id number] Date of Birth: 07-Sep-1941

## 2021-01-27 NOTE — Therapy (Signed)
[place]. [place], [place], [address] Phone: [phone]   Fax:  [phone]  Speech Language Pathology Treatment  Patient Details  Name: Kristine Francis MRN: [id number] Date of Birth: [birth year] No data recorded  Encounter Date: 01/27/2021   End of Session - 01/27/21 1458    Visit Number 7    Number of Visits 25    Date for SLP Re-Evaluation 04/03/21    SLP Start Time 35    SLP Stop Time  1445    SLP Time Calculation (min) 45 min    Activity Tolerance Patient tolerated treatment well           Past Medical History:  Diagnosis Date   Allergy    Asthma    sees Dr. Tiajuana Amass - as child per pt   At high risk for falls    unstable gait    Ataxia    Cataract    removed bilaterally    Diverticulosis    Emphysema    Gait abnormality 05/14/2013   Kristine Francis a patient of Dr. Linda Hedges presented first interpreter thousand 13 with a gait dysfunction, she also had an episodic confusion and Loss device. Exam found a mild nystagmus and she was referred to ophthalmology on 03-2812, brain MRI was normal nystagmus was a secondary diagnosis to vestibulitis, ear nose and throat has followed and had seen the patient. At the time of the nystagmus the patie   GERD (gastroesophageal reflux disease)    Hepatic steatosis    Hyperlipidemia    on atorvastatin    Hypertension    on losartan    Iron deficiency anemia, unspecified    Kidney cysts 11/16/12   small cyst on left   Nystagmus, end-position    Osteopenia    Renal cyst    Stroke ([place]) 01/2017   mild     Past Surgical History:  Procedure Laterality Date   ANAL RECTAL MANOMETRY N/A 12/20/2017   Procedure: ANO RECTAL MANOMETRY;  Surgeon: Mauri Pole, MD;  Location: [facility];  Service: Endoscopy;  Laterality: N/A;   BREAST BIOPSY Bilateral 1970's   CATARACT EXTRACTION Bilateral LT:02/14/13,RT:02/21/13   CERVICAL SPINE SURGERY  2010    COLONOSCOPY  03-12-14   per Dr. Olevia Perches, diverticulosis only, repeat 5 yrs    EYE SURGERY Bilateral 01/2013&02/2013   left then right   HERNIA REPAIR  Oct '14-left, remote-right   years ago right; left done '14   SKIN CANCER EXCISION  05/13/13   FACE, basal cell   TONSILLECTOMY      There were no vitals filed for this visit.   Subjective Assessment - 01/27/21 1450    Subjective I am feeling good. It's a great day to look outside."    Patient is accompained by: Family member    Currently in Pain? No/denies                 ADULT SLP TREATMENT - 01/27/21 0001      General Information   Behavior/Cognition Alert;Cooperative;Pleasant mood;Requires cueing      Treatment Provided   Treatment provided Dysphagia      Dysphagia Treatment   Patient observed directly with PO's Yes    Type of PO's observed Thin liquids;Honey-thick liquids    Liquids provided via Cup;No straw    Pharyngeal Phase Signs & Symptoms Multiple swallows;Suspected delayed swallow initiation;Audible swallow;Wet vocal quality;Delayed throat clear;Delayed cough;Watery eyes   Seen with THIN liquids; ONLY delayed  throat clears noted with moderately thick (honey).   Type of cueing Verbal    Amount of cueing Maximal    Other treatment/comments Edu on aspiration and aspiration PNA. Shown videos of "normal" swallow vs. aspiration of PO.      Cognitive-Linquistic Treatment   Treatment focused on Cognition    Skilled Treatment Addressed memory of important information on dysphagia recommendations. Provided handout to aid as a visual at home. Pt husband reports pt is noncompliant with thickened liquids at home.      Assessment / Recommendations / Plan   Plan Continue with current plan of care      Dysphagia Recommendations   Diet recommendations Other(comment)   Minced and moist, Moderately thick liquids   Liquids provided via Cup    Supervision Full supervision/cueing for compensatory strategies    Compensations  Slow rate;Small sips/bites;Multiple dry swallows after each bite/sip;Clear throat intermittently;Effortful swallow    Postural Changes and/or Swallow Maneuvers Seated upright 90 degrees;Upright 30-60 min after meal      General Recommendations   Oral Care Recommendations Other (Comment)   Oral care before THIN water consumption (FFWP)     Progression Toward Goals   Progression toward goals Progressing toward goals            SLP Education - 01/27/21 1457    Education Details Provided edu on [employer] Protocol, aspiration precautions, thickener.    Person(s) Educated Patient;Spouse    Methods Explanation;Demonstration;Tactile cues;Verbal cues;Handout    Comprehension Need further instruction;Verbal cues required;Tactile cues required;Verbalized understanding            SLP Short Term Goals - 01/27/21 1459      SLP SHORT TERM GOAL #1   Title Patient will tell SLP 3 areas of deficit/challenge for her given 2 verbal cues.    Time 4    Period Weeks    Status On-going      SLP SHORT TERM GOAL #2   Title Patient will recall personally-relevant information post >5 minute delay.    Baseline <5 min    Time 4    Period Weeks    Status On-going      SLP SHORT TERM GOAL #3   Title The patient will complete swallowing maneuvers (supraglottic swallow, Mendelson maneuver, effortful swallow, etc.) to improve oral motor weakness, tongue base retraction, hyolaryngeal excursion, airway protection, and clearance of the bolus through the pharynx with moderate verbal, visual and tactile cues.    Time 4    Period Weeks    Status On-going            SLP Long Term Goals - 01/27/21 1459      SLP LONG TERM GOAL #1   Title Client will utilize compensatory strategies with optimum safety and efficiency of swallowing function of P.O. intake without overt signs and symptoms of aspiration for the highest appropriate diet level.    Baseline honey thick/dysphagia 2/minced and moist    Time  10    Period Weeks    Status On-going      SLP LONG TERM GOAL #2   Title Patient will demonstrate use of memory strategies to schedule activities, recall weekly events and items to maintain safety to participate socially in functional living environment    Time 10    Period Weeks    Status On-going      SLP LONG TERM GOAL #3   Title Patient will demonstrate use of information processing and self monitoring during daily  living activities to improve safety and awareness in functional living environment    Time 10    Period Weeks    Status On-going      SLP LONG TERM GOAL #4   Title Patient will develop functional attention skills to effectively attend to and communicate in tasks of daily living in their functional living environment.    Time 10    Period Weeks    Status On-going            Plan - 01/27/21 1458    Clinical Impression Statement Pt is a 80 yo female with hx of encephalopathy 2/2 to seizures. Hx of intubation and ventilator use. Dysphagia. Dysarthria. Cognitive deficit. Pt was given the SLUMS which indicated a deficit in attention, short term memory, problem solving, and executive functioning skills. Pt husband is responsible for all ADLs due to significant memory deficits. Pt reports she would like to be more self-sufficient with medications and her thinking skills. Previous documentation and MBS rec dysphagia 2 diet and honey thick liquids. SLP to address "minced and moist (IDDSI)" terminology with patient and spouse. Oropharyngeal dysphagia to be addressed in treatment. Dysarthria noted in conversation with impaired articulation. Gurgly vocal quality noted. "Silent aspiration" addressed with patient and spouse. SLP rec skilled speech services to address cognitive-linguistic deficit, dysphagia, and dysarthria treatments to increase quality of life and participation. Adding [employer]    Speech Therapy Frequency 2x / week    Duration 12 weeks     Treatment/Interventions Aspiration precaution training;Trials of upgraded texture/liquids;Compensatory strategies;Pharyngeal strengthening exercises;Oral motor exercises;Cueing hierarchy;Functional tasks;Patient/family education;Diet toleration management by SLP;Environmental controls;Cognitive reorganization;Compensatory techniques;Internal/external aids;SLP instruction and feedback    Potential to Achieve Goals Fair    Potential Considerations Ability to learn/carryover information    Consulted and Agree with Plan of Care Patient;Family member/caregiver    Family Member Consulted [name] (spouse)           Patient will benefit from skilled therapeutic intervention in order to improve the following deficits and impairments:   Dysphagia, oropharyngeal phase  Cognitive communication deficit    Problem List Patient Active Problem List   Diagnosis Date Noted   PAF (paroxysmal atrial fibrillation) ([place]) 12/30/2020   Heme + stool 12/27/2020   Hyponatremia 12/27/2020   Anemia 12/27/2020   Protein-calorie malnutrition, severe 12/16/2020   Encephalopathy 12/10/2020   Grand mal seizure ([place]) 12/02/2020   COPD (chronic obstructive pulmonary disease) ([place]) 12/02/2020   Malnutrition of moderate degree 11/28/2020   Acute encephalopathy    Acute respiratory failure with hypoxia ([place])    Aspiration into airway    Cardiac arrest ([place])    Groin mass 08/20/2020   Falls infrequently 03/27/2019   Sensory neuropathy 02/01/2019   Luetscher's syndrome 08/23/2018   Cerebellar ataxia ([place]) 08/23/2018   Sensory polyneuropathy 08/23/2018   Incontinence of feces    Visual disturbances 02/06/2017   Essential hypertension 11/07/2016   Hyperlipidemia 04/24/2015   Dysuria 02/11/2015   Acute cystitis without hematuria 02/11/2015   Chest pain 02/06/2015   Left inguinal hernia 08/02/2013   Gait abnormality 05/14/2013   Gait disorder    Nystagmus, end-position    Routine  health maintenance 12/13/2011   LEG CRAMPS, NOCTURNAL 12/07/2010   CALLUS, TOE 03/19/2010   hip pain 03/19/2010   ATAXIA 11/17/2008   CERVICAL RADICULOPATHY, RIGHT 10/21/2008   DIVERTICULOSIS OF COLON 08/14/2008   RENAL CYST 08/14/2008   ANEMIA-IRON DEFICIENCY 06/16/2007   Asthma 06/16/2007   GERD 06/16/2007  OSTEOPENIA 06/16/2007    Verdene Lennert MS, [place], CBIS  01/27/2021, 3:01 PM  [place]. [place], [place], [address] Phone: [phone]   Fax:  [phone]   Name: DANAHI REDDISH MRN: [id number] Date of Birth: 05-18-1941

## 2021-01-27 NOTE — Patient Instructions (Signed)
See printed sheet for "Swallowing Reminders". Use your thickened liquids :)

## 2021-01-28 NOTE — Therapy (Signed)
[place]. [place], [place], [address] Phone: [phone]   Fax:  [phone]  Occupational Therapy Treatment  Patient Details  Name: Kristine Francis MRN: [id number] Date of Birth: 1941/01/19 Referring Provider (OT): Lauraine Rinne, PA-C   Encounter Date: 01/27/2021   OT End of Session - 01/27/21 1456    Visit Number 7    Number of Visits 13    Date for OT Re-Evaluation 02/22/21    Authorization Type Medicare    Progress Note Due on Visit 10    OT Start Time 1445    OT Stop Time 1530    OT Time Calculation (min) 45 min    Activity Tolerance Patient tolerated treatment well    Behavior During Therapy [facility] for tasks assessed/performed           Past Medical History:  Diagnosis Date  . Allergy   . Asthma    sees Dr. Tiajuana Amass - as child per pt  . At high risk for falls    unstable gait   . Ataxia   . Cataract    removed bilaterally   . Diverticulosis   . Emphysema   . Gait abnormality 05/14/2013   Ms.Cookston a patient of Dr. Linda Hedges presented first interpreter thousand 13 with a gait dysfunction, she also had an episodic confusion and Loss device. Exam found a mild nystagmus and she was referred to ophthalmology on 03-2812, brain MRI was normal nystagmus was a secondary diagnosis to vestibulitis, ear nose and throat has followed and had seen the patient. At the time of the nystagmus the patie  . GERD (gastroesophageal reflux disease)   . Hepatic steatosis   . Hyperlipidemia    on atorvastatin   . Hypertension    on losartan   . Iron deficiency anemia, unspecified   . Kidney cysts 11/16/12   small cyst on left  . Nystagmus, end-position   . Osteopenia   . Renal cyst   . Stroke ([place]) 01/2017   mild     Past Surgical History:  Procedure Laterality Date  . ANAL RECTAL MANOMETRY N/A 12/20/2017   Procedure: ANO RECTAL MANOMETRY;  Surgeon: Mauri Pole, MD;  Location: [facility];  Service:  Endoscopy;  Laterality: N/A;  . BREAST BIOPSY Bilateral 1970's  . CATARACT EXTRACTION Bilateral LT:02/14/13,RT:02/21/13  . CERVICAL SPINE SURGERY  2010  . COLONOSCOPY  03-12-14   per Dr. Olevia Perches, diverticulosis only, repeat 5 yrs   . EYE SURGERY Bilateral 01/2013&02/2013   left then right  . HERNIA REPAIR  Oct '14-left, remote-right   years ago right; left done '14  . SKIN CANCER EXCISION  05/13/13   FACE, basal cell  . TONSILLECTOMY      There were no vitals filed for this visit.   Subjective Assessment - 01/27/21 1452    Subjective  Pt stated "this coffee really doesn't taste that bad" when drinking coffee w/ thickener received in prior session w/ SLP    Patient is accompanied by: Family member   [name] (husband)   Pertinent History CVA (March 2018), ataxia, COPD, osteopenia, neuropathy, gait abnormality, asthma, iron deficiency anemia, GERD, and hx nystagmus    Patient Stated Goals Get back to being independent; play tennis again    Currently in Pain? No/denies            OT Treatments/Exercises (OP) - 01/27/21 1519      Shoulder Exercises: Seated   Row Strengthening;Both;15 reps;Weights  Alternating UE chest press w/ arms abducted; pt required cueing to slow movements down and achieve full ROM on R side   Row Weight (lbs) 2# dumbbells    Horizontal ABduction Strengthening;Both;15 reps;Weights   BUE horizontal abduction and adduction   Horizontal ABduction Weight (lbs) 2# dumbbells    Flexion Strengthening;Both;15 reps;Weights   Alternating UE overhead press; OT provided modeling for pacing and body mechanics   Flexion Weight (lbs) 2# dumbbells      Visual/Perceptual Exercises   Other Exercises Trail making on whiteboard w/ pt instructed to erase each number to facilitate eye-hand coordination and targeting; during 2nd trial, activity was graded up to include alternating between numbers and letters, as well as erasing each target within a boundary   Completed 2 trials; Pt required  Mod A during trial involving alternating numbers and letters due to decreased working memory           OT Education - 01/27/21 2202    Education Details Energy conservation strategies reviewed. Also, due to pt's memory deficits, OT discussed pt's hospitalization, resulting limitations, and reviewed goals of OT w/ pt. Therapist mentioned that if these were recurring questions, making a handout to include in pt's memory book might be beneficial.    Person(s) Educated Patient;Spouse    Methods Explanation    Comprehension Verbalized understanding;Need further instruction            OT Short Term Goals - 01/14/21 0818      OT SHORT TERM GOAL #1   Title Pt will be able to complete initial HEP with Min A from caregiver    Baseline No current HEP    Time 3    Period Weeks    Status On-going    Target Date 01/26/21      OT SHORT TERM GOAL #2   Title Pt will be able to complete asymmetrical bilateral coordination activities independently in at least 3/4 trials to improve functional UE use during ADLs.    Baseline Decreased BUE GM coordination    Time 3    Period Weeks    Status On-going            OT Long Term Goals - 01/14/21 0819      OT LONG TERM GOAL #1   Title Pt will complete HEP designed for BUE strength and coordination with Mod I and report carryover to home    Baseline No current HEP    Time 6    Period Weeks    Status On-going      OT LONG TERM GOAL #2   Title Pt will demonstrate preparing a simple meal safely with Min A    Baseline Currently unable to prepare meals safely    Time 6    Period Weeks    Status On-going      OT LONG TERM GOAL #3   Title Pt will be able to safely complete UB/LB dressing with Mod I at least 75% of the time    Baseline Currently SPV/SBA for dressing    Time 6    Period Weeks    Status On-going      OT LONG TERM GOAL #4   Title Pt will be able to manipulate clothing fasteners without assistance in at least 4/5 trials     Baseline Decreased [place] as evidenced by time on 9-Hole Peg Test    Time 6    Period Weeks    Status On-going  OT LONG TERM GOAL #5   Title Pt will verbalize understanding of at least 3 compensatory/safety strategies to improve safety with ADLs and functional recall    Baseline Decreased safety awareness and recall; no current compensatory strategies for ADLs    Time 6    Period Weeks    Status On-going            Plan - 01/27/21 2206    Clinical Impression Statement Pt demonstrated slightly improved strength and coordination w/ BUEs this session, particularly when cued to decrease speed of movements and visually attend to her UEs while completing exercises. Pt's memory deficits continue to be limiting for functional tasks and safety at home.    OT Occupational Profile and History Detailed Assessment- Review of Records and additional review of physical, cognitive, psychosocial history related to current functional performance    Occupational performance deficits (Please refer to evaluation for details): ADL's;IADL's;Leisure    Body Structure / Function / Physical Skills ADL;Decreased knowledge of precautions;ROM;UE functional use;FMC;Dexterity;Body mechanics;Decreased knowledge of use of DME;Balance;Vision;GMC;Pain;Strength;IADL;Coordination    Cognitive Skills Attention;Problem Solve;Safety Awareness    Rehab Potential Good    Clinical Decision Making Several treatment options, min-mod task modification necessary    Comorbidities Affecting Occupational Performance: Presence of comorbidities impacting occupational performance    Modification or Assistance to Complete Evaluation  Min-Moderate modification of tasks or assist with assess necessary to complete eval    OT Frequency 2x / week    OT Duration 6 weeks    OT Treatment/Interventions Self-care/ADL training;Therapeutic exercise;Visual/perceptual remediation/compensation;Aquatic Therapy;Moist Heat;Neuromuscular  education;Patient/family education;Energy conservation;Therapeutic activities;Fluidtherapy;Cryotherapy;DME and/or AE instruction;Manual Therapy;Passive range of motion;Cognitive remediation/compensation    Plan BUE strengthening; review energy conservation    Consulted and Agree with Plan of Care Patient;Family member/caregiver    Family Member Consulted Husband [name])           Patient will benefit from skilled therapeutic intervention in order to improve the following deficits and impairments:   Body Structure / Function / Physical Skills: ADL,Decreased knowledge of precautions,ROM,UE functional use,FMC,Dexterity,Body mechanics,Decreased knowledge of use of DME,Balance,Vision,GMC,Pain,Strength,IADL,Coordination Cognitive Skills: Attention,Problem Solve,Safety Awareness     Visit Diagnosis: Ataxia  Muscle weakness (generalized)  Other lack of coordination  Attention and concentration deficit    Problem List Patient Active Problem List   Diagnosis Date Noted  . PAF (paroxysmal atrial fibrillation) ([place]) 12/30/2020  . Heme + stool 12/27/2020  . Hyponatremia 12/27/2020  . Anemia 12/27/2020  . Protein-calorie malnutrition, severe 12/16/2020  . Encephalopathy 12/10/2020  . Grand mal seizure ([place]) 12/02/2020  . COPD (chronic obstructive pulmonary disease) ([place]) 12/02/2020  . Malnutrition of moderate degree 11/28/2020  . Acute encephalopathy   . Acute respiratory failure with hypoxia ([place])   . Aspiration into airway   . Cardiac arrest ([place])   . Groin mass 08/20/2020  . Falls infrequently 03/27/2019  . Sensory neuropathy 02/01/2019  . 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 Cerebellar ataxia ([place]) 08/23/2018  . Sensory polyneuropathy 08/23/2018  . Incontinence of feces   . Visual disturbances 02/06/2017  . Essential hypertension 11/07/2016  . Hyperlipidemia 04/24/2015  . Dysuria 02/11/2015  . Acute cystitis without hematuria 02/11/2015  . Chest pain 02/06/2015  . Left  inguinal hernia 08/02/2013  . Gait abnormality 05/14/2013  . Gait disorder   . Nystagmus, end-position   . Routine health maintenance 12/13/2011  . LEG CRAMPS, NOCTURNAL 12/07/2010  . CALLUS, TOE 03/19/2010  . hip pain 03/19/2010  . ATAXIA 11/17/2008  . CERVICAL RADICULOPATHY, RIGHT 10/21/2008  .  DIVERTICULOSIS OF COLON 08/14/2008  . RENAL CYST 08/14/2008  . ANEMIA-IRON DEFICIENCY 06/16/2007  . Asthma 06/16/2007  . GERD 06/16/2007  . OSTEOPENIA 06/16/2007     Kathrine Cords, OTR/L, MSOT 01/28/2021, 12:31 PM  [place]. [place], [place], [address] Phone: [phone]   Fax:  [phone]  Name: SHANEQUIA KENDRICK MRN: [id number] Date of Birth: 02/11/1941

## 2021-02-01 ENCOUNTER — Encounter: Payer: Self-pay | Admitting: Neurology

## 2021-02-01 ENCOUNTER — Ambulatory Visit (INDEPENDENT_AMBULATORY_CARE_PROVIDER_SITE_OTHER): Payer: Medicare Other | Admitting: Neurology

## 2021-02-01 VITALS — BP 143/74 | HR 64 | Ht 60.0 in | Wt 91.0 lb

## 2021-02-01 DIAGNOSIS — R1310 Dysphagia, unspecified: Secondary | ICD-10-CM

## 2021-02-01 DIAGNOSIS — R270 Ataxia, unspecified: Secondary | ICD-10-CM

## 2021-02-01 DIAGNOSIS — R490 Dysphonia: Secondary | ICD-10-CM | POA: Diagnosis not present

## 2021-02-01 DIAGNOSIS — G629 Polyneuropathy, unspecified: Secondary | ICD-10-CM

## 2021-02-01 DIAGNOSIS — F0390 Unspecified dementia without behavioral disturbance: Secondary | ICD-10-CM | POA: Diagnosis not present

## 2021-02-01 DIAGNOSIS — G40001 Localization-related (focal) (partial) idiopathic epilepsy and epileptic syndromes with seizures of localized onset, not intractable, with status epilepticus: Secondary | ICD-10-CM

## 2021-02-01 NOTE — Progress Notes (Addendum)
Provider:  Larey Seat, MD  Primary Care Physician:  [name], MD [place] [id number]     Referring Provider: Bary Leriche, [address] [place],  [place] [address]          Chief Complaint according to patient   Patient presents with:    . New Patient (Initial Visit)     Pt with husband, rm 63. Presents today follow up post Rehab complete- ER visit in Jan 22. She was hospitalized with a stroke and suffered a new onset seizure.  She has been working and completing therapy intially in the inpt rehab and not outpt. She is completing PT,OT and ST.  Overall doing well. Was started on Keppra in hospital. Right side was more effected then left. States there are moments where laying flat and feels like she is falling.  No dizziness associated      HISTORY OF PRESENT ILLNESS:   Kristine Francis:    Kristine Francis is a 80 y.o. year old Caucasian female patient is seen here upon a hospital  Francis on 02/01/2021 from [place] [facility].   .  Chief concern according to patient : So I have the pleasure of seeing Kristine Francis today who prefers to go by the name Kristine Francis.  She is a 80 year old female was most recently neurology patient with Dr. Carles Collet and [facility] neurology.  On November 25, 2020 she was admitted to hospital as a code stroke.  She had been at her baseline, she was in the presence of her grand children at home when the granddaughter heard a loud scream and found the patient leaning over down on the table.  Husband returned soon after to find her leaning on the table unable to move.  EMS was called she was brought in as a code stroke she had a right gaze deviation followed for one-step commands only and does seem not to have any word output.  In the emergency room she had developed copious amounts of secretions and she had a generalized tonic-clonic seizure in front of the consulting physician.  Was an ictal cry her  right hand went up tonically gaze was deviated to the right and then she had full body rhythmic generalized tonic-clonic convulsions.  She went bradycardic into her 40s she was not intubated given 2 mg of Ativan and loaded with 1000 mg of Keppra IV a CT angio was obtained CT head was negative for territorial infarct she was premedicated with Solu-Medrol Benadryl and then a CT angiogram with contrast was obtained..  She had no previous indication of a CNS infection, there was no evidence of any kind of unusual headache, confusion or delirium.  No prescribed his prior history of seizures.  TPA was not given as the patient did not show signs of a stroke by CT.  The patient had been seen in 2018 for stroke with follow-up with Dr. Guillermina [place].  MRI of the brain and a EEG were also ordered EEG was a little impaired because of the sedation.  So there were no seizures or epileptiform discharges noted on the EEG as this was performed while the patient was already under sedate of medications.  The MRI with and without contrast had not been obtained on 5 January cerebrum remote and only lacunar infarct in her right basal ganglia small vessel ischemia no acute infarction hemorrhage hydrocephalus also was noted.  So the stroke was actually not found.  She then  Underwent some rehab and therapy with speech pathology especially because her swallowing and her voice had changed.  Speech therapy was recommended for a duration of 2 weeks twice a week and is now completed.  She still is very hoarse laboratories show that the patient was quite anemic in January and that she had low sodium levels down 232 mmol/L, her calcium levels were 8.2 also low but should have meanwhile been corrected.  She was found to have GI bleed with heme positive stools.  Dr. 5 her family doctor saw the patient and stated that he had no sign of further seizure activity.  She had remained encephalopathic for several weeks until he saw her on 30 December 2020.   She remains dysphonic and dysphagic- no more seizure activity.        Kristine Francis or Caucasian female with a recent stroke, and  has a past medical history of Allergy, Asthma, At high risk for falls, Ataxia, Cataract, Diverticulosis, Emphysema, Gait abnormality (05/14/2013), GERD (gastroesophageal reflux disease), Hepatic steatosis, Hyperlipidemia, Hypertension, Iron deficiency anemia, unspecified, Kidney cysts (11/16/12), Nystagmus, end-position, Osteopenia, Renal cyst, and Stroke ([place]) (01/2017) seen by Dr. Carles Collet through 2018 .Marland Kitchen      Social history: Patient lives in a household with spouse of 90 year- adult children and many grandchildren. Reports memory loss.  Tobacco use- none .  ETOH use; none ,  Caffeine intake in form of Coffee( 1-2) Soda( /) Tea (/) or energy drinks. RLS, longstanding gait problems.      Review of Systems: Out of a complete 14 system review, the patient complains of only the following symptoms, and all other reviewed systems are negative.:    Dysphonia dysarthria, memory loss, swallowing    Social History   Socioeconomic History  . Marital status: Married    Spouse name: Not on file  . Number of children: 3  . Years of education: 23  . Highest education level: Not on file  Occupational History  . 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 Smoking status: Never Smoker  . Smokeless tobacco: Never Used  Vaping Use  . Vaping Use: Never used  Substance and Sexual Activity  . Alcohol use: No    Alcohol/week: 0.0 standard drinks  . Drug use: No  . Sexual activity: Yes    Partners: Male  Other Topics Concern  . 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 married - 1965. 3 sons - '66, '69, '70. Sons in good health. Marriage in good health. Enjoys retirement - remains active.         Social Determinants of Health   Financial  Resource Strain: Not on file  Food Insecurity: Not on file  Transportation Needs: Not on file  Physical Activity: Not on file  Stress: Not on file  Social Connections: Not on file    Family History  Problem Relation Age of Onset  . Colon cancer Father   . Coronary artery disease Father   . Heart block Father   . Pancreatic cancer Mother   . Throat cancer Mother   . Colon cancer Paternal Grandmother   . Colon cancer Paternal Aunt        x 2  . Cancer - Lung Paternal Aunt   . Breast cancer  Paternal Aunt   . Colon cancer Paternal Aunt   . Ovarian cancer Maternal Aunt   . Heart attack Paternal Grandfather   . Esophageal cancer Other   . Colon polyps Neg Hx   . Rectal cancer Neg Hx   . Stomach cancer Neg Hx     Past Medical History:  Diagnosis Date  . Allergy   . Asthma    sees Dr. Tiajuana Amass - as child per pt  . At high risk for falls    unstable gait   . Ataxia   . Cataract    removed bilaterally   . Diverticulosis   . Emphysema   . Gait abnormality 05/14/2013   Ms.Rubalcava a patient of Dr. Linda Hedges presented first [id number] with a gait dysfunction, she also had an episodic confusion and . Exam found a mild nystagmus and she was referred to ophthalmology on 03-2812, brain MRI was normal nystagmus was a secondary diagnosis to vestibulitis, ear nose and throat has followed and had seen the patient.  2018 -TAT followed after CVA and TIA 01-2017   . GERD (gastroesophageal reflux disease)   . Hepatic steatosis   . Hyperlipidemia    on atorvastatin   . Hypertension    on losartan   . Iron deficiency anemia, unspecified   . Kidney cysts 11/16/12   small cyst on left  . Nystagmus, end-position   . Osteopenia   . Renal cyst   . Stroke ([place]) 01/2017   mild     Past Surgical History:  Procedure Laterality Date  . ANAL RECTAL MANOMETRY N/A 12/20/2017   Procedure: ANO RECTAL MANOMETRY;  Surgeon: Mauri Pole, MD;  Location: [facility]COPY;  Service: Endoscopy;  Laterality:  N/A;  . BREAST BIOPSY Bilateral 1970's  . CATARACT EXTRACTION Bilateral LT:02/14/13,RT:02/21/13  . CERVICAL SPINE SURGERY  2010  . COLONOSCOPY  03-12-14   per Dr. Olevia Perches, diverticulosis only, repeat 5 yrs   . EYE SURGERY Bilateral 01/2013&02/2013   left then right  . HERNIA REPAIR  Oct '14-left, remote-right   years ago right; left done '14  . SKIN CANCER EXCISION  05/13/13   FACE, basal cell  . TONSILLECTOMY       Current Outpatient Medications on File Prior to Visit  Medication Sig Dispense Refill  . acetaminophen (TYLENOL) 325 MG tablet Take 1-2 tablets (325-650 mg total) by mouth every 4 (four) hours as needed for mild pain.    Marland Kitchen amLODipine (NORVASC) 2.5 MG tablet TAKE 1 TABLET BY MOUTH EVERY DAY 30 tablet 11  . apixaban (ELIQUIS) 5 MG TABS tablet Take 1 tablet (5 mg total) by mouth 2 (two) times daily. 60 tablet 11  . atorvastatin (LIPITOR) 10 MG tablet TAKE 1 TABLET BY MOUTH EVERY DAY 30 tablet 11  . EPINEPHrine 0.3 mg/0.3 mL IJ SOAJ injection Inject 0.3 mg into the muscle as needed for anaphylaxis.    . furosemide (LASIX) 20 MG tablet (Prior Auth: Rx [id number])    . ibuprofen (ADVIL) 800 MG tablet     . levETIRAcetam (KEPPRA) 500 MG tablet Take 1 tablet (500 mg total) by mouth 2 (two) times daily. 60 tablet 11  . losartan (COZAAR) 50 MG tablet (Prior Auth: Rx [id number])    . melatonin 3 MG TABS tablet Take 1 tablet (3 mg total) by mouth at bedtime. 30 tablet 11  . metoprolol tartrate (LOPRESSOR) 25 MG tablet TAKE 1 TABLET BY MOUTH TWICE A DAY 60 tablet 11  . Multiple Vitamin (MULTIVITAMIN WITH  MINERALS) TABS tablet Take 1 tablet by mouth daily.    . pantoprazole (PROTONIX) 20 MG tablet TAKE 1 TABLET BY MOUTH EVERY DAY 30 tablet 11  . STARCH-MALTO DEXTRIN (THICK-IT) PACK Take 1 each by mouth 4 (four) times daily as needed. Thicken liquids to honey consistency 200 each 0   No current facility-administered medications on file prior to visit.    Allergies  Allergen  Reactions  . Contrast Media [Iodinated Diagnostic Agents] Other (See Comments)    Congestion .Marland KitchenMarland Kitchenpatient stated it irritated her eyes and voice  . Iohexol Other (See Comments)    Gi upset  . Lisinopril Cough    Physical exam:  Today's Vitals   02/01/21 1411  BP: (!) 143/74  Pulse: 64  Weight: 91 lb (41.3 kg)  Height: 5' (1.524 m)   Body mass index is 17.77 kg/m.   Wt Readings from Last 3 Encounters:  02/01/21 91 lb (41.3 kg)  01/05/21 89 lb 3.2 oz (40.5 kg)  12/30/20 87 lb 9.6 oz (39.7 kg)     Ht Readings from Last 3 Encounters:  02/01/21 5' (1.524 m)  01/05/21 5\' 1"  (1.549 m)  12/10/20 5\' 1"  (1.549 m)      General: The patient is awake, alert and appears not in acute distress. The patient is well groomed. Head: Normocephalic, atraumatic.  Neck is supple. Mallampati :1.  Thickened liquids have been taken to help with swallowing. Anterior fusion scar well healed.  neck circumference:12 inches .  Nasal airflow patent. Very skinny,  Cardiovascular:  Regular rate and cardiac rhythm by pulse,  without distended neck veins. Respiratory: Lungs are clear to auscultation.  Skin:  Without evidence of ankle edema, or rash. Trunk: slightly stooped.  Neurologic exam : The patient is awake and alert, oriented to place and time.   Memory subjective described as intact.  3- 14 -2022 Attention span & concentration ability appears limited   Speech is fluent,  but laboured - with  dysarthria, dysphonia- and she reports dysphagia.   Mood and affect are appropriate.   Cranial nerves: no loss of smell or taste reported  Pupils are equal and briskly reactive to light. Funduscopic exam deferred. Left pupil is larger , iris scar noted , status post bilateral lens implant.  Extraocular movements in vertical and horizontal planes were with  Nystagmus. The left eye  Has nystagmus with gaze to the right and vice versa.  No Diplopia. Visual fields by finger perimetry are intact. Hearing was  intact to tuning fork testing.   Facial sensation intact to fine touch.  Facial motor strength is symmetric and tongue and uvula move midline.  Neck ROM : rotation, tilt and flexion extension were normal for age and shoulder shrug was symmetrical.    Motor exam:  Symmetric bulk, tone and ROM.   Normal tone without cog wheeling, symmetric grip strength .   Sensory:  Fine touch, pinprick and vibration were tested  - vibration was not felt at either knee.  Proprioception tested in the upper extremities was normal.   Coordination: Rapid alternating movements in the fingers/hands were of abnormal speed.  The Finger-to-nose maneuver was impaired  with evidence of ataxia, dysmetria - not tremor . Severe ataxia.  Gait and station: Patient could not rise unassisted from a seated position,  Wheelchair. She was able to perform heel-to-shin maneuver.   Deep tendon reflexes: in the upper and lower extremities are symmetric and intact.  Babinski response was deferred.  1)  The MRI did not confirm any CVA - this was not a stroke, but a Seizure - with status epilepticus as described by neurology hospitalist in ED.Brain: No acute infarction, hemorrhage, hydrocephalus, extra-axial collection or mass lesion.  Remote lacunar infarct in the right basal ganglia region. Scattered foci of T2 hyperintensity are seen within the white matter of the cerebral hemispheres and within the pons, nonspecific, most likely related to chronic small vessel ischemia.  Multiple foci of susceptibility artifact are noted in the bilateral cerebral hemispheres, more pronounced in the left occipital region where there is associated subtle T2 hyperintensity of the subcortical white matter and some of the foci appear to be cortical. No focus of abnormal contrast enhancement.  Vascular: Normal flow voids.  Skull and upper cervical spine: Normal marrow signal.  Sinuses/Orbits: Bilateral lens surgery. Paranasal sinuses  are essentially clear.  IMPRESSION: 1. Multiple foci of susceptibility artifact in the bilateral cerebral hemispheres, more pronounced in the left occipital region where there is associated subtle T2 hyperintensity of the subcortical white matter and some of the foci appear to be cortical. Findings may represent chronic microhemorrhages related to hypertension or amyloid angiopathy. 2. Remote lacunar infarct in the right basal ganglia region. 3. Scattered foci of T2 hyperintensity within the white matter of the cerebral hemispheres and pons, nonspecific, most likely related to chronic small vessel ischemia.   Electronically Signed By: Pedro Earls M.D. On: 11/26/2020 15:25    After spending a total time of 60 minutes face to face and additional time for physical and neurologic examination, review of laboratory studies,  personal review of imaging studies, reports and results of other testing and review of Francis information / records as far as provided in visit, I have established the following assessments:   1) seizure - Initial EEG was done under sedation.  FINDINGS:  2) No explanation about the ongoing swallowing and speech impairment- intubation related ??  3) memory has declined-  Chronic , slow progressing   40 upper extremity ataxia, dysmetria.   5)  Neuropathy , loss of vibration in both legs- numbness in your no gait evaluation today.     My Plan is to proceed with:  1) lets continue ST - speech and swallowing for now. thickened liquids and no dairy .   2) repeat EEG at [place]   3) continue 500 mg Keppra bid   4)  Increasing signs of dementia, patient presented in 2014 with confusion, and this has not step wise declined but continuously progressing, not step by step-  I would like to thank [name], MD and Alonza Bogus, DO, as well as referring provider   [facility], [address], [address] [place],  [place] [address] for allowing  me to meet with and to take care of this pleasant patient.   In short, Kristine Francis is presenting after a seizure that seems to have been leading in to status, she has dysphonia and dysphagia, and long standing memory lapses.   I plan to follow up either personally or through our NP within 4-6 month. MMSE and gait evaluation will be integrated into visit.    CC: I will share my notes with [place] [facility].  Electronically signed by: Larey Seat, MD 02/01/2021 2:32 PM  [facility] Board certified by The [employer] of Sleep Medicine and Diplomate of the [employer] of Sleep Medicine. Board certified In Neurology through the [place], Fellow  of the [employer] of Neurology. Medical Director of [employer].

## 2021-02-01 NOTE — Patient Instructions (Signed)
Seizure, Adult A seizure is a sudden burst of abnormal electrical and chemical activity in the brain. Seizures usually last from 30 seconds to 2 minutes.  What are the causes? Common causes of this condition include:  Fever or infection.  Problems that affect the brain. These may include: ? A brain or head injury. ? Bleeding in the brain. ? A brain tumor.  Low levels of blood sugar or salt.  Kidney problems or liver problems.  Conditions that are passed from parent to child (are inherited).  Problems with a substance, such as: ? Having a reaction to a drug or a medicine. ? Stopping the use of a substance all of a sudden (withdrawal).  A stroke.  Disorders that affect how you develop. Sometimes, the cause may not be known.  What increases the risk?  Having someone in your family who has epilepsy. In this condition, seizures happen again and again over time. They have no clear cause.  Having had a tonic-clonic seizure before. This type of seizure causes you to: ? Tighten the muscles of the whole body. ? Lose consciousness.  Having had a head injury or strokes before.  Having had a lack of oxygen at birth. What are the signs or symptoms? There are many types of seizures. The symptoms vary depending on the type of seizure you have. Symptoms during a seizure  Shaking that you cannot control (convulsions) with fast, jerky movements of muscles.  Stiffness of the body.  Breathing problems.  Feeling mixed up (confused).  Staring or not responding to sound or touch.  Head nodding.  Eyes that blink, flutter, or move fast.  Drooling, grunting, or making clicking sounds with your mouth  Losing control of when you pee or poop. Symptoms before a seizure  Feeling afraid, nervous, or worried.  Feeling like you may vomit.  Feeling like: ? You are moving when you are not. ? Things around you are moving when they are not.  Feeling like you saw or heard something before  (dj vu).  Odd tastes or smells.  Changes in how you see. You may see flashing lights or spots. Symptoms after a seizure  Feeling confused.  Feeling sleepy.  Headache.  Sore muscles. How is this treated? If your seizure stops on its own, you will not need treatment. If your seizure lasts longer than 5 minutes, you will normally need treatment. Treatment may include:  Medicines given through an IV tube.  Avoiding things, such as medicines, that are known to cause your seizures.  Medicines to prevent seizures.  A device to prevent or control seizures.  Surgery.  A diet low in carbohydrates and high in fat (ketogenic diet). Follow these instructions at home: Medicines  Take over-the-counter and prescription medicines only as told by your doctor.  Avoid foods or drinks that may keep your medicine from working, such as alcohol. Activity  Follow instructions about driving, swimming, or doing things that would be dangerous if you had another seizure. Wait until your doctor says it is safe for you to do these things.  If you live in the [place] ask your local department of motor vehicles when you can drive.  Get a lot of rest. Teaching others  Teach friends and family what to do when you have a seizure. They should: ? Help you get down to the ground. ? Protect your head and body. ? Loosen any clothing around your neck. ? Turn you on your side. ? Know whether or not   you need emergency care. ? Stay with you until you are better.  Also, tell them what not to do if you have a seizure. Tell them: ? They should not hold you down. ? They should not put anything in your mouth.   General instructions  Avoid anything that gives you seizures.  Keep a seizure diary. Write down: ? What you remember about each seizure. ? What you think caused each seizure.  Keep all follow-up visits. Contact a doctor if:  You have another seizure or seizures. Call the doctor each time you  have a seizure.  The pattern of your seizures changes.  You keep having seizures with treatment.  You have symptoms of being sick or having an infection.  You are not able to take your medicine. Get help right away if:  You have any of these problems: ? A seizure that lasts longer than 5 minutes. ? Many seizures in a row and you do not feel better between seizures. ? A seizure that makes it harder to breathe. ? A seizure and you can no longer speak or use part of your body.  You do not wake up right after a seizure.  You get hurt during a seizure.  You feel confused or have pain right after a seizure. These symptoms may be an emergency. Get help right away. Call your local emergency services (911 in the [place]  Do not wait to see if the symptoms will go away.  Do not drive yourself to the hospital. Summary  A seizure is a sudden burst of abnormal electrical and chemical activity in the brain. Seizures normally last from 30 seconds to 2 minutes.  Causes of seizures include illness, injury to the head, low levels of blood sugar or salt, and certain conditions.  Most seizures will stop on their own in less than 5 minutes. Seizures that last longer than 5 minutes are a medical emergency and need treatment right away.  Many medicines are used to treat seizures. Take over-the-counter and prescription medicines only as told by your doctor. This information is not intended to replace advice given to you by your health care provider. Make sure you discuss any questions you have with your health care provider. Document Revised: 05/15/2020 Document Reviewed: 05/15/2020 Elsevier Patient Education  2021 [employer]  

## 2021-02-01 NOTE — Addendum Note (Signed)
Addended by: Larey Seat on: 02/01/2021 03:24 PM   Modules accepted: Orders

## 2021-02-02 ENCOUNTER — Encounter: Payer: Self-pay | Admitting: Speech Pathology

## 2021-02-02 ENCOUNTER — Other Ambulatory Visit: Payer: Self-pay

## 2021-02-02 ENCOUNTER — Ambulatory Visit: Payer: Medicare Other

## 2021-02-02 ENCOUNTER — Ambulatory Visit: Payer: Medicare Other | Admitting: Speech Pathology

## 2021-02-02 ENCOUNTER — Telehealth: Payer: Self-pay | Admitting: Neurology

## 2021-02-02 ENCOUNTER — Ambulatory Visit: Payer: Medicare Other | Admitting: Occupational Therapy

## 2021-02-02 DIAGNOSIS — G9340 Encephalopathy, unspecified: Secondary | ICD-10-CM

## 2021-02-02 DIAGNOSIS — R260 Ataxic gait: Secondary | ICD-10-CM | POA: Diagnosis not present

## 2021-02-02 DIAGNOSIS — R270 Ataxia, unspecified: Secondary | ICD-10-CM

## 2021-02-02 DIAGNOSIS — R41840 Attention and concentration deficit: Secondary | ICD-10-CM

## 2021-02-02 DIAGNOSIS — G40409 Other generalized epilepsy and epileptic syndromes, not intractable, without status epilepticus: Secondary | ICD-10-CM | POA: Diagnosis not present

## 2021-02-02 DIAGNOSIS — M6281 Muscle weakness (generalized): Secondary | ICD-10-CM

## 2021-02-02 DIAGNOSIS — R41841 Cognitive communication deficit: Secondary | ICD-10-CM

## 2021-02-02 DIAGNOSIS — R278 Other lack of coordination: Secondary | ICD-10-CM

## 2021-02-02 DIAGNOSIS — R2681 Unsteadiness on feet: Secondary | ICD-10-CM | POA: Diagnosis not present

## 2021-02-02 DIAGNOSIS — R2689 Other abnormalities of gait and mobility: Secondary | ICD-10-CM | POA: Diagnosis not present

## 2021-02-02 NOTE — Therapy (Signed)
[place] [place], [place], [address] Phone: [phone]   Fax:  [phone]  Physical Therapy Treatment  Patient Details  Name: Kristine Francis MRN: [id number] Date of Birth: 1941/04/29 Referring Provider (PT): Cathlyn Parsons, PA-C   Encounter Date: 02/02/2021   PT End of Session - 02/02/21 1436    Visit Number 8    Number of Visits 17    Date for PT Re-Evaluation 03/01/21    Authorization Type Medicare Primary; [employer] Secondary; 10th visit PN    PT Start Time 1400    PT Stop Time 1440    PT Time Calculation (min) 40 min    Equipment Utilized During Treatment Gait belt    Activity Tolerance Patient tolerated treatment well    Behavior During Therapy [facility] for tasks assessed/performed           Past Medical History:  Diagnosis Date  . Allergy   . Asthma    sees Dr. Tiajuana Amass - as child per pt  . At high risk for falls    unstable gait   . Ataxia   . Cataract    removed bilaterally   . Diverticulosis   . Emphysema   . Gait abnormality 05/14/2013   Ms.Gherardi a patient of Dr. Linda Hedges presented first interpreter thousand 13 with a gait dysfunction, she also had an episodic confusion and Loss device. Exam found a mild nystagmus and she was referred to ophthalmology on 03-2812, brain MRI was normal nystagmus was a secondary diagnosis to vestibulitis, ear nose and throat has followed and had seen the patient. At the time of the nystagmus the patie  . GERD (gastroesophageal reflux disease)   . Hepatic steatosis   . Hyperlipidemia    on atorvastatin   . Hypertension    on losartan   . Iron deficiency anemia, unspecified   . Kidney cysts 11/16/12   small cyst on left  . Nystagmus, end-position   . Osteopenia   . Renal cyst   . Stroke ([place]) 01/2017   mild     Past Surgical History:  Procedure Laterality Date  . ANAL RECTAL MANOMETRY N/A 12/20/2017   Procedure: ANO RECTAL MANOMETRY;  Surgeon:  Mauri Pole, MD;  Location: [facility];  Service: Endoscopy;  Laterality: N/A;  . BREAST BIOPSY Bilateral 1970's  . CATARACT EXTRACTION Bilateral LT:02/14/13,RT:02/21/13  . CERVICAL SPINE SURGERY  2010  . COLONOSCOPY  03-12-14   per Dr. Olevia Perches, diverticulosis only, repeat 5 yrs   . EYE SURGERY Bilateral 01/2013&02/2013   left then right  . HERNIA REPAIR  Oct '14-left, remote-right   years ago right; left done '14  . SKIN CANCER EXCISION  05/13/13   FACE, basal cell  . TONSILLECTOMY      There were no vitals filed for this visit.   Subjective Assessment - 02/02/21 1404    Subjective Saw neuro yesterday, husband reports something found with blood test but couldnt recall    Patient is accompained by: Family member    Pertinent History HTN, renal cyst, osteopenia, nystagmus, kidney cysts, iron deficiency anemia, hepatic steatosis, gait abnormality    Limitations Walking;Standing    Patient Stated Goals To walk without falling and to walk more normally    Currently in Pain? No/denies                             [facility] Adult PT Treatment/Exercise -  02/02/21 1402      Ambulation/Gait   Ambulation Distance (Feet) 300 Feet    Gait Comments gait with RW; CGA-minA. Widened BOS, mild ataxia, RW helps to stabilize. Cues for sequencing esp with turning to sit. Trialed 2 laps with 1# AW B with slight improvement in foot placement.      High Level Balance   High Level Balance Activities Side stepping;Backward walking   1 hand on elevated mat table, 1 HHA - gait belt with CGA/min A   High Level Balance Comments rhomberg no UE assist EO hip width apart vs NBOS CGA, walking fwd/bkwd, sidestepping, standing marches/ weight shifts with BUE support in walker x10      Knee/Hip Exercises: Seated   Other Seated Knee/Hip Exercises Seated trunk rotation  x10 B wit red pball to tap bones R/L    Marching Both;10 reps    Sit to Sand 2 sets;10 reps;without UE support   1 set with UE  support, 2 sets holding small red ball with CGA - 2 instance LOB needing min A to recover initially, ncreasing instability with more sit to stands, diminishe deccentric LE cotrol. .     Shoulder Exercises: Seated   Retraction Strengthening;Both;10 reps   5 seconds             PT Short Term Goals - 01/13/21 1511      PT SHORT TERM GOAL #1   Title Pt and caregiver/spouse independent with initiating initial HEP    Baseline spouse reports difficult to get pt to maintain attention for HEP; educated on providing visual cuing    Time 2    Period Weeks    Status On-going    Target Date 01/18/21      PT SHORT TERM GOAL #2   Title Pt will improve five time sit to stand from mat, no UE support to </= 15 seconds but with improved anterior weight shift and decreased posterior LOB    Baseline able to perform with no UE support but does demo lack of anterior weight shift and occasional posterior LOB    Time 4    Period Weeks    Status On-going    Target Date 02/01/21      PT SHORT TERM GOAL #3   Title Pt will demo improved static standing balance to at least 1 minute with close supervision and minimal sway, without LOB    Baseline ~20 sec this rx    Time 4    Period Weeks    Status On-going    Target Date 02/01/21      PT SHORT TERM GOAL #4   Title Pt and caregiver will demo good understanding of falls prevention and safe functional mobility within the home to prevent falls    Time 2    Period Weeks    Status On-going    Target Date 01/18/21             PT Long Term Goals - 01/04/21 1640      PT LONG TERM GOAL #1   Title Pt will demonstrate independence with final HEP    Time 8    Period Weeks    Status New    Target Date 03/01/21      PT LONG TERM GOAL #2   Title Pt will improve five time sit to stand without use of UE to </= 13 seconds and no posterior LOB    Time 8    Period Weeks  Status New    Target Date 03/01/21      PT LONG TERM GOAL #3   Title Pt will  demo BLE strength at least 4+/5    Time 8    Period Weeks    Status New    Target Date 03/01/21      PT LONG TERM GOAL #4   Title Pt will improve BERG by at least 10  points to indicate decreased falls risk    Baseline 24/56    Time 8    Period Weeks    Status New    Target Date 03/01/21      PT LONG TERM GOAL #5   Title Pt will ambulate and complete direction changes with least restrictive assistive device safely without LOB to facilitate improved safety with community negotiation    Time 8    Period Weeks    Status New    Target Date 03/01/21                 Plan - 02/02/21 1444    Clinical Impression Statement Becky with continued limited carryover from sesssion to session but does fairly when given cues for recall with safe sequencing for transfers to/from RW. Increased instability noted today with increased reps sit to stand- no reports of dizziness but definite instability/sway with more repetitions of exercises. Trialed gait training with light 1# AWs today with slight improvement observed with steadiness of step placement but no consistent carryovver when they are off. She will benefit from continued balance/gait and endurance training    Personal Factors and Comorbidities Comorbidity 3+    Comorbidities HTN, renal cyst, osteopenia, nystagmus, kidney cysts, iron deficiency anemia, hepatic steatosis, gait abnormality.    Examination-Activity Limitations Locomotion Level;Transfers;Stairs;Toileting    Examination-Participation Restrictions Church;Community Activity;Cleaning    Rehab Potential Fair    PT Frequency 2x / week    PT Duration 8 weeks    PT Treatment/Interventions ADLs/Self Care Home Management;Aquatic Therapy;DME Instruction;Gait training;Stair training;Functional mobility training;Therapeutic activities;Therapeutic exercise;Balance training;Neuromuscular re-education;Patient/family education;Vestibular;Visual/perceptual remediation/compensation;Manual  techniques;Taping    PT Next Visit Plan Progressive TE and balance/neurore-ed exercises as tolerated. Frequent cues for safety. Standing balance/vestibular/multi-sensory balance activities, postural exercises, dynamic balance/ balance recovery.    Consulted and Agree with Plan of Care Patient;Family member/caregiver    Family Member Consulted husband           Patient will benefit from skilled therapeutic intervention in order to improve the following deficits and impairments:  Abnormal gait,Decreased balance,Decreased coordination,Decreased strength,Difficulty walking,Impaired tone,Impaired UE functional use,Impaired vision/preception,Postural dysfunction,Decreased mobility,Decreased safety awareness,Decreased endurance  Visit Diagnosis: Unsteadiness on feet  Other abnormalities of gait and mobility  Ataxic gait  Grand mal seizure ([place])  Other lack of coordination  Muscle weakness (generalized)  Encephalopathy     Problem List Patient Active Problem List   Diagnosis Date Noted  . PAF (paroxysmal atrial fibrillation) ([place]) 12/30/2020  . Heme + stool 12/27/2020  . Hyponatremia 12/27/2020  . Anemia 12/27/2020  . Protein-calorie malnutrition, severe 12/16/2020  . Encephalopathy 12/10/2020  . Grand mal seizure ([place]) 12/02/2020  . COPD (chronic obstructive pulmonary disease) ([place]) 12/02/2020  . Malnutrition of moderate degree 11/28/2020  . Acute encephalopathy   . Acute respiratory failure with hypoxia ([place])   . Aspiration into airway   . Cardiac arrest ([place])   . Groin mass 08/20/2020  . Falls infrequently 03/27/2019  . Sensory neuropathy 02/01/2019  . 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 Cerebellar ataxia ([place]) 08/23/2018  . Sensory polyneuropathy  08/23/2018  . Incontinence of feces   . Visual disturbances 02/06/2017  . Essential hypertension 11/07/2016  . Hyperlipidemia 04/24/2015  . Dysuria 02/11/2015  . Acute cystitis without hematuria 02/11/2015  . Chest pain  02/06/2015  . Left inguinal hernia 08/02/2013  . Gait abnormality 05/14/2013  . Gait disorder   . Nystagmus, end-position   . Routine health maintenance 12/13/2011  . LEG CRAMPS, NOCTURNAL 12/07/2010  . CALLUS, TOE 03/19/2010  . hip pain 03/19/2010  . ATAXIA 11/17/2008  . CERVICAL RADICULOPATHY, RIGHT 10/21/2008  . DIVERTICULOSIS OF COLON 08/14/2008  . RENAL CYST 08/14/2008  . ANEMIA-IRON DEFICIENCY 06/16/2007  . Asthma 06/16/2007  . GERD 06/16/2007  . OSTEOPENIA 06/16/2007    Hall Busing, PT, DPT 02/02/2021, 3:27 PM  [place]. [place], [place], [address] Phone: [phone]   Fax:  [phone]  Name: Kristine Francis MRN: [id number] Date of Birth: 08-Apr-1941

## 2021-02-02 NOTE — Telephone Encounter (Signed)
-----   Message from Larey Seat, MD sent at 02/02/2021 12:07 PM EDT ----- Normal TSH in one month ( anemia  still present ) .

## 2021-02-02 NOTE — Therapy (Signed)
[place]. [place], [place], [address] Phone: [phone]   Fax:  [phone]  Speech Language Pathology Treatment  Patient Details  Name: Kristine Francis MRN: [id number] Date of Birth: [birth year] No data recorded  Encounter Date: 02/02/2021   End of Session - 02/02/21 1606    Visit Number 8    Number of Visits 25    Date for SLP Re-Evaluation 04/03/21    SLP Start Time 1530    SLP Stop Time  1615    SLP Time Calculation (min) 45 min    Activity Tolerance Patient tolerated treatment well           Past Medical History:  Diagnosis Date   Allergy    Asthma    sees Dr. Tiajuana Amass - as child per pt   At high risk for falls    unstable gait    Ataxia    Cataract    removed bilaterally    Diverticulosis    Emphysema    Gait abnormality 05/14/2013   Ms.Spangle a patient of Dr. Linda Hedges presented first interpreter thousand 13 with a gait dysfunction, she also had an episodic confusion and Loss device. Exam found a mild nystagmus and she was referred to ophthalmology on 03-2812, brain MRI was normal nystagmus was a secondary diagnosis to vestibulitis, ear nose and throat has followed and had seen the patient. At the time of the nystagmus the patie   GERD (gastroesophageal reflux disease)    Hepatic steatosis    Hyperlipidemia    on atorvastatin    Hypertension    on losartan    Iron deficiency anemia, unspecified    Kidney cysts 11/16/12   small cyst on left   Nystagmus, end-position    Osteopenia    Renal cyst    Stroke ([place]) 01/2017   mild     Past Surgical History:  Procedure Laterality Date   ANAL RECTAL MANOMETRY N/A 12/20/2017   Procedure: ANO RECTAL MANOMETRY;  Surgeon: Mauri Pole, MD;  Location: [facility];  Service: Endoscopy;  Laterality: N/A;   BREAST BIOPSY Bilateral 1970's   CATARACT EXTRACTION Bilateral LT:02/14/13,RT:02/21/13   CERVICAL SPINE SURGERY  2010    COLONOSCOPY  03-12-14   per Dr. Olevia Perches, diverticulosis only, repeat 5 yrs    EYE SURGERY Bilateral 01/2013&02/2013   left then right   HERNIA REPAIR  Oct '14-left, remote-right   years ago right; left done '14   SKIN CANCER EXCISION  05/13/13   FACE, basal cell   TONSILLECTOMY      There were no vitals filed for this visit.   Subjective Assessment - 02/02/21 1534    Subjective "I am feeling okay."    Patient is accompained by: Family member    Currently in Pain? No/denies                 ADULT SLP TREATMENT - 02/02/21 0001      General Information   Behavior/Cognition Alert;Cooperative;Pleasant mood;Requires cueing      Treatment Provided   Treatment provided Cognitive-Linquistic      Cognitive-Linquistic Treatment   Treatment focused on Cognition    Skilled Treatment Memory strategies addressed. Pt and pt family report that she is not using thickener at home or completing exercises. SLP to speak with pt and pt family regarding compliance.      Assessment / Recommendations / Plan   Plan Continue with current plan of care  Progression Toward Goals   Progression toward goals Not progressing toward goals (comment)              SLP Short Term Goals - 02/02/21 1604      SLP SHORT TERM GOAL #1   Title Patient will tell SLP 3 areas of deficit/challenge for her given 2 verbal cues.    Time 3    Period Weeks    Status On-going      SLP SHORT TERM GOAL #2   Title Patient will recall personally-relevant information post >5 minute delay.    Baseline <5 min    Time 3    Period Weeks    Status On-going      SLP SHORT TERM GOAL #3   Title The patient will complete swallowing maneuvers (supraglottic swallow, Mendelson maneuver, effortful swallow, etc.) to improve oral motor weakness, tongue base retraction, hyolaryngeal excursion, airway protection, and clearance of the bolus through the pharynx with moderate verbal, visual and tactile cues.    Time 3     Period Weeks    Status On-going            SLP Long Term Goals - 02/02/21 1604      SLP LONG TERM GOAL #1   Title Client will utilize compensatory strategies with optimum safety and efficiency of swallowing function of P.O. intake without overt signs and symptoms of aspiration for the highest appropriate diet level.    Baseline honey thick/dysphagia 2/minced and moist    Time 9    Period Weeks    Status On-going      SLP LONG TERM GOAL #2   Title Patient will demonstrate use of memory strategies to schedule activities, recall weekly events and items to maintain safety to participate socially in functional living environment    Time 9    Period Weeks    Status On-going      SLP LONG TERM GOAL #3   Title Patient will demonstrate use of information processing and self monitoring during daily living activities to improve safety and awareness in functional living environment    Time 9    Period Weeks    Status On-going      SLP LONG TERM GOAL #4   Title Patient will develop functional attention skills to effectively attend to and communicate in tasks of daily living in their functional living environment.    Time 9    Period Weeks    Status On-going             Patient will benefit from skilled therapeutic intervention in order to improve the following deficits and impairments:   Cognitive communication deficit    Problem List Patient Active Problem List   Diagnosis Date Noted   PAF (paroxysmal atrial fibrillation) ([place]) 12/30/2020   Heme + stool 12/27/2020   Hyponatremia 12/27/2020   Anemia 12/27/2020   Protein-calorie malnutrition, severe 12/16/2020   Encephalopathy 12/10/2020   Grand mal seizure ([place]) 12/02/2020   COPD (chronic obstructive pulmonary disease) ([place]) 12/02/2020   Malnutrition of moderate degree 11/28/2020   Acute encephalopathy    Acute respiratory failure with hypoxia ([place])    Aspiration into airway    Cardiac arrest ([place])     Groin mass 08/20/2020   Falls infrequently 03/27/2019   Sensory neuropathy 02/01/2019   Luetscher's syndrome 08/23/2018   Cerebellar ataxia ([place]) 08/23/2018   Sensory polyneuropathy 08/23/2018   Incontinence of feces    Visual disturbances 02/06/2017   Essential hypertension  11/07/2016   Hyperlipidemia 04/24/2015   Dysuria 02/11/2015   Acute cystitis without hematuria 02/11/2015   Chest pain 02/06/2015   Left inguinal hernia 08/02/2013   Gait abnormality 05/14/2013   Gait disorder    Nystagmus, end-position    Routine health maintenance 12/13/2011   LEG CRAMPS, NOCTURNAL 12/07/2010   CALLUS, TOE 03/19/2010   hip pain 03/19/2010   ATAXIA 11/17/2008   CERVICAL RADICULOPATHY, RIGHT 10/21/2008   DIVERTICULOSIS OF COLON 08/14/2008   RENAL CYST 08/14/2008   ANEMIA-IRON DEFICIENCY 06/16/2007   Asthma 06/16/2007   GERD 06/16/2007   OSTEOPENIA 06/16/2007    Rosann Auerbach Meyer MS, [place], CBIS  02/02/2021, 4:07 PM  [place]. [place], [place], [address] Phone: [phone]   Fax:  [phone]   Name: Kristine Francis MRN: [id number] Date of Birth: 06-Sep-1941

## 2021-02-02 NOTE — Progress Notes (Signed)
Normal TSH in one month ( anemia  still present ) .

## 2021-02-02 NOTE — Telephone Encounter (Signed)
Called the patient to review the lab results, there was no answer and no VM set up.  Will try back later.  **If pt returns call please advise that there are still some results pending but the other results overall have come back normal range. Anemia is still present.  If any of the pending labs come back abnormal, I will call and review with patient.

## 2021-02-03 NOTE — Therapy (Signed)
[place]. [place], [place], [address] Phone: [phone]   Fax:  [phone]  Occupational Therapy Treatment  Patient Details  Name: Kristine Francis MRN: [id number] Date of Birth: [birth year] Referring Provider (OT): Lauraine Rinne, PA-C   Encounter Date: 02/02/2021   OT End of Session - 02/02/21 1700    Visit Number 8    Number of Visits 13    Date for OT Re-Evaluation 02/22/21    Authorization Type Medicare    Progress Note Due on Visit 10    OT Start Time 1445    OT Stop Time 1530    OT Time Calculation (min) 45 min    Activity Tolerance Patient tolerated treatment well    Behavior During Therapy [facility] for tasks assessed/performed   Confusion          Past Medical History:  Diagnosis Date  . Allergy   . Asthma    sees Dr. Tiajuana Amass - as child per pt  . At high risk for falls    unstable gait   . Ataxia   . Cataract    removed bilaterally   . Diverticulosis   . Emphysema   . Gait abnormality 05/14/2013   Ms.Hole a patient of Dr. Linda Hedges presented first interpreter thousand 13 with a gait dysfunction, she also had an episodic confusion and Loss device. Exam found a mild nystagmus and she was referred to ophthalmology on 03-2812, brain MRI was normal nystagmus was a secondary diagnosis to vestibulitis, ear nose and throat has followed and had seen the patient. At the time of the nystagmus the patie  . GERD (gastroesophageal reflux disease)   . Hepatic steatosis   . Hyperlipidemia    on atorvastatin   . Hypertension    on losartan   . Iron deficiency anemia, unspecified   . Kidney cysts 11/16/12   small cyst on left  . Nystagmus, end-position   . Osteopenia   . Renal cyst   . Stroke ([place]) 01/2017   mild     Past Surgical History:  Procedure Laterality Date  . ANAL RECTAL MANOMETRY N/A 12/20/2017   Procedure: ANO RECTAL MANOMETRY;  Surgeon: Mauri Pole, MD;  Location: [facility];   Service: Endoscopy;  Laterality: N/A;  . BREAST BIOPSY Bilateral 1970's  . CATARACT EXTRACTION Bilateral LT:02/14/13,RT:02/21/13  . CERVICAL SPINE SURGERY  2010  . COLONOSCOPY  03-12-14   per Dr. Olevia Perches, diverticulosis only, repeat 5 yrs   . EYE SURGERY Bilateral 01/2013&02/2013   left then right  . HERNIA REPAIR  Oct '14-left, remote-right   years ago right; left done '14  . SKIN CANCER EXCISION  05/13/13   FACE, basal cell  . TONSILLECTOMY      There were no vitals filed for this visit.   Subjective Assessment - 02/02/21 1648    Subjective  Pt's spouse reports pt is receiving a "memory test" in April    Patient is accompanied by: Family member   [name] (husband)   Pertinent History CVA (March 2018), ataxia, COPD, osteopenia, neuropathy, gait abnormality, asthma, iron deficiency anemia, GERD, and hx nystagmus    Patient Stated Goals Get back to being independent; play tennis again    Currently in Pain? No/denies            OT Treatments/Exercises (OP) - 02/02/21 1705      Cognitive Exercises   Sequencing Skills Sequencing activity requiring pt to place cards w/ tasks written  out into the correct order; pt able to complete 4-step, 5-step, and 6-step sequencing in the correct order w/ Mod I (extra time) approx 80% of the time, requiring Min A and verbal cues for the remainder      Hand Exercises   Other Hand Exercises Hand exerciser used to pick up 1" blocks and place them into bowl in front of pt to facilitate bilateral hand strength and function forward reach; completed 15x w/ each UE      Fine Motor Coordination (Hand/Wrist)   Manipulation of small objects Pt instructed to retrieve standard marbles and placed them on pegs organized by color to facilitate visual-perception and [place]; OT graded activity to incorporate in-hand manipulation of marbles. Pt able to locate all targets and correctly place marbles using index finger and thumb with minimal droppage   OT also adjusted positioning  for pt's retrieval or marbles to improve prehension pattern           OT Short Term Goals - 02/02/21 1703      OT SHORT TERM GOAL #1   Title Pt will be able to complete initial HEP with Min A from caregiver    Baseline No current HEP    Time 3    Period Weeks    Status Partially Met   02/03/21 - pt is able to complete initial HEP but spouse reports non-compliance at home   Target Date 01/26/21      OT SHORT TERM GOAL #2   Title Pt will be able to complete asymmetrical bilateral coordination activities independently in at least 3/4 trials to improve functional UE use during ADLs.    Baseline Decreased BUE GM coordination    Time 3    Period Weeks    Status On-going            OT Long Term Goals - 01/14/21 0819      OT LONG TERM GOAL #1   Title Pt will complete HEP designed for BUE strength and coordination with Mod I and report carryover to home    Baseline No current HEP    Time 6    Period Weeks    Status On-going      OT LONG TERM GOAL #2   Title Pt will demonstrate preparing a simple meal safely with Min A    Baseline Currently unable to prepare meals safely    Time 6    Period Weeks    Status On-going      OT LONG TERM GOAL #3   Title Pt will be able to safely complete UB/LB dressing with Mod I at least 75% of the time    Baseline Currently SPV/SBA for dressing    Time 6    Period Weeks    Status On-going      OT LONG TERM GOAL #4   Title Pt will be able to manipulate clothing fasteners without assistance in at least 4/5 trials    Baseline Decreased [place] as evidenced by time on 9-Hole Peg Test    Time 6    Period Weeks    Status On-going      OT LONG TERM GOAL #5   Title Pt will verbalize understanding of at least 3 compensatory/safety strategies to improve safety with ADLs and functional recall    Baseline Decreased safety awareness and recall; no current compensatory strategies for ADLs    Time 6    Period Weeks    Status On-going  Plan - 02/02/21 1702    Clinical Impression Statement Pt was able to complete visual-based sequencing card activity w/ fair accuracy and Mod I overall; trial of functional sequencing activity indicated. Memory and ataxia continue to be limiting during therapeutic activities, but pt demo'd slightly improved [place] during marble targeting activity this session.    OT Occupational Profile and History Detailed Assessment- Review of Records and additional review of physical, cognitive, psychosocial history related to current functional performance    Occupational performance deficits (Please refer to evaluation for details): ADL's;IADL's;Leisure    Body Structure / Function / Physical Skills ADL;Decreased knowledge of precautions;ROM;UE functional use;FMC;Dexterity;Body mechanics;Decreased knowledge of use of DME;Balance;Vision;GMC;Pain;Strength;IADL;Coordination    Cognitive Skills Attention;Problem Solve;Safety Awareness    Rehab Potential Good    Clinical Decision Making Several treatment options, min-mod task modification necessary    Comorbidities Affecting Occupational Performance: Presence of comorbidities impacting occupational performance    Modification or Assistance to Complete Evaluation  Min-Moderate modification of tasks or assist with assess necessary to complete eval    OT Frequency 2x / week    OT Duration 6 weeks    OT Treatment/Interventions Self-care/ADL training;Therapeutic exercise;Visual/perceptual remediation/compensation;Aquatic Therapy;Moist Heat;Neuromuscular education;Patient/family education;Energy conservation;Therapeutic activities;Fluidtherapy;Cryotherapy;DME and/or AE instruction;Manual Therapy;Passive range of motion;Cognitive remediation/compensation    Plan Functional IADLs; review energy conservation    Consulted and Agree with Plan of Care Patient;Family member/caregiver    Family Member Consulted Husband [name])           Patient will benefit from skilled  therapeutic intervention in order to improve the following deficits and impairments:   Body Structure / Function / Physical Skills: ADL,Decreased knowledge of precautions,ROM,UE functional use,FMC,Dexterity,Body mechanics,Decreased knowledge of use of DME,Balance,Vision,GMC,Pain,Strength,IADL,Coordination Cognitive Skills: Attention,Problem Solve,Safety Awareness     Visit Diagnosis: Ataxia  Muscle weakness (generalized)  Other lack of coordination  Attention and concentration deficit    Problem List Patient Active Problem List   Diagnosis Date Noted  . PAF (paroxysmal atrial fibrillation) ([place]) 12/30/2020  . Heme + stool 12/27/2020  . Hyponatremia 12/27/2020  . Anemia 12/27/2020  . Protein-calorie malnutrition, severe 12/16/2020  . Encephalopathy 12/10/2020  . Grand mal seizure ([place]) 12/02/2020  . COPD (chronic obstructive pulmonary disease) ([place]) 12/02/2020  . Malnutrition of moderate degree 11/28/2020  . Acute encephalopathy   . Acute respiratory failure with hypoxia ([place])   . Aspiration into airway   . Cardiac arrest ([place])   . Groin mass 08/20/2020  . Falls infrequently 03/27/2019  . Sensory neuropathy 02/01/2019  . 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 Cerebellar ataxia ([place]) 08/23/2018  . Sensory polyneuropathy 08/23/2018  . Incontinence of feces   . Visual disturbances 02/06/2017  . Essential hypertension 11/07/2016  . Hyperlipidemia 04/24/2015  . Dysuria 02/11/2015  . Acute cystitis without hematuria 02/11/2015  . Chest pain 02/06/2015  . Left inguinal hernia 08/02/2013  . Gait abnormality 05/14/2013  . Gait disorder   . Nystagmus, end-position   . Routine health maintenance 12/13/2011  . LEG CRAMPS, NOCTURNAL 12/07/2010  . CALLUS, TOE 03/19/2010  . hip pain 03/19/2010  . ATAXIA 11/17/2008  . CERVICAL RADICULOPATHY, RIGHT 10/21/2008  . DIVERTICULOSIS OF COLON 08/14/2008  . RENAL CYST 08/14/2008  . ANEMIA-IRON DEFICIENCY 06/16/2007  . Asthma 06/16/2007   . GERD 06/16/2007  . OSTEOPENIA 06/16/2007     Kathrine Cords, OTR/L, MSOT 02/03/2021, 5:19 PM  [place]. [place], [place], [address] Phone: [phone]   Fax:  [phone]  Name: Kristine Boissonneault  Francis MRN: [id number] Date of Birth: 1941/07/11

## 2021-02-04 ENCOUNTER — Encounter: Payer: Self-pay | Admitting: Occupational Therapy

## 2021-02-04 ENCOUNTER — Ambulatory Visit: Payer: Medicare Other | Admitting: Speech Pathology

## 2021-02-04 ENCOUNTER — Encounter: Payer: Self-pay | Admitting: Speech Pathology

## 2021-02-04 ENCOUNTER — Other Ambulatory Visit: Payer: Self-pay

## 2021-02-04 ENCOUNTER — Ambulatory Visit: Payer: Medicare Other | Admitting: Occupational Therapy

## 2021-02-04 ENCOUNTER — Ambulatory Visit: Payer: Medicare Other

## 2021-02-04 DIAGNOSIS — M6281 Muscle weakness (generalized): Secondary | ICD-10-CM | POA: Diagnosis not present

## 2021-02-04 DIAGNOSIS — G40409 Other generalized epilepsy and epileptic syndromes, not intractable, without status epilepticus: Secondary | ICD-10-CM | POA: Diagnosis not present

## 2021-02-04 DIAGNOSIS — R41841 Cognitive communication deficit: Secondary | ICD-10-CM

## 2021-02-04 DIAGNOSIS — R2689 Other abnormalities of gait and mobility: Secondary | ICD-10-CM

## 2021-02-04 DIAGNOSIS — R278 Other lack of coordination: Secondary | ICD-10-CM

## 2021-02-04 DIAGNOSIS — R2681 Unsteadiness on feet: Secondary | ICD-10-CM

## 2021-02-04 DIAGNOSIS — G9340 Encephalopathy, unspecified: Secondary | ICD-10-CM | POA: Diagnosis not present

## 2021-02-04 DIAGNOSIS — R270 Ataxia, unspecified: Secondary | ICD-10-CM

## 2021-02-04 DIAGNOSIS — R260 Ataxic gait: Secondary | ICD-10-CM | POA: Diagnosis not present

## 2021-02-04 DIAGNOSIS — R41840 Attention and concentration deficit: Secondary | ICD-10-CM

## 2021-02-04 NOTE — Therapy (Signed)
[place]. [place], [place], [address] Phone: [phone]   Fax:  [phone]  Speech Language Pathology Treatment  Patient Details  Name: Kristine Francis MRN: [id number] Date of Birth: 1941/01/20 No data recorded  Encounter Date: 02/04/2021   End of Session - 02/04/21 1542    Visit Number 9    Number of Visits 25    Date for SLP Re-Evaluation 04/03/21    SLP Start Time 1535    SLP Stop Time  1615    SLP Time Calculation (min) 40 min    Activity Tolerance Patient tolerated treatment well           Past Medical History:  Diagnosis Date  . Allergy   . Asthma    sees Dr. Tiajuana Amass - as child per pt  . At high risk for falls    unstable gait   . Ataxia   . Cataract    removed bilaterally   . Diverticulosis   . Emphysema   . Gait abnormality 05/14/2013   Kristine Francis a patient of Dr. Linda Hedges presented first interpreter thousand 13 with a gait dysfunction, she also had an episodic confusion and Loss device. Exam found a mild nystagmus and she was referred to ophthalmology on 03-2812, brain MRI was normal nystagmus was a secondary diagnosis to vestibulitis, ear nose and throat has followed and had seen the patient. At the time of the nystagmus the patie  . GERD (gastroesophageal reflux disease)   . Hepatic steatosis   . Hyperlipidemia    on atorvastatin   . Hypertension    on losartan   . Iron deficiency anemia, unspecified   . Kidney cysts 11/16/12   small cyst on left  . Nystagmus, end-position   . Osteopenia   . Renal cyst   . Stroke ([place]) 01/2017   mild     Past Surgical History:  Procedure Laterality Date  . ANAL RECTAL MANOMETRY N/A 12/20/2017   Procedure: ANO RECTAL MANOMETRY;  Surgeon: Mauri Pole, MD;  Location: [facility];  Service: Endoscopy;  Laterality: N/A;  . BREAST BIOPSY Bilateral 1970's  . CATARACT EXTRACTION Bilateral LT:02/14/13,RT:02/21/13  . CERVICAL SPINE SURGERY  2010   . COLONOSCOPY  03-12-14   per Dr. Olevia Perches, diverticulosis only, repeat 5 yrs   . EYE SURGERY Bilateral 01/2013&02/2013   left then right  . HERNIA REPAIR  Oct '14-left, remote-right   years ago right; left done '14  . SKIN CANCER EXCISION  05/13/13   FACE, basal cell  . TONSILLECTOMY      There were no vitals filed for this visit.   Subjective Assessment - 02/04/21 1542    Subjective "I love your socks!"    Patient is accompained by: Family member    Currently in Pain? No/denies                 ADULT SLP TREATMENT - 02/04/21 1553      General Information   Behavior/Cognition Alert;Cooperative;Pleasant mood;Requires cueing      Treatment Provided   Treatment provided Cognitive-Linquistic      Cognitive-Linquistic Treatment   Treatment focused on Cognition    Skilled Treatment Restarted memory journal today and explained the necessity/importance of using one at home. Spoke with husband about generating a list of things that he feels like she has trouble with at home. Decreasing frequency to x1 a week for maintenance.      Assessment / Recommendations / Plan  Plan Continue with current plan of care      Progression Toward Goals   Progression toward goals Progressing toward goals              SLP Short Term Goals - 02/04/21 1543      SLP SHORT TERM GOAL #1   Title Patient will tell SLP 3 areas of deficit/challenge for her given 2 verbal cues.    Time 3    Period Weeks    Status On-going      SLP SHORT TERM GOAL #2   Title Patient will recall personally-relevant information post >5 minute delay.    Baseline <5 min    Time 3    Period Weeks    Status On-going      SLP SHORT TERM GOAL #3   Title The patient will complete swallowing maneuvers (supraglottic swallow, Mendelson maneuver, effortful swallow, etc.) to improve oral motor weakness, tongue base retraction, hyolaryngeal excursion, airway protection, and clearance of the bolus through the pharynx with  moderate verbal, visual and tactile cues.    Time 3    Period Weeks    Status Deferred   Goal deferred due to lack of compliance.     SLP SHORT TERM GOAL #4   Title Pt and family will complete memory book page prior to coming to therapy to assist with memory of important information.    Time 4    Period Weeks    Status New            SLP Long Term Goals - 02/04/21 1543      SLP LONG TERM GOAL #1   Title Client will utilize compensatory strategies with optimum safety and efficiency of swallowing function of P.O. intake without overt signs and symptoms of aspiration for the highest appropriate diet level.    Baseline honey thick/dysphagia 2/minced and moist    Time 9    Period Weeks    Status On-going      SLP LONG TERM GOAL #2   Title Patient will demonstrate use of memory strategies to schedule activities, recall weekly events and items to maintain safety to participate socially in functional living environment    Time 9    Period Weeks    Status On-going      SLP LONG TERM GOAL #3   Title Patient will demonstrate use of information processing and self monitoring during daily living activities to improve safety and awareness in functional living environment    Time 9    Period Weeks    Status On-going      SLP LONG TERM GOAL #4   Title Patient will develop functional attention skills to effectively attend to and communicate in tasks of daily living in their functional living environment.    Time 9    Period Weeks    Status On-going            Plan - 02/04/21 1543    Clinical Impression Statement Pt is a 80 yo female with hx of encephalopathy 2/2 to seizures. Hx of intubation and ventilator use. Dysphagia. Dysarthria. Cognitive deficit. Pt was given the SLUMS which indicated a deficit in attention, short term memory, problem solving, and executive functioning skills. Pt husband is responsible for all ADLs due to significant memory deficits. Pt reports she would like to  be more self-sufficient with medications and her thinking skills. Previous documentation and MBS rec dysphagia 2 diet and honey thick liquids. SLP to address "minced and  moist (IDDSI)" terminology with patient and spouse. Oropharyngeal dysphagia to be addressed in treatment. Dysarthria noted in conversation with impaired articulation. Gurgly vocal quality noted. "Silent aspiration" addressed with patient and spouse. SLP rec skilled speech services to address cognitive-linguistic deficit, dysphagia, and dysarthria treatments to increase quality of life and participation. Adding [employer] **Decreasing frequency to x1/week to check in with memory strategies and maintenance.    Speech Therapy Frequency 1x /week    Duration 12 weeks    Treatment/Interventions Aspiration precaution training;Trials of upgraded texture/liquids;Compensatory strategies;Pharyngeal strengthening exercises;Oral motor exercises;Cueing hierarchy;Functional tasks;Patient/family education;Diet toleration management by SLP;Environmental controls;Cognitive reorganization;Compensatory techniques;Internal/external aids;SLP instruction and feedback    Potential to Achieve Goals Fair    Potential Considerations Ability to learn/carryover information    Consulted and Agree with Plan of Care Patient;Family member/caregiver    Family Member Consulted [name] (spouse)           Patient will benefit from skilled therapeutic intervention in order to improve the following deficits and impairments:   Cognitive communication deficit    Problem List Patient Active Problem List   Diagnosis Date Noted  . PAF (paroxysmal atrial fibrillation) ([place]) 12/30/2020  . Heme + stool 12/27/2020  . Hyponatremia 12/27/2020  . Anemia 12/27/2020  . Protein-calorie malnutrition, severe 12/16/2020  . Encephalopathy 12/10/2020  . Grand mal seizure ([place]) 12/02/2020  . COPD (chronic obstructive pulmonary disease) ([place]) 12/02/2020  .  Malnutrition of moderate degree 11/28/2020  . Acute encephalopathy   . Acute respiratory failure with hypoxia ([place])   . Aspiration into airway   . Cardiac arrest ([place])   . Groin mass 08/20/2020  . Falls infrequently 03/27/2019  . Sensory neuropathy 02/01/2019  . 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 Cerebellar ataxia ([place]) 08/23/2018  . Sensory polyneuropathy 08/23/2018  . Incontinence of feces   . Visual disturbances 02/06/2017  . Essential hypertension 11/07/2016  . Hyperlipidemia 04/24/2015  . Dysuria 02/11/2015  . Acute cystitis without hematuria 02/11/2015  . Chest pain 02/06/2015  . Left inguinal hernia 08/02/2013  . Gait abnormality 05/14/2013  . Gait disorder   . Nystagmus, end-position   . Routine health maintenance 12/13/2011  . LEG CRAMPS, NOCTURNAL 12/07/2010  . CALLUS, TOE 03/19/2010  . hip pain 03/19/2010  . ATAXIA 11/17/2008  . CERVICAL RADICULOPATHY, RIGHT 10/21/2008  . DIVERTICULOSIS OF COLON 08/14/2008  . RENAL CYST 08/14/2008  . ANEMIA-IRON DEFICIENCY 06/16/2007  . Asthma 06/16/2007  . GERD 06/16/2007  . OSTEOPENIA 06/16/2007    Verdene Lennert MS, [place], CBIS  02/04/2021, 4:15 PM  [place]. [place], [place], [address] Phone: [phone]   Fax:  [phone]   Name: JANEESE MCGLOIN MRN: [id number] Date of Birth: 1941-06-09

## 2021-02-04 NOTE — Therapy (Signed)
[place]. [place], [place], [address] Phone: [phone]   Fax:  [phone]  Occupational Therapy Treatment  Patient Details  Name: Kristine Francis MRN: [id number] Date of Birth: Dec 03, 1940 Referring Provider (OT): Lauraine Rinne, PA-C   Encounter Date: 02/04/2021   OT End of Session - 02/04/21 1453    Visit Number 9    Number of Visits 13    Date for OT Re-Evaluation 02/22/21    Authorization Type Medicare    Progress Note Due on Visit 10    OT Start Time 1445    OT Stop Time 1530    OT Time Calculation (min) 45 min    Activity Tolerance Patient tolerated treatment well    Behavior During Therapy [facility] for tasks assessed/performed   Confusion          Past Medical History:  Diagnosis Date  . Allergy   . Asthma    sees Dr. Tiajuana Amass - as child per pt  . At high risk for falls    unstable gait   . Ataxia   . Cataract    removed bilaterally   . Diverticulosis   . Emphysema   . Gait abnormality 05/14/2013   Ms.Villasenor a patient of Dr. Linda Hedges presented first interpreter thousand 13 with a gait dysfunction, she also had an episodic confusion and Loss device. Exam found a mild nystagmus and she was referred to ophthalmology on 03-2812, brain MRI was normal nystagmus was a secondary diagnosis to vestibulitis, ear nose and throat has followed and had seen the patient. At the time of the nystagmus the patie  . GERD (gastroesophageal reflux disease)   . Hepatic steatosis   . Hyperlipidemia    on atorvastatin   . Hypertension    on losartan   . Iron deficiency anemia, unspecified   . Kidney cysts 11/16/12   small cyst on left  . Nystagmus, end-position   . Osteopenia   . Renal cyst   . Stroke ([place]) 01/2017   mild     Past Surgical History:  Procedure Laterality Date  . ANAL RECTAL MANOMETRY N/A 12/20/2017   Procedure: ANO RECTAL MANOMETRY;  Surgeon: Mauri Pole, MD;  Location: [facility];   Service: Endoscopy;  Laterality: N/A;  . BREAST BIOPSY Bilateral 1970's  . CATARACT EXTRACTION Bilateral LT:02/14/13,RT:02/21/13  . CERVICAL SPINE SURGERY  2010  . COLONOSCOPY  03-12-14   per Dr. Olevia Perches, diverticulosis only, repeat 5 yrs   . EYE SURGERY Bilateral 01/2013&02/2013   left then right  . HERNIA REPAIR  Oct '14-left, remote-right   years ago right; left done '14  . SKIN CANCER EXCISION  05/13/13   FACE, basal cell  . TONSILLECTOMY      There were no vitals filed for this visit.   Subjective Assessment - 02/04/21 1642    Subjective  "You remind me so much of my old coworker, [name]"    Patient is accompanied by: Family member   [name] (husband)   Pertinent History CVA (March 2018), ataxia, COPD, osteopenia, neuropathy, gait abnormality, asthma, iron deficiency anemia, GERD, and hx nystagmus    Patient Stated Goals Get back to being independent; play tennis again    Currently in Pain? No/denies            OT Treatments/Exercises (OP) - 02/04/21 1651      Shoulder Exercises: Seated   Other Seated Exercises BUE chest press w/ 1lb dowel rod completed for  strengthening; completed 25x w/ OT providing tactile cue each rep to facilitate full elbow extension and moving through full ROM    Other Seated Exercises BUE overhead press w/ 1lb dowel rod completed for strengthening; completed 25x w/ OT providing occasional verbal cues to facilitate pt moving through full ROM      Visual/Perceptual Exercises   Word Finding Word search activity used to facilitate RUE coordination and visual-perception, as well as attention, working memory, and reasoning   Pt demo'd signiticant difficulty w/ initial word search, requiring verbal/visual cues and coaching; OT graded activity down, which improved success     Neurological Re-education Exercises   Other Weight-Bearing Exercises 1 Sitting EOM, pt reached down to L side w/ contralateral RUE to facilitate weight bearing through LUE, functional reaching,  and core activation. Completed 6x to each side w/out difficulty; OT instructed pt to avoid knocking cones over and ensure they were secure on the mat before releasing each cone, which improved pt's accuracy.    Weight-Shifting Exercises - Outside [place] of Support Sitting EOM, pt instructed to reach to retrieve cones from OT held at various low, medium, and high positions outside pt's BoS in order to facilitate core activation, weight-shifting, targeting, functional reach, and [place]            OT Short Term Goals - 02/02/21 1703      OT SHORT TERM GOAL #1   Title Pt will be able to complete initial HEP with Min A from caregiver    Baseline No current HEP    Time 3    Period Weeks    Status Partially Met   02/03/21 - pt is able to complete initial HEP but spouse reports non-compliance at home   Target Date 01/26/21      OT SHORT TERM GOAL #2   Title Pt will be able to complete asymmetrical bilateral coordination activities independently in at least 3/4 trials to improve functional UE use during ADLs.    Baseline Decreased BUE GM coordination    Time 3    Period Weeks    Status On-going            OT Long Term Goals - 01/14/21 0819      OT LONG TERM GOAL #1   Title Pt will complete HEP designed for BUE strength and coordination with Mod I and report carryover to home    Baseline No current HEP    Time 6    Period Weeks    Status On-going      OT LONG TERM GOAL #2   Title Pt will demonstrate preparing a simple meal safely with Min A    Baseline Currently unable to prepare meals safely    Time 6    Period Weeks    Status On-going      OT LONG TERM GOAL #3   Title Pt will be able to safely complete UB/LB dressing with Mod I at least 75% of the time    Baseline Currently SPV/SBA for dressing    Time 6    Period Weeks    Status On-going      OT LONG TERM GOAL #4   Title Pt will be able to manipulate clothing fasteners without assistance in at least 4/5 trials    Baseline  Decreased [place] as evidenced by time on 9-Hole Peg Test    Time 6    Period Weeks    Status On-going      OT LONG  TERM GOAL #5   Title Pt will verbalize understanding of at least 3 compensatory/safety strategies to improve safety with ADLs and functional recall    Baseline Decreased safety awareness and recall; no current compensatory strategies for ADLs    Time 6    Period Weeks    Status On-going            Plan - 02/04/21 1642    Clinical Impression Statement Pt demonstrated difficulty w/ organization of thought during word search activity; due to lack of success, pt graded down word search activity to simple 5-word word search, which improved pt's success. Pt required verbal cues, extra time, visual cueing, and coaching to complete activity successfully. Pt continues to fixate on cleaning activities at home, so OT incorporated UB strengthening and inquired further into pt's baseline prior to most recent hospitalization - per pt's spouse, memory deficits were premorbid, but need for w/c and difficulty w/ housekeeping tasks were not.    OT Occupational Profile and History Detailed Assessment- Review of Records and additional review of physical, cognitive, psychosocial history related to current functional performance    Occupational performance deficits (Please refer to evaluation for details): ADL's;IADL's;Leisure    Body Structure / Function / Physical Skills ADL;Decreased knowledge of precautions;ROM;UE functional use;FMC;Dexterity;Body mechanics;Decreased knowledge of use of DME;Balance;Vision;GMC;Pain;Strength;IADL;Coordination    Cognitive Skills Attention;Problem Solve;Safety Awareness    Rehab Potential Good    Clinical Decision Making Several treatment options, min-mod task modification necessary    Comorbidities Affecting Occupational Performance: Presence of comorbidities impacting occupational performance    Modification or Assistance to Complete Evaluation  Min-Moderate  modification of tasks or assist with assess necessary to complete eval    OT Frequency 2x / week    OT Duration 6 weeks    OT Treatment/Interventions Self-care/ADL training;Therapeutic exercise;Visual/perceptual remediation/compensation;Aquatic Therapy;Moist Heat;Neuromuscular education;Patient/family education;Energy conservation;Therapeutic activities;Fluidtherapy;Cryotherapy;DME and/or AE instruction;Manual Therapy;Passive range of motion;Cognitive remediation/compensation    Plan Functional IADLs; UB strengthening    Consulted and Agree with Plan of Care Patient;Family member/caregiver    Family Member Consulted Husband [name])           Patient will benefit from skilled therapeutic intervention in order to improve the following deficits and impairments:   Body Structure / Function / Physical Skills: ADL,Decreased knowledge of precautions,ROM,UE functional use,FMC,Dexterity,Body mechanics,Decreased knowledge of use of DME,Balance,Vision,GMC,Pain,Strength,IADL,Coordination Cognitive Skills: Attention,Problem Solve,Safety Awareness     Visit Diagnosis: Attention and concentration deficit  Ataxia  Muscle weakness (generalized)  Other lack of coordination    Problem List Patient Active Problem List   Diagnosis Date Noted  . PAF (paroxysmal atrial fibrillation) ([place]) 12/30/2020  . Heme + stool 12/27/2020  . Hyponatremia 12/27/2020  . Anemia 12/27/2020  . Protein-calorie malnutrition, severe 12/16/2020  . Encephalopathy 12/10/2020  . Grand mal seizure ([place]) 12/02/2020  . COPD (chronic obstructive pulmonary disease) ([place]) 12/02/2020  . Malnutrition of moderate degree 11/28/2020  . Acute encephalopathy   . Acute respiratory failure with hypoxia ([place])   . Aspiration into airway   . Cardiac arrest ([place])   . Groin mass 08/20/2020  . Falls infrequently 03/27/2019  . Sensory neuropathy 02/01/2019  . 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 Cerebellar ataxia ([place]) 08/23/2018  .  Sensory polyneuropathy 08/23/2018  . Incontinence of feces   . Visual disturbances 02/06/2017  . Essential hypertension 11/07/2016  . Hyperlipidemia 04/24/2015  . Dysuria 02/11/2015  . Acute cystitis without hematuria 02/11/2015  . Chest pain 02/06/2015  . Left inguinal hernia 08/02/2013  . Gait abnormality  05/14/2013  . Gait disorder   . Nystagmus, end-position   . Routine health maintenance 12/13/2011  . LEG CRAMPS, NOCTURNAL 12/07/2010  . CALLUS, TOE 03/19/2010  . hip pain 03/19/2010  . ATAXIA 11/17/2008  . CERVICAL RADICULOPATHY, RIGHT 10/21/2008  . DIVERTICULOSIS OF COLON 08/14/2008  . RENAL CYST 08/14/2008  . ANEMIA-IRON DEFICIENCY 06/16/2007  . Asthma 06/16/2007  . GERD 06/16/2007  . OSTEOPENIA 06/16/2007     Kathrine Cords, OTR/L, MSOT 02/04/2021, 5:08 PM  [place]. [place], [place], [address] Phone: [phone]   Fax:  [phone]  Name: AZANI BROGDON MRN: [id number] Date of Birth: 11-Jul-1941

## 2021-02-04 NOTE — Therapy (Signed)
[facility]. [place], [place], [address] Phone: [phone]   Fax:  [phone]  Physical Therapy Treatment  Patient Details  Name: Kristine Francis MRN: [id number] Date of Birth: 1941-04-13 Referring Provider (PT): Cathlyn Parsons, PA-C   Encounter Date: 02/04/2021   PT End of Session - 02/04/21 1531    Visit Number 9    Number of Visits 17    Date for PT Re-Evaluation 03/01/21    Authorization Type Medicare Primary; [employer] Secondary; 10th visit PN    PT Start Time 1400    PT Stop Time 1442    PT Time Calculation (min) 42 min    Equipment Utilized During Treatment Gait belt    Activity Tolerance Patient tolerated treatment well    Behavior During Therapy [facility] for tasks assessed/performed           Past Medical History:  Diagnosis Date  . Allergy   . Asthma    sees Dr. Tiajuana Amass - as child per pt  . At high risk for falls    unstable gait   . Ataxia   . Cataract    removed bilaterally   . Diverticulosis   . Emphysema   . Gait abnormality 05/14/2013   Ms.Gaber a patient of Dr. Linda Hedges presented first interpreter thousand 13 with a gait dysfunction, she also had an episodic confusion and Loss device. Exam found a mild nystagmus and she was referred to ophthalmology on 03-2812, brain MRI was normal nystagmus was a secondary diagnosis to vestibulitis, ear nose and throat has followed and had seen the patient. At the time of the nystagmus the patie  . GERD (gastroesophageal reflux disease)   . Hepatic steatosis   . Hyperlipidemia    on atorvastatin   . Hypertension    on losartan   . Iron deficiency anemia, unspecified   . Kidney cysts 11/16/12   small cyst on left  . Nystagmus, end-position   . Osteopenia   . Renal cyst   . Stroke ([place]) 01/2017   mild     Past Surgical History:  Procedure Laterality Date  . ANAL RECTAL MANOMETRY N/A 12/20/2017   Procedure: ANO RECTAL MANOMETRY;  Surgeon:  Mauri Pole, MD;  Location: [facility];  Service: Endoscopy;  Laterality: N/A;  . BREAST BIOPSY Bilateral 1970's  . CATARACT EXTRACTION Bilateral LT:02/14/13,RT:02/21/13  . CERVICAL SPINE SURGERY  2010  . COLONOSCOPY  03-12-14   per Dr. Olevia Perches, diverticulosis only, repeat 5 yrs   . EYE SURGERY Bilateral 01/2013&02/2013   left then right  . HERNIA REPAIR  Oct '14-left, remote-right   years ago right; left done '14  . SKIN CANCER EXCISION  05/13/13   FACE, basal cell  . TONSILLECTOMY      There were no vitals filed for this visit.   Subjective Assessment - 02/04/21 1405    Subjective No new complaints, no falls since last visit    Patient is accompained by: Family member    Pertinent History HTN, renal cyst, osteopenia, nystagmus, kidney cysts, iron deficiency anemia, hepatic steatosis, gait abnormality    Limitations Walking;Standing    Patient Stated Goals To walk without falling and to walk more normally    Currently in Pain? No/denies             [facility] Adult PT Treatment/Exercise - 02/04/21 0001      Transfers   Comments Sit <> stands with CGA to min A for  stbility on standing. Stand pivot transfers x 4 WC <> mat table min to mod A for stability without FWW (Gait belt and PT assist anterior).      Ambulation/Gait   Gait Comments gait with RW; CGA-minA. Widened BOS, mild ataxia, RW helps to stabilize. Cues for sequencing esp with turning to sit. Trialed 4 laps with 1# AW B with slight improvement in foot placement.      High Level Balance   High Level Balance Comments rhomberg no UE assist EO hip width apart vs NBOS CGA with intermttent min A with ball tosses x 2, standing marches/ weight shifts with BHHA x10      Knee/Hip Exercises: Seated   Other Seated Knee/Hip Exercises Seated trunk rotation  x10 B with red pball to tap cones R/L. Seated balance reactions with multidirectional reaches in sitting on dynadisc - work on balance reactions x 5 minutes    Marching Both;10  reps;2 sets    Marching Weights 1 lbs.   AWs   Sit to Sand 10 reps;without UE support;3 sets   first set holding small red med ball, last 2 sets with 4# weight around waist and upward reaches into standing. Multidirextional reaches/hig fives in standing.            PT Short Term Goals - 01/13/21 1511      PT SHORT TERM GOAL #1   Title Pt and caregiver/spouse independent with initiating initial HEP    Baseline spouse reports difficult to get pt to maintain attention for HEP; educated on providing visual cuing    Time 2    Period Weeks    Status On-going    Target Date 01/18/21      PT SHORT TERM GOAL #2   Title Pt will improve five time sit to stand from mat, no UE support to </= 15 seconds but with improved anterior weight shift and decreased posterior LOB    Baseline able to perform with no UE support but does demo lack of anterior weight shift and occasional posterior LOB    Time 4    Period Weeks    Status On-going    Target Date 02/01/21      PT SHORT TERM GOAL #3   Title Pt will demo improved static standing balance to at least 1 minute with close supervision and minimal sway, without LOB    Baseline ~20 sec this rx    Time 4    Period Weeks    Status On-going    Target Date 02/01/21      PT SHORT TERM GOAL #4   Title Pt and caregiver will demo good understanding of falls prevention and safe functional mobility within the home to prevent falls    Time 2    Period Weeks    Status On-going    Target Date 01/18/21             PT Long Term Goals - 01/04/21 1640      PT LONG TERM GOAL #1   Title Pt will demonstrate independence with final HEP    Time 8    Period Weeks    Status New    Target Date 03/01/21      PT LONG TERM GOAL #2   Title Pt will improve five time sit to stand without use of UE to </= 13 seconds and no posterior LOB    Time 8    Period Weeks    Status New  Target Date 03/01/21      PT LONG TERM GOAL #3   Title Pt will demo BLE  strength at least 4+/5    Time 8    Period Weeks    Status New    Target Date 03/01/21      PT LONG TERM GOAL #4   Title Pt will improve BERG by at least 10  points to indicate decreased falls risk    Baseline 24/56    Time 8    Period Weeks    Status New    Target Date 03/01/21      PT LONG TERM GOAL #5   Title Pt will ambulate and complete direction changes with least restrictive assistive device safely without LOB to facilitate improved safety with community negotiation    Time 8    Period Weeks    Status New    Target Date 03/01/21                 Plan - 02/04/21 1432    Clinical Impression Statement Becky with continued limited carryover from sesssion to session but does fairly when given cues for recall with safe sequencing for transfers to/from RW. Increased instability with standing with sit to stands increased reps- no reports of dizziness but definite instability/sway with more repetitions of exercises. Trialed sit to stands with weight around waist with slight improvement in stability into standing, but continus to demo decreased balance reactions in both standing and when sitting on unstable surface. continued gait training with light 1# AWs today with slight improvement observed with steadiness of step placement but no consistent carryovver when they are off. She will benefit from continued balance/gait and endurance training    Personal Factors and Comorbidities Comorbidity 3+    Comorbidities HTN, renal cyst, osteopenia, nystagmus, kidney cysts, iron deficiency anemia, hepatic steatosis, gait abnormality.    Examination-Activity Limitations Locomotion Level;Transfers;Stairs;Toileting    Examination-Participation Restrictions Church;Community Activity;Cleaning    Rehab Potential Fair    PT Frequency 2x / week    PT Duration 8 weeks    PT Treatment/Interventions ADLs/Self Care Home Management;Aquatic Therapy;DME Instruction;Gait training;Stair training;Functional  mobility training;Therapeutic activities;Therapeutic exercise;Balance training;Neuromuscular re-education;Patient/family education;Vestibular;Visual/perceptual remediation/compensation;Manual techniques;Taping    PT Next Visit Plan 10th visit progress note next visit. Progressive TE and balance/neurore-ed exercises as tolerated. Frequent cues for safety. Standing balance/vestibular/multi-sensory balance activities, postural exercises, dynamic balance/ balance recovery.    Consulted and Agree with Plan of Care Patient;Family member/caregiver    Family Member Consulted husband           Patient will benefit from skilled therapeutic intervention in order to improve the following deficits and impairments:  Abnormal gait,Decreased balance,Decreased coordination,Decreased strength,Difficulty walking,Impaired tone,Impaired UE functional use,Impaired vision/preception,Postural dysfunction,Decreased mobility,Decreased safety awareness,Decreased endurance  Visit Diagnosis: Encephalopathy  Grand mal seizure ([place])  Muscle weakness (generalized)  Other abnormalities of gait and mobility  Ataxic gait  Unsteadiness on feet     Problem List Patient Active Problem List   Diagnosis Date Noted  . PAF (paroxysmal atrial fibrillation) ([place]) 12/30/2020  . Heme + stool 12/27/2020  . Hyponatremia 12/27/2020  . Anemia 12/27/2020  . Protein-calorie malnutrition, severe 12/16/2020  . Encephalopathy 12/10/2020  . Grand mal seizure ([place]) 12/02/2020  . COPD (chronic obstructive pulmonary disease) ([place]) 12/02/2020  . Malnutrition of moderate degree 11/28/2020  . Acute encephalopathy   . Acute respiratory failure with hypoxia ([place])   . Aspiration into airway   . Cardiac arrest ([place])   . Groin  mass 08/20/2020  . Falls infrequently 03/27/2019  . Sensory neuropathy 02/01/2019  . 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 Cerebellar ataxia ([place]) 08/23/2018  . Sensory polyneuropathy 08/23/2018  . Incontinence of  feces   . Visual disturbances 02/06/2017  . Essential hypertension 11/07/2016  . Hyperlipidemia 04/24/2015  . Dysuria 02/11/2015  . Acute cystitis without hematuria 02/11/2015  . Chest pain 02/06/2015  . Left inguinal hernia 08/02/2013  . Gait abnormality 05/14/2013  . Gait disorder   . Nystagmus, end-position   . Routine health maintenance 12/13/2011  . LEG CRAMPS, NOCTURNAL 12/07/2010  . CALLUS, TOE 03/19/2010  . hip pain 03/19/2010  . ATAXIA 11/17/2008  . CERVICAL RADICULOPATHY, RIGHT 10/21/2008  . DIVERTICULOSIS OF COLON 08/14/2008  . RENAL CYST 08/14/2008  . ANEMIA-IRON DEFICIENCY 06/16/2007  . Asthma 06/16/2007  . GERD 06/16/2007  . OSTEOPENIA 06/16/2007    Hall Busing , PT, DPT 02/04/2021, 5:59 PM  [place]. [place], [place], [address] Phone: [phone]   Fax:  [phone]  Name: LAKINDRA WIBLE MRN: [id number] Date of Birth: 01-29-1941

## 2021-02-05 ENCOUNTER — Encounter: Payer: Medicare Other | Attending: Registered Nurse | Admitting: Physical Medicine & Rehabilitation

## 2021-02-05 ENCOUNTER — Other Ambulatory Visit: Payer: Self-pay

## 2021-02-05 ENCOUNTER — Encounter: Payer: Self-pay | Admitting: Physical Medicine & Rehabilitation

## 2021-02-05 VITALS — BP 152/84 | HR 63 | Temp 97.8°F | Ht 60.0 in | Wt 89.4 lb

## 2021-02-05 DIAGNOSIS — I63139 Cerebral infarction due to embolism of unspecified carotid artery: Secondary | ICD-10-CM | POA: Insufficient documentation

## 2021-02-05 DIAGNOSIS — R269 Unspecified abnormalities of gait and mobility: Secondary | ICD-10-CM | POA: Insufficient documentation

## 2021-02-05 DIAGNOSIS — I69319 Unspecified symptoms and signs involving cognitive functions following cerebral infarction: Secondary | ICD-10-CM | POA: Insufficient documentation

## 2021-02-05 DIAGNOSIS — I69398 Other sequelae of cerebral infarction: Secondary | ICD-10-CM | POA: Insufficient documentation

## 2021-02-05 NOTE — Patient Instructions (Signed)
Do not walk without walker

## 2021-02-05 NOTE — Progress Notes (Signed)
Subjective:    Patient ID: Kristine Francis, female    DOB: 03-19-1941, 80 y.o.   MRN: [id number] 80 y.o. female with history of COPD, stroke, hypertension, vestibulitis, CVA who was admitted on 11/25/2020 with right-sided weakness, right gaze preference and difficulty speaking.  In ED she was noted to have difficulty handling oral secretions and had tonic-clonic seizure with loss of pulse requiring 2 minutes of CPR as well as intubation.  She received IV Keppra and CTA head/CT head was negative for LVO or acute changes.  MRI brain done revealing multiple foci of susceptibility in bilateral cerebral hemispheres more pronounced in left occipital region question chronic microhemorrhages related to HTN or amyloid angiopathy.  She was treated with antibiotics due to concerns of aspiration pneumonia and Eliquis added due to development of A. fib.  EEG was negative for seizures and showed evidence of moderate to severe encephalopathy.  She tolerated extubation and delirium has resolved.  She was started on dysphagia to honey liquids due to acute on chronic dysphagia.  Therapy was ongoing and patient was limited by weakness, ataxia, vertigo with gait instability as well as cognitive deficits.  CIR was recommended due to functional decline.  Admit date: 12/10/2020 Discharge date: 12/24/2020  HPI 80 year old female with history of bilateral infarcts embolic pattern following cardiac arrest.  She does not remember me from her 2-week rehab length of stay despite daily visits.  She could not tell me much about her rehab stay.  She is accompanied by her husband who drove her to the office today. At home, living with husband , Mod I dressing with the exception of tying shoes, ambulates with a walker in the home. Going to OP PT, OT, SLP 2x per week at [facility] No seizures at home since rehabilitation discharge Pain Inventory Average Pain 0 Pain Right Now 0 My pain is no pain.  LOCATION OF PAIN  No  pain BOWEL Number of stools per week: 7 Oral laxative use No  Type of laxative none Enema or suppository use No  History of colostomy No  Incontinent No   BLADDER Normal In and out cath, frequency N/A Able to self cath No  Bladder incontinence No  Frequent urination No  Leakage with coughing No  Difficulty starting stream No  Incomplete bladder emptying No    Mobility walk with assistance how many minutes can you walk? Unknown ability to climb steps?  yes do you drive?  no use a wheelchair Do you have any goals in this area?  yes  Function retired Do you have any goals in this area?  yes  Neuro/Psych weakness trouble walking  Prior Studies Any changes since last visit?  no  Physicians involved in your care Any changes since last visit?  no   Family History  Problem Relation Age of Onset  . Colon cancer Father   . Coronary artery disease Father   . Heart block Father   . Pancreatic cancer Mother   . Throat cancer Mother   . Colon cancer Paternal Grandmother   . Colon cancer Paternal Aunt        x 2  . Cancer - Lung Paternal Aunt   . Breast cancer Paternal Aunt   . Colon cancer Paternal Aunt   . Ovarian cancer Maternal Aunt   . Heart attack Paternal Grandfather   . Esophageal cancer Other   . Colon polyps Neg Hx   . Rectal cancer Neg Hx   . Stomach cancer  Neg Hx    Social History   Socioeconomic History  . Marital status: Married    Spouse name: Not on file  . Number of children: 3  . Years of education: 33  . Highest education level: Not on file  Occupational History  . 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 Smoking status: Never Smoker  . Smokeless tobacco: Never Used  Vaping Use  . Vaping Use: Never used  Substance and Sexual Activity  . Alcohol use: No    Alcohol/week: 0.0 standard drinks  . Drug use: No  . Sexual activity: Yes    Partners: Male  Other Topics Concern  . 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 married - 1965. 3 sons - '66, '69, '70. Sons in good health. Marriage in good health. Enjoys retirement - remains active.         Social Determinants of Health   Financial Resource Strain: Not on file  Food Insecurity: Not on file  Transportation Needs: Not on file  Physical Activity: Not on file  Stress: Not on file  Social Connections: Not on file   Past Surgical History:  Procedure Laterality Date  . ANAL RECTAL MANOMETRY N/A 12/20/2017   Procedure: ANO RECTAL MANOMETRY;  Surgeon: Mauri Pole, MD;  Location: [facility];  Service: Endoscopy;  Laterality: N/A;  . BREAST BIOPSY Bilateral 1970's  . CATARACT EXTRACTION Bilateral LT:02/14/13,RT:02/21/13  . CERVICAL SPINE SURGERY  2010  . COLONOSCOPY  03-12-14   per Dr. Olevia Perches, diverticulosis only, repeat 5 yrs   . EYE SURGERY Bilateral 01/2013&02/2013   left then right  . HERNIA REPAIR  Oct '14-left, remote-right   years ago right; left done '14  . SKIN CANCER EXCISION  05/13/13   FACE, basal cell  . TONSILLECTOMY     Past Medical History:  Diagnosis Date  . Allergy   . Asthma    sees Dr. Tiajuana Amass - as child per pt  . At high risk for falls    unstable gait   . Ataxia   . Cataract    removed bilaterally   . Diverticulosis   . Emphysema   . Gait abnormality 05/14/2013   Ms.Paulson a patient of Dr. Linda Hedges presented first interpreter thousand 13 with a gait dysfunction, she also had an episodic confusion and Loss device. Exam found a mild nystagmus and she was referred to ophthalmology on 03-2812, brain MRI was normal nystagmus was a secondary diagnosis to vestibulitis, ear nose and throat has followed and had seen the patient. At the time of the nystagmus the patie  . GERD (gastroesophageal reflux disease)   . Hepatic steatosis   . Hyperlipidemia    on atorvastatin   . Hypertension    on losartan   . Iron deficiency anemia,  unspecified   . Kidney cysts 11/16/12   small cyst on left  . Nystagmus, end-position   . Osteopenia   . Renal cyst   . Stroke ([place]) 01/2017   mild    BP (!) 152/84   Pulse 63   Temp 97.8 F (36.6 C)   Ht 5' (1.524 m)   Wt 89 lb 6.4 oz (40.6 kg)   SpO2 95%   BMI 17.46 kg/m   Opioid Risk Score:   Fall Risk Score:  `1  Depression screen PHQ 2/9  Depression  screen [facility] 2/9 01/05/2021 09/18/2018 08/25/2017 02/02/2017 02/02/2017 12/11/2015 11/10/2014  Decreased Interest 0 0 0 0 0 0 0  Down, Depressed, Hopeless 0 0 0 0 0 0 0  PHQ - 2 Score 0 0 0 0 0 0 0  Altered sleeping 0 - - - - - -  Tired, decreased energy 0 - - - - - -  Change in appetite 0 - - - - - -  Feeling bad or failure about yourself  0 - - - - - -  Trouble concentrating 0 - - - - - -  Moving slowly or fidgety/restless 0 - - - - - -  Suicidal thoughts 0 - - - - - -  PHQ-9 Score 0 - - - - - -  Some recent data might be hidden   Review of Systems  Neurological: Positive for weakness.       Right sided weakness Short term memory per Husband  All other systems reviewed and are negative.      Objective:   Physical Exam Vitals reviewed.  Constitutional:      Appearance: She is ill-appearing.     Comments: Low BMI  Eyes:     Extraocular Movements: Extraocular movements intact.     Conjunctiva/sclera: Conjunctivae normal.     Pupils: Pupils are equal, round, and reactive to light.  Cardiovascular:     Rate and Rhythm: Normal rate and regular rhythm.     Heart sounds: Normal heart sounds.  Pulmonary:     Effort: Pulmonary effort is normal. No respiratory distress.     Breath sounds: Normal breath sounds.  Abdominal:     General: Abdomen is flat. Bowel sounds are normal. There is no distension.     Palpations: Abdomen is soft. There is no mass.  Skin:    General: Skin is warm.  Neurological:     Mental Status: She is alert.  Psychiatric:        Mood and Affect: Mood normal.    Motor strength is 4/5  bilateral deltoid, bicep, tricep, grip, hip flexor, knee extensor, ankle dorsiflexor and plantar flexor Negative straight leg raising bilaterally Standing balance is poor evidence of truncal ataxia Oriented to person place and time Speech without dysarthria or aphasia Visual fields are intact confrontation testing Sensation intact light touch bilateral upper and lower limbs There is nystagmus on lateral gaze on the right side.  Horizontal nystagmus       Assessment & Plan:  #1.  Bilateral cerebral infarcts also evidence of more chronic appearing pontine infarcts bilaterally.  Acute episode related to cardiac arrest.  Still has severe cognitive deficits post stroke as well as gait disorder.  She has decreased safety awareness fortunately no falls.  We reiterated need for walker at home since the patient sometimes forgets according to the husband.  Continue outpatient PT OT speech Rehab follow-up in 6 weeks. Follow-up with primary MD Follow-up with neurology, Dr. [name] for seizures

## 2021-02-05 NOTE — Progress Notes (Signed)
Normal copper- level.   The patient is still anemic, but less so than one and two month ago.   There was no monoclonal proliferation/ see protein serum e-phoresis.

## 2021-02-09 ENCOUNTER — Encounter: Payer: Self-pay | Admitting: Occupational Therapy

## 2021-02-09 ENCOUNTER — Other Ambulatory Visit: Payer: Self-pay

## 2021-02-09 ENCOUNTER — Encounter: Payer: Self-pay | Admitting: Speech Pathology

## 2021-02-09 ENCOUNTER — Ambulatory Visit: Payer: Medicare Other | Admitting: Speech Pathology

## 2021-02-09 ENCOUNTER — Ambulatory Visit: Payer: Medicare Other | Admitting: Occupational Therapy

## 2021-02-09 DIAGNOSIS — R1312 Dysphagia, oropharyngeal phase: Secondary | ICD-10-CM

## 2021-02-09 DIAGNOSIS — R270 Ataxia, unspecified: Secondary | ICD-10-CM

## 2021-02-09 DIAGNOSIS — R2689 Other abnormalities of gait and mobility: Secondary | ICD-10-CM | POA: Diagnosis not present

## 2021-02-09 DIAGNOSIS — R41840 Attention and concentration deficit: Secondary | ICD-10-CM

## 2021-02-09 DIAGNOSIS — R41841 Cognitive communication deficit: Secondary | ICD-10-CM

## 2021-02-09 DIAGNOSIS — R260 Ataxic gait: Secondary | ICD-10-CM | POA: Diagnosis not present

## 2021-02-09 DIAGNOSIS — G9340 Encephalopathy, unspecified: Secondary | ICD-10-CM | POA: Diagnosis not present

## 2021-02-09 DIAGNOSIS — M6281 Muscle weakness (generalized): Secondary | ICD-10-CM | POA: Diagnosis not present

## 2021-02-09 DIAGNOSIS — G40409 Other generalized epilepsy and epileptic syndromes, not intractable, without status epilepticus: Secondary | ICD-10-CM | POA: Diagnosis not present

## 2021-02-09 DIAGNOSIS — R2681 Unsteadiness on feet: Secondary | ICD-10-CM | POA: Diagnosis not present

## 2021-02-09 DIAGNOSIS — R278 Other lack of coordination: Secondary | ICD-10-CM

## 2021-02-09 NOTE — Therapy (Signed)
[facility] [place], [place], [address] Phone: [phone]   Fax:  [phone]  Speech Language Pathology Treatment & Progress Note  Patient Details  Name: Kristine Francis MRN: [id number] Date of Birth: 09/30/1941 No data recorded  Encounter Date: 02/09/2021   End of Session - 02/09/21 1547    Visit Number 10    Number of Visits 25    Date for SLP Re-Evaluation 04/03/21    SLP Start Time 1530    SLP Stop Time  1615    SLP Time Calculation (min) 45 min    Activity Tolerance Patient tolerated treatment well           Past Medical History:  Diagnosis Date  . Allergy   . Asthma    sees Dr. Tiajuana Amass - as child per pt  . At high risk for falls    unstable gait   . Ataxia   . Cataract    removed bilaterally   . Diverticulosis   . Emphysema   . Gait abnormality 05/14/2013   Ms.Laubscher a patient of Dr. Linda Hedges presented first interpreter thousand 13 with a gait dysfunction, she also had an episodic confusion and Loss device. Exam found a mild nystagmus and she was referred to ophthalmology on 03-2812, brain MRI was normal nystagmus was a secondary diagnosis to vestibulitis, ear nose and throat has followed and had seen the patient. At the time of the nystagmus the patie  . GERD (gastroesophageal reflux disease)   . Hepatic steatosis   . Hyperlipidemia    on atorvastatin   . Hypertension    on losartan   . Iron deficiency anemia, unspecified   . Kidney cysts 11/16/12   small cyst on left  . Nystagmus, end-position   . Osteopenia   . Renal cyst   . Stroke ([place]) 01/2017   mild     Past Surgical History:  Procedure Laterality Date  . ANAL RECTAL MANOMETRY N/A 12/20/2017   Procedure: ANO RECTAL MANOMETRY;  Surgeon: Mauri Pole, MD;  Location: [facility];  Service: Endoscopy;  Laterality: N/A;  . BREAST BIOPSY Bilateral 1970's  . CATARACT EXTRACTION Bilateral LT:02/14/13,RT:02/21/13  . CERVICAL SPINE  SURGERY  2010  . COLONOSCOPY  03-12-14   per Dr. Olevia Perches, diverticulosis only, repeat 5 yrs   . EYE SURGERY Bilateral 01/2013&02/2013   left then right  . HERNIA REPAIR  Oct '14-left, remote-right   years ago right; left done '14  . SKIN CANCER EXCISION  05/13/13   FACE, basal cell  . TONSILLECTOMY      There were no vitals filed for this visit.          ADULT SLP TREATMENT - 02/09/21 1544      General Information   Behavior/Cognition Alert;Cooperative;Pleasant mood;Requires cueing      Treatment Provided   Treatment provided Cognitive-Linquistic      Cognitive-Linquistic Treatment   Treatment focused on Cognition    Skilled Treatment Discussed use of memory journal today. Also recorded voice to remind pt of her promise to try thickener in her coffee. Attempted use of memory strategies.Required written visual cue to complete and repetition was beneficial.      Assessment / Recommendations / Plan   Plan Continue with current plan of care      Progression Toward Goals   Progression toward goals Progressing toward goals              SLP Short  Term Goals - 02/09/21 1548      SLP SHORT TERM GOAL #1   Title Patient will tell SLP 3 areas of deficit/challenge for her given 2 verbal cues.    Time 3    Period Weeks    Status On-going      SLP SHORT TERM GOAL #2   Title Patient will recall personally-relevant information post >5 minute delay.    Baseline <5 min    Time 3    Period Weeks    Status On-going      SLP SHORT TERM GOAL #3   Title The patient will complete swallowing maneuvers (supraglottic swallow, Mendelson maneuver, effortful swallow, etc.) to improve oral motor weakness, tongue base retraction, hyolaryngeal excursion, airway protection, and clearance of the bolus through the pharynx with moderate verbal, visual and tactile cues.    Time 3    Period Weeks    Status Deferred   Goal deferred due to lack of compliance.     SLP SHORT TERM GOAL #4   Title Pt  and family will complete memory book page prior to coming to therapy to assist with memory of important information.    Time 4    Period Weeks    Status New            SLP Long Term Goals - 02/09/21 1548      SLP LONG TERM GOAL #1   Title Client will utilize compensatory strategies with optimum safety and efficiency of swallowing function of P.O. intake without overt signs and symptoms of aspiration for the highest appropriate diet level.    Baseline honey thick/dysphagia 2/minced and moist    Time 8    Period Weeks    Status On-going      SLP LONG TERM GOAL #2   Title Patient will demonstrate use of memory strategies to schedule activities, recall weekly events and items to maintain safety to participate socially in functional living environment    Time 8    Period Weeks    Status On-going      SLP LONG TERM GOAL #3   Title Patient will demonstrate use of information processing and self monitoring during daily living activities to improve safety and awareness in functional living environment    Time 8    Period Weeks    Status On-going      SLP LONG TERM GOAL #4   Title Patient will develop functional attention skills to effectively attend to and communicate in tasks of daily living in their functional living environment.    Time 8    Period Weeks    Status On-going            Plan - 02/09/21 1547    Clinical Impression Statement Pt is a 80 yo female with hx of encephalopathy 2/2 to seizures. Hx of intubation and ventilator use. Dysphagia. Dysarthria. Cognitive deficit. Pt was given the SLUMS which indicated a deficit in attention, short term memory, problem solving, and executive functioning skills. Pt husband is responsible for all ADLs due to significant memory deficits. Pt reports she would like to be more self-sufficient with medications and her thinking skills. Previous documentation and MBS rec dysphagia 2 diet and honey thick liquids. SLP to address "minced and moist  (IDDSI)" terminology with patient and spouse. Oropharyngeal dysphagia to be addressed in treatment. Dysarthria noted in conversation with impaired articulation. Gurgly vocal quality noted. "Silent aspiration" addressed with patient and spouse. SLP rec skilled speech services to  address cognitive-linguistic deficit, dysphagia, and dysarthria treatments to increase quality of life and participation. Adding [employer] **Decreasing frequency to x1/week to check in with memory strategies and maintenance.    Speech Therapy Frequency 1x /week    Duration 12 weeks    Treatment/Interventions Aspiration precaution training;Trials of upgraded texture/liquids;Compensatory strategies;Pharyngeal strengthening exercises;Oral motor exercises;Cueing hierarchy;Functional tasks;Patient/family education;Diet toleration management by SLP;Environmental controls;Cognitive reorganization;Compensatory techniques;Internal/external aids;SLP instruction and feedback    Potential to Achieve Goals Fair    Potential Considerations Ability to learn/carryover information    Consulted and Agree with Plan of Care Patient;Family member/caregiver    Family Member Consulted [name] (spouse)           Patient will benefit from skilled therapeutic intervention in order to improve the following deficits and impairments:   Cognitive communication deficit  Dysphagia, oropharyngeal phase    Problem List Patient Active Problem List   Diagnosis Date Noted  . PAF (paroxysmal atrial fibrillation) ([place]) 12/30/2020  . Heme + stool 12/27/2020  . Hyponatremia 12/27/2020  . Anemia 12/27/2020  . Protein-calorie malnutrition, severe 12/16/2020  . Encephalopathy 12/10/2020  . Grand mal seizure ([place]) 12/02/2020  . COPD (chronic obstructive pulmonary disease) ([place]) 12/02/2020  . Malnutrition of moderate degree 11/28/2020  . Acute encephalopathy   . Acute respiratory failure with hypoxia ([place])   . Aspiration into airway   .  Cardiac arrest ([place])   . Groin mass 08/20/2020  . Falls infrequently 03/27/2019  . Sensory neuropathy 02/01/2019  . 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 Cerebellar ataxia ([place]) 08/23/2018  . Sensory polyneuropathy 08/23/2018  . Incontinence of feces   . Visual disturbances 02/06/2017  . Essential hypertension 11/07/2016  . Hyperlipidemia 04/24/2015  . Dysuria 02/11/2015  . Acute cystitis without hematuria 02/11/2015  . Chest pain 02/06/2015  . Left inguinal hernia 08/02/2013  . Gait abnormality 05/14/2013  . Gait disorder   . Nystagmus, end-position   . Routine health maintenance 12/13/2011  . LEG CRAMPS, NOCTURNAL 12/07/2010  . CALLUS, TOE 03/19/2010  . hip pain 03/19/2010  . ATAXIA 11/17/2008  . CERVICAL RADICULOPATHY, RIGHT 10/21/2008  . DIVERTICULOSIS OF COLON 08/14/2008  . RENAL CYST 08/14/2008  . ANEMIA-IRON DEFICIENCY 06/16/2007  . Asthma 06/16/2007  . GERD 06/16/2007  . OSTEOPENIA 06/16/2007    Speech Therapy Progress Note  Dates of Reporting Period: 02/01/21 to 04/03/21  Objective Reports of Subjective Statement: Pt has not made much progress during these 10 visits. Pt recalls limited information which prevents generalization of skills.   Objective Measurements: See skilled treatment.  Goal Update: See updated goals.  Plan: To decrease service to x1/week to check in with dysphagia and caregiver education on memory.   Reason Skilled Services are Required: Pt required skilled speech services to assist with caregiver education and continued maintenance of dysphagia care.   Rosann Auerbach [place] MS, [place], CBIS  02/09/2021, 3:51 PM  [facility]. [place], [place], [address] Phone: [phone]   Fax:  [phone]   Name: Kristine Francis MRN: [id number] Date of Birth: 1941/10/06

## 2021-02-10 LAB — VITAMIN B12: Vitamin B-12: 347 pg/mL (ref 232–1245)

## 2021-02-10 LAB — CBC WITH DIFFERENTIAL/PLATELET
Basophils Absolute: 0.1 10*3/uL (ref 0.0–0.2)
Basos: 1 %
EOS (ABSOLUTE): 0.2 10*3/uL (ref 0.0–0.4)
Eos: 4 %
Hematocrit: 32 % — ABNORMAL LOW (ref 34.0–46.6)
Hemoglobin: 10.1 g/dL — ABNORMAL LOW (ref 11.1–15.9)
Immature Grans (Abs): 0 10*3/uL (ref 0.0–0.1)
Immature Granulocytes: 0 %
Lymphocytes Absolute: 1.6 10*3/uL (ref 0.7–3.1)
Lymphs: 25 %
MCH: 26.2 pg — ABNORMAL LOW (ref 26.6–33.0)
MCHC: 31.6 g/dL (ref 31.5–35.7)
MCV: 83 fL (ref 79–97)
Monocytes Absolute: 0.6 10*3/uL (ref 0.1–0.9)
Monocytes: 9 %
Neutrophils Absolute: 4 10*3/uL (ref 1.4–7.0)
Neutrophils: 61 %
Platelets: 427 10*3/uL (ref 150–450)
RBC: 3.86 x10E6/uL (ref 3.77–5.28)
RDW: 13.8 % (ref 11.7–15.4)
WBC: 6.5 10*3/uL (ref 3.4–10.8)

## 2021-02-10 LAB — COMPREHENSIVE METABOLIC PANEL
ALT: 12 IU/L (ref 0–32)
AST: 17 IU/L (ref 0–40)
Albumin/Globulin Ratio: 1.6 (ref 1.2–2.2)
Albumin: 4.1 g/dL (ref 3.7–4.7)
Alkaline Phosphatase: 89 IU/L (ref 44–121)
BUN/Creatinine Ratio: 31 — ABNORMAL HIGH (ref 12–28)
BUN: 18 mg/dL (ref 8–27)
Bilirubin Total: 0.4 mg/dL (ref 0.0–1.2)
CO2: 23 mmol/L (ref 20–29)
Calcium: 9.3 mg/dL (ref 8.7–10.3)
Chloride: 102 mmol/L (ref 96–106)
Creatinine, Ser: 0.59 mg/dL (ref 0.57–1.00)
Globulin, Total: 2.5 g/dL (ref 1.5–4.5)
Glucose: 106 mg/dL — ABNORMAL HIGH (ref 65–99)
Potassium: 4.2 mmol/L (ref 3.5–5.2)
Sodium: 140 mmol/L (ref 134–144)
Total Protein: 6.6 g/dL (ref 6.0–8.5)
eGFR: 92 mL/min/{1.73_m2} (ref >59)

## 2021-02-10 LAB — SJOGREN'S SYNDROME ANTIBODS(SSA + SSB)
ENA SSA (RO) Ab: <0.2 AI (ref 0.0–0.9)
ENA SSB (LA) Ab: <0.2 AI (ref 0.0–0.9)

## 2021-02-10 LAB — COPPER, SERUM: Copper: 126 ug/dL (ref 80–158)

## 2021-02-10 LAB — MULTIPLE MYELOMA PANEL, SERUM
Albumin SerPl Elph-Mcnc: 3.7 g/dL (ref 2.9–4.4)
Albumin/Glob SerPl: 1.3 (ref 0.7–1.7)
Alpha 1: 0.3 g/dL (ref 0.0–0.4)
Alpha2 Glob SerPl Elph-Mcnc: 0.8 g/dL (ref 0.4–1.0)
B-Globulin SerPl Elph-Mcnc: 1 g/dL (ref 0.7–1.3)
Gamma Glob SerPl Elph-Mcnc: 0.8 g/dL (ref 0.4–1.8)
Globulin, Total: 2.9 g/dL (ref 2.2–3.9)
IgA/Immunoglobulin A, Serum: 431 mg/dL — ABNORMAL HIGH (ref 64–422)
IgG (Immunoglobin G), Serum: 878 mg/dL (ref 586–1602)
IgM (Immunoglobulin M), Srm: 67 mg/dL (ref 26–217)

## 2021-02-10 LAB — ANA W/REFLEX: ANA Titer 1: NEGATIVE

## 2021-02-10 LAB — SEDIMENTATION RATE: Sed Rate: 18 mm/hr (ref 0–40)

## 2021-02-10 LAB — C-REACTIVE PROTEIN: CRP: 2 mg/L (ref 0–10)

## 2021-02-10 LAB — VITAMIN B1: Thiamine: 111.1 nmol/L (ref 66.5–200.0)

## 2021-02-10 LAB — TSH: TSH: 2.6 u[IU]/mL (ref 0.450–4.500)

## 2021-02-10 NOTE — Therapy (Signed)
[place] [place], [place], [address] Phone: [phone]   Fax:  [phone]  Occupational Therapy Treatment & Progress Note  Patient Details  Name: Kristine Francis MRN: [id number] Date of Birth: 1941-10-17 Referring Provider (OT): Lauraine Rinne, PA-C   Encounter Date: 02/09/2021   OT End of Session - 02/09/21 1455    Visit Number 10    Number of Visits 13    Date for OT Re-Evaluation 02/22/21    Authorization Type Medicare    Progress Note Due on Visit 10    OT Start Time 1450    OT Stop Time 1532    OT Time Calculation (min) 42 min    Activity Tolerance Patient tolerated treatment well    Behavior During Therapy [facility] for tasks assessed/performed   Confusion          Past Medical History:  Diagnosis Date  . Allergy   . Asthma    sees Dr. Tiajuana Amass - as child per pt  . At high risk for falls    unstable gait   . Ataxia   . Cataract    removed bilaterally   . Diverticulosis   . Emphysema   . Gait abnormality 05/14/2013   Ms.Rosenbach a patient of Dr. Linda Hedges presented first interpreter thousand 13 with a gait dysfunction, she also had an episodic confusion and Loss device. Exam found a mild nystagmus and she was referred to ophthalmology on 03-2812, brain MRI was normal nystagmus was a secondary diagnosis to vestibulitis, ear nose and throat has followed and had seen the patient. At the time of the nystagmus the patie  . GERD (gastroesophageal reflux disease)   . Hepatic steatosis   . Hyperlipidemia    on atorvastatin   . Hypertension    on losartan   . Iron deficiency anemia, unspecified   . Kidney cysts 11/16/12   small cyst on left  . Nystagmus, end-position   . Osteopenia   . Renal cyst   . Stroke ([place]) 01/2017   mild     Past Surgical History:  Procedure Laterality Date  . ANAL RECTAL MANOMETRY N/A 12/20/2017   Procedure: ANO RECTAL MANOMETRY;  Surgeon: Mauri Pole, MD;  Location:  [facility];  Service: Endoscopy;  Laterality: N/A;  . BREAST BIOPSY Bilateral 1970's  . CATARACT EXTRACTION Bilateral LT:02/14/13,RT:02/21/13  . CERVICAL SPINE SURGERY  2010  . COLONOSCOPY  03-12-14   per Dr. Olevia Perches, diverticulosis only, repeat 5 yrs   . EYE SURGERY Bilateral 01/2013&02/2013   left then right  . HERNIA REPAIR  Oct '14-left, remote-right   years ago right; left done '14  . SKIN CANCER EXCISION  05/13/13   FACE, basal cell  . TONSILLECTOMY      There were no vitals filed for this visit.   Subjective Assessment - 02/09/21 1454    Subjective  "I don't remember needing to take breaks when I'm vacuuming"    Patient is accompanied by: Family member   [name] (husband)   Pertinent History CVA (March 2018), ataxia, COPD, osteopenia, neuropathy, gait abnormality, asthma, iron deficiency anemia, GERD, and hx of nystagmus    Patient Stated Goals Get back to being independent; play tennis again    Currently in Pain? No/denies            OT Treatments/Exercises (OP) - 02/09/21 1756      ADLs   Cooking Simulated sequencing of making a PB&J sandwich; pt able  to demonstrate correct sequencing of activity w/ Mod I    Home Maintenance OT discussed and demonstrated strategy of having a chair positioned next to the sink during dishwashing to facilitate energy conservation due to report that pt requires frequent rest breaks while handwashing dishes    Increased Safety Strategies OT attempted to re-visit energy conservation and safety strategies to facilitate in-home safety; pt's deficits w/ memory and awareness are significantly limiting to home carryover and her spouse is unable to engage her in strategies at home      Neurological Re-education Exercises   Other Exercises 1 Symmetrical bilateral coordination involving patient placing and removing wooden pegs w/ both hands; pt able to remove pegs simultaneously, but required visual attention to each peg individually when placing them into  pegboard      Fine Motor Coordination (Hand/Wrist)   Fine Motor Coordination Manipulation of small objects    Manipulation of small objects Pt attempted buttoning/unbuttoning and tying clothing fasteners -- unable to button or unbutton small buttons in 3/3 attempts, but was able to unbutton large buttons in 2/2 attempts; unable to tie pants in 3/3 attempts            OT Short Term Goals - 02/02/21 1703      OT SHORT TERM GOAL #1   Title Pt will be able to complete initial HEP with Min A from caregiver    Baseline No current HEP    Time 3    Period Weeks    Status Partially Met   02/03/21 - pt is able to complete initial HEP but spouse reports non-compliance at home   Target Date 01/26/21      OT SHORT TERM GOAL #2   Title Pt will be able to complete asymmetrical bilateral coordination activities independently in at least 3/4 trials to improve functional UE use during ADLs.    Baseline Decreased BUE GM coordination    Time 3    Period Weeks    Status On-going            OT Long Term Goals - 02/09/21 1753      OT LONG TERM GOAL #1   Title Pt will complete HEP designed for BUE strength and coordination with Mod I and report carryover to home    Baseline No current HEP    Time 6    Period Weeks    Status Not Met   02/09/21 - spouse reports pt does not complete any of her exercises at home     OT LONG TERM GOAL #2   Title Pt will demonstrate preparing a simple meal safely with Min A    Baseline Currently unable to prepare meals safely    Time 6    Period Weeks    Status On-going      OT LONG TERM GOAL #3   Title Pt will be able to safely complete UB/LB dressing with Mod I at least 75% of the time    Baseline Currently SPV/SBA for dressing    Time 6    Period Weeks    Status Achieved   02/09/21 - pt and her spouse report she is able to get her clothes out and get herself dressed w/ Mod I     OT LONG TERM GOAL #4   Title Pt will be able to manipulate clothing fasteners  without assistance in at least 4/5 trials    Baseline Decreased [place] as evidenced by time on 9-Hole Peg Test  Time 6    Period Weeks    Status On-going      OT LONG TERM GOAL #5   Title Pt will verbalize understanding of at least 3 compensatory/safety strategies to improve safety with ADLs and functional recall    Baseline Decreased safety awareness and recall; no current compensatory strategies for ADLs    Time 6    Period Weeks    Status Not Met   02/09/21 - pt unable to recall any energy conservation/safety strategies           Plan - 02/09/21 1732    Clinical Impression Statement Due to need for progress note and upcoming OT re-evaluation, OT reviewed all goals w/ pt and discussed observed difficulties and memory deficits w/ pt. Carryover of HEP and energy conservation strategies to home is poor, but pt has demonstrated some improvement in overall endurance and pt's spouse reports he has noticed some improvements at home. Comprehensively determining PLOF continues to be difficult due to pt's memory and awareness limitations and inconsistent spousal report.    OT Occupational Profile and History Detailed Assessment- Review of Records and additional review of physical, cognitive, psychosocial history related to current functional performance    Occupational performance deficits (Please refer to evaluation for details): ADL's;IADL's;Leisure    Body Structure / Function / Physical Skills ADL;Decreased knowledge of precautions;ROM;UE functional use;FMC;Dexterity;Body mechanics;Decreased knowledge of use of DME;Balance;Vision;GMC;Pain;Strength;IADL;Coordination    Cognitive Skills Attention;Problem Solve;Safety Awareness    Rehab Potential Good    Clinical Decision Making Several treatment options, min-mod task modification necessary    Comorbidities Affecting Occupational Performance: Presence of comorbidities impacting occupational performance    Modification or Assistance to Complete  Evaluation  Min-Moderate modification of tasks or assist with assess necessary to complete eval    OT Frequency 2x / week    OT Duration 6 weeks    OT Treatment/Interventions Self-care/ADL training;Therapeutic exercise;Visual/perceptual remediation/compensation;Aquatic Therapy;Moist Heat;Neuromuscular education;Patient/family education;Energy conservation;Therapeutic activities;Fluidtherapy;Cryotherapy;DME and/or AE instruction;Manual Therapy;Passive range of motion;Cognitive remediation/compensation    Plan FMC/shoe tying; UB strengthening    Consulted and Agree with Plan of Care Patient;Family member/caregiver    Family Member Consulted Husband [name])           Patient will benefit from skilled therapeutic intervention in order to improve the following deficits and impairments:   Body Structure / Function / Physical Skills: ADL,Decreased knowledge of precautions,ROM,UE functional use,FMC,Dexterity,Body mechanics,Decreased knowledge of use of DME,Balance,Vision,GMC,Pain,Strength,IADL,Coordination Cognitive Skills: Attention,Problem Solve,Safety Awareness    Occupational Therapy Progress Note  Dates of Reporting Period: 01/04/21 to 02/09/21  Objective Reports of Subjective Statement: Pt consistently demonstrates poor intellectual awareness and short-term and working memory   Objective Measurements: Pt is now able to complete full HEP, but with no carryover to home; Mod I w/ UB and LB dressing  Goal Update: Pt is working toward 2/5 LTGs; she has met 1/5 LTGs and not met 3/5. Pt is also working toward 1/2 STGs and has partially met remaining 1/2.  Plan: Continue to work toward functional bilateral coordination, improving independence w/ clothing fasteners, and safety/participation with basic IADLs  Reason Skilled Services are Required: Pt continues to exhibit decreased safety awareness and moderate fall risk during daily activities. She continues to have difficulty w/ cognition, overall  endurance, and UB strength.   Visit Diagnosis: Ataxia  Muscle weakness (generalized)  Other lack of coordination  Attention and concentration deficit    Problem List Patient Active Problem List   Diagnosis Date Noted  . PAF (paroxysmal atrial  fibrillation) ([place]) 12/30/2020  . Heme + stool 12/27/2020  . Hyponatremia 12/27/2020  . Anemia 12/27/2020  . Protein-calorie malnutrition, severe 12/16/2020  . Encephalopathy 12/10/2020  . Grand mal seizure ([place]) 12/02/2020  . COPD (chronic obstructive pulmonary disease) ([place]) 12/02/2020  . Malnutrition of moderate degree 11/28/2020  . Acute encephalopathy   . Acute respiratory failure with hypoxia ([place])   . Aspiration into airway   . Cardiac arrest ([place])   . Groin mass 08/20/2020  . Falls infrequently 03/27/2019  . Sensory neuropathy 02/01/2019  . 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 Cerebellar ataxia ([place]) 08/23/2018  . Sensory polyneuropathy 08/23/2018  . Incontinence of feces   . Visual disturbances 02/06/2017  . Essential hypertension 11/07/2016  . Hyperlipidemia 04/24/2015  . Dysuria 02/11/2015  . Acute cystitis without hematuria 02/11/2015  . Chest pain 02/06/2015  . Left inguinal hernia 08/02/2013  . Gait abnormality 05/14/2013  . Gait disorder   . Nystagmus, end-position   . Routine health maintenance 12/13/2011  . LEG CRAMPS, NOCTURNAL 12/07/2010  . CALLUS, TOE 03/19/2010  . hip pain 03/19/2010  . ATAXIA 11/17/2008  . CERVICAL RADICULOPATHY, RIGHT 10/21/2008  . DIVERTICULOSIS OF COLON 08/14/2008  . RENAL CYST 08/14/2008  . ANEMIA-IRON DEFICIENCY 06/16/2007  . Asthma 06/16/2007  . GERD 06/16/2007  . OSTEOPENIA 06/16/2007     Kathrine Cords, OTR/L, MSOT 02/10/2021, 2:18 PM  [place]. [place], [place], [address] Phone: [phone]   Fax:  [phone]  Name: Kristine Francis MRN: [id number] Date of Birth: 08/02/1941

## 2021-02-11 ENCOUNTER — Other Ambulatory Visit: Payer: Self-pay

## 2021-02-11 ENCOUNTER — Encounter: Payer: Self-pay | Admitting: Occupational Therapy

## 2021-02-11 ENCOUNTER — Ambulatory Visit: Payer: Medicare Other | Admitting: Occupational Therapy

## 2021-02-11 DIAGNOSIS — R2689 Other abnormalities of gait and mobility: Secondary | ICD-10-CM | POA: Diagnosis not present

## 2021-02-11 DIAGNOSIS — R41840 Attention and concentration deficit: Secondary | ICD-10-CM

## 2021-02-11 DIAGNOSIS — R2681 Unsteadiness on feet: Secondary | ICD-10-CM | POA: Diagnosis not present

## 2021-02-11 DIAGNOSIS — G40409 Other generalized epilepsy and epileptic syndromes, not intractable, without status epilepticus: Secondary | ICD-10-CM | POA: Diagnosis not present

## 2021-02-11 DIAGNOSIS — R278 Other lack of coordination: Secondary | ICD-10-CM

## 2021-02-11 DIAGNOSIS — R270 Ataxia, unspecified: Secondary | ICD-10-CM

## 2021-02-11 DIAGNOSIS — M6281 Muscle weakness (generalized): Secondary | ICD-10-CM | POA: Diagnosis not present

## 2021-02-11 DIAGNOSIS — R260 Ataxic gait: Secondary | ICD-10-CM | POA: Diagnosis not present

## 2021-02-11 DIAGNOSIS — G9340 Encephalopathy, unspecified: Secondary | ICD-10-CM | POA: Diagnosis not present

## 2021-02-11 NOTE — Therapy (Signed)
[facility]. [place], [place], [address] Phone: [phone]   Fax:  [phone]  Occupational Therapy Treatment  Patient Details  Name: Kristine Francis MRN: [id number] Date of Birth: 11-Apr-1941 Referring Provider (OT): Lauraine Rinne, PA-C   Encounter Date: 02/11/2021   OT End of Session - 02/11/21 1535    Visit Number 11    Number of Visits 13    Date for OT Re-Evaluation 02/22/21    Authorization Type Medicare    Progress Note Due on Visit 10    OT Start Time 0330    OT Stop Time 0415    OT Time Calculation (min) 45 min    Activity Tolerance Patient tolerated treatment well    Behavior During Therapy [facility] for tasks assessed/performed   Confusion          Past Medical History:  Diagnosis Date  . Allergy   . Asthma    sees Dr. Tiajuana Amass - as child per pt  . At high risk for falls    unstable gait   . Ataxia   . Cataract    removed bilaterally   . Diverticulosis   . Emphysema   . Gait abnormality 05/14/2013   Ms.Borre a patient of Dr. Linda Hedges presented first interpreter thousand 13 with a gait dysfunction, she also had an episodic confusion and Loss device. Exam found a mild nystagmus and she was referred to ophthalmology on 03-2812, brain MRI was normal nystagmus was a secondary diagnosis to vestibulitis, ear nose and throat has followed and had seen the patient. At the time of the nystagmus the patie  . GERD (gastroesophageal reflux disease)   . Hepatic steatosis   . Hyperlipidemia    on atorvastatin   . Hypertension    on losartan   . Iron deficiency anemia, unspecified   . Kidney cysts 11/16/12   small cyst on left  . Nystagmus, end-position   . Osteopenia   . Renal cyst   . Stroke ([place]) 01/2017   mild     Past Surgical History:  Procedure Laterality Date  . ANAL RECTAL MANOMETRY N/A 12/20/2017   Procedure: ANO RECTAL MANOMETRY;  Surgeon: Mauri Pole, MD;  Location: [facility];   Service: Endoscopy;  Laterality: N/A;  . BREAST BIOPSY Bilateral 1970's  . CATARACT EXTRACTION Bilateral LT:02/14/13,RT:02/21/13  . CERVICAL SPINE SURGERY  2010  . COLONOSCOPY  03-12-14   per Dr. Olevia Perches, diverticulosis only, repeat 5 yrs   . EYE SURGERY Bilateral 01/2013&02/2013   left then right  . HERNIA REPAIR  Oct '14-left, remote-right   years ago right; left done '14  . SKIN CANCER EXCISION  05/13/13   FACE, basal cell  . TONSILLECTOMY      There were no vitals filed for this visit.   Subjective Assessment - 02/11/21 1533    Subjective  "What exactly is this helping me with?"    Patient is accompanied by: Family member   [name] (husband)   Pertinent History CVA (March 2018), ataxia, COPD, osteopenia, neuropathy, gait abnormality, asthma, iron deficiency anemia, GERD, and hx of nystagmus    Patient Stated Goals Get back to being independent; play tennis again    Currently in Pain? No/denies            OT Treatments/Exercises (OP) - 02/11/21 1753      ADLs   LB Dressing Able to complete shoe tying x1 w/ Mod I, requiring multiple attempts and extra  time      Neurological Re-education Exercises   Theraputty - Flatten Open-handed flattening completed w/ each hand 10x using yellow putty; verbal cues for attention to task   Increased compensatory strategies needed for R side   Theraputty - Grip Full gross grasp w/ each hand completed 10x using yellow putty; pt required verbal cues of "squeeze" and "open" for attention to task/sequencing      Fine Motor Coordination (Hand/Wrist)   Fine Motor Coordination Manipulation of small objects    Manipulation of small objects Threading beads onto string completed w/ each hand; pt required verbal cue 2x to notice she had dropped a bead            OT Short Term Goals - 02/11/21 1751      OT SHORT TERM GOAL #1   Title Pt will be able to complete initial HEP with Min A from caregiver    Baseline No current HEP    Time 3    Period Weeks     Status Partially Met   02/03/21 - pt is able to complete initial HEP but spouse reports non-compliance at home   Target Date 01/26/21      OT SHORT TERM GOAL #2   Title Pt will be able to complete asymmetrical bilateral coordination activities independently in at least 3/4 trials to improve functional UE use during ADLs.    Baseline Decreased BUE GM coordination    Time 3    Period Weeks    Status Achieved   02/11/21 - completed asymmetrical bilateral coordination during [facility] activity w/out difficulty in multiple trials            OT Long Term Goals - 02/09/21 1753      OT LONG TERM GOAL #1   Title Pt will complete HEP designed for BUE strength and coordination with Mod I and report carryover to home    Baseline No current HEP    Time 6    Period Weeks    Status Not Met   02/09/21 - spouse reports pt does not complete any of her exercises at home     OT LONG TERM GOAL #2   Title Pt will demonstrate preparing a simple meal safely with Min A    Baseline Currently unable to prepare meals safely    Time 6    Period Weeks    Status On-going      OT LONG TERM GOAL #3   Title Pt will be able to safely complete UB/LB dressing with Mod I at least 75% of the time    Baseline Currently SPV/SBA for dressing    Time 6    Period Weeks    Status Achieved   02/09/21 - pt and her spouse report she is able to get her clothes out and get herself dressed w/ Mod I     OT LONG TERM GOAL #4   Title Pt will be able to manipulate clothing fasteners without assistance in at least 4/5 trials    Baseline Decreased [place] as evidenced by time on 9-Hole Peg Test    Time 6    Period Weeks    Status On-going      OT LONG TERM GOAL #5   Title Pt will verbalize understanding of at least 3 compensatory/safety strategies to improve safety with ADLs and functional recall    Baseline Decreased safety awareness and recall; no current compensatory strategies for ADLs    Time 6  Period Weeks    Status Not Met    02/09/21 - pt unable to recall any energy conservation/safety strategies           Plan - 02/11/21 1747    Clinical Impression Statement Pt continues to demonstrate difficulty w/ dexterity and [facility], which pt's spouse reports is not where she was prior to most recent hospitalization. Pt's difficulties w/ short-term and working memory as well as awareness of deficits and safety are significantly limiting to in-home carryover of compensatory strategies during functional activities.    OT Occupational Profile and History Detailed Assessment- Review of Records and additional review of physical, cognitive, psychosocial history related to current functional performance    Occupational performance deficits (Please refer to evaluation for details): ADL's;IADL's;Leisure    Body Structure / Function / Physical Skills ADL;Decreased knowledge of precautions;ROM;UE functional use;FMC;Dexterity;Body mechanics;Decreased knowledge of use of DME;Balance;Vision;GMC;Pain;Strength;IADL;Coordination    Cognitive Skills Attention;Problem Solve;Safety Awareness    Rehab Potential Good    Clinical Decision Making Several treatment options, min-mod task modification necessary    Comorbidities Affecting Occupational Performance: Presence of comorbidities impacting occupational performance    Modification or Assistance to Complete Evaluation  Min-Moderate modification of tasks or assist with assess necessary to complete eval    OT Frequency 2x / week    OT Duration 6 weeks    OT Treatment/Interventions Self-care/ADL training;Therapeutic exercise;Visual/perceptual remediation/compensation;Aquatic Therapy;Moist Heat;Neuromuscular education;Patient/family education;Energy conservation;Therapeutic activities;Fluidtherapy;Cryotherapy;DME and/or AE instruction;Manual Therapy;Passive range of motion;Cognitive remediation/compensation    Plan FMC/shoe tying; UB strengthening    Consulted and Agree with Plan of Care Patient;Family  member/caregiver    Family Member Consulted Husband [name])           Patient will benefit from skilled therapeutic intervention in order to improve the following deficits and impairments:   Body Structure / Function / Physical Skills: ADL,Decreased knowledge of precautions,ROM,UE functional use,FMC,Dexterity,Body mechanics,Decreased knowledge of use of DME,Balance,Vision,GMC,Pain,Strength,IADL,Coordination Cognitive Skills: Attention,Problem Solve,Safety Awareness     Visit Diagnosis: Ataxia  Muscle weakness (generalized)  Other lack of coordination  Attention and concentration deficit    Problem List Patient Active Problem List   Diagnosis Date Noted  . PAF (paroxysmal atrial fibrillation) ([place]) 12/30/2020  . Heme + stool 12/27/2020  . Hyponatremia 12/27/2020  . Anemia 12/27/2020  . Protein-calorie malnutrition, severe 12/16/2020  . Encephalopathy 12/10/2020  . Grand mal seizure ([place]) 12/02/2020  . COPD (chronic obstructive pulmonary disease) ([place]) 12/02/2020  . Malnutrition of moderate degree 11/28/2020  . Acute encephalopathy   . Acute respiratory failure with hypoxia ([place])   . Aspiration into airway   . Cardiac arrest ([place])   . Groin mass 08/20/2020  . Falls infrequently 03/27/2019  . Sensory neuropathy 02/01/2019  . 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 Cerebellar ataxia ([place]) 08/23/2018  . Sensory polyneuropathy 08/23/2018  . Incontinence of feces   . Visual disturbances 02/06/2017  . Essential hypertension 11/07/2016  . Hyperlipidemia 04/24/2015  . Dysuria 02/11/2015  . Acute cystitis without hematuria 02/11/2015  . Chest pain 02/06/2015  . Left inguinal hernia 08/02/2013  . Gait abnormality 05/14/2013  . Gait disorder   . Nystagmus, end-position   . Routine health maintenance 12/13/2011  . LEG CRAMPS, NOCTURNAL 12/07/2010  . CALLUS, TOE 03/19/2010  . hip pain 03/19/2010  . ATAXIA 11/17/2008  . CERVICAL RADICULOPATHY, RIGHT 10/21/2008  .  DIVERTICULOSIS OF COLON 08/14/2008  . RENAL CYST 08/14/2008  . ANEMIA-IRON DEFICIENCY 06/16/2007  . Asthma 06/16/2007  . GERD 06/16/2007  . OSTEOPENIA 06/16/2007  Kathrine Cords, OTR/L, MSOT 02/11/2021, 6:01 PM  [place]. [place], [place], [address] Phone: [phone]   Fax:  [phone]  Name: Kristine Francis MRN: [id number] Date of Birth: 24-Mar-1941

## 2021-02-16 ENCOUNTER — Ambulatory Visit: Payer: Medicare Other | Admitting: Occupational Therapy

## 2021-02-16 ENCOUNTER — Encounter: Payer: Self-pay | Admitting: Physical Therapy

## 2021-02-16 ENCOUNTER — Other Ambulatory Visit: Payer: Self-pay

## 2021-02-16 ENCOUNTER — Ambulatory Visit: Payer: Medicare Other | Admitting: Physical Therapy

## 2021-02-16 ENCOUNTER — Encounter: Payer: Self-pay | Admitting: Occupational Therapy

## 2021-02-16 DIAGNOSIS — R41840 Attention and concentration deficit: Secondary | ICD-10-CM

## 2021-02-16 DIAGNOSIS — R260 Ataxic gait: Secondary | ICD-10-CM

## 2021-02-16 DIAGNOSIS — G40409 Other generalized epilepsy and epileptic syndromes, not intractable, without status epilepticus: Secondary | ICD-10-CM | POA: Diagnosis not present

## 2021-02-16 DIAGNOSIS — R2681 Unsteadiness on feet: Secondary | ICD-10-CM | POA: Diagnosis not present

## 2021-02-16 DIAGNOSIS — R278 Other lack of coordination: Secondary | ICD-10-CM

## 2021-02-16 DIAGNOSIS — M6281 Muscle weakness (generalized): Secondary | ICD-10-CM | POA: Diagnosis not present

## 2021-02-16 DIAGNOSIS — R2689 Other abnormalities of gait and mobility: Secondary | ICD-10-CM | POA: Diagnosis not present

## 2021-02-16 DIAGNOSIS — R270 Ataxia, unspecified: Secondary | ICD-10-CM

## 2021-02-16 DIAGNOSIS — G9340 Encephalopathy, unspecified: Secondary | ICD-10-CM | POA: Diagnosis not present

## 2021-02-16 NOTE — Therapy (Addendum)
[facility]. [place], [place], [address] Phone: [phone]   Fax:  [phone]  Physical Therapy Treatment Progress Note Reporting Period 01/04/2021 to 02/16/2021  See note below for Objective Data and Assessment of Progress/Goals.      Patient Details  Name: Kristine Francis MRN: [id number] Date of Birth: 01/16/1941 Referring Provider (PT): Cathlyn Parsons, PA-C   Encounter Date: 02/16/2021   PT End of Session - 02/16/21 1148    Visit Number 10    Number of Visits 17    Date for PT Re-Evaluation 03/01/21    PT Start Time 3559    PT Stop Time 1058    PT Time Calculation (min) 43 min    Activity Tolerance Patient tolerated treatment well    Behavior During Therapy [facility] for tasks assessed/performed           Past Medical History:  Diagnosis Date  . Allergy   . Asthma    sees Dr. Tiajuana Amass - as child per pt  . At high risk for falls    unstable gait   . Ataxia   . Cataract    removed bilaterally   . Diverticulosis   . Emphysema   . Gait abnormality 05/14/2013   Kristine Francis a patient of Dr. Linda Hedges presented first interpreter thousand 13 with a gait dysfunction, she also had an episodic confusion and Loss device. Exam found a mild nystagmus and she was referred to ophthalmology on 03-2812, brain MRI was normal nystagmus was a secondary diagnosis to vestibulitis, ear nose and throat has followed and had seen the patient. At the time of the nystagmus the patie  . GERD (gastroesophageal reflux disease)   . Hepatic steatosis   . Hyperlipidemia    on atorvastatin   . Hypertension    on losartan   . Iron deficiency anemia, unspecified   . Kidney cysts 11/16/12   small cyst on left  . Nystagmus, end-position   . Osteopenia   . Renal cyst   . Stroke ([place]) 01/2017   mild     Past Surgical History:  Procedure Laterality Date  . ANAL RECTAL MANOMETRY N/A 12/20/2017   Procedure: ANO RECTAL MANOMETRY;   Surgeon: Mauri Pole, MD;  Location: [facility];  Service: Endoscopy;  Laterality: N/A;  . BREAST BIOPSY Bilateral 1970's  . CATARACT EXTRACTION Bilateral LT:02/14/13,RT:02/21/13  . CERVICAL SPINE SURGERY  2010  . COLONOSCOPY  03-12-14   per Dr. Olevia Perches, diverticulosis only, repeat 5 yrs   . EYE SURGERY Bilateral 01/2013&02/2013   left then right  . HERNIA REPAIR  Oct '14-left, remote-right   years ago right; left done '14  . SKIN CANCER EXCISION  05/13/13   FACE, basal cell  . TONSILLECTOMY      There were no vitals filed for this visit.   Subjective Assessment - 02/16/21 1027    Subjective Pt initially reported no falls but husband states she fell when putting weight through the back of the recliner two nights ago. Denies injury or hitting head.    Currently in Pain? No/denies              [facility] PT Assessment - 02/16/21 0001      Strength   Right Hip Flexion 5/5    Left Hip Flexion 5/5    Right Knee Flexion 5/5    Right Knee Extension 5/5    Left Knee Flexion 5/5    Left Knee Extension  5/5      Transfers   Five time sit to stand comments  20.74 sec no UE assist mod cuing for sequencing      Berg Balance Test   Sit to Stand Able to stand  independently using hands    Standing Unsupported Able to stand 2 minutes with supervision    Sitting with Back Unsupported but Feet Supported on Floor or Stool Able to sit safely and securely 2 minutes    Stand to Sit Uses backs of legs against chair to control descent    Transfers Able to transfer with verbal cueing and /or supervision    Standing Unsupported with Eyes Closed Able to stand 10 seconds with supervision    Standing Unsupported with Feet Together Needs help to attain position but able to stand for 30 seconds with feet together    From Standing, Reach Forward with Outstretched Arm Reaches forward but needs supervision    From Standing Position, Pick up Object from Floor Able to pick up shoe, needs supervision     From Standing Position, Turn to Look Behind Over each Shoulder Needs supervision when turning    Turn 360 Degrees Needs assistance while turning    Standing Unsupported, Alternately Place Feet on Step/Stool Needs assistance to keep from falling or unable to try    Standing Unsupported, One Foot in [employer] balance while stepping or standing    Standing on One Leg Tries to lift leg/unable to hold 3 seconds but remains standing independently    Total Score 24                         [facility] Adult PT Treatment/Exercise - 02/16/21 0001      Ambulation/Gait   Gait Comments gait with RW x100 ft x2 with cues for RW maintained close to body      High Level Balance   High Level Balance Activities Side stepping;Backward walking;Marching forwards    High Level Balance Comments rhomberg no UE assist, fwd/bkwd/sidestepping in II bars, marching in II bars      Knee/Hip Exercises: Seated   Marching Both;10 reps;2 sets    Marching Limitations with no back support focus on keeping core engaged and trunk upright    Sit to Sand 10 reps;without UE support;2 sets   CGA                   PT Short Term Goals - 01/13/21 1511      PT SHORT TERM GOAL #1   Title Pt and caregiver/spouse independent with initiating initial HEP    Baseline spouse reports difficult to get pt to maintain attention for HEP; educated on providing visual cuing    Time 2    Period Weeks    Status On-going    Target Date 01/18/21      PT SHORT TERM GOAL #2   Title Pt will improve five time sit to stand from mat, no UE support to </= 15 seconds but with improved anterior weight shift and decreased posterior LOB    Baseline able to perform with no UE support but does demo lack of anterior weight shift and occasional posterior LOB    Time 4    Period Weeks    Status On-going    Target Date 02/01/21      PT SHORT TERM GOAL #3   Title Pt will demo improved static standing balance to at least 1 minute with  close supervision and minimal sway, without LOB    Baseline ~20 sec this rx    Time 4    Period Weeks    Status On-going    Target Date 02/01/21      PT SHORT TERM GOAL #4   Title Pt and caregiver will demo good understanding of falls prevention and safe functional mobility within the home to prevent falls    Time 2    Period Weeks    Status On-going    Target Date 01/18/21             PT Long Term Goals - 02/16/21 1038      PT LONG TERM GOAL #1   Title Pt will demonstrate independence with final HEP    Time 8    Period Weeks    Status On-going      PT LONG TERM GOAL #2   Title Pt will improve five time sit to stand without use of UE to </= 13 seconds and no posterior LOB    Baseline 20.74 sec; truncal ataxia but no LOB. Mod cuing for sequencing with STS to avoid posterior LOB    Time 8    Period Weeks    Status Partially Met      PT LONG TERM GOAL #3   Title Pt will demo BLE strength at least 4+/5    Baseline 5/5 BLE    Time 8    Period Weeks    Status Achieved      PT LONG TERM GOAL #4   Title Pt will improve BERG by at least 10  points to indicate decreased falls risk    Baseline 24/56: no changes in Berg Score since time of eval    Time 8    Period Weeks    Status On-going      PT LONG TERM GOAL #5   Title Pt will ambulate and complete direction changes with least restrictive assistive device safely without LOB to facilitate improved safety with community negotiation    Baseline pt requires supervision-CGA when ambulating with RW    Time 8    Period Weeks    Status On-going                 Plan - 02/16/21 1148    Clinical Impression Statement Pt demos progress toward LTG related to strength with improved LE MMT from time of eval. Pt has not made progress with Berg Balance score; still demos significant deficits and is at high fall risk. Would benefit from continued LE strengthening and endurance ex's with transition into HEP. Progress with balance  is difficult d/t truncal ataxia and limited carryover from session to session.    PT Treatment/Interventions ADLs/Self Care Home Management;Aquatic Therapy;DME Instruction;Gait training;Stair training;Functional mobility training;Therapeutic activities;Therapeutic exercise;Balance training;Neuromuscular re-education;Patient/family education;Vestibular;Visual/perceptual remediation/compensation;Manual techniques;Taping    PT Next Visit Plan Progressive TE and balance/neurore-ed exercises as tolerated. Frequent cues for safety. Standing balance/vestibular/multi-sensory balance activities, postural exercises, dynamic balance/ balance recovery.    Consulted and Agree with Plan of Care Patient;Family member/caregiver    Family Member Consulted husband           Patient will benefit from skilled therapeutic intervention in order to improve the following deficits and impairments:  Abnormal gait,Decreased balance,Decreased coordination,Decreased strength,Difficulty walking,Impaired tone,Impaired UE functional use,Impaired vision/preception,Postural dysfunction,Decreased mobility,Decreased safety awareness,Decreased endurance  Visit Diagnosis: Muscle weakness (generalized)  Other lack of coordination  Ataxic gait  Unsteadiness on feet     Problem List  Patient Active Problem List   Diagnosis Date Noted  . PAF (paroxysmal atrial fibrillation) ([place]) 12/30/2020  . Heme + stool 12/27/2020  . Hyponatremia 12/27/2020  . Anemia 12/27/2020  . Protein-calorie malnutrition, severe 12/16/2020  . Encephalopathy 12/10/2020  . Grand mal seizure ([place]) 12/02/2020  . COPD (chronic obstructive pulmonary disease) ([place]) 12/02/2020  . Malnutrition of moderate degree 11/28/2020  . Acute encephalopathy   . Acute respiratory failure with hypoxia ([place])   . Aspiration into airway   . Cardiac arrest ([place])   . Groin mass 08/20/2020  . Falls infrequently 03/27/2019  . Sensory neuropathy 02/01/2019  . 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 Cerebellar ataxia ([place]) 08/23/2018  . Sensory polyneuropathy 08/23/2018  . Incontinence of feces   . Visual disturbances 02/06/2017  . Essential hypertension 11/07/2016  . Hyperlipidemia 04/24/2015  . Dysuria 02/11/2015  . Acute cystitis without hematuria 02/11/2015  . Chest pain 02/06/2015  . Left inguinal hernia 08/02/2013  . Gait abnormality 05/14/2013  . Gait disorder   . Nystagmus, end-position   . Routine health maintenance 12/13/2011  . LEG CRAMPS, NOCTURNAL 12/07/2010  . CALLUS, TOE 03/19/2010  . hip pain 03/19/2010  . ATAXIA 11/17/2008  . CERVICAL RADICULOPATHY, RIGHT 10/21/2008  . DIVERTICULOSIS OF COLON 08/14/2008  . RENAL CYST 08/14/2008  . ANEMIA-IRON DEFICIENCY 06/16/2007  . Asthma 06/16/2007  . GERD 06/16/2007  . OSTEOPENIA 06/16/2007   Amador Cunas, PT, DPT Donald Prose Nimra Puccinelli 02/16/2021, 12:58 PM  [facility]. [place], [place], [address] Phone: [phone]   Fax:  [phone]  Name: MALINI FLEMINGS MRN: [id number] Date of Birth: Dec 15, 1940

## 2021-02-16 NOTE — Therapy (Signed)
[facility]. [place], [place], [address] Phone: [phone]   Fax:  [phone]  Occupational Therapy Treatment  Patient Details  Name: Kristine Francis MRN: [id number] Date of Birth: 10-30-1941 Referring Provider (OT): Lauraine Rinne, PA-C   Encounter Date: 02/16/2021   OT End of Session - 02/16/21 1111    Visit Number 12    Number of Visits 13    Date for OT Re-Evaluation 02/22/21    Authorization Type Medicare    Progress Note Due on Visit 42    OT Start Time 1104    OT Stop Time 1147    OT Time Calculation (min) 43 min    Activity Tolerance Patient tolerated treatment well    Behavior During Therapy [facility] for tasks assessed/performed   Confusion          Past Medical History:  Diagnosis Date  . Allergy   . Asthma    sees Dr. Tiajuana Amass - as child per pt  . At high risk for falls    unstable gait   . Ataxia   . Cataract    removed bilaterally   . Diverticulosis   . Emphysema   . Gait abnormality 05/14/2013   Kristine Francis a patient of Dr. Linda Hedges presented first interpreter thousand 13 with a gait dysfunction, she also had an episodic confusion and Loss device. Exam found a mild nystagmus and she was referred to ophthalmology on 03-2812, brain MRI was normal nystagmus was a secondary diagnosis to vestibulitis, ear nose and throat has followed and had seen the patient. At the time of the nystagmus the patie  . GERD (gastroesophageal reflux disease)   . Hepatic steatosis   . Hyperlipidemia    on atorvastatin   . Hypertension    on losartan   . Iron deficiency anemia, unspecified   . Kidney cysts 11/16/12   small cyst on left  . Nystagmus, end-position   . Osteopenia   . Renal cyst   . Stroke ([place]) 01/2017   mild     Past Surgical History:  Procedure Laterality Date  . ANAL RECTAL MANOMETRY N/A 12/20/2017   Procedure: ANO RECTAL MANOMETRY;  Surgeon: Mauri Pole, MD;  Location: [facility];   Service: Endoscopy;  Laterality: N/A;  . BREAST BIOPSY Bilateral 1970's  . CATARACT EXTRACTION Bilateral LT:02/14/13,RT:02/21/13  . CERVICAL SPINE SURGERY  2010  . COLONOSCOPY  03-12-14   per Dr. Olevia Perches, diverticulosis only, repeat 5 yrs   . EYE SURGERY Bilateral 01/2013&02/2013   left then right  . HERNIA REPAIR  Oct '14-left, remote-right   years ago right; left done '14  . SKIN CANCER EXCISION  05/13/13   FACE, basal cell  . TONSILLECTOMY      There were no vitals filed for this visit.   Subjective Assessment - 02/16/21 1110    Subjective  Pt states she had a fall Sunday night; she was getting up from the sofa and started falling, when she reached for the recliner it tipped over    Patient is accompanied by: Family member   Kristine Francis (husband)   Pertinent History CVA (March 2018), ataxia, COPD, osteopenia, neuropathy, gait abnormality, asthma, iron deficiency anemia, GERD, and hx of nystagmus    Patient Stated Goals Get back to being independent; play tennis again    Currently in Pain? No/denies            OT Treatments/Exercises (OP) - 02/16/21 1304  Shoulder Exercises: Seated   Row Strengthening;Right;Left;15 reps;Theraband    Theraband Level (Shoulder Row) Level 2 (Red)    External Rotation Strengthening;Both;15 reps;Theraband    Theraband Level (Shoulder External Rotation) Level 2 (Red)    Flexion Strengthening;Left;Both;15 reps;Theraband   Forward chest press w/ theraband anchored to w/c   Theraband Level (Shoulder Flexion) Level 2 (Red)      Visual/Perceptual Exercises   Other Exercises Trail making on window while standing w/ RW; pt instructed to erase each number in standing to facilitate dynamic standing balance, functional reach, GMC, and eye-hand coordination/targeting; during 2nd trial, activity was graded up to include identifying a food/drink that started w/ the corresponding letter to incorporate cognitive component   Able to maintain standing for 10 min w/out rest  break at [place] - 02/11/21 1751      OT SHORT TERM GOAL #1   Title Pt will be able to complete initial HEP with Min A from caregiver    Baseline No current HEP    Time 3    Period Weeks    Status Partially Met   02/03/21 - pt is able to complete initial HEP but spouse reports non-compliance at home   Target Date 01/26/21      OT SHORT TERM GOAL #2   Title Pt will be able to complete asymmetrical bilateral coordination activities independently in at least 3/4 trials to improve functional UE use during ADLs.    Baseline Decreased BUE GM coordination    Time 3    Period Weeks    Status Achieved   02/11/21 - completed asymmetrical bilateral coordination during [facility] activity w/out difficulty in multiple trials           OT Long Term Goals - 02/09/21 1753      OT LONG TERM GOAL #1   Title Pt will complete HEP designed for BUE strength and coordination with Mod I and report carryover to home    Baseline No current HEP    Time 6    Period Weeks    Status Not Met   02/09/21 - spouse reports pt does not complete any of her exercises at home     OT LONG TERM GOAL #2   Title Pt will demonstrate preparing a simple meal safely with Min A    Baseline Currently unable to prepare meals safely    Time 6    Period Weeks    Status On-going      OT LONG TERM GOAL #3   Title Pt will be able to safely complete UB/LB dressing with Mod I at least 75% of the time    Baseline Currently SPV/SBA for dressing    Time 6    Period Weeks    Status Achieved   02/09/21 - pt and her spouse report she is able to get her clothes out and get herself dressed w/ Mod I     OT LONG TERM GOAL #4   Title Pt will be able to manipulate clothing fasteners without assistance in at least 4/5 trials    Baseline Decreased [place] as evidenced by time on 9-Hole Peg Test    Time 6    Period Weeks    Status On-going      OT LONG TERM GOAL #5   Title Pt will verbalize understanding of at least 3  compensatory/safety strategies to improve safety with ADLs and functional recall  Baseline Decreased safety awareness and recall; no current compensatory strategies for ADLs    Time 6    Period Weeks    Status Not Met   02/09/21 - pt unable to recall any energy conservation/safety strategies           Plan - 02/16/21 1112    Clinical Impression Statement OT facilitated dynamic standing balance w/ pt positioned standing at RW; pt able to maintain balance w/ CGA for 10 minutes without requiring rest break. Pt also demonstrated improved attention this session as evidenced by being able to find all letters on surface, name a food/drink starting w/ that letter, and then erasing the target with minimal cues to recall which letter she had just found.    OT Occupational Profile and History Detailed Assessment- Review of Records and additional review of physical, cognitive, psychosocial history related to current functional performance    Occupational performance deficits (Please refer to evaluation for details): ADL's;IADL's;Leisure    Body Structure / Function / Physical Skills ADL;Decreased knowledge of precautions;ROM;UE functional use;FMC;Dexterity;Body mechanics;Decreased knowledge of use of DME;Balance;Vision;GMC;Pain;Strength;IADL;Coordination    Cognitive Skills Attention;Problem Solve;Safety Awareness    Rehab Potential Good    Clinical Decision Making Several treatment options, min-mod task modification necessary    Comorbidities Affecting Occupational Performance: Presence of comorbidities impacting occupational performance    Modification or Assistance to Complete Evaluation  Min-Moderate modification of tasks or assist with assess necessary to complete eval    OT Frequency 2x / week    OT Duration 6 weeks    OT Treatment/Interventions Self-care/ADL training;Therapeutic exercise;Visual/perceptual remediation/compensation;Aquatic Therapy;Moist Heat;Neuromuscular education;Patient/family  education;Energy conservation;Therapeutic activities;Fluidtherapy;Cryotherapy;DME and/or AE instruction;Manual Therapy;Passive range of motion;Cognitive remediation/compensation    Plan FMC/shoe tying; UB strengthening    Consulted and Agree with Plan of Care Patient;Family member/caregiver    Family Member Consulted Husband Kristine Francis)           Patient will benefit from skilled therapeutic intervention in order to improve the following deficits and impairments:   Body Structure / Function / Physical Skills: ADL,Decreased knowledge of precautions,ROM,UE functional use,FMC,Dexterity,Body mechanics,Decreased knowledge of use of DME,Balance,Vision,GMC,Pain,Strength,IADL,Coordination Cognitive Skills: Attention,Problem Solve,Safety Awareness     Visit Diagnosis: Ataxia  Muscle weakness (generalized)  Other lack of coordination  Attention and concentration deficit    Problem List Patient Active Problem List   Diagnosis Date Noted  . PAF (paroxysmal atrial fibrillation) ([place]) 12/30/2020  . Heme + stool 12/27/2020  . Hyponatremia 12/27/2020  . Anemia 12/27/2020  . Protein-calorie malnutrition, severe 12/16/2020  . Encephalopathy 12/10/2020  . Grand mal seizure ([place]) 12/02/2020  . COPD (chronic obstructive pulmonary disease) ([place]) 12/02/2020  . Malnutrition of moderate degree 11/28/2020  . Acute encephalopathy   . Acute respiratory failure with hypoxia ([place])   . Aspiration into airway   . Cardiac arrest ([place])   . Groin mass 08/20/2020  . Falls infrequently 03/27/2019  . Sensory neuropathy 02/01/2019  . 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 Cerebellar ataxia ([place]) 08/23/2018  . Sensory polyneuropathy 08/23/2018  . Incontinence of feces   . Visual disturbances 02/06/2017  . Essential hypertension 11/07/2016  . Hyperlipidemia 04/24/2015  . Dysuria 02/11/2015  . Acute cystitis without hematuria 02/11/2015  . Chest pain 02/06/2015  . Left inguinal hernia 08/02/2013  . Gait  abnormality 05/14/2013  . Gait disorder   . Nystagmus, end-position   . Routine health maintenance 12/13/2011  . LEG CRAMPS, NOCTURNAL 12/07/2010  . CALLUS, TOE 03/19/2010  . hip pain 03/19/2010  . ATAXIA 11/17/2008  .  CERVICAL RADICULOPATHY, RIGHT 10/21/2008  . DIVERTICULOSIS OF COLON 08/14/2008  . RENAL CYST 08/14/2008  . ANEMIA-IRON DEFICIENCY 06/16/2007  . Asthma 06/16/2007  . GERD 06/16/2007  . OSTEOPENIA 06/16/2007     Kathrine Cords, OTR/L, MSOT 02/16/2021, 1:11 PM  [facility]. [place], [place], [address] Phone: [phone]   Fax:  [phone]  Name: TRAEH MILROY MRN: [id number] Date of Birth: 1941-03-08

## 2021-02-18 ENCOUNTER — Ambulatory Visit: Payer: Medicare Other | Admitting: Speech Pathology

## 2021-02-18 ENCOUNTER — Encounter: Payer: Self-pay | Admitting: Physical Therapy

## 2021-02-18 ENCOUNTER — Ambulatory Visit: Payer: Medicare Other | Admitting: Physical Therapy

## 2021-02-18 ENCOUNTER — Encounter: Payer: Self-pay | Admitting: Occupational Therapy

## 2021-02-18 ENCOUNTER — Ambulatory Visit: Payer: Medicare Other | Admitting: Occupational Therapy

## 2021-02-18 ENCOUNTER — Encounter: Payer: Self-pay | Admitting: Speech Pathology

## 2021-02-18 ENCOUNTER — Other Ambulatory Visit: Payer: Self-pay

## 2021-02-18 DIAGNOSIS — R41840 Attention and concentration deficit: Secondary | ICD-10-CM

## 2021-02-18 DIAGNOSIS — R260 Ataxic gait: Secondary | ICD-10-CM

## 2021-02-18 DIAGNOSIS — R270 Ataxia, unspecified: Secondary | ICD-10-CM

## 2021-02-18 DIAGNOSIS — M6281 Muscle weakness (generalized): Secondary | ICD-10-CM

## 2021-02-18 DIAGNOSIS — G9340 Encephalopathy, unspecified: Secondary | ICD-10-CM | POA: Diagnosis not present

## 2021-02-18 DIAGNOSIS — G40409 Other generalized epilepsy and epileptic syndromes, not intractable, without status epilepticus: Secondary | ICD-10-CM | POA: Diagnosis not present

## 2021-02-18 DIAGNOSIS — R41841 Cognitive communication deficit: Secondary | ICD-10-CM

## 2021-02-18 DIAGNOSIS — R278 Other lack of coordination: Secondary | ICD-10-CM

## 2021-02-18 DIAGNOSIS — R2689 Other abnormalities of gait and mobility: Secondary | ICD-10-CM | POA: Diagnosis not present

## 2021-02-18 DIAGNOSIS — R2681 Unsteadiness on feet: Secondary | ICD-10-CM | POA: Diagnosis not present

## 2021-02-18 NOTE — Therapy (Signed)
[address] [place], [place], [address] Phone: [phone]   Fax:  [phone]  Physical Therapy Treatment  Patient Details  Name: Kristine Francis MRN: [id number] Date of Birth: 1941/07/25 Referring Provider (PT): Cathlyn Parsons, PA-C   Encounter Date: 02/18/2021   PT End of Session - 02/18/21 1624    Visit Number 11    Number of Visits 17    Date for PT Re-Evaluation 03/01/21    PT Start Time 1529    PT Stop Time 1613    PT Time Calculation (min) 44 min    Activity Tolerance Patient tolerated treatment well    Behavior During Therapy [facility] for tasks assessed/performed           Past Medical History:  Diagnosis Date  . Allergy   . Asthma    sees Dr. Tiajuana Amass - as child per pt  . At high risk for falls    unstable gait   . Ataxia   . Cataract    removed bilaterally   . Diverticulosis   . Emphysema   . Gait abnormality 05/14/2013   Ms.Bosler a patient of Dr. Linda Hedges presented first interpreter thousand 13 with a gait dysfunction, she also had an episodic confusion and Loss device. Exam found a mild nystagmus and she was referred to ophthalmology on 03-2812, brain MRI was normal nystagmus was a secondary diagnosis to vestibulitis, ear nose and throat has followed and had seen the patient. At the time of the nystagmus the patie  . GERD (gastroesophageal reflux disease)   . Hepatic steatosis   . Hyperlipidemia    on atorvastatin   . Hypertension    on losartan   . Iron deficiency anemia, unspecified   . Kidney cysts 11/16/12   small cyst on left  . Nystagmus, end-position   . Osteopenia   . Renal cyst   . Stroke ([place]) 01/2017   mild     Past Surgical History:  Procedure Laterality Date  . ANAL RECTAL MANOMETRY N/A 12/20/2017   Procedure: ANO RECTAL MANOMETRY;  Surgeon: Mauri Pole, MD;  Location: [facility];  Service: Endoscopy;  Laterality: N/A;  . BREAST BIOPSY Bilateral 1970's  .  CATARACT EXTRACTION Bilateral LT:02/14/13,RT:02/21/13  . CERVICAL SPINE SURGERY  2010  . COLONOSCOPY  03-12-14   per Dr. Olevia Perches, diverticulosis only, repeat 5 yrs   . EYE SURGERY Bilateral 01/2013&02/2013   left then right  . HERNIA REPAIR  Oct '14-left, remote-right   years ago right; left done '14  . SKIN CANCER EXCISION  05/13/13   FACE, basal cell  . TONSILLECTOMY      There were no vitals filed for this visit.   Subjective Assessment - 02/18/21 1533    Subjective Pt reports no new changes since last rx.    Currently in Pain? No/denies                             [facility] Adult PT Treatment/Exercise - 02/18/21 1533      Ambulation/Gait   Gait Comments gait with RW x100 ft x2 with cues for RW maintained close to body      High Level Balance   High Level Balance Activities Side stepping;Backward walking;Marching forwards;Marching backwards    High Level Balance Comments rhomberg no UE assist, fwd/bkwd/sidestepping in II bars, marching in II bars      Knee/Hip Exercises: Aerobic  Nustep L5 x 8 min      Knee/Hip Exercises: Standing   Other Standing Knee Exercises standing marches with BUE support on walker      Knee/Hip Exercises: Seated   Long Arc Quad Both;2 sets;10 reps    Long Arc Quad Limitations 2.5#    Marching Both;10 reps;2 Firefighter Limitations with no back support focus on keeping core engaged and trunk upright; 2.5#    Hamstring Curl Both;2 sets;10 reps    Hamstring Limitations red tb    Sit to [employer] 20 reps;without UE support                    PT Short Term Goals - 01/13/21 1511      PT SHORT TERM GOAL #1   Title Pt and caregiver/spouse independent with initiating initial HEP    Baseline spouse reports difficult to get pt to maintain attention for HEP; educated on providing visual cuing    Time 2    Period Weeks    Status On-going    Target Date 01/18/21      PT SHORT TERM GOAL #2   Title Pt will improve five time sit  to stand from mat, no UE support to </= 15 seconds but with improved anterior weight shift and decreased posterior LOB    Baseline able to perform with no UE support but does demo lack of anterior weight shift and occasional posterior LOB    Time 4    Period Weeks    Status On-going    Target Date 02/01/21      PT SHORT TERM GOAL #3   Title Pt will demo improved static standing balance to at least 1 minute with close supervision and minimal sway, without LOB    Baseline ~20 sec this rx    Time 4    Period Weeks    Status On-going    Target Date 02/01/21      PT SHORT TERM GOAL #4   Title Pt and caregiver will demo good understanding of falls prevention and safe functional mobility within the home to prevent falls    Time 2    Period Weeks    Status On-going    Target Date 01/18/21             PT Long Term Goals - 02/16/21 1038      PT LONG TERM GOAL #1   Title Pt will demonstrate independence with final HEP    Time 8    Period Weeks    Status On-going      PT LONG TERM GOAL #2   Title Pt will improve five time sit to stand without use of UE to </= 13 seconds and no posterior LOB    Baseline 20.74 sec; truncal ataxia but no LOB. Mod cuing for sequencing with STS to avoid posterior LOB    Time 8    Period Weeks    Status Partially Met      PT LONG TERM GOAL #3   Title Pt will demo BLE strength at least 4+/5    Baseline 5/5 BLE    Time 8    Period Weeks    Status Achieved      PT LONG TERM GOAL #4   Title Pt will improve BERG by at least 10  points to indicate decreased falls risk    Baseline 24/56: no changes in Berg Score since time of eval  Time 8    Period Weeks    Status On-going      PT LONG TERM GOAL #5   Title Pt will ambulate and complete direction changes with least restrictive assistive device safely without LOB to facilitate improved safety with community negotiation    Baseline pt requires supervision-CGA when ambulating with RW    Time 8     Period Weeks    Status On-going                 Plan - 02/18/21 1625    Clinical Impression Statement Discussed progress with pt spouse and agreed to decrease frequency of PT to 1x/week. Pt does demo some carryover this session with sequencing STS. CGA-minA for high level balance ex's. Difficulty with standing balance and reaching out of BOS d/t truncal ataxia. Cues with seated ex's to maintain upright posture without back support to improve core control. Plan to continue with functional LE strengthening along with balance ex's.    PT Treatment/Interventions ADLs/Self Care Home Management;Aquatic Therapy;DME Instruction;Gait training;Stair training;Functional mobility training;Therapeutic activities;Therapeutic exercise;Balance training;Neuromuscular re-education;Patient/family education;Vestibular;Visual/perceptual remediation/compensation;Manual techniques;Taping    PT Next Visit Plan Progressive TE and balance/neurore-ed exercises as tolerated. Frequent cues for safety. Standing balance/vestibular/multi-sensory balance activities, postural exercises, dynamic balance/ balance recovery.    Consulted and Agree with Plan of Care Patient;Family member/caregiver    Family Member Consulted husband           Patient will benefit from skilled therapeutic intervention in order to improve the following deficits and impairments:  Abnormal gait,Decreased balance,Decreased coordination,Decreased strength,Difficulty walking,Impaired tone,Impaired UE functional use,Impaired vision/preception,Postural dysfunction,Decreased mobility,Decreased safety awareness,Decreased endurance  Visit Diagnosis: Muscle weakness (generalized)  Other lack of coordination  Ataxic gait     Problem List Patient Active Problem List   Diagnosis Date Noted  . PAF (paroxysmal atrial fibrillation) ([place]) 12/30/2020  . Heme + stool 12/27/2020  . Hyponatremia 12/27/2020  . Anemia 12/27/2020  . Protein-calorie  malnutrition, severe 12/16/2020  . Encephalopathy 12/10/2020  . Grand mal seizure ([place]) 12/02/2020  . COPD (chronic obstructive pulmonary disease) ([place]) 12/02/2020  . Malnutrition of moderate degree 11/28/2020  . Acute encephalopathy   . Acute respiratory failure with hypoxia ([place])   . Aspiration into airway   . Cardiac arrest ([place])   . Groin mass 08/20/2020  . Falls infrequently 03/27/2019  . Sensory neuropathy 02/01/2019  . 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 Cerebellar ataxia ([place]) 08/23/2018  . Sensory polyneuropathy 08/23/2018  . Incontinence of feces   . Visual disturbances 02/06/2017  . Essential hypertension 11/07/2016  . Hyperlipidemia 04/24/2015  . Dysuria 02/11/2015  . Acute cystitis without hematuria 02/11/2015  . Chest pain 02/06/2015  . Left inguinal hernia 08/02/2013  . Gait abnormality 05/14/2013  . Gait disorder   . Nystagmus, end-position   . Routine health maintenance 12/13/2011  . LEG CRAMPS, NOCTURNAL 12/07/2010  . CALLUS, TOE 03/19/2010  . hip pain 03/19/2010  . ATAXIA 11/17/2008  . CERVICAL RADICULOPATHY, RIGHT 10/21/2008  . DIVERTICULOSIS OF COLON 08/14/2008  . RENAL CYST 08/14/2008  . ANEMIA-IRON DEFICIENCY 06/16/2007  . Asthma 06/16/2007  . GERD 06/16/2007  . OSTEOPENIA 06/16/2007   Amador Cunas, PT, DPT Donald Prose Selene Peltzer 02/18/2021, 4:27 PM  [address]. [place], [place], [address] Phone: [phone]   Fax:  [phone]  Name: Kristine Francis MRN: [id number] Date of Birth: [birth year]

## 2021-02-18 NOTE — Therapy (Signed)
[address]. [place], [place], [address] Phone: [phone]   Fax:  [phone]  Speech Language Pathology Treatment & Discharge Summary  Patient Details  Name: Kristine Francis MRN: [id number] Date of Birth: [birth year] No data recorded  Encounter Date: 02/18/2021   End of Session - 02/18/21 1440    Visit Number 11    Number of Visits 25    Date for SLP Re-Evaluation 04/03/21    SLP Start Time 6    SLP Stop Time  1440    SLP Time Calculation (min) 40 min    Activity Tolerance Patient tolerated treatment well           Past Medical History:  Diagnosis Date  . Allergy   . Asthma    sees Dr. Tiajuana Amass - as child per pt  . At high risk for falls    unstable gait   . Ataxia   . Cataract    removed bilaterally   . Diverticulosis   . Emphysema   . Gait abnormality 05/14/2013   Ms.Streed a patient of Dr. Linda Hedges presented first interpreter thousand 13 with a gait dysfunction, she also had an episodic confusion and Loss device. Exam found a mild nystagmus and she was referred to ophthalmology on 03-2812, brain MRI was normal nystagmus was a secondary diagnosis to vestibulitis, ear nose and throat has followed and had seen the patient. At the time of the nystagmus the patie  . GERD (gastroesophageal reflux disease)   . Hepatic steatosis   . Hyperlipidemia    on atorvastatin   . Hypertension    on losartan   . Iron deficiency anemia, unspecified   . Kidney cysts 11/16/12   small cyst on left  . Nystagmus, end-position   . Osteopenia   . Renal cyst   . Stroke ([place]) 01/2017   mild     Past Surgical History:  Procedure Laterality Date  . ANAL RECTAL MANOMETRY N/A 12/20/2017   Procedure: ANO RECTAL MANOMETRY;  Surgeon: Mauri Pole, MD;  Location: [facility];  Service: Endoscopy;  Laterality: N/A;  . BREAST BIOPSY Bilateral 1970's  . CATARACT EXTRACTION Bilateral LT:02/14/13,RT:02/21/13  . CERVICAL  SPINE SURGERY  2010  . COLONOSCOPY  03-12-14   per Dr. Olevia Perches, diverticulosis only, repeat 5 yrs   . EYE SURGERY Bilateral 01/2013&02/2013   left then right  . HERNIA REPAIR  Oct '14-left, remote-right   years ago right; left done '14  . SKIN CANCER EXCISION  05/13/13   FACE, basal cell  . TONSILLECTOMY      There were no vitals filed for this visit.   Subjective Assessment - 02/18/21 1404    Subjective "I don't take really any medicine." Husband: "Yes, you do! You take it twice a day."    Patient is accompained by: Family member    Currently in Pain? No/denies                 ADULT SLP TREATMENT - 02/18/21 1414      General Information   Behavior/Cognition Alert;Cooperative;Pleasant mood;Requires cueing      Treatment Provided   Treatment provided Cognitive-Linquistic      Cognitive-Linquistic Treatment   Treatment focused on Cognition    Skilled Treatment Discussed use of memory journal again today. Also recorded voice to remind pt of her promise to try thickener in her coffee. Attempted use of memory strategies. Pt has not made much progress and continues  to require prompting to use memory strategies and follow recommendations.      Assessment / Recommendations / Plan   Plan Discharge SLP treatment due to (comment)   lack of progress and follow-through at home.     Progression Toward Goals   Progression toward goals Not progressing toward goals (comment)              SLP Short Term Goals - 02/18/21 1616      SLP SHORT TERM GOAL #1   Title Patient will tell SLP 3 areas of deficit/challenge for her given 2 verbal cues.    Time 3    Period Weeks    Status Partially Met      SLP SHORT TERM GOAL #2   Title Patient will recall personally-relevant information post >5 minute delay.    Baseline <5 min    Time 3    Period Weeks    Status Not Met      SLP SHORT TERM GOAL #3   Title The patient will complete swallowing maneuvers (supraglottic swallow, Mendelson  maneuver, effortful swallow, etc.) to improve oral motor weakness, tongue base retraction, hyolaryngeal excursion, airway protection, and clearance of the bolus through the pharynx with moderate verbal, visual and tactile cues.    Time 3    Period Weeks    Status Deferred   Goal deferred due to lack of compliance.     SLP SHORT TERM GOAL #4   Title Pt and family will complete memory book page prior to coming to therapy to assist with memory of important information.    Time 4    Period Weeks    Status Not Met            SLP Long Term Goals - 02/18/21 1616      SLP LONG TERM GOAL #1   Title Client will utilize compensatory strategies with optimum safety and efficiency of swallowing function of P.O. intake without overt signs and symptoms of aspiration for the highest appropriate diet level.    Baseline honey thick/dysphagia 2/minced and moist    Time 7    Period Weeks    Status Not Met      SLP LONG TERM GOAL #2   Title Patient will demonstrate use of memory strategies to schedule activities, recall weekly events and items to maintain safety to participate socially in functional living environment    Time 8    Period Weeks    Status Not Met      SLP LONG TERM GOAL #3   Title Patient will demonstrate use of information processing and self monitoring during daily living activities to improve safety and awareness in functional living environment    Time 8    Period Weeks    Status Not Met      SLP LONG TERM GOAL #4   Title Patient will develop functional attention skills to effectively attend to and communicate in tasks of daily living in their functional living environment.    Time 8    Period Weeks    Status Partially Met            Plan - 02/18/21 1440    Clinical Impression Statement Pt is a 80 yo female with hx of encephalopathy 2/2 to seizures. Hx of intubation and ventilator use. Dysphagia. Dysarthria. Cognitive deficit. Pt was given the SLUMS which indicated a  deficit in attention, short term memory, problem solving, and executive functioning skills. Pt husband is responsible for  all ADLs due to significant memory deficits. Pt reports she would like to be more self-sufficient with medications and her thinking skills. Previous documentation and MBS rec dysphagia 2 diet and honey thick liquids. SLP to address "minced and moist (IDDSI)" terminology with patient and spouse. Oropharyngeal dysphagia to be addressed in treatment. Dysarthria noted in conversation with impaired articulation. Gurgly vocal quality noted. "Silent aspiration" addressed with patient and spouse. SLP rec skilled speech services to address cognitive-linguistic deficit, dysphagia, and dysarthria treatments to increase quality of life and participation. Adding [employer] **Pt to discharge from services today 02/18/21    Speech Therapy Frequency 1x /week    Duration 12 weeks    Treatment/Interventions Aspiration precaution training;Trials of upgraded texture/liquids;Compensatory strategies;Pharyngeal strengthening exercises;Oral motor exercises;Cueing hierarchy;Functional tasks;Patient/family education;Diet toleration management by SLP;Environmental controls;Cognitive reorganization;Compensatory techniques;Internal/external aids;SLP instruction and feedback    Potential to Achieve Goals Fair    Potential Considerations Ability to learn/carryover information    Consulted and Agree with Plan of Care Patient;Family member/caregiver    Family Member Consulted [name] (spouse)           Patient will benefit from skilled therapeutic intervention in order to improve the following deficits and impairments:   Cognitive communication deficit    Problem List Patient Active Problem List   Diagnosis Date Noted  . PAF (paroxysmal atrial fibrillation) ([place]) 12/30/2020  . Heme + stool 12/27/2020  . Hyponatremia 12/27/2020  . Anemia 12/27/2020  . Protein-calorie malnutrition, severe  12/16/2020  . Encephalopathy 12/10/2020  . Grand mal seizure ([place]) 12/02/2020  . COPD (chronic obstructive pulmonary disease) ([place]) 12/02/2020  . Malnutrition of moderate degree 11/28/2020  . Acute encephalopathy   . Acute respiratory failure with hypoxia ([place])   . Aspiration into airway   . Cardiac arrest ([place])   . Groin mass 08/20/2020  . Falls infrequently 03/27/2019  . Sensory neuropathy 02/01/2019  . 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 Cerebellar ataxia ([place]) 08/23/2018  . Sensory polyneuropathy 08/23/2018  . Incontinence of feces   . Visual disturbances 02/06/2017  . Essential hypertension 11/07/2016  . Hyperlipidemia 04/24/2015  . Dysuria 02/11/2015  . Acute cystitis without hematuria 02/11/2015  . Chest pain 02/06/2015  . Left inguinal hernia 08/02/2013  . Gait abnormality 05/14/2013  . Gait disorder   . Nystagmus, end-position   . Routine health maintenance 12/13/2011  . LEG CRAMPS, NOCTURNAL 12/07/2010  . CALLUS, TOE 03/19/2010  . hip pain 03/19/2010  . ATAXIA 11/17/2008  . CERVICAL RADICULOPATHY, RIGHT 10/21/2008  . DIVERTICULOSIS OF COLON 08/14/2008  . RENAL CYST 08/14/2008  . ANEMIA-IRON DEFICIENCY 06/16/2007  . Asthma 06/16/2007  . GERD 06/16/2007  . OSTEOPENIA 06/16/2007   SPEECH THERAPY DISCHARGE SUMMARY  Visits from Start of Care: 11 Current functional level related to goals / functional outcomes: Pt has made limited improvement since being in therapy. Due to severity of memory deficits, patient is unable to carry-over skills/strategies learned in therapy. Caregiver reports that he attempts to assist at home, but patient is noncompliant with SLP recommendations.    Remaining deficits: Oropharyngeal dysphagia - honey thick/minced and moist Severe cognitive-communication impairment - short term memory most impacted.   Education / Equipment: Education provided to patient and husband. They are in agreement with the plan to discharge due to limited  carryover and noncompliance.  Plan: Patient agrees to discharge.  Patient goals were not met. Patient is being discharged due to lack of progress.  ?????      Verdene Lennert MS,  CCC-SLP, CBIS  02/18/2021, 4:18 PM  [address]. [place], [place], [address] Phone: [phone]   Fax:  [phone]   Name: EULALAH RUPERT MRN: [id number] Date of Birth: 02/20/1941

## 2021-02-18 NOTE — Therapy (Signed)
[place] [place], [place], [address] Phone: [phone]   Fax:  [phone]  Occupational Therapy Treatment & Re-Certification  Patient Details  Name: Kristine Francis MRN: [id number] Date of Birth: 01-25-1941 Referring Provider (OT): Lauraine Rinne, PA-C   Encounter Date: 02/18/2021   OT End of Session - 02/18/21 1451    Visit Number 13    Number of Visits 17    Date for OT Re-Evaluation 03/24/21    Authorization Type Medicare    Progress Note Due on Visit 57    OT Start Time 1445    OT Stop Time 1529    OT Time Calculation (min) 44 min    Activity Tolerance Patient tolerated treatment well    Behavior During Therapy [facility] for tasks assessed/performed   Confusion          Past Medical History:  Diagnosis Date  . Allergy   . Asthma    sees Dr. Tiajuana Amass - as child per pt  . At high risk for falls    unstable gait   . Ataxia   . Cataract    removed bilaterally   . Diverticulosis   . Emphysema   . Gait abnormality 05/14/2013   Ms.Buch a patient of Dr. Linda Hedges presented first interpreter thousand 13 with a gait dysfunction, she also had an episodic confusion and Loss device. Exam found a mild nystagmus and she was referred to ophthalmology on 03-2812, brain MRI was normal nystagmus was a secondary diagnosis to vestibulitis, ear nose and throat has followed and had seen the patient. At the time of the nystagmus the patie  . GERD (gastroesophageal reflux disease)   . Hepatic steatosis   . Hyperlipidemia    on atorvastatin   . Hypertension    on losartan   . Iron deficiency anemia, unspecified   . Kidney cysts 11/16/12   small cyst on left  . Nystagmus, end-position   . Osteopenia   . Renal cyst   . Stroke ([place]) 01/2017   mild     Past Surgical History:  Procedure Laterality Date  . ANAL RECTAL MANOMETRY N/A 12/20/2017   Procedure: ANO RECTAL MANOMETRY;  Surgeon: Mauri Pole, MD;   Location: [facility];  Service: Endoscopy;  Laterality: N/A;  . BREAST BIOPSY Bilateral 1970's  . CATARACT EXTRACTION Bilateral LT:02/14/13,RT:02/21/13  . CERVICAL SPINE SURGERY  2010  . COLONOSCOPY  03-12-14   per Dr. Olevia Perches, diverticulosis only, repeat 5 yrs   . EYE SURGERY Bilateral 01/2013&02/2013   left then right  . HERNIA REPAIR  Oct '14-left, remote-right   years ago right; left done '14  . SKIN CANCER EXCISION  05/13/13   FACE, basal cell  . TONSILLECTOMY      There were no vitals filed for this visit.   Subjective Assessment - 02/18/21 1450    Subjective  Pt and spouse were agreeable to decreasing frequency to 1x/week    Patient is accompanied by: Family member   [name] (husband)   Pertinent History CVA (March 2018), ataxia, COPD, osteopenia, neuropathy, gait abnormality, asthma, iron deficiency anemia, GERD, and hx of nystagmus    Patient Stated Goals Get back to being independent; play tennis again    Currently in Pain? No/denies            OT Treatments/Exercises (OP) - 02/18/21 1530      ADLs   LB Dressing Tying drawstrings on pair of pants; able to successfully  tie a bow in 2/2 trials w/ extra time. Pt also completed shoe tying and was able to tie R shoelaces in 2/2 trials w/ extra time, requiring Min A w/ L shoelaces    Increased Safety Strategies OT provided education on fall prevention strategies to pt and her spouse, discussing potential environmental modifications that may be made to decrease risk for falls      Shoulder Exercises: Seated   Other Seated Exercises BUE overhead press, chest press, bicep curls, and wrist extension w/ 2lb dowel rod completed for strengthening; completed 15x each w/ OT providing verbal and tactile cues during shoulder press exercises to encourage full range, as well as verbal cueing and blocking at the elbow to isolate wrist extension during wrist strengthening            OT Education - 02/18/21 2147    Education Details Education  provided on fall prevention and home modification; handout to be provided    Person(s) Educated Patient;Spouse    Methods Explanation    Comprehension Verbalized understanding;Need further instruction            OT Short Term Goals - 02/11/21 1751      OT SHORT TERM GOAL #1   Title Pt will be able to complete initial HEP with Min A from caregiver    Baseline No current HEP    Time 3    Period Weeks    Status Partially Met   02/03/21 - pt is able to complete initial HEP but spouse reports non-compliance at home   Target Date 01/26/21      OT SHORT TERM GOAL #2   Title Pt will be able to complete asymmetrical bilateral coordination activities independently in at least 3/4 trials to improve functional UE use during ADLs.    Baseline Decreased BUE GM coordination    Time 3    Period Weeks    Status Achieved   02/11/21 - completed asymmetrical bilateral coordination during [facility] activity w/out difficulty in multiple trials           OT Long Term Goals - 02/18/21 1600      OT LONG TERM GOAL #1   Title Pt will complete HEP designed for BUE strength and coordination with Mod I and report carryover to home    Baseline No current HEP    Time 6    Period Weeks    Status Not Met   02/09/21 - spouse reports pt does not complete any of her exercises at home     OT LONG TERM GOAL #2   Title Pt will demonstrate preparing a simple meal safely with Min A    Baseline Currently unable to prepare meals safely    Time 6    Period Weeks    Status On-going      OT LONG TERM GOAL #3   Title Pt will be able to safely complete UB/LB dressing with Mod I at least 75% of the time    Baseline Currently SPV/SBA for dressing    Time 6    Period Weeks    Status Achieved   02/09/21 - pt and her spouse report she is able to get her clothes out and get herself dressed w/ Mod I     OT LONG TERM GOAL #4   Title Pt will be able to manipulate clothing fasteners without assistance in at least 2/3 trials     Baseline Decreased [place] as evidenced by time on 9-Hole Peg  Test    Time 6    Period Weeks    Status On-going      OT LONG TERM GOAL #5   Title Pt's spouse will verbalize understanding of at least 2 compensatory/safety strategies to improve pt's safety with ADLs and aid in fall prevention    Baseline Decreased safety awareness and recall; no current compensatory strategies for ADLs    Time 6    Period Weeks    Status Revised   02/09/21 - pt unable to recall any energy conservation/safety strategies           Plan - 02/18/21 1452    Clinical Impression Statement Pt expressed that returning to tennis was "not realistic," which demonstrated accurate awareness of deficits compared w/ poor awareness observed consistently in previous sessions. OT reviewed goals w/ pt and discussed further functional activities that pt/her husband would like to continue to work on in therapy. Pt will benefit from continued skilled services to address limitations w/ clothing fasteners and manipulatives, endurance during home maintenance taks, and continued education of patient/husband on safety strategies and fall prevention.    OT Occupational Profile and History Detailed Assessment- Review of Records and additional review of physical, cognitive, psychosocial history related to current functional performance    Occupational performance deficits (Please refer to evaluation for details): ADL's;IADL's;Leisure    Body Structure / Function / Physical Skills ADL;Decreased knowledge of precautions;UE functional use;FMC;Dexterity;Body mechanics;Decreased knowledge of use of DME;Balance;Vision;GMC;Strength;IADL;Coordination    Cognitive Skills Safety Awareness    Rehab Potential Good    Clinical Decision Making Several treatment options, min-mod task modification necessary    Comorbidities Affecting Occupational Performance: Presence of comorbidities impacting occupational performance    Modification or Assistance to Complete  Evaluation  Min-Moderate modification of tasks or assist with assess necessary to complete eval    OT Frequency 1x / week    OT Duration 4 weeks    OT Treatment/Interventions Self-care/ADL training;Therapeutic exercise;Visual/perceptual remediation/compensation;Aquatic Therapy;Neuromuscular education;Patient/family education;Energy conservation;Therapeutic activities;DME and/or AE instruction;Manual Therapy;Cognitive remediation/compensation    Plan Clothing fasteners; tennis substitute    Consulted and Agree with Plan of Care Patient;Family member/caregiver    Family Member Consulted Husband [name])           Patient will benefit from skilled therapeutic intervention in order to improve the following deficits and impairments:   Body Structure / Function / Physical Skills: ADL,Decreased knowledge of precautions,UE functional use,FMC,Dexterity,Body mechanics,Decreased knowledge of use of DME,Balance,Vision,GMC,Strength,IADL,Coordination Cognitive Skills: Safety Awareness     Visit Diagnosis: Ataxia - Plan: Ot plan of care cert/re-cert  Muscle weakness (generalized) - Plan: Ot plan of care cert/re-cert  Other lack of coordination - Plan: Ot plan of care cert/re-cert  Attention and concentration deficit - Plan: Ot plan of care cert/re-cert    Problem List Patient Active Problem List   Diagnosis Date Noted  . PAF (paroxysmal atrial fibrillation) ([place]) 12/30/2020  . Heme + stool 12/27/2020  . Hyponatremia 12/27/2020  . Anemia 12/27/2020  . Protein-calorie malnutrition, severe 12/16/2020  . Encephalopathy 12/10/2020  . Grand mal seizure ([place]) 12/02/2020  . COPD (chronic obstructive pulmonary disease) ([place]) 12/02/2020  . Malnutrition of moderate degree 11/28/2020  . Acute encephalopathy   . Acute respiratory failure with hypoxia ([place])   . Aspiration into airway   . Cardiac arrest ([place])   . Groin mass 08/20/2020  . Falls infrequently 03/27/2019  . Sensory neuropathy 02/01/2019   . 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 Cerebellar ataxia ([place]) 08/23/2018  . Sensory  polyneuropathy 08/23/2018  . Incontinence of feces   . Visual disturbances 02/06/2017  . Essential hypertension 11/07/2016  . Hyperlipidemia 04/24/2015  . Dysuria 02/11/2015  . Acute cystitis without hematuria 02/11/2015  . Chest pain 02/06/2015  . Left inguinal hernia 08/02/2013  . Gait abnormality 05/14/2013  . Gait disorder   . Nystagmus, end-position   . Routine health maintenance 12/13/2011  . LEG CRAMPS, NOCTURNAL 12/07/2010  . CALLUS, TOE 03/19/2010  . hip pain 03/19/2010  . ATAXIA 11/17/2008  . CERVICAL RADICULOPATHY, RIGHT 10/21/2008  . DIVERTICULOSIS OF COLON 08/14/2008  . RENAL CYST 08/14/2008  . ANEMIA-IRON DEFICIENCY 06/16/2007  . Asthma 06/16/2007  . GERD 06/16/2007  . OSTEOPENIA 06/16/2007     Kathrine Cords, OTR/L, MSOT 02/18/2021, 3:30 PM  [address]. [place], [place], [address] Phone: [phone]   Fax:  [phone]  Name: Kristine Francis MRN: [id number] Date of Birth: 05-Feb-1941

## 2021-02-24 ENCOUNTER — Ambulatory Visit: Payer: Medicare Other | Attending: Physician Assistant | Admitting: Occupational Therapy

## 2021-02-24 ENCOUNTER — Ambulatory Visit: Payer: Medicare Other | Admitting: Physical Therapy

## 2021-02-24 ENCOUNTER — Other Ambulatory Visit: Payer: Self-pay

## 2021-02-24 ENCOUNTER — Encounter: Payer: Self-pay | Admitting: Physical Therapy

## 2021-02-24 DIAGNOSIS — R2681 Unsteadiness on feet: Secondary | ICD-10-CM | POA: Diagnosis not present

## 2021-02-24 DIAGNOSIS — R260 Ataxic gait: Secondary | ICD-10-CM | POA: Insufficient documentation

## 2021-02-24 DIAGNOSIS — R41840 Attention and concentration deficit: Secondary | ICD-10-CM | POA: Diagnosis not present

## 2021-02-24 DIAGNOSIS — R270 Ataxia, unspecified: Secondary | ICD-10-CM | POA: Insufficient documentation

## 2021-02-24 DIAGNOSIS — M6281 Muscle weakness (generalized): Secondary | ICD-10-CM | POA: Diagnosis not present

## 2021-02-24 DIAGNOSIS — R278 Other lack of coordination: Secondary | ICD-10-CM

## 2021-02-24 NOTE — Therapy (Signed)
[address]. [place], [place], [address] Phone: [phone]   Fax:  [phone]  Occupational Therapy Treatment  Patient Details  Name: Kristine Francis MRN: [id number] Date of Birth: 1941/10/03 Referring Provider (OT): Lauraine Rinne, PA-C   Encounter Date: 02/24/2021   OT End of Session - 02/24/21 1754    Visit Number 14    Number of Visits 17    Date for OT Re-Evaluation 03/24/21    Authorization Type Medicare    Progress Note Due on Visit 26    OT Start Time 1105    OT Stop Time 1148    OT Time Calculation (min) 43 min    Activity Tolerance Patient tolerated treatment well    Behavior During Therapy [facility] for tasks assessed/performed   Confusion          Past Medical History:  Diagnosis Date  . Allergy   . Asthma    sees Dr. Tiajuana Amass - as child per pt  . At high risk for falls    unstable gait   . Ataxia   . Cataract    removed bilaterally   . Diverticulosis   . Emphysema   . Gait abnormality 05/14/2013   [name] a patient of Dr. Linda Hedges presented first interpreter thousand 13 with a gait dysfunction, she also had an episodic confusion and Loss device. Exam found a mild nystagmus and she was referred to ophthalmology on 03-2812, brain MRI was normal nystagmus was a secondary diagnosis to vestibulitis, ear nose and throat has followed and had seen the patient. At the time of the nystagmus the patie  . GERD (gastroesophageal reflux disease)   . Hepatic steatosis   . Hyperlipidemia    on atorvastatin   . Hypertension    on losartan   . Iron deficiency anemia, unspecified   . Kidney cysts 11/16/12   small cyst on left  . Nystagmus, end-position   . Osteopenia   . Renal cyst   . Stroke ([place]) 01/2017   mild     Past Surgical History:  Procedure Laterality Date  . ANAL RECTAL MANOMETRY N/A 12/20/2017   Procedure: ANO RECTAL MANOMETRY;  Surgeon: Mauri Pole, MD;  Location: [facility];   Service: Endoscopy;  Laterality: N/A;  . BREAST BIOPSY Bilateral 1970's  . CATARACT EXTRACTION Bilateral LT:02/14/13,RT:02/21/13  . CERVICAL SPINE SURGERY  2010  . COLONOSCOPY  03-12-14   per Dr. Olevia Perches, diverticulosis only, repeat 5 yrs   . EYE SURGERY Bilateral 01/2013&02/2013   left then right  . HERNIA REPAIR  Oct '14-left, remote-right   years ago right; left done '14  . SKIN CANCER EXCISION  05/13/13   FACE, basal cell  . TONSILLECTOMY      There were no vitals filed for this visit.   Subjective Assessment - 02/24/21 1753    Subjective  "I just think the holes in this pegboard are too small"    Patient is accompanied by: Family member   [name] (husband)   Pertinent History CVA (March 2018), ataxia, COPD, osteopenia, neuropathy, gait abnormality, asthma, iron deficiency anemia, GERD, and hx of nystagmus    Patient Stated Goals Get back to being independent; play tennis again    Currently in Pain? No/denies          OT Treatment: Pegboard activity - pt instructed to copy simple pattern using easy-grip pegs w/ R hand to facilitate hand strength, FM control, dexterity/coordination, and alternating attention. OT  graded activity down due to pt's difficulty w/ pushing pegs into pegboard; stacking pegs on top of one another also facilitated functional overhead reach, RUE coordination, and targeting  Jenga - therapeutic activity used to facilitate force gradation, eye-hand coordination, motor planning, targeting, [place], bilateral integration, and problem-solving     OT Short Term Goals - 02/11/21 1751      OT SHORT TERM GOAL #1   Title Pt will be able to complete initial HEP with Min A from caregiver    Baseline No current HEP    Time 3    Period Weeks    Status Partially Met   02/03/21 - pt is able to complete initial HEP but spouse reports non-compliance at home   Target Date 01/26/21      OT SHORT TERM GOAL #2   Title Pt will be able to complete asymmetrical bilateral coordination  activities independently in at least 3/4 trials to improve functional UE use during ADLs.    Baseline Decreased BUE GM coordination    Time 3    Period Weeks    Status Achieved   02/11/21 - completed asymmetrical bilateral coordination during [facility] activity w/out difficulty in multiple trials           OT Long Term Goals - 02/18/21 1600      OT LONG TERM GOAL #1   Title Pt will complete HEP designed for BUE strength and coordination with Mod I and report carryover to home    Baseline No current HEP    Time 6    Period Weeks    Status Not Met   02/09/21 - spouse reports pt does not complete any of her exercises at home     OT LONG TERM GOAL #2   Title Pt will demonstrate preparing a simple meal safely with Min A    Baseline Currently unable to prepare meals safely    Time 6    Period Weeks    Status On-going      OT LONG TERM GOAL #3   Title Pt will be able to safely complete UB/LB dressing with Mod I at least 75% of the time    Baseline Currently SPV/SBA for dressing    Time 6    Period Weeks    Status Achieved   02/09/21 - pt and her spouse report she is able to get her clothes out and get herself dressed w/ Mod I     OT LONG TERM GOAL #4   Title Pt will be able to manipulate clothing fasteners without assistance in at least 2/3 trials    Baseline Decreased [place] as evidenced by time on 9-Hole Peg Test    Time 6    Period Weeks    Status On-going      OT LONG TERM GOAL #5   Title Pt's spouse will verbalize understanding of at least 2 compensatory/safety strategies to improve pt's safety with ADLs and aid in fall prevention    Baseline Decreased safety awareness and recall; no current compensatory strategies for ADLs    Time 6    Period Weeks    Status Revised   02/09/21 - pt unable to recall any energy conservation/safety strategies           Plan - 02/24/21 1801    Clinical Impression Statement Decreased intellectual awareness continues to be limiting in regards to pt's  motivation and carryover of strategies to home. Due to difficulty experienced w/ pushing easy-grip pegs into  small pegboard, OT graded activity down to have pt follow same pattern but by stacking pegs into a single tower with positive results. Pt also completed modified [place] game, requiring coaching and verbal/visual cues to successfully find appropriate pieces, adjust visual perspective of [place] tower, and for incorporate bilateral integration.    OT Occupational Profile and History Detailed Assessment- Review of Records and additional review of physical, cognitive, psychosocial history related to current functional performance    Occupational performance deficits (Please refer to evaluation for details): ADL's;IADL's;Leisure    Body Structure / Function / Physical Skills ADL;Decreased knowledge of precautions;UE functional use;FMC;Dexterity;Body mechanics;Decreased knowledge of use of DME;Balance;Vision;GMC;Strength;IADL;Coordination    Cognitive Skills Safety Awareness    Rehab Potential Good    Clinical Decision Making Several treatment options, min-mod task modification necessary    Comorbidities Affecting Occupational Performance: Presence of comorbidities impacting occupational performance    Modification or Assistance to Complete Evaluation  Min-Moderate modification of tasks or assist with assess necessary to complete eval    OT Frequency 1x / week    OT Duration 4 weeks    OT Treatment/Interventions Self-care/ADL training;Therapeutic exercise;Visual/perceptual remediation/compensation;Aquatic Therapy;Neuromuscular education;Patient/family education;Energy conservation;Therapeutic activities;DME and/or AE instruction;Manual Therapy;Cognitive remediation/compensation    Plan Clothing fasteners; tennis substitute    Consulted and Agree with Plan of Care Patient;Family member/caregiver    Family Member Consulted Husband [name])           Patient will benefit from skilled therapeutic  intervention in order to improve the following deficits and impairments:   Body Structure / Function / Physical Skills: ADL,Decreased knowledge of precautions,UE functional use,FMC,Dexterity,Body mechanics,Decreased knowledge of use of DME,Balance,Vision,GMC,Strength,IADL,Coordination Cognitive Skills: Safety Awareness     Visit Diagnosis: Ataxia  Muscle weakness (generalized)  Other lack of coordination  Attention and concentration deficit    Problem List Patient Active Problem List   Diagnosis Date Noted  . PAF (paroxysmal atrial fibrillation) ([place]) 12/30/2020  . Heme + stool 12/27/2020  . Hyponatremia 12/27/2020  . Anemia 12/27/2020  . Protein-calorie malnutrition, severe 12/16/2020  . Encephalopathy 12/10/2020  . Grand mal seizure ([place]) 12/02/2020  . COPD (chronic obstructive pulmonary disease) ([place]) 12/02/2020  . Malnutrition of moderate degree 11/28/2020  . Acute encephalopathy   . Acute respiratory failure with hypoxia ([place])   . Aspiration into airway   . Cardiac arrest ([place])   . Groin mass 08/20/2020  . Falls infrequently 03/27/2019  . Sensory neuropathy 02/01/2019  . 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 Cerebellar ataxia ([place]) 08/23/2018  . Sensory polyneuropathy 08/23/2018  . Incontinence of feces   . Visual disturbances 02/06/2017  . Essential hypertension 11/07/2016  . Hyperlipidemia 04/24/2015  . Dysuria 02/11/2015  . Acute cystitis without hematuria 02/11/2015  . Chest pain 02/06/2015  . Left inguinal hernia 08/02/2013  . Gait abnormality 05/14/2013  . Gait disorder   . Nystagmus, end-position   . Routine health maintenance 12/13/2011  . LEG CRAMPS, NOCTURNAL 12/07/2010  . CALLUS, TOE 03/19/2010  . hip pain 03/19/2010  . ATAXIA 11/17/2008  . CERVICAL RADICULOPATHY, RIGHT 10/21/2008  . DIVERTICULOSIS OF COLON 08/14/2008  . RENAL CYST 08/14/2008  . ANEMIA-IRON DEFICIENCY 06/16/2007  . Asthma 06/16/2007  . GERD 06/16/2007  . OSTEOPENIA 06/16/2007      Kathrine Cords, OTR/L, MSOT 02/24/2021, 6:09 PM  [address]. [place], [place], [address] Phone: [phone]   Fax:  [phone]  Name: Kristine Francis MRN: [id number] Date of Birth: 09-09-1941

## 2021-02-24 NOTE — Therapy (Signed)
[facility] [address] [place], [place], [address] Phone: [phone]   Fax:  [phone]  Physical Therapy Treatment  Patient Details  Name: Kristine Francis MRN: [id number] Date of Birth: 12/04/1940 Referring Provider (PT): Angiulli, Daniel J, PA-C   Encounter Date: 02/24/2021   PT End of Session - 02/24/21 1150    Visit Number 12    Number of Visits 17    Date for PT Re-Evaluation 03/01/21    Authorization Type Medicare Primary; [employer] Secondary; 10th visit PN    PT Start Time 1150    PT Stop Time 1230    PT Time Calculation (min) 40 min    Activity Tolerance Patient tolerated treatment well    Behavior During Therapy WFL for tasks assessed/performed           Past Medical History:  Diagnosis Date  . Allergy   . Asthma    sees Dr. Meg Whelan - as child per pt  . At high risk for falls    unstable gait   . Ataxia   . Cataract    removed bilaterally   . Diverticulosis   . Emphysema   . Gait abnormality 05/14/2013   Ms.Asmus a patient of Dr. Norins presented first interpreter thousand 13 with a gait dysfunction, she also had an episodic confusion and Loss device. Exam found a mild nystagmus and she was referred to ophthalmology on 03-2812, brain MRI was normal nystagmus was a secondary diagnosis to vestibulitis, ear nose and throat has followed and had seen the patient. At the time of the nystagmus the patie  . GERD (gastroesophageal reflux disease)   . Hepatic steatosis   . Hyperlipidemia    on atorvastatin   . Hypertension    on losartan   . Iron deficiency anemia, unspecified   . Kidney cysts 11/16/12   small cyst on left  . Nystagmus, end-position   . Osteopenia   . Renal cyst   . Stroke (HCC) 01/2017   mild     Past Surgical History:  Procedure Laterality Date  . ANAL RECTAL MANOMETRY N/A 12/20/2017   Procedure: ANO RECTAL MANOMETRY;  Surgeon: Nandigam, Kavitha V, MD;  Location: [facility];   Service: Endoscopy;  Laterality: N/A;  . BREAST BIOPSY Bilateral 1970's  . CATARACT EXTRACTION Bilateral LT:02/14/13,RT:02/21/13  . CERVICAL SPINE SURGERY  2010  . COLONOSCOPY  03-12-14   per Dr. Brodie, diverticulosis only, repeat 5 yrs   . EYE SURGERY Bilateral 01/2013&02/2013   left then right  . HERNIA REPAIR  Oct '14-left, remote-right   years ago right; left done '14  . SKIN CANCER EXCISION  05/13/13   FACE, basal cell  . TONSILLECTOMY      There were no vitals filed for this visit.   Subjective Assessment - 02/24/21 1151    Subjective Pt reports no new changes since last rx.    Patient is accompained by: Family member    Pertinent History HTN, renal cyst, osteopenia, nystagmus, kidney cysts, iron deficiency anemia, hepatic steatosis, gait abnormality    Limitations Walking;Standing    Patient Stated Goals To walk without falling and to walk more normally    Currently in Pain? No/denies                             [facility] Adult PT Treatment/Exercise - 02/24/21 0001      High Level Balance   High   Level Balance Activities Side stepping;Backward walking    High Level Balance Comments standing with pertebations front/back/side; toe taps on foam with one UE support      Knee/Hip Exercises: Aerobic   Nustep L5 x 8 min      Knee/Hip Exercises: Standing   Heel Raises Both;15 reps    Hip Abduction Both;10 reps    Abduction Limitations in II bars    Other Standing Knee Exercises standing marches with BUE support on bars      Knee/Hip Exercises: Seated   Long Arc Quad Both;2 sets;10 reps    Long Arc Quad Limitations 2.5#    Marching Both;10 reps;2 sets    Marching Limitations 2.5#    Hamstring Curl Both;2 sets;10 reps    Hamstring Limitations red tb    Sit to Sand 20 reps;without UE support                    PT Short Term Goals - 01/13/21 1511      PT SHORT TERM GOAL #1   Title Pt and caregiver/spouse independent with initiating initial HEP     Baseline spouse reports difficult to get pt to maintain attention for HEP; educated on providing visual cuing    Time 2    Period Weeks    Status On-going    Target Date 01/18/21      PT SHORT TERM GOAL #2   Title Pt will improve five time sit to stand from mat, no UE support to </= 15 seconds but with improved anterior weight shift and decreased posterior LOB    Baseline able to perform with no UE support but does demo lack of anterior weight shift and occasional posterior LOB    Time 4    Period Weeks    Status On-going    Target Date 02/01/21      PT SHORT TERM GOAL #3   Title Pt will demo improved static standing balance to at least 1 minute with close supervision and minimal sway, without LOB    Baseline ~20 sec this rx    Time 4    Period Weeks    Status On-going    Target Date 02/01/21      PT SHORT TERM GOAL #4   Title Pt and caregiver will demo good understanding of falls prevention and safe functional mobility within the home to prevent falls    Time 2    Period Weeks    Status On-going    Target Date 01/18/21             PT Long Term Goals - 02/16/21 1038      PT LONG TERM GOAL #1   Title Pt will demonstrate independence with final HEP    Time 8    Period Weeks    Status On-going      PT LONG TERM GOAL #2   Title Pt will improve five time sit to stand without use of UE to </= 13 seconds and no posterior LOB    Baseline 20.74 sec; truncal ataxia but no LOB. Mod cuing for sequencing with STS to avoid posterior LOB    Time 8    Period Weeks    Status Partially Met      PT LONG TERM GOAL #3   Title Pt will demo BLE strength at least 4+/5    Baseline 5/5 BLE    Time 8    Period Weeks    Status Achieved        PT LONG TERM GOAL #4   Title Pt will improve BERG by at least 10  points to indicate decreased falls risk    Baseline 24/56: no changes in Berg Score since time of eval    Time 8    Period Weeks    Status On-going      PT LONG TERM GOAL #5    Title Pt will ambulate and complete direction changes with least restrictive assistive device safely without LOB to facilitate improved safety with community negotiation    Baseline pt requires supervision-CGA when ambulating with RW    Time 8    Period Weeks    Status On-going                 Plan - 02/24/21 1231    Clinical Impression Statement Patient tolerated TE well without complaint. She did well with standing pertebations, but has difficulty with all other standing activities without any UE support.    Personal Factors and Comorbidities Comorbidity 3+    Comorbidities HTN, renal cyst, osteopenia, nystagmus, kidney cysts, iron deficiency anemia, hepatic steatosis, gait abnormality.    Examination-Activity Limitations Locomotion Level;Transfers;Stairs;Toileting    Examination-Participation Restrictions Church;Community Activity;Cleaning    PT Frequency 2x / week    PT Duration 8 weeks    PT Treatment/Interventions ADLs/Self Care Home Management;Aquatic Therapy;DME Instruction;Gait training;Stair training;Functional mobility training;Therapeutic activities;Therapeutic exercise;Balance training;Neuromuscular re-education;Patient/family education;Vestibular;Visual/perceptual remediation/compensation;Manual techniques;Taping    PT Next Visit Plan Progressive TE and balance/neurore-ed exercises as tolerated. Frequent cues for safety. Standing balance/vestibular/multi-sensory balance activities, postural exercises, dynamic balance/ balance recovery.    PT Home Exercise Plan [id number]; plus compensatory saccades from [facility]    Consulted and Agree with Plan of Care Patient;Family member/caregiver    Family Member Consulted husband           Patient will benefit from skilled therapeutic intervention in order to improve the following deficits and impairments:  Abnormal gait,Decreased balance,Decreased coordination,Decreased strength,Difficulty walking,Impaired tone,Impaired UE functional  use,Impaired vision/preception,Postural dysfunction,Decreased mobility,Decreased safety awareness,Decreased endurance  Visit Diagnosis: Muscle weakness (generalized)  Other lack of coordination     Problem List Patient Active Problem List   Diagnosis Date Noted  . PAF (paroxysmal atrial fibrillation) (HCC) 12/30/2020  . Heme + stool 12/27/2020  . Hyponatremia 12/27/2020  . Anemia 12/27/2020  . Protein-calorie malnutrition, severe 12/16/2020  . Encephalopathy 12/10/2020  . Grand mal seizure (HCC) 12/02/2020  . COPD (chronic obstructive pulmonary disease) (HCC) 12/02/2020  . Malnutrition of moderate degree 11/28/2020  . Acute encephalopathy   . Acute respiratory failure with hypoxia (HCC)   . Aspiration into airway   . Cardiac arrest (HCC)   . Groin mass 08/20/2020  . Falls infrequently 03/27/2019  . Sensory neuropathy 02/01/2019  . 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 Cerebellar ataxia (HCC) 08/23/2018  . Sensory polyneuropathy 08/23/2018  . Incontinence of feces   . Visual disturbances 02/06/2017  . Essential hypertension 11/07/2016  . Hyperlipidemia 04/24/2015  . Dysuria 02/11/2015  . Acute cystitis without hematuria 02/11/2015  . Chest pain 02/06/2015  . Left inguinal hernia 08/02/2013  . Gait abnormality 05/14/2013  . Gait disorder   . Nystagmus, end-position   . Routine health maintenance 12/13/2011  . LEG CRAMPS, NOCTURNAL 12/07/2010  . CALLUS, TOE 03/19/2010  . hip pain 03/19/2010  . ATAXIA 11/17/2008  . CERVICAL RADICULOPATHY, RIGHT 10/21/2008  . DIVERTICULOSIS OF COLON 08/14/2008  . RENAL CYST 08/14/2008  . ANEMIA-IRON DEFICIENCY 06/16/2007  . Asthma 06/16/2007  . GERD 06/16/2007  . OSTEOPENIA   06/16/2007    Madelyn Flavors PT 02/24/2021, 12:37 PM  [place]. [place], [place], [address] Phone: [phone]   Fax:  [phone]  Name: Kristine Francis MRN: [id number] Date of Birth:  06/07/1941

## 2021-03-01 ENCOUNTER — Other Ambulatory Visit: Payer: Self-pay

## 2021-03-01 ENCOUNTER — Ambulatory Visit (INDEPENDENT_AMBULATORY_CARE_PROVIDER_SITE_OTHER): Payer: Medicare Other

## 2021-03-01 DIAGNOSIS — G40001 Localization-related (focal) (partial) idiopathic epilepsy and epileptic syndromes with seizures of localized onset, not intractable, with status epilepticus: Secondary | ICD-10-CM | POA: Diagnosis not present

## 2021-03-01 DIAGNOSIS — R490 Dysphonia: Secondary | ICD-10-CM

## 2021-03-01 DIAGNOSIS — R1310 Dysphagia, unspecified: Secondary | ICD-10-CM

## 2021-03-01 DIAGNOSIS — F0390 Unspecified dementia without behavioral disturbance: Secondary | ICD-10-CM

## 2021-03-03 ENCOUNTER — Ambulatory Visit: Payer: Medicare Other | Admitting: Occupational Therapy

## 2021-03-09 ENCOUNTER — Encounter: Payer: Self-pay | Admitting: Physical Therapy

## 2021-03-09 ENCOUNTER — Ambulatory Visit: Payer: Medicare Other | Admitting: Physical Therapy

## 2021-03-09 ENCOUNTER — Other Ambulatory Visit: Payer: Self-pay

## 2021-03-09 ENCOUNTER — Ambulatory Visit: Payer: Medicare Other | Admitting: Occupational Therapy

## 2021-03-09 ENCOUNTER — Encounter: Payer: Self-pay | Admitting: Occupational Therapy

## 2021-03-09 DIAGNOSIS — R278 Other lack of coordination: Secondary | ICD-10-CM | POA: Diagnosis not present

## 2021-03-09 DIAGNOSIS — R41840 Attention and concentration deficit: Secondary | ICD-10-CM

## 2021-03-09 DIAGNOSIS — R2681 Unsteadiness on feet: Secondary | ICD-10-CM | POA: Diagnosis not present

## 2021-03-09 DIAGNOSIS — R260 Ataxic gait: Secondary | ICD-10-CM

## 2021-03-09 DIAGNOSIS — M6281 Muscle weakness (generalized): Secondary | ICD-10-CM | POA: Diagnosis not present

## 2021-03-09 DIAGNOSIS — R270 Ataxia, unspecified: Secondary | ICD-10-CM | POA: Diagnosis not present

## 2021-03-09 NOTE — Therapy (Signed)
[place]. [place], [place], [address] Phone: [phone]   Fax:  [phone]  Physical Therapy Treatment  Patient Details  Name: Kristine Francis MRN: [id number] Date of Birth: Feb 12, 1941 Referring Provider (PT): Cathlyn Parsons, PA-C   Encounter Date: 03/09/2021   PT End of Session - 03/09/21 1620    Visit Number 13    Number of Visits 17    Date for PT Re-Evaluation 04/08/21    PT Start Time 1779    PT Stop Time 1613    PT Time Calculation (min) 40 min    Activity Tolerance Patient tolerated treatment well    Behavior During Therapy [facility] for tasks assessed/performed           Past Medical History:  Diagnosis Date  . Allergy   . Asthma    sees Dr. Tiajuana Amass - as child per pt  . At high risk for falls    unstable gait   . Ataxia   . Cataract    removed bilaterally   . Diverticulosis   . Emphysema   . Gait abnormality 05/14/2013   [name] a patient of Dr. Linda Hedges presented first interpreter thousand 13 with a gait dysfunction, she also had an episodic confusion and Loss device. Exam found a mild nystagmus and she was referred to ophthalmology on 03-2812, brain MRI was normal nystagmus was a secondary diagnosis to vestibulitis, ear nose and throat has followed and had seen the patient. At the time of the nystagmus the patie  . GERD (gastroesophageal reflux disease)   . Hepatic steatosis   . Hyperlipidemia    on atorvastatin   . Hypertension    on losartan   . Iron deficiency anemia, unspecified   . Kidney cysts 11/16/12   small cyst on left  . Nystagmus, end-position   . Osteopenia   . Renal cyst   . Stroke ([facility] [place]) 01/2017   mild     Past Surgical History:  Procedure Laterality Date  . ANAL RECTAL MANOMETRY N/A 12/20/2017   Procedure: ANO RECTAL MANOMETRY;  Surgeon: Mauri Pole, MD;  Location: [facility];  Service: Endoscopy;  Laterality: N/A;  . BREAST BIOPSY Bilateral 1970's  .  CATARACT EXTRACTION Bilateral LT:02/14/13,RT:02/21/13  . CERVICAL SPINE SURGERY  2010  . COLONOSCOPY  03-12-14   per Dr. Olevia Perches, diverticulosis only, repeat 5 yrs   . EYE SURGERY Bilateral 01/2013&02/2013   left then right  . HERNIA REPAIR  Oct '14-left, remote-right   years ago right; left done '14  . SKIN CANCER EXCISION  05/13/13   FACE, basal cell  . TONSILLECTOMY      There were no vitals filed for this visit.   Subjective Assessment - 03/09/21 1543    Subjective Discussed continuation of PT; pt and husband would like to continue for a few more weeks. Plan to d/c after this period of time with VU and agreement.    Currently in Pain? No/denies              [facility] PT Assessment - 03/09/21 0001      Transfers   Five time sit to stand comments  21.22 sec no UE assist mod cuing for sequencing and to avoid LEs hooked on back of chair      Berg Balance Test   Sit to Stand Able to stand  independently using hands    Standing Unsupported Able to stand 2 minutes with supervision  Sitting with Back Unsupported but Feet Supported on Floor or Stool Able to sit safely and securely 2 minutes    Stand to Sit Uses backs of legs against chair to control descent    Transfers Able to transfer with verbal cueing and /or supervision    Standing Unsupported with Eyes Closed Able to stand 10 seconds with supervision    Standing Unsupported with Feet Together Needs help to attain position but able to stand for 30 seconds with feet together    From Standing, Reach Forward with Outstretched Arm Reaches forward but needs supervision    From Standing Position, Pick up Object from [place] to pick up shoe, needs supervision    From Standing Position, Turn to Look Behind Over each Shoulder Needs supervision when turning    Turn 360 Degrees Needs close supervision or verbal cueing    Standing Unsupported, Alternately Place Feet on Step/Stool Needs assistance to keep from falling or unable to try     Standing Unsupported, One Foot in [employer] balance while stepping or standing    Standing on One Leg Tries to lift leg/unable to hold 3 seconds but remains standing independently    Total Score 25                         [facility] Adult PT Treatment/Exercise - 03/09/21 0001      Ambulation/Gait   Gait Comments gait with RW x100 ft x2 with cues for RW maintained close to body      Knee/Hip Exercises: Aerobic   Nustep L5 x 8 min      Knee/Hip Exercises: Seated   Sit to Sand 20 reps;without UE support   close supervision-CGA, occasional posterior LOB with PT correcting                   PT Short Term Goals - 01/13/21 1511      PT SHORT TERM GOAL #1   Title Pt and caregiver/spouse independent with initiating initial HEP    Baseline spouse reports difficult to get pt to maintain attention for HEP; educated on providing visual cuing    Time 2    Period Weeks    Status On-going    Target Date 01/18/21      PT SHORT TERM GOAL #2   Title Pt will improve five time sit to stand from mat, no UE support to </= 15 seconds but with improved anterior weight shift and decreased posterior LOB    Baseline able to perform with no UE support but does demo lack of anterior weight shift and occasional posterior LOB    Time 4    Period Weeks    Status On-going    Target Date 02/01/21      PT SHORT TERM GOAL #3   Title Pt will demo improved static standing balance to at least 1 minute with close supervision and minimal sway, without LOB    Baseline ~20 sec this rx    Time 4    Period Weeks    Status On-going    Target Date 02/01/21      PT SHORT TERM GOAL #4   Title Pt and caregiver will demo good understanding of falls prevention and safe functional mobility within the home to prevent falls    Time 2    Period Weeks    Status On-going    Target Date 01/18/21  PT Long Term Goals - 03/09/21 1601      PT LONG TERM GOAL #1   Title Pt will demonstrate  independence with final HEP    Time 8    Period Weeks    Status On-going      PT LONG TERM GOAL #2   Title Pt will improve five time sit to stand without use of UE to </= 13 seconds and no posterior LOB    Baseline 20.74  sec; truncal ataxia but no LOB. Mod cuing for sequencing with STS to avoid posterior LOB    Time 8    Period Weeks    Status Partially Met      PT LONG TERM GOAL #3   Title Pt will demo BLE strength at least 4+/5    Baseline 5/5 BLE    Time 8    Period Weeks    Status Achieved      PT LONG TERM GOAL #4   Title Pt will improve BERG by at least 10  points to indicate decreased falls risk    Baseline 25/56: no signficant changes in Berg Score since time of eval    Time 8    Period Weeks    Status On-going      PT LONG TERM GOAL #5   Title Pt will ambulate and complete direction changes with least restrictive assistive device safely without LOB to facilitate improved safety with community negotiation    Baseline pt requires supervision-CGA when ambulating with RW    Time 8    Period Weeks    Status On-going                 Plan - 03/09/21 1621    Clinical Impression Statement Pt demos very minimal progress toward goals. No significant improvement in Berg Balance score. Still requires mod cuing for STS, cues to avoid LEs on back of chair, occasional posterior LOB requiring CGA-minA. Discussed progress with pt and husband. They would like to continue for 2-3 more weeks; plan for discharge with updated HEP at this time with VU and agreement.    PT Treatment/Interventions ADLs/Self Care Home Management;Aquatic Therapy;DME Instruction;Gait training;Stair training;Functional mobility training;Therapeutic activities;Therapeutic exercise;Balance training;Neuromuscular re-education;Patient/family education;Vestibular;Visual/perceptual remediation/compensation;Manual techniques;Taping    PT Next Visit Plan Progressive TE and balance/neurore-ed exercises as tolerated.  Frequent cues for safety. Standing balance/vestibular/multi-sensory balance activities, postural exercises, dynamic balance/ balance recovery.    Consulted and Agree with Plan of Care Patient;Family member/caregiver    Family Member Consulted husband           Patient will benefit from skilled therapeutic intervention in order to improve the following deficits and impairments:  Abnormal gait,Decreased balance,Decreased coordination,Decreased strength,Difficulty walking,Impaired tone,Impaired UE functional use,Impaired vision/preception,Postural dysfunction,Decreased mobility,Decreased safety awareness,Decreased endurance  Visit Diagnosis: Muscle weakness (generalized)  Ataxic gait  Unsteadiness on feet     Problem List Patient Active Problem List   Diagnosis Date Noted  . PAF (paroxysmal atrial fibrillation) ([place]) 12/30/2020  . Heme + stool 12/27/2020  . Hyponatremia 12/27/2020  . Anemia 12/27/2020  . Protein-calorie malnutrition, severe 12/16/2020  . Encephalopathy 12/10/2020  . Grand mal seizure ([place]) 12/02/2020  . COPD (chronic obstructive pulmonary disease) ([place]) 12/02/2020  . Malnutrition of moderate degree 11/28/2020  . Acute encephalopathy   . Acute respiratory failure with hypoxia ([place])   . Aspiration into airway   . Cardiac arrest ([place])   . Groin mass 08/20/2020  . Falls infrequently 03/27/2019  . Sensory neuropathy 02/01/2019  .  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 Cerebellar ataxia ([place]) 08/23/2018  . Sensory polyneuropathy 08/23/2018  . Incontinence of feces   . Visual disturbances 02/06/2017  . Essential hypertension 11/07/2016  . Hyperlipidemia 04/24/2015  . Dysuria 02/11/2015  . Acute cystitis without hematuria 02/11/2015  . Chest pain 02/06/2015  . Left inguinal hernia 08/02/2013  . Gait abnormality 05/14/2013  . Gait disorder   . Nystagmus, end-position   . Routine health maintenance 12/13/2011  . LEG CRAMPS, NOCTURNAL 12/07/2010  . CALLUS, TOE  03/19/2010  . hip pain 03/19/2010  . ATAXIA 11/17/2008  . CERVICAL RADICULOPATHY, RIGHT 10/21/2008  . DIVERTICULOSIS OF COLON 08/14/2008  . RENAL CYST 08/14/2008  . ANEMIA-IRON DEFICIENCY 06/16/2007  . Asthma 06/16/2007  . GERD 06/16/2007  . OSTEOPENIA 06/16/2007   Amador Cunas, PT, DPT [facility] Mcaffee 03/09/2021, 4:23 PM  [place]. [place], [place], [address] Phone: [phone]   Fax:  [phone]  Name: Kristine Francis MRN: [id number] Date of Birth: Dec 28, 1940

## 2021-03-09 NOTE — Therapy (Signed)
[place]. [place], [place], [address] Phone: [phone]   Fax:  [phone]  Occupational Therapy Treatment  Patient Details  Name: Kristine Francis MRN: [id number] Date of Birth: 07-13-1941 Referring Provider (OT): Lauraine Rinne, PA-C   Encounter Date: 03/09/2021   OT End of Session - 03/09/21 1630    Visit Number 15    Number of Visits 17    Date for OT Re-Evaluation 03/24/21    Authorization Type Medicare    Progress Note Due on Visit 61    OT Start Time 1615    OT Stop Time 1700    OT Time Calculation (min) 45 min    Activity Tolerance Patient tolerated treatment well    Behavior During Therapy [facility] for tasks assessed/performed   Confusion          Past Medical History:  Diagnosis Date  . Allergy   . Asthma    sees Dr. Tiajuana Amass - as child per pt  . At high risk for falls    unstable gait   . Ataxia   . Cataract    removed bilaterally   . Diverticulosis   . Emphysema   . Gait abnormality 05/14/2013   [name] a patient of Dr. Linda Hedges presented first interpreter thousand 13 with a gait dysfunction, she also had an episodic confusion and Loss device. Exam found a mild nystagmus and she was referred to ophthalmology on 03-2812, brain MRI was normal nystagmus was a secondary diagnosis to vestibulitis, ear nose and throat has followed and had seen the patient. At the time of the nystagmus the patie  . GERD (gastroesophageal reflux disease)   . Hepatic steatosis   . Hyperlipidemia    on atorvastatin   . Hypertension    on losartan   . Iron deficiency anemia, unspecified   . Kidney cysts 11/16/12   small cyst on left  . Nystagmus, end-position   . Osteopenia   . Renal cyst   . Stroke ([place]) 01/2017   mild     Past Surgical History:  Procedure Laterality Date  . ANAL RECTAL MANOMETRY N/A 12/20/2017   Procedure: ANO RECTAL MANOMETRY;  Surgeon: Mauri Pole, MD;  Location: [facility];   Service: Endoscopy;  Laterality: N/A;  . BREAST BIOPSY Bilateral 1970's  . CATARACT EXTRACTION Bilateral LT:02/14/13,RT:02/21/13  . CERVICAL SPINE SURGERY  2010  . COLONOSCOPY  03-12-14   per Dr. Olevia Perches, diverticulosis only, repeat 5 yrs   . EYE SURGERY Bilateral 01/2013&02/2013   left then right  . HERNIA REPAIR  Oct '14-left, remote-right   years ago right; left done '14  . SKIN CANCER EXCISION  05/13/13   FACE, basal cell  . TONSILLECTOMY      There were no vitals filed for this visit.   Subjective Assessment - 03/09/21 1616    Subjective  "Thank you so much for all your help, I really think this is good for me"    Patient is accompanied by: Family member   [name] (husband)   Pertinent History CVA (March 2018), ataxia, COPD, osteopenia, neuropathy, gait abnormality, asthma, iron deficiency anemia, GERD, and hx of nystagmus    Patient Stated Goals Get back to being independent; play tennis again    Currently in Pain? No/denies             [facility] OT Assessment - 03/09/21 1621      Coordination   9 Hole Peg Test Right;Left  Right 9 Hole Peg Test 3 min, 54 sec   3 min, 19 sec on 2nd attempt; multiple drops; often sliding pegs to edge of pegboard to retrieve them   Left 9 Hole Peg Test 1 min, 12 sec   2 drops, self-corrected             OT Treatments/Exercises (OP) - 03/09/21 1636      ADLs   UB Dressing Pt completed tying using large laces w/ Mod I, requiring extended time for success, in 3/3 trials. OT also discussed compensatory strategies for small zippers; pt and spouse were receptive    Deborha Payment Due to inability to manipulate small buttons effectively, OT attempted use of a button hook w/ pt, which significantly improved success; pt able to button 3/5 buttons w/ Min A, requiring Mod A in 2/5 trials   Pt's spouse reports he is fine with manipulating buttons for her AAT            OT Short Term Goals - 02/11/21 1751      OT SHORT TERM GOAL #1   Title Pt will be  able to complete initial HEP with Min A from caregiver    Baseline No current HEP    Time 3    Period Weeks    Status Partially Met   02/03/21 - pt is able to complete initial HEP but spouse reports non-compliance at home   Target Date 01/26/21      OT SHORT TERM GOAL #2   Title Pt will be able to complete asymmetrical bilateral coordination activities independently in at least 3/4 trials to improve functional UE use during ADLs.    Baseline Decreased BUE GM coordination    Time 3    Period Weeks    Status Achieved   02/11/21 - completed asymmetrical bilateral coordination during [facility] activity w/out difficulty in multiple trials            OT Long Term Goals - 03/09/21 1730      OT LONG TERM GOAL #1   Title Pt will complete HEP designed for BUE strength and coordination with Mod I and report carryover to home    Baseline No current HEP    Time 6    Period Weeks    Status Not Met   02/09/21 - spouse reports pt does not complete any of her exercises at home     OT LONG TERM GOAL #2   Title Pt will demonstrate preparing a simple meal safely with Min A    Baseline Currently unable to prepare meals safely    Time 6    Period Weeks    Status On-going      OT LONG TERM GOAL #3   Title Pt will be able to safely complete UB/LB dressing with Mod I at least 75% of the time    Baseline Currently SPV/SBA for dressing    Time 6    Period Weeks    Status Achieved   02/09/21 - pt and her spouse report she is able to get her clothes out and get herself dressed w/ Mod I     OT LONG TERM GOAL #4   Title Pt will be able to manipulate clothing fasteners without assistance in at least 2/3 trials    Baseline Decreased [place] as evidenced by time on 9-Hole Peg Test    Time 6    Period Weeks    Status Achieved   03/09/21 - able to  clothing string w/ Mod I in 3/3 trials     OT LONG TERM GOAL #5   Title Pt's spouse will verbalize understanding of at least 2 compensatory/safety strategies to improve  pt's safety with ADLs and aid in fall prevention    Baseline Decreased safety awareness and recall; no current compensatory strategies for ADLs    Time 6    Period Weeks    Status Revised   02/09/21 - pt unable to recall any energy conservation/safety strategies            Plan - 03/09/21 1721    Clinical Impression Statement Pt completed 9-HPT re-assessment with OT providing interpretation of results to pt and her husband; pt's has improved time w/ L hand by 46 sec and w/ R hand by 40 sec (completed 2 attempts for R hand - 1st attempt was same time as initial measurement with 2nd attempt demo'ing improvement). Pt then participated in clothing manipulative activities, experiencing difficulty w/ small buttons and zippers, but pt was able to complete tying w/ extended time. Ataxia, primarily of R hand, continues to be limiting for functional FM tasks.    OT Occupational Profile and History Detailed Assessment- Review of Records and additional review of physical, cognitive, psychosocial history related to current functional performance    Occupational performance deficits (Please refer to evaluation for details): ADL's;IADL's;Leisure    Body Structure / Function / Physical Skills ADL;Decreased knowledge of precautions;UE functional use;FMC;Dexterity;Body mechanics;Decreased knowledge of use of DME;Balance;Vision;GMC;Strength;IADL;Coordination    Cognitive Skills Safety Awareness    Rehab Potential Good    Clinical Decision Making Several treatment options, min-mod task modification necessary    Comorbidities Affecting Occupational Performance: Presence of comorbidities impacting occupational performance    Modification or Assistance to Complete Evaluation  Min-Moderate modification of tasks or assist with assess necessary to complete eval    OT Frequency 1x / week    OT Duration 4 weeks    OT Treatment/Interventions Self-care/ADL training;Therapeutic exercise;Visual/perceptual  remediation/compensation;Aquatic Therapy;Neuromuscular education;Patient/family education;Energy conservation;Therapeutic activities;DME and/or AE instruction;Manual Therapy;Cognitive remediation/compensation    Plan Meal prep; tennis substitute    Consulted and Agree with Plan of Care Patient;Family member/caregiver    Family Member Consulted Husband [name])           Patient will benefit from skilled therapeutic intervention in order to improve the following deficits and impairments:   Body Structure / Function / Physical Skills: ADL,Decreased knowledge of precautions,UE functional use,FMC,Dexterity,Body mechanics,Decreased knowledge of use of DME,Balance,Vision,GMC,Strength,IADL,Coordination Cognitive Skills: Safety Awareness     Visit Diagnosis: Ataxia  Muscle weakness (generalized)  Other lack of coordination  Attention and concentration deficit    Problem List Patient Active Problem List   Diagnosis Date Noted  . PAF (paroxysmal atrial fibrillation) ([place]) 12/30/2020  . Heme + stool 12/27/2020  . Hyponatremia 12/27/2020  . Anemia 12/27/2020  . Protein-calorie malnutrition, severe 12/16/2020  . Encephalopathy 12/10/2020  . Grand mal seizure ([place]) 12/02/2020  . COPD (chronic obstructive pulmonary disease) ([place]) 12/02/2020  . Malnutrition of moderate degree 11/28/2020  . Acute encephalopathy   . Acute respiratory failure with hypoxia ([place])   . Aspiration into airway   . Cardiac arrest ([place])   . Groin mass 08/20/2020  . Falls infrequently 03/27/2019  . Sensory neuropathy 02/01/2019  . 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 Cerebellar ataxia ([place]) 08/23/2018  . Sensory polyneuropathy 08/23/2018  . Incontinence of feces   . Visual disturbances 02/06/2017  . Essential hypertension 11/07/2016  . Hyperlipidemia 04/24/2015  .  Dysuria 02/11/2015  . Acute cystitis without hematuria 02/11/2015  . Chest pain 02/06/2015  . Left inguinal hernia 08/02/2013  . Gait abnormality  05/14/2013  . Gait disorder   . Nystagmus, end-position   . Routine health maintenance 12/13/2011  . LEG CRAMPS, NOCTURNAL 12/07/2010  . CALLUS, TOE 03/19/2010  . hip pain 03/19/2010  . ATAXIA 11/17/2008  . CERVICAL RADICULOPATHY, RIGHT 10/21/2008  . DIVERTICULOSIS OF COLON 08/14/2008  . RENAL CYST 08/14/2008  . ANEMIA-IRON DEFICIENCY 06/16/2007  . Asthma 06/16/2007  . GERD 06/16/2007  . OSTEOPENIA 06/16/2007     Kathrine Cords, OTR/L, MSOT 03/09/2021, 5:39 PM  [place] [place], [place], [address] Phone: [phone]   Fax:  [phone]  Name: Kristine [place] MRN: [id number] Date of Birth: Sep 12, 1941

## 2021-03-12 ENCOUNTER — Encounter: Payer: Medicare Other | Attending: Registered Nurse | Admitting: Physical Medicine & Rehabilitation

## 2021-03-12 DIAGNOSIS — I63139 Cerebral infarction due to embolism of unspecified carotid artery: Secondary | ICD-10-CM | POA: Insufficient documentation

## 2021-03-12 DIAGNOSIS — R269 Unspecified abnormalities of gait and mobility: Secondary | ICD-10-CM | POA: Insufficient documentation

## 2021-03-12 DIAGNOSIS — I69398 Other sequelae of cerebral infarction: Secondary | ICD-10-CM | POA: Insufficient documentation

## 2021-03-12 DIAGNOSIS — I69319 Unspecified symptoms and signs involving cognitive functions following cerebral infarction: Secondary | ICD-10-CM | POA: Insufficient documentation

## 2021-03-14 DIAGNOSIS — F0390 Unspecified dementia without behavioral disturbance: Secondary | ICD-10-CM | POA: Insufficient documentation

## 2021-03-14 DIAGNOSIS — G40001 Localization-related (focal) (partial) idiopathic epilepsy and epileptic syndromes with seizures of localized onset, not intractable, with status epilepticus: Secondary | ICD-10-CM | POA: Insufficient documentation

## 2021-03-14 DIAGNOSIS — R1310 Dysphagia, unspecified: Secondary | ICD-10-CM | POA: Insufficient documentation

## 2021-03-14 DIAGNOSIS — R490 Dysphonia: Secondary | ICD-10-CM | POA: Insufficient documentation

## 2021-03-14 NOTE — Procedures (Signed)
This patient is a 80 year old woman with a past medical history of emergency room admission as code stroke and a witnessed new onset seizure by ED physician.  The patient is completing physical therapy occupational therapy and speech therapy and was started on Keppra in the hospital.  During her visit at [facility] the patient appeared dysphonic, dysarthric with a new onset nystagmus.  This EEG study was performed by following the international 10-20 system of electrode placement.  A posterior dominant rhythm was noted to be slow at 7 Hz within the left occipital hemisphere.  At the same time the right occipital hemisphere showed an 8 Hz posterior rhythm. Within the left hemisphere there was also a larger amplitude noted over TV 7 P7.  The patient proceeded to hyperventilation which did lead to amplitude buildup but the left hemisphere remained slower.  After 2 minutes photic stimulation followed, noticeable posterior entrainment resulted.  After a brief resting period, I noted an isolated occurrence of phase reversal over T7. Please note also that the EKG electrode showed a bradycardia rate in the 40s.  Conclusion this is an abnormal EEG with asymmetry- indicating the left brain to be slower and a possible focus of seizure activity. Larey Seat, MD

## 2021-03-14 NOTE — Progress Notes (Signed)
Conclusion this is an abnormal EEG with asymmetry- indicating the left brain to be slower and a possible focus of seizure activity. The patient had undergone serial EEGs in hospital, and she is established at [facility].   Larey Seat, MD

## 2021-03-16 ENCOUNTER — Ambulatory Visit: Payer: Medicare Other | Admitting: Occupational Therapy

## 2021-03-17 ENCOUNTER — Encounter: Payer: Self-pay | Admitting: Occupational Therapy

## 2021-03-17 ENCOUNTER — Other Ambulatory Visit: Payer: Self-pay

## 2021-03-17 ENCOUNTER — Ambulatory Visit: Payer: Medicare Other | Admitting: Occupational Therapy

## 2021-03-17 DIAGNOSIS — R41840 Attention and concentration deficit: Secondary | ICD-10-CM

## 2021-03-17 DIAGNOSIS — R278 Other lack of coordination: Secondary | ICD-10-CM | POA: Diagnosis not present

## 2021-03-17 DIAGNOSIS — R270 Ataxia, unspecified: Secondary | ICD-10-CM

## 2021-03-17 DIAGNOSIS — M6281 Muscle weakness (generalized): Secondary | ICD-10-CM

## 2021-03-17 DIAGNOSIS — R260 Ataxic gait: Secondary | ICD-10-CM | POA: Diagnosis not present

## 2021-03-17 DIAGNOSIS — R2681 Unsteadiness on feet: Secondary | ICD-10-CM | POA: Diagnosis not present

## 2021-03-17 NOTE — Therapy (Signed)
[place] [place], [place], [address] Phone: [phone]   Fax:  [phone]  Occupational Therapy Treatment  Patient Details  Name: Kristine Francis MRN: [id number] Date of Birth: 03-17-1941 Referring Provider (OT): Lauraine Rinne, PA-C   Encounter Date: 03/17/2021   OT End of Session - 03/17/21 1423    Visit Number 16    Number of Visits 17    Date for OT Re-Evaluation 03/24/21    Authorization Type Medicare    Progress Note Due on Visit 62    OT Start Time 1402    OT Stop Time 1444    OT Time Calculation (min) 42 min    Activity Tolerance Patient tolerated treatment well    Behavior During Therapy [facility] for tasks assessed/performed   Confusion          Past Medical History:  Diagnosis Date  . Allergy   . Asthma    sees Dr. Tiajuana Amass - as child per pt  . At high risk for falls    unstable gait   . Ataxia   . Cataract    removed bilaterally   . Diverticulosis   . Emphysema   . Gait abnormality 05/14/2013   Kristine Francis a patient of Dr. Linda Hedges presented first interpreter thousand 13 with a gait dysfunction, she also had an episodic confusion and Loss device. Exam found a mild nystagmus and she was referred to ophthalmology on 03-2812, brain MRI was normal nystagmus was a secondary diagnosis to vestibulitis, ear nose and throat has followed and had seen the patient. At the time of the nystagmus the patie  . GERD (gastroesophageal reflux disease)   . Hepatic steatosis   . Hyperlipidemia    on atorvastatin   . Hypertension    on losartan   . Iron deficiency anemia, unspecified   . Kidney cysts 11/16/12   small cyst on left  . Nystagmus, end-position   . Osteopenia   . Renal cyst   . Stroke ([place]) 01/2017   mild     Past Surgical History:  Procedure Laterality Date  . ANAL RECTAL MANOMETRY N/A 12/20/2017   Procedure: ANO RECTAL MANOMETRY;  Surgeon: Mauri Pole, MD;  Location: [facility];   Service: Endoscopy;  Laterality: N/A;  . BREAST BIOPSY Bilateral 1970's  . CATARACT EXTRACTION Bilateral LT:02/14/13,RT:02/21/13  . CERVICAL SPINE SURGERY  2010  . COLONOSCOPY  03-12-14   per Dr. Olevia Perches, diverticulosis only, repeat 5 yrs   . EYE SURGERY Bilateral 01/2013&02/2013   left then right  . HERNIA REPAIR  Oct '14-left, remote-right   years ago right; left done '14  . SKIN CANCER EXCISION  05/13/13   FACE, basal cell  . TONSILLECTOMY      There were no vitals filed for this visit.   Subjective Assessment - 03/17/21 1411    Subjective  "My grandson is here today"    Patient is accompanied by: Family member   [name] (husband)   Pertinent History CVA (March 2018), ataxia, COPD, osteopenia, neuropathy, gait abnormality, asthma, iron deficiency anemia, GERD, and hx of nystagmus    Patient Stated Goals Get back to being independent; play tennis again    Currently in Pain? No/denies             OT Treatments/Exercises (OP) - 03/17/21 1416      ADLs   Cooking Simulated simple meal prep involving pt identifying different equipment/tasks involved in activity based on provided recipe  Shoulder Exercises: Seated   Other Seated Exercises BUE forward circles w/ PVC pipe square to facilitate bilateral coordination and AROM; completed 20 reps      Shoulder Exercises: ROM/Strengthening   Nustep 6 minutes to improve generalized endurance and BUE/BLE strengthening      Hand Exercises   Other Hand Exercises Hand exerciser used to pick up 1" blocks and place them into bowl in front of pt to facilitate bilateral hand strength and functional forward reach; completed 25x w/ each UE             OT Short Term Goals - 02/11/21 1751      OT SHORT TERM GOAL #1   Title Pt will be able to complete initial HEP with Min A from caregiver    Baseline No current HEP    Time 3    Period Weeks    Status Partially Met   02/03/21 - pt is able to complete initial HEP but spouse reports  non-compliance at home   Target Date 01/26/21      OT SHORT TERM GOAL #2   Title Pt will be able to complete asymmetrical bilateral coordination activities independently in at least 3/4 trials to improve functional UE use during ADLs.    Baseline Decreased BUE GM coordination    Time 3    Period Weeks    Status Achieved   02/11/21 - completed asymmetrical bilateral coordination during [facility] activity w/out difficulty in multiple trials            OT Long Term Goals - 03/09/21 1730      OT LONG TERM GOAL #1   Title Pt will complete HEP designed for BUE strength and coordination with Mod I and report carryover to home    Baseline No current HEP    Time 6    Period Weeks    Status Not Met   02/09/21 - spouse reports pt does not complete any of her exercises at home     OT LONG TERM GOAL #2   Title Pt will demonstrate preparing a simple meal safely with Min A    Baseline Currently unable to prepare meals safely    Time 6    Period Weeks    Status On-going      OT LONG TERM GOAL #3   Title Pt will be able to safely complete UB/LB dressing with Mod I at least 75% of the time    Baseline Currently SPV/SBA for dressing    Time 6    Period Weeks    Status Achieved   02/09/21 - pt and her spouse report she is able to get her clothes out and get herself dressed w/ Mod I     OT LONG TERM GOAL #4   Title Pt will be able to manipulate clothing fasteners without assistance in at least 2/3 trials    Baseline Decreased [place] as evidenced by time on 9-Hole Peg Test    Time 6    Period Weeks    Status Achieved   03/09/21 - able to clothing string w/ Mod I in 3/3 trials     OT LONG TERM GOAL #5   Title Pt's spouse will verbalize understanding of at least 2 compensatory/safety strategies to improve pt's safety with ADLs and aid in fall prevention    Baseline Decreased safety awareness and recall; no current compensatory strategies for ADLs    Time 6    Period Weeks    Status  Revised   02/09/21 -  pt unable to recall any energy conservation/safety strategies            Plan - 03/17/21 1542    Clinical Impression Statement Therapeutic exercises completed this session to facilitate improved generalized endurance and BUE/BLE strengthening w/ pt demo'ing improvement in hand strength, completing activity using hand exerciser with minimal drops. OT also facilitated simulated meal prep activity to improve sequencing, organization of throught, and safety during IADLs. Pt was able to identify all needed ingredients and utensils, determine time required to complete activity, and calculate approximately how many servings the recipe would yield with minimal verbal cueing and breakdown of task. OT also observed mild edema of BLE, which could be typical due to pt usually wearing long pants to therapy, but OT did encourage pt and her spouse to follow-up with a physician if they noticed any atypical symptoms; pt and spouse were receptive.    OT Occupational Profile and History Detailed Assessment- Review of Records and additional review of physical, cognitive, psychosocial history related to current functional performance    Occupational performance deficits (Please refer to evaluation for details): ADL's;IADL's;Leisure    Body Structure / Function / Physical Skills ADL;Decreased knowledge of precautions;UE functional use;FMC;Dexterity;Body mechanics;Decreased knowledge of use of DME;Balance;Vision;GMC;Strength;IADL;Coordination    Cognitive Skills Safety Awareness    Rehab Potential Good    Clinical Decision Making Several treatment options, min-mod task modification necessary    Comorbidities Affecting Occupational Performance: Presence of comorbidities impacting occupational performance    Modification or Assistance to Complete Evaluation  Min-Moderate modification of tasks or assist with assess necessary to complete eval    OT Frequency 1x / week    OT Duration 4 weeks    OT Treatment/Interventions  Self-care/ADL training;Therapeutic exercise;Visual/perceptual remediation/compensation;Aquatic Therapy;Neuromuscular education;Patient/family education;Energy conservation;Therapeutic activities;DME and/or AE instruction;Manual Therapy;Cognitive remediation/compensation    Plan Meal prep; update HEP in preparation for d/c    Consulted and Agree with Plan of Care Patient;Family member/caregiver    Family Member Consulted Husband [name])           Patient will benefit from skilled therapeutic intervention in order to improve the following deficits and impairments:   Body Structure / Function / Physical Skills: ADL,Decreased knowledge of precautions,UE functional use,FMC,Dexterity,Body mechanics,Decreased knowledge of use of DME,Balance,Vision,GMC,Strength,IADL,Coordination Cognitive Skills: Safety Awareness     Visit Diagnosis: Muscle weakness (generalized)  Ataxia  Other lack of coordination  Attention and concentration deficit    Problem List Patient Active Problem List   Diagnosis Date Noted  . Localization-related idiopathic epilepsy and epileptic syndromes with seizures of localized onset, not intractable, with status epilepticus ([place]) 03/14/2021  . Dysphagia 03/14/2021  . Dysphonia 03/14/2021  . Dementia arising in the senium and presenium ([place]) 03/14/2021  . PAF (paroxysmal atrial fibrillation) ([place]) 12/30/2020  . Heme + stool 12/27/2020  . Hyponatremia 12/27/2020  . Anemia 12/27/2020  . Protein-calorie malnutrition, severe 12/16/2020  . Encephalopathy 12/10/2020  . Grand mal seizure ([place]) 12/02/2020  . COPD (chronic obstructive pulmonary disease) ([place]) 12/02/2020  . Malnutrition of moderate degree 11/28/2020  . Acute encephalopathy   . Acute respiratory failure with hypoxia ([place])   . Aspiration into airway   . Cardiac arrest ([place])   . Groin mass 08/20/2020  . Falls infrequently 03/27/2019  . Sensory neuropathy 02/01/2019  . 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  Cerebellar ataxia ([place]) 08/23/2018  . Sensory polyneuropathy 08/23/2018  . Incontinence of feces   . Visual disturbances 02/06/2017  . Essential hypertension  11/07/2016  . Hyperlipidemia 04/24/2015  . Dysuria 02/11/2015  . Acute cystitis without hematuria 02/11/2015  . Chest pain 02/06/2015  . Left inguinal hernia 08/02/2013  . Gait abnormality 05/14/2013  . Gait disorder   . Nystagmus, end-position   . Routine health maintenance 12/13/2011  . LEG CRAMPS, NOCTURNAL 12/07/2010  . CALLUS, TOE 03/19/2010  . hip pain 03/19/2010  . ATAXIA 11/17/2008  . CERVICAL RADICULOPATHY, RIGHT 10/21/2008  . DIVERTICULOSIS OF COLON 08/14/2008  . RENAL CYST 08/14/2008  . ANEMIA-IRON DEFICIENCY 06/16/2007  . Asthma 06/16/2007  . GERD 06/16/2007  . OSTEOPENIA 06/16/2007     Kathrine Cords, OTR/L, MSOT 03/17/2021, 2:00 PM  [place]. [place], [place], [address] Phone: [phone]   Fax:  [phone]  Name: BRYANN GENTZ MRN: [id number] Date of Birth: 05-09-1941

## 2021-03-18 ENCOUNTER — Ambulatory Visit (INDEPENDENT_AMBULATORY_CARE_PROVIDER_SITE_OTHER): Payer: Medicare Other

## 2021-03-18 ENCOUNTER — Telehealth: Payer: Self-pay | Admitting: Neurology

## 2021-03-18 DIAGNOSIS — Z0000 Encounter for general adult medical examination without abnormal findings: Secondary | ICD-10-CM

## 2021-03-18 DIAGNOSIS — Z780 Asymptomatic menopausal state: Secondary | ICD-10-CM | POA: Diagnosis not present

## 2021-03-18 DIAGNOSIS — Z1231 Encounter for screening mammogram for malignant neoplasm of breast: Secondary | ICD-10-CM | POA: Diagnosis not present

## 2021-03-18 DIAGNOSIS — Z9114 Patient's other noncompliance with medication regimen: Secondary | ICD-10-CM | POA: Diagnosis not present

## 2021-03-18 NOTE — Patient Instructions (Addendum)
Kristine Francis , Thank you for taking time to come for your Medicare Wellness Visit. I appreciate your ongoing commitment to your health goals. Please review the following plan we discussed and let me know if I can assist you in the future.   Screening recommendations/referrals: Colonoscopy: Current due 04/25/2024 Mammogram: referral placed 03/17/2021 Bone Density: referral placed 03/17/2021 Recommended yearly ophthalmology/optometry visit for glaucoma screening and checkup Recommended yearly dental visit for hygiene and checkup  Vaccinations: Influenza vaccine: current due fall 2022 Pneumococcal vaccine: completed series Tdap vaccine: current due 02/032024 Shingles vaccine: declined  Advanced directives: will proved copies   Conditions/risks identified: medication compliance pharmacy referral to discuss   Next appointment: declined to schedule a visit    Preventive Care 79 Years and Older, Female Preventive care refers to lifestyle choices and visits with your health care provider that can promote health and wellness. What does preventive care include?  A yearly physical exam. This is also called an annual well check.  Dental exams once or twice a year.  Routine eye exams. Ask your health care provider how often you should have your eyes checked.  Personal lifestyle choices, including:  Daily care of your teeth and gums.  Regular physical activity.  Eating a healthy diet.  Avoiding tobacco and drug use.  Limiting alcohol use.  Practicing safe sex.  Taking low-dose aspirin every day.  Taking vitamin and mineral supplements as recommended by your health care provider. What happens during an annual well check? The services and screenings done by your health care provider during your annual well check will depend on your age, overall health, lifestyle risk factors, and family history of disease. Counseling  Your health care provider may ask you questions about  your:  Alcohol use.  Tobacco use.  Drug use.  Emotional well-being.  Home and relationship well-being.  Sexual activity.  Eating habits.  History of falls.  Memory and ability to understand (cognition).  Work and work Statistician.  Reproductive health. Screening  You may have the following tests or measurements:  Height, weight, and BMI.  Blood pressure.  Lipid and cholesterol levels. These may be checked every 5 years, or more frequently if you are over 79 years old.  Skin check.  Lung cancer screening. You may have this screening every year starting at age 79 if you have a 30-pack-year history of smoking and currently smoke or have quit within the past 15 years.  Fecal occult blood test (FOBT) of the stool. You may have this test every year starting at age 79.  Flexible sigmoidoscopy or colonoscopy. You may have a sigmoidoscopy every 5 years or a colonoscopy every 10 years starting at age 79.  Hepatitis C blood test.  Hepatitis B blood test.  Sexually transmitted disease (STD) testing.  Diabetes screening. This is done by checking your blood sugar (glucose) after you have not eaten for a while (fasting). You may have this done every 1-3 years.  Bone density scan. This is done to screen for osteoporosis. You may have this done starting at age 79.  Mammogram. This may be done every 1-2 years. Talk to your health care provider about how often you should have regular mammograms. Talk with your health care provider about your test results, treatment options, and if necessary, the need for more tests. Vaccines  Your health care provider may recommend certain vaccines, such as:  Influenza vaccine. This is recommended every year.  Tetanus, diphtheria, and acellular pertussis (Tdap, Td) vaccine. You may  need a Td booster every 10 years.  Zoster vaccine. You may need this after age 79.  Pneumococcal 13-valent conjugate (PCV13) vaccine. One dose is recommended  after age 79.  Pneumococcal polysaccharide (PPSV23) vaccine. One dose is recommended after age 79. Talk to your health care provider about which screenings and vaccines you need and how often you need them. This information is not intended to replace advice given to you by your health care provider. Make sure you discuss any questions you have with your health care provider. Document Released: 12/04/2015 Document Revised: 07/27/2016 Document Reviewed: 09/08/2015 Elsevier Interactive Patient Education  2017 [employer] Prevention in the Home Falls can cause injuries. They can happen to people of all ages. There are many things you can do to make your home safe and to help prevent falls. What can I do on the outside of my home?  Regularly fix the edges of walkways and driveways and fix any cracks.  Remove anything that might make you trip as you walk through a door, such as a raised step or threshold.  Trim any bushes or trees on the path to your home.  Use bright outdoor lighting.  Clear any walking paths of anything that might make someone trip, such as rocks or tools.  Regularly check to see if handrails are loose or broken. Make sure that both sides of any steps have handrails.  Any raised decks and porches should have guardrails on the edges.  Have any leaves, snow, or ice cleared regularly.  Use sand or salt on walking paths during winter.  Clean up any spills in your garage right away. This includes oil or grease spills. What can I do in the bathroom?  Use night lights.  Install grab bars by the toilet and in the tub and shower. Do not use towel bars as grab bars.  Use non-skid mats or decals in the tub or shower.  If you need to sit down in the shower, use a plastic, non-slip stool.  Keep the floor dry. Clean up any water that spills on the floor as soon as it happens.  Remove soap buildup in the tub or shower regularly.  Attach bath mats securely with  double-sided non-slip rug tape.  Do not have throw rugs and other things on the floor that can make you trip. What can I do in the bedroom?  Use night lights.  Make sure that you have a light by your bed that is easy to reach.  Do not use any sheets or blankets that are too big for your bed. They should not hang down onto the floor.  Have a firm chair that has side arms. You can use this for support while you get dressed.  Do not have throw rugs and other things on the floor that can make you trip. What can I do in the kitchen?  Clean up any spills right away.  Avoid walking on wet floors.  Keep items that you use a lot in easy-to-reach places.  If you need to reach something above you, use a strong step stool that has a grab bar.  Keep electrical cords out of the way.  Do not use floor polish or wax that makes floors slippery. If you must use wax, use non-skid floor wax.  Do not have throw rugs and other things on the floor that can make you trip. What can I do with my stairs?  Do not leave any items on  the stairs.  Make sure that there are handrails on both sides of the stairs and use them. Fix handrails that are broken or loose. Make sure that handrails are as long as the stairways.  Check any carpeting to make sure that it is firmly attached to the stairs. Fix any carpet that is loose or worn.  Avoid having throw rugs at the top or bottom of the stairs. If you do have throw rugs, attach them to the floor with carpet tape.  Make sure that you have a light switch at the top of the stairs and the bottom of the stairs. If you do not have them, ask someone to add them for you. What else can I do to help prevent falls?  Wear shoes that:  Do not have high heels.  Have rubber bottoms.  Are comfortable and fit you well.  Are closed at the toe. Do not wear sandals.  If you use a stepladder:  Make sure that it is fully opened. Do not climb a closed stepladder.  Make  sure that both sides of the stepladder are locked into place.  Ask someone to hold it for you, if possible.  Clearly mark and make sure that you can see:  Any grab bars or handrails.  First and last steps.  Where the edge of each step is.  Use tools that help you move around (mobility aids) if they are needed. These include:  Canes.  Walkers.  Scooters.  Crutches.  Turn on the lights when you go into a dark area. Replace any light bulbs as soon as they burn out.  Set up your furniture so you have a clear path. Avoid moving your furniture around.  If any of your floors are uneven, fix them.  If there are any pets around you, be aware of where they are.  Review your medicines with your doctor. Some medicines can make you feel dizzy. This can increase your chance of falling. Ask your doctor what other things that you can do to help prevent falls. This information is not intended to replace advice given to you by your health care provider. Make sure you discuss any questions you have with your health care provider. Document Released: 09/03/2009 Document Revised: 04/14/2016 Document Reviewed: 12/12/2014 Elsevier Interactive Patient Education  2017 [employer].

## 2021-03-18 NOTE — Progress Notes (Signed)
Subjective:   Kristine Francis is a 80 y.o. female who presents for an Initial Medicare Annual Wellness Visit.   I connected with Kristine Francis  today by telephone and verified that I am speaking with the correct person using two identifiers. Location patient: home Location provider: work Persons participating in the virtual visit: patient, provider.   I discussed the limitations, risks, security and privacy concerns of performing an evaluation and management service by telephone and the availability of in person appointments. I also discussed with the patient that there may be a patient responsible charge related to this service. The patient expressed understanding and verbally consented to this telephonic visit.    Interactive audio and video telecommunications were attempted between this provider and patient, however failed, due to patient having technical difficulties OR patient did not have access to video capability.  We continued and completed visit with audio only.    Review of Systems    n/a       Objective:    There were no vitals filed for this visit. There is no height or weight on file to calculate BMI.  Advanced Directives 12/10/2020 12/26/2018 09/18/2018 01/02/2018 08/25/2017 01/26/2017 09/08/2015  Does Patient Have a Medical Advance Directive? No Yes Yes Yes Yes No Yes  Type of Advance Directive - [place];Living will - Living will - - Living will  Copy of [place] in Chart? - - - - - - No - copy requested  Would patient like information on creating a medical advance directive? No - Patient declined - - - - No - Patient declined -    Current Medications (verified) Outpatient Encounter Medications as of 03/18/2021  Medication Sig  . acetaminophen (TYLENOL) 325 MG tablet Take 1-2 tablets (325-650 mg total) by mouth every 4 (four) hours as needed for mild pain.  Marland Kitchen amLODipine (NORVASC) 2.5 MG tablet TAKE 1 TABLET BY MOUTH EVERY DAY   . apixaban (ELIQUIS) 5 MG TABS tablet Take 1 tablet (5 mg total) by mouth 2 (two) times daily.  Marland Kitchen atorvastatin (LIPITOR) 10 MG tablet TAKE 1 TABLET BY MOUTH EVERY DAY  . EPINEPHrine 0.3 mg/0.3 mL IJ SOAJ injection Inject 0.3 mg into the muscle as needed for anaphylaxis.  . furosemide (LASIX) 20 MG tablet (Prior Auth: Rx [id number]) (Patient not taking: Reported on 02/05/2021)  . ibuprofen (ADVIL) 800 MG tablet  (Patient not taking: Reported on 02/05/2021)  . levETIRAcetam (KEPPRA) 500 MG tablet Take 1 tablet (500 mg total) by mouth 2 (two) times daily.  Marland Kitchen losartan (COZAAR) 50 MG tablet (Prior Auth: Rx [id number]) (Patient not taking: Reported on 02/05/2021)  . melatonin 3 MG TABS tablet Take 1 tablet (3 mg total) by mouth at bedtime.  . metoprolol tartrate (LOPRESSOR) 25 MG tablet TAKE 1 TABLET BY MOUTH TWICE A DAY  . Multiple Vitamin (MULTIVITAMIN WITH MINERALS) TABS tablet Take 1 tablet by mouth daily. (Patient not taking: Reported on 02/05/2021)  . pantoprazole (PROTONIX) 20 MG tablet TAKE 1 TABLET BY MOUTH EVERY DAY  . STARCH-MALTO DEXTRIN (THICK-IT) PACK Take 1 each by mouth 4 (four) times daily as needed. Thicken liquids to honey consistency (Patient not taking: Reported on 02/05/2021)   No facility-administered encounter medications on file as of 03/18/2021.    Allergies (verified) Contrast media [iodinated diagnostic agents], Iohexol, and Lisinopril   History: Past Medical History:  Diagnosis Date  . Allergy   . Asthma    sees Dr. Tiajuana Amass - as  child per pt  . At high risk for falls    unstable gait   . Ataxia   . Cataract    removed bilaterally   . Diverticulosis   . Emphysema   . Gait abnormality 05/14/2013   [name] a patient of Dr. Linda Hedges presented first interpreter thousand 13 with a gait dysfunction, she also had an episodic confusion and Loss device. Exam found a mild nystagmus and she was referred to ophthalmology on 03-2812, brain MRI was normal nystagmus  was a secondary diagnosis to vestibulitis, ear nose and throat has followed and had seen the patient. At the time of the nystagmus the patie  . GERD (gastroesophageal reflux disease)   . Hepatic steatosis   . Hyperlipidemia    on atorvastatin   . Hypertension    on losartan   . Iron deficiency anemia, unspecified   . Kidney cysts 11/16/12   small cyst on left  . Nystagmus, end-position   . Osteopenia   . Renal cyst   . Stroke ([place]) 01/2017   mild    Past Surgical History:  Procedure Laterality Date  . ANAL RECTAL MANOMETRY N/A 12/20/2017   Procedure: ANO RECTAL MANOMETRY;  Surgeon: Mauri Pole, MD;  Location: [facility];  Service: Endoscopy;  Laterality: N/A;  . BREAST BIOPSY Bilateral 1970's  . CATARACT EXTRACTION Bilateral LT:02/14/13,RT:02/21/13  . CERVICAL SPINE SURGERY  2010  . COLONOSCOPY  03-12-14   per Dr. Olevia Perches, diverticulosis only, repeat 5 yrs   . EYE SURGERY Bilateral 01/2013&02/2013   left then right  . HERNIA REPAIR  Oct '14-left, remote-right   years ago right; left done '14  . SKIN CANCER EXCISION  05/13/13   FACE, basal cell  . TONSILLECTOMY     Family History  Problem Relation Age of Onset  . Colon cancer Father   . Coronary artery disease Father   . Heart block Father   . Pancreatic cancer Mother   . Throat cancer Mother   . Colon cancer Paternal Grandmother   . Colon cancer Paternal Aunt        x 2  . Cancer - Lung Paternal Aunt   . Breast cancer Paternal Aunt   . Colon cancer Paternal Aunt   . Ovarian cancer Maternal Aunt   . Heart attack Paternal Grandfather   . Esophageal cancer Other   . Colon polyps Neg Hx   . Rectal cancer Neg Hx   . Stomach cancer Neg Hx    Social History   Socioeconomic History  . Marital status: Married    Spouse name: Not on file  . Number of children: 3  . Years of education: 32  . Highest education level: Not on file  Occupational History  . 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 Smoking status: Never Smoker  . Smokeless tobacco: Never Used  Vaping Use  . Vaping Use: Never used  Substance and Sexual Activity  . Alcohol use: No    Alcohol/week: 0.0 standard drinks  . Drug use: No  . Sexual activity: Yes    Partners: Male  Other Topics Concern  . 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 married - 1965. 3 sons - '66, '69, '70. Sons in good health. Marriage in good health. Enjoys retirement - remains active.         Social  Determinants of Health   Financial Resource Strain: Not on file  Food Insecurity: Not on file  Transportation Needs: Not on file  Physical Activity: Not on file  Stress: Not on file  Social Connections: Not on file    Tobacco Counseling Counseling given: Not Answered   Clinical Intake:                 Diabetic?no         Activities of Daily Living In your present state of health, do you have any difficulty performing the following activities: 12/10/2020  Hearing? N  Vision? Y  Difficulty concentrating or making decisions? Y  Walking or climbing stairs? Y  Dressing or bathing? Y  Doing errands, shopping? N  Some recent data might be hidden    Patient Care Team: [name], MD as PCP - General (Family Medicine) Dohmeier, Asencion Partridge, MD (Neurology) Jerrell Belfast, MD (Otolaryngology) Luberta Mutter, MD (Ophthalmology) Tiajuana Amass, MD (Allergy and Immunology) Barbaraann Rondo, MD (Obstetrics and Gynecology) Lafayette Dragon, MD (Inactive) (Gastroenterology)  Indicate any recent Medical Services you may have received from other than [facility] providers in the past year (date may be approximate).     Assessment:   This is a routine wellness examination for [place].  Hearing/Vision screen No exam data present  Dietary issues and exercise activities discussed:    Goals    . Exercise 150 minutes per week  (moderate activity)     May join the [facility] and try the silver sneaker program      . Gain weight     Will try to add calories to your diet.  Eat some nuts or cheese during the day  Will try you ensure meal replacement at Lunch every day   Here are some energy-dense foods that are perfect for gaining weight:  Nuts: Almonds, walnuts, macadamia nuts, peanuts, etc. Dried fruit: Raisins, dates, prunes and others. High-fat dairy: Whole milk, full-fat yogurt, cheese, cream. Fats and oils: Extra virgin olive oil and avocado oil. Grains: Whole grains like oats and brown rice. Meat: Chicken, beef, pork, lamb, etc. Choose fattier cuts. Tubers: Potatoes, sweet potatoes and yams. Dark chocolate, avocados, peanut butter, coconut milk, granola, trail mixes.    Marland [facility]      Start walking around or get a personal trainer       Depression Screen PHQ 2/9 Scores 02/05/2021 01/05/2021 09/18/2018 08/25/2017 02/02/2017 02/02/2017 12/11/2015  PHQ - 2 Score 0 0 0 0 0 0 0  PHQ- 9 Score - 0 - - - - -    Fall Risk Fall Risk  02/05/2021 02/01/2021 01/05/2021 12/30/2020 06/21/2019  Falls in the past year? 0 1 0 0 1  Comment - - - - Emmi Telephone Survey: data to providers prior to load  Number falls in past yr: 0 0 0 0 1  Comment - - - - Emmi Telephone Survey Actual Response = 1  Injury with Fall? 0 0 0 0 0  Risk for fall due to : - - - - -  Follow up - - - - -  Comment - - - - -    FALL RISK PREVENTION PERTAINING TO THE HOME:  Any stairs in or around the home? Yes  If so, are there any without handrails? Yes  Home free of loose throw rugs in walkways, pet beds, electrical cords, etc? Yes  Adequate lighting in your home to reduce risk of falls? Yes  ASSISTIVE DEVICES UTILIZED TO PREVENT FALLS:  Life alert? No  Use of a cane, walker or w/c? Yes  Grab bars in the bathroom? Yes  Shower chair or bench in shower? Yes  Elevated toilet seat or a handicapped toilet? Yes    Cognitive  Function: Normal cognitive status assessed by direct observation by this Nurse Health Advisor. No abnormalities found.   MMSE - Mini Mental State Exam 09/18/2018 09/18/2018  Not completed: (No Data) (No Data)     6CIT Screen 08/25/2017  What Year? 0 points  What month? 0 points  What time? 0 points  Count back from 20 0 points  Months in reverse 0 points  Repeat phrase 0 points  Total Score 0    Immunizations Immunization History  Administered Date(s) Administered  . Fluad Quad(high Dose 65+) 08/20/2019, 12/14/2020  . Influenza Split 08/31/2011, 09/18/2012  . Influenza Whole 09/11/2008, 08/18/2009, 09/09/2010  . Influenza, High Dose Seasonal PF 09/18/2013, 09/07/2015, 09/20/2016, 08/18/2017, 10/24/2017  . Influenza,inj,Quad PF,6+ Mos 09/15/2014, 08/11/2018  . Influenza-Unspecified 08/21/2018  . PFIZER(Purple Top)SARS-COV-2 Vaccination 01/19/2020, 02/18/2020  . Pneumococcal Conjugate-13 01/29/2014  . Pneumococcal Polysaccharide-23 08/18/2009  . Tetanus 12/24/2012  . Zoster 04/24/2012    TDAP status: Up to date  Flu Vaccine status: Up to date  Pneumococcal vaccine status: Up to date  Covid-19 vaccine status: Completed vaccines  Qualifies for Shingles Vaccine? Yes   Zostavax completed No   Shingrix Completed?: No.    Education has been provided regarding the importance of this vaccine. Patient has been advised to call insurance company to determine out of pocket expense if they have not yet received this vaccine. Advised may also receive vaccine at local pharmacy or Health Dept. Verbalized acceptance and understanding.  Screening Tests Health Maintenance  Topic Date Due  . Hepatitis C Screening  Never done  . COVID-19 Vaccine (3 - Booster for Pfizer series) 08/20/2020  . INFLUENZA VACCINE  06/21/2021  . TETANUS/TDAP  12/24/2022  . COLONOSCOPY (Pts 45-65yrs Insurance coverage will need to be confirmed)  04/25/2024  . DEXA SCAN  Completed  . PNA vac Low Risk Adult   Completed  . HPV VACCINES  Aged Out    Health Maintenance  Health Maintenance Due  Topic Date Due  . Hepatitis C Screening  Never done  . COVID-19 Vaccine (3 - Booster for Pfizer series) 08/20/2020    Colorectal cancer screening: Type of screening: Colonoscopy. Completed 04/26/2019. Repeat every 5 years  Mammogram status: Ordered 03/17/2021. Pt provided with contact info and advised to call to schedule appt.   Bone Density status: Ordered 03/17/2021. Pt provided with contact info and advised to call to schedule appt.  Lung Cancer Screening: (Low Dose CT Chest recommended if Age 37 5-80 years, 30 pack-year currently smoking OR have quit w/in 15years.) does not qualify.   Lung Cancer Screening Referral: n/a  Additional Screening:  Hepatitis C Screening: does not qualify;   Vision Screening: Recommended annual ophthalmology exams for early detection of glaucoma and other disorders of the eye. Is the patient up to date with their annual eye exam?  No  Who is the provider or what is the name of the office in which the patient attends annual eye exams? unknown If pt is not established with a provider, would they like to be referred to a provider to establish care? No .   Dental Screening: Recommended annual dental exams for proper oral hygiene  Community Resource Referral / Chronic Care Management: CRR required this  visit?  yes  CCM required this visit?  No      Plan:     I have personally reviewed and noted the following in the patient's chart:   . Medical and social history . Use of alcohol, tobacco or illicit drugs  . Current medications and supplements . Functional ability and status . Nutritional status . Physical activity . Advanced directives . List of other physicians . Hospitalizations, surgeries, and ER visits in previous 12 months . Vitals . Screenings to include cognitive, depression, and falls . Referrals and appointments  In addition, I have reviewed  and discussed with patient certain preventive protocols, quality metrics, and best practice recommendations. A written personalized care plan for preventive services as well as general preventive health recommendations were provided to patient.     Randel Pigg, LPN   [id number]   Nurse Notes: Patient is no taking any medication per pt , she does not want to take anything.  Advised pt she needs an appointment to see Dr, Sarajane Jews to discuss medication compliance she explained she will make one when she talks to her children. Declined offer to schedule appointment with Dr. Sarajane Jews

## 2021-03-18 NOTE — Telephone Encounter (Signed)
Called the patient to review the EEG. Advised the patient the EEG indicated there was slowing on the Left side of the brain which can indicate seizure focal point. Advised for now patient should continue to take the Keppra 500 mg BID. Pt states she is not taking that medication. Advised that she should not have stopped and appears it was ordered in feb. I provided the patient with the name and had her write the name down so she could check through her medications to make sure she is taking it. Advised the patient the med was send to that pharmacy with a year refill, therefore if she is not taking the medication she should resume and call her pharmacy to get back on the medication. Pt verbalized understanding. Pt had no questions at this time but was encouraged to call back if questions arise.

## 2021-03-18 NOTE — Telephone Encounter (Signed)
-----   Message from Larey Seat, MD sent at 03/14/2021  7:12 PM EDT ----- Conclusion this is an abnormal EEG with asymmetry- indicating the left brain to be slower and a possible focus of seizure activity. The patient had undergone serial EEGs in hospital, and she is established at [facility].   Larey Seat, MD

## 2021-03-19 ENCOUNTER — Ambulatory Visit: Payer: Medicare Other | Admitting: Physical Medicine & Rehabilitation

## 2021-03-23 ENCOUNTER — Ambulatory Visit: Payer: Medicare Other | Admitting: Physical Therapy

## 2021-03-23 ENCOUNTER — Encounter: Payer: Self-pay | Admitting: Occupational Therapy

## 2021-03-23 ENCOUNTER — Ambulatory Visit: Payer: Medicare Other | Attending: Physician Assistant | Admitting: Occupational Therapy

## 2021-03-23 ENCOUNTER — Other Ambulatory Visit: Payer: Self-pay

## 2021-03-23 ENCOUNTER — Encounter: Payer: Self-pay | Admitting: Physical Therapy

## 2021-03-23 DIAGNOSIS — R2681 Unsteadiness on feet: Secondary | ICD-10-CM

## 2021-03-23 DIAGNOSIS — M6281 Muscle weakness (generalized): Secondary | ICD-10-CM

## 2021-03-23 DIAGNOSIS — R260 Ataxic gait: Secondary | ICD-10-CM | POA: Insufficient documentation

## 2021-03-23 DIAGNOSIS — R41840 Attention and concentration deficit: Secondary | ICD-10-CM | POA: Insufficient documentation

## 2021-03-23 DIAGNOSIS — R278 Other lack of coordination: Secondary | ICD-10-CM | POA: Insufficient documentation

## 2021-03-23 DIAGNOSIS — R270 Ataxia, unspecified: Secondary | ICD-10-CM | POA: Diagnosis not present

## 2021-03-23 NOTE — Therapy (Signed)
[place]. [place], [place], [address] Phone: [phone]   Fax:  [phone]  Physical Therapy Treatment  Patient Details  Name: Kristine Francis MRN: [id number] Date of Birth: [birth year] Referring Provider (PT): Cathlyn Parsons, PA-C   Encounter Date: 03/23/2021   PT End of Session - 03/23/21 1522    Visit Number 14    Date for PT Re-Evaluation 04/08/21    PT Start Time 1438    PT Stop Time 1521    PT Time Calculation (min) 43 min    Activity Tolerance Patient tolerated treatment well    Behavior During Therapy [facility] for tasks assessed/performed           Past Medical History:  Diagnosis Date  . Allergy   . Asthma    sees Dr. Tiajuana Amass - as child per pt  . At high risk for falls    unstable gait   . Ataxia   . Cataract    removed bilaterally   . Diverticulosis   . Emphysema   . Gait abnormality 05/14/2013   Ms.Erck a patient of Dr. Linda Hedges presented first interpreter thousand 13 with a gait dysfunction, she also had an episodic confusion and Loss device. Exam found a mild nystagmus and she was referred to ophthalmology on 03-2812, brain MRI was normal nystagmus was a secondary diagnosis to vestibulitis, ear nose and throat has followed and had seen the patient. At the time of the nystagmus the patie  . GERD (gastroesophageal reflux disease)   . Hepatic steatosis   . Hyperlipidemia    on atorvastatin   . Hypertension    on losartan   . Iron deficiency anemia, unspecified   . Kidney cysts 11/16/12   small cyst on left  . Nystagmus, end-position   . Osteopenia   . Renal cyst   . Stroke ([place]) 01/2017   mild     Past Surgical History:  Procedure Laterality Date  . ANAL RECTAL MANOMETRY N/A 12/20/2017   Procedure: ANO RECTAL MANOMETRY;  Surgeon: Mauri Pole, MD;  Location: [facility];  Service: Endoscopy;  Laterality: N/A;  . BREAST BIOPSY Bilateral 1970's  . CATARACT EXTRACTION  Bilateral LT:02/14/13,RT:02/21/13  . CERVICAL SPINE SURGERY  2010  . COLONOSCOPY  03-12-14   per Dr. Olevia Perches, diverticulosis only, repeat 5 yrs   . EYE SURGERY Bilateral 01/2013&02/2013   left then right  . HERNIA REPAIR  Oct '14-left, remote-right   years ago right; left done '14  . SKIN CANCER EXCISION  05/13/13   FACE, basal cell  . TONSILLECTOMY      There were no vitals filed for this visit.   Subjective Assessment - 03/23/21 1439    Subjective Pt states no new changes since last rx.    Currently in Pain? No/denies                             [facility] Adult PT Treatment/Exercise - 03/23/21 0001      Knee/Hip Exercises: Aerobic   Nustep L5 x 8 min      Knee/Hip Exercises: Seated   Long Arc Quad Both;2 sets;10 reps    Long Arc Quad Weight 3 lbs.    Ball Squeeze 1x15 3 sec hold    Clamshell with TheraBand Red   2x15   Other Seated Knee/Hip Exercises working on upright posture with 2# weighted trunk rotations    Marching  Both;10 reps;2 sets    Marching Weights 3 lbs.    Hamstring Curl Both;2 sets;10 reps    Hamstring Limitations red tb    Sit to Sand 20 reps;without UE support   CGA with occasional posterior LOB; cues for eccentric control     Shoulder Exercises: Seated   Other Seated Exercises scap retraction with tactile cues for upright posture 2x10 3 sec hold                    PT Short Term Goals - 01/13/21 1511      PT SHORT TERM GOAL #1   Title Pt and caregiver/spouse independent with initiating initial HEP    Baseline spouse reports difficult to get pt to maintain attention for HEP; educated on providing visual cuing    Time 2    Period Weeks    Status On-going    Target Date 01/18/21      PT SHORT TERM GOAL #2   Title Pt will improve five time sit to stand from mat, no UE support to </= 15 seconds but with improved anterior weight shift and decreased posterior LOB    Baseline able to perform with no UE support but does demo lack of  anterior weight shift and occasional posterior LOB    Time 4    Period Weeks    Status On-going    Target Date 02/01/21      PT SHORT TERM GOAL #3   Title Pt will demo improved static standing balance to at least 1 minute with close supervision and minimal sway, without LOB    Baseline ~20 sec this rx    Time 4    Period Weeks    Status On-going    Target Date 02/01/21      PT SHORT TERM GOAL #4   Title Pt and caregiver will demo good understanding of falls prevention and safe functional mobility within the home to prevent falls    Time 2    Period Weeks    Status On-going    Target Date 01/18/21             PT Long Term Goals - 03/09/21 1601      PT LONG TERM GOAL #1   Title Pt will demonstrate independence with final HEP    Time 8    Period Weeks    Status On-going      PT LONG TERM GOAL #2   Title Pt will improve five time sit to stand without use of UE to </= 13 seconds and no posterior LOB    Baseline 20.74  sec; truncal ataxia but no LOB. Mod cuing for sequencing with STS to avoid posterior LOB    Time 8    Period Weeks    Status Partially Met      PT LONG TERM GOAL #3   Title Pt will demo BLE strength at least 4+/5    Baseline 5/5 BLE    Time 8    Period Weeks    Status Achieved      PT LONG TERM GOAL #4   Title Pt will improve BERG by at least 10  points to indicate decreased falls risk    Baseline 25/56: no signficant changes in Berg Score since time of eval    Time 8    Period Weeks    Status On-going      PT LONG TERM GOAL #5   Title Pt will ambulate  and complete direction changes with least restrictive assistive device safely without LOB to facilitate improved safety with community negotiation    Baseline pt requires supervision-CGA when ambulating with RW    Time 8    Period Weeks    Status On-going                 Plan - 03/23/21 1523    Clinical Impression Statement Pt required close supervision-CGA with STS today d/t occasional  posterior LOB. Cues to avoid LEs locked on back of chair. Worked a lot on maintaining upright posture during seated LE exercises this rx. Cues to decrease thoraicc kyphosis. Close supervision-CGA with ambulation around clinic this rx with cues to keep RW close to body. Continue to progress to tolerance with plan to d/c at last scheduled rx.    PT Treatment/Interventions ADLs/Self Care Home Management;Aquatic Therapy;DME Instruction;Gait training;Stair training;Functional mobility training;Therapeutic activities;Therapeutic exercise;Balance training;Neuromuscular re-education;Patient/family education;Vestibular;Visual/perceptual remediation/compensation;Manual techniques;Taping    PT Next Visit Plan Progressive TE and balance/neurore-ed exercises as tolerated. Frequent cues for safety. Standing balance/vestibular/multi-sensory balance activities, postural exercises, dynamic balance/ balance recovery.    Consulted and Agree with Plan of Care Patient;Family member/caregiver    Family Member Consulted husband           Patient will benefit from skilled therapeutic intervention in order to improve the following deficits and impairments:  Abnormal gait,Decreased balance,Decreased coordination,Decreased strength,Difficulty walking,Impaired tone,Impaired UE functional use,Impaired vision/preception,Postural dysfunction,Decreased mobility,Decreased safety awareness,Decreased endurance  Visit Diagnosis: Muscle weakness (generalized)  Other lack of coordination  Ataxic gait  Unsteadiness on feet     Problem List Patient Active Problem List   Diagnosis Date Noted  . Localization-related idiopathic epilepsy and epileptic syndromes with seizures of localized onset, not intractable, with status epilepticus ([place]) 03/14/2021  . Dysphagia 03/14/2021  . Dysphonia 03/14/2021  . Dementia arising in the senium and presenium ([place]) 03/14/2021  . PAF (paroxysmal atrial fibrillation) ([place]) 12/30/2020  . Heme +  stool 12/27/2020  . Hyponatremia 12/27/2020  . Anemia 12/27/2020  . Protein-calorie malnutrition, severe 12/16/2020  . Encephalopathy 12/10/2020  . Grand mal seizure ([place]) 12/02/2020  . COPD (chronic obstructive pulmonary disease) ([place]) 12/02/2020  . Malnutrition of moderate degree 11/28/2020  . Acute encephalopathy   . Acute respiratory failure with hypoxia ([place])   . Aspiration into airway   . Cardiac arrest ([place])   . Groin mass 08/20/2020  . Falls infrequently 03/27/2019  . Sensory neuropathy 02/01/2019  . 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 Cerebellar ataxia ([place]) 08/23/2018  . Sensory polyneuropathy 08/23/2018  . Incontinence of feces   . Visual disturbances 02/06/2017  . Essential hypertension 11/07/2016  . Hyperlipidemia 04/24/2015  . Dysuria 02/11/2015  . Acute cystitis without hematuria 02/11/2015  . Chest pain 02/06/2015  . Left inguinal hernia 08/02/2013  . Gait abnormality 05/14/2013  . Gait disorder   . Nystagmus, end-position   . Routine health maintenance 12/13/2011  . LEG CRAMPS, NOCTURNAL 12/07/2010  . CALLUS, TOE 03/19/2010  . hip pain 03/19/2010  . ATAXIA 11/17/2008  . CERVICAL RADICULOPATHY, RIGHT 10/21/2008  . DIVERTICULOSIS OF COLON 08/14/2008  . RENAL CYST 08/14/2008  . ANEMIA-IRON DEFICIENCY 06/16/2007  . Asthma 06/16/2007  . GERD 06/16/2007  . OSTEOPENIA 06/16/2007   Amador Cunas, PT, DPT Donald Prose Dequita Schleicher 03/23/2021, 3:25 PM  [place]. [place], [place], [address] Phone: [phone]   Fax:  [phone]  Name: Kristine Francis MRN: [id number] Date of Birth: 1941/08/10

## 2021-03-23 NOTE — Patient Instructions (Signed)
Access Code: [email or web address] URL: [email or web address] Date: 03/23/2021 Prepared by: Merleen Nicely, OTR/L  Exercises - Seated Overhead Press with Bar - Seated Chest Press with Bar - Seated Shoulder Abduction with Dowel - Seated Bicep Curls with Bar - Seated Elbow Extension with Dowel - Gripping Sponge/Towel Roll  **Try to complete about 2 sets of 10 repetitions of each exercise!

## 2021-03-24 ENCOUNTER — Other Ambulatory Visit: Payer: Self-pay

## 2021-03-24 ENCOUNTER — Ambulatory Visit: Payer: Medicare Other

## 2021-03-24 ENCOUNTER — Telehealth: Payer: Self-pay

## 2021-03-24 MED ORDER — AMLODIPINE BESYLATE 2.5 MG PO TABS: 2.5000 mg | ORAL_TABLET | Freq: Every day | ORAL | 0 refills | Status: DC

## 2021-03-24 MED ORDER — METOPROLOL TARTRATE 25 MG PO TABS: 25.0000 mg | ORAL_TABLET | Freq: Two times a day (BID) | ORAL | 0 refills | Status: DC

## 2021-03-24 MED ORDER — ATORVASTATIN CALCIUM 10 MG PO TABS: 1.0000 | ORAL_TABLET | Freq: Every day | ORAL | 0 refills | Status: DC

## 2021-03-24 MED ORDER — PANTOPRAZOLE SODIUM 20 MG PO TBEC: 20.0000 mg | DELAYED_RELEASE_TABLET | Freq: Every day | ORAL | 0 refills | Status: DC

## 2021-03-24 MED ORDER — LEVETIRACETAM 500 MG PO TABS: 500.0000 mg | ORAL_TABLET | Freq: Two times a day (BID) | ORAL | 0 refills | Status: DC

## 2021-03-24 MED ORDER — APIXABAN 5 MG PO TABS: 5.0000 mg | ORAL_TABLET | Freq: Two times a day (BID) | ORAL | 0 refills | Status: DC

## 2021-03-24 NOTE — Therapy (Signed)
[place]. [place], [place], [address] Phone: [phone]   Fax:  [phone]  Occupational Therapy Treatment & Discharge Summary  Patient Details  Name: Kristine Francis MRN: [id number] Date of Birth: 09-19-1941 Referring Provider (OT): Lauraine Rinne, PA-C   Encounter Date: 03/23/2021   OT End of Session - 03/23/21 1529    Visit Number 17    Number of Visits 17    Date for OT Re-Evaluation 03/24/21    Authorization Type Medicare    Progress Note Due on Visit 20    OT Start Time 1525    OT Stop Time 1610    OT Time Calculation (min) 45 min    Activity Tolerance Patient tolerated treatment well    Behavior During Therapy [facility] for tasks assessed/performed   Confusion          Past Medical History:  Diagnosis Date  . Allergy   . Asthma    sees Dr. Tiajuana Amass - as child per pt  . At high risk for falls    unstable gait   . Ataxia   . Cataract    removed bilaterally   . Diverticulosis   . Emphysema   . Gait abnormality 05/14/2013   Ms.Speelman a patient of Dr. Linda Hedges presented first interpreter thousand 13 with a gait dysfunction, she also had an episodic confusion and Loss device. Exam found a mild nystagmus and she was referred to ophthalmology on 03-2812, brain MRI was normal nystagmus was a secondary diagnosis to vestibulitis, ear nose and throat has followed and had seen the patient. At the time of the nystagmus the patie  . GERD (gastroesophageal reflux disease)   . Hepatic steatosis   . Hyperlipidemia    on atorvastatin   . Hypertension    on losartan   . Iron deficiency anemia, unspecified   . Kidney cysts 11/16/12   small cyst on left  . Nystagmus, end-position   . Osteopenia   . Renal cyst   . Stroke ([place]) 01/2017   mild     Past Surgical History:  Procedure Laterality Date  . ANAL RECTAL MANOMETRY N/A 12/20/2017   Procedure: ANO RECTAL MANOMETRY;  Surgeon: Mauri Pole, MD;   Location: [facility];  Service: Endoscopy;  Laterality: N/A;  . BREAST BIOPSY Bilateral 1970's  . CATARACT EXTRACTION Bilateral LT:02/14/13,RT:02/21/13  . CERVICAL SPINE SURGERY  2010  . COLONOSCOPY  03-12-14   per Dr. Olevia Perches, diverticulosis only, repeat 5 yrs   . EYE SURGERY Bilateral 01/2013&02/2013   left then right  . HERNIA REPAIR  Oct '14-left, remote-right   years ago right; left done '14  . SKIN CANCER EXCISION  05/13/13   FACE, basal cell  . TONSILLECTOMY      There were no vitals filed for this visit.   Subjective Assessment - 03/23/21 1528    Subjective  "This is heavier than it looks; tell [name] to remind me to do my exercises"    Patient is accompanied by: Family member   [name] (husband)   Pertinent History CVA (March 2018), ataxia, COPD, osteopenia, neuropathy, gait abnormality, asthma, iron deficiency anemia, GERD, and hx of nystagmus    Patient Stated Goals Get back to being independent; play tennis again    Currently in Pain? No/denies             OT Treatments/Exercises (OP) - 03/23/21 1547      ADLs   Cooking Simulated simple meal  prep activity in sitting w/ pt able to sequence steps and identify needed utensils/ingredients at [facility] for safety      Shoulder Exercises: Seated   Row Strengthening;AROM;Both;20 reps   Chest press; 2 sets of 10 reps   Row Weight (lbs) 2# dowel rod    Flexion Strengthening;AROM;Both;20 reps   Overhead press; 2 sets of 10 reps   Flexion Weight (lbs) 2# dowel rod    Abduction Strengthening;AROM;Right;Left;20 reps   2 sets of 10 reps   ABduction Weight (lbs) 2# dowel rod    ABduction Limitations OT provided cueing to fully extend elbow when completing exercise on R side      Elbow Exercises   Other elbow exercises Bilateral bicep curls and R elbow extension; 2 sets of 10 reps each exercise using 2# dowel rod; completed both in sitting position             OT Education - 03/23/21 1600    Education Details OT reviewed all goals  with pt and provided education on HEP w/ updated handout administered    Person(s) Educated Patient    Methods Explanation;Handout    Comprehension Verbalized understanding            OT Short Term Goals - 02/11/21 1751      OT SHORT TERM GOAL #1   Title Pt will be able to complete initial HEP with Min A from caregiver    Baseline No current HEP    Time 3    Period Weeks    Status Partially Met   02/03/21 - pt is able to complete initial HEP but spouse reports non-compliance at home   Target Date 01/26/21      OT SHORT TERM GOAL #2   Title Pt will be able to complete asymmetrical bilateral coordination activities independently in at least 3/4 trials to improve functional UE use during ADLs.    Baseline Decreased BUE GM coordination    Time 3    Period Weeks    Status Achieved   02/11/21 - completed asymmetrical bilateral coordination during [facility] activity w/out difficulty in multiple trials            OT Long Term Goals - 03/23/21 1538      OT LONG TERM GOAL #1   Title Pt will complete HEP designed for BUE strength and coordination with Mod I and report carryover to home    Baseline No current HEP    Time 6    Period Weeks    Status Not Met   02/09/21 - spouse reports pt is not compliant w/ HEP     OT LONG TERM GOAL #2   Title Pt will demonstrate preparing a simple meal safely with Min A    Baseline Currently unable to prepare meals safely    Time 6    Period Weeks    Status Partially Met   03/23/21 - completed simulated simple meal prep w/ SPV     OT LONG TERM GOAL #3   Title Pt will be able to safely complete UB/LB dressing with Mod I at least 75% of the time    Baseline Currently SPV/SBA for dressing    Time 6    Period Weeks    Status Achieved   02/09/21 - pt and her spouse report she is able to get her clothes out and get herself dressed w/ Mod I     OT LONG TERM GOAL #4   Title Pt will be  able to manipulate clothing fasteners without assistance in at least 2/3  trials    Baseline Decreased [place] as evidenced by time on 9-Hole Peg Test    Time 6    Period Weeks    Status Achieved   03/09/21 - able to clothing string w/ Mod I in 3/3 trials     OT LONG TERM GOAL #5   Title Pt's spouse will verbalize understanding of at least 2 compensatory/safety strategies to improve pt's safety with ADLs and aid in fall prevention    Baseline Decreased safety awareness and recall; no current compensatory strategies for ADLs    Time 6    Period Weeks    Status Achieved   03/23/21 - pt's spouse able to identify/verbalize understanding of more than 2 compensatory/safety strategies for home            Plan - 03/23/21 1615    Clinical Impression Statement In preparation for discharge for OP OT, therapist reviewed goals w/ pt and discussed importance of continuing w/ HEP at home; updated handout administerd to pt. Due to pt's decreased memory and poor awareness of deficits, carryover of HEP and other learned techniques/strategies has been very limited; pt is able to complete all BUE exercises included in HEP at this time, but is not compliant at home. Pt's bilateral coordination, UE strength, as well as LUE coordination have improved since evaluation session, but due to lack of continued, measurable progress as well as poor carryover to home, pt is appropriate for discharge from skilled OT services at this time; pt and spouse both agreeable. OT encouraged pt to call back with any concerns or questions.    OT Occupational Profile and History Detailed Assessment- Review of Records and additional review of physical, cognitive, psychosocial history related to current functional performance    Occupational performance deficits (Please refer to evaluation for details): ADL's;IADL's;Leisure    Body Structure / Function / Physical Skills ADL;Decreased knowledge of precautions;UE functional use;FMC;Dexterity;Body mechanics;Decreased knowledge of use of  DME;Balance;Vision;GMC;Strength;IADL;Coordination    Cognitive Skills Safety Awareness    Rehab Potential Good    Clinical Decision Making Several treatment options, min-mod task modification necessary    Comorbidities Affecting Occupational Performance: Presence of comorbidities impacting occupational performance    Modification or Assistance to Complete Evaluation  Min-Moderate modification of tasks or assist with assess necessary to complete eval    OT Frequency 1x / week    OT Duration 4 weeks    OT Treatment/Interventions Self-care/ADL training;Therapeutic exercise;Visual/perceptual remediation/compensation;Aquatic Therapy;Neuromuscular education;Patient/family education;Energy conservation;Therapeutic activities;DME and/or AE instruction;Manual Therapy;Cognitive remediation/compensation    Plan D/C    Consulted and Agree with Plan of Care Patient;Family member/caregiver    Family Member Consulted Husband [name])           Patient will benefit from skilled therapeutic intervention in order to improve the following deficits and impairments:   Body Structure / Function / Physical Skills: ADL,Decreased knowledge of precautions,UE functional use,FMC,Dexterity,Body mechanics,Decreased knowledge of use of DME,Balance,Vision,GMC,Strength,IADL,Coordination Cognitive Skills: Safety Awareness     OCCUPATIONAL THERAPY DISCHARGE SUMMARY  Visits from Start of Care: 17  Current functional level related to goals / functional outcomes: Pt is able to complete asymmetrical bilateral coordination activities w/out difficulty or cueing, is able to prepare simple meals w/ SPV, can complete UB and LB dressing w/ Mod I and is able to manipulate clothing fasteners w/ extended time. Pt also improved 9-HPT on L (non-dominant) side by 46 seconds; R side remained the same as during  evaluation   Remaining deficits: Decreased BUE strength and overall endurance, ataxia, poor awareness of deficits, fall risk, and  decreased memory and problem-solving   Education / Equipment: Fall prevention, energy conservation, compensatory strategies, HEP   Plan: Patient agrees to discharge.  Patient goals were partially met.  Patient is being discharged due to lack of progress.  ?????      Visit Diagnosis: Ataxia  Muscle weakness (generalized)  Other lack of coordination  Attention and concentration deficit    Problem List Patient Active Problem List   Diagnosis Date Noted  . Localization-related idiopathic epilepsy and epileptic syndromes with seizures of localized onset, not intractable, with status epilepticus ([place]) 03/14/2021  . Dysphagia 03/14/2021  . Dysphonia 03/14/2021  . Dementia arising in the senium and presenium ([place]) 03/14/2021  . PAF (paroxysmal atrial fibrillation) ([place]) 12/30/2020  . Heme + stool 12/27/2020  . Hyponatremia 12/27/2020  . Anemia 12/27/2020  . Protein-calorie malnutrition, severe 12/16/2020  . Encephalopathy 12/10/2020  . Grand mal seizure ([place]) 12/02/2020  . COPD (chronic obstructive pulmonary disease) ([place]) 12/02/2020  . Malnutrition of moderate degree 11/28/2020  . Acute encephalopathy   . Acute respiratory failure with hypoxia ([place])   . Aspiration into airway   . Cardiac arrest ([place])   . Groin mass 08/20/2020  . Falls infrequently 03/27/2019  . Sensory neuropathy 02/01/2019  . 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 Cerebellar ataxia ([place]) 08/23/2018  . Sensory polyneuropathy 08/23/2018  . Incontinence of feces   . Visual disturbances 02/06/2017  . Essential hypertension 11/07/2016  . Hyperlipidemia 04/24/2015  . Dysuria 02/11/2015  . Acute cystitis without hematuria 02/11/2015  . Chest pain 02/06/2015  . Left inguinal hernia 08/02/2013  . Gait abnormality 05/14/2013  . Gait disorder   . Nystagmus, end-position   . Routine health maintenance 12/13/2011  . LEG CRAMPS, NOCTURNAL 12/07/2010  . CALLUS, TOE 03/19/2010  . hip pain 03/19/2010  . ATAXIA  11/17/2008  . CERVICAL RADICULOPATHY, RIGHT 10/21/2008  . DIVERTICULOSIS OF COLON 08/14/2008  . RENAL CYST 08/14/2008  . ANEMIA-IRON DEFICIENCY 06/16/2007  . Asthma 06/16/2007  . GERD 06/16/2007  . OSTEOPENIA 06/16/2007     Kathrine Cords, OTR/L, MSOT 03/23/2021, 4:15 PM  [place]. [place], [place], [address] Phone: [phone]   Fax:  [phone]  Name: LATICIA VANNOSTRAND MRN: [id number] Date of Birth: 20-Nov-1941

## 2021-03-24 NOTE — Telephone Encounter (Signed)
Our office received [facility] for [facility]ills, spoke with pt and husband confirmed that they wanted to transfer all their Rx to [facility]. Pt state that she has enough refills on all her medications just needs new Rx sent to the new pharmacy for future refills. All Rx cancelled from local pharmacy [facility] and [facility] to [facility] per [facility] request

## 2021-03-30 ENCOUNTER — Encounter: Payer: Medicare Other | Admitting: Occupational Therapy

## 2021-03-30 ENCOUNTER — Other Ambulatory Visit: Payer: Self-pay

## 2021-03-30 ENCOUNTER — Ambulatory Visit: Payer: Medicare Other | Admitting: Physical Therapy

## 2021-03-30 ENCOUNTER — Encounter: Payer: Self-pay | Admitting: Physical Therapy

## 2021-03-30 DIAGNOSIS — R278 Other lack of coordination: Secondary | ICD-10-CM | POA: Diagnosis not present

## 2021-03-30 DIAGNOSIS — R270 Ataxia, unspecified: Secondary | ICD-10-CM | POA: Diagnosis not present

## 2021-03-30 DIAGNOSIS — R2681 Unsteadiness on feet: Secondary | ICD-10-CM

## 2021-03-30 DIAGNOSIS — R260 Ataxic gait: Secondary | ICD-10-CM | POA: Diagnosis not present

## 2021-03-30 DIAGNOSIS — R41840 Attention and concentration deficit: Secondary | ICD-10-CM | POA: Diagnosis not present

## 2021-03-30 DIAGNOSIS — M6281 Muscle weakness (generalized): Secondary | ICD-10-CM | POA: Diagnosis not present

## 2021-03-30 NOTE — Therapy (Signed)
[place] [place], [place], [address] Phone: [phone]   Fax:  [phone]  Physical Therapy Treatment  Patient Details  Name: Kristine Francis MRN: [id number] Date of Birth: [birth year] Referring Provider (Francis): Kristine Parsons, PA-C   Encounter Date: 03/30/2021   Francis End of Session - 03/30/21 1529    Visit Number 15    Francis Start Time 6606    Francis Stop Time 1529    Francis Time Calculation (min) 36 min    Activity Tolerance Patient tolerated treatment well    Behavior During Therapy [facility] for tasks assessed/performed           Past Medical History:  Diagnosis Date  . Allergy   . Asthma    sees Dr. Tiajuana Francis - as child per Francis  . At high risk for falls    unstable gait   . Ataxia   . Cataract    removed bilaterally   . Diverticulosis   . Emphysema   . Gait abnormality 05/14/2013   Kristine Francis a patient of Kristine Francis presented first interpreter thousand 13 with a gait dysfunction, she also had an episodic confusion and Loss device. Exam found a mild nystagmus and she was referred to ophthalmology on 03-2812, brain MRI was normal nystagmus was a secondary diagnosis to vestibulitis, ear nose and throat has followed and had seen the patient. At the time of the nystagmus the patie  . GERD (gastroesophageal reflux disease)   . Hepatic steatosis   . Hyperlipidemia    on atorvastatin   . Hypertension    on losartan   . Iron deficiency anemia, unspecified   . Kidney cysts 11/16/12   small cyst on left  . Nystagmus, end-position   . Osteopenia   . Renal cyst   . Stroke ([place]) 01/2017   mild     Past Surgical History:  Procedure Laterality Date  . ANAL RECTAL MANOMETRY N/A 12/20/2017   Procedure: ANO RECTAL MANOMETRY;  Surgeon: Kristine Pole, MD;  Location: [facility];  Service: Endoscopy;  Laterality: N/A;  . BREAST BIOPSY Bilateral 1970's  . CATARACT EXTRACTION Bilateral LT:02/14/13,RT:02/21/13  . CERVICAL  SPINE SURGERY  2010  . COLONOSCOPY  03-12-14   per Dr. Olevia Francis, diverticulosis only, repeat 5 yrs   . EYE SURGERY Bilateral 01/2013&02/2013   left then right  . HERNIA REPAIR  Oct '14-left, remote-right   years ago right; left done '14  . SKIN CANCER EXCISION  05/13/13   FACE, basal cell  . TONSILLECTOMY      There were no vitals filed for this visit.   Subjective Assessment - 03/30/21 1500    Subjective Francis states no new changes since last rx.    Currently in Pain? No/denies                             [facility] Adult Francis Treatment/Exercise - 03/30/21 0001      Knee/Hip Exercises: Aerobic   Nustep L5 x 8 min      Knee/Hip Exercises: Seated   Long Arc Quad Both;2 sets;10 reps    Long Arc Quad Weight 3 lbs.    Ball Squeeze 1x15 3 sec hold    Clamshell with TheraBand Red   2x15   Other Seated Knee/Hip Exercises working on upright posture with 2# weighted trunk rotations    Marching Both;10 reps;2 sets    [employer]  3 lbs.    Hamstring Curl Both;2 sets;10 reps    Hamstring Limitations red tb    Sit to Sand 20 reps;without UE support                    Francis Short Term Goals - 01/13/21 1511      Francis SHORT TERM GOAL #1   Title Francis and caregiver/spouse independent with initiating initial HEP    Baseline spouse reports difficult to get Francis to maintain attention for HEP; educated on providing visual cuing    Time 2    Period Weeks    Status On-going    Target Date 01/18/21      Francis SHORT TERM GOAL #2   Title Francis will improve five time sit to stand from mat, no UE support to </= 15 seconds but with improved anterior weight shift and decreased posterior LOB    Baseline able to perform with no UE support but does demo lack of anterior weight shift and occasional posterior LOB    Time 4    Period Weeks    Status On-going    Target Date 02/01/21      Francis SHORT TERM GOAL #3   Title Francis will demo improved static standing balance to at least 1 minute with close  supervision and minimal sway, without LOB    Baseline ~20 sec this rx    Time 4    Period Weeks    Status On-going    Target Date 02/01/21      Francis SHORT TERM GOAL #4   Title Francis and caregiver will demo good understanding of falls prevention and safe functional mobility within the home to prevent falls    Time 2    Period Weeks    Status On-going    Target Date 01/18/21             Francis Long Term Goals - 03/09/21 1601      Francis LONG TERM GOAL #1   Title Francis will demonstrate independence with final HEP    Time 8    Period Weeks    Status On-going      Francis LONG TERM GOAL #2   Title Francis will improve five time sit to stand without use of UE to </= 13 seconds and no posterior LOB    Baseline 20.74  sec; truncal ataxia but no LOB. Mod cuing for sequencing with STS to avoid posterior LOB    Time 8    Period Weeks    Status Partially Met      Francis LONG TERM GOAL #3   Title Francis will demo BLE strength at least 4+/5    Baseline 5/5 BLE    Time 8    Period Weeks    Status Achieved      Francis LONG TERM GOAL #4   Title Francis will improve BERG by at least 10  points to indicate decreased falls risk    Baseline 25/56: no signficant changes in Berg Score since time of eval    Time 8    Period Weeks    Status On-going      Francis LONG TERM GOAL #5   Title Francis will ambulate and complete direction changes with least restrictive assistive device safely without LOB to facilitate improved safety with community negotiation    Baseline Francis requires supervision-CGA when ambulating with RW    Time 8    Period Weeks  Status On-going                 Plan - 03/30/21 1529    Clinical Impression Statement Francis ~7 min late this rx. Francis demos decreased carryover this rx for STS with no UE support; CGA-minA d/t feet sliding and occasional posterior LOB. With ambulation, cues to keep RW close to body. Continue to progress to tolerance with plan to d/c at last scheduled rx.    Francis Treatment/Interventions  ADLs/Self Care Home Management;Aquatic Therapy;DME Instruction;Gait training;Stair training;Functional mobility training;Therapeutic activities;Therapeutic exercise;Balance training;Neuromuscular re-education;Patient/family education;Vestibular;Visual/perceptual remediation/compensation;Manual techniques;Taping    Francis Next Visit Plan Progressive TE and balance/neurore-ed exercises as tolerated. Frequent cues for safety. Standing balance/vestibular/multi-sensory balance activities, postural exercises, dynamic balance/ balance recovery.    Consulted and Agree with Plan of Care Patient;Family member/caregiver    Family Member Consulted husband           Patient will benefit from skilled therapeutic intervention in order to improve the following deficits and impairments:  Abnormal gait,Decreased balance,Decreased coordination,Decreased strength,Difficulty walking,Impaired tone,Impaired UE functional use,Impaired vision/preception,Postural dysfunction,Decreased mobility,Decreased safety awareness,Decreased endurance  Visit Diagnosis: Muscle weakness (generalized)  Other lack of coordination  Ataxic gait  Unsteadiness on feet     Problem List Patient Active Problem List   Diagnosis Date Noted  . Localization-related idiopathic epilepsy and epileptic syndromes with seizures of localized onset, not intractable, with status epilepticus ([place]) 03/14/2021  . Dysphagia 03/14/2021  . Dysphonia 03/14/2021  . Dementia arising in the senium and presenium ([place]) 03/14/2021  . PAF (paroxysmal atrial fibrillation) ([place]) 12/30/2020  . Heme + stool 12/27/2020  . Hyponatremia 12/27/2020  . Anemia 12/27/2020  . Protein-calorie malnutrition, severe 12/16/2020  . Encephalopathy 12/10/2020  . Grand mal seizure ([place]) 12/02/2020  . COPD (chronic obstructive pulmonary disease) ([place]) 12/02/2020  . Malnutrition of moderate degree 11/28/2020  . Acute encephalopathy   . Acute respiratory failure with hypoxia  ([place])   . Aspiration into airway   . Cardiac arrest ([place])   . Groin mass 08/20/2020  . Falls infrequently 03/27/2019  . Sensory neuropathy 02/01/2019  . 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 Cerebellar ataxia ([place]) 08/23/2018  . Sensory polyneuropathy 08/23/2018  . Incontinence of feces   . Visual disturbances 02/06/2017  . Essential hypertension 11/07/2016  . Hyperlipidemia 04/24/2015  . Dysuria 02/11/2015  . Acute cystitis without hematuria 02/11/2015  . Chest pain 02/06/2015  . Left inguinal hernia 08/02/2013  . Gait abnormality 05/14/2013  . Gait disorder   . Nystagmus, end-position   . Routine health maintenance 12/13/2011  . LEG CRAMPS, NOCTURNAL 12/07/2010  . CALLUS, TOE 03/19/2010  . hip pain 03/19/2010  . ATAXIA 11/17/2008  . CERVICAL RADICULOPATHY, RIGHT 10/21/2008  . DIVERTICULOSIS OF COLON 08/14/2008  . RENAL CYST 08/14/2008  . ANEMIA-IRON DEFICIENCY 06/16/2007  . Asthma 06/16/2007  . GERD 06/16/2007  . OSTEOPENIA 06/16/2007   Amador Cunas, Francis, DPT [facility]  03/30/2021, 3:31 PM  [place] [place], [place], [address] Phone: [phone]   Fax:  [phone]  Name: Kristine Francis MRN: [id number] Date of Birth: 24-Jun-1941

## 2021-04-06 ENCOUNTER — Encounter: Payer: Medicare Other | Admitting: Occupational Therapy

## 2021-04-06 ENCOUNTER — Encounter: Payer: Self-pay | Admitting: Physical Therapy

## 2021-04-06 ENCOUNTER — Ambulatory Visit: Payer: Medicare Other | Admitting: Physical Therapy

## 2021-04-06 ENCOUNTER — Other Ambulatory Visit: Payer: Self-pay

## 2021-04-06 DIAGNOSIS — R2681 Unsteadiness on feet: Secondary | ICD-10-CM | POA: Diagnosis not present

## 2021-04-06 DIAGNOSIS — R260 Ataxic gait: Secondary | ICD-10-CM

## 2021-04-06 DIAGNOSIS — R41840 Attention and concentration deficit: Secondary | ICD-10-CM | POA: Diagnosis not present

## 2021-04-06 DIAGNOSIS — R278 Other lack of coordination: Secondary | ICD-10-CM | POA: Diagnosis not present

## 2021-04-06 DIAGNOSIS — M6281 Muscle weakness (generalized): Secondary | ICD-10-CM

## 2021-04-06 DIAGNOSIS — R270 Ataxia, unspecified: Secondary | ICD-10-CM | POA: Diagnosis not present

## 2021-04-06 NOTE — Therapy (Signed)
[place] [place], [place], [address] Phone: [phone]   Fax:  [phone]  Physical Therapy Treatment  Patient Details  Name: Kristine Francis MRN: [id number] Date of Birth: [birth year] Referring Provider (PT): Cathlyn Parsons, PA-C   Encounter Date: 04/06/2021   PT End of Session - 04/06/21 1522    Visit Number 16    Date for PT Re-Evaluation 05/07/21    PT Start Time 2778    PT Stop Time 1523    PT Time Calculation (min) 42 min    Activity Tolerance Patient tolerated treatment well    Behavior During Therapy [facility] for tasks assessed/performed           Past Medical History:  Diagnosis Date  . Allergy   . Asthma    sees Dr. Tiajuana Amass - as child per pt  . At high risk for falls    unstable gait   . Ataxia   . Cataract    removed bilaterally   . Diverticulosis   . Emphysema   . Gait abnormality 05/14/2013   Kristine Francis a patient of Dr. Linda Hedges presented first interpreter thousand 13 with a gait dysfunction, she also had an episodic confusion and Loss device. Exam found a mild nystagmus and she was referred to ophthalmology on 03-2812, brain MRI was normal nystagmus was a secondary diagnosis to vestibulitis, ear nose and throat has followed and had seen the patient. At the time of the nystagmus the patie  . GERD (gastroesophageal reflux disease)   . Hepatic steatosis   . Hyperlipidemia    on atorvastatin   . Hypertension    on losartan   . Iron deficiency anemia, unspecified   . Kidney cysts 11/16/12   small cyst on left  . Nystagmus, end-position   . Osteopenia   . Renal cyst   . Stroke ([place]) 01/2017   mild     Past Surgical History:  Procedure Laterality Date  . ANAL RECTAL MANOMETRY N/A 12/20/2017   Procedure: ANO RECTAL MANOMETRY;  Surgeon: Mauri Pole, MD;  Location: [facility];  Service: Endoscopy;  Laterality: N/A;  . BREAST BIOPSY Bilateral 1970's  . CATARACT EXTRACTION  Bilateral LT:02/14/13,RT:02/21/13  . CERVICAL SPINE SURGERY  2010  . COLONOSCOPY  03-12-14   per Dr. Olevia Perches, diverticulosis only, repeat 5 yrs   . EYE SURGERY Bilateral 01/2013&02/2013   left then right  . HERNIA REPAIR  Oct '14-left, remote-right   years ago right; left done '14  . SKIN CANCER EXCISION  05/13/13   FACE, basal cell  . TONSILLECTOMY      There were no vitals filed for this visit.   Subjective Assessment - 04/06/21 1444    Subjective Pt states no new changes since last rx.    Currently in Pain? No/denies                             [facility] Adult PT Treatment/Exercise - 04/06/21 0001      Ambulation/Gait   Gait Comments gait with RW x100 ft x2 with cues for RW maintained close to body      Knee/Hip Exercises: Aerobic   Nustep L5 x 8 min      Knee/Hip Exercises: Seated   Long Arc Quad Both;2 sets;10 reps    Long Arc Quad Weight 3 lbs.    Ball Squeeze 1x15 3 sec hold    Clamshell with  TheraBand Red   x15   Other Seated Knee/Hip Exercises working on upright posture with 2# weighted trunk rotations    Other Seated Knee/Hip Exercises seated postural ex's including 2# chest press, red tb rows/extensions    Marching Both;10 reps;2 sets    Marching Weights 3 lbs.    Hamstring Curl Both;2 sets;10 reps    Hamstring Limitations red tb    Sit to Sand 20 reps;without UE support                    PT Short Term Goals - 01/13/21 1511      PT SHORT TERM GOAL #1   Title Pt and caregiver/spouse independent with initiating initial HEP    Baseline spouse reports difficult to get pt to maintain attention for HEP; educated on providing visual cuing    Time 2    Period Weeks    Status On-going    Target Date 01/18/21      PT SHORT TERM GOAL #2   Title Pt will improve five time sit to stand from mat, no UE support to </= 15 seconds but with improved anterior weight shift and decreased posterior LOB    Baseline able to perform with no UE support but  does demo lack of anterior weight shift and occasional posterior LOB    Time 4    Period Weeks    Status On-going    Target Date 02/01/21      PT SHORT TERM GOAL #3   Title Pt will demo improved static standing balance to at least 1 minute with close supervision and minimal sway, without LOB    Baseline ~20 sec this rx    Time 4    Period Weeks    Status On-going    Target Date 02/01/21      PT SHORT TERM GOAL #4   Title Pt and caregiver will demo good understanding of falls prevention and safe functional mobility within the home to prevent falls    Time 2    Period Weeks    Status On-going    Target Date 01/18/21             PT Long Term Goals - 03/09/21 1601      PT LONG TERM GOAL #1   Title Pt will demonstrate independence with final HEP    Time 8    Period Weeks    Status On-going      PT LONG TERM GOAL #2   Title Pt will improve five time sit to stand without use of UE to </= 13 seconds and no posterior LOB    Baseline 20.74  sec; truncal ataxia but no LOB. Mod cuing for sequencing with STS to avoid posterior LOB    Time 8    Period Weeks    Status Partially Met      PT LONG TERM GOAL #3   Title Pt will demo BLE strength at least 4+/5    Baseline 5/5 BLE    Time 8    Period Weeks    Status Achieved      PT LONG TERM GOAL #4   Title Pt will improve BERG by at least 10  points to indicate decreased falls risk    Baseline 25/56: no signficant changes in Berg Score since time of eval    Time 8    Period Weeks    Status On-going      PT LONG TERM GOAL #5  Title Pt will ambulate and complete direction changes with least restrictive assistive device safely without LOB to facilitate improved safety with community negotiation    Baseline pt requires supervision-CGA when ambulating with RW    Time 8    Period Weeks    Status On-going                 Plan - 04/06/21 1523    Clinical Impression Statement Pt demos increased carryover this rx for seated  LE ex's; able to remember form with little cuing. Did require postural/tactile cues for seated shoulder/back ex's. Worked on some core strengthening along with maintaining upright posture. Continue to progress to tolerance with plan to d/c at last scheduled rx.    PT Treatment/Interventions ADLs/Self Care Home Management;Aquatic Therapy;DME Instruction;Gait training;Stair training;Functional mobility training;Therapeutic activities;Therapeutic exercise;Balance training;Neuromuscular re-education;Patient/family education;Vestibular;Visual/perceptual remediation/compensation;Manual techniques;Taping    PT Next Visit Plan Progressive TE and balance/neurore-ed exercises as tolerated. Frequent cues for safety. Standing balance/vestibular/multi-sensory balance activities, postural exercises, dynamic balance/ balance recovery.    Consulted and Agree with Plan of Care Patient;Family member/caregiver    Family Member Consulted husband           Patient will benefit from skilled therapeutic intervention in order to improve the following deficits and impairments:  Abnormal gait,Decreased balance,Decreased coordination,Decreased strength,Difficulty walking,Impaired tone,Impaired UE functional use,Impaired vision/preception,Postural dysfunction,Decreased mobility,Decreased safety awareness,Decreased endurance  Visit Diagnosis: Muscle weakness (generalized)  Other lack of coordination  Ataxic gait  Unsteadiness on feet     Problem List Patient Active Problem List   Diagnosis Date Noted  . 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 Dysphagia 03/14/2021  . Dysphonia 03/14/2021  . Dementia arising in the senium and presenium ([place]) 03/14/2021  . PAF (paroxysmal atrial fibrillation) ([place]) 12/30/2020  . Heme + stool 12/27/2020  . Hyponatremia 12/27/2020  . Anemia 12/27/2020  .  Protein-calorie malnutrition, severe 12/16/2020  . Encephalopathy 12/10/2020  . Grand mal seizure ([place]) 12/02/2020  . COPD (chronic obstructive pulmonary disease) ([place]) 12/02/2020  . Malnutrition of moderate degree 11/28/2020  . Acute encephalopathy   . Acute respiratory failure with hypoxia ([place])   . Aspiration into airway   . Cardiac arrest ([place])   . Groin mass 08/20/2020  . Falls infrequently 03/27/2019  . Sensory neuropathy 02/01/2019  . 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 Cerebellar ataxia ([place]) 08/23/2018  . Sensory polyneuropathy 08/23/2018  . Incontinence of feces   . Visual disturbances 02/06/2017  . Essential hypertension 11/07/2016  . Hyperlipidemia 04/24/2015  . Dysuria 02/11/2015  . Acute cystitis without hematuria 02/11/2015  . Chest pain 02/06/2015  . Left inguinal hernia 08/02/2013  . Gait abnormality 05/14/2013  . Gait disorder   . Nystagmus, end-position   . Routine health maintenance 12/13/2011  . LEG CRAMPS, NOCTURNAL 12/07/2010  . CALLUS, TOE 03/19/2010  . hip pain 03/19/2010  . ATAXIA 11/17/2008  . CERVICAL RADICULOPATHY, RIGHT 10/21/2008  . DIVERTICULOSIS OF COLON 08/14/2008  . RENAL CYST 08/14/2008  . ANEMIA-IRON DEFICIENCY 06/16/2007  . Asthma 06/16/2007  . GERD 06/16/2007  . OSTEOPENIA 06/16/2007   Amador Cunas, PT, DPT [facility] Jase Himmelberger 04/06/2021, 3:25 PM  [place]. [place], [place], [address] Phone: [phone]   Fax:  [phone]  Name: HENRIETTA CIESLEWICZ MRN: [id number] Date of Birth: 28-Dec-1940

## 2021-04-13 ENCOUNTER — Ambulatory Visit: Payer: Medicare Other | Admitting: Physical Therapy

## 2021-04-13 ENCOUNTER — Other Ambulatory Visit: Payer: Self-pay

## 2021-04-13 ENCOUNTER — Encounter: Payer: Self-pay | Admitting: Physical Therapy

## 2021-04-13 DIAGNOSIS — R270 Ataxia, unspecified: Secondary | ICD-10-CM | POA: Diagnosis not present

## 2021-04-13 DIAGNOSIS — R278 Other lack of coordination: Secondary | ICD-10-CM | POA: Diagnosis not present

## 2021-04-13 DIAGNOSIS — R260 Ataxic gait: Secondary | ICD-10-CM

## 2021-04-13 DIAGNOSIS — M6281 Muscle weakness (generalized): Secondary | ICD-10-CM

## 2021-04-13 DIAGNOSIS — R41840 Attention and concentration deficit: Secondary | ICD-10-CM | POA: Diagnosis not present

## 2021-04-13 DIAGNOSIS — R2681 Unsteadiness on feet: Secondary | ICD-10-CM | POA: Diagnosis not present

## 2021-04-13 NOTE — Therapy (Signed)
[address] [place], [place], [address] Phone: [phone]   Fax:  [phone]  Physical Therapy Treatment  Patient Details  Name: Kristine Francis MRN: [id number] Date of Birth: [birth year] Referring Provider (PT): Cathlyn Parsons, PA-C   Encounter Date: 04/13/2021   PT End of Session - 04/13/21 1529    Visit Number 17    Date for PT Re-Evaluation 05/07/21    PT Start Time 1444    PT Stop Time 1526    PT Time Calculation (min) 42 min    Activity Tolerance Patient tolerated treatment well    Behavior During Therapy [facility] for tasks assessed/performed           Past Medical History:  Diagnosis Date  . Allergy   . Asthma    sees Dr. Tiajuana Amass - as child per pt  . At high risk for falls    unstable gait   . Ataxia   . Cataract    removed bilaterally   . Diverticulosis   . Emphysema   . Gait abnormality 05/14/2013   Ms.Amundson a patient of Dr. Linda Hedges presented first interpreter thousand 13 with a gait dysfunction, she also had an episodic confusion and Loss device. Exam found a mild nystagmus and she was referred to ophthalmology on 03-2812, brain MRI was normal nystagmus was a secondary diagnosis to vestibulitis, ear nose and throat has followed and had seen the patient. At the time of the nystagmus the patie  . GERD (gastroesophageal reflux disease)   . Hepatic steatosis   . Hyperlipidemia    on atorvastatin   . Hypertension    on losartan   . Iron deficiency anemia, unspecified   . Kidney cysts 11/16/12   small cyst on left  . Nystagmus, end-position   . Osteopenia   . Renal cyst   . Stroke ([place]) 01/2017   mild     Past Surgical History:  Procedure Laterality Date  . ANAL RECTAL MANOMETRY N/A 12/20/2017   Procedure: ANO RECTAL MANOMETRY;  Surgeon: Mauri Pole, MD;  Location: [facility];  Service: Endoscopy;  Laterality: N/A;  . BREAST BIOPSY Bilateral 1970's  . CATARACT EXTRACTION  Bilateral LT:02/14/13,RT:02/21/13  . CERVICAL SPINE SURGERY  2010  . COLONOSCOPY  03-12-14   per Dr. Olevia Perches, diverticulosis only, repeat 5 yrs   . EYE SURGERY Bilateral 01/2013&02/2013   left then right  . HERNIA REPAIR  Oct '14-left, remote-right   years ago right; left done '14  . SKIN CANCER EXCISION  05/13/13   FACE, basal cell  . TONSILLECTOMY      There were no vitals filed for this visit.   Subjective Assessment - 04/13/21 1459    Subjective Pt states no new changes since last rx.    Currently in Pain? No/denies                             [facility] Adult PT Treatment/Exercise - 04/13/21 0001      Ambulation/Gait   Gait Comments gait with RW x100 ft with cues for RW maintained close to body      Knee/Hip Exercises: Aerobic   Nustep L5 x 6 min    Other Aerobic UBE L 1.5 x3 min each      Knee/Hip Exercises: Seated   Long Arc Quad Both;2 sets;10 reps    Long Arc Quad Weight 3 lbs.    [employer]  Squeeze 1x15 3 sec hold    Clamshell with TheraBand Red   x15   Other Seated Knee/Hip Exercises seated postural ex's including 3# chest press, red tb rows/extensions    Marching Both;10 reps;2 sets    Marching Weights 3 lbs.    Hamstring Curl Both;2 sets;10 reps    Hamstring Limitations red tb                    PT Short Term Goals - 01/13/21 1511      PT SHORT TERM GOAL #1   Title Pt and caregiver/spouse independent with initiating initial HEP    Baseline spouse reports difficult to get pt to maintain attention for HEP; educated on providing visual cuing    Time 2    Period Weeks    Status On-going    Target Date 01/18/21      PT SHORT TERM GOAL #2   Title Pt will improve five time sit to stand from mat, no UE support to </= 15 seconds but with improved anterior weight shift and decreased posterior LOB    Baseline able to perform with no UE support but does demo lack of anterior weight shift and occasional posterior LOB    Time 4    Period Weeks     Status On-going    Target Date 02/01/21      PT SHORT TERM GOAL #3   Title Pt will demo improved static standing balance to at least 1 minute with close supervision and minimal sway, without LOB    Baseline ~20 sec this rx    Time 4    Period Weeks    Status On-going    Target Date 02/01/21      PT SHORT TERM GOAL #4   Title Pt and caregiver will demo good understanding of falls prevention and safe functional mobility within the home to prevent falls    Time 2    Period Weeks    Status On-going    Target Date 01/18/21             PT Long Term Goals - 03/09/21 1601      PT LONG TERM GOAL #1   Title Pt will demonstrate independence with final HEP    Time 8    Period Weeks    Status On-going      PT LONG TERM GOAL #2   Title Pt will improve five time sit to stand without use of UE to </= 13 seconds and no posterior LOB    Baseline 20.74  sec; truncal ataxia but no LOB. Mod cuing for sequencing with STS to avoid posterior LOB    Time 8    Period Weeks    Status Partially Met      PT LONG TERM GOAL #3   Title Pt will demo BLE strength at least 4+/5    Baseline 5/5 BLE    Time 8    Period Weeks    Status Achieved      PT LONG TERM GOAL #4   Title Pt will improve BERG by at least 10  points to indicate decreased falls risk    Baseline 25/56: no signficant changes in Berg Score since time of eval    Time 8    Period Weeks    Status On-going      PT LONG TERM GOAL #5   Title Pt will ambulate and complete direction changes with least restrictive assistive device safely  without LOB to facilitate improved safety with community negotiation    Baseline pt requires supervision-CGA when ambulating with RW    Time 8    Period Weeks    Status On-going                 Plan - 04/13/21 1529    Clinical Impression Statement Continued work today on postural ex's and core strengthening. All seated ex's done without back support and cues for maintaining upright posture.  Plan for discharge with updated HEP at next rx.    PT Treatment/Interventions ADLs/Self Care Home Management;Aquatic Therapy;DME Instruction;Gait training;Stair training;Functional mobility training;Therapeutic activities;Therapeutic exercise;Balance training;Neuromuscular re-education;Patient/family education;Vestibular;Visual/perceptual remediation/compensation;Manual techniques;Taping    PT Next Visit Plan plan for d/c with updated HEP at next rx    Consulted and Agree with Plan of Care Patient;Family member/caregiver    Family Member Consulted husband           Patient will benefit from skilled therapeutic intervention in order to improve the following deficits and impairments:  Abnormal gait,Decreased balance,Decreased coordination,Decreased strength,Difficulty walking,Impaired tone,Impaired UE functional use,Impaired vision/preception,Postural dysfunction,Decreased mobility,Decreased safety awareness,Decreased endurance  Visit Diagnosis: Muscle weakness (generalized)  Other lack of coordination  Ataxic gait  Unsteadiness on feet     Problem List Patient Active Problem List   Diagnosis Date Noted  . Localization-related idiopathic epilepsy and epileptic syndromes with seizures of localized onset, not intractable, with status epilepticus ([place]) 03/14/2021  . Dysphagia 03/14/2021  . Dysphonia 03/14/2021  . Dementia arising in the senium and presenium ([place]) 03/14/2021  . PAF (paroxysmal atrial fibrillation) ([place]) 12/30/2020  . Heme + stool 12/27/2020  . Hyponatremia 12/27/2020  . Anemia 12/27/2020  . Protein-calorie malnutrition, severe 12/16/2020  . Encephalopathy 12/10/2020  . Grand mal seizure ([place]) 12/02/2020  . COPD (chronic obstructive pulmonary disease) ([place]) 12/02/2020  . Malnutrition of moderate degree 11/28/2020  . Acute encephalopathy   . Acute respiratory failure with hypoxia ([place])   . Aspiration into airway   . Cardiac arrest ([place])   . Groin mass 08/20/2020   . Falls infrequently 03/27/2019  . Sensory neuropathy 02/01/2019  . 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 Cerebellar ataxia ([place]) 08/23/2018  . Sensory polyneuropathy 08/23/2018  . Incontinence of feces   . Visual disturbances 02/06/2017  . Essential hypertension 11/07/2016  . Hyperlipidemia 04/24/2015  . Dysuria 02/11/2015  . Acute cystitis without hematuria 02/11/2015  . Chest pain 02/06/2015  . Left inguinal hernia 08/02/2013  . Gait abnormality 05/14/2013  . Gait disorder   . Nystagmus, end-position   . Routine health maintenance 12/13/2011  . LEG CRAMPS, NOCTURNAL 12/07/2010  . CALLUS, TOE 03/19/2010  . hip pain 03/19/2010  . ATAXIA 11/17/2008  . CERVICAL RADICULOPATHY, RIGHT 10/21/2008  . DIVERTICULOSIS OF COLON 08/14/2008  . RENAL CYST 08/14/2008  . ANEMIA-IRON DEFICIENCY 06/16/2007  . Asthma 06/16/2007  . GERD 06/16/2007  . OSTEOPENIA 06/16/2007   Amador Cunas, PT, DPT Donald Prose Lorraine Cimmino 04/13/2021, 3:31 PM  [address] [place], [place], [address] Phone: [phone]   Fax:  [phone]  Name: Kristine Francis MRN: [id number] Date of Birth: [birth year]

## 2021-04-20 ENCOUNTER — Ambulatory Visit: Payer: Medicare Other | Admitting: Physical Therapy

## 2021-04-22 ENCOUNTER — Telehealth: Payer: Self-pay

## 2021-04-22 DIAGNOSIS — Z780 Asymptomatic menopausal state: Secondary | ICD-10-CM

## 2021-04-22 NOTE — Telephone Encounter (Signed)
Pt Bone density order placed

## 2021-04-26 ENCOUNTER — Ambulatory Visit (INDEPENDENT_AMBULATORY_CARE_PROVIDER_SITE_OTHER)
Admission: RE | Admit: 2021-04-26 | Discharge: 2021-04-26 | Disposition: A | Discharge status: Home / Self Care | Payer: Medicare Other | Source: Ambulatory Visit | Attending: Family Medicine | Admitting: Family Medicine

## 2021-04-26 ENCOUNTER — Other Ambulatory Visit: Payer: Self-pay

## 2021-04-26 DIAGNOSIS — Z780 Asymptomatic menopausal state: Secondary | ICD-10-CM

## 2021-05-03 ENCOUNTER — Other Ambulatory Visit: Payer: Self-pay

## 2021-05-03 ENCOUNTER — Ambulatory Visit: Payer: Medicare Other | Attending: Physician Assistant | Admitting: Physical Therapy

## 2021-05-03 ENCOUNTER — Encounter: Payer: Self-pay | Admitting: Physical Therapy

## 2021-05-03 DIAGNOSIS — R2681 Unsteadiness on feet: Secondary | ICD-10-CM | POA: Insufficient documentation

## 2021-05-03 DIAGNOSIS — R260 Ataxic gait: Secondary | ICD-10-CM | POA: Insufficient documentation

## 2021-05-03 DIAGNOSIS — M6281 Muscle weakness (generalized): Secondary | ICD-10-CM | POA: Insufficient documentation

## 2021-05-03 DIAGNOSIS — R278 Other lack of coordination: Secondary | ICD-10-CM | POA: Insufficient documentation

## 2021-05-03 NOTE — Therapy (Signed)
[address] [place], [place], [address] Phone: [phone]   Fax:  [phone]  Physical Therapy Treatment PHYSICAL THERAPY DISCHARGE SUMMARY  Visits from Start of Care: 18   Patient agrees to discharge. Patient goals were partially met. Patient is being discharged due to maximized rehab potential.   Patient Details  Name: Kristine Francis MRN: [id number] Date of Birth: 1941-08-18 Referring Provider (PT): Cathlyn Parsons, PA-C   Encounter Date: 05/03/2021   PT End of Session - 05/03/21 1212     Visit Number 18    Date for PT Re-Evaluation 05/07/21    PT Start Time 1137    PT Stop Time 1213    PT Time Calculation (min) 36 min    Activity Tolerance Patient tolerated treatment well    Behavior During Therapy [facility] for tasks assessed/performed             Past Medical History:  Diagnosis Date   Allergy    Asthma    sees Dr. Tiajuana Amass - as child per pt   At high risk for falls    unstable gait    Ataxia    Cataract    removed bilaterally    Diverticulosis    Emphysema    Gait abnormality 05/14/2013   Ms.Wyke a patient of Dr. Linda Hedges presented first interpreter thousand 13 with a gait dysfunction, she also had an episodic confusion and Loss device. Exam found a mild nystagmus and she was referred to ophthalmology on 03-2812, brain MRI was normal nystagmus was a secondary diagnosis to vestibulitis, ear nose and throat has followed and had seen the patient. At the time of the nystagmus the patie   GERD (gastroesophageal reflux disease)    Hepatic steatosis    Hyperlipidemia    on atorvastatin    Hypertension    on losartan    Iron deficiency anemia, unspecified    Kidney cysts 11/16/12   small cyst on left   Nystagmus, end-position    Osteopenia    Renal cyst    Stroke ([place]) 01/2017   mild     Past Surgical History:  Procedure Laterality Date   ANAL RECTAL MANOMETRY N/A 12/20/2017   Procedure:  ANO RECTAL MANOMETRY;  Surgeon: Mauri Pole, MD;  Location: [facility];  Service: Endoscopy;  Laterality: N/A;   BREAST BIOPSY Bilateral 1970's   CATARACT EXTRACTION Bilateral LT:02/14/13,RT:02/21/13   CERVICAL SPINE SURGERY  2010   COLONOSCOPY  03-12-14   per Dr. Olevia Perches, diverticulosis only, repeat 5 yrs    EYE SURGERY Bilateral 01/2013&02/2013   left then right   HERNIA REPAIR  Oct '14-left, remote-right   years ago right; left done '14   SKIN CANCER EXCISION  05/13/13   FACE, basal cell   TONSILLECTOMY      There were no vitals filed for this visit.   Subjective Assessment - 05/03/21 1151     Subjective Pt states no new changes since last rx.    Currently in Pain? No/denies                               [facility] Adult PT Treatment/Exercise - 05/03/21 0001       Knee/Hip Exercises: Aerobic   Nustep L5 x 8 min      Knee/Hip Exercises: Seated   Long Arc Quad Both;2 sets;10 reps    Long Arc Quad Weight 3  lbs.    [employer] Squeeze 1x15 3 sec hold    Clamshell with TheraBand Green   x15   Other Seated Knee/Hip Exercises seated postural ex's including 3# chest press    Marching Both;10 reps;2 sets    Marching Weights 3 lbs.    Hamstring Curl Both;2 sets;10 reps    Hamstring Limitations green tb                      PT Short Term Goals - 01/13/21 1511       PT SHORT TERM GOAL #1   Title Pt and caregiver/spouse independent with initiating initial HEP    Baseline spouse reports difficult to get pt to maintain attention for HEP; educated on providing visual cuing    Time 2    Period Weeks    Status On-going    Target Date 01/18/21      PT SHORT TERM GOAL #2   Title Pt will improve five time sit to stand from mat, no UE support to </= 15 seconds but with improved anterior weight shift and decreased posterior LOB    Baseline able to perform with no UE support but does demo lack of anterior weight shift and occasional posterior LOB    Time 4     Period Weeks    Status On-going    Target Date 02/01/21      PT SHORT TERM GOAL #3   Title Pt will demo improved static standing balance to at least 1 minute with close supervision and minimal sway, without LOB    Baseline ~20 sec this rx    Time 4    Period Weeks    Status On-going    Target Date 02/01/21      PT SHORT TERM GOAL #4   Title Pt and caregiver will demo good understanding of falls prevention and safe functional mobility within the home to prevent falls    Time 2    Period Weeks    Status On-going    Target Date 01/18/21               PT Long Term Goals - 05/03/21 1200       PT LONG TERM GOAL #1   Title Pt will demonstrate independence with final HEP    Baseline pt will need assistance from spouse to continue with HEP    Time 8    Period Weeks    Status Partially Met      PT LONG TERM GOAL #2   Title Pt will improve five time sit to stand without use of UE to </= 13 seconds and no posterior LOB    Baseline 20.74  sec; truncal ataxia but no LOB. Mod cuing for sequencing with STS to avoid posterior LOB    Time 8    Period Weeks    Status Partially Met      PT LONG TERM GOAL #3   Title Pt will demo BLE strength at least 4+/5    Baseline 5/5 BLE    Time 8    Period Weeks    Status Achieved      PT LONG TERM GOAL #4   Title Pt will improve BERG by at least 10  points to indicate decreased falls risk    Baseline 25/56: no signficant changes in Berg Score since time of eval    Time 8    Period Weeks    Status On-going  PT LONG TERM GOAL #5   Title Pt will ambulate and complete direction changes with least restrictive assistive device safely without LOB to facilitate improved safety with community negotiation    Baseline pt requires supervision-CGA when ambulating with RW    Time 8    Period Weeks    Status On-going                   Plan - 05/03/21 1213     Clinical Impression Statement Pt arrived ~7 min late, so abbreviated  session this rx. Pt recommended for discharge today with updated HEP. She has made progress in functional strength and demos mild improvements in gait pattern with increased stride and stability. Still demos limitations in balance and cognitive function impairing awareness of balance/functional deficits. Educated pt and spouse on continuation of HEP along with when to return if signs/symptoms worsen or recur. Pt and spouse VU and agreement.    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 seizures of localized onset, not intractable, with status epilepticus ([place]) 03/14/2021   Dysphagia 03/14/2021   Dysphonia 03/14/2021   Dementia arising in the senium and presenium ([place]) 03/14/2021   PAF (paroxysmal atrial fibrillation) ([place]) 12/30/2020   Heme + stool 12/27/2020   Hyponatremia 12/27/2020   Anemia 12/27/2020   Protein-calorie malnutrition, severe 12/16/2020   Encephalopathy 12/10/2020   Grand mal seizure ([place]) 12/02/2020   COPD (chronic obstructive pulmonary disease) ([place]) 12/02/2020   Malnutrition of moderate degree 11/28/2020   Acute encephalopathy    Acute respiratory failure with hypoxia ([facility])    Aspiration into airway    Cardiac arrest ([place])    Groin mass 08/20/2020   Falls infrequently 03/27/2019   Sensory neuropathy 02/01/2019   Luetscher's syndrome 08/23/2018   Cerebellar ataxia ([place]) 08/23/2018   Sensory polyneuropathy 08/23/2018   Incontinence of feces    Visual disturbances 02/06/2017    Essential hypertension 11/07/2016   Hyperlipidemia 04/24/2015   Dysuria 02/11/2015   Acute cystitis without hematuria 02/11/2015   Chest pain 02/06/2015   Left inguinal hernia 08/02/2013   Gait abnormality 05/14/2013   Gait disorder    Nystagmus, end-position    Routine health maintenance 12/13/2011   LEG CRAMPS, NOCTURNAL 12/07/2010   CALLUS, TOE 03/19/2010   hip pain 03/19/2010   ATAXIA 11/17/2008   CERVICAL RADICULOPATHY, RIGHT 10/21/2008   DIVERTICULOSIS OF COLON 08/14/2008   RENAL CYST 08/14/2008   ANEMIA-IRON DEFICIENCY 06/16/2007   Asthma 06/16/2007   GERD 06/16/2007   OSTEOPENIA 06/16/2007   Amador Cunas, PT, DPT Donald Prose Eltha Tingley 05/03/2021, 12:22 PM  [address] [place], [place], [address] Phone: [phone]   Fax:  [phone]  Name: Kristine Francis MRN: [id number] Date of Birth: 1941/03/21

## 2021-05-07 DIAGNOSIS — Z1231 Encounter for screening mammogram for malignant neoplasm of breast: Secondary | ICD-10-CM | POA: Diagnosis not present

## 2021-05-07 LAB — HM MAMMOGRAPHY

## 2021-05-11 ENCOUNTER — Ambulatory Visit (INDEPENDENT_AMBULATORY_CARE_PROVIDER_SITE_OTHER): Payer: Medicare Other | Admitting: Podiatry

## 2021-05-11 ENCOUNTER — Other Ambulatory Visit: Payer: Self-pay

## 2021-05-11 DIAGNOSIS — Z7901 Long term (current) use of anticoagulants: Secondary | ICD-10-CM | POA: Diagnosis not present

## 2021-05-11 DIAGNOSIS — M2031 Hallux varus (acquired), right foot: Secondary | ICD-10-CM | POA: Diagnosis not present

## 2021-05-11 DIAGNOSIS — L84 Corns and callosities: Secondary | ICD-10-CM | POA: Diagnosis not present

## 2021-05-11 DIAGNOSIS — R52 Pain, unspecified: Secondary | ICD-10-CM

## 2021-05-13 ENCOUNTER — Encounter: Payer: Self-pay | Admitting: Family Medicine

## 2021-05-13 ENCOUNTER — Encounter: Payer: Self-pay | Admitting: Podiatry

## 2021-05-13 NOTE — Progress Notes (Signed)
  Subjective:  Patient ID: Kristine Francis, female    DOB: 03-27-1941,  MRN: [id number]  Chief Complaint  Patient presents with   Nail Problem    Bilateral nail trim 1-5.    Callouses    Callus of bottom right foot near 1st toe.     80 y.o. female returns with the above complaint. History confirmed with patient.  Here with her husband.  She has been using the urea cream and pumice stone that he helps her with that has been helpful.  Objective:  Physical Exam: warm, good capillary refill, no trophic changes or ulcerative lesions, normal DP and PT pulses and reduced incision light touch bilateral feet.  Bilateral preulcerative callus submet 1, improved since last visit in character and size   Assessment:   1. Pain due to onychomycosis of toenails of both feet   2. Callus of foot      Plan:  Patient was evaluated and treated and all questions answered.  All symptomatic hyperkeratoses were safely debrided with a sterile #15 blade to patient's level of comfort without incident. We discussed preventative and palliative care of these lesions including supportive and accommodative shoegear, padding, prefabricated and custom molded accommodative orthoses, use of a pumice stone and lotions/creams daily.  Recommended they continue urea cream and pumice stone daily     Return in about 3 months (around 08/11/2021) for callus care.

## 2021-06-04 ENCOUNTER — Other Ambulatory Visit: Payer: Self-pay | Admitting: Family Medicine

## 2021-06-08 ENCOUNTER — Ambulatory Visit (INDEPENDENT_AMBULATORY_CARE_PROVIDER_SITE_OTHER): Payer: Medicare Other | Admitting: Adult Health

## 2021-06-08 ENCOUNTER — Encounter: Payer: Self-pay | Admitting: Adult Health

## 2021-06-08 ENCOUNTER — Other Ambulatory Visit: Payer: Self-pay

## 2021-06-08 VITALS — BP 123/73 | HR 57 | Ht 60.0 in | Wt 92.8 lb

## 2021-06-08 DIAGNOSIS — R269 Unspecified abnormalities of gait and mobility: Secondary | ICD-10-CM

## 2021-06-08 DIAGNOSIS — Z20822 Contact with and (suspected) exposure to covid-19: Secondary | ICD-10-CM | POA: Diagnosis not present

## 2021-06-08 DIAGNOSIS — G40001 Localization-related (focal) (partial) idiopathic epilepsy and epileptic syndromes with seizures of localized onset, not intractable, with status epilepticus: Secondary | ICD-10-CM | POA: Diagnosis not present

## 2021-06-08 DIAGNOSIS — I63139 Cerebral infarction due to embolism of unspecified carotid artery: Secondary | ICD-10-CM

## 2021-06-08 DIAGNOSIS — R413 Other amnesia: Secondary | ICD-10-CM | POA: Diagnosis not present

## 2021-06-08 DIAGNOSIS — R270 Ataxia, unspecified: Secondary | ICD-10-CM | POA: Diagnosis not present

## 2021-06-08 DIAGNOSIS — Z8673 Personal history of transient ischemic attack (TIA), and cerebral infarction without residual deficits: Secondary | ICD-10-CM | POA: Diagnosis not present

## 2021-06-08 NOTE — Patient Instructions (Signed)
Referral for home health PT Continue Keppra  If your symptoms worsen or you develop new symptoms please let us know.

## 2021-06-08 NOTE — Progress Notes (Signed)
PATIENT: Kristine Francis DOB: [birth year]  REASON FOR VISIT: follow up HISTORY FROM: patient PRIMARY NEUROLOGIST: Dr. [name]  HISTORY OF PRESENT ILLNESS: Today 06/08/21:  Kristine Francis is a 80 year old female with a history of stroke and seizures.  She returns today for follow-up.  She remains on Keppra 500 mg twice a day.  She denies any seizure events.  She is able to complete all ADLs independently.  She lives with her husband.  Reports that she is sometimes forgetful but overall feels that her memory has remained stable.  Her husband now manages all the finances.  She manages her appointments and medications however her husband does fill the pillbox for her.  She continues to have ongoing trouble with her gait.  Has been diagnosed in the past by [facility] with cerebellar ataxia.  She reports that she typically has falls inside her home.  She does not use a walker inside her home.  Fortunately she has not had any severe injuries.  She does use a walker or wheelchair outside of the home.  She returns today for an evaluation.  HISTORY (Copied from Dr.Dohmeier's note) Kristine Francis today who prefers to go by the name Kristine Francis.  She is a 80 year old female was most recently neurology patient with Dr. Carles Collet and [facility] neurology.  On November 25, 2020 she was admitted to hospital as a code stroke.  She had been at her baseline, she was in the presence of her grand children at home when the granddaughter heard a loud scream and found the patient leaning over down on the table.  Husband returned soon after to find her leaning on the table unable to move.  EMS was called she was brought in as a code stroke she had a right gaze deviation followed for one-step commands only and does seem not to have any word output.  In the emergency room she had developed copious amounts of secretions and she had a generalized tonic-clonic seizure in front of the consulting physician.  Was an ictal cry her right hand went up  tonically gaze was deviated to the right and then she had full body rhythmic generalized tonic-clonic convulsions.  She went bradycardic into her 40s she was not intubated given 2 mg of Ativan and loaded with 1000 mg of Keppra IV a CT angio was obtained CT head was negative for territorial infarct she was premedicated with Solu-Medrol Benadryl and then a CT angiogram with contrast was obtained..  She had no previous indication of a CNS infection, there was no evidence of any kind of unusual headache, confusion or delirium.  No prescribed his prior history of seizures.  TPA was not given as the patient did not show signs of a stroke by CT.  The patient had been seen in 2018 for stroke with follow-up with Dr. Guillermina [place].  MRI of the brain and a EEG were also ordered EEG was a little impaired because of the sedation.  So there were no seizures or epileptiform discharges noted on the EEG as this was performed while the patient was already under sedate of medications.  The MRI with and without contrast had not been obtained on 5 January cerebrum remote and only lacunar infarct in her right basal ganglia small vessel ischemia no acute infarction hemorrhage hydrocephalus also was noted.  So the stroke was actually not found.  She then   Underwent some rehab and therapy with speech pathology especially because her swallowing and her voice had  changed.  Speech therapy was recommended for a duration of 2 weeks twice a week and is now completed.  She still is very hoarse laboratories show that the patient was quite anemic in January and that she had low sodium levels down 232 mmol/L, her calcium levels were 8.2 also low but should have meanwhile been corrected.  She was found to have GI bleed with heme positive stools.  Dr. 5 her family doctor saw the patient and stated that he had no sign of further seizure activity.  She had remained encephalopathic for several weeks until he saw her on 30 December 2020.     She remains  dysphonic and dysphagic- no more seizure activity.          Kristine Francis or Caucasian female with a recent stroke, and  has a past medical history of Allergy, Asthma, At high risk for falls, Ataxia, Cataract, Diverticulosis, Emphysema, Gait abnormality (05/14/2013), GERD (gastroesophageal reflux disease), Hepatic steatosis, Hyperlipidemia, Hypertension, Iron deficiency anemia, unspecified, Kidney cysts (11/16/12), Nystagmus, end-position, Osteopenia, Renal cyst, and Stroke ([place]) (01/2017) seen by Dr. Carles Collet through 2018 .Marland Kitchen        Social history: Patient lives in a household with spouse of 63 year- adult children and many grandchildren. Reports memory loss.  Tobacco use- none .  ETOH use; none , Caffeine intake in form of Coffee( 1-2) Soda( /) Tea (/) or energy drinks. RLS, longstanding gait problems.     REVIEW OF SYSTEMS: Out of a complete 14 system review of symptoms, the patient complains only of the following symptoms, and all other reviewed systems are negative.  ALLERGIES: Allergies  Allergen Reactions   Contrast Media [Iodinated Diagnostic Agents] Other (See Comments)    Congestion .Marland KitchenMarland Kitchenpatient stated it irritated her eyes and voice   Iohexol Other (See Comments)    Gi upset   Lisinopril Cough    HOME MEDICATIONS: Outpatient Medications Prior to Visit  Medication Sig Dispense Refill   acetaminophen (TYLENOL) 325 MG tablet Take 1-2 tablets (325-650 mg total) by mouth every 4 (four) hours as needed for mild pain.     amLODipine (NORVASC) 2.5 MG tablet Take 1 tablet (2.5 mg total) by mouth daily. 90 tablet 0   atorvastatin (LIPITOR) 10 MG tablet Take 1 tablet (10 mg total) by mouth daily. 90 tablet 0   ELIQUIS 5 MG TABS tablet TAKE 1 TABLET TWICE A DAY 180 tablet 3   EPINEPHrine 0.3 mg/0.3 mL IJ SOAJ injection Inject 0.3 mg into the muscle as needed for anaphylaxis.     furosemide (LASIX) 20 MG tablet (Prior Auth: Rx CXK#:[id number])     ibuprofen (ADVIL) 800 MG tablet       levETIRAcetam (KEPPRA) 500 MG tablet Take 1 tablet (500 mg total) by mouth 2 (two) times daily. 180 tablet 0   losartan (COZAAR) 50 MG tablet (Prior Auth: Rx YOV#:[id number])     melatonin 3 MG TABS tablet Take 1 tablet (3 mg total) by mouth at bedtime. 30 tablet 11   metoprolol tartrate (LOPRESSOR) 25 MG tablet Take 1 tablet (25 mg total) by mouth 2 (two) times daily. 180 tablet 0   Multiple Vitamin (MULTIVITAMIN WITH MINERALS) TABS tablet Take 1 tablet by mouth daily.     pantoprazole (PROTONIX) 20 MG tablet Take 1 tablet (20 mg total) by mouth daily. 90 tablet 0   STARCH-MALTO DEXTRIN (THICK-IT) PACK Take 1 each by mouth 4 (four) times daily as needed. Thicken liquids to honey consistency 200  each 0   No facility-administered medications prior to visit.    PAST MEDICAL HISTORY: Past Medical History:  Diagnosis Date   Allergy    Asthma    sees Dr. Tiajuana Amass - as child per pt   At high risk for falls    unstable gait    Ataxia    Cataract    removed bilaterally    Diverticulosis    Emphysema    Gait abnormality 05/14/2013   Kristine Francis a patient of Dr. Linda Hedges presented first interpreter thousand 13 with a gait dysfunction, she also had an episodic confusion and Loss device. Exam found a mild nystagmus and she was referred to ophthalmology on 03-2812, brain MRI was normal nystagmus was a secondary diagnosis to vestibulitis, ear nose and throat has followed and had seen the patient. At the time of the nystagmus the patie   GERD (gastroesophageal reflux disease)    Hepatic steatosis    Hyperlipidemia    on atorvastatin    Hypertension    on losartan    Iron deficiency anemia, unspecified    Kidney cysts 11/16/12   small cyst on left   Nystagmus, end-position    Osteopenia    Renal cyst    Stroke ([place]) 01/2017   mild     PAST SURGICAL HISTORY: Past Surgical History:  Procedure Laterality Date   ANAL RECTAL MANOMETRY N/A 12/20/2017   Procedure: ANO RECTAL MANOMETRY;   Surgeon: Mauri Pole, MD;  Location: [facility];  Service: Endoscopy;  Laterality: N/A;   BREAST BIOPSY Bilateral 1970's   CATARACT EXTRACTION Bilateral LT:02/14/13,RT:02/21/13   CERVICAL SPINE SURGERY  2010   COLONOSCOPY  03-12-14   per Dr. Olevia Perches, diverticulosis only, repeat 5 yrs    EYE SURGERY Bilateral 01/2013&02/2013   left then right   HERNIA REPAIR  Oct '14-left, remote-right   years ago right; left done '14   SKIN CANCER EXCISION  05/13/13   FACE, basal cell   TONSILLECTOMY      FAMILY HISTORY: Family History  Problem Relation Age of Onset   Colon cancer Father    Coronary artery disease Father    Heart block Father    Pancreatic cancer Mother    Throat cancer Mother    Colon cancer Paternal Grandmother    Colon cancer Paternal Aunt        x 2   Cancer - Lung Paternal Aunt    Breast cancer Paternal Aunt    Colon cancer Paternal Aunt    Ovarian cancer Maternal Aunt    Heart attack Paternal Grandfather    Esophageal cancer Other    Colon polyps Neg Hx    Rectal cancer Neg Hx    Stomach cancer Neg Hx     SOCIAL HISTORY: Social History   Socioeconomic History   Marital status: Married    Spouse name: Not on file   Number of children: 3   Years of education: 16   Highest education level: Not on file  Occupational History   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 married - 1965. 3 sons - '66, '69, '70. Sons in good health. Marriage in good health. Enjoys retirement - remains active.         Social Determinants of Health   Financial Resource  Strain: Low Risk    Difficulty of Paying Living Expenses: Not hard at all  Food Insecurity: No Food Insecurity   Worried About Charity fundraiser in the Last Year: Never true   [place] in the Last Year: Never true  Transportation Needs: No Transportation Needs   Lack of Transportation (Medical): No   Lack of Transportation (Non-Medical): No  Physical Activity: Inactive   Days of Exercise per Week: 0 days   Minutes of Exercise per Session: 0 min  Stress: No Stress Concern Present   Feeling of Stress : Not at all  Social Connections: Moderately Integrated   Frequency of Communication with Friends and Family: More than three times a week   Frequency of Social Gatherings with Friends and Family: More than three times a week   Attends Religious Services: 1 to 4 times per year   Active Member of [employer] or Organizations: No   Attends Archivist Meetings: Never   Marital Status: Married  Human resources officer Violence: Not At Risk   Fear of Current or Ex-Partner: No   Emotionally Abused: No   Physically Abused: No   Sexually Abused: No      PHYSICAL EXAM  Vitals:   06/08/21 0857  BP: 123/73  Pulse: (!) 57  Weight: 92 lb 12.8 oz (42.1 kg)  Height: 5' (1.524 m)   Body mass index is 18.12 kg/m.  MMSE - Mini Mental State Exam 06/08/2021 09/18/2018 09/18/2018  Not completed: - (No Data) (No Data)  Orientation to time 5 - -  Orientation to Place 5 - -  Registration 3 - -  Attention/ Calculation 5 - -  Recall 2 - -  Language- name 2 objects 2 - -  Language- repeat 1 - -  Language- follow 3 step command 3 - -  Language- read & follow direction 1 - -  Write a sentence 1 - -  Copy design 0 - -  Total score 28 - -     Generalized: Well developed, in no acute distress   Neurological examination  Mentation: Alert oriented to time, place, history taking. Follows all commands speech and language fluent Cranial nerve II-XII: Pupils were equal round reactive to light.  Extraocular movements were full but with nystagmus. visual field were full on confrontational test. Facial sensation and strength were normal. Uvula tongue midline. Head turning and shoulder shrug  were normal and symmetric. Motor: The motor testing reveals 5 over 5 strength of all 4 extremities. Good symmetric motor tone is noted throughout.  Sensory: Sensory testing is intact to soft touch on all 4 extremities. No evidence of extinction is noted.  Coordination: Cerebellar testing reveals ataxia with finger-nose-finger Gait and station: Patient requires assistance with standing.  Gait is unsteady.  Wide-based.  Tandem gait not attempted. Reflexes: Deep tendon reflexes are symmetric and normal bilaterally.   DIAGNOSTIC DATA (LABS, IMAGING, TESTING) - I reviewed patient records, labs, notes, testing and imaging myself where available.  Lab Results  Component Value Date   WBC 6.5 02/01/2021   HGB 10.1 (L) 02/01/2021   HCT 32.0 (L) 02/01/2021   MCV 83 02/01/2021   PLT 427 02/01/2021  Component Value Date/Time   NA 140 02/01/2021 1533   K 4.2 02/01/2021 1533   CL 102 02/01/2021 1533   CO2 23 02/01/2021 1533   GLUCOSE 106 (H) 02/01/2021 1533   GLUCOSE 96 12/30/2020 1350   BUN 18 02/01/2021 1533   CREATININE 0.59 02/01/2021 1533   CALCIUM 9.3 02/01/2021 1533   PROT 6.6 02/01/2021 1533   ALBUMIN 4.1 02/01/2021 1533   AST 17 02/01/2021 1533   ALT 12 02/01/2021 1533   ALKPHOS 89 02/01/2021 1533   BILITOT 0.4 02/01/2021 1533   GFRNONAA >60 12/21/2020 0509   GFRAA >60 01/26/2017 1640   Lab Results  Component Value Date   CHOL 202 (H) 04/15/2016   HDL 75.50 04/15/2016   LDLCALC 116 (H) 04/15/2016   LDLDIRECT 129.3 12/07/2010   TRIG 58 11/26/2020   CHOLHDL 3 04/15/2016   Lab Results  Component Value Date   HGBA1C 5.5 11/25/2020   Lab Results  Component Value Date   VITAMINB12 347 02/01/2021   Lab Results  Component Value Date   TSH 2.600 02/01/2021      ASSESSMENT  AND PLAN 80 y.o. year old female  has a past medical history of Allergy, Asthma, At high risk for falls, Ataxia, Cataract, Diverticulosis, Emphysema, Gait abnormality (05/14/2013), GERD (gastroesophageal reflux disease), Hepatic steatosis, Hyperlipidemia, Hypertension, Iron deficiency anemia, unspecified, Kidney cysts (11/16/12), Nystagmus, end-position, Osteopenia, Renal cyst, and Stroke ([place]) (01/2017). here with :  1.  History of stroke  -- Currently on Eliquis --Maintain good control of blood pressure with goal less than 130/90, LDL less than 70 and hemoglobin A1c less than 6.5% --Continue follow-up with PCP to manage risk factors  2.  Seizures  --Continue Keppra 500 mg twice a day  3.  Memory disturbance  --MMSE 28/30 --We will continue to monitor  4.  Abnormality of gait with ataxia  --Referral for home PT --Encouraged patient to use her walker inside the home.   Follow-up in 3 to 4 months with Dr. Mechele Claude, MSN, NP-C 06/08/2021, 9:42 AM [facility] [address], [place], [place] [address] [phone]

## 2021-06-10 ENCOUNTER — Telehealth: Payer: Self-pay | Admitting: Adult Health

## 2021-06-10 NOTE — Telephone Encounter (Signed)
Message sent to Jana Half with [facility] to see if they have staffing/availability to accept patient's home health referral.

## 2021-06-11 ENCOUNTER — Ambulatory Visit: Payer: Medicare Other

## 2021-06-14 NOTE — Telephone Encounter (Signed)
[facility] advises that they are able to accept patient. They will call her to set up services.

## 2021-06-17 DIAGNOSIS — D509 Iron deficiency anemia, unspecified: Secondary | ICD-10-CM | POA: Diagnosis not present

## 2021-06-17 DIAGNOSIS — G2581 Restless legs syndrome: Secondary | ICD-10-CM | POA: Diagnosis not present

## 2021-06-17 DIAGNOSIS — I10 Essential (primary) hypertension: Secondary | ICD-10-CM | POA: Diagnosis not present

## 2021-06-17 DIAGNOSIS — R413 Other amnesia: Secondary | ICD-10-CM | POA: Diagnosis not present

## 2021-06-17 DIAGNOSIS — K219 Gastro-esophageal reflux disease without esophagitis: Secondary | ICD-10-CM | POA: Diagnosis not present

## 2021-06-17 DIAGNOSIS — Z8673 Personal history of transient ischemic attack (TIA), and cerebral infarction without residual deficits: Secondary | ICD-10-CM | POA: Diagnosis not present

## 2021-06-17 DIAGNOSIS — G40001 Localization-related (focal) (partial) idiopathic epilepsy and epileptic syndromes with seizures of localized onset, not intractable, with status epilepticus: Secondary | ICD-10-CM | POA: Diagnosis not present

## 2021-06-17 DIAGNOSIS — H5500 Unspecified nystagmus: Secondary | ICD-10-CM | POA: Diagnosis not present

## 2021-06-17 DIAGNOSIS — E785 Hyperlipidemia, unspecified: Secondary | ICD-10-CM | POA: Diagnosis not present

## 2021-06-17 DIAGNOSIS — K5790 Diverticulosis of intestine, part unspecified, without perforation or abscess without bleeding: Secondary | ICD-10-CM | POA: Diagnosis not present

## 2021-06-17 DIAGNOSIS — R490 Dysphonia: Secondary | ICD-10-CM | POA: Diagnosis not present

## 2021-06-17 DIAGNOSIS — J439 Emphysema, unspecified: Secondary | ICD-10-CM | POA: Diagnosis not present

## 2021-06-17 DIAGNOSIS — R1310 Dysphagia, unspecified: Secondary | ICD-10-CM | POA: Diagnosis not present

## 2021-06-17 DIAGNOSIS — K760 Fatty (change of) liver, not elsewhere classified: Secondary | ICD-10-CM | POA: Diagnosis not present

## 2021-06-17 DIAGNOSIS — Z9181 History of falling: Secondary | ICD-10-CM | POA: Diagnosis not present

## 2021-06-17 DIAGNOSIS — R296 Repeated falls: Secondary | ICD-10-CM | POA: Diagnosis not present

## 2021-06-17 DIAGNOSIS — Z7901 Long term (current) use of anticoagulants: Secondary | ICD-10-CM | POA: Diagnosis not present

## 2021-06-17 DIAGNOSIS — G119 Hereditary ataxia, unspecified: Secondary | ICD-10-CM | POA: Diagnosis not present

## 2021-06-21 ENCOUNTER — Telehealth: Payer: Self-pay | Admitting: Adult Health

## 2021-06-21 NOTE — Telephone Encounter (Signed)
PT Kristine Francis @ [facility] has called for v.o. for Home Health PT 1 week 1, 2 week 4, and 1 week 4.  Kristine Francis also asked for an eval for OT, her voicemail is secure if a vm needs to be left.

## 2021-06-21 NOTE — Telephone Encounter (Signed)
Ok to give v.o. per MM NP. I have called Kelly @ [facility] and LVM with the v.o. for OT eval and PT 1 week 1, 2 week 4, and 1 week 4. Left office number for call back if needed.

## 2021-06-25 DIAGNOSIS — K5790 Diverticulosis of intestine, part unspecified, without perforation or abscess without bleeding: Secondary | ICD-10-CM | POA: Diagnosis not present

## 2021-06-25 DIAGNOSIS — R296 Repeated falls: Secondary | ICD-10-CM | POA: Diagnosis not present

## 2021-06-25 DIAGNOSIS — G40001 Localization-related (focal) (partial) idiopathic epilepsy and epileptic syndromes with seizures of localized onset, not intractable, with status epilepticus: Secondary | ICD-10-CM | POA: Diagnosis not present

## 2021-06-25 DIAGNOSIS — G119 Hereditary ataxia, unspecified: Secondary | ICD-10-CM | POA: Diagnosis not present

## 2021-06-25 DIAGNOSIS — I10 Essential (primary) hypertension: Secondary | ICD-10-CM | POA: Diagnosis not present

## 2021-06-25 DIAGNOSIS — K219 Gastro-esophageal reflux disease without esophagitis: Secondary | ICD-10-CM | POA: Diagnosis not present

## 2021-06-30 ENCOUNTER — Telehealth: Payer: Self-pay | Admitting: Adult Health

## 2021-06-30 NOTE — Telephone Encounter (Signed)
Beth from [facility] called wanting to inform us that the pt was out of town this week so she did not do any PT this week. Will resume next week. If you have any questions you can leave a message on Beth's cell phone [phone].

## 2021-06-30 NOTE — Telephone Encounter (Signed)
Noted  

## 2021-07-05 DIAGNOSIS — K219 Gastro-esophageal reflux disease without esophagitis: Secondary | ICD-10-CM | POA: Diagnosis not present

## 2021-07-05 DIAGNOSIS — R296 Repeated falls: Secondary | ICD-10-CM | POA: Diagnosis not present

## 2021-07-05 DIAGNOSIS — G40001 Localization-related (focal) (partial) idiopathic epilepsy and epileptic syndromes with seizures of localized onset, not intractable, with status epilepticus: Secondary | ICD-10-CM | POA: Diagnosis not present

## 2021-07-05 DIAGNOSIS — I10 Essential (primary) hypertension: Secondary | ICD-10-CM | POA: Diagnosis not present

## 2021-07-05 DIAGNOSIS — K5790 Diverticulosis of intestine, part unspecified, without perforation or abscess without bleeding: Secondary | ICD-10-CM | POA: Diagnosis not present

## 2021-07-05 DIAGNOSIS — G119 Hereditary ataxia, unspecified: Secondary | ICD-10-CM | POA: Diagnosis not present

## 2021-07-07 DIAGNOSIS — G40001 Localization-related (focal) (partial) idiopathic epilepsy and epileptic syndromes with seizures of localized onset, not intractable, with status epilepticus: Secondary | ICD-10-CM | POA: Diagnosis not present

## 2021-07-07 DIAGNOSIS — K5790 Diverticulosis of intestine, part unspecified, without perforation or abscess without bleeding: Secondary | ICD-10-CM | POA: Diagnosis not present

## 2021-07-07 DIAGNOSIS — I10 Essential (primary) hypertension: Secondary | ICD-10-CM | POA: Diagnosis not present

## 2021-07-07 DIAGNOSIS — R296 Repeated falls: Secondary | ICD-10-CM | POA: Diagnosis not present

## 2021-07-07 DIAGNOSIS — K219 Gastro-esophageal reflux disease without esophagitis: Secondary | ICD-10-CM | POA: Diagnosis not present

## 2021-07-07 DIAGNOSIS — G119 Hereditary ataxia, unspecified: Secondary | ICD-10-CM | POA: Diagnosis not present

## 2021-07-12 DIAGNOSIS — I10 Essential (primary) hypertension: Secondary | ICD-10-CM | POA: Diagnosis not present

## 2021-07-12 DIAGNOSIS — K219 Gastro-esophageal reflux disease without esophagitis: Secondary | ICD-10-CM | POA: Diagnosis not present

## 2021-07-12 DIAGNOSIS — G119 Hereditary ataxia, unspecified: Secondary | ICD-10-CM | POA: Diagnosis not present

## 2021-07-12 DIAGNOSIS — G40001 Localization-related (focal) (partial) idiopathic epilepsy and epileptic syndromes with seizures of localized onset, not intractable, with status epilepticus: Secondary | ICD-10-CM | POA: Diagnosis not present

## 2021-07-12 DIAGNOSIS — K5790 Diverticulosis of intestine, part unspecified, without perforation or abscess without bleeding: Secondary | ICD-10-CM | POA: Diagnosis not present

## 2021-07-12 DIAGNOSIS — R296 Repeated falls: Secondary | ICD-10-CM | POA: Diagnosis not present

## 2021-07-14 DIAGNOSIS — K219 Gastro-esophageal reflux disease without esophagitis: Secondary | ICD-10-CM | POA: Diagnosis not present

## 2021-07-14 DIAGNOSIS — G119 Hereditary ataxia, unspecified: Secondary | ICD-10-CM | POA: Diagnosis not present

## 2021-07-14 DIAGNOSIS — I10 Essential (primary) hypertension: Secondary | ICD-10-CM | POA: Diagnosis not present

## 2021-07-14 DIAGNOSIS — K5790 Diverticulosis of intestine, part unspecified, without perforation or abscess without bleeding: Secondary | ICD-10-CM | POA: Diagnosis not present

## 2021-07-14 DIAGNOSIS — R296 Repeated falls: Secondary | ICD-10-CM | POA: Diagnosis not present

## 2021-07-14 DIAGNOSIS — G40001 Localization-related (focal) (partial) idiopathic epilepsy and epileptic syndromes with seizures of localized onset, not intractable, with status epilepticus: Secondary | ICD-10-CM | POA: Diagnosis not present

## 2021-07-17 DIAGNOSIS — Z7901 Long term (current) use of anticoagulants: Secondary | ICD-10-CM | POA: Diagnosis not present

## 2021-07-17 DIAGNOSIS — I10 Essential (primary) hypertension: Secondary | ICD-10-CM | POA: Diagnosis not present

## 2021-07-17 DIAGNOSIS — H5500 Unspecified nystagmus: Secondary | ICD-10-CM | POA: Diagnosis not present

## 2021-07-17 DIAGNOSIS — R1310 Dysphagia, unspecified: Secondary | ICD-10-CM | POA: Diagnosis not present

## 2021-07-17 DIAGNOSIS — R413 Other amnesia: Secondary | ICD-10-CM | POA: Diagnosis not present

## 2021-07-17 DIAGNOSIS — K5790 Diverticulosis of intestine, part unspecified, without perforation or abscess without bleeding: Secondary | ICD-10-CM | POA: Diagnosis not present

## 2021-07-17 DIAGNOSIS — Z8673 Personal history of transient ischemic attack (TIA), and cerebral infarction without residual deficits: Secondary | ICD-10-CM | POA: Diagnosis not present

## 2021-07-17 DIAGNOSIS — K219 Gastro-esophageal reflux disease without esophagitis: Secondary | ICD-10-CM | POA: Diagnosis not present

## 2021-07-17 DIAGNOSIS — G2581 Restless legs syndrome: Secondary | ICD-10-CM | POA: Diagnosis not present

## 2021-07-17 DIAGNOSIS — E785 Hyperlipidemia, unspecified: Secondary | ICD-10-CM | POA: Diagnosis not present

## 2021-07-17 DIAGNOSIS — R490 Dysphonia: Secondary | ICD-10-CM | POA: Diagnosis not present

## 2021-07-17 DIAGNOSIS — K760 Fatty (change of) liver, not elsewhere classified: Secondary | ICD-10-CM | POA: Diagnosis not present

## 2021-07-17 DIAGNOSIS — R296 Repeated falls: Secondary | ICD-10-CM | POA: Diagnosis not present

## 2021-07-17 DIAGNOSIS — Z9181 History of falling: Secondary | ICD-10-CM | POA: Diagnosis not present

## 2021-07-17 DIAGNOSIS — J439 Emphysema, unspecified: Secondary | ICD-10-CM | POA: Diagnosis not present

## 2021-07-17 DIAGNOSIS — G40001 Localization-related (focal) (partial) idiopathic epilepsy and epileptic syndromes with seizures of localized onset, not intractable, with status epilepticus: Secondary | ICD-10-CM | POA: Diagnosis not present

## 2021-07-17 DIAGNOSIS — D509 Iron deficiency anemia, unspecified: Secondary | ICD-10-CM | POA: Diagnosis not present

## 2021-07-17 DIAGNOSIS — G119 Hereditary ataxia, unspecified: Secondary | ICD-10-CM | POA: Diagnosis not present

## 2021-07-19 ENCOUNTER — Other Ambulatory Visit: Payer: Self-pay

## 2021-07-19 ENCOUNTER — Other Ambulatory Visit: Payer: Self-pay | Admitting: Family Medicine

## 2021-07-19 DIAGNOSIS — I10 Essential (primary) hypertension: Secondary | ICD-10-CM | POA: Diagnosis not present

## 2021-07-19 DIAGNOSIS — G119 Hereditary ataxia, unspecified: Secondary | ICD-10-CM | POA: Diagnosis not present

## 2021-07-19 DIAGNOSIS — K5790 Diverticulosis of intestine, part unspecified, without perforation or abscess without bleeding: Secondary | ICD-10-CM | POA: Diagnosis not present

## 2021-07-19 DIAGNOSIS — G40001 Localization-related (focal) (partial) idiopathic epilepsy and epileptic syndromes with seizures of localized onset, not intractable, with status epilepticus: Secondary | ICD-10-CM | POA: Diagnosis not present

## 2021-07-19 DIAGNOSIS — R296 Repeated falls: Secondary | ICD-10-CM | POA: Diagnosis not present

## 2021-07-19 DIAGNOSIS — K219 Gastro-esophageal reflux disease without esophagitis: Secondary | ICD-10-CM | POA: Diagnosis not present

## 2021-07-21 DIAGNOSIS — I10 Essential (primary) hypertension: Secondary | ICD-10-CM | POA: Diagnosis not present

## 2021-07-21 DIAGNOSIS — G119 Hereditary ataxia, unspecified: Secondary | ICD-10-CM | POA: Diagnosis not present

## 2021-07-21 DIAGNOSIS — K219 Gastro-esophageal reflux disease without esophagitis: Secondary | ICD-10-CM | POA: Diagnosis not present

## 2021-07-21 DIAGNOSIS — K5790 Diverticulosis of intestine, part unspecified, without perforation or abscess without bleeding: Secondary | ICD-10-CM | POA: Diagnosis not present

## 2021-07-21 DIAGNOSIS — G40001 Localization-related (focal) (partial) idiopathic epilepsy and epileptic syndromes with seizures of localized onset, not intractable, with status epilepticus: Secondary | ICD-10-CM | POA: Diagnosis not present

## 2021-07-21 DIAGNOSIS — R296 Repeated falls: Secondary | ICD-10-CM | POA: Diagnosis not present

## 2021-07-27 DIAGNOSIS — K219 Gastro-esophageal reflux disease without esophagitis: Secondary | ICD-10-CM | POA: Diagnosis not present

## 2021-07-27 DIAGNOSIS — G119 Hereditary ataxia, unspecified: Secondary | ICD-10-CM | POA: Diagnosis not present

## 2021-07-27 DIAGNOSIS — G40001 Localization-related (focal) (partial) idiopathic epilepsy and epileptic syndromes with seizures of localized onset, not intractable, with status epilepticus: Secondary | ICD-10-CM | POA: Diagnosis not present

## 2021-07-27 DIAGNOSIS — I10 Essential (primary) hypertension: Secondary | ICD-10-CM | POA: Diagnosis not present

## 2021-07-27 DIAGNOSIS — R296 Repeated falls: Secondary | ICD-10-CM | POA: Diagnosis not present

## 2021-07-27 DIAGNOSIS — K5790 Diverticulosis of intestine, part unspecified, without perforation or abscess without bleeding: Secondary | ICD-10-CM | POA: Diagnosis not present

## 2021-07-29 DIAGNOSIS — I10 Essential (primary) hypertension: Secondary | ICD-10-CM | POA: Diagnosis not present

## 2021-07-29 DIAGNOSIS — K219 Gastro-esophageal reflux disease without esophagitis: Secondary | ICD-10-CM | POA: Diagnosis not present

## 2021-07-29 DIAGNOSIS — K5790 Diverticulosis of intestine, part unspecified, without perforation or abscess without bleeding: Secondary | ICD-10-CM | POA: Diagnosis not present

## 2021-07-29 DIAGNOSIS — G119 Hereditary ataxia, unspecified: Secondary | ICD-10-CM | POA: Diagnosis not present

## 2021-07-29 DIAGNOSIS — G40001 Localization-related (focal) (partial) idiopathic epilepsy and epileptic syndromes with seizures of localized onset, not intractable, with status epilepticus: Secondary | ICD-10-CM | POA: Diagnosis not present

## 2021-07-29 DIAGNOSIS — R296 Repeated falls: Secondary | ICD-10-CM | POA: Diagnosis not present

## 2021-08-03 DIAGNOSIS — G119 Hereditary ataxia, unspecified: Secondary | ICD-10-CM | POA: Diagnosis not present

## 2021-08-03 DIAGNOSIS — R296 Repeated falls: Secondary | ICD-10-CM | POA: Diagnosis not present

## 2021-08-03 DIAGNOSIS — K5790 Diverticulosis of intestine, part unspecified, without perforation or abscess without bleeding: Secondary | ICD-10-CM | POA: Diagnosis not present

## 2021-08-03 DIAGNOSIS — G40001 Localization-related (focal) (partial) idiopathic epilepsy and epileptic syndromes with seizures of localized onset, not intractable, with status epilepticus: Secondary | ICD-10-CM | POA: Diagnosis not present

## 2021-08-03 DIAGNOSIS — K219 Gastro-esophageal reflux disease without esophagitis: Secondary | ICD-10-CM | POA: Diagnosis not present

## 2021-08-03 DIAGNOSIS — I10 Essential (primary) hypertension: Secondary | ICD-10-CM | POA: Diagnosis not present

## 2021-08-05 ENCOUNTER — Telehealth: Payer: Self-pay | Admitting: Family Medicine

## 2021-08-05 DIAGNOSIS — I10 Essential (primary) hypertension: Secondary | ICD-10-CM | POA: Diagnosis not present

## 2021-08-05 DIAGNOSIS — K5790 Diverticulosis of intestine, part unspecified, without perforation or abscess without bleeding: Secondary | ICD-10-CM | POA: Diagnosis not present

## 2021-08-05 DIAGNOSIS — K219 Gastro-esophageal reflux disease without esophagitis: Secondary | ICD-10-CM | POA: Diagnosis not present

## 2021-08-05 DIAGNOSIS — G119 Hereditary ataxia, unspecified: Secondary | ICD-10-CM | POA: Diagnosis not present

## 2021-08-05 DIAGNOSIS — R296 Repeated falls: Secondary | ICD-10-CM | POA: Diagnosis not present

## 2021-08-05 DIAGNOSIS — G40001 Localization-related (focal) (partial) idiopathic epilepsy and epileptic syndromes with seizures of localized onset, not intractable, with status epilepticus: Secondary | ICD-10-CM | POA: Diagnosis not present

## 2021-08-05 NOTE — Telephone Encounter (Signed)
PT spouse called to request the following meds to be called into the [id number]- [place], [place] [facility] [address] asap as they are out of them:  atorvastatin (LIPITOR) 10 MG tablet amLODipine (NORVASC) 2.5 MG tablet metoprolol tartrate (LOPRESSOR) 25 MG tablet

## 2021-08-05 NOTE — Telephone Encounter (Signed)
Received refill request from pharmacy for medications.    Requested medications has been sent to the pharmacy. Pharmacy will notify patient when ready for pickup.

## 2021-08-06 ENCOUNTER — Other Ambulatory Visit: Payer: Self-pay | Admitting: Family Medicine

## 2021-08-06 MED ORDER — METOPROLOL TARTRATE 25 MG PO TABS: 25.0000 mg | ORAL_TABLET | Freq: Two times a day (BID) | ORAL | 0 refills | Status: DC

## 2021-08-10 DIAGNOSIS — K219 Gastro-esophageal reflux disease without esophagitis: Secondary | ICD-10-CM | POA: Diagnosis not present

## 2021-08-10 DIAGNOSIS — I10 Essential (primary) hypertension: Secondary | ICD-10-CM | POA: Diagnosis not present

## 2021-08-10 DIAGNOSIS — G40001 Localization-related (focal) (partial) idiopathic epilepsy and epileptic syndromes with seizures of localized onset, not intractable, with status epilepticus: Secondary | ICD-10-CM | POA: Diagnosis not present

## 2021-08-10 DIAGNOSIS — R296 Repeated falls: Secondary | ICD-10-CM | POA: Diagnosis not present

## 2021-08-10 DIAGNOSIS — G119 Hereditary ataxia, unspecified: Secondary | ICD-10-CM | POA: Diagnosis not present

## 2021-08-10 DIAGNOSIS — K5790 Diverticulosis of intestine, part unspecified, without perforation or abscess without bleeding: Secondary | ICD-10-CM | POA: Diagnosis not present

## 2021-08-16 ENCOUNTER — Ambulatory Visit (INDEPENDENT_AMBULATORY_CARE_PROVIDER_SITE_OTHER): Payer: Medicare Other | Admitting: Podiatry

## 2021-08-16 ENCOUNTER — Encounter: Payer: Self-pay | Admitting: Podiatry

## 2021-08-16 ENCOUNTER — Other Ambulatory Visit: Payer: Self-pay

## 2021-08-16 DIAGNOSIS — J301 Allergic rhinitis due to pollen: Secondary | ICD-10-CM | POA: Insufficient documentation

## 2021-08-16 DIAGNOSIS — M79674 Pain in right toe(s): Secondary | ICD-10-CM | POA: Diagnosis not present

## 2021-08-16 DIAGNOSIS — J309 Allergic rhinitis, unspecified: Secondary | ICD-10-CM | POA: Insufficient documentation

## 2021-08-16 DIAGNOSIS — B351 Tinea unguium: Secondary | ICD-10-CM | POA: Diagnosis not present

## 2021-08-16 DIAGNOSIS — G629 Polyneuropathy, unspecified: Secondary | ICD-10-CM | POA: Diagnosis not present

## 2021-08-16 DIAGNOSIS — M79675 Pain in left toe(s): Secondary | ICD-10-CM

## 2021-08-16 DIAGNOSIS — L84 Corns and callosities: Secondary | ICD-10-CM | POA: Diagnosis not present

## 2021-08-16 DIAGNOSIS — J3081 Allergic rhinitis due to animal (cat) (dog) hair and dander: Secondary | ICD-10-CM | POA: Insufficient documentation

## 2021-08-20 NOTE — Progress Notes (Signed)
  Subjective:  Patient ID: Kristine Francis, female    DOB: March 03, 1941,  MRN: [id number]  Kristine Francis presents to clinic today for at risk foot care with history of peripheral neuropathy and callus(es) right foot and painful thick toenails that are difficult to trim. Painful toenails interfere with ambulation. Aggravating factors include wearing enclosed shoe gear. Pain is relieved with periodic professional debridement. Painful calluses are aggravated when weightbearing with and without shoegear. Pain is relieved with periodic professional debridement.  Her husband is present during today's visit.  PCP is [name], MD , and last visit was 12/30/2020.  Allergies  Allergen Reactions   Contrast Media [Iodinated Diagnostic Agents] Other (See Comments)    Congestion .Marland KitchenMarland Kitchenpatient stated it irritated her eyes and voice   Iohexol Other (See Comments)    Gi upset   Lisinopril Cough    Review of Systems: Negative except as noted in the HPI. Objective:   Constitutional Kristine Francis is a pleasant 80 y.o. Caucasian female, in NAD. AAO x 3.   Vascular Capillary refill time to digits immediate b/l. Palpable DP pulse(s) b/l lower extremities Palpable PT pulse(s) b/l lower extremities Pedal hair absent. Lower extremity skin temperature gradient within normal limits. No pain with calf compression b/l. No edema noted b/l lower extremities. No cyanosis or clubbing noted.  Neurologic Normal speech. Oriented to person, place, and time. Protective sensation decreased with 10 gram monofilament b/l. Vibratory sensation decreased b/l.  Dermatologic Skin warm and supple b/l lower extremities. No open wounds b/l lower extremities. No interdigital macerations b/l lower extremities. Toenails 1-5 b/l elongated, discolored, dystrophic, thickened, crumbly with subungual debris and tenderness to dorsal palpation. Hyperkeratotic lesion(s) submet head 1 right foot.  No erythema, no edema, no drainage, no  fluctuance.  Orthopedic: Normal muscle strength 5/5 to all lower extremity muscle groups bilaterally. Hammertoe(s) noted to the 1-5 bilaterally.   Radiographs: None Assessment:   1. Pain due to onychomycosis of toenails of both feet   2. Callus of foot   3. Sensory neuropathy    Plan:  Patient was evaluated and treated and all questions answered. Consent given for treatment as described below: -Examined patient. -Patient/POA educated on dangers of using sharp instrumentation on toes/feet. Recommended continued professional foot care in presence of neuropathy. Patient/POA relates understanding. -Patient to continue soft, supportive shoe gear daily. -Toenails 1-5 b/l were debrided in length and girth with sterile nail nippers and dremel without iatrogenic bleeding.  -Callus(es) submet head 1 right foot pared utilizing sterile scalpel blade without complication or incident. Total number debrided =1. -Patient to report any pedal injuries to medical professional immediately. -Patient/POA to call should there be question/concern in the interim.  Return in about 3 months (around 11/15/2021).  Marzetta Board, DPM

## 2021-09-07 ENCOUNTER — Ambulatory Visit (INDEPENDENT_AMBULATORY_CARE_PROVIDER_SITE_OTHER): Payer: Medicare Other | Admitting: Neurology

## 2021-09-07 ENCOUNTER — Encounter: Payer: Self-pay | Admitting: Neurology

## 2021-09-07 ENCOUNTER — Telehealth: Payer: Self-pay | Admitting: Neurology

## 2021-09-07 VITALS — BP 154/87 | HR 71

## 2021-09-07 DIAGNOSIS — I63139 Cerebral infarction due to embolism of unspecified carotid artery: Secondary | ICD-10-CM

## 2021-09-07 DIAGNOSIS — G40409 Other generalized epilepsy and epileptic syndromes, not intractable, without status epilepticus: Secondary | ICD-10-CM

## 2021-09-07 DIAGNOSIS — G3184 Mild cognitive impairment, so stated: Secondary | ICD-10-CM

## 2021-09-07 DIAGNOSIS — R269 Unspecified abnormalities of gait and mobility: Secondary | ICD-10-CM | POA: Diagnosis not present

## 2021-09-07 MED ORDER — LEVETIRACETAM 500 MG PO TABS: 500.0000 mg | ORAL_TABLET | Freq: Two times a day (BID) | ORAL | 1 refills | Status: DC

## 2021-09-07 MED ORDER — ADULT MULTIVITAMIN W/MINERALS CH: 1.0000 | ORAL_TABLET | Freq: Every day | ORAL | Status: DC

## 2021-09-07 NOTE — Progress Notes (Signed)
Provider:  Larey Seat, MD  Primary Care Physician:  [name], MD [place] [address]     Referring Provider: [name], Md [place],  [place] [id number]          Chief Complaint according to patient   Patient presents with:     New Patient (Initial Visit)     Pt with husband, rm 86. Presents today follow up post Rehab complete- ER visit in Jan 22. She was hospitalized with a stroke and suffered a new onset seizure.  She has been working and completing therapy intially in the inpt rehab and not outpt. She has completed PT,OT and ST.  Overall doing well. Was started on Keppra in hospital. Right side was more effected then left. States there are moments where laying flat and feels like she is falling.  No dizziness associated. Abnormal EEG.       HISTORY OF PRESENT ILLNESS:   Kristine Francis is a 80 y.o. year old Caucasian female patient is seen 09/07/2021 , [place] [facility]. Interval history -09-07-2021.  EEG abnormal  This patient is a 80 year old woman with a past medical history of emergency room admission as code stroke and a witnessed new onset seizure by ED physician.  The patient is completing physical therapy occupational therapy and speech therapy and was started on Keppra in the hospital.  During her visit at [facility] the patient appeared dysphonic, dysarthric with a new onset nystagmus.  She had finished with ST. Has seen Megan, PT- in follow up just 06-30-2021. OT and PT at home completed.   This EEG study was performed by following the international 10-20 system of electrode placement. A posterior dominant rhythm was noted to be slow at 7 Hz within the left occipital hemisphere.  At the same time the right occipital hemisphere showed an 8 Hz posterior rhythm. Within the left hemisphere there was also a larger amplitude noted over TV 7 P7.   The patient proceeded to hyperventilation which did lead to amplitude buildup but the left hemisphere remained slower.  After 2 minutes photic stimulation followed, noticeable posterior entrainment resulted.  After a brief resting period, I noted an isolated occurrence of phase reversal over T7. Please note also that the EKG electrode showed a bradycardia rate in the 40s.  Conclusion this is an abnormal EEG with asymmetry- indicating the left brain to be slower and a possible focus of seizure activity.  Larey Seat, MD     .  Chief concern according to patient : So I have the pleasure of seeing Kristine Francis today who prefers to go by the name Kristine Francis.  She is a 80 year old female was most recently neurology patient with Dr. Carles Collet and [facility].  On November 25, 2020 she was admitted to hospital as a code stroke.  She had been at her baseline, she was in the presence of her grand children at home when the granddaughter heard a loud scream and found the patient leaning over down on the table.  Husband returned soon after to find her leaning on the table unable to move.  EMS was called she was brought in as a code stroke she had a right gaze deviation followed for one-step commands only and does seem not to have any word output.  In the emergency room she had developed copious  amounts of secretions and she had a generalized tonic-clonic seizure in front of the consulting physician.  Was an ictal cry her right hand went up tonically gaze was deviated to the right and then she had full body rhythmic generalized tonic-clonic convulsions.  She went bradycardic into her 40s she was not intubated given 2 mg of Ativan and loaded with 1000 mg of Keppra IV a CT angio was obtained CT head was negative for territorial infarct she was premedicated with Solu-Medrol Benadryl and then a CT angiogram with contrast was obtained..  She had no previous indication of a CNS infection, there was no evidence of any  kind of unusual headache, confusion or delirium.  No prescribed his prior history of seizures.  TPA was not given as the patient did not show signs of a stroke by CT.  The patient had been seen in 2018 for stroke with follow-up with Dr. Guillermina [place].  MRI of the brain and a EEG were also ordered EEG was a little impaired because of the sedation.  So there were no seizures or epileptiform discharges noted on the EEG as this was performed while the patient was already under sedate of medications.  The MRI with and without contrast had not been obtained on 5 January cerebrum remote and only lacunar infarct in her right basal ganglia small vessel ischemia no acute infarction hemorrhage hydrocephalus also was noted.  So the stroke was actually not found.  She then  Underwent some rehab and therapy with speech pathology especially because her swallowing and her voice had changed.  Speech therapy was recommended for a duration of 2 weeks twice a week and is now completed.  She still is very hoarse laboratories show that the patient was quite anemic in January and that she had low sodium levels down 232 mmol/L, her calcium levels were 8.2 also low but should have meanwhile been corrected.  She was found to have GI bleed with heme positive stools.  Dr. 5 her family doctor saw the patient and stated that he had no sign of further seizure activity.  She had remained encephalopathic for several weeks until he saw her on 30 December 2020.   She remains dysphonic and dysphagic- no more seizure activity.        [name] or Caucasian female with a recent stroke, and  has a past medical history of Allergy, Asthma, At high risk for falls, Ataxia, Cataract, Diverticulosis, Emphysema, Gait abnormality (05/14/2013), GERD (gastroesophageal reflux disease), Hepatic steatosis, Hyperlipidemia, Hypertension, Iron deficiency anemia, unspecified, Kidney cysts (11/16/12), Nystagmus, end-position, Osteopenia, Renal cyst, and Stroke  ([place]) (01/2017) seen by Dr. Carles Collet, DO, neuro [place]  through 2018.       Social history: Patient lives in a household with spouse of 28 years- adult children and many grandchildren. Reports memory loss.  Tobacco use- none .  ETOH use; none ,  Caffeine intake in form of Coffee( 1-2) Soda( /) Tea (/) or energy drinks. RLS, longstanding gait problems.      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 married - 1965. 3 sons - '66, '69, '70. Sons in good health. Marriage in good health. Enjoys retirement - remains active.         Social Determinants of Health   Financial Resource Strain: Low Risk    Difficulty of Paying Living Expenses: Not hard at all  Food Insecurity: No Food Insecurity   Worried About Charity fundraiser in the Last Year: Never true   [place] in the Last Year: Never true  Transportation Needs: No Transportation Needs   Lack of Transportation (Medical): No   Lack of Transportation (Non-Medical): No  Physical Activity: Inactive   Days of Exercise per Week: 0 days   Minutes of Exercise per Session: 0 min  Stress: No Stress Concern Present    Feeling of Stress : Not at all  Social Connections: Moderately Integrated   Frequency of Communication with Friends and Family: More than three times a week   Frequency of Social Gatherings with Friends and Family: More than three times a week   Attends Religious Services: 1 to 4 times per year   Active Member of [employer] or Organizations: No   Attends Archivist Meetings: Never   Marital Status: Married    Family History  Problem Relation Age of Onset   Colon cancer Father    Coronary artery disease Father    Heart block Father    Pancreatic cancer Mother    Throat cancer Mother    Colon cancer Paternal Grandmother    Colon cancer Paternal Aunt        x 2   Cancer - Lung Paternal Aunt    Breast cancer Paternal Aunt    Colon cancer Paternal Aunt    Ovarian cancer Maternal Aunt    Heart attack Paternal Grandfather    Esophageal cancer Other    Colon polyps Neg Hx    Rectal cancer Neg Hx    Stomach cancer Neg Hx     Past Medical History:  Diagnosis Date   Allergy    Asthma    sees Dr. Tiajuana Amass - as child per pt   At high risk for falls    unstable gait    Ataxia    Cataract    removed bilaterally    Diverticulosis    Emphysema    Gait abnormality 05/14/2013   Ms.Short a patient of Dr. Linda Hedges presented first [id number] with a gait dysfunction, she also had an episodic confusion and . Exam found a mild nystagmus and she was referred to ophthalmology on 03-2812, brain MRI was normal nystagmus was a secondary diagnosis to vestibulitis, ear nose and throat has followed and had seen the patient.  2018 -TAT followed after CVA and TIA 01-2017    GERD (gastroesophageal reflux disease)    Hepatic steatosis    Hyperlipidemia    on atorvastatin    Hypertension    on losartan  Iron deficiency anemia, unspecified    Kidney cysts 11/16/12   small cyst on left   Nystagmus, end-position    Osteopenia    Renal cyst    Stroke ([place]) 01/2017   mild     Past Surgical  History:  Procedure Laterality Date   ANAL RECTAL MANOMETRY N/A 12/20/2017   Procedure: ANO RECTAL MANOMETRY;  Surgeon: Mauri Pole, MD;  Location: [facility] END[facility]COPY;  Service: Endoscopy;  Laterality: N/A;   BREAST BIOPSY Bilateral 1970's   CATARACT EXTRACTION Bilateral LT:02/14/13,RT:02/21/13   CERVICAL SPINE SURGERY  2010   COLONOSCOPY  03-12-14   per Dr. Olevia Perches, diverticulosis only, repeat 5 yrs    EYE SURGERY Bilateral 01/2013&02/2013   left then right   HERNIA REPAIR  Oct '14-left, remote-right   years ago right; left done '14   [place]  05/13/13   FACE, basal cell   TONSILLECTOMY       Current Outpatient Medications on File Prior to Visit  Medication Sig Dispense Refill   acetaminophen (TYLENOL) 325 MG tablet Take 1-2 tablets (325-650 mg total) by mouth every 4 (four) hours as needed for mild pain.     amLODipine (NORVASC) 2.5 MG tablet TAKE 1 TABLET DAILY 90 tablet 0   atorvastatin (LIPITOR) 10 MG tablet TAKE 1 TABLET DAILY 90 tablet 0   ELIQUIS 5 MG TABS tablet TAKE 1 TABLET TWICE A DAY 180 tablet 3   EPINEPHrine 0.3 mg/0.3 mL IJ SOAJ injection Inject 0.3 mg into the muscle as needed for anaphylaxis.     furosemide (LASIX) 20 MG tablet (Prior Auth: Rx ZGY#:[id number])     ibuprofen (ADVIL) 800 MG tablet      losartan (COZAAR) 50 MG tablet (Prior Auth: Rx MBW#:[id number])     melatonin 3 MG TABS tablet Take 1 tablet (3 mg total) by mouth at bedtime. 30 tablet 11   metoprolol tartrate (LOPRESSOR) 25 MG tablet Take 1 tablet (25 mg total) by mouth 2 (two) times daily. 180 tablet 0   Multiple Vitamin (MULTIVITAMIN WITH MINERALS) TABS tablet Take 1 tablet by mouth daily.     pantoprazole (PROTONIX) 20 MG tablet TAKE 1 TABLET DAILY 90 tablet 3   PANTOPRAZOLE SODIUM PO      STARCH-MALTO DEXTRIN (THICK-IT) PACK Take 1 each by mouth 4 (four) times daily as needed. Thicken liquids to honey consistency 200 each 0   levETIRAcetam (KEPPRA) 500 MG tablet Take 1 tablet (500 mg  total) by mouth 2 (two) times daily. 180 tablet 0   No current facility-administered medications on file prior to visit.    Allergies  Allergen Reactions   Contrast Media [Iodinated Diagnostic Agents] Other (See Comments)    Congestion .Marland KitchenMarland Kitchenpatient stated it irritated her eyes and voice   Iohexol Other (See Comments)    Gi upset   Lisinopril Cough    Physical exam:  Today's Vitals   09/07/21 0926  BP: (!) 154/87  Pulse: 71   There is no height or weight on file to calculate BMI.   Wt Readings from Last 3 Encounters:  06/08/21 92 lb 12.8 oz (42.1 kg)  02/05/21 89 lb 6.4 oz (40.6 kg)  02/01/21 91 lb (41.3 kg)     Ht Readings from Last 3 Encounters:  06/08/21 5' (1.524 m)  02/05/21 5' (1.524 m)  02/01/21 5' (1.524 m)      General: The patient is awake, alert and appears not in acute distress. Wheelchair seated- The patient is well groomed. Head: Normocephalic,  atraumatic.  Neck is supple. Mallampati :1.  Thickened liquids have been taken to help with swallowing. Anterior fusion scar well healed.  neck circumference:12 inches .  Nasal airflow patent. Very skinny,  Cardiovascular:  Regular rate and cardiac rhythm by pulse,  without distended neck veins. Respiratory: Lungs are clear to auscultation.  Skin:  Without evidence of ankle edema, or rash. Trunk: slightly stooped.  Neurologic exam : The patient is awake and alert, oriented to place and time.   Memory subjective described as intact.  3- 14 -2022 Attention span & concentration ability appears limited   Speech is fluent,  but laboured - with  dysarthria, dysphonia- and she reports dysphagia.   Mood and affect are appropriate.   Cranial nerves: no loss of smell or taste reported  Pupils are equal and briskly reactive to light. Funduscopic exam deferred. Left pupil is larger , iris scar noted , status post bilateral lens implant.  Extraocular movements in vertical and horizontal planes were with Nystagmus. The  left gaze provoked nystagmus -  No Diplopia. Visual fields by finger perimetry are intact. Hearing was intact to tuning fork testing.   Facial sensation intact to fine touch.  Facial motor strength is symmetric and tongue and uvula move midline.  Neck ROM : rotation, tilt and flexion extension were normal for age and shoulder shrug was symmetrical.    Motor exam:  Symmetric bulk, tone and ROM.   Elevated tone without cog wheeling,  symmetric grip strength .   Sensory:  Fine touch, pinprick and vibration were tested  - vibration was not felt at either knee.  Proprioception tested in the upper extremities was normal.   Coordination: Rapid alternating movements in the fingers/hands were of abnormal speed.  The Finger-to-nose maneuver was impaired  with evidence of ataxia, dysmetria - not tremor . Severe ataxia.  Gait and station: Patient could not rise unassisted from a seated position,  Wheelchair. She was able to perform heel-to-shin maneuver.   Deep tendon reflexes: in the upper and lower extremities are resent. Left DTR patella was more brisk than right.  Babinski response was deferred.       1)  The MRI did not confirm any CVA - this was not a stroke, but a Seizure - with status epilepticus as described by neurology hospitalist in ED.Brain: No acute infarction, hemorrhage, hydrocephalus, extra-axial collection or mass lesion.  Remote lacunar infarct in the right basal ganglia region. Scattered foci of T2 hyperintensity are seen within the white matter of the cerebral hemispheres and within the pons, nonspecific, most likely related to chronic small vessel ischemia.    After spending a total time of 40 minutes face to face and additional time for physical and neurologic examination, review of laboratory studies,  personal review of imaging studies, reports and results of other testing and review of referral information / records as far as provided in visit, I have established the  following assessments:   1) seizure - Initial EEG was done under sedation.  Repeat EEG remained abnormal , slow.  FINDINGS:  2) No explanation about the ongoing swallowing and speech impairment- intubation related ??  3) memory has declined-  Chronic , slow progressing   40 upper extremity ataxia, dysmetria.   5)  Neuropathy , gait evaluation was done with PT> patient lives in a single floor home.  Balance is affected.     My Plan is to proceed with:  1) repeat MRI- I have not seen an explanation  for her decline, her abnormal EEG, her asymmetric reflexes.   2)Increasing signs of dementia, patient presented in 2014 with confusion, and this has not step wise declined but continuously progressing, not step by step-   3) continue 500 mg Keppra bid   I would like to thank [name], MD and Alonza Bogus, DO, as well as referring provider   [name], Md [place],  [place] [address] for allowing me to meet with and to take care of this pleasant patient.   In short, BRYCELYN GAMBINO is presenting after a seizure that seems to have been leading in to status, she has dysphonia and dysphagia, and long standing memory lapses. She has not fully recovered and her new status quo is gait impairment, cognitive impairment. Her husband stated she had been with gait impairment already at the time of her retirement, 2006.  Furniture surfing  ever since.  Cognition, gait and balance had drastically declined after her seizure.   I plan to follow up either personally or through our NP within 4-6 month.    MMSE and gait evaluation will be integrated into visit.    CC: I will share my notes with PA Love.  Electronically signed by: Larey Seat, MD 09/07/2021 9:30 AM  [facility] and [facility] Sleep Board certified by The [employer] of Sleep Medicine and Diplomate of the [employer] of Sleep Medicine. Board certified In Neurology through the  [place], Fellow of the [employer] of Neurology. Medical Director of [employer].

## 2021-09-07 NOTE — Telephone Encounter (Signed)
Meidcare/tricare order sent to GI, [facility] they will reach out tot he patient to schedule.

## 2021-09-08 ENCOUNTER — Telehealth: Payer: Self-pay

## 2021-09-08 LAB — COMPREHENSIVE METABOLIC PANEL
ALT: 8 IU/L (ref 0–32)
AST: 18 IU/L (ref 0–40)
Albumin/Globulin Ratio: 1.7 (ref 1.2–2.2)
Albumin: 4.5 g/dL (ref 3.7–4.7)
Alkaline Phosphatase: 76 IU/L (ref 44–121)
BUN/Creatinine Ratio: 17 (ref 12–28)
BUN: 11 mg/dL (ref 8–27)
Bilirubin Total: 1.3 mg/dL — ABNORMAL HIGH (ref 0.0–1.2)
CO2: 22 mmol/L (ref 20–29)
Calcium: 9.6 mg/dL (ref 8.7–10.3)
Chloride: 101 mmol/L (ref 96–106)
Creatinine, Ser: 0.64 mg/dL (ref 0.57–1.00)
Globulin, Total: 2.6 g/dL (ref 1.5–4.5)
Glucose: 93 mg/dL (ref 70–99)
Potassium: 3.9 mmol/L (ref 3.5–5.2)
Sodium: 140 mmol/L (ref 134–144)
Total Protein: 7.1 g/dL (ref 6.0–8.5)
eGFR: 90 mL/min/{1.73_m2} (ref >59)

## 2021-09-08 NOTE — Telephone Encounter (Signed)
-----   Message from Larey Seat, MD sent at 09/08/2021  8:44 AM EDT ----- Unclear why this patient developed higher bilirubin levels, transaminase is normal, normal renal function- can use contrast MRI brain.   Please inform PCP of these findings.

## 2021-09-08 NOTE — Progress Notes (Signed)
See telephone note from 09/08/21.

## 2021-09-08 NOTE — Telephone Encounter (Signed)
Called and relayed message per Dr. [name].   Informed pt that once MRI is approved by insurance, we will be calling to set up a date.  Pt verbalized understanding and had no further question at this time.

## 2021-09-08 NOTE — Progress Notes (Signed)
Unclear why this patient developed higher bilirubin levels, transaminase is normal, normal renal function- can use contrast MRI brain.   Please inform PCP of these findings.

## 2021-09-19 ENCOUNTER — Other Ambulatory Visit: Payer: Medicare Other

## 2021-09-20 ENCOUNTER — Ambulatory Visit
Admission: RE | Admit: 2021-09-20 | Discharge: 2021-09-20 | Disposition: A | Discharge status: Home / Self Care | Payer: Medicare Other | Source: Ambulatory Visit | Attending: Neurology | Admitting: Neurology

## 2021-09-20 ENCOUNTER — Other Ambulatory Visit: Payer: Self-pay

## 2021-09-20 DIAGNOSIS — R269 Unspecified abnormalities of gait and mobility: Secondary | ICD-10-CM

## 2021-09-20 DIAGNOSIS — G40409 Other generalized epilepsy and epileptic syndromes, not intractable, without status epilepticus: Secondary | ICD-10-CM | POA: Diagnosis not present

## 2021-09-20 DIAGNOSIS — G3184 Mild cognitive impairment, so stated: Secondary | ICD-10-CM | POA: Diagnosis not present

## 2021-09-20 MED ORDER — GADOBENATE DIMEGLUMINE 529 MG/ML IV SOLN
9.0000 mL | Freq: Once | INTRAVENOUS | Status: AC | PRN
  Administered 2021-09-20: 9 mL via INTRAVENOUS

## 2021-09-21 ENCOUNTER — Telehealth: Payer: Self-pay | Admitting: Neurology

## 2021-09-21 NOTE — Telephone Encounter (Signed)
Called the patient to review the MRI results. There was no answer and no VM Will try calling the pt again.

## 2021-09-21 NOTE — Progress Notes (Signed)
Patient with sudden onset seizure and remaining gait disorder and memory loss:   There is mild generalized cortical atrophy.  Mild cerebellar atrophy is also noted. Generalized cortical atrophy, is more than typical for age ( *advanced aging process, explaining memory loss and cerebellar atrophy is possible cause of balance and gait problems) . 2.   Scattered T2/FLAIR hyperintense foci predominantly in the deep and subcortical white matter consistent with mild chronic microvascular ischemic change.  None of the foci appear to be acute. ( *This means mild and chronic, not recent, flow changes in the smallest of brain blood vessels)   There is a remote lacunar infarction adjacent to the right basal ganglia.  No changes compared to the 11/26/2020 MRI.( *A lacune is a small stroke, leaving a slit like scar, less than 10 mm in length, and main risk factor is hypertension)  3.   Multiple chronic microhemorrhages, predominantly in the occipital lobes.  The pattern is more consistent with cerebral amyloid angiopathy than with hypertensive microangiopathy. (*There is no specific treatment for amyloid disease if small vessels, but Aspirin doesn't help. ) 4.   No acute findings.  Normal enhancement pattern.   INTERPRETING PHYSICIAN:  Richard A. Felecia Shelling, MD, PhD, Charlynn Grimes

## 2021-09-21 NOTE — Telephone Encounter (Signed)
-----   Message from Larey Seat, MD sent at 09/21/2021 10:36 AM EDT ----- Patient with sudden onset seizure and remaining gait disorder and memory loss:   There is mild generalized cortical atrophy.  Mild cerebellar atrophy is also noted. Generalized cortical atrophy, is more than typical for age ( #advanced aging process, explaining memory loss and cerebellar atrophy is possible cause of balance and gait problems) . 2.   Scattered T2/FLAIR hyperintense foci predominantly in the deep and subcortical white matter consistent with mild chronic microvascular ischemic change.  None of the foci appear to be acute. ( #This means mild and chronic, not recent, flow changes in the smallest of brain blood vessels)   There is a remote lacunar infarction adjacent to the right basal ganglia.  No changes compared to the 11/26/2020 MRI.( #A lacune is a small stroke, leaving a slit like scar, less than 10 mm in length, and main risk factor is hypertension)  3.   Multiple chronic microhemorrhages, predominantly in the occipital lobes.  The pattern is more consistent with cerebral amyloid angiopathy than with hypertensive microangiopathy. (#There is no specific treatment for amyloid disease if small vessels, but Aspirin doesn't help. ) 4.   No acute findings.  Normal enhancement pattern.   INTERPRETING PHYSICIAN:  Richard A. Felecia Shelling, MD, PhD, Charlynn Grimes

## 2021-10-06 NOTE — Telephone Encounter (Signed)
Called the patient and was able to review the MRI results with her.  Pt Verbalized understanding and had no other questions

## 2021-11-03 ENCOUNTER — Other Ambulatory Visit: Payer: Self-pay | Admitting: Family Medicine

## 2021-11-22 ENCOUNTER — Other Ambulatory Visit: Payer: Self-pay | Admitting: Family Medicine

## 2021-11-26 ENCOUNTER — Other Ambulatory Visit: Payer: Self-pay

## 2021-11-26 ENCOUNTER — Encounter: Payer: Self-pay | Admitting: Podiatry

## 2021-11-26 ENCOUNTER — Ambulatory Visit (INDEPENDENT_AMBULATORY_CARE_PROVIDER_SITE_OTHER): Payer: Medicare Other | Admitting: Podiatry

## 2021-11-26 DIAGNOSIS — B351 Tinea unguium: Secondary | ICD-10-CM | POA: Diagnosis not present

## 2021-11-26 DIAGNOSIS — M79674 Pain in right toe(s): Secondary | ICD-10-CM

## 2021-11-26 DIAGNOSIS — M79675 Pain in left toe(s): Secondary | ICD-10-CM | POA: Diagnosis not present

## 2021-12-01 NOTE — Progress Notes (Signed)
°  Subjective:  Patient ID: Kristine Francis, female    DOB: 09-11-1941,  MRN: [id number]  81 y.o. female presents with painful elongated mycotic toenails 1-5 bilaterally which are tender when wearing enclosed shoe gear. Pain is relieved with periodic professional debridement..    Patient is accompanied by her husband on today's visit.   PCP: [name], MD and last visit was: 08/05/2021  Review of Systems: Negative except as noted in the HPI.   Allergies  Allergen Reactions   Contrast Media [Iodinated Contrast Media] Other (See Comments)    Congestion .Marland KitchenMarland Kitchenpatient stated it irritated her eyes and voice   Iohexol Other (See Comments)    Gi upset   Lisinopril Cough    Objective:  There were no vitals filed for this visit. Constitutional Patient is a pleasant 81 y.o. Caucasian female thin build in NAD. AAO x 3.  Vascular Capillary fill time to digits immediate b/l.  DP/PT pulse(s) are palpable b/l lower extremities. Pedal hair absent. Lower extremity skin temperature gradient within normal limits. No pain with calf compression b/l. No edema noted b/l lower extremities. No cyanosis or clubbing noted.   Neurologic Protective sensation decreased with 10 gram monofilament b/l. Vibratory sensation decreased b/l.  Dermatologic Pedal skin is warm and supple b/l.  No open wounds b/l lower extremities. No interdigital macerations b/l lower extremities. Toenails 1-5 b/l elongated, discolored, dystrophic, thickened, crumbly with subungual debris and tenderness to dorsal palpation.   Orthopedic: Muscle strength 5/5 to all lower extremity muscle groups bilaterally. Hammertoe deformity noted 1-5 b/l.   Assessment:   1. Pain due to onychomycosis of toenails of both feet    Plan:  Patient was evaluated and treated and all questions answered. Consent given for treatment as described below: -No new findings. No new orders. -Mycotic toenails 1-5 bilaterally were debrided in length and girth with  sterile nail nippers and dremel without incident. -Patient/POA to call should there be question/concern in the interim.  Return in about 3 months (around 02/24/2022).  Marzetta Board, DPM

## 2022-01-17 ENCOUNTER — Encounter: Payer: Self-pay | Admitting: Adult Health

## 2022-01-17 ENCOUNTER — Ambulatory Visit (INDEPENDENT_AMBULATORY_CARE_PROVIDER_SITE_OTHER): Payer: Medicare Other | Admitting: Adult Health

## 2022-01-17 VITALS — BP 108/68 | HR 61 | Ht 60.0 in | Wt 95.8 lb

## 2022-01-17 DIAGNOSIS — G40409 Other generalized epilepsy and epileptic syndromes, not intractable, without status epilepticus: Secondary | ICD-10-CM | POA: Diagnosis not present

## 2022-01-17 DIAGNOSIS — R413 Other amnesia: Secondary | ICD-10-CM

## 2022-01-17 NOTE — Patient Instructions (Signed)
Your Plan:  Continue keppra 500 mg twice a day Consider Namenda for memory     Thank you for coming to see [place] at [facility]. I hope we have been able to provide you high quality care today.  You may receive a patient satisfaction survey over the next few weeks. We would appreciate your feedback and comments so that we may continue to improve ourselves and the health of our patients.

## 2022-01-17 NOTE — Progress Notes (Signed)
PATIENT: Kristine Francis DOB: Oct 10, 1941  REASON FOR VISIT: follow up HISTORY FROM: patient PRIMARY NEUROLOGIST: Dr. [name]  HISTORY OF PRESENT ILLNESS: Today 01/17/22:  Kristine Francis is a 81 year old female with a history of stroke, memory disturbance and seizures.  She returns today for follow-up.  She denies any seizure events.  Continues on Keppra 500 mg twice a day.  She feels that overall her symptoms have remained stable.  She lives at home with her husband.  Able to complete all ADLs independently.  Her husband manages her medications and appointments.  She does not operate a motor vehicle.  She is currently not on any medication for her memory.  She returns today for evaluation.  HISTORY 06/08/21:   Kristine Francis is a 81 year old female with a history of stroke and seizures.  She returns today for follow-up.  She remains on Keppra 500 mg twice a day.  She denies any seizure events.  She is able to complete all ADLs independently.  She lives with her husband.  Reports that she is sometimes forgetful but overall feels that her memory has remained stable.  Her husband now manages all the finances.  She manages her appointments and medications however her husband does fill the pillbox for her.  She continues to have ongoing trouble with her gait.  Has been diagnosed in the past by [facility] with cerebellar ataxia.  She reports that she typically has falls inside her home.  She does not use a walker inside her home.  Fortunately she has not had any severe injuries.  She does use a walker or wheelchair outside of the home.  She returns today for an evaluation  REVIEW OF SYSTEMS: Out of a complete 14 system review of symptoms, the patient complains only of the following symptoms, and all other reviewed systems are negative.  ALLERGIES: Allergies  Allergen Reactions   Contrast Media [Iodinated Contrast Media] Other (See Comments)    Congestion .Marland KitchenMarland Kitchenpatient stated it irritated her eyes and voice    Iohexol Other (See Comments)    Gi upset   Lisinopril Cough    HOME MEDICATIONS: Outpatient Medications Prior to Visit  Medication Sig Dispense Refill   acetaminophen (TYLENOL) 325 MG tablet Take 1-2 tablets (325-650 mg total) by mouth every 4 (four) hours as needed for mild pain. (Patient taking differently: Take 325-650 mg by mouth as needed for mild pain.)     amLODipine (NORVASC) 2.5 MG tablet TAKE 1 TABLET DAILY 90 tablet 0   atorvastatin (LIPITOR) 10 MG tablet TAKE 1 TABLET DAILY 90 tablet 0   [facility] MELATONIN 3 MG TABS tablet TAKE 1 TABLET BY MOUTH AT BEDTIME. 90 tablet 3   ELIQUIS 5 MG TABS tablet TAKE 1 TABLET TWICE A DAY 180 tablet 3   EPINEPHrine 0.3 mg/0.3 mL IJ SOAJ injection Inject 0.3 mg into the muscle as needed for anaphylaxis.     ibuprofen (ADVIL) 800 MG tablet      metoprolol tartrate (LOPRESSOR) 25 MG tablet TAKE 1 TABLET BY MOUTH TWICE A DAY 180 tablet 0   Multiple Vitamin (MULTIVITAMIN WITH MINERALS) TABS tablet Take 1 tablet by mouth daily.     pantoprazole (PROTONIX) 20 MG tablet TAKE 1 TABLET DAILY 90 tablet 3   PANTOPRAZOLE SODIUM PO      STARCH-MALTO DEXTRIN (THICK-IT) PACK Take 1 each by mouth 4 (four) times daily as needed. Thicken liquids to honey consistency 200 each 0   furosemide (LASIX) 20 MG tablet (Prior Auth:  Rx DGU#:[id number])     levETIRAcetam (KEPPRA) 500 MG tablet Take 1 tablet (500 mg total) by mouth 2 (two) times daily. 180 tablet 1   losartan (COZAAR) 50 MG tablet (Prior Auth: Rx LOV#:[id number])     No facility-administered medications prior to visit.    PAST MEDICAL HISTORY: Past Medical History:  Diagnosis Date   Allergy    Asthma    sees Dr. Tiajuana Amass - as child per pt   At high risk for falls    unstable gait    Ataxia    Cataract    removed bilaterally    Diverticulosis    Emphysema    Gait abnormality 05/14/2013   KristineMuilenburg a patient of Dr. Linda Hedges presented first interpreter thousand 13 with a gait dysfunction, she also  had an episodic confusion and Loss device. Exam found a mild nystagmus and she was referred to ophthalmology on 03-2812, brain MRI was normal nystagmus was a secondary diagnosis to vestibulitis, ear nose and throat has followed and had seen the patient. At the time of the nystagmus the patie   GERD (gastroesophageal reflux disease)    Hepatic steatosis    Hyperlipidemia    on atorvastatin    Hypertension    on losartan    Iron deficiency anemia, unspecified    Kidney cysts 11/16/12   small cyst on left   Nystagmus, end-position    Osteopenia    Renal cyst    Stroke ([place]) 01/2017   mild     PAST SURGICAL HISTORY: Past Surgical History:  Procedure Laterality Date   ANAL RECTAL MANOMETRY N/A 12/20/2017   Procedure: ANO RECTAL MANOMETRY;  Surgeon: Mauri Pole, MD;  Location: [facility];  Service: Endoscopy;  Laterality: N/A;   BREAST BIOPSY Bilateral 1970's   CATARACT EXTRACTION Bilateral LT:02/14/13,RT:02/21/13   CERVICAL SPINE SURGERY  2010   COLONOSCOPY  03-12-14   per Dr. Olevia Perches, diverticulosis only, repeat 5 yrs    EYE SURGERY Bilateral 01/2013&02/2013   left then right   HERNIA REPAIR  Oct '14-left, remote-right   years ago right; left done '14   SKIN CANCER EXCISION  05/13/13   FACE, basal cell   TONSILLECTOMY      FAMILY HISTORY: Family History  Problem Relation Age of Onset   Pancreatic cancer Mother    Throat cancer Mother    Colon cancer Father    Coronary artery disease Father    Heart block Father    Ovarian cancer Maternal Aunt    Colon cancer Paternal Aunt        x 2   Cancer - Lung Paternal Aunt    Breast cancer Paternal Aunt    Colon cancer Paternal Aunt    Colon cancer Paternal Grandmother    Heart attack Paternal Grandfather    Esophageal cancer Other    Colon polyps Neg Hx    Rectal cancer Neg Hx    Stomach cancer Neg Hx    Dementia Neg Hx     SOCIAL HISTORY: Social History   Socioeconomic History   Marital status: Married    Spouse  name: Not on file   Number of children: 3   Years of education: 16   Highest education level: Not on file  Occupational History   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 married - 1965. 3 sons - '66, '69, '70. Sons in good health. Marriage in good health. Enjoys retirement - remains active.         Social Determinants of Health   Financial Resource Strain: Low Risk    Difficulty of Paying Living Expenses: Not hard at all  Food Insecurity: No Food Insecurity   Worried About Charity fundraiser in the Last Year: Never true   [place] in the Last Year: Never true  Transportation Needs: No Transportation Needs   Lack of Transportation (Medical): No   Lack of Transportation (Non-Medical): No  Physical Activity: Inactive   Days of Exercise per Week: 0 days   Minutes of Exercise per Session: 0 min  Stress: No Stress Concern Present   Feeling of Stress : Not at all  Social Connections: Moderately Integrated   Frequency of Communication with Friends and Family: More than three times a week   Frequency of Social Gatherings with Friends and Family: More than three times a week   Attends Religious Services: 1 to 4 times per year   Active Member of [employer] or Organizations: No   Attends Archivist Meetings: Never   Marital Status: Married  Human resources officer Violence: Not At Risk   Fear of Current or Ex-Partner: No   Emotionally Abused: No   Physically Abused: No   Sexually Abused: No      PHYSICAL EXAM  Vitals:   01/17/22 1100  BP: 108/68  Pulse: 61   Weight: 95 lb 12.8 oz (43.5 kg)  Height: 5' (1.524 m)   Body mass index is 18.71 kg/m.  MMSE - Mini Mental State Exam 01/17/2022 06/08/2021 09/18/2018  Not completed: - - (No Data)  Orientation to time 1 5 -  Orientation to Place 4 5 -  Registration 3 3 -  Attention/ Calculation 1 5 -  Recall 3 2 -  Language- name 2 objects 2 2 -  Language- repeat 0 1 -  Language- follow 3 step command 3 3 -  Language- read & follow direction 1 1 -  Write a sentence 0 1 -  Copy design 0 0 -  Total score 18 28 -     Generalized: Well developed, in no acute distress   Neurological examination  Mentation: Alert oriented to time, place, history taking. Follows all commands speech and language fluent Cranial nerve II-XII: Pupils were equal round reactive to light. Extraocular movements were full, visual field were full on confrontational test. Facial sensation and strength were normal.  Head turning and shoulder shrug  were normal and symmetric. Motor: The motor testing reveals 5 over 5 strength of all 4 extremities. Good symmetric motor tone is noted throughout.  Sensory: Sensory testing is intact to soft touch on all 4 extremities. No evidence of extinction is noted.  Coordination: Cerebellar testing reveals good finger-nose-finger and heel-to-shin bilaterally.  Gait and station: Patient is in a wheelchair today.  She does not have her walker not attempted. Reflexes: Deep tendon reflexes are symmetric and normal bilaterally.   DIAGNOSTIC DATA (LABS, IMAGING, TESTING) - I reviewed patient records, labs, notes, testing and  imaging myself where available.  Lab Results  Component Value Date   WBC 6.5 02/01/2021   HGB 10.1 (L) 02/01/2021   HCT 32.0 (L) 02/01/2021   MCV 83 02/01/2021   PLT 427 02/01/2021      Component Value Date/Time   NA 140 09/07/2021 1011   K 3.9 09/07/2021 1011   CL 101 09/07/2021 1011   CO2 22 09/07/2021 1011   GLUCOSE 93 09/07/2021 1011   GLUCOSE 96 12/30/2020 1350    BUN 11 09/07/2021 1011   CREATININE 0.64 09/07/2021 1011   CALCIUM 9.6 09/07/2021 1011   PROT 7.1 09/07/2021 1011   ALBUMIN 4.5 09/07/2021 1011   AST 18 09/07/2021 1011   ALT 8 09/07/2021 1011   ALKPHOS 76 09/07/2021 1011   BILITOT 1.3 (H) 09/07/2021 1011   GFRNONAA >60 12/21/2020 0509   GFRAA >60 01/26/2017 1640   Lab Results  Component Value Date   CHOL 202 (H) 04/15/2016   HDL 75.50 04/15/2016   LDLCALC 116 (H) 04/15/2016   LDLDIRECT 129.3 12/07/2010   TRIG 58 11/26/2020   CHOLHDL 3 04/15/2016   Lab Results  Component Value Date   HGBA1C 5.5 11/25/2020   Lab Results  Component Value Date   VITAMINB12 347 02/01/2021   Lab Results  Component Value Date   TSH 2.600 02/01/2021      ASSESSMENT AND PLAN 81 y.o. year old female  has a past medical history of Allergy, Asthma, At high risk for falls, Ataxia, Cataract, Diverticulosis, Emphysema, Gait abnormality (05/14/2013), GERD (gastroesophageal reflux disease), Hepatic steatosis, Hyperlipidemia, Hypertension, Iron deficiency anemia, unspecified, Kidney cysts (11/16/12), Nystagmus, end-position, Osteopenia, Renal cyst, and Stroke ([place]) (01/2017). here with :  Seizures  Continue Keppra 500 mg twice a day  2.  Memory disturbance  MMSE 18/30 previously 28/30 We discussed starting Namenda-provided the patient with a handout reviewing this medication she plans to discuss with her husband and they will call back if she wants to start this  Advised if symptoms worsen or she develops new symptoms that she will let us know follow-up in 6 months or sooner if needed     Ward Givens, MSN, NP-C 01/17/2022, 11:25 AM [facility] [address], [place], [place] [address] [phone]

## 2022-01-31 ENCOUNTER — Telehealth: Payer: Self-pay | Admitting: Adult Health

## 2022-01-31 MED ORDER — MEMANTINE HCL 28 X 5 MG & 21 X 10 MG PO TABS: ORAL_TABLET | ORAL | 0 refills | Status: DC

## 2022-01-31 NOTE — Telephone Encounter (Signed)
No answer

## 2022-01-31 NOTE — Telephone Encounter (Signed)
Pt's spouse has called to report pt is willing to take the nameda, please have it called it. ?

## 2022-01-31 NOTE — Telephone Encounter (Signed)
I sent in Namenda titration pack.  Please advise the patient's husband that they will need to call at the beginning of week for to get maintenance prescription. ? ?For the titration pack they will need to follow the directions on the pack. ?

## 2022-01-31 NOTE — Addendum Note (Signed)
Addended by: Trudie Buckler on: 01/31/2022 03:48 PM ? ? Modules accepted: Orders ? ?

## 2022-02-01 NOTE — Telephone Encounter (Signed)
I called pts husband and relayed that MM/NP did send in prescription for the namenda titration pack for his wife.  He will call back if cost a factor and then changes can be made for generic 5 mg tabs.  He verbalized understanding.  He will also call back if gets titration pack and when gets to last week of finishing that to call in maintenance dose prescription. ?

## 2022-02-10 ENCOUNTER — Other Ambulatory Visit: Payer: Self-pay | Admitting: Family Medicine

## 2022-02-28 ENCOUNTER — Other Ambulatory Visit: Payer: Self-pay | Admitting: Family Medicine

## 2022-03-04 ENCOUNTER — Ambulatory Visit (INDEPENDENT_AMBULATORY_CARE_PROVIDER_SITE_OTHER): Payer: Medicare Other | Admitting: Podiatry

## 2022-03-04 ENCOUNTER — Other Ambulatory Visit: Payer: Self-pay | Admitting: Adult Health

## 2022-03-04 ENCOUNTER — Encounter: Payer: Self-pay | Admitting: Podiatry

## 2022-03-04 DIAGNOSIS — B351 Tinea unguium: Secondary | ICD-10-CM | POA: Diagnosis not present

## 2022-03-04 DIAGNOSIS — M79674 Pain in right toe(s): Secondary | ICD-10-CM | POA: Diagnosis not present

## 2022-03-04 DIAGNOSIS — M79675 Pain in left toe(s): Secondary | ICD-10-CM | POA: Diagnosis not present

## 2022-03-06 ENCOUNTER — Other Ambulatory Visit: Payer: Self-pay | Admitting: Family Medicine

## 2022-03-10 DIAGNOSIS — Z20822 Contact with and (suspected) exposure to covid-19: Secondary | ICD-10-CM | POA: Diagnosis not present

## 2022-03-11 ENCOUNTER — Other Ambulatory Visit: Payer: Self-pay

## 2022-03-11 ENCOUNTER — Other Ambulatory Visit: Payer: Self-pay | Admitting: Family Medicine

## 2022-03-13 NOTE — Progress Notes (Signed)
?  Subjective:  ?Patient ID: Kristine Francis, female    DOB: December 07, 1940,  MRN: [id number] ? ?81 y.o. female presents with painful thick toenails that are difficult to trim. Pain interferes with ambulation. Aggravating factors include wearing enclosed shoe gear. Pain is relieved with periodic professional debridement. ? ?Patient is accompanied by her husband on today's visit.  ? ?PCP: [name], MD and last visit was: 08/05/2021 ? ?Review of Systems: Negative except as noted in the HPI.  ? ?Allergies  ?Allergen Reactions  ? Contrast Media [Iodinated Contrast Media] Other (See Comments)  ?  Congestion .Marland KitchenMarland Kitchenpatient stated it irritated her eyes and voice  ? Iohexol Other (See Comments)  ?  Gi upset  ? Lisinopril Cough  ? ? ?Objective:  ?There were no vitals filed for this visit. ?Constitutional Patient is a pleasant 81 y.o. Caucasian female thin build in NAD. AAO x 3.  ?Vascular Capillary fill time to digits immediate b/l.  DP/PT pulse(s) are palpable b/l lower extremities. Pedal hair absent. Lower extremity skin temperature gradient within normal limits. No pain with calf compression b/l. No edema noted b/l lower extremities. No cyanosis or clubbing noted.   ?Neurologic Protective sensation decreased with 10 gram monofilament b/l. Vibratory sensation decreased b/l.  ?Dermatologic Pedal skin is warm and supple b/l.  No open wounds b/l lower extremities. No interdigital macerations b/l lower extremities. Toenails 1-5 b/l elongated, discolored, dystrophic, thickened, crumbly with subungual debris and tenderness to dorsal palpation.   ?Orthopedic: Muscle strength 5/5 to all lower extremity muscle groups bilaterally. Hammertoe deformity noted 1-5 b/l. Utilizes wheelchair for mobility assistance.  ? ?Assessment:  ? ?1. Pain due to onychomycosis of toenails of both feet   ? ?Plan:  ?Patient was evaluated and treated and all questions answered. ?Consent given for treatment as described below: ?-Patient to continue soft,  supportive shoe gear daily. ?-Toenails 1-5 b/l were debrided in length and girth with sterile nail nippers and dremel without iatrogenic bleeding.  ?-Patient/POA to call should there be question/concern in the interim. ? ?Return in about 3 months (around 06/03/2022). ? ?Marzetta Board, DPM ?

## 2022-03-16 ENCOUNTER — Other Ambulatory Visit: Payer: Self-pay | Admitting: Adult Health

## 2022-03-16 MED ORDER — MEMANTINE HCL 10 MG PO TABS: 10.0000 mg | ORAL_TABLET | Freq: Two times a day (BID) | ORAL | 1 refills | Status: DC

## 2022-03-16 NOTE — Telephone Encounter (Signed)
Pt husband (on DPR) requesting refill of memantine (NAMENDA TITRATION PAK) tablet pack at [id number] ?Pt is now completely out of medication.  ?

## 2022-03-16 NOTE — Telephone Encounter (Signed)
Spoke with patient's husband, [name] and confirmed patient completed her titration pack in order and is now on 10 mg twice daily and tolerating.  I advised we would send in a prescription for this so the patient will take 1 tablet twice daily and follow-up in August. He verbalized appreciation.  ?

## 2022-03-18 DIAGNOSIS — Z20822 Contact with and (suspected) exposure to covid-19: Secondary | ICD-10-CM | POA: Diagnosis not present

## 2022-03-21 ENCOUNTER — Other Ambulatory Visit: Payer: Self-pay | Admitting: Neurology

## 2022-03-21 ENCOUNTER — Ambulatory Visit (INDEPENDENT_AMBULATORY_CARE_PROVIDER_SITE_OTHER): Payer: Medicare Other

## 2022-03-21 ENCOUNTER — Other Ambulatory Visit: Payer: Self-pay | Admitting: Family Medicine

## 2022-03-21 VITALS — Ht 60.0 in | Wt 95.0 lb

## 2022-03-21 DIAGNOSIS — Z0000 Encounter for general adult medical examination without abnormal findings: Secondary | ICD-10-CM

## 2022-03-21 NOTE — Patient Instructions (Addendum)
?Kristine Francis , ?Thank you for taking time to come for your Medicare Wellness Visit. I appreciate your ongoing commitment to your health goals. Please review the following plan we discussed and let me know if I can assist you in the future.  ? ?These are the goals we discussed: ? Goals   ? ?   Exercise 150 minutes per week (moderate activity) (pt-stated)   ?   May join the [facility] and try the silver sneaker program. ? ? ?  ?   Gain weight   ?   Will try to add calories to your diet.  ?Eat some nuts or cheese during the day  ?Will try you ensure meal replacement at [facility] every day  ? ?Here are some energy-dense foods that are perfect for gaining weight: ? ?Nuts: Almonds, walnuts, macadamia nuts, peanuts, etc. ?Dried fruit: Raisins, dates, prunes and others. ?High-fat dairy: Whole milk, full-fat yogurt, cheese, cream. ?Fats and oils: Extra virgin olive oil and avocado oil. ?Grains: Whole grains like oats and brown rice. ?Meat: Chicken, beef, pork, lamb, etc. Choose fattier cuts. ?Tubers: Potatoes, sweet potatoes and yams. ?Dark chocolate, avocados, peanut butter, coconut milk, granola, trail mixes. ? ?  ?   [facility]    ?   Start walking around or get a personal trainer  ? ?  ? ?  ?  ?This is a list of the screening recommended for you and due dates:  ?Health Maintenance  ?Topic Date Due  ? Hepatitis C Screening: USPSTF Recommendation to screen - Ages 12-79 yo.  Never done  ? COVID-19 Vaccine (3 - Booster for Pfizer series) 04/06/2022*  ? Zoster (Shingles) Vaccine (1 of 2) 06/21/2022*  ? Flu Shot  06/21/2022  ? Tetanus Vaccine  12/24/2022  ? Colon Cancer Screening  04/25/2024  ? Pneumonia Vaccine  Completed  ? DEXA scan (bone density measurement)  Completed  ? HPV Vaccine  Aged Out  ?*Topic was postponed. The date shown is not the original due date.  ?  ?Advanced directives: Yes ? ?Conditions/risks identified: None ? ?Next appointment: Follow up in one year for your annual wellness visit  ? ? ?Preventive Care 33  Years and Older, Female ?Preventive care refers to lifestyle choices and visits with your health care provider that can promote health and wellness. ?What does preventive care include? ?A yearly physical exam. This is also called an annual well check. ?Dental exams once or twice a year. ?Routine eye exams. Ask your health care provider how often you should have your eyes checked. ?Personal lifestyle choices, including: ?Daily care of your teeth and gums. ?Regular physical activity. ?Eating a healthy diet. ?Avoiding tobacco and drug use. ?Limiting alcohol use. ?Practicing safe sex. ?Taking low-dose aspirin every day. ?Taking vitamin and mineral supplements as recommended by your health care provider. ?What happens during an annual well check? ?The services and screenings done by your health care provider during your annual well check will depend on your age, overall health, lifestyle risk factors, and family history of disease. ?Counseling  ?Your health care provider may ask you questions about your: ?Alcohol use. ?Tobacco use. ?Drug use. ?Emotional well-being. ?Home and relationship well-being. ?Sexual activity. ?Eating habits. ?History of falls. ?Memory and ability to understand (cognition). ?Work and work Statistician. ?Reproductive health. ?Screening  ?You may have the following tests or measurements: ?Height, weight, and BMI. ?Blood pressure. ?Lipid and cholesterol levels. These may be checked every 5 years, or more frequently if you are over  51 years old. ?Skin check. ?Lung cancer screening. You may have this screening every year starting at age 69 if you have a 30-pack-year history of smoking and currently smoke or have quit within the past 15 years. ?Fecal occult blood test (FOBT) of the stool. You may have this test every year starting at age 33. ?Flexible sigmoidoscopy or colonoscopy. You may have a sigmoidoscopy every 5 years or a colonoscopy every 10 years starting at age 51. ?Hepatitis C blood  test. ?Hepatitis B blood test. ?Sexually transmitted disease (STD) testing. ?Diabetes screening. This is done by checking your blood sugar (glucose) after you have not eaten for a while (fasting). You may have this done every 1-3 years. ?Bone density scan. This is done to screen for osteoporosis. You may have this done starting at age 108. ?Mammogram. This may be done every 1-2 years. Talk to your health care provider about how often you should have regular mammograms. ?Talk with your health care provider about your test results, treatment options, and if necessary, the need for more tests. ?Vaccines  ?Your health care provider may recommend certain vaccines, such as: ?Influenza vaccine. This is recommended every year. ?Tetanus, diphtheria, and acellular pertussis (Tdap, Td) vaccine. You may need a Td booster every 10 years. ?Zoster vaccine. You may need this after age 28. ?Pneumococcal 13-valent conjugate (PCV13) vaccine. One dose is recommended after age 89. ?Pneumococcal polysaccharide (PPSV23) vaccine. One dose is recommended after age 13. ?Talk to your health care provider about which screenings and vaccines you need and how often you need them. ?This information is not intended to replace advice given to you by your health care provider. Make sure you discuss any questions you have with your health care provider. ?Document Released: 12/04/2015 Document Revised: 07/27/2016 Document Reviewed: 09/08/2015 ?Elsevier Interactive Patient Education ? 2017 [employer]. ? ?Fall Prevention in the Home ?Falls can cause injuries. They can happen to people of all ages. There are many things you can do to make your home safe and to help prevent falls. ?What can I do on the outside of my home? ?Regularly fix the edges of walkways and driveways and fix any cracks. ?Remove anything that might make you trip as you walk through a door, such as a raised step or threshold. ?Trim any bushes or trees on the path to your home. ?Use  bright outdoor lighting. ?Clear any walking paths of anything that might make someone trip, such as rocks or tools. ?Regularly check to see if handrails are loose or broken. Make sure that both sides of any steps have handrails. ?Any raised decks and porches should have guardrails on the edges. ?Have any leaves, snow, or ice cleared regularly. ?Use sand or salt on walking paths during winter. ?Clean up any spills in your garage right away. This includes oil or grease spills. ?What can I do in the bathroom? ?Use night lights. ?Install grab bars by the toilet and in the tub and shower. Do not use towel bars as grab bars. ?Use non-skid mats or decals in the tub or shower. ?If you need to sit down in the shower, use a plastic, non-slip stool. ?Keep the floor dry. Clean up any water that spills on the floor as soon as it happens. ?Remove soap buildup in the tub or shower regularly. ?Attach bath mats securely with double-sided non-slip rug tape. ?Do not have throw rugs and other things on the floor that can make you trip. ?What can I do in the bedroom? ?  Use night lights. ?Make sure that you have a light by your bed that is easy to reach. ?Do not use any sheets or blankets that are too big for your bed. They should not hang down onto the floor. ?Have a firm chair that has side arms. You can use this for support while you get dressed. ?Do not have throw rugs and other things on the floor that can make you trip. ?What can I do in the kitchen? ?Clean up any spills right away. ?Avoid walking on wet floors. ?Keep items that you use a lot in easy-to-reach places. ?If you need to reach something above you, use a strong step stool that has a grab bar. ?Keep electrical cords out of the way. ?Do not use floor polish or wax that makes floors slippery. If you must use wax, use non-skid floor wax. ?Do not have throw rugs and other things on the floor that can make you trip. ?What can I do with my stairs? ?Do not leave any items on the  stairs. ?Make sure that there are handrails on both sides of the stairs and use them. Fix handrails that are broken or loose. Make sure that handrails are as long as the stairways. ?Check any carpeting to make sure

## 2022-03-21 NOTE — Progress Notes (Signed)
? ?Subjective:  ? Kristine Francis is a 81 y.o. female who presents for Medicare Annual (Subsequent) preventive examination. ? ?Review of Systems    ?Virtual Visit via Telephone Note ? ?I connected with  Kristine Francis on 03/21/22 at  9:00 AM EDT by telephone and verified that I am speaking with the correct person using two identifiers. ? ?Location: ?Patient: Home ?Provider: Office ?Persons participating in the virtual visit: patient/Nurse Health Advisor ?  ?I discussed the limitations, risks, security and privacy concerns of performing an evaluation and management service by telephone and the availability of in person appointments. The patient expressed understanding and agreed to proceed. ? ?Interactive audio and video telecommunications were attempted between this nurse and patient, however failed, due to patient having technical difficulties OR patient did not have access to video capability.  We continued and completed visit with audio only. ? ?Some vital signs may be absent or patient reported.  ? ?Kristine Peaches, LPN  ?Cardiac Risk Factors include: advanced age (>16mn, >>14women);hypertension ? ?   ?Objective:  ?  ?Today's Vitals  ? 03/21/22 0906  ?Weight: 95 lb (43.1 kg)  ?Height: 5' (1.524 m)  ? ?Body mass index is 18.55 kg/m?. ? ? ?  03/21/2022  ?  9:17 AM 12/10/2020  ?  2:37 PM 12/26/2018  ? 11:58 AM 09/18/2018  ?  1:53 PM 01/02/2018  ?  3:41 PM 08/25/2017  ?  2:23 PM 01/26/2017  ?  4:20 PM  ?Advanced Directives  ?Does Patient Have a Medical Advance Directive? Yes No Yes Yes Yes Yes No  ?[place] of [place]    ?Does patient want to make changes to medical advance directive? No - Patient declined        ?Would patient like information on creating a medical advance directive?  No - Patient declined     No - Patient declined  ? ? ?Current Medications (verified) ?Outpatient Encounter Medications as of 03/21/2022  ?Medication Sig  ?  acetaminophen (TYLENOL) 325 MG tablet Take 1-2 tablets (325-650 mg total) by mouth every 4 (four) hours as needed for mild pain. (Patient taking differently: Take 325-650 mg by mouth as needed for mild pain.)  ? amLODipine (NORVASC) 2.5 MG tablet TAKE 1 TABLET BY MOUTH EVERY DAY  ? atorvastatin (LIPITOR) 10 MG tablet TAKE 1 TABLET DAILY  ? [facility] MELATONIN 3 MG TABS tablet TAKE 1 TABLET BY MOUTH AT BEDTIME.  ? ELIQUIS 5 MG TABS tablet TAKE 1 TABLET TWICE A DAY  ? EPINEPHrine 0.3 mg/0.3 mL IJ SOAJ injection Inject 0.3 mg into the muscle as needed for anaphylaxis.  ? furosemide (LASIX) 20 MG tablet (Prior Auth: Rx [id number]  ? ibuprofen (ADVIL) 800 MG tablet   ? levETIRAcetam (KEPPRA) 500 MG tablet Take 1 tablet (500 mg total) by mouth 2 (two) times daily.  ? losartan (COZAAR) 50 MG tablet (Prior Auth: Rx [id number]  ? memantine (NAMENDA) 10 MG tablet Take 1 tablet (10 mg total) by mouth 2 (two) times daily.  ? metoprolol tartrate (LOPRESSOR) 25 MG tablet TAKE 1 TABLET BY MOUTH 2 TIMES DAILY. *PHYSICAL REQUIRED FOR GREATER QUALITY OF REFILLS*  ? Multiple Vitamin (MULTIVITAMIN WITH MINERALS) TABS tablet Take 1 tablet by mouth daily.  ? pantoprazole (PROTONIX) 20 MG tablet TAKE 1 TABLET DAILY  ? PANTOPRAZOLE SODIUM PO   ? STARCH-MALTO DEXTRIN (THICK-IT) PACK Take 1 each by mouth 4 (four) times daily as  needed. Thicken liquids to honey consistency  ? ?No facility-administered encounter medications on file as of 03/21/2022.  ? ? ?Allergies (verified) ?Contrast media [iodinated contrast media], Iohexol, and Lisinopril  ? ?History: ?Past Medical History:  ?Diagnosis Date  ? Allergy   ? Asthma   ? sees Dr. Tiajuana Francis - as child per pt  ? At high risk for falls   ? unstable gait   ? Ataxia   ? Cataract   ? removed bilaterally   ? Diverticulosis   ? Emphysema   ? Gait abnormality 05/14/2013  ? Ms.Slaven a patient of Dr. Linda Francis presented first interpreter thousand 13 with a gait dysfunction, she also had an episodic  confusion and Loss device. Exam found a mild nystagmus and she was referred to ophthalmology on 03-2812, brain MRI was normal nystagmus was a secondary diagnosis to vestibulitis, ear nose and throat has followed and had seen the patient. At the time of the nystagmus the patie  ? GERD (gastroesophageal reflux disease)   ? Hepatic steatosis   ? Hyperlipidemia   ? on atorvastatin   ? Hypertension   ? on losartan   ? Iron deficiency anemia, unspecified   ? Kidney cysts 11/16/12  ? small cyst on left  ? Nystagmus, end-position   ? Osteopenia   ? Renal cyst   ? Stroke [facility]) 01/2017  ? mild   ? ?Past Surgical History:  ?Procedure Laterality Date  ? ANAL RECTAL MANOMETRY N/A 12/20/2017  ? Procedure: ANO RECTAL MANOMETRY;  Surgeon: Kristine Pole, MD;  Location: [facility];  Service: Endoscopy;  Laterality: N/A;  ? BREAST BIOPSY Bilateral 1970's  ? CATARACT EXTRACTION Bilateral LT:02/14/13,RT:02/21/13  ? CERVICAL SPINE SURGERY  2010  ? COLONOSCOPY  03-12-14  ? per Dr. Olevia Perches, diverticulosis only, repeat 5 yrs   ? EYE SURGERY Bilateral 01/2013&02/2013  ? left then right  ? HERNIA REPAIR  Oct '14-left, remote-right  ? years ago right; left done '14  ? SKIN CANCER EXCISION  05/13/13  ? FACE, basal cell  ? TONSILLECTOMY    ? ?Family History  ?Problem Relation Age of Onset  ? Pancreatic cancer Mother   ? Throat cancer Mother   ? Colon cancer Father   ? Coronary artery disease Father   ? Heart block Father   ? Ovarian cancer Maternal Aunt   ? Colon cancer Paternal Aunt   ?     x 2  ? Cancer - Lung Paternal Aunt   ? Breast cancer Paternal Aunt   ? Colon cancer Paternal Aunt   ? Colon cancer Paternal Grandmother   ? Heart attack Paternal Grandfather   ? Esophageal cancer Other   ? Colon polyps Neg Hx   ? Rectal cancer Neg Hx   ? Stomach cancer Neg Hx   ? Dementia Neg Hx   ? ?Social History  ? ?Socioeconomic History  ? Marital status: Married  ?  Spouse name: Not on file  ? Number of children: 3  ? Years of education: 61  ? Highest  education level: Not on file  ?Occupational History  ? 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  Comment: reitred  ?  Employer: RETIRED  ?Tobacco Use  ? Smoking status: Never  ? Smokeless tobacco: Never  ?Vaping Use  ? Vaping Use: Never used  ?Substance and Sexual Activity  ? Alcohol use: No  ?  Alcohol/week: 0.0 standard drinks  ? Drug use: No  ? Sexual activity: Yes  ?  Partners:  Male  ?Other Topics Concern  ? Not on file  ?Social History Narrative  ? 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 married - 1965. 3 sons - '66, '69, '70. Sons in good health. Marriage in good health. Enjoys retirement - remains active.  ?   ?   ? ?Social Determinants of Health  ? ?Financial Resource Strain: Low Risk   ? Difficulty of Paying Living Expenses: Not hard at all  ?Food Insecurity: No Food Insecurity  ? Worried About Charity fundraiser in the Last Year: Never true  ? Ran Out of Food in the Last Year: Never true  ?Transportation Needs: No Transportation Needs  ? Lack of Transportation (Medical): No  ? Lack of Transportation (Non-Medical): No  ?Physical Activity: Inactive  ? Days of Exercise per Week: 0 days  ? Minutes of Exercise per Session: 0 min  ?Stress: No Stress Concern Present  ? Feeling of Stress : Not at all  ?Social Connections: Socially Integrated  ? Frequency of Communication with Friends and Family: More than three times a week  ? Frequency of Social Gatherings with Friends and Family: More than three times a week  ? Attends Religious Services: More than 4 times per year  ? Active Member of Clubs or Organizations: Yes  ? Attends Archivist Meetings: More than 4 times per year  ? Marital Status: Married  ? ? ? ?Clinical Intake: ? ? ?Diabetic? No ? ? ?Activities of Daily Living ? ?  03/21/2022  ?  9:14 AM  ?In your present state of health, do you have any difficulty performing the following activities:  ?Hearing? 0  ?Vision? 0  ?Difficulty concentrating or making decisions? 0  ?Walking or  climbing stairs? 0  ?Dressing or bathing? 0  ?Doing errands, shopping? 1  ?Comment Husband Assist  ?Preparing Food and eating ? N  ?Using the Toilet? N  ?In the past six months, have you accidently leaked urine? Darreld Mclean

## 2022-03-26 ENCOUNTER — Other Ambulatory Visit: Payer: Self-pay | Admitting: Family Medicine

## 2022-03-26 DIAGNOSIS — Z20822 Contact with and (suspected) exposure to covid-19: Secondary | ICD-10-CM | POA: Diagnosis not present

## 2022-03-28 NOTE — Telephone Encounter (Signed)
Pt needs appointment for further refills 

## 2022-04-02 ENCOUNTER — Other Ambulatory Visit: Payer: Self-pay | Admitting: Family Medicine

## 2022-04-02 DIAGNOSIS — I10 Essential (primary) hypertension: Secondary | ICD-10-CM

## 2022-04-03 ENCOUNTER — Other Ambulatory Visit: Payer: Self-pay

## 2022-04-03 ENCOUNTER — Encounter (HOSPITAL_COMMUNITY): Payer: Self-pay

## 2022-04-03 ENCOUNTER — Emergency Department (HOSPITAL_COMMUNITY)
Admission: EM | Admit: 2022-04-03 | Discharge: 2022-04-04 | Discharge status: Against Medical Advice | Payer: Medicare Other | Attending: Physician Assistant | Admitting: Physician Assistant

## 2022-04-03 DIAGNOSIS — R1110 Vomiting, unspecified: Secondary | ICD-10-CM | POA: Insufficient documentation

## 2022-04-03 DIAGNOSIS — I10 Essential (primary) hypertension: Secondary | ICD-10-CM | POA: Diagnosis not present

## 2022-04-03 DIAGNOSIS — R140 Abdominal distension (gaseous): Secondary | ICD-10-CM | POA: Diagnosis not present

## 2022-04-03 DIAGNOSIS — Z5321 Procedure and treatment not carried out due to patient leaving prior to being seen by health care provider: Secondary | ICD-10-CM | POA: Insufficient documentation

## 2022-04-03 DIAGNOSIS — R0902 Hypoxemia: Secondary | ICD-10-CM | POA: Diagnosis not present

## 2022-04-03 DIAGNOSIS — R1084 Generalized abdominal pain: Secondary | ICD-10-CM | POA: Diagnosis not present

## 2022-04-03 DIAGNOSIS — R109 Unspecified abdominal pain: Secondary | ICD-10-CM | POA: Diagnosis not present

## 2022-04-03 LAB — COMPREHENSIVE METABOLIC PANEL
ALT: 12 U/L (ref 0–44)
AST: 23 U/L (ref 15–41)
Albumin: 4 g/dL (ref 3.5–5.0)
Alkaline Phosphatase: 66 U/L (ref 38–126)
Anion gap: 11 (ref 5–15)
BUN: 17 mg/dL (ref 8–23)
CO2: 25 mmol/L (ref 22–32)
Calcium: 9.7 mg/dL (ref 8.9–10.3)
Chloride: 103 mmol/L (ref 98–111)
Creatinine, Ser: 0.81 mg/dL (ref 0.44–1.00)
GFR, Estimated: >60 mL/min (ref >60)
Glucose, Bld: 127 mg/dL — ABNORMAL HIGH (ref 70–99)
Potassium: 4.5 mmol/L (ref 3.5–5.1)
Sodium: 139 mmol/L (ref 135–145)
Total Bilirubin: 1.4 mg/dL — ABNORMAL HIGH (ref 0.3–1.2)
Total Protein: 7.1 g/dL (ref 6.5–8.1)

## 2022-04-03 LAB — TROPONIN I (HIGH SENSITIVITY)
Troponin I (High Sensitivity): 5 ng/L (ref <18)
Troponin I (High Sensitivity): 5 ng/L (ref <18)

## 2022-04-03 LAB — CBC WITH DIFFERENTIAL/PLATELET
Abs Immature Granulocytes: 0.07 10*3/uL (ref 0.00–0.07)
Basophils Absolute: 0.1 10*3/uL (ref 0.0–0.1)
Basophils Relative: 0 %
Eosinophils Absolute: 0.1 10*3/uL (ref 0.0–0.5)
Eosinophils Relative: 1 %
HCT: 39.9 % (ref 36.0–46.0)
Hemoglobin: 12.4 g/dL (ref 12.0–15.0)
Immature Granulocytes: 1 %
Lymphocytes Relative: 8 %
Lymphs Abs: 1.2 10*3/uL (ref 0.7–4.0)
MCH: 27 pg (ref 26.0–34.0)
MCHC: 31.1 g/dL (ref 30.0–36.0)
MCV: 86.7 fL (ref 80.0–100.0)
Monocytes Absolute: 0.9 10*3/uL (ref 0.1–1.0)
Monocytes Relative: 6 %
Neutro Abs: 11.9 10*3/uL — ABNORMAL HIGH (ref 1.7–7.7)
Neutrophils Relative %: 84 %
Platelets: 314 10*3/uL (ref 150–400)
RBC: 4.6 MIL/uL (ref 3.87–5.11)
RDW: 16 % — ABNORMAL HIGH (ref 11.5–15.5)
WBC: 14.1 10*3/uL — ABNORMAL HIGH (ref 4.0–10.5)
nRBC: 0 % (ref 0.0–0.2)

## 2022-04-03 LAB — LIPASE, BLOOD: Lipase: 39 U/L (ref 11–51)

## 2022-04-03 NOTE — ED Provider Triage Note (Signed)
Emergency Medicine Provider Triage Evaluation Note ? ?Kristine Francis , a 81 y.o. female  was evaluated in triage.  Pt complains of denies any complaints. ?EMS reports that her husband called for abdominal pain.  Patient denies any vomiting however had an episode of vomiting with EMS. ? ? ?Physical Exam  ?BP (!) 162/92   Pulse 68   Temp 98.2 ?F (36.8 ?C) (Oral)   SpO2 98%  ?Gen:   Awake, no distress   ?Resp:  Normal effort  ?MSK:   Moves extremities without difficulty  ?Other:  With palpation of abdomen it is firm and patient reports pain with palpation.  ? ?Medical Decision Making  ?Medically screening exam initiated at 6:25 PM.  Appropriate orders placed.  Kristine Francis was informed that the remainder of the evaluation will be completed by another provider, this initial triage assessment does not replace that evaluation, and the importance of remaining in the ED until their evaluation is complete. ? ?Patient has contrast listed as an allergy.   ?  ?Lorin Glass, PA-C ?04/03/22 1825 ? ?

## 2022-04-03 NOTE — ED Triage Notes (Addendum)
Pt BIB [facility] from home c/o abdominal pain that started 30 mins prior to arrival. Per EMS pt's abdomen is distended. Pt had one episode of vomiting with EMS. Pt denies all of EMS complaints but husband is on the way per EMS.  ?

## 2022-04-04 ENCOUNTER — Encounter: Payer: Self-pay | Admitting: Family Medicine

## 2022-04-04 ENCOUNTER — Ambulatory Visit (INDEPENDENT_AMBULATORY_CARE_PROVIDER_SITE_OTHER): Payer: Medicare Other | Admitting: Family Medicine

## 2022-04-04 VITALS — BP 112/68 | HR 68 | Temp 98.7°F

## 2022-04-04 DIAGNOSIS — Z8719 Personal history of other diseases of the digestive system: Secondary | ICD-10-CM | POA: Diagnosis not present

## 2022-04-04 DIAGNOSIS — R1030 Lower abdominal pain, unspecified: Secondary | ICD-10-CM | POA: Diagnosis not present

## 2022-04-04 MED ORDER — METRONIDAZOLE 500 MG PO TABS: 500.0000 mg | ORAL_TABLET | Freq: Three times a day (TID) | ORAL | 0 refills | Status: AC

## 2022-04-04 MED ORDER — CIPROFLOXACIN HCL 500 MG PO TABS: 500.0000 mg | ORAL_TABLET | Freq: Two times a day (BID) | ORAL | 0 refills | Status: AC

## 2022-04-04 NOTE — ED Notes (Signed)
Pt seen leaving ED with family member.  ?

## 2022-04-04 NOTE — ED Notes (Addendum)
Pt family member requesting IV to be removed to leave due to wait time. IV removed.  ?

## 2022-04-04 NOTE — ED Notes (Signed)
Pt stating that she is going to wait a little while longer before going to another hospital.  ?

## 2022-04-04 NOTE — Progress Notes (Signed)
Subjective:  ? ? Patient ID: Kristine Francis, female    DOB: 06/14/1941, 81 y.o.   MRN: [id number] ? ?Chief Complaint  ?Patient presents with  ? Abdominal Pain  ?  Lower abdomen pain, started yesterday. Dull pain, did not take anything for the pain. Comes and goes, has not had pain today   ?Patient accompanied by her husband. ? ?HPI ?Patient is a 81 yo female with pmh sig for protein calorie malnutrition, Luetscher's syndrome, HLD, hyponatremia, grand mal seizures, h/o cataracts s/p removal, anemia, ataxia, GERD, osteopenia, asthma/COPD, allergies, A-fib, h/o CVA, h/o cardiac arrest, HTN who was seen today for acute concern.  Pt with abd pain x 1 day.  Started yesterday am, preventing pt from going to church.   Pain, abd bloating, and gas continued that afternoon.  EMS called.  Pt went to [facility] ED at 3 pm and left at 2:30 am after waiting to be seen.  WBC 14.1 in ED.  Pt states abdominal pain has resolved today.  Pt has yet to eat anything today.  Endorses needed to drink more water.  Pt denies fever, chills, nausea, vomiting, dysuria, constipation, diarrhea. ? ?Per chart review patient with h/o diverticulosis.  Pt's husband states her symptoms seem similar to an episode of diverticulitis that he had. ? ?Past Medical History:  ?Diagnosis Date  ? Allergy   ? Asthma   ? sees Dr. Tiajuana Amass - as child per pt  ? At high risk for falls   ? unstable gait   ? Ataxia   ? Cataract   ? removed bilaterally   ? Diverticulosis   ? Emphysema   ? Gait abnormality 05/14/2013  ? Ms.Osorto a patient of Dr. Linda Hedges presented first interpreter thousand 13 with a gait dysfunction, she also had an episodic confusion and Loss device. Exam found a mild nystagmus and she was referred to ophthalmology on 03-2812, brain MRI was normal nystagmus was a secondary diagnosis to vestibulitis, ear nose and throat has followed and had seen the patient. At the time of the nystagmus the patie  ? GERD (gastroesophageal reflux disease)   ? Hepatic steatosis   ?  Hyperlipidemia   ? on atorvastatin   ? Hypertension   ? on losartan   ? Iron deficiency anemia, unspecified   ? Kidney cysts 11/16/12  ? small cyst on left  ? Nystagmus, end-position   ? Osteopenia   ? Renal cyst   ? Stroke [facility]) 01/2017  ? mild   ? ? ?Allergies  ?Allergen Reactions  ? Contrast Media [Iodinated Contrast Media] Other (See Comments)  ?  Congestion .Marland KitchenMarland Kitchenpatient stated it irritated her eyes and voice  ? Iohexol Other (See Comments)  ?  Gi upset  ? Lisinopril Cough  ? ? ?ROS ?General: Denies fever, chills, night sweats, changes in weight, changes in appetite ?HEENT: Denies headaches, ear pain, changes in vision, rhinorrhea, sore throat ?CV: Denies CP, palpitations, SOB, orthopnea ?Pulm: Denies SOB, cough, wheezing ?GI: Denies abdominal pain, nausea, vomiting, diarrhea, constipation +abd pain ?GU: Denies dysuria, hematuria, frequency, vaginal discharge ?Msk: Denies muscle cramps, joint pains ?Neuro: Denies weakness, numbness, tingling ?Skin: Denies rashes, bruising ?Psych: Denies depression, anxiety, hallucinations ? ?   ?Objective:  ?  ?Blood pressure 112/68, pulse 68, temperature 98.7 ?F (37.1 ?C), temperature source Oral, SpO2 96 %. ? ?Gen. Pleasant, well-nourished, in no distress, normal affect   ?HEENT: [place]/AT, face symmetric, conjunctiva clear, no scleral icterus, PERRLA, EOMI, nares patent without drainage. ?Lungs: no accessory  muscle use, CTAB, no wheezes or rales ?Cardiovascular: RRR, no m/r/g, no peripheral edema ?Abdomen: BS present, soft, NT/ND, no hepatosplenomegaly. ?Musculoskeletal: No deformities, no cyanosis or clubbing, normal tone ?Neuro:  A&Ox3, CN II-XII intact, sitting in transport wheelchair ?Skin:  Warm, no lesions/ rash ? ? ?Wt Readings from Last 3 Encounters:  ?03/21/22 95 lb (43.1 kg)  ?01/17/22 95 lb 12.8 oz (43.5 kg)  ?06/08/21 92 lb 12.8 oz (42.1 kg)  ? ? ?Lab Results  ?Component Value Date  ? WBC 14.1 (H) 04/03/2022  ? HGB 12.4 04/03/2022  ? HCT 39.9 04/03/2022  ? PLT 314  04/03/2022  ? GLUCOSE 127 (H) 04/03/2022  ? CHOL 202 (H) 04/15/2016  ? TRIG 58 11/26/2020  ? HDL 75.50 04/15/2016  ? LDLDIRECT 129.3 12/07/2010  ? LDLCALC 116 (H) 04/15/2016  ? ALT 12 04/03/2022  ? AST 23 04/03/2022  ? NA 139 04/03/2022  ? K 4.5 04/03/2022  ? CL 103 04/03/2022  ? CREATININE 0.81 04/03/2022  ? BUN 17 04/03/2022  ? CO2 25 04/03/2022  ? TSH 2.600 02/01/2021  ? INR 1.0 11/25/2020  ? HGBA1C 5.5 11/25/2020  ? ? ?Assessment/Plan: ? ?Lower abdominal pain  ?-resolved ?-Labs and notes from ED visit 04/03/2022 reviewed.  White count mildly elevated at 14.1. ?-Discussed possible causes of symptoms including diverticulitis given history of diverticulosis. ?-will have patient try eating bland foods today and advance diet as tolerated. ?-Also encouraged to increase p.o. hydration ?-Given wait-and-see Rx for Flagyl and Cipro.  Advised to start prescription if abdominal pain returns. ?- Plan: metroNIDAZOLE (FLAGYL) 500 MG tablet, ciprofloxacin (CIPRO) 500 MG tablet ? ?History of diverticulosis ?-Noted per chart review ?-Concern patient may have had episode of diverticulitis that resolved in ED as patient was n.p.o. and given fluids. ?-Patient will try bland foods today and advance diet as tolerated. ?-If symptoms return start ABX and notify clinic. ? - Plan: metroNIDAZOLE (FLAGYL) 500 MG tablet, ciprofloxacin (CIPRO) 500 MG tablet ? ?F/u prn. ? ?Grier Mitts, MD ?

## 2022-04-04 NOTE — Patient Instructions (Addendum)
Try eating bland foods. ? ?Your white blood cell count was mildly elevated at 14.1 while you are in the emergency department.  Given your history of diverticulosis it is likely that you may have had an episode of diverticulitis.  This is just inflammation of the little outpouchings in your intestines.  A prescription for Flagyl and ciprofloxacin was sent to your pharmacy in the event your symptoms return.  Try eating bland foods.  If your pain returns you can start the prescription for antibiotics. ?

## 2022-04-04 NOTE — Progress Notes (Signed)
Pt came in with concerns about medications, informed that PCP would be better able to answer those questions. Offered to get her on the schedule with PCP, pt agreed. While speaking with PCP, about the concerns about the namenda and lipitor, Dr Sarajane Jews stated pt could stop taking the lipitor. Informed pt of what PCP advised, pt verbalized understanding. ?

## 2022-04-23 ENCOUNTER — Other Ambulatory Visit: Payer: Self-pay | Admitting: Family Medicine

## 2022-04-30 ENCOUNTER — Other Ambulatory Visit: Payer: Self-pay | Admitting: Family Medicine

## 2022-04-30 DIAGNOSIS — I10 Essential (primary) hypertension: Secondary | ICD-10-CM

## 2022-05-20 ENCOUNTER — Other Ambulatory Visit: Payer: Self-pay | Admitting: Family Medicine

## 2022-05-20 DIAGNOSIS — I10 Essential (primary) hypertension: Secondary | ICD-10-CM

## 2022-06-01 ENCOUNTER — Other Ambulatory Visit: Payer: Self-pay | Admitting: Family Medicine

## 2022-06-01 DIAGNOSIS — I10 Essential (primary) hypertension: Secondary | ICD-10-CM

## 2022-06-07 ENCOUNTER — Ambulatory Visit: Payer: Medicare Other | Admitting: Podiatry

## 2022-06-11 ENCOUNTER — Other Ambulatory Visit: Payer: Self-pay | Admitting: Family Medicine

## 2022-06-27 ENCOUNTER — Encounter: Payer: Self-pay | Admitting: Podiatry

## 2022-06-27 ENCOUNTER — Ambulatory Visit (INDEPENDENT_AMBULATORY_CARE_PROVIDER_SITE_OTHER): Payer: Medicare Other | Admitting: Podiatry

## 2022-06-27 DIAGNOSIS — B351 Tinea unguium: Secondary | ICD-10-CM | POA: Diagnosis not present

## 2022-06-27 DIAGNOSIS — M79674 Pain in right toe(s): Secondary | ICD-10-CM | POA: Diagnosis not present

## 2022-06-27 DIAGNOSIS — M79675 Pain in left toe(s): Secondary | ICD-10-CM | POA: Diagnosis not present

## 2022-07-03 NOTE — Progress Notes (Signed)
  Subjective:  Patient ID: Kristine Francis, female    DOB: [birth year],  MRN: [id number]  [name] presents to clinic today for painful elongated mycotic toenails 1-5 bilaterally which are tender when wearing enclosed shoe gear. Pain is relieved with periodic professional debridement.  Patient is accompanied by her husband on today's visit.  New problem(s): None.   PCP is [name], MD.  Allergies  Allergen Reactions   Contrast Media [Iodinated Contrast Media] Other (See Comments)    Congestion .Marland KitchenMarland Kitchenpatient stated it irritated her eyes and voice   Iohexol Other (See Comments)    Gi upset   Lisinopril Cough    Review of Systems: Negative except as noted in the HPI.  Objective: No changes noted in today's physical examination. Constitutional Patient is a pleasant 81 y.o. Caucasian female thin build in NAD. AAO x 3.  Vascular Capillary fill time to digits immediate b/l.  DP/PT pulse(s) are palpable b/l lower extremities. Pedal hair absent. Lower extremity skin temperature gradient within normal limits. No pain with calf compression b/l. No edema noted b/l lower extremities. No cyanosis or clubbing noted.   Neurologic Protective sensation decreased with 10 gram monofilament b/l. Vibratory sensation decreased b/l.  Dermatologic Pedal skin is warm and supple b/l.  No open wounds b/l lower extremities. No interdigital macerations b/l lower extremities. Toenails 1-5 b/l elongated, discolored, dystrophic, thickened, crumbly with subungual debris and tenderness to dorsal palpation.   Orthopedic: Muscle strength 5/5 to all lower extremity muscle groups bilaterally. Hammertoe deformity noted 1-5 b/l. Utilizes wheelchair for mobility assistance.   Assessment/Plan: 1. Pain due to onychomycosis of toenails of both feet    -Patient's family member present. All questions/concerns addressed on today's visit. -Examined patient. -Mycotic toenails 1-5 bilaterally were debrided in length and  girth with sterile nail nippers and dremel without incident. -Patient/POA to call should there be question/concern in the interim.   Return in about 3 months (around 09/27/2022).  Marzetta Board, DPM

## 2022-07-18 ENCOUNTER — Ambulatory Visit: Payer: Medicare Other | Admitting: Adult Health

## 2022-07-26 ENCOUNTER — Ambulatory Visit (INDEPENDENT_AMBULATORY_CARE_PROVIDER_SITE_OTHER): Payer: Medicare Other | Admitting: Adult Health

## 2022-07-26 ENCOUNTER — Encounter: Payer: Self-pay | Admitting: Adult Health

## 2022-07-26 VITALS — BP 103/65 | HR 58 | Ht 60.0 in | Wt 89.6 lb

## 2022-07-26 DIAGNOSIS — G40409 Other generalized epilepsy and epileptic syndromes, not intractable, without status epilepticus: Secondary | ICD-10-CM | POA: Diagnosis not present

## 2022-07-26 DIAGNOSIS — R413 Other amnesia: Secondary | ICD-10-CM | POA: Diagnosis not present

## 2022-07-26 NOTE — Progress Notes (Signed)
PATIENT: Kristine Francis DOB: 28-Jul-1941  REASON FOR VISIT: follow up HISTORY FROM: patient PRIMARY NEUROLOGIST: Dr. [name]  Chief Complaint  Patient presents with   Follow-up    Rm 19, stable.  No worsening.       HISTORY OF PRESENT ILLNESS: Today 07/26/22: Kristine Francis is an 81 year old female with a history of memory disturbance and seizures.  She returns today for follow-up.  Overall she feels that she has remained stable.  Denies any seizure events.  Continues on Keppra 500 mg twice a day.  She lives at home with her husband.  She is able to complete all ADLs independently. Husband manages medication and appointments.  She remains on Namenda 10 mg twice a day.  She returns today for an evaluation.  01/17/22: Kristine Francis is a 81 year old female with a history of stroke, memory disturbance and seizures.  She returns today for follow-up.  She denies any seizure events.  Continues on Keppra 500 mg twice a day.  She feels that overall her symptoms have remained stable.  She lives at home with her husband.  Able to complete all ADLs independently.  Her husband manages her medications and appointments.  She does not operate a motor vehicle.  She is currently not on any medication for her memory.  She returns today for evaluation.  HISTORY 06/08/21:   Kristine Francis is a 81 year old female with a history of stroke and seizures.  She returns today for follow-up.  She remains on Keppra 500 mg twice a day.  She denies any seizure events.  She is able to complete all ADLs independently.  She lives with her husband.  Reports that she is sometimes forgetful but overall feels that her memory has remained stable.  Her husband now manages all the finances.  She manages her appointments and medications however her husband does fill the pillbox for her.  She continues to have ongoing trouble with her gait.  Has been diagnosed in the past by [facility] with cerebellar ataxia.  She reports that she typically has  falls inside her home.  She does not use a walker inside her home.  Fortunately she has not had any severe injuries.  She does use a walker or wheelchair outside of the home.  She returns today for an evaluation  REVIEW OF SYSTEMS: Out of a complete 14 system review of symptoms, the patient complains only of the following symptoms, and all other reviewed systems are negative.  ALLERGIES: Allergies  Allergen Reactions   Contrast Media [Iodinated Contrast Media] Other (See Comments)    Congestion .Marland KitchenMarland Kitchenpatient stated it irritated her eyes and voice   Iohexol Other (See Comments)    Gi upset   Lisinopril Cough    HOME MEDICATIONS: Outpatient Medications Prior to Visit  Medication Sig Dispense Refill   acetaminophen (TYLENOL) 325 MG tablet Take 1-2 tablets (325-650 mg total) by mouth every 4 (four) hours as needed for mild pain. (Patient taking differently: Take 325-650 mg by mouth as needed for mild pain.)     amLODipine (NORVASC) 2.5 MG tablet TAKE 1 TABLET BY MOUTH EVERY DAY 90 tablet 0   [facility] MELATONIN 3 MG TABS tablet TAKE 1 TABLET BY MOUTH EVERYDAY AT BEDTIME 200 tablet 0   ELIQUIS 5 MG TABS tablet TAKE 1 TABLET TWICE A DAY 180 tablet 3   EPINEPHrine 0.3 mg/0.3 mL IJ SOAJ injection Inject 0.3 mg into the muscle as needed for anaphylaxis.     furosemide (LASIX) 20  MG tablet (Prior Auth: Rx [id number])     ibuprofen (ADVIL) 800 MG tablet      levETIRAcetam (KEPPRA) 500 MG tablet TAKE 1 TABLET BY MOUTH TWICE A DAY 180 tablet 3   losartan (COZAAR) 50 MG tablet (Prior Auth: Rx OQH#:[id number])     memantine (NAMENDA) 10 MG tablet Take 1 tablet (10 mg total) by mouth 2 (two) times daily. 180 tablet 1   metoprolol tartrate (LOPRESSOR) 25 MG tablet TAKE 1 TABLET BY MOUTH TWICE A DAY 60 tablet 1   Multiple Vitamin (MULTIVITAMIN WITH MINERALS) TABS tablet Take 1 tablet by mouth daily.     pantoprazole (PROTONIX) 20 MG tablet TAKE 1 TABLET BY MOUTH EVERY DAY 90 tablet 3   PANTOPRAZOLE  SODIUM PO      STARCH-MALTO DEXTRIN (THICK-IT) PACK Take 1 each by mouth 4 (four) times daily as needed. Thicken liquids to honey consistency 200 each 0   atorvastatin (LIPITOR) 10 MG tablet TAKE 1 TABLET DAILY 90 tablet 0   No facility-administered medications prior to visit.    PAST MEDICAL HISTORY: Past Medical History:  Diagnosis Date   Allergy    Asthma    sees Dr. Tiajuana Amass - as child per pt   At high risk for falls    unstable gait    Ataxia    Cataract    removed bilaterally    Diverticulosis    Emphysema    Gait abnormality 05/14/2013   Kristine Francis a patient of Dr. Linda Hedges presented first interpreter thousand 13 with a gait dysfunction, she also had an episodic confusion and Loss device. Exam found a mild nystagmus and she was referred to ophthalmology on 03-2812, brain MRI was normal nystagmus was a secondary diagnosis to vestibulitis, ear nose and throat has followed and had seen the patient. At the time of the nystagmus the patie   GERD (gastroesophageal reflux disease)    Hepatic steatosis    Hyperlipidemia    on atorvastatin    Hypertension    on losartan    Iron deficiency anemia, unspecified    Kidney cysts 11/16/12   small cyst on left   Nystagmus, end-position    Osteopenia    Renal cyst    Stroke ([place]) 01/2017   mild     PAST SURGICAL HISTORY: Past Surgical History:  Procedure Laterality Date   ANAL RECTAL MANOMETRY N/A 12/20/2017   Procedure: ANO RECTAL MANOMETRY;  Surgeon: Mauri Pole, MD;  Location: [facility];  Service: Endoscopy;  Laterality: N/A;   BREAST BIOPSY Bilateral 1970's   CATARACT EXTRACTION Bilateral LT:02/14/13,RT:02/21/13   CERVICAL SPINE SURGERY  2010   COLONOSCOPY  03-12-14   per Dr. Olevia Perches, diverticulosis only, repeat 5 yrs    EYE SURGERY Bilateral 01/2013&02/2013   left then right   HERNIA REPAIR  Oct '14-left, remote-right   years ago right; left done '14   SKIN CANCER EXCISION  05/13/13   FACE, basal cell   TONSILLECTOMY       FAMILY HISTORY: Family History  Problem Relation Age of Onset   Pancreatic cancer Mother    Throat cancer Mother    Colon cancer Father    Coronary artery disease Father    Heart block Father    Ovarian cancer Maternal Aunt    Colon cancer Paternal Aunt        x 2   Cancer - Lung Paternal Aunt    Breast cancer Paternal Aunt    Colon cancer Paternal Aunt  Colon cancer Paternal Grandmother    Heart attack Paternal Grandfather    Esophageal cancer Other    Colon polyps Neg Hx    Rectal cancer Neg Hx    Stomach cancer Neg Hx    Dementia Neg Hx     SOCIAL HISTORY: Social History   Socioeconomic History   Marital status: Married    Spouse name: Not on file   Number of children: 3   Years of education: 16   Highest education level: Not on file  Occupational History   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 married - 1965. 3 sons - '66, '69, '70. Sons in good health. Marriage in good health. Enjoys retirement - remains active.         Social Determinants of Health   Financial Resource Strain: Low Risk  (03/21/2022)   Overall Financial Resource Strain (CARDIA)    Difficulty of Paying Living Expenses: Not hard at all  Food Insecurity: No Food Insecurity (03/21/2022)   Hunger Vital Sign    Worried About Running Out of Food in the Last Year: Never true    Ran Out of Food in the Last Year: Never true  Transportation Needs: No Transportation Needs (03/21/2022)   PRAPARE - Hydrologist (Medical): No    Lack of Transportation  (Non-Medical): No  Physical Activity: Inactive (03/21/2022)   Exercise Vital Sign    Days of Exercise per Week: 0 days    Minutes of Exercise per Session: 0 min  Stress: No Stress Concern Present (03/21/2022)   [place]    Feeling of Stress : Not at all  Social Connections: [place] (03/21/2022)   Social Connection and Isolation Panel [NHANES]    Frequency of Communication with Friends and Family: More than three times a week    Frequency of Social Gatherings with Friends and Family: More than three times a week    Attends Religious Services: More than 4 times per year    Active Member of [employer] or Organizations: Yes    Attends Music therapist: More than 4 times per year    Marital Status: Married  Human resources officer Violence: Not At Risk (03/21/2022)   Humiliation, Afraid, Rape, and Kick questionnaire    Fear of Current or Ex-Partner: No    Emotionally Abused: No    Physically Abused: No    Sexually Abused: No      PHYSICAL EXAM  There were no vitals filed for this visit.  There is no height or weight on file to calculate BMI.     07/26/2022    1:10 PM 01/17/2022   11:04 AM 06/08/2021    9:14 AM  MMSE - Mini Mental State Exam  Orientation to time '5 1 5  '$ Orientation to Place '4 4 5  '$ Registration '3 3 3  '$ Attention/ Calculation '1 1 5  '$ Recall '3 3 2  '$ Language- name  2 objects '2 2 2  '$ Language- repeat 1 0 1  Language- follow 3 step command '3 3 3  '$ Language- read & follow direction '1 1 1  '$ Write a sentence 1 0 1  Copy design 0 0 0  Total score '24 18 28     '$ Generalized: Well developed, in no acute distress   Neurological examination  Mentation: Alert oriented to time, place, history taking. Follows all commands speech and language fluent Cranial nerve II-XII: Pupils were equal round reactive to light. Extraocular movements were full, visual field were full on confrontational test. Facial  sensation and strength were normal.  Head turning and shoulder shrug  were normal and symmetric. Motor: The motor testing reveals 5 over 5 strength of all 4 extremities. Good symmetric motor tone is noted throughout.  Sensory: Sensory testing is intact to soft touch on all 4 extremities. No evidence of extinction is noted.  Coordination: Cerebellar testing reveals good finger-nose-finger and heel-to-shin bilaterally.    DIAGNOSTIC DATA (LABS, IMAGING, TESTING) - I reviewed patient records, labs, notes, testing and imaging myself where available.  Lab Results  Component Value Date   WBC 14.1 (H) 04/03/2022   HGB 12.4 04/03/2022   HCT 39.9 04/03/2022   MCV 86.7 04/03/2022   PLT 314 04/03/2022      Component Value Date/Time   NA 139 04/03/2022 1824   NA 140 09/07/2021 1011   K 4.5 04/03/2022 1824   CL 103 04/03/2022 1824   CO2 25 04/03/2022 1824   GLUCOSE 127 (H) 04/03/2022 1824   BUN 17 04/03/2022 1824   BUN 11 09/07/2021 1011   CREATININE 0.81 04/03/2022 1824   CALCIUM 9.7 04/03/2022 1824   PROT 7.1 04/03/2022 1824   PROT 7.1 09/07/2021 1011   ALBUMIN 4.0 04/03/2022 1824   ALBUMIN 4.5 09/07/2021 1011   AST 23 04/03/2022 1824   ALT 12 04/03/2022 1824   ALKPHOS 66 04/03/2022 1824   BILITOT 1.4 (H) 04/03/2022 1824   BILITOT 1.3 (H) 09/07/2021 1011   GFRNONAA >60 04/03/2022 1824   GFRAA >60 01/26/2017 1640   Lab Results  Component Value Date   CHOL 202 (H) 04/15/2016   HDL 75.50 04/15/2016   LDLCALC 116 (H) 04/15/2016   LDLDIRECT 129.3 12/07/2010   TRIG 58 11/26/2020   CHOLHDL 3 04/15/2016   Lab Results  Component Value Date   HGBA1C 5.5 11/25/2020   Lab Results  Component Value Date   VITAMINB12 347 02/01/2021   Lab Results  Component Value Date   TSH 2.600 02/01/2021      ASSESSMENT AND PLAN 81 y.o. year old female  has a past medical history of Allergy, Asthma, At high risk for falls, Ataxia, Cataract, Diverticulosis, Emphysema, Gait abnormality  (05/14/2013), GERD (gastroesophageal reflux disease), Hepatic steatosis, Hyperlipidemia, Hypertension, Iron deficiency anemia, unspecified, Kidney cysts (11/16/12), Nystagmus, end-position, Osteopenia, Renal cyst, and Stroke ([place]) (01/2017). here with :  Seizures  Continue Keppra 500 mg twice a day  2.  Memory disturbance  MMSE 24/30 previously 18/30 Continue Namenda 10 mg BID   FU In 1 year or sooner if needed      Ward Givens, MSN, NP-C 07/26/2022, 1:11 PM [facility] [address] [place], [place] [address] [phone]

## 2022-08-18 ENCOUNTER — Other Ambulatory Visit: Payer: Self-pay | Admitting: Family Medicine

## 2022-08-18 DIAGNOSIS — I10 Essential (primary) hypertension: Secondary | ICD-10-CM

## 2022-09-11 ENCOUNTER — Emergency Department (HOSPITAL_COMMUNITY): Payer: Medicare Other

## 2022-09-11 ENCOUNTER — Emergency Department (HOSPITAL_COMMUNITY)
Admission: EM | Admit: 2022-09-11 | Discharge: 2022-09-11 | Disposition: A | Discharge status: Home / Self Care | Payer: Medicare Other | Attending: Emergency Medicine | Admitting: Emergency Medicine

## 2022-09-11 ENCOUNTER — Encounter (HOSPITAL_COMMUNITY): Payer: Self-pay | Admitting: Emergency Medicine

## 2022-09-11 DIAGNOSIS — Z7901 Long term (current) use of anticoagulants: Secondary | ICD-10-CM | POA: Diagnosis not present

## 2022-09-11 DIAGNOSIS — I10 Essential (primary) hypertension: Secondary | ICD-10-CM | POA: Diagnosis not present

## 2022-09-11 DIAGNOSIS — Z79899 Other long term (current) drug therapy: Secondary | ICD-10-CM | POA: Insufficient documentation

## 2022-09-11 DIAGNOSIS — U071 COVID-19: Secondary | ICD-10-CM | POA: Diagnosis not present

## 2022-09-11 DIAGNOSIS — J441 Chronic obstructive pulmonary disease with (acute) exacerbation: Secondary | ICD-10-CM | POA: Diagnosis not present

## 2022-09-11 DIAGNOSIS — J449 Chronic obstructive pulmonary disease, unspecified: Secondary | ICD-10-CM | POA: Insufficient documentation

## 2022-09-11 DIAGNOSIS — J439 Emphysema, unspecified: Secondary | ICD-10-CM | POA: Diagnosis not present

## 2022-09-11 DIAGNOSIS — R0602 Shortness of breath: Secondary | ICD-10-CM

## 2022-09-11 DIAGNOSIS — R7989 Other specified abnormal findings of blood chemistry: Secondary | ICD-10-CM

## 2022-09-11 DIAGNOSIS — Z7951 Long term (current) use of inhaled steroids: Secondary | ICD-10-CM | POA: Insufficient documentation

## 2022-09-11 LAB — CBC WITH DIFFERENTIAL/PLATELET
Abs Immature Granulocytes: 0.05 10*3/uL (ref 0.00–0.07)
Basophils Absolute: 0 10*3/uL (ref 0.0–0.1)
Basophils Relative: 0 %
Eosinophils Absolute: 0 10*3/uL (ref 0.0–0.5)
Eosinophils Relative: 0 %
HCT: 37.6 % (ref 36.0–46.0)
Hemoglobin: 12.4 g/dL (ref 12.0–15.0)
Immature Granulocytes: 1 %
Lymphocytes Relative: 4 %
Lymphs Abs: 0.3 10*3/uL — ABNORMAL LOW (ref 0.7–4.0)
MCH: 29.6 pg (ref 26.0–34.0)
MCHC: 33 g/dL (ref 30.0–36.0)
MCV: 89.7 fL (ref 80.0–100.0)
Monocytes Absolute: 1 10*3/uL (ref 0.1–1.0)
Monocytes Relative: 13 %
Neutro Abs: 6.4 10*3/uL (ref 1.7–7.7)
Neutrophils Relative %: 82 %
Platelets: 260 10*3/uL (ref 150–400)
RBC: 4.19 MIL/uL (ref 3.87–5.11)
RDW: 14.1 % (ref 11.5–15.5)
WBC: 7.8 10*3/uL (ref 4.0–10.5)
nRBC: 0 % (ref 0.0–0.2)

## 2022-09-11 LAB — BRAIN NATRIURETIC PEPTIDE: B Natriuretic Peptide: 265 pg/mL — ABNORMAL HIGH (ref 0.0–100.0)

## 2022-09-11 LAB — BASIC METABOLIC PANEL
Anion gap: 9 (ref 5–15)
BUN: 17 mg/dL (ref 8–23)
CO2: 28 mmol/L (ref 22–32)
Calcium: 9.5 mg/dL (ref 8.9–10.3)
Chloride: 99 mmol/L (ref 98–111)
Creatinine, Ser: 0.56 mg/dL (ref 0.44–1.00)
GFR, Estimated: >60 mL/min (ref >60)
Glucose, Bld: 136 mg/dL — ABNORMAL HIGH (ref 70–99)
Potassium: 3.7 mmol/L (ref 3.5–5.1)
Sodium: 136 mmol/L (ref 135–145)

## 2022-09-11 LAB — TROPONIN I (HIGH SENSITIVITY): Troponin I (High Sensitivity): 10 ng/L (ref <18)

## 2022-09-11 LAB — D-DIMER, QUANTITATIVE: D-Dimer, Quant: 0.49 ug/mL-FEU (ref 0.00–0.50)

## 2022-09-11 LAB — SARS CORONAVIRUS 2 BY RT PCR: SARS Coronavirus 2 by RT PCR: POSITIVE — AB

## 2022-09-11 MED ORDER — NIRMATRELVIR/RITONAVIR (PAXLOVID)TABLET: 3.0000 | ORAL_TABLET | Freq: Two times a day (BID) | ORAL | 0 refills | Status: AC

## 2022-09-11 MED ORDER — IPRATROPIUM BROMIDE 0.02 % IN SOLN
0.5000 mg | Freq: Once | RESPIRATORY_TRACT | Status: AC
  Administered 2022-09-11: 0.5 mg via RESPIRATORY_TRACT
  Filled 2022-09-11: qty 2.5

## 2022-09-11 MED ORDER — ALBUTEROL SULFATE HFA 108 (90 BASE) MCG/ACT IN AERS: 2.0000 | INHALATION_SPRAY | RESPIRATORY_TRACT | 0 refills | Status: AC | PRN

## 2022-09-11 MED ORDER — APIXABAN 2.5 MG PO TABS: 2.5000 mg | ORAL_TABLET | Freq: Two times a day (BID) | ORAL | 0 refills | Status: DC

## 2022-09-11 MED ORDER — METHYLPREDNISOLONE SODIUM SUCC 125 MG IJ SOLR
125.0000 mg | Freq: Once | INTRAMUSCULAR | Status: AC
  Administered 2022-09-11: 125 mg via INTRAVENOUS
  Filled 2022-09-11: qty 2

## 2022-09-11 MED ORDER — ALBUTEROL SULFATE (2.5 MG/3ML) 0.083% IN NEBU
20.0000 mg/h | INHALATION_SOLUTION | Freq: Once | RESPIRATORY_TRACT | Status: AC
  Administered 2022-09-11: 20 mg/h via RESPIRATORY_TRACT
  Filled 2022-09-11: qty 3

## 2022-09-11 MED ORDER — PREDNISONE 50 MG PO TABS: 50.0000 mg | ORAL_TABLET | Freq: Every day | ORAL | 0 refills | Status: AC

## 2022-09-11 MED ORDER — ALBUTEROL SULFATE (2.5 MG/3ML) 0.083% IN NEBU
INHALATION_SOLUTION | RESPIRATORY_TRACT | Status: AC
  Filled 2022-09-11: qty 24

## 2022-09-11 NOTE — ED Triage Notes (Signed)
Pt reports shortness of breath that started earlier tonight. She denies chest pain. Reports that she had a grand mal seizure last year during an ER visit for shortness of breath. Denies sick contacts, fever, or exposure to [place].

## 2022-09-11 NOTE — Discharge Instructions (Addendum)
You have an infection with COVID-19.  That is what is making you short of breath.  I have prescribed Paxlovid for you to try to reduce your risk of getting sick enough to need to come in the hospital.  However, there is an interaction with apixaban (Eliquis).  While you are taking Paxlovid, you need to be on a lower dose of apixaban (Eliquis).  I have ordered the lower dose for you.  For the 5 days that you are taking Paxlovid, take the lower dose of apixaban that I prescribed today.  After that, resume taking your usual dose.  Return if your shortness of breath is getting worse.  Also, consider getting a pulse oximeter to use at home.  If you get 1, return to the emergency department if your oxygen saturation drops below 92%.

## 2022-09-11 NOTE — ED Provider Notes (Signed)
[facility] DEPT Provider Note   CSN: [id number] Arrival date & time: 09/11/22  0244     History  Chief Complaint  Patient presents with   Shortness of Breath    Kristine Francis is a 81 y.o. female.  The history is provided by the patient.  Shortness of Breath She has history of hypertension, hyperlipidemia, COPD and comes in because of difficulty breathing tonight.  She denies chest pain, heaviness, tightness, pressure.  She denies cough.  She denies fever, chills, sweats.  She has not done anything to treat her symptoms.  Of note, she does state that she does not wish to be intubated and placed on a ventilator.  She states that she has never smoked.  She denies any sick contacts.   Home Medications Prior to Admission medications   Medication Sig Start Date End Date Taking? Authorizing Provider  acetaminophen (TYLENOL) 325 MG tablet Take 1-2 tablets (325-650 mg total) by mouth every 4 (four) hours as needed for mild pain. Patient taking differently: Take 325-650 mg by mouth as needed for mild pain. 12/11/20   Love, Ivan Anchors, PA-C  amLODipine (NORVASC) 2.5 MG tablet TAKE 1 TABLET BY MOUTH EVERY DAY 08/18/22   [name], MD  CVS MELATONIN 3 MG TABS tablet TAKE 1 TABLET BY MOUTH EVERYDAY AT BEDTIME 06/13/22   [name], MD  ELIQUIS 5 MG TABS tablet TAKE 1 TABLET TWICE A DAY 06/04/21   [name], MD  EPINEPHrine 0.3 mg/0.3 mL IJ SOAJ injection Inject 0.3 mg into the muscle as needed for anaphylaxis. 08/21/20   [provider]  furosemide (LASIX) 20 MG tablet (Prior Auth: Rx [id number])    [provider]  ibuprofen (ADVIL) 800 MG tablet  05/19/20   [provider]  levETIRAcetam (KEPPRA) 500 MG tablet TAKE 1 TABLET BY MOUTH TWICE A DAY 03/21/22   Dohmeier, Asencion Partridge, MD  losartan (COZAAR) 50 MG tablet (Prior Auth: Rx [id number])    [provider]  memantine (NAMENDA) 10 MG tablet Take 1 tablet (10 mg  total) by mouth 2 (two) times daily. 03/16/22   Ward Givens, NP  metoprolol tartrate (LOPRESSOR) 25 MG tablet TAKE 1 TABLET BY MOUTH TWICE A DAY 05/02/22   [name], MD  Multiple Vitamin (MULTIVITAMIN WITH MINERALS) TABS tablet Take 1 tablet by mouth daily. 09/07/21   Dohmeier, Asencion Partridge, MD  pantoprazole (PROTONIX) 20 MG tablet TAKE 1 TABLET BY MOUTH EVERY DAY 03/21/22   [name], MD  PANTOPRAZOLE SODIUM PO  07/19/21   [provider]  STARCH-MALTO DEXTRIN (THICK-IT) PACK Take 1 each by mouth 4 (four) times daily as needed. Thicken liquids to honey consistency 12/24/20   Love, Ivan Anchors, PA-C      Allergies    Contrast media [iodinated contrast media], Iohexol, and Lisinopril    Review of Systems   Review of Systems  Respiratory:  Positive for shortness of breath.   All other systems reviewed and are negative.   Physical Exam Updated Vital Signs BP (!) 183/108   Pulse 81   Temp 99.8 F (37.7 C)   Resp 16   SpO2 94%  Physical Exam Vitals and nursing note reviewed.   81 year old female, resting comfortably and in no acute distress. Vital signs are significant for elevated blood pressure. Oxygen saturation is 94%, which is normal. Head is normocephalic and atraumatic. PERRLA, EOMI. Oropharynx is clear. Neck is nontender and supple without adenopathy or  JVD. Back is nontender and there is no CVA tenderness. Lungs have diminished breath sounds diffusely with prolonged exhalation phase but no overt rales, wheezes, rhonchi. Chest is nontender. Heart has regular rate and rhythm without murmur. Abdomen is soft, flat, nontender. Extremities have no cyanosis or edema, full range of motion is present. Skin is warm and dry without rash. Neurologic: Mental status is normal, cranial nerves are intact, moves all extremities equally.  ED Results / Procedures / Treatments   Labs (all labs ordered are listed, but only abnormal results are displayed) Labs Reviewed  SARS  CORONAVIRUS 2 BY RT PCR  BRAIN NATRIURETIC PEPTIDE  BASIC METABOLIC PANEL  CBC WITH DIFFERENTIAL/PLATELET  D-DIMER, QUANTITATIVE  TROPONIN I (HIGH SENSITIVITY)    EKG EKG Interpretation  Date/Time:  Sunday September 11 2022 03:36:06 EDT Ventricular Rate:  79 PR Interval:  134 QRS Duration: 108 QT Interval:  428 QTC Calculation: 491 R Axis:   75 Text Interpretation: Sinus rhythm Nonspecific T abnrm, anterolateral leads Borderline prolonged QT interval When compared with ECG of 11/30/2020, No significant change was found Confirmed by Delora Fuel ([id number]) on 09/11/2022 3:49:07 AM  Radiology DG Chest 2 View  Result Date: 09/11/2022 CLINICAL DATA:  Shortness of breath. EXAM: CHEST - 2 VIEW COMPARISON:  Portable 12/02/2020 FINDINGS: There are symmetric biapical pleural-parenchymal scarring changes. The lungs are emphysematous and otherwise clear. The hila are retracted upward toward the scarring changes. The hilar and mediastinal configurations are unremarkable. There is a normal cardiac size. Calcification of the transverse aorta. Mild thoracic dextroscoliosis and osteopenia. IMPRESSION: No active cardiopulmonary disease.  COPD. Compare: Interval resolution of the prior CHF findings and moderate pulmonary edema. Electronically Signed   By: Telford Nab M.D.   On: 09/11/2022 04:11    Procedures Procedures  Cardiac monitor shows normal sinus rhythm, per my interpretation.  Medications Ordered in ED Medications  methylPREDNISolone sodium succinate (SOLU-MEDROL) 125 mg/2 mL injection 125 mg (has no administration in time range)  albuterol (PROVENTIL,VENTOLIN) solution continuous neb (has no administration in time range)  ipratropium (ATROVENT) nebulizer solution 0.5 mg (has no administration in time range)    ED Course/ Medical Decision Making/ A&P                           Medical Decision Making Amount and/or Complexity of Data Reviewed Labs: ordered. Radiology:  ordered.  Risk Prescription drug management.   Shortness of breath with hypoxia.  Differential diagnosis includes, but is not limited to, pneumonia, pulmonary embolism, COPD exacerbation.  I have reviewed and interpreted her electrocardiogram, my interpretation is nonspecific T wave abnormality unchanged from prior.  Chest x-ray shows changes of COPD with resolution of prior changes of heart failure.  I have independently viewed the images, and agree with radiologist's interpretation.  She does have findings suggestive of bronchospasm on exam, I have ordered a dose of methylprednisolone, continuous nebulizer treatment with albuterol and ipratropium.  I have ordered laboratory work-up including CBC, basic metabolic panel, BNP to look for evidence of heart failure, D-dimer to look for evidence of possible pulmonary embolism.  In the meantime, she is placed on supplemental oxygen.  I have reviewed and interpreted her laboratory tests, and my interpretation is mild elevation of random glucose, mild elevation of BNP, normal troponin, normal CBC, normal D-dimer.  Test for COVID-19 is positive, that is likely what is causing her dyspnea.  She did not have any subjective improvement with continuous nebulizer treatment,  but oxygen saturation was noted to be 100%.  I reduced the oxygen to 4 L/min and oxygen saturation remained at 100%.  I reduce the oxygen to 2 L/min, and oxygen saturation remained at 100%.  I reduced it to room air, and oxygen saturation remained at 96%.  Patient states that she was no longer feeling quite so dyspneic, so I feel that she is appropriate for discharge.  Because of her underlying COPD and age, she is definitely a candidate for antiviral therapy.  I have ordered prescriptions for prednisone, albuterol inhaler, and Paxlovid.  Because of interaction of Paxlovid and apixaban, I am also prescribing a reduced dose of apixaban for the time that she is on antiviral treatment.  I have encouraged  her to get a home pulse oximeter and to return to the emergency department if she develops hypoxia at home.  CRITICAL CARE Performed by: Delora Fuel Total critical care time: 70 minutes Critical care time was exclusive of separately billable procedures and treating other patients. Critical care was necessary to treat or prevent imminent or life-threatening deterioration. Critical care was time spent personally by me on the following activities: development of treatment plan with patient and/or surrogate as well as nursing, discussions with consultants, evaluation of patient's response to treatment, examination of patient, obtaining history from patient or surrogate, ordering and performing treatments and interventions, ordering and review of laboratory studies, ordering and review of radiographic studies, pulse oximetry and re-evaluation of patient's condition.  Final Clinical Impression(s) / ED Diagnoses Final diagnoses:  Shortness of breath  COVID-19 virus infection  Chronic obstructive pulmonary disease, unspecified COPD type (HCC)  Elevated brain natriuretic peptide (BNP) level  Chronic anticoagulation    Rx / DC Orders ED Discharge Orders          Ordered    predniSONE (DELTASONE) 50 MG tablet  Daily        09/11/22 0754    albuterol (VENTOLIN HFA) 108 (90 Base) MCG/ACT inhaler  Every 4 hours PRN        09/11/22 0754    nirmatrelvir/ritonavir EUA (PAXLOVID) 20 x 150 MG & 10 x '100MG'$  TABS  2 times daily        09/11/22 0754    apixaban (ELIQUIS) 2.5 MG TABS tablet  2 times daily        09/11/22 2707              Delora Fuel, MD 86/75/44 [phone]

## 2022-09-12 ENCOUNTER — Other Ambulatory Visit: Payer: Self-pay | Admitting: Family Medicine

## 2022-09-12 ENCOUNTER — Other Ambulatory Visit: Payer: Self-pay | Admitting: Adult Health

## 2022-09-12 DIAGNOSIS — I10 Essential (primary) hypertension: Secondary | ICD-10-CM

## 2022-09-13 ENCOUNTER — Other Ambulatory Visit: Payer: Self-pay | Admitting: *Deleted

## 2022-09-13 MED ORDER — MEMANTINE HCL 10 MG PO TABS: 10.0000 mg | ORAL_TABLET | Freq: Two times a day (BID) | ORAL | 3 refills | Status: DC

## 2022-09-16 ENCOUNTER — Telehealth: Payer: Self-pay | Admitting: *Deleted

## 2022-09-16 NOTE — Telephone Encounter (Signed)
     Patient  visit on 09/11/2022  at [facility] long ed  was for SOB  Have you been able to follow up with your primary care physician? No   The patient was able to obtain any needed medicine or equipment.  Are there diet recommendations that you are having difficulty following?  Patient expresses understanding of discharge instructions and education provided has no other needs at this time.   [place] [phone] [address] , [place] [phone] Email : [email or web address]

## 2022-10-10 ENCOUNTER — Encounter: Payer: Self-pay | Admitting: Podiatry

## 2022-10-10 ENCOUNTER — Ambulatory Visit (INDEPENDENT_AMBULATORY_CARE_PROVIDER_SITE_OTHER): Payer: Medicare Other | Admitting: Podiatry

## 2022-10-10 DIAGNOSIS — M79675 Pain in left toe(s): Secondary | ICD-10-CM

## 2022-10-10 DIAGNOSIS — B351 Tinea unguium: Secondary | ICD-10-CM

## 2022-10-10 DIAGNOSIS — M79674 Pain in right toe(s): Secondary | ICD-10-CM | POA: Diagnosis not present

## 2022-10-10 NOTE — Progress Notes (Signed)
  Subjective:  Patient ID: Kristine Francis, female    DOB: [birth year],  MRN: [id number]  Kristine Francis presents to clinic today for painful thick toenails that are difficult to trim. Pain interferes with ambulation. Aggravating factors include wearing enclosed shoe gear. Pain is relieved with periodic professional debridement.   Her husband is present during today's visit. Chief Complaint  Patient presents with   Nail Problem    Routine foot care PCP-Stephen Fry PCP VST-Early 2023   New problem(s): None.   PCP is [name], MD.  Allergies  Allergen Reactions   Contrast Media [Iodinated Contrast Media] Other (See Comments)    Congestion .Marland KitchenMarland Kitchenpatient stated it irritated her eyes and voice   Iohexol Other (See Comments)    Gi upset   Iodine Other (See Comments)   Lisinopril Cough    Review of Systems: Negative except as noted in the HPI.  Objective: No changes noted in today's physical examination.  Kristine Francis is a pleasant 81 y.o. female thin build in NAD. AAO x 3. Vascular Capillary fill time to digits immediate b/l.  DP/PT pulse(s) are palpable b/l lower extremities. Pedal hair absent. Lower extremity skin temperature gradient within normal limits. No pain with calf compression b/l. No edema noted b/l lower extremities. No cyanosis or clubbing noted.   Neurologic Protective sensation decreased with 10 gram monofilament b/l. Vibratory sensation decreased b/l.  Dermatologic Pedal skin is warm and supple b/l.  No open wounds b/l lower extremities. No interdigital macerations b/l lower extremities. Toenails 1-5 b/l elongated, discolored, dystrophic, thickened, crumbly with subungual debris and tenderness to dorsal palpation.   Orthopedic: Muscle strength 5/5 to all lower extremity muscle groups bilaterally. Hammertoe deformity noted 1-5 b/l. Utilizes wheelchair for mobility assistance.   Assessment/Plan: 1. Pain due to onychomycosis of toenails of both feet     No  orders of the defined types were placed in this encounter.   -Patient was evaluated and treated. All patient's and/or POA's questions/concerns answered on today's visit. -No new findings. No new orders. -Toenails 1-5 b/l were debrided in length and girth with sterile nail nippers and dremel without iatrogenic bleeding.  -Patient/POA to call should there be question/concern in the interim.   Return in about 3 months (around 01/10/2023).  Marzetta Board, DPM

## 2022-10-19 ENCOUNTER — Other Ambulatory Visit: Payer: Self-pay | Admitting: Family Medicine

## 2022-10-19 DIAGNOSIS — I10 Essential (primary) hypertension: Secondary | ICD-10-CM

## 2022-10-22 IMAGING — MR MR HEAD WO/W CM
11 of 15 series · 35 of 48 positions shown · IV contrast (Yes   MULTIHANCE)
Comparison: Head CT November 25, 2020.

CLINICAL DATA: Seizure, nontraumatic.

EXAM:
MRI HEAD WITHOUT AND WITH CONTRAST
TECHNIQUE: Multiplanar, multiecho pulse sequences of the brain and surrounding
structures were obtained without and with intravenous contrast.
CONTRAST:  4mL GADAVIST GADOBUTROL 1 MMOL/ML IV SOLN

[Series 4: DWI · coronal · 5.0mm · 1.09mm/px · 4 of 64 slices shown (1 of 6)]
[im 1/64]
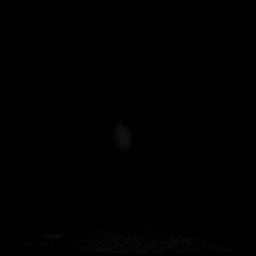
[im 22/64]
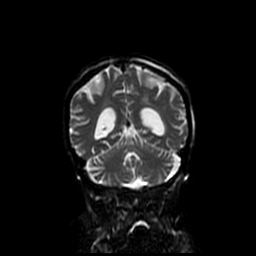
[im 43/64]
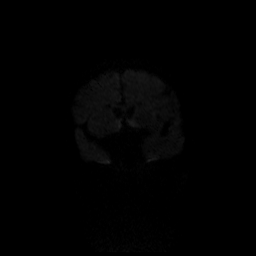
[im 64/64]
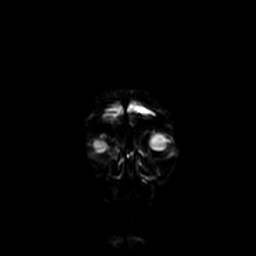

[Series 6: T2 · axial · 5.0mm · 0.43mm/px · z∈[-49,+86]mm · 2 of 24 slices shown (1 of 2)]
[im 1/24]
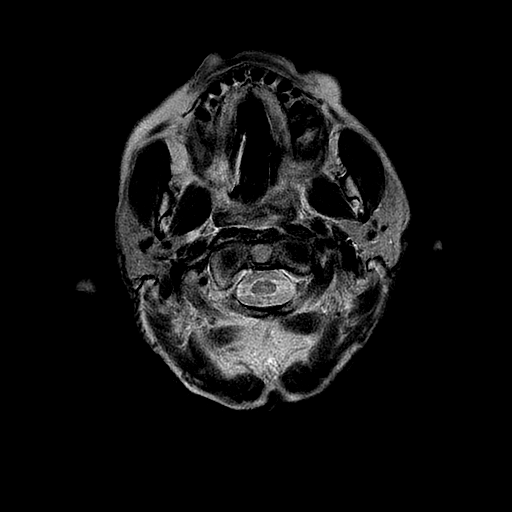
[im 24/24]
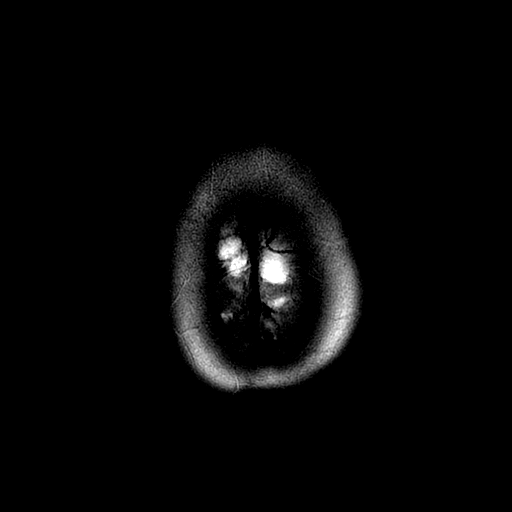

[Series 7: FLAIR · axial · 5.0mm · 0.43mm/px · z∈[-49,+86]mm · 2 of 24 slices shown]
[im 1/24]
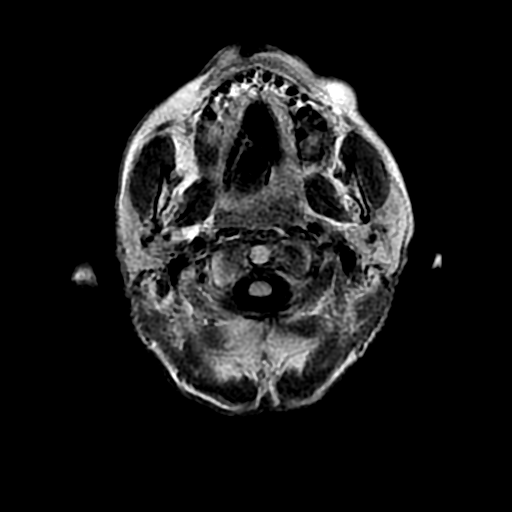
[im 24/24]
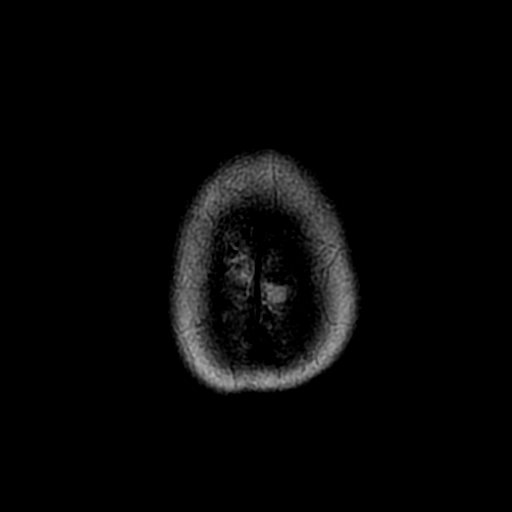

[Series 10: T2 · coronal · 3.0mm · 0.35mm/px · 2 of 21 slices shown (2 of 2)]
[im 1/21]
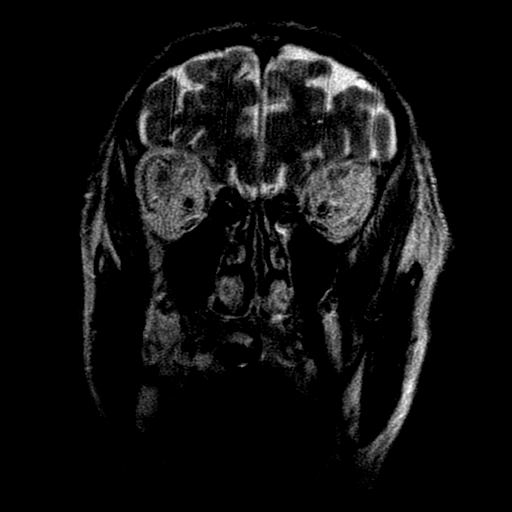
[im 21/21]
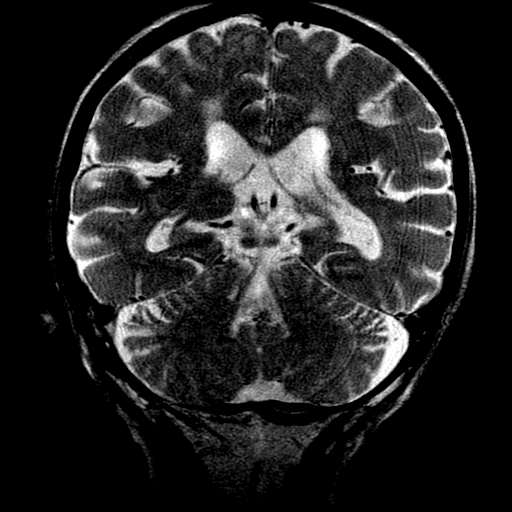

[Series 13: T1 post-contrast · axial · 3.0mm · 0.47mm/px · z∈[-52,+92]mm · 4 of 50 slices shown (1 of 2)]
[im 1/50]
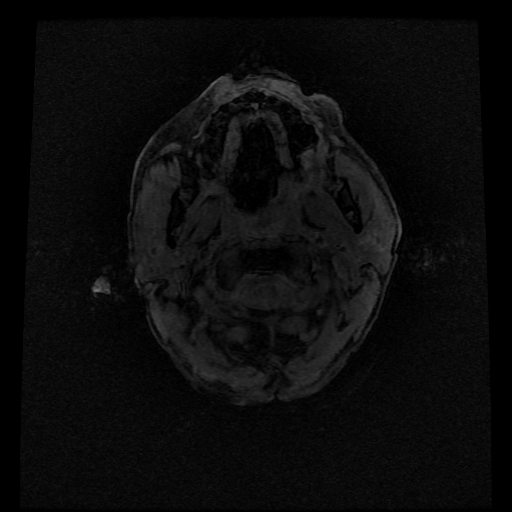
[im 17/50]
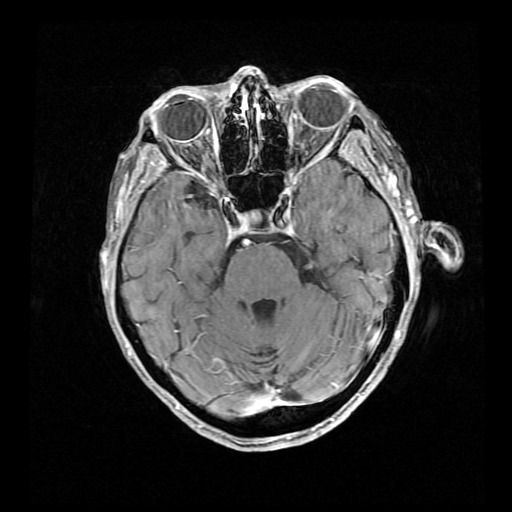
[im 33/50]
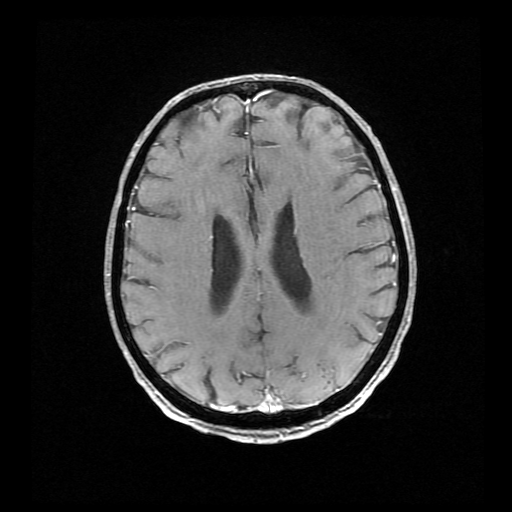
[im 50/50]
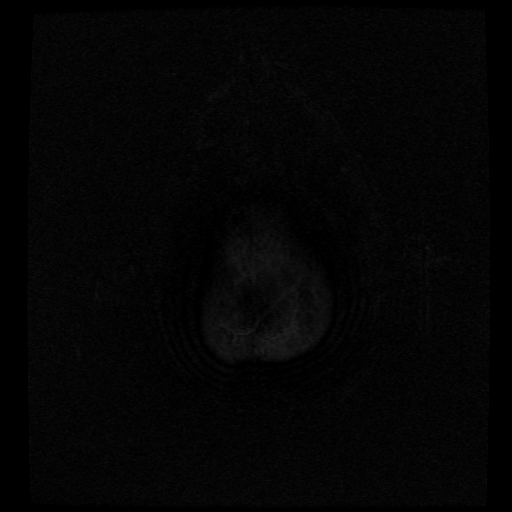

[Series 14: T1 post-contrast · coronal · 5.0mm · 0.39mm/px · 2 of 25 slices shown (2 of 2)]
[im 1/25]
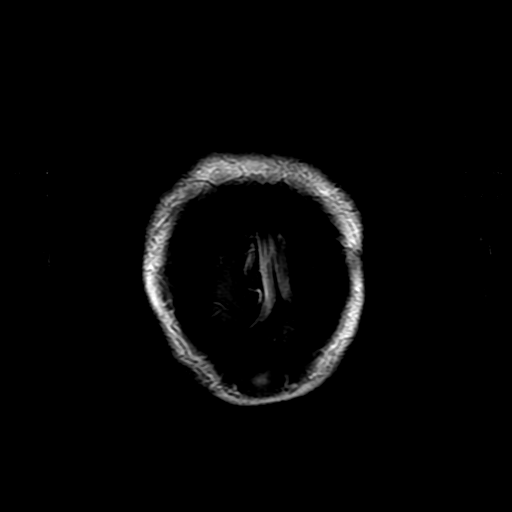
[im 25/25]
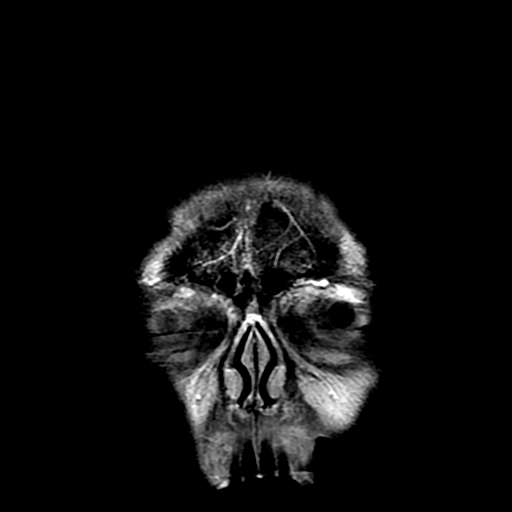

[Series 15: DWI · axial · 3.0mm · 1.09mm/px · z∈[-38,+95]mm · 7 of 94 slices shown (2 of 6)]
[im 1/94]
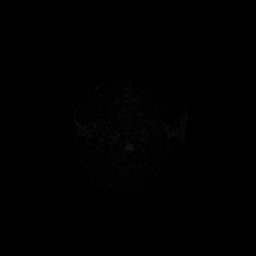
[im 16/94]
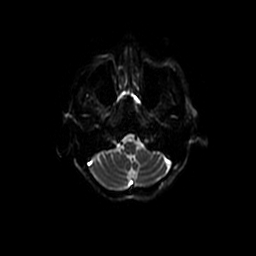
[im 32/94]
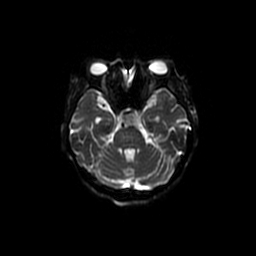
[im 47/94]
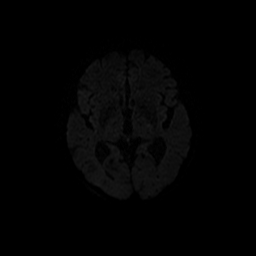
[im 63/94]
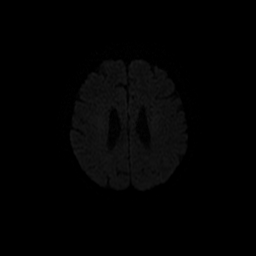
[im 78/94]
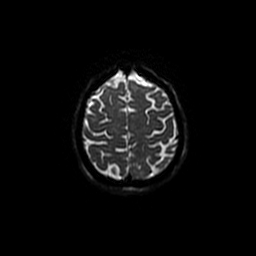
[im 94/94]
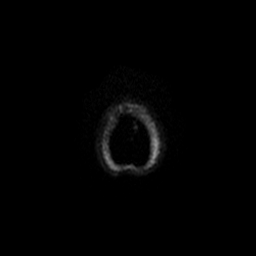

[Series 16: DWI · coronal · 5.0mm · 1.09mm/px · 5 of 64 slices shown (3 of 6)]
[im 1/64]
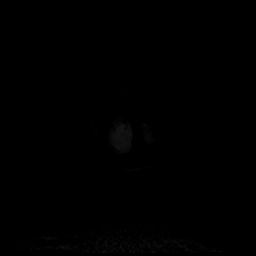
[im 16/64]
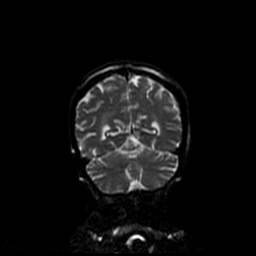
[im 32/64]
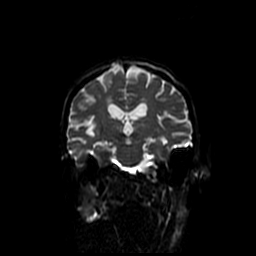
[im 48/64]
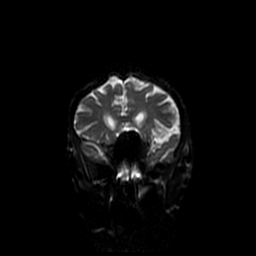
[im 64/64]
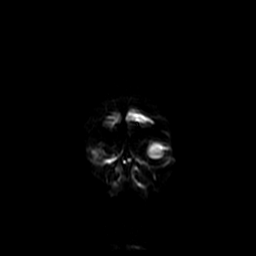

[Series 400: DWI · coronal · 5.0mm · 1.09mm/px · 2 of 32 slices shown (4 of 6)]
[im 1/32]
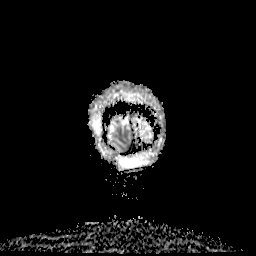
[im 32/32]
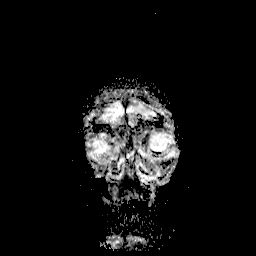

[Series 1500: DWI · axial · 3.0mm · 1.09mm/px · z∈[-38,+95]mm · 3 of 47 slices shown (5 of 6)]
[im 1/47]
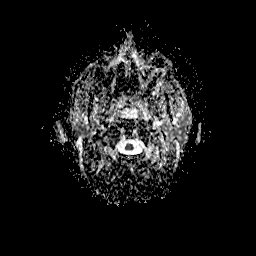
[im 24/47]
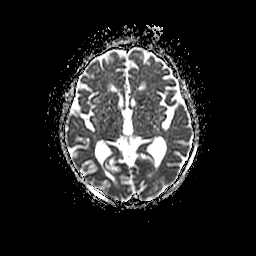
[im 47/47]
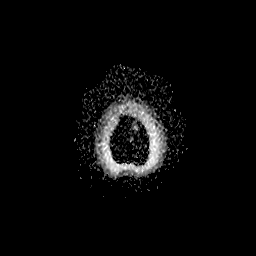

[Series 1600: DWI · coronal · 5.0mm · 1.09mm/px · 2 of 32 slices shown (6 of 6)]
[im 1/32]
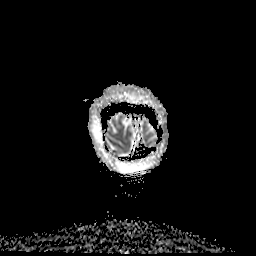
[im 32/32]
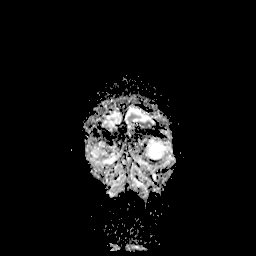

[35 of 48 positions shown; findings below may reference images not displayed]

FINDINGS: Brain: No acute infarction, hemorrhage, hydrocephalus, extra-axial
collection or mass lesion.

Remote lacunar infarct in the right basal ganglia region. Scattered
foci of T2 hyperintensity are seen within the white matter of the
cerebral hemispheres and within the pons, nonspecific, most likely
related to chronic small vessel ischemia.

Multiple foci of susceptibility artifact are noted in the bilateral
cerebral hemispheres, more pronounced in the left occipital region
where there is associated subtle T2 hyperintensity of the
subcortical white matter and some of the foci appear to be cortical.
No focus of abnormal contrast enhancement.

Vascular: Normal flow voids.

Skull and upper cervical spine: Normal marrow signal.

Sinuses/Orbits: Bilateral lens surgery. Paranasal sinuses are
essentially clear.
IMPRESSION: 1. Multiple foci of susceptibility artifact in the bilateral
cerebral hemispheres, more pronounced in the left occipital region
where there is associated subtle T2 hyperintensity of the
subcortical white matter and some of the foci appear to be cortical.
Findings may represent chronic microhemorrhages related to
hypertension or amyloid angiopathy.
2. Remote lacunar infarct in the right basal ganglia region.
3. Scattered foci of T2 hyperintensity within the white matter of
the cerebral hemispheres and pons, nonspecific, most likely related
to chronic small vessel ischemia.

## 2023-01-31 ENCOUNTER — Ambulatory Visit (INDEPENDENT_AMBULATORY_CARE_PROVIDER_SITE_OTHER): Payer: Medicare Other | Admitting: Podiatry

## 2023-01-31 ENCOUNTER — Encounter: Payer: Self-pay | Admitting: Podiatry

## 2023-01-31 VITALS — BP 153/86

## 2023-01-31 DIAGNOSIS — B351 Tinea unguium: Secondary | ICD-10-CM | POA: Diagnosis not present

## 2023-01-31 DIAGNOSIS — M79674 Pain in right toe(s): Secondary | ICD-10-CM

## 2023-01-31 DIAGNOSIS — M79675 Pain in left toe(s): Secondary | ICD-10-CM

## 2023-01-31 NOTE — Progress Notes (Signed)
  Subjective:  Patient ID: Kristine Francis, female    DOB: 1941/11/06,  MRN: [id number] Kristine Francis presents to clinic today for:  Chief Complaint  Patient presents with   Nail Problem    [facility] PCP-Fry, Annie Main PCP [phone]  .  PCP is [name], MD.  Allergies  Allergen Reactions   Contrast Media [Iodinated Contrast Media] Other (See Comments)    Congestion .Marland KitchenMarland Kitchenpatient stated it irritated her eyes and voice   Iohexol Other (See Comments)    Gi upset   Iodine Other (See Comments)   Lisinopril Cough    Review of Systems: Negative except as noted in the HPI.  Objective: No changes noted in today's physical examination. There were no vitals filed for this visit.  Kristine Francis is a pleasant 82 y.o. female in NAD. AAO x 3.  Vascular Examination: Capillary refill time <3 seconds b/l LE. Palpable pedal pulses b/l LE. Digital hair diminished b/l.  No pedal edema b/l. Skin temperature gradient WNL b/l. No cyanosis or clubbing noted b/l LE.Marland Kitchen  Dermatological Examination: Pedal skin with normal turgor, texture and tone b/l. No open wounds. No interdigital macerations b/l. Toenails 1-5 b/l thickened, discolored, dystrophic with subungual debris. There is pain on palpation to dorsal aspect of nailplates. No hyperkeratotic nor porokeratotic lesions present on today's visit.Marland Kitchen  Neurological Examination: Protective sensation intact with 10 gram monofilament b/l LE. Vibratory sensation intact b/l LE.   Musculoskeletal Examination: Muscle strength 5/5 to all LE muscle groups b/l. Hammertoe deformity noted 1-5 b/l.  Assessment/Plan: 1. Pain due to onychomycosis of toenails of both feet     -Consent given for treatment as described below: -Examined patient. -Patient to continue soft, supportive shoe gear daily. -Mycotic toenails 1-5 bilaterally were debrided in length and girth with sterile nail nippers and dremel without incident. -Patient/POA to call should there be  question/concern in the interim.   Return in about 3 months (around 05/03/2023).  Marzetta Board, DPM

## 2023-03-12 ENCOUNTER — Other Ambulatory Visit: Payer: Self-pay | Admitting: Neurology

## 2023-03-13 ENCOUNTER — Telehealth: Payer: Self-pay | Admitting: Family Medicine

## 2023-03-13 NOTE — Telephone Encounter (Signed)
Contacted Kristine Francis to schedule their annual wellness visit. Appointment made for 03/23/23.  Kristine Francis AW[facility] direct phone # [phone]  Due to schedule change awv appt 03/23/23 change from 845 to 1120  patient aware of time chagne

## 2023-03-15 ENCOUNTER — Other Ambulatory Visit: Payer: Self-pay | Admitting: Family Medicine

## 2023-03-23 ENCOUNTER — Ambulatory Visit (INDEPENDENT_AMBULATORY_CARE_PROVIDER_SITE_OTHER): Payer: Medicare Other | Admitting: Family Medicine

## 2023-03-23 ENCOUNTER — Encounter: Payer: Self-pay | Admitting: Family Medicine

## 2023-03-23 VITALS — Ht 60.0 in | Wt 89.6 lb

## 2023-03-23 DIAGNOSIS — Z0000 Encounter for general adult medical examination without abnormal findings: Secondary | ICD-10-CM | POA: Diagnosis not present

## 2023-03-23 NOTE — Patient Instructions (Addendum)
I really enjoyed getting to talk with you today! I am available on Tuesdays and Thursdays for virtual visits if you have any questions or concerns, or if I can be of any further assistance.   CHECKLIST FROM ANNUAL WELLNESS VISIT:  -Follow up (please call to schedule if not scheduled after visit):   -yearly for annual wellness visit with primary care office  Here is a list of your preventive care/health maintenance measures and the plan for each if any are due:  PLAN For any measures below that may be due:  -get the vaccines at the pharmacy, please let us know when you do so that we can update  Health Maintenance  Topic Date Due   Zoster Vaccines- Shingrix (1 of 2) Never done   DTaP/Tdap/Td (1 - Tdap) 12/25/2012   COVID-19 Vaccine (3 - 2023-24 season) 07/22/2022   INFLUENZA VACCINE  06/22/2023   Medicare Annual Wellness (AWV)  03/22/2024   COLONOSCOPY (Pts 45-26yrs Insurance coverage will need to be confirmed)  04/25/2024   Pneumonia Vaccine 5+ Years old  Completed   DEXA SCAN  Completed   HPV VACCINES  Aged Out    -See a dentist at least yearly  -Get your eyes checked and then per your eye specialist's recommendations  -Other issues addressed today:   -I have included below further information regarding a healthy whole foods based diet, physical activity guidelines for adults, stress management and opportunities for social connections. I hope you find this information useful.   -----------------------------------------------------------------------------------------------------------------------------------------------------------------------------------------------------------------------------------------------------------  NUTRITION: -eat real food: lots of colorful vegetables (half the plate) and fruits -5-7 servings of vegetables and fruits per day (fresh or steamed is best), exp. 2 servings of vegetables with lunch and dinner and 2 servings of fruit per day. Berries and  greens such as kale and collards are great choices.  -consume on a regular basis: whole grains (make sure first ingredient on label contains the word "whole"), fresh fruits, fish, nuts, seeds, healthy oils (such as olive oil, avocado oil, grape seed oil) -may eat small amounts of dairy and lean meat on occasion, but avoid processed meats such as ham, bacon, lunch meat, etc. -drink water -try to avoid fast food and pre-packaged foods, processed meat -most experts advise limiting sodium to < 2300mg  per day, should limit further is any chronic conditions such as high blood pressure, heart disease, diabetes, etc. The American Heart Association advised that < 1500mg  is is ideal -try to avoid foods that contain any ingredients with names you do not recognize  -try to avoid sugar/sweets (except for the natural sugar that occurs in fresh fruit) -try to avoid sweet drinks -try to avoid white rice, white bread, pasta (unless whole grain), white or yellow potatoes  EXERCISE GUIDELINES FOR ADULTS: -if you wish to increase your physical activity, do so gradually and with the approval of your doctor -STOP and seek medical care immediately if you have any chest pain, chest discomfort or trouble breathing when starting or increasing exercise  -move and stretch your body, legs, feet and arms when sitting for long periods -Physical activity guidelines for optimal health in adults: -least 150 minutes per week of aerobic exercise (can talk, but not sing) once approved by your doctor, 20-30 minutes of sustained activity or two 10 minute episodes of sustained activity every day.  -resistance training at least 2 days per week if approved by your doctor -balance exercises 3+ days per week:   Stand somewhere where you have something sturdy to  hold onto if you lose balance.    1) lift up on toes, start with 5x per day and work up to 20x   2) stand and lift on leg straight out to the side so that foot is a few inches of  the floor, start with 5x each side and work up to 20x each side   3) stand on one foot, start with 5 seconds each side and work up to 20 seconds on each side  If you need ideas or help with getting more active:  -Silver sneakers https://tools.silversneakers.com  -Walk with a Doc: http://www.duncan-williams.com/  -try to include resistance (weight lifting/strength building) and balance exercises twice per week: or the following link for ideas: http://castillo-powell.com/  BuyDucts.dk  STRESS MANAGEMENT: -can try meditating, or just sitting quietly with deep breathing while intentionally relaxing all parts of your body for 5 minutes daily -if you need further help with stress, anxiety or depression please follow up with your primary doctor or contact the wonderful folks at [facility]: [phone]  SOCIAL CONNECTIONS: -options in [place] if you wish to engage in more social and exercise related activities:  -Silver sneakers https://tools.silversneakers.com  -Walk with a Doc: http://www.duncan-williams.com/  -Check out the [facility] Active Adults 50+ section on the [place] [employer] (hiking clubs, book clubs, cards and games, chess, exercise classes, aquatic classes and much more) - see the website for details: https://www.[place].gov/departments/parks-recreation/active-adults50  -YouTube has lots of exercise videos for different ages and abilities as well  -[facility] (a variety of indoor and outdoor inperson activities for adults). [phone]. [address].  -Virtual Online Classes (a variety of topics): see seniorplanet.org or call [phone]  -consider volunteering at a school, hospice center, church, senior center or elsewhere

## 2023-03-23 NOTE — Progress Notes (Signed)
PATIENT CHECK-IN and HEALTH RISK ASSESSMENT QUESTIONNAIRE:  -completed by phone/video for upcoming Medicare Preventive Visit  Pre-Visit Check-in: 1)Vitals (height, wt, BP, etc) - record in vitals section for visit on day of visit 2)Review and Update Medications, Allergies PMH, Surgeries, Social history in Epic 3)Hospitalizations in the last year with date/reason? No  4)Review and Update Care Team (patient's specialists) in Epic 5) Complete PHQ9 in Epic  6) Complete Fall Screening in Epic 7)Review all Health Maintenance Due and order under PCP if not done.  8)Medicare Wellness Questionnaire: Answer theses question about your habits: Do you drink alcohol? No I Have you ever smoked?No  How many packs a day do/did you smoke?  Do you use smokeless tobacco?No Do you use an illicit drugs?No Do you exercises? No  Are you sexually active?  No Number of partners? Reports eats at home, eats lots fruits and veggies Typical breakfast: Oatmeal, Fruit, Eggs, Tomasa Blase, [address]. Toast Typical lunch: Varies Typical dinner: Varies Typical snacks: Trail mix, cheese  Beverages: Tea, Coffee  Answer theses question about you: Can you perform most household chores? yes Do you find it hard to follow a conversation in a noisy room? N/A Do you often ask people to speak up or repeat themselves? no Do you feel that you have a problem with memory? Yes - but feels like is just normal day to day things Do you balance your checkbook and or bank acounts? No Do you feel safe at home? Yes Last dentist visit? 1 year ago Do you need assistance with any of the following: Please note if so   Driving? N/A  Feeding yourself? No  Getting from bed to chair? Yes  Getting to the toilet? Yes  Bathing or showering? Yes  Dressing yourself? Yes  Managing money? Yes  Climbing a flight of stairs Yes  Preparing meals? Yes  Do you have Advanced Directives in place (Living Will, Healthcare Power or Attorney)?  Yes   Last  eye Exam and location? [facility], 2 years   Do you currently use prescribed or non-prescribed narcotic or opioid pain medications? No  Do you have a history or close family history of breast, ovarian, tubal or peritoneal cancer or a family member with BRCA (breast cancer susceptibility 1 and 2) gene mutations? No  Nurse/Assistant Credentials/time stamp: MG, 11:07 AM   ----------------------------------------------------------------------------------------------------------------------------------------------------------------------------------------------------------------------   MEDICARE ANNUAL PREVENTIVE VISIT WITH PROVIDER: (Welcome to [employer], initial annual wellness or annual wellness exam)  Virtual Visit via Phone Note  I connected with Kristine Francis on 03/23/23 by phone and verified that I am speaking with the correct person using two identifiers.  Location patient: home Location provider:work or home office Persons participating in the virtual visit: patient, provider  Concerns and/or follow up today: reports has been doing well.    See HM section in Epic for other details of completed HM.    ROS: negative for report of fevers, unintentional weight loss, vision changes, vision loss, hearing loss or change, chest pain, sob, hemoptysis, melena, hematochezia, hematuria, falls, bleeding or bruising, thoughts of suicide or self harm, memory loss  Patient-completed extensive health risk assessment - reviewed and discussed with the patient: See Health Risk Assessment completed with patient prior to the visit either above or in recent phone note. This was reviewed in detailed with the patient today and appropriate recommendations, orders and referrals were placed as needed per Summary below and patient instructions.   Review of Medical History: -PMH, PSH, Family History and current specialty  and care providers reviewed and updated and listed below   Patient  Care Team: Nelwyn Salisbury, MD as PCP - General (Family Medicine) Dohmeier, Porfirio Mylar, MD (Neurology) Osborn Coho, MD (Inactive) (Otolaryngology) Maris Berger, MD (Ophthalmology) Eileen Stanford, MD (Allergy and Immunology) Luvenia Redden, MD (Obstetrics and Gynecology) Hart Carwin, MD (Inactive) (Gastroenterology)   Past Medical History:  Diagnosis Date   Allergy    Asthma    sees Dr. Eileen Stanford - as child per pt   At high risk for falls    unstable gait    Ataxia    Cataract    removed bilaterally    Diverticulosis    Emphysema    Gait abnormality 05/14/2013   Ms.Fulop a patient of Dr. Debby Bud presented first interpreter thousand 13 with a gait dysfunction, she also had an episodic confusion and Loss device. Exam found a mild nystagmus and she was referred to ophthalmology on 03-2812, brain MRI was normal nystagmus was a secondary diagnosis to vestibulitis, ear nose and throat has followed and had seen the patient. At the time of the nystagmus the patie   GERD (gastroesophageal reflux disease)    Hepatic steatosis    Hyperlipidemia    on atorvastatin    Hypertension    on losartan    Iron deficiency anemia, unspecified    Kidney cysts 11/16/12   small cyst on left   Nystagmus, end-position    Osteopenia    Renal cyst    Stroke (HCC) 01/2017   mild     Past Surgical History:  Procedure Laterality Date   ANAL RECTAL MANOMETRY N/A 12/20/2017   Procedure: ANO RECTAL MANOMETRY;  Surgeon: Napoleon Form, MD;  Location: [facility];  Service: Endoscopy;  Laterality: N/A;   BREAST BIOPSY Bilateral 1970's   CATARACT EXTRACTION Bilateral LT:02/14/13,RT:02/21/13   CERVICAL SPINE SURGERY  2010   COLONOSCOPY  03-12-14   per Dr. Juanda Chance, diverticulosis only, repeat 5 yrs    EYE SURGERY Bilateral 01/2013&02/2013   left then right   HERNIA REPAIR  Oct '14-left, remote-right   years ago right; left done '14   SKIN CANCER EXCISION  05/13/13   FACE, basal cell   TONSILLECTOMY       Social History   Socioeconomic History   Marital status: Married    Spouse name: Not on file   Number of children: 3   Years of education: 16   Highest education level: Not on file  Occupational History   Occupation: newspaper Programmer, multimedia    Comment: reitred    Employer: RETIRED  Tobacco Use   Smoking status: Never   Smokeless tobacco: Never  Vaping Use   Vaping Use: Never used  Substance and Sexual Activity   Alcohol use: No    Alcohol/week: 0.0 standard drinks of alcohol   Drug use: No   Sexual activity: Yes    Partners: Male  Other Topics Concern   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 married - 1965. 3 sons - '66, '69, '70. Sons in good health. Marriage in good health. Enjoys retirement - remains active.         Social Determinants of Health   Financial Resource Strain: Low Risk  (03/21/2022)   Overall Financial Resource Strain (CARDIA)    Difficulty of Paying Living Expenses: Not hard at all  Food Insecurity: No Food Insecurity (03/21/2022)   Hunger Vital Sign  Worried About Programme researcher, broadcasting/film/video in the Last Year: Never true    Ran Out of Food in the Last Year: Never true  Transportation Needs: No Transportation Needs (03/21/2022)   PRAPARE - Administrator, Civil Service (Medical): No    Lack of Transportation (Non-Medical): No  Physical Activity: Inactive (03/21/2022)   Exercise Vital Sign    Days of Exercise per Week: 0 days    Minutes of Exercise per Session: 0 min  Stress: No Stress Concern Present (03/21/2022)   [employer] of Occupational Health - Occupational Stress Questionnaire    Feeling of Stress : Not at all  Social Connections: Socially Integrated (03/21/2022)   Social Connection and Isolation Panel [NHANES]    Frequency of Communication with Friends and Family: More than three times a week    Frequency of Social Gatherings with Friends and Family: More than three times  a week    Attends Religious Services: More than 4 times per year    Active Member of [employer] or Organizations: Yes    Attends Engineer, structural: More than 4 times per year    Marital Status: Married  Catering manager Violence: Not At Risk (03/21/2022)   Humiliation, Afraid, Rape, and Kick questionnaire    Fear of Current or Ex-Partner: No    Emotionally Abused: No    Physically Abused: No    Sexually Abused: No    Family History  Problem Relation Age of Onset   Pancreatic cancer Mother    Throat cancer Mother    Colon cancer Father    Coronary artery disease Father    Heart block Father    Ovarian cancer Maternal Aunt    Colon cancer Paternal Aunt        x 2   Cancer - Lung Paternal Aunt    Breast cancer Paternal Aunt    Colon cancer Paternal Aunt    Colon cancer Paternal Grandmother    Heart attack Paternal Grandfather    Esophageal cancer Other    Colon polyps Neg Hx    Rectal cancer Neg Hx    Stomach cancer Neg Hx    Dementia Neg Hx     Current Outpatient Medications on File Prior to Visit  Medication Sig Dispense Refill   acetaminophen (TYLENOL) 325 MG tablet Take 1-2 tablets (325-650 mg total) by mouth every 4 (four) hours as needed for mild pain. (Patient taking differently: Take 325-650 mg by mouth as needed for mild pain.)     albuterol (VENTOLIN HFA) 108 (90 Base) MCG/ACT inhaler Inhale 2 puffs into the lungs every 4 (four) hours as needed for wheezing or shortness of breath. 1 each 0   EPINEPHrine 0.3 mg/0.3 mL IJ SOAJ injection Inject 0.3 mg into the muscle as needed for anaphylaxis.     furosemide (LASIX) 20 MG tablet (Prior Auth: Rx ZOX#:[id number])     ibuprofen (ADVIL) 800 MG tablet      levETIRAcetam (KEPPRA) 500 MG tablet TAKE 1 TABLET BY MOUTH TWICE A DAY 180 tablet 1   memantine (NAMENDA) 10 MG tablet Take 1 tablet (10 mg total) by mouth 2 (two) times daily. 180 tablet 3   pantoprazole (PROTONIX) 20 MG tablet TAKE 1 TABLET BY MOUTH EVERY DAY 90  tablet 0   amLODipine (NORVASC) 2.5 MG tablet TAKE 1 TABLET BY MOUTH EVERY DAY (Patient not taking: Reported on 03/23/2023) 90 tablet 0   apixaban (ELIQUIS) 2.5 MG TABS tablet Take 1 tablet (2.5  mg total) by mouth 2 (two) times daily. (Patient not taking: Reported on 03/23/2023) 10 tablet 0   ELIQUIS 5 MG TABS tablet TAKE 1 TABLET TWICE A DAY (Patient not taking: Reported on 03/23/2023) 180 tablet 3   losartan (COZAAR) 50 MG tablet (Prior Auth: Rx ZOX#:[id number]) (Patient not taking: Reported on 03/23/2023)     metoprolol tartrate (LOPRESSOR) 25 MG tablet TAKE 1 TABLET BY MOUTH TWICE A DAY (Patient not taking: Reported on 03/23/2023) 180 tablet 1   Multiple Vitamin (MULTIVITAMIN WITH MINERALS) TABS tablet Take 1 tablet by mouth daily. (Patient not taking: Reported on 03/23/2023)     predniSONE (DELTASONE) 50 MG tablet Take 1 tablet (50 mg total) by mouth daily. (Patient not taking: Reported on 03/23/2023) 5 tablet 0   STARCH-MALTO DEXTRIN (THICK-IT) PACK Take 1 each by mouth 4 (four) times daily as needed. Thicken liquids to honey consistency (Patient not taking: Reported on 03/23/2023) 200 each 0   No current facility-administered medications on file prior to visit.    Allergies  Allergen Reactions   Contrast Media [Iodinated Contrast Media] Other (See Comments)    Congestion .Marland KitchenMarland Kitchenpatient stated it irritated her eyes and voice   Iohexol Other (See Comments)    Gi upset   Iodine Other (See Comments)   Lisinopril Cough       Physical Exam There were no vitals filed for this visit. Estimated body mass index is 17.5 kg/m as calculated from the following:   Height as of this encounter: 5' (1.524 m).   Weight as of this encounter: 89 lb 9.6 oz (40.6 kg).  EKG (optional): deferred due to virtual visit  GENERAL: alert, oriented, no acute distress detected, full vision exam deferred due to pandemic and/or virtual encounter  PSYCH/NEURO: pleasant and cooperative, no obvious depression or anxiety, speech  and thought processing grossly intact, Cognitive function grossly intact  Flowsheet Row Office Visit from 03/23/2023 in [facility] [place]  PHQ-9 Total Score 2           03/23/2023   10:56 AM 03/21/2022    9:11 AM 02/05/2021    9:22 AM 01/05/2021    2:17 PM 09/18/2018    1:54 PM  Depression screen PHQ 2/9  Decreased Interest 1 0 0 0 0  Down, Depressed, Hopeless 0 0 0 0 0  PHQ - 2 Score 1 0 0 0 0  Altered sleeping 0   0   Tired, decreased energy 0   0   Change in appetite 0   0   Feeling bad or failure about yourself  0   0   Trouble concentrating 1   0   Moving slowly or fidgety/restless 0   0   Suicidal thoughts 0   0   PHQ-9 Score 2   0   Difficult doing work/chores Not difficult at all           03/18/2021   10:43 AM 03/21/2022    9:16 AM 04/03/2022    6:21 PM 09/11/2022    3:38 AM 03/23/2023   10:59 AM  Fall Risk  Falls in the past year? 0 1   0  Was there an injury with Fall? 0 0   0  Was there an injury with Fall? - Comments  No injury or medical attention needed     Fall Risk Category Calculator 0 1   0  Fall Risk Category (Retired) Low Low     (RETIRED) Patient Fall Risk  Level Low fall risk Low fall risk Moderate fall risk Low fall risk   Patient at Risk for Falls Due to  No Fall Risks   No Fall Risks  Fall risk Follow up Falls evaluation completed    Falls evaluation completed     SUMMARY AND PLAN:  Encounter for Medicare annual wellness exam    Discussed applicable health maintenance/preventive health measures and advised and referred or ordered per patient preferences: -she plans to get vaccine at the pharmacy, requested she let us know when she does  Health Maintenance  Topic Date Due   Zoster Vaccines- Shingrix (1 of 2) Never done   DTaP/Tdap/Td (1 - Tdap) 12/25/2012   COVID-19 Vaccine (3 - 2023-24 season) 07/22/2022   INFLUENZA VACCINE  06/22/2023   Medicare Annual Wellness (AWV)  03/22/2024   COLONOSCOPY (Pts 45-65yrs  Insurance coverage will need to be confirmed)  04/25/2024   Pneumonia Vaccine 34+ Years old  Completed   DEXA SCAN  Completed   HPV VACCINES  Aged Out    Education and counseling on the following was provided based on the above review of health and a plan/checklist for the patient, along with additional information discussed, was provided for the patient in the patient instructions :   -Provided counseling and plan for difficulty hearing  - Reviewed and discussed safe balance exercises that can be done at home to improve balance and discussed exercise guidelines for adults with include balance exercises at least 3 days per week.  -Advised and counseled on a healthy lifestyle - including the importance of a healthy diet, regular physical activity, social connections and stress management. -Reviewed patient's current diet. Advised and counseled on a whole foods based healthy diet. A summary of a healthy diet was provided in the Patient Instructions.  -reviewed patient's current physical activity level and discussed exercise guidelines for adults. Discussed community resources and ideas for safe exercise at home to assist in meeting exercise guideline recommendations in a safe and healthy way. Encouraged to add light 1-2 lb hand weights, chair exercises, and walking in place while holding onto sturdy counter or dresser - starting with 5 minutes daily.  -Advise yearly dental visits at minimum and regular eye exams   Follow up: see patient instructions     Patient Instructions  I really enjoyed getting to talk with you today! I am available on Tuesdays and Thursdays for virtual visits if you have any questions or concerns, or if I can be of any further assistance.   CHECKLIST FROM ANNUAL WELLNESS VISIT:  -Follow up (please call to schedule if not scheduled after visit):   -yearly for annual wellness visit with primary care office  Here is a list of your preventive care/health maintenance  measures and the plan for each if any are due:  PLAN For any measures below that may be due:  -get the vaccines at the pharmacy, please let us know when you do so that we can update  Health Maintenance  Topic Date Due   Zoster Vaccines- Shingrix (1 of 2) Never done   DTaP/Tdap/Td (1 - Tdap) 12/25/2012   COVID-19 Vaccine (3 - 2023-24 season) 07/22/2022   INFLUENZA VACCINE  06/22/2023   Medicare Annual Wellness (AWV)  03/22/2024   COLONOSCOPY (Pts 45-65yrs Insurance coverage will need to be confirmed)  04/25/2024   Pneumonia Vaccine 66+ Years old  Completed   DEXA SCAN  Completed   HPV VACCINES  Aged Out    -See a Education officer, community  at least yearly  -Get your eyes checked and then per your eye specialist's recommendations  -Other issues addressed today:   -I have included below further information regarding a healthy whole foods based diet, physical activity guidelines for adults, stress management and opportunities for social connections. I hope you find this information useful.   -----------------------------------------------------------------------------------------------------------------------------------------------------------------------------------------------------------------------------------------------------------  NUTRITION: -eat real food: lots of colorful vegetables (half the plate) and fruits -5-7 servings of vegetables and fruits per day (fresh or steamed is best), exp. 2 servings of vegetables with lunch and dinner and 2 servings of fruit per day. Berries and greens such as kale and collards are great choices.  -consume on a regular basis: whole grains (make sure first ingredient on label contains the word "whole"), fresh fruits, fish, nuts, seeds, healthy oils (such as olive oil, avocado oil, grape seed oil) -may eat small amounts of dairy and lean meat on occasion, but avoid processed meats such as ham, bacon, lunch meat, etc. -drink water -try to avoid fast food and  pre-packaged foods, processed meat -most experts advise limiting sodium to < 2300mg  per day, should limit further is any chronic conditions such as high blood pressure, heart disease, diabetes, etc. The American Heart Association advised that < 1500mg  is is ideal -try to avoid foods that contain any ingredients with names you do not recognize  -try to avoid sugar/sweets (except for the natural sugar that occurs in fresh fruit) -try to avoid sweet drinks -try to avoid white rice, white bread, pasta (unless whole grain), white or yellow potatoes  EXERCISE GUIDELINES FOR ADULTS: -if you wish to increase your physical activity, do so gradually and with the approval of your doctor -STOP and seek medical care immediately if you have any chest pain, chest discomfort or trouble breathing when starting or increasing exercise  -move and stretch your body, legs, feet and arms when sitting for long periods -Physical activity guidelines for optimal health in adults: -least 150 minutes per week of aerobic exercise (can talk, but not sing) once approved by your doctor, 20-30 minutes of sustained activity or two 10 minute episodes of sustained activity every day.  -resistance training at least 2 days per week if approved by your doctor -balance exercises 3+ days per week:   Stand somewhere where you have something sturdy to hold onto if you lose balance.    1) lift up on toes, start with 5x per day and work up to 20x   2) stand and lift on leg straight out to the side so that foot is a few inches of the floor, start with 5x each side and work up to 20x each side   3) stand on one foot, start with 5 seconds each side and work up to 20 seconds on each side  If you need ideas or help with getting more active:  -Silver sneakers https://tools.silversneakers.com  -Walk with a Doc: http://www.duncan-williams.com/  -try to include resistance (weight lifting/strength building) and balance exercises twice per week: or  the following link for ideas: http://castillo-powell.com/  BuyDucts.dk  STRESS MANAGEMENT: -can try meditating, or just sitting quietly with deep breathing while intentionally relaxing all parts of your body for 5 minutes daily -if you need further help with stress, anxiety or depression please follow up with your primary doctor or contact the wonderful folks at [facility]: [phone]  SOCIAL CONNECTIONS: -options in [place] if you wish to engage in more social and exercise related activities:  -Silver sneakers https://tools.silversneakers.com  -Walk with  a Doc: http://www.duncan-williams.com/  -Check out the [employer] Active Adults 50+ section on the [place] [employer] (hiking clubs, book clubs, cards and games, chess, exercise classes, aquatic classes and much more) - see the website for details: https://www.[place].gov/departments/parks-recreation/active-adults50  -YouTube has lots of exercise videos for different ages and abilities as well  -[facility] (a variety of indoor and outdoor inperson activities for adults). 810-522-1595. [address]  -Virtual Online Classes (a variety of topics): see seniorplanet.org or call [phone]  -consider volunteering at a school, hospice center, church, senior center or elsewhere           Terressa Koyanagi, DO

## 2023-04-20 ENCOUNTER — Other Ambulatory Visit: Payer: Self-pay | Admitting: Family Medicine

## 2023-04-20 DIAGNOSIS — I10 Essential (primary) hypertension: Secondary | ICD-10-CM

## 2023-06-06 ENCOUNTER — Ambulatory Visit (INDEPENDENT_AMBULATORY_CARE_PROVIDER_SITE_OTHER): Payer: Medicare Other | Admitting: Podiatry

## 2023-06-06 DIAGNOSIS — Z91199 Patient's noncompliance with other medical treatment and regimen due to unspecified reason: Secondary | ICD-10-CM

## 2023-06-06 NOTE — Progress Notes (Signed)
1. No-show for appointment     

## 2023-06-11 ENCOUNTER — Other Ambulatory Visit: Payer: Self-pay | Admitting: Family Medicine

## 2023-06-13 ENCOUNTER — Other Ambulatory Visit: Payer: Self-pay | Admitting: Adult Health

## 2023-06-29 ENCOUNTER — Other Ambulatory Visit: Payer: Self-pay | Admitting: Adult Health

## 2023-06-29 ENCOUNTER — Other Ambulatory Visit: Payer: Self-pay | Admitting: Family Medicine

## 2023-06-29 DIAGNOSIS — I10 Essential (primary) hypertension: Secondary | ICD-10-CM

## 2023-07-15 ENCOUNTER — Other Ambulatory Visit (HOSPITAL_COMMUNITY): Payer: Self-pay

## 2023-07-19 ENCOUNTER — Other Ambulatory Visit: Payer: Self-pay | Admitting: Family Medicine

## 2023-07-19 DIAGNOSIS — I10 Essential (primary) hypertension: Secondary | ICD-10-CM

## 2023-07-27 ENCOUNTER — Ambulatory Visit (INDEPENDENT_AMBULATORY_CARE_PROVIDER_SITE_OTHER): Payer: Medicare Other | Admitting: Adult Health

## 2023-07-27 ENCOUNTER — Encounter: Payer: Self-pay | Admitting: Adult Health

## 2023-07-27 VITALS — BP 112/72 | HR 65 | Ht 60.0 in | Wt 82.0 lb

## 2023-07-27 DIAGNOSIS — R413 Other amnesia: Secondary | ICD-10-CM

## 2023-07-27 DIAGNOSIS — G40409 Other generalized epilepsy and epileptic syndromes, not intractable, without status epilepticus: Secondary | ICD-10-CM | POA: Diagnosis not present

## 2023-07-27 NOTE — Progress Notes (Signed)
PATIENT: Kristine Francis DOB: Jul 23, 1941  REASON FOR VISIT: follow up HISTORY FROM: patient PRIMARY NEUROLOGIST: Dr. Vickey Huger  Chief Complaint  Patient presents with   Follow-up    RM 4 with spouse Onalee Hua Pt is well, reports no seizures since last visit. Memory is also stable.      HISTORY OF PRESENT ILLNESS: Today 07/27/23:  Kristine Francis is a 82 y.o. female with a history of memory disturbance and Seizures. Returns today for follow-up.  Overall she feels that she is doing well.  She feels that her memory has remained stable.  She continues to complete all ADLs independently.  Her husband manages her medications and appointments.  No change in mood or behavior.  She remains on Keppra for seizures.  Denies any seizure-like events.  Husband reports that some mornings she wakes up in she feels like her head is trembling.  He states that he can hold her head and it stops.  This does not happen on a daily basis.  Returns today for an evaluation.    07/26/22: Kristine Francis is an 82 year old female with a history of memory disturbance and seizures.  She returns today for follow-up.  Overall she feels that she has remained stable.  Denies any seizure events.  Continues on Keppra 500 mg twice a day.  She lives at home with her husband.  She is able to complete all ADLs independently. Husband manages medication and appointments.  She remains on Namenda 10 mg twice a day.  She returns today for an evaluation.  01/17/22: Kristine Francis is a 82 year old female with a history of stroke, memory disturbance and seizures.  She returns today for follow-up.  She denies any seizure events.  Continues on Keppra 500 mg twice a day.  She feels that overall her symptoms have remained stable.  She lives at home with her husband.  Able to complete all ADLs independently.  Her husband manages her medications and appointments.  She does not operate a motor vehicle.  She is currently not on any medication for her memory.   She returns today for evaluation.  HISTORY 06/08/21:   Kristine Francis is a 82 year old female with a history of stroke and seizures.  She returns today for follow-up.  She remains on Keppra 500 mg twice a day.  She denies any seizure events.  She is able to complete all ADLs independently.  She lives with her husband.  Reports that she is sometimes forgetful but overall feels that her memory has remained stable.  Her husband now manages all the finances.  She manages her appointments and medications however her husband does fill the pillbox for her.  She continues to have ongoing trouble with her gait.  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  She reports that she typically has falls inside her home.  She does not use a walker inside her home.  Fortunately she has not had any severe injuries.  She does use a walker or wheelchair outside of the home.  She returns today for an evaluation  REVIEW OF SYSTEMS: Out of a complete 14 system review of symptoms, the patient complains only of the following symptoms, and all other reviewed systems are negative.  ALLERGIES: Allergies  Allergen Reactions   Contrast Media [Iodinated Contrast Media] Other (See Comments)    Congestion .Marland KitchenMarland Kitchenpatient stated it irritated her eyes and voice   Iohexol Other (See Comments)    Gi upset   Iodine  Other (See Comments)   Lisinopril Cough    HOME MEDICATIONS: Outpatient Medications Prior to Visit  Medication Sig Dispense Refill   acetaminophen (TYLENOL) 325 MG tablet Take 1-2 tablets (325-650 mg total) by mouth every 4 (four) hours as needed for mild pain. (Patient taking differently: Take 325-650 mg by mouth as needed for mild pain.)     albuterol (VENTOLIN HFA) 108 (90 Base) MCG/ACT inhaler Inhale 2 puffs into the lungs every 4 (four) hours as needed for wheezing or shortness of breath. 1 each 0   amLODipine (NORVASC) 2.5 MG tablet TAKE 1 TABLET BY MOUTH EVERY DAY 90 tablet 0   EPINEPHrine 0.3 mg/0.3 mL  IJ SOAJ injection Inject 0.3 mg into the muscle as needed for anaphylaxis.     furosemide (LASIX) 20 MG tablet (Prior Auth: Rx ONG#:[id number])     ibuprofen (ADVIL) 800 MG tablet      levETIRAcetam (KEPPRA) 500 MG tablet TAKE 1 TABLET BY MOUTH TWICE A DAY 180 tablet 1   memantine (NAMENDA) 10 MG tablet TAKE 1 TABLET BY MOUTH TWICE A DAY 120 tablet 0   metoprolol tartrate (LOPRESSOR) 25 MG tablet TAKE 1 TABLET BY MOUTH TWICE A DAY 180 tablet 1   pantoprazole (PROTONIX) 20 MG tablet TAKE 1 TABLET BY MOUTH EVERY DAY 90 tablet 0   predniSONE (DELTASONE) 50 MG tablet Take 1 tablet (50 mg total) by mouth daily. 5 tablet 0   STARCH-MALTO DEXTRIN (THICK-IT) PACK Take 1 each by mouth 4 (four) times daily as needed. Thicken liquids to honey consistency 200 each 0   apixaban (ELIQUIS) 2.5 MG TABS tablet Take 1 tablet (2.5 mg total) by mouth 2 (two) times daily. (Patient not taking: Reported on 03/23/2023) 10 tablet 0   ELIQUIS 5 MG TABS tablet TAKE 1 TABLET TWICE A DAY (Patient not taking: Reported on 03/23/2023) 180 tablet 3   losartan (COZAAR) 50 MG tablet (Prior Auth: Rx NUU#:[id number]) (Patient not taking: Reported on 03/23/2023)     Multiple Vitamin (MULTIVITAMIN WITH MINERALS) TABS tablet Take 1 tablet by mouth daily. (Patient not taking: Reported on 03/23/2023)     No facility-administered medications prior to visit.    PAST MEDICAL HISTORY: Past Medical History:  Diagnosis Date   Allergy    Asthma    sees Dr. Eileen [facility] - as child per pt   At high risk for falls    unstable gait    Ataxia    Cataract    removed bilaterally    Diverticulosis    Emphysema    Gait abnormality 05/14/2013   Kristine Francis a patient of Dr. Debby Bud presented first interpreter thousand 13 with a gait dysfunction, she also had an episodic confusion and Loss device. Exam found a mild nystagmus and she was referred to ophthalmology on 03-2812, brain MRI was normal nystagmus was a secondary diagnosis to vestibulitis, ear nose  and throat has followed and had seen the patient. At the time of the nystagmus the patie   GERD (gastroesophageal reflux disease)    Hepatic steatosis    Hyperlipidemia    on atorvastatin    Hypertension    on losartan    Iron deficiency anemia, unspecified    Kidney cysts 11/16/12   small cyst on left   Nystagmus, end-position    Osteopenia    Renal cyst    Stroke (HCC) 01/2017   mild     PAST SURGICAL HISTORY: Past Surgical History:  Procedure Laterality Date   ANAL RECTAL MANOMETRY  N/A 12/20/2017   Procedure: ANO RECTAL MANOMETRY;  Surgeon: Napoleon Form, MD;  Location: [facility];  Service: Endoscopy;  Laterality: N/A;   BREAST BIOPSY Bilateral 1970's   CATARACT EXTRACTION Bilateral LT:02/14/13,RT:02/21/13   CERVICAL SPINE SURGERY  2010   COLONOSCOPY  03-12-14   per Dr. Juanda Chance, diverticulosis only, repeat 5 yrs    EYE SURGERY Bilateral 01/2013&02/2013   left then right   HERNIA REPAIR  Oct '14-left, remote-right   years ago right; left done '14   SKIN CANCER EXCISION  05/13/13   FACE, basal cell   TONSILLECTOMY      FAMILY HISTORY: Family History  Problem Relation Age of Onset   Pancreatic cancer Mother    Throat cancer Mother    Colon cancer Father    Coronary artery disease Father    Heart block Father    Ovarian cancer Maternal Aunt    Colon cancer Paternal Aunt        x 2   Cancer - Lung Paternal Aunt    Breast cancer Paternal Aunt    Colon cancer Paternal Aunt    Colon cancer Paternal Grandmother    Heart attack Paternal Grandfather    Esophageal cancer Other    Colon polyps Neg Hx    Rectal cancer Neg Hx    Stomach cancer Neg Hx    Dementia Neg Hx     SOCIAL HISTORY: Social History   Socioeconomic History   Marital status: Married    Spouse name: Not on file   Number of children: 3   Years of education: 16   Highest education level: Not on file  Occupational History   Occupation: newspaper Programmer, multimedia    Comment: reitred    Employer:  RETIRED  Tobacco Use   Smoking status: Never   Smokeless tobacco: Never  Vaping Use   Vaping status: Never Used  Substance and Sexual Activity   Alcohol use: No    Alcohol/week: 0.0 standard drinks of alcohol   Drug use: No   Sexual activity: Yes    Partners: Male  Other Topics Concern   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 married - 1965. 3 sons - '66, '69, '70. Sons in good health. Marriage in good health. Enjoys retirement - remains active.         Social Determinants of Health   Financial Resource Strain: Low Risk  (03/21/2022)   Overall Financial Resource Strain (CARDIA)    Difficulty of Paying Living Expenses: Not hard at all  Food Insecurity: No Food Insecurity (03/21/2022)   Hunger Vital Sign    Worried About Running Out of Food in the Last Year: Never true    Ran Out of Food in the Last Year: Never true  Transportation Needs: No Transportation Needs (03/21/2022)   PRAPARE - Administrator, Civil Service (Medical): No    Lack of Transportation (Non-Medical): No  Physical Activity: Inactive (03/21/2022)   Exercise Vital Sign    Days of Exercise per Week: 0 days    Minutes of Exercise per Session: 0 min  Stress: No Stress Concern Present (03/21/2022)   [employer] of Occupational Health - Occupational Stress Questionnaire    Feeling of Stress : Not at all  Social Connections: Socially Integrated (03/21/2022)   Social Connection and Isolation Panel [NHANES]    Frequency of Communication with Friends and Family: More than three times  a week    Frequency of Social Gatherings with Friends and Family: More than three times a week    Attends Religious Services: More than 4 times per year    Active Member of [employer] or Organizations: Yes    Attends Engineer, structural: More than 4 times per year    Marital Status: Married  Catering manager Violence: Not At Risk (03/21/2022)   Humiliation,  Afraid, Rape, and Kick questionnaire    Fear of Current or Ex-Partner: No    Emotionally Abused: No    Physically Abused: No    Sexually Abused: No      PHYSICAL EXAM  Vitals:   07/27/23 1253  BP: 112/72  Pulse: 65  Weight: 82 lb (37.2 kg)  Height: 5' (1.524 m)    Body mass index is 16.01 kg/m.     07/27/2023    1:05 PM 07/26/2022    1:10 PM 01/17/2022   11:04 AM  MMSE - Mini Mental State Exam  Orientation to time 3 5 1   Orientation to Place 3 4 4   Registration 3 3 3   Attention/ Calculation 2 1 1   Recall 2 3 3   Language- name 2 objects 2 2 2   Language- repeat 1 1 0  Language- follow 3 step command 3 3 3   Language- read & follow direction 1 1 1   Write a sentence 1 1 0  Copy design 0 0 0  Total score 21 24 18      Generalized: Well developed, in no acute distress   Neurological examination  Mentation: Alert oriented to time, place, history taking. Follows all commands speech and language fluent Cranial nerve II-XII: Pupils were equal round reactive to light. Extraocular movements were full, visual field were full on confrontational test. Facial sensation and strength were normal.  Head turning and shoulder shrug  were normal and symmetric. Motor: The motor testing reveals 5 over 5 strength of all 4 extremities. Good symmetric motor tone is noted throughout.  Sensory: Sensory testing is intact to soft touch on all 4 extremities. No evidence of extinction is noted.     DIAGNOSTIC DATA (LABS, IMAGING, TESTING) - I reviewed patient records, labs, notes, testing and imaging myself where available.  Lab Results  Component Value Date   WBC 7.8 09/11/2022   HGB 12.4 09/11/2022   HCT 37.6 09/11/2022   MCV 89.7 09/11/2022   PLT 260 09/11/2022      Component Value Date/Time   NA 136 09/11/2022 0415   NA 140 09/07/2021 1011   K 3.7 09/11/2022 0415   CL 99 09/11/2022 0415   CO2 28 09/11/2022 0415   GLUCOSE 136 (H) 09/11/2022 0415   BUN 17 09/11/2022 0415   BUN 11  09/07/2021 1011   CREATININE 0.56 09/11/2022 0415   CALCIUM 9.5 09/11/2022 0415   PROT 7.1 04/03/2022 1824   PROT 7.1 09/07/2021 1011   ALBUMIN 4.0 04/03/2022 1824   ALBUMIN 4.5 09/07/2021 1011   AST 23 04/03/2022 1824   ALT 12 04/03/2022 1824   ALKPHOS 66 04/03/2022 1824   BILITOT 1.4 (H) 04/03/2022 1824   BILITOT 1.3 (H) 09/07/2021 1011   GFRNONAA >60 09/11/2022 0415   GFRAA >60 01/26/2017 1640   Lab Results  Component Value Date   CHOL 202 (H) 04/15/2016   HDL 75.50 04/15/2016   LDLCALC 116 (H) 04/15/2016   LDLDIRECT 129.3 12/07/2010   TRIG 58 11/26/2020   CHOLHDL 3 04/15/2016   Lab Results  Component Value  Date   HGBA1C 5.5 11/25/2020   Lab Results  Component Value Date   VITAMINB12 347 02/01/2021   Lab Results  Component Value Date   TSH 2.600 02/01/2021      ASSESSMENT AND PLAN 82 y.o. year old female  has a past medical history of Allergy, Asthma, At high risk for falls, Ataxia, Cataract, Diverticulosis, Emphysema, Gait abnormality (05/14/2013), GERD (gastroesophageal reflux disease), Hepatic steatosis, Hyperlipidemia, Hypertension, Iron deficiency anemia, unspecified, Kidney cysts (11/16/12), Nystagmus, end-position, Osteopenia, Renal cyst, and Stroke (HCC) (01/2017). here with :  Seizures  Continue Keppra 500 mg twice a day  2.  Memory disturbance  MMSE 21/30 previously 24/30 Continue Namenda 10 mg BID   FU In 1 year or sooner if needed      Butch Penny, MSN, NP-C 07/27/2023, 1:10 PM [facility] [address] [place], [place] [address] [phone]

## 2023-08-21 ENCOUNTER — Emergency Department (HOSPITAL_COMMUNITY): Payer: Medicare Other

## 2023-08-21 ENCOUNTER — Encounter (HOSPITAL_COMMUNITY): Payer: Self-pay | Admitting: Emergency Medicine

## 2023-08-21 ENCOUNTER — Other Ambulatory Visit: Payer: Self-pay

## 2023-08-21 ENCOUNTER — Emergency Department (HOSPITAL_COMMUNITY)
Admission: EM | Admit: 2023-08-21 | Discharge: 2023-08-21 | Disposition: A | Discharge status: Home / Self Care | Payer: Medicare Other | Attending: Emergency Medicine | Admitting: Emergency Medicine

## 2023-08-21 DIAGNOSIS — R0902 Hypoxemia: Secondary | ICD-10-CM | POA: Diagnosis not present

## 2023-08-21 DIAGNOSIS — M542 Cervicalgia: Secondary | ICD-10-CM | POA: Diagnosis not present

## 2023-08-21 DIAGNOSIS — I10 Essential (primary) hypertension: Secondary | ICD-10-CM | POA: Diagnosis not present

## 2023-08-21 DIAGNOSIS — R1310 Dysphagia, unspecified: Secondary | ICD-10-CM | POA: Insufficient documentation

## 2023-08-21 DIAGNOSIS — R079 Chest pain, unspecified: Secondary | ICD-10-CM | POA: Diagnosis not present

## 2023-08-21 DIAGNOSIS — R059 Cough, unspecified: Secondary | ICD-10-CM | POA: Diagnosis not present

## 2023-08-21 LAB — COMPREHENSIVE METABOLIC PANEL
ALT: 16 U/L (ref 0–44)
AST: 23 U/L (ref 15–41)
Albumin: 3.7 g/dL (ref 3.5–5.0)
Alkaline Phosphatase: 79 U/L (ref 38–126)
Anion gap: 11 (ref 5–15)
BUN: 10 mg/dL (ref 8–23)
CO2: 30 mmol/L (ref 22–32)
Calcium: 9.5 mg/dL (ref 8.9–10.3)
Chloride: 98 mmol/L (ref 98–111)
Creatinine, Ser: 0.73 mg/dL (ref 0.44–1.00)
GFR, Estimated: >60 mL/min (ref >60)
Glucose, Bld: 160 mg/dL — ABNORMAL HIGH (ref 70–99)
Potassium: 3.1 mmol/L — ABNORMAL LOW (ref 3.5–5.1)
Sodium: 139 mmol/L (ref 135–145)
Total Bilirubin: 1.1 mg/dL (ref 0.3–1.2)
Total Protein: 7.1 g/dL (ref 6.5–8.1)

## 2023-08-21 LAB — CBC WITH DIFFERENTIAL/PLATELET
Abs Immature Granulocytes: 0.02 10*3/uL (ref 0.00–0.07)
Basophils Absolute: 0 10*3/uL (ref 0.0–0.1)
Basophils Relative: 1 %
Eosinophils Absolute: 0.2 10*3/uL (ref 0.0–0.5)
Eosinophils Relative: 2 %
HCT: 40.6 % (ref 36.0–46.0)
Hemoglobin: 13.4 g/dL (ref 12.0–15.0)
Immature Granulocytes: 0 %
Lymphocytes Relative: 17 %
Lymphs Abs: 1.3 10*3/uL (ref 0.7–4.0)
MCH: 30.3 pg (ref 26.0–34.0)
MCHC: 33 g/dL (ref 30.0–36.0)
MCV: 91.9 fL (ref 80.0–100.0)
Monocytes Absolute: 0.7 10*3/uL (ref 0.1–1.0)
Monocytes Relative: 9 %
Neutro Abs: 5.3 10*3/uL (ref 1.7–7.7)
Neutrophils Relative %: 71 %
Platelets: 317 10*3/uL (ref 150–400)
RBC: 4.42 MIL/uL (ref 3.87–5.11)
RDW: 12.7 % (ref 11.5–15.5)
WBC: 7.5 10*3/uL (ref 4.0–10.5)
nRBC: 0 % (ref 0.0–0.2)

## 2023-08-21 LAB — TROPONIN I (HIGH SENSITIVITY): Troponin I (High Sensitivity): 4 ng/L (ref <18)

## 2023-08-21 LAB — LIPASE, BLOOD: Lipase: 31 U/L (ref 11–51)

## 2023-08-21 MED ORDER — ALUM & MAG HYDROXIDE-SIMETH 200-200-20 MG/5ML PO SUSP
30.0000 mL | Freq: Once | ORAL | Status: AC
  Administered 2023-08-21: 30 mL via ORAL
  Filled 2023-08-21: qty 30

## 2023-08-21 MED ORDER — SUCRALFATE 1 G PO TABS
1.0000 g | ORAL_TABLET | Freq: Once | ORAL | Status: AC
  Administered 2023-08-21: 1 g via ORAL
  Filled 2023-08-21: qty 1

## 2023-08-21 MED ORDER — LIDOCAINE VISCOUS HCL 2 % MT SOLN
15.0000 mL | Freq: Once | OROMUCOSAL | Status: AC
  Administered 2023-08-21: 15 mL via OROMUCOSAL
  Filled 2023-08-21: qty 15

## 2023-08-21 NOTE — ED Notes (Signed)
Pt was able to swallow water with straw without difficulty. Pt states the feeling in her throat improved after water.

## 2023-08-21 NOTE — ED Notes (Signed)
Said she felt like something was in her throat but she swallowed a whole cup of water without stopping. No complaints at this time.

## 2023-08-21 NOTE — ED Provider Notes (Signed)
[facility] AT [facility] Provider Note   CSN: [id number] Arrival date & time: 08/21/23  0445     History  Chief Complaint  Patient presents with   Neck Pain    Kristine Francis is a 82 y.o. female.  Patient presents to the emergency department with symptoms of feeling like something is caught in her throat.  Patient reports that she got up to go to the bathroom around 3 AM and felt like there was something caught in her esophagus.  No difficulty breathing.  No associated pain.  She ate dinner around 6 PM yesterday, did not feel any unusual symptoms with eating.  She went to bed feeling normal.       Home Medications Prior to Admission medications   Medication Sig Start Date End Date Taking? Authorizing Provider  acetaminophen (TYLENOL) 325 MG tablet Take 1-2 tablets (325-650 mg total) by mouth every 4 (four) hours as needed for mild pain. Patient taking differently: Take 325-650 mg by mouth as needed for mild pain. 12/11/20   Love, Evlyn Kanner, PA-C  albuterol (VENTOLIN HFA) 108 (90 Base) MCG/ACT inhaler Inhale 2 puffs into the lungs every 4 (four) hours as needed for wheezing or shortness of breath. 09/11/22   Dione Booze, MD  amLODipine (NORVASC) 2.5 MG tablet TAKE 1 TABLET BY MOUTH EVERY DAY 06/29/23   Nelwyn Salisbury, MD  EPINEPHrine 0.3 mg/0.3 mL IJ SOAJ injection Inject 0.3 mg into the muscle as needed for anaphylaxis. 08/21/20   [provider]  furosemide (LASIX) 20 MG tablet (Prior Auth: Rx [id number])    [provider]  ibuprofen (ADVIL) 800 MG tablet  05/19/20   [provider]  levETIRAcetam (KEPPRA) 500 MG tablet TAKE 1 TABLET BY MOUTH TWICE A DAY 03/14/23   Dohmeier, Porfirio Mylar, MD  memantine (NAMENDA) 10 MG tablet TAKE 1 TABLET BY MOUTH TWICE A DAY 06/29/23   Butch Penny, NP  metoprolol tartrate (LOPRESSOR) 25 MG tablet TAKE 1 TABLET BY MOUTH TWICE A DAY 07/19/23   Nelwyn Salisbury, MD  pantoprazole (PROTONIX) 20  MG tablet TAKE 1 TABLET BY MOUTH EVERY DAY 06/12/23   Nelwyn Salisbury, MD  predniSONE (DELTASONE) 50 MG tablet Take 1 tablet (50 mg total) by mouth daily. 09/11/22   Dione Booze, MD  STARCH-MALTO DEXTRIN (THICK-IT) PACK Take 1 each by mouth 4 (four) times daily as needed. Thicken liquids to honey consistency 12/24/20   Love, Evlyn Kanner, PA-C      Allergies    Contrast media [iodinated contrast media], Iohexol, Iodine, and Lisinopril    Review of Systems   Review of Systems  Physical Exam Updated Vital Signs BP (!) 179/108 (BP Location: Right Arm)   Pulse (!) 57   Temp (!) 97.3 F (36.3 C) (Oral)   Resp 20   SpO2 100%  Physical Exam Vitals and nursing note reviewed.  Constitutional:      General: She is not in acute distress.    Appearance: She is well-developed.  HENT:     Head: Normocephalic and atraumatic.     Mouth/Throat:     Mouth: Mucous membranes are moist.  Eyes:     General: Vision grossly intact. Gaze aligned appropriately.     Extraocular Movements: Extraocular movements intact.     Conjunctiva/sclera: Conjunctivae normal.  Cardiovascular:     Rate and Rhythm: Normal rate and regular rhythm.     Pulses: Normal pulses.     Heart sounds: Normal  heart sounds, S1 normal and S2 normal. No murmur heard.    No friction rub. No gallop.  Pulmonary:     Effort: Pulmonary effort is normal. No respiratory distress.     Breath sounds: Normal breath sounds.  Abdominal:     General: Bowel sounds are normal.     Palpations: Abdomen is soft.     Tenderness: There is no abdominal tenderness. There is no guarding or rebound.     Hernia: No hernia is present.  Musculoskeletal:        General: No swelling.     Cervical back: Full passive range of motion without pain, normal range of motion and neck supple. No spinous process tenderness or muscular tenderness. Normal range of motion.     Right lower leg: No edema.     Left lower leg: No edema.  Skin:    General: Skin is warm and  dry.     Capillary Refill: Capillary refill takes less than 2 seconds.     Findings: No ecchymosis, erythema, rash or wound.  Neurological:     General: No focal deficit present.     Mental Status: She is alert and oriented to person, place, and time.     GCS: GCS eye subscore is 4. GCS verbal subscore is 5. GCS motor subscore is 6.     Cranial Nerves: Cranial nerves 2-12 are intact.     Sensory: Sensation is intact.     Motor: Motor function is intact.     Coordination: Coordination is intact.  Psychiatric:        Attention and Perception: Attention normal.        Mood and Affect: Mood normal.        Speech: Speech normal.        Behavior: Behavior normal.     ED Results / Procedures / Treatments   Labs (all labs ordered are listed, but only abnormal results are displayed) Labs Reviewed  CBC WITH DIFFERENTIAL/PLATELET  COMPREHENSIVE METABOLIC PANEL  LIPASE, BLOOD  URINALYSIS, ROUTINE W REFLEX MICROSCOPIC  TROPONIN I (HIGH SENSITIVITY)    EKG EKG Interpretation Date/Time:  Monday August 21 2023 05:02:09 EDT Ventricular Rate:  58 PR Interval:    QRS Duration:  93 QT Interval:  501 QTC Calculation: 493 R Axis:   74  Text Interpretation: undetermined rhythm due to poor data quality Abnormal R-wave progression, early transition Abnormal T, consider ischemia, lateral leads Artifact in lead(s) II III aVR aVL aVF V1 V2 V3 V4 V5 V6 and baseline wander in lead(s) V4 Confirmed by Gilda Crease [phone]) on 08/21/2023 5:04:19 AM  Radiology DG Chest Port 1 View  Result Date: 08/21/2023 CLINICAL DATA:  Chest pain EXAM: PORTABLE CHEST 1 VIEW COMPARISON:  09/11/2022 FINDINGS: Lungs are hyperexpanded. The lungs are clear without focal pneumonia, edema, pneumothorax or pleural effusion. The cardiopericardial silhouette is within normal limits for size. Bones are diffusely demineralized. Telemetry leads overlie the chest. IMPRESSION: Hyperexpansion without acute cardiopulmonary  findings. Electronically Signed   By: Kennith Center M.D.   On: 08/21/2023 05:24    Procedures Procedures    Medications Ordered in ED Medications - No data to display  ED Course/ Medical Decision Making/ A&P                                 Medical Decision Making Amount and/or Complexity of Data Reviewed Labs: ordered. Radiology: ordered.  Patient returns to the emergency department stating that he feels like there is something caught in her throat.  Patient woke up at 3 AM with this sensation.  She did not have the sensation when she went to bed.  She was able to eat her dinner at 6 PM without difficulty.  Patient comfortable on arrival.  She is not experiencing any chest pain or shortness of breath.  She was able to drink a large volume of water comfortably, doubt esophageal impaction.  Will evaluate for other possible etiology, cardiac evaluation, etc.  No stroke signs at this time.  She does have a history of dysphagia, suspect that symptoms currently are related to this.  Will sign out to oncoming ER physician to follow results.        Final Clinical Impression(s) / ED Diagnoses Final diagnoses:  Dysphagia, unspecified type    Rx / DC Orders ED Discharge Orders     None         Gilda Crease, MD 08/21/23 [phone]

## 2023-08-21 NOTE — ED Triage Notes (Signed)
Pt arrived via EMS from home. Pt woke up to use restroom and felt like there is something stuck in her throat. Last Po intake was 1800 last night per EMS. Pt denies difficulty breathing.

## 2023-09-24 ENCOUNTER — Other Ambulatory Visit: Payer: Self-pay | Admitting: Family Medicine

## 2023-09-24 ENCOUNTER — Other Ambulatory Visit: Payer: Self-pay | Admitting: Neurology

## 2023-09-24 DIAGNOSIS — I10 Essential (primary) hypertension: Secondary | ICD-10-CM

## 2023-09-27 ENCOUNTER — Telehealth: Payer: Self-pay

## 2023-09-27 NOTE — Telephone Encounter (Signed)
Transition Care Management Unsuccessful Follow-up Telephone Call  Date of discharge and from where:  [facility] 9/30  Attempts:  2nd Attempt  Reason for unsuccessful TCM follow-up call:  No answer/busy   Lenard Forth [place]  [facility], [facility] Guide, Phone: [phone] Website: [email or web address]

## 2023-09-27 NOTE — Telephone Encounter (Signed)
Transition Care Management Unsuccessful Follow-up Telephone Call  Date of discharge and from where:  [facility] 9/30  Attempts:  1st Attempt  Reason for unsuccessful TCM follow-up call:  No answer/busy   Lenard Forth [place]  [facility], [facility] Guide, Phone: [phone] Website: [email or web address]

## 2023-10-11 ENCOUNTER — Other Ambulatory Visit: Payer: Self-pay | Admitting: Adult Health

## 2023-12-24 ENCOUNTER — Other Ambulatory Visit: Payer: Self-pay | Admitting: Family Medicine

## 2023-12-24 DIAGNOSIS — I10 Essential (primary) hypertension: Secondary | ICD-10-CM

## 2024-03-16 ENCOUNTER — Other Ambulatory Visit: Payer: Self-pay | Admitting: Neurology

## 2024-03-18 NOTE — Telephone Encounter (Signed)
 Last seen on 07/27/23 Follow up scheduled on 08/08/24

## 2024-03-23 ENCOUNTER — Other Ambulatory Visit: Payer: Self-pay | Admitting: Family Medicine

## 2024-03-23 DIAGNOSIS — I10 Essential (primary) hypertension: Secondary | ICD-10-CM

## 2024-04-08 ENCOUNTER — Other Ambulatory Visit: Payer: Self-pay | Admitting: Adult Health

## 2024-04-08 ENCOUNTER — Other Ambulatory Visit: Payer: Self-pay | Admitting: Family Medicine

## 2024-04-08 DIAGNOSIS — I10 Essential (primary) hypertension: Secondary | ICD-10-CM

## 2024-05-30 ENCOUNTER — Other Ambulatory Visit: Payer: Self-pay | Admitting: Family Medicine

## 2024-05-30 DIAGNOSIS — I10 Essential (primary) hypertension: Secondary | ICD-10-CM

## 2024-06-22 ENCOUNTER — Other Ambulatory Visit: Payer: Self-pay | Admitting: Family Medicine

## 2024-06-22 DIAGNOSIS — I10 Essential (primary) hypertension: Secondary | ICD-10-CM

## 2024-06-23 ENCOUNTER — Other Ambulatory Visit: Payer: Self-pay | Admitting: Family Medicine

## 2024-06-23 DIAGNOSIS — I10 Essential (primary) hypertension: Secondary | ICD-10-CM

## 2024-07-07 ENCOUNTER — Other Ambulatory Visit: Payer: Self-pay | Admitting: Adult Health

## 2024-08-08 ENCOUNTER — Ambulatory Visit: Payer: Medicare Other | Admitting: Adult Health

## 2024-08-08 VITALS — BP 123/81 | HR 62 | Ht 61.0 in | Wt 82.0 lb

## 2024-08-08 DIAGNOSIS — F03B0 Unspecified dementia, moderate, without behavioral disturbance, psychotic disturbance, mood disturbance, and anxiety: Secondary | ICD-10-CM | POA: Diagnosis not present

## 2024-08-08 DIAGNOSIS — G40409 Other generalized epilepsy and epileptic syndromes, not intractable, without status epilepticus: Secondary | ICD-10-CM | POA: Diagnosis not present

## 2024-08-08 NOTE — Progress Notes (Signed)
 PATIENT: Kristine Francis DOB: [birth year]  REASON FOR VISIT: follow up HISTORY FROM: patient PRIMARY NEUROLOGIST: Dr. Chalice  Chief Complaint  Patient presents with   Follow-up    Rm 4, husband. seizures/memory.   No seizures.  Mmse 18/30 today. ..     HISTORY OF PRESENT ILLNESS: Today 08/08/24:  Kristine Francis is a 83 y.o. female with a history of memory disturbance and seizures. Returns today for follow-up.  Overall she reports that she is doing relatively well.  She feels that her memory has remained stable.  She lives at home with her husband.  Able to complete all ADLs independently.  Her husband helps her manage her medications and appointments.  She is still able to help with household chores.  She has not had any seizure-like episodes.  Returns today for an evaluation.   07/27/23: Kristine Francis is a 83 y.o. female with a history of memory disturbance and Seizures. Returns today for follow-up.  Overall she feels that she is doing well.  She feels that her memory has remained stable.  She continues to complete all ADLs independently.  Her husband manages her medications and appointments.  No change in mood or behavior.  She remains on Keppra  for seizures.  Denies any seizure-like events.  Husband reports that some mornings she wakes up in she feels like her head is trembling.  He states that he can hold her head and it stops.  This does not happen on a daily basis.  Returns today for an evaluation.    07/26/22: Kristine Francis is an 83 year old female with a history of memory disturbance and seizures.  She returns today for follow-up.  Overall she feels that she has remained stable.  Denies any seizure events.  Continues on Keppra  500 mg twice a day.  She lives at home with her husband.  She is able to complete all ADLs independently. Husband manages medication and appointments.  She remains on Namenda  10 mg twice a day.  She returns today for an evaluation.  01/17/22: Kristine Francis  is a 83 year old female with a history of stroke, memory disturbance and seizures.  She returns today for follow-up.  She denies any seizure events.  Continues on Keppra  500 mg twice a day.  She feels that overall her symptoms have remained stable.  She lives at home with her husband.  Able to complete all ADLs independently.  Her husband manages her medications and appointments.  She does not operate a motor vehicle.  She is currently not on any medication for her memory.  She returns today for evaluation.  HISTORY 06/08/21:   Kristine Francis is a 83 year old female with a history of stroke and seizures.  She returns today for follow-up.  She remains on Keppra  500 mg twice a day.  She denies any seizure events.  She is able to complete all ADLs independently.  She lives with her husband.  Reports that she is sometimes forgetful but overall feels that her memory has remained stable.  Her husband now manages all the finances.  She manages her appointments and medications however her husband does fill the pillbox for her.  She continues to have ongoing trouble with her gait.  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  She reports that she typically has falls inside her home.  She does not use a walker inside her home.  Fortunately she has not had any severe injuries.  She does use  a walker or wheelchair outside of the home.  She returns today for an evaluation  REVIEW OF SYSTEMS: Out of a complete 14 system review of symptoms, the patient complains only of the following symptoms, and all other reviewed systems are negative.  ALLERGIES: Allergies  Allergen Reactions   Contrast Media [Iodinated Contrast Media] Other (See Comments)    Congestion .SABRASABRApatient stated it irritated her eyes and voice   Iohexol  Other (See Comments)    Gi upset   Iodine Other (See Comments)   Lisinopril  Cough    HOME MEDICATIONS: Outpatient Medications Prior to Visit  Medication Sig Dispense Refill    acetaminophen  (TYLENOL ) 325 MG tablet Take 1-2 tablets (325-650 mg total) by mouth every 4 (four) hours as needed for mild pain. (Patient taking differently: Take 325-650 mg by mouth as needed for mild pain.)     albuterol  (VENTOLIN  HFA) 108 (90 Base) MCG/ACT inhaler Inhale 2 puffs into the lungs every 4 (four) hours as needed for wheezing or shortness of breath. 1 each 0   amLODipine  (NORVASC ) 2.5 MG tablet TAKE 1 TABLET BY MOUTH EVERY DAY 90 tablet 3   EPINEPHrine 0.3 mg/0.3 mL IJ SOAJ injection Inject 0.3 mg into the muscle as needed for anaphylaxis.     furosemide  (LASIX ) 20 MG tablet (Prior Auth: Rx Mzq#:[id number])     ibuprofen (ADVIL) 800 MG tablet      levETIRAcetam  (KEPPRA ) 500 MG tablet TAKE 1 TABLET BY MOUTH TWICE A DAY 180 tablet 1   memantine  (NAMENDA ) 10 MG tablet TAKE 1 TABLET BY MOUTH TWICE A DAY 180 tablet 0   metoprolol  tartrate (LOPRESSOR ) 25 MG tablet TAKE 1 TABLET BY MOUTH TWICE A DAY 180 tablet 3   pantoprazole  (PROTONIX ) 20 MG tablet TAKE 1 TABLET BY MOUTH EVERY DAY 90 tablet 0   STARCH-MALTO DEXTRIN (THICK-IT) PACK Take 1 each by mouth 4 (four) times daily as needed. Thicken liquids to honey consistency 200 each 0   predniSONE  (DELTASONE ) 50 MG tablet Take 1 tablet (50 mg total) by mouth daily. (Patient not taking: Reported on 08/08/2024) 5 tablet 0   No facility-administered medications prior to visit.    PAST MEDICAL HISTORY: Past Medical History:  Diagnosis Date   Allergy    Asthma    sees Dr. Brian Shin - as child per pt   At high risk for falls    unstable gait    Ataxia    Cataract    removed bilaterally    Diverticulosis    Emphysema    Gait abnormality 05/14/2013   KristineFrancis a patient of Dr. Harlow presented first interpreter thousand 13 with a gait dysfunction, she also had an episodic confusion and Loss device. Exam found a mild nystagmus and she was referred to ophthalmology on 03-2812, brain MRI was normal nystagmus was a secondary diagnosis to  vestibulitis, ear nose and throat has followed and had seen the patient. At the time of the nystagmus the patie   GERD (gastroesophageal reflux disease)    Hepatic steatosis    Hyperlipidemia    on atorvastatin     Hypertension    on losartan     Iron deficiency anemia, unspecified    Kidney cysts 11/16/12   small cyst on left   Nystagmus, end-position    Osteopenia    Renal cyst    Stroke (HCC) 01/2017   mild     PAST SURGICAL HISTORY: Past Surgical History:  Procedure Laterality Date   ANAL RECTAL MANOMETRY N/A 12/20/2017  Procedure: ANO RECTAL MANOMETRY;  Surgeon: Shila Gustav GAILS, MD;  Location: [facility];  Service: Endoscopy;  Laterality: N/A;   BREAST BIOPSY Bilateral 1970's   CATARACT EXTRACTION Bilateral LT:02/14/13,RT:02/21/13   CERVICAL SPINE SURGERY  2010   COLONOSCOPY  03-12-14   per Dr. Obie, diverticulosis only, repeat 5 yrs    EYE SURGERY Bilateral 01/2013&02/2013   left then right   HERNIA REPAIR  Oct '14-left, remote-right   years ago right; left done '14   SKIN CANCER EXCISION  05/13/13   FACE, basal cell   TONSILLECTOMY      FAMILY HISTORY: Family History  Problem Relation Age of Onset   Pancreatic cancer Mother    Throat cancer Mother    Colon cancer Father    Coronary artery disease Father    Heart block Father    Ovarian cancer Maternal Aunt    Colon cancer Paternal Aunt        x 2   Cancer - Lung Paternal Aunt    Breast cancer Paternal Aunt    Colon cancer Paternal Aunt    Colon cancer Paternal Grandmother    Heart attack Paternal Grandfather    Esophageal cancer Other    Colon polyps Neg Hx    Rectal cancer Neg Hx    Stomach cancer Neg Hx    Dementia Neg Hx     SOCIAL HISTORY: Social History   Socioeconomic History   Marital status: Married    Spouse name: Not on file   Number of children: 3   Years of education: 16   Highest education level: Not on file  Occupational History   Occupation: newspaper Programmer, multimedia    Comment:  [employer]    Employer: RETIRED  Tobacco Use   Smoking status: Never   Smokeless tobacco: Never  Vaping Use   Vaping status: Never Used  Substance and Sexual Activity   Alcohol use: No    Alcohol/week: 0.0 standard drinks of alcohol   Drug use: No   Sexual activity: Yes    Partners: Male  Other Topics Concern   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 married - 1965. 3 sons - '66, '69, '70. Sons in good health. Marriage in good health. Enjoys retirement - remains active.         Social Drivers of Corporate investment banker Strain: Low Risk  (03/21/2022)   Overall Financial Resource Strain (CARDIA)    Difficulty of Paying Living Expenses: Not hard at all  Food Insecurity: No Food Insecurity (03/21/2022)   Hunger Vital Sign    Worried About Running Out of Food in the Last Year: Never true    Ran Out of Food in the Last Year: Never true  Transportation Needs: No Transportation Needs (03/21/2022)   PRAPARE - Administrator, Civil Service (Medical): No    Lack of Transportation (Non-Medical): No  Physical Activity: Inactive (03/21/2022)   Exercise Vital Sign    Days of Exercise per Week: 0 days    Minutes of Exercise per Session: 0 min  Stress: No Stress Concern Present (03/21/2022)   [employer] of Occupational Health - Occupational Stress Questionnaire    Feeling of Stress : Not at all  Social Connections: Socially Integrated (03/21/2022)   Social Connection and Isolation Panel    Frequency of Communication with Friends and Family: More than three times a week  Frequency of Social Gatherings with Friends and Family: More than three times a week    Attends Religious Services: More than 4 times per year    Active Member of [employer] or Organizations: Yes    Attends Engineer, structural: More than 4 times per year    Marital Status: Married  Catering manager Violence: Not At Risk (03/21/2022)    Humiliation, Afraid, Rape, and Kick questionnaire    Fear of Current or Ex-Partner: No    Emotionally Abused: No    Physically Abused: No    Sexually Abused: No      PHYSICAL EXAM  Vitals:   08/08/24 1331  BP: 123/81  Pulse: 62  Weight: 82 lb (37.2 kg)  Height: 5' 1 (1.549 m)     Body mass index is 15.49 kg/m.     08/08/2024    1:36 PM 07/27/2023    1:05 PM 07/26/2022    1:10 PM  MMSE - Mini Mental State Exam  Orientation to time 1 3 5   Orientation to Place 5 3 4   Registration 3 3 3   Attention/ Calculation 0 2 1  Recall 2 2 3   Language- name 2 objects 2 2 2   Language- repeat 1 1 1   Language- follow 3 step command 3 3 3   Language- read & follow direction 1 1 1   Write a sentence 0 1 1  Copy design 0 0 0  Total score 18 21 24      Generalized: Well developed, in no acute distress   Neurological examination  Mentation: Alert oriented to time, place, history taking. Follows all commands speech and language fluent Cranial nerve II-XII: Pupils were equal round reactive to light. Extraocular movements were full, visual field were full on confrontational test. Facial sensation and strength were normal.  Head turning and shoulder shrug  were normal and symmetric. Motor: The motor testing reveals 5 over 5 strength of all 4 extremities. Good symmetric motor tone is noted throughout.  Sensory: Sensory testing is intact to soft touch on all 4 extremities. No evidence of extinction is noted.  Gait: In wheelchair    DIAGNOSTIC DATA (LABS, IMAGING, TESTING) - I reviewed patient records, labs, notes, testing and imaging myself where available.  Lab Results  Component Value Date   WBC 7.5 08/21/2023   HGB 13.4 08/21/2023   HCT 40.6 08/21/2023   MCV 91.9 08/21/2023   PLT 317 08/21/2023      Component Value Date/Time   NA 139 08/21/2023 0506   NA 140 09/07/2021 1011   K 3.1 (L) 08/21/2023 0506   CL 98 08/21/2023 0506   CO2 30 08/21/2023 0506   GLUCOSE 160 (H) 08/21/2023  0506   BUN 10 08/21/2023 0506   BUN 11 09/07/2021 1011   CREATININE 0.73 08/21/2023 0506   CALCIUM  9.5 08/21/2023 0506   PROT 7.1 08/21/2023 0506   PROT 7.1 09/07/2021 1011   ALBUMIN 3.7 08/21/2023 0506   ALBUMIN 4.5 09/07/2021 1011   AST 23 08/21/2023 0506   ALT 16 08/21/2023 0506   ALKPHOS 79 08/21/2023 0506   BILITOT 1.1 08/21/2023 0506   BILITOT 1.3 (H) 09/07/2021 1011   GFRNONAA >60 08/21/2023 0506   GFRAA >60 01/26/2017 1640   Lab Results  Component Value Date   CHOL 202 (H) 04/15/2016   HDL 75.50 04/15/2016   LDLCALC 116 (H) 04/15/2016   LDLDIRECT 129.3 12/07/2010   TRIG 58 11/26/2020   CHOLHDL 3 04/15/2016   Lab Results  Component  Value Date   HGBA1C 5.5 11/25/2020   Lab Results  Component Value Date   VITAMINB12 347 02/01/2021   Lab Results  Component Value Date   TSH 2.600 02/01/2021      ASSESSMENT AND PLAN 83 y.o. year old female  has a past medical history of Allergy, Asthma, At high risk for falls, Ataxia, Cataract, Diverticulosis, Emphysema, Gait abnormality (05/14/2013), GERD (gastroesophageal reflux disease), Hepatic steatosis, Hyperlipidemia, Hypertension, Iron deficiency anemia, unspecified, Kidney cysts (11/16/12), Nystagmus, end-position, Osteopenia, Renal cyst, and Stroke (HCC) (01/2017). here with :  Seizures  Continue Keppra  500 mg twice a day  2.  Memory disturbance  MMSE 18/30 previously 21/30 Continue Namenda  10 mg BID   FU In 1 year or sooner if needed      Duwaine Russell, MSN, NP-C 08/08/2024, 1:39 PM [facility] [address] [place], [place] [address] [phone]  The patient's condition requires frequent monitoring and adjustments in the treatment plan, reflecting the ongoing complexity of care.  This provider is the continuing focal point for all needed services for this condition.

## 2024-08-08 NOTE — Patient Instructions (Addendum)
 Your Plan:  Continue Namenda  10 mg twice a day Continue Keppra  500 mg twice a day If your symptoms worsen or you develop new symptoms please let us  know.    Thank you for coming to see us  at [facility]. I hope we have been able to provide you high quality care today.  You may receive a patient satisfaction survey over the next few weeks. We would appreciate your feedback and comments so that we may continue to improve ourselves and the health of our patients.

## 2024-09-11 ENCOUNTER — Other Ambulatory Visit: Payer: Self-pay | Admitting: Adult Health

## 2024-10-04 ENCOUNTER — Other Ambulatory Visit: Payer: Self-pay | Admitting: Adult Health

## 2025-08-07 ENCOUNTER — Ambulatory Visit: Admitting: Adult Health
